# Patient Record
Sex: Female | Born: 1950
Health system: Southern US, Community
[De-identification: ages and names within clinical notes are randomized; demographics above are authoritative.]

## PROBLEM LIST (undated history)

## (undated) DIAGNOSIS — R002 Palpitations: Secondary | ICD-10-CM

## (undated) DIAGNOSIS — M1990 Unspecified osteoarthritis, unspecified site: Secondary | ICD-10-CM

## (undated) DIAGNOSIS — E785 Hyperlipidemia, unspecified: Secondary | ICD-10-CM

## (undated) DIAGNOSIS — Z923 Personal history of irradiation: Secondary | ICD-10-CM

## (undated) DIAGNOSIS — H9190 Unspecified hearing loss, unspecified ear: Secondary | ICD-10-CM

## (undated) DIAGNOSIS — K219 Gastro-esophageal reflux disease without esophagitis: Secondary | ICD-10-CM

## (undated) DIAGNOSIS — Z9119 Patient's noncompliance with other medical treatment and regimen: Secondary | ICD-10-CM

## (undated) DIAGNOSIS — C50412 Malignant neoplasm of upper-outer quadrant of left female breast: Secondary | ICD-10-CM

## (undated) DIAGNOSIS — K635 Polyp of colon: Secondary | ICD-10-CM

## (undated) DIAGNOSIS — E049 Nontoxic goiter, unspecified: Secondary | ICD-10-CM

## (undated) DIAGNOSIS — I2692 Saddle embolus of pulmonary artery without acute cor pulmonale: Secondary | ICD-10-CM

## (undated) DIAGNOSIS — G4733 Obstructive sleep apnea (adult) (pediatric): Secondary | ICD-10-CM

## (undated) DIAGNOSIS — I209 Angina pectoris, unspecified: Secondary | ICD-10-CM

## (undated) DIAGNOSIS — I10 Essential (primary) hypertension: Secondary | ICD-10-CM

## (undated) HISTORY — DX: Hyperlipidemia, unspecified: E78.5

## (undated) HISTORY — DX: Patient's noncompliance with other medical treatment and regimen: Z91.19

## (undated) HISTORY — DX: Morbid (severe) obesity due to excess calories: E66.01

## (undated) HISTORY — DX: Gastro-esophageal reflux disease without esophagitis: K21.9

## (undated) HISTORY — DX: Saddle embolus of pulmonary artery without acute cor pulmonale: I26.92

## (undated) HISTORY — DX: Palpitations: R00.2

## (undated) HISTORY — PX: TUBAL LIGATION: SHX77

## (undated) HISTORY — DX: Malignant neoplasm of upper-outer quadrant of left female breast: C50.412

## (undated) HISTORY — DX: Obstructive sleep apnea (adult) (pediatric): G47.33

## (undated) HISTORY — DX: Polyp of colon: K63.5

---

## 1998-09-13 ENCOUNTER — Ambulatory Visit (HOSPITAL_COMMUNITY): Admission: RE | Admit: 1998-09-13 | Discharge: 1998-09-13 | Payer: Self-pay | Admitting: Cardiology

## 1998-09-14 ENCOUNTER — Encounter: Payer: Self-pay | Admitting: Cardiology

## 1999-11-21 ENCOUNTER — Emergency Department (HOSPITAL_COMMUNITY): Admission: EM | Admit: 1999-11-21 | Discharge: 1999-11-22 | Payer: Self-pay | Admitting: Emergency Medicine

## 2001-07-03 ENCOUNTER — Ambulatory Visit (HOSPITAL_COMMUNITY): Admission: RE | Admit: 2001-07-03 | Discharge: 2001-07-03 | Payer: Self-pay | Admitting: Cardiology

## 2001-07-04 ENCOUNTER — Encounter: Payer: Self-pay | Admitting: Cardiology

## 2001-07-24 ENCOUNTER — Ambulatory Visit (HOSPITAL_COMMUNITY): Admission: RE | Admit: 2001-07-24 | Discharge: 2001-07-24 | Payer: Self-pay | Admitting: Cardiology

## 2001-11-16 ENCOUNTER — Emergency Department (HOSPITAL_COMMUNITY): Admission: EM | Admit: 2001-11-16 | Discharge: 2001-11-16 | Payer: Self-pay | Admitting: Emergency Medicine

## 2002-04-02 ENCOUNTER — Emergency Department (HOSPITAL_COMMUNITY): Admission: EM | Admit: 2002-04-02 | Discharge: 2002-04-02 | Payer: Self-pay | Admitting: Emergency Medicine

## 2002-04-02 ENCOUNTER — Encounter: Payer: Self-pay | Admitting: Emergency Medicine

## 2002-04-15 ENCOUNTER — Emergency Department (HOSPITAL_COMMUNITY): Admission: EM | Admit: 2002-04-15 | Discharge: 2002-04-15 | Payer: Self-pay | Admitting: *Deleted

## 2002-07-28 ENCOUNTER — Ambulatory Visit (HOSPITAL_COMMUNITY): Admission: RE | Admit: 2002-07-28 | Discharge: 2002-07-28 | Payer: Self-pay | Admitting: Family Medicine

## 2002-07-28 ENCOUNTER — Encounter: Payer: Self-pay | Admitting: Family Medicine

## 2003-05-25 ENCOUNTER — Encounter: Payer: Self-pay | Admitting: Emergency Medicine

## 2003-05-25 ENCOUNTER — Inpatient Hospital Stay (HOSPITAL_COMMUNITY): Admission: EM | Admit: 2003-05-25 | Discharge: 2003-05-27 | Payer: Self-pay | Admitting: Emergency Medicine

## 2003-05-27 ENCOUNTER — Encounter (INDEPENDENT_AMBULATORY_CARE_PROVIDER_SITE_OTHER): Payer: Self-pay | Admitting: Cardiology

## 2003-05-27 ENCOUNTER — Encounter: Payer: Self-pay | Admitting: Internal Medicine

## 2003-08-16 ENCOUNTER — Emergency Department (HOSPITAL_COMMUNITY): Admission: EM | Admit: 2003-08-16 | Discharge: 2003-08-16 | Payer: Self-pay | Admitting: Emergency Medicine

## 2003-12-02 IMAGING — CR DG CHEST 2V
2 series · 2 of 2 positions shown · non-contrast
Comparison: Two view chest x-ray [DATE].

CLINICAL DATA: Chest pain.  Shortness of breath. 
CHEST (TWO VIEWS) [DATE]

[view not recorded (1 of 2)]
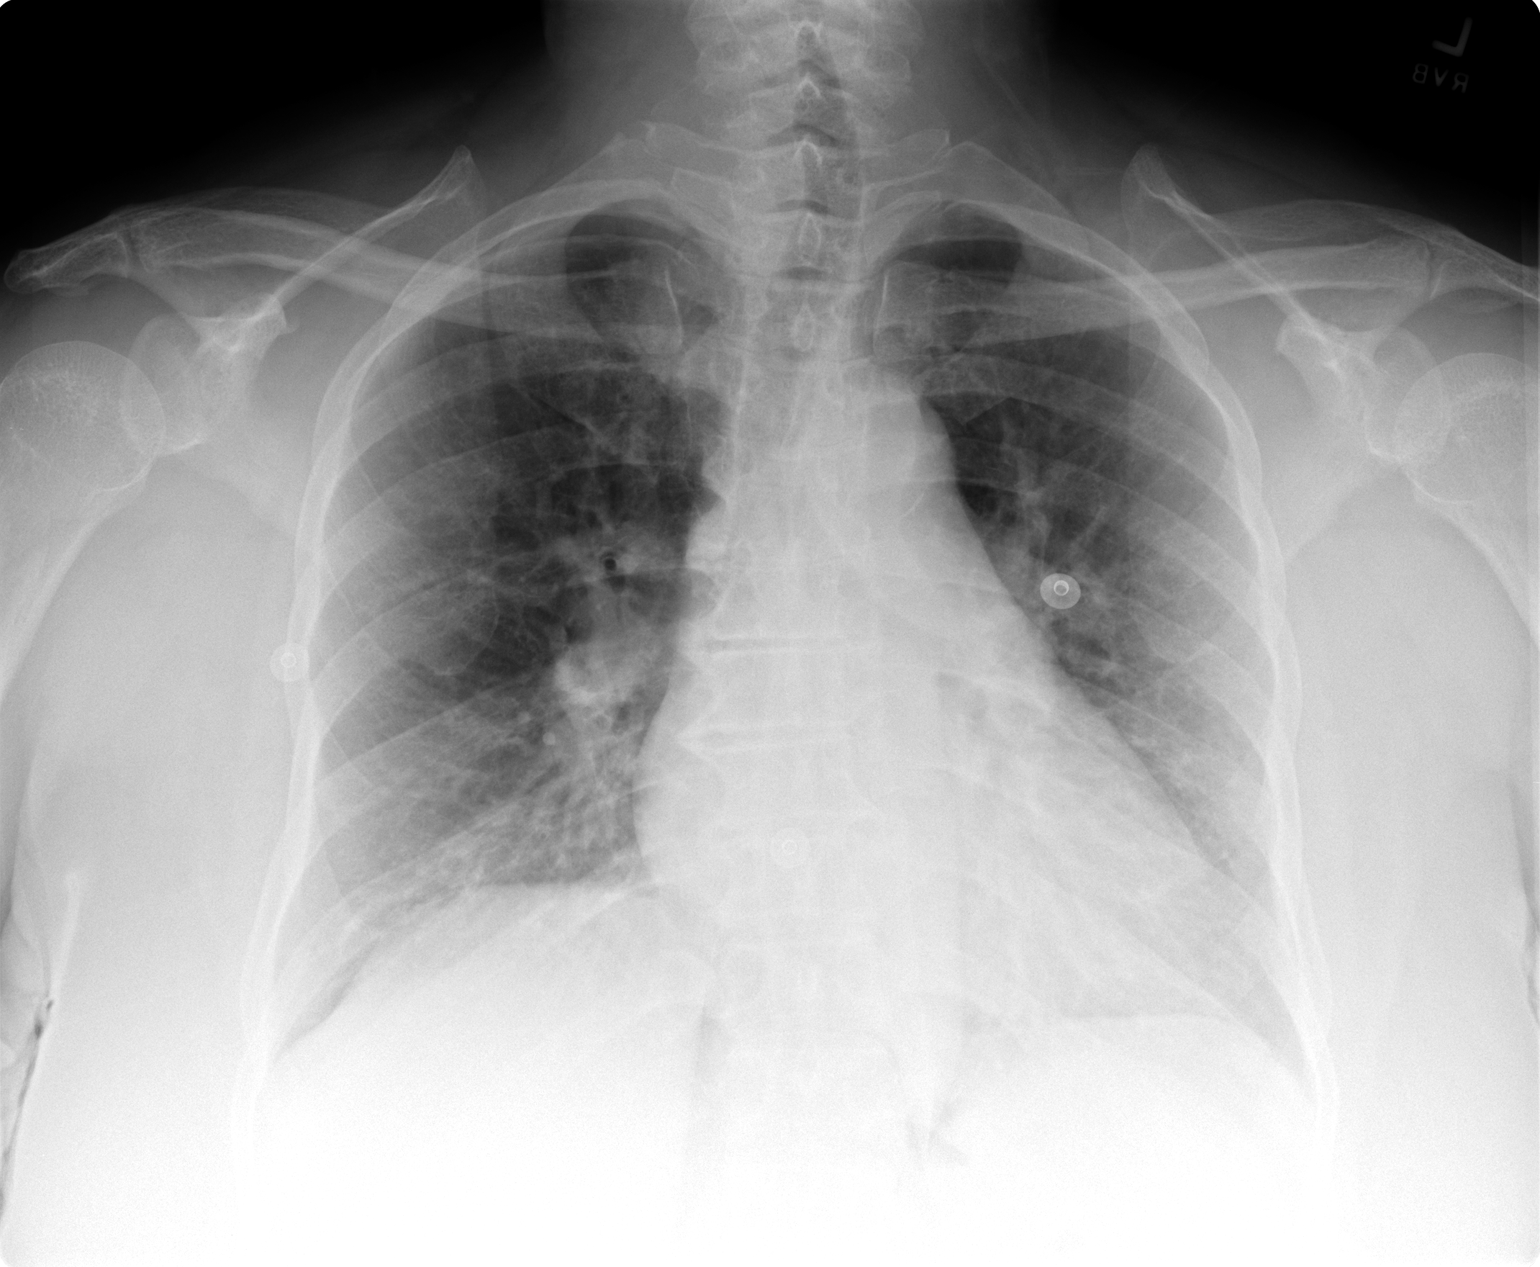

[view not recorded (2 of 2)]
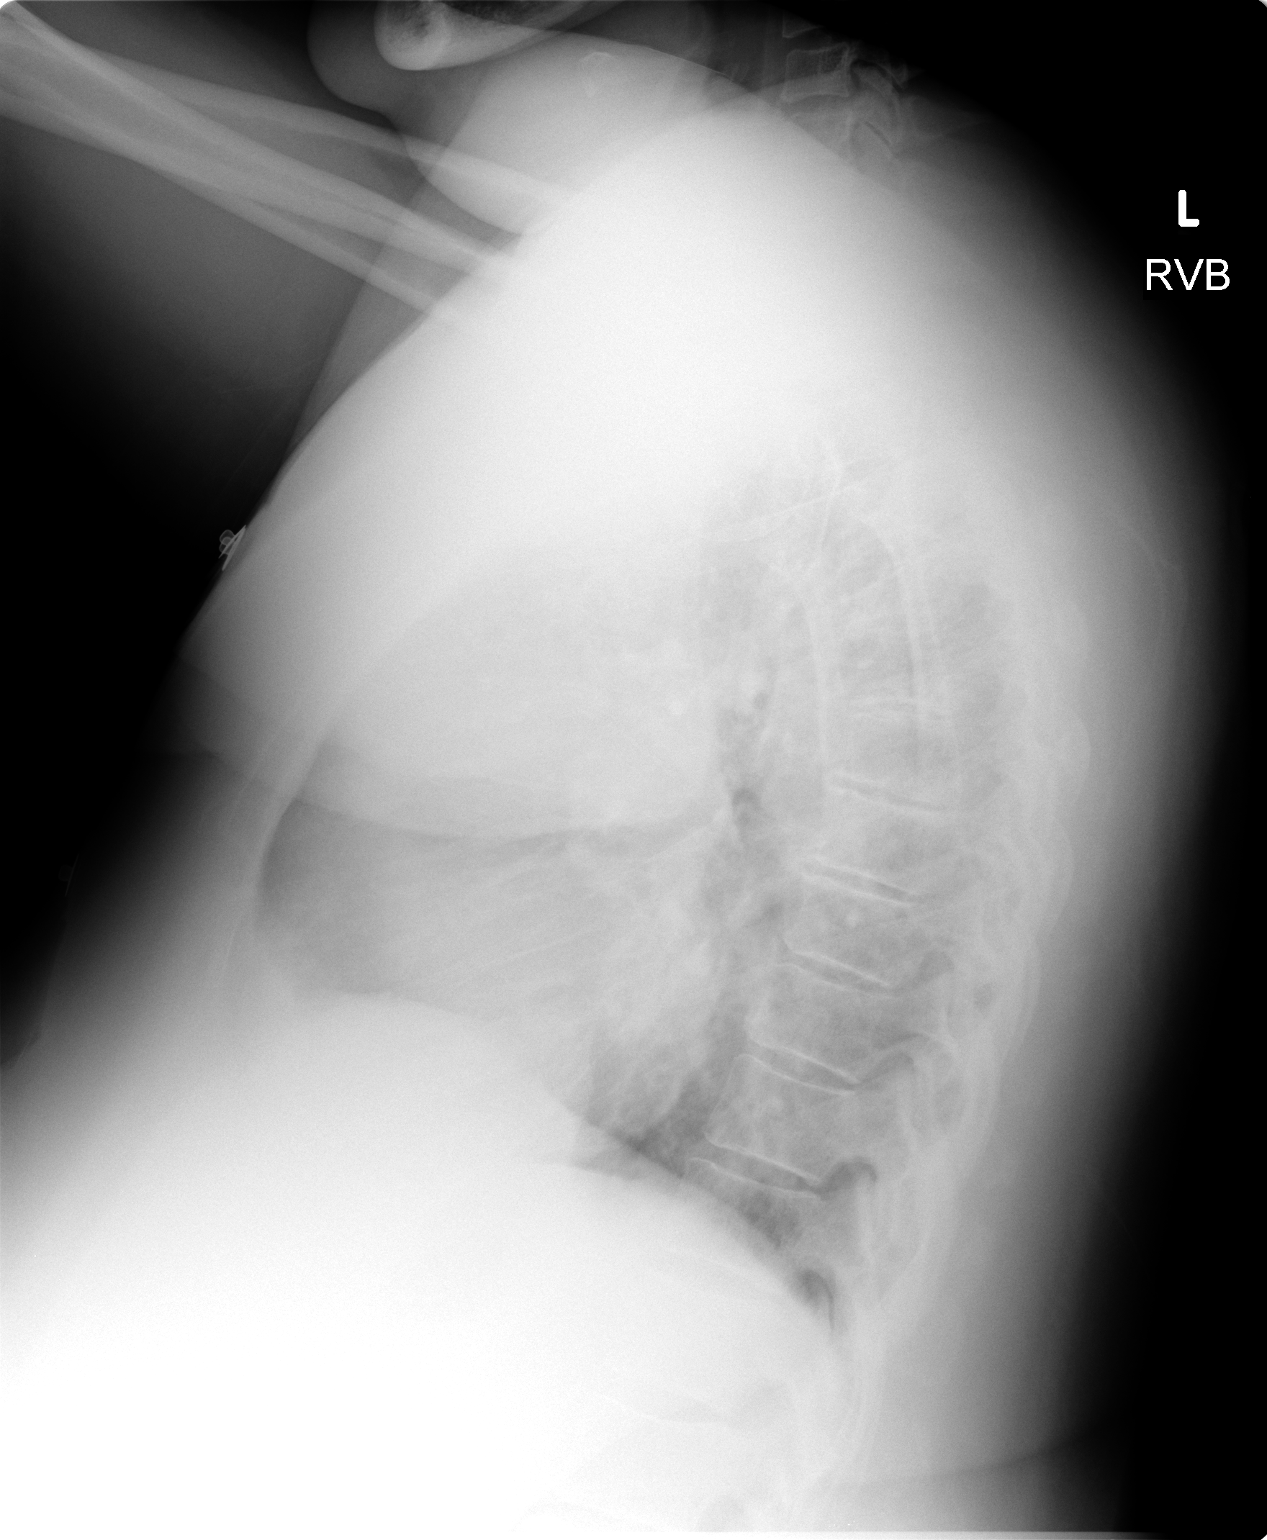

[2 of 2 positions shown; findings below may reference images not displayed]

FINDINGS: The heart is mildly enlarged.  Pulmonary venous hypertension is present without overt edema.  There are no confluent areas of consolidation.  There are no pleural effusions.  Degenerative changes are again noted in the thoracic spine. 
IMPRESSION
Mild cardiomegaly.  Pulmonary venous hypertension without overt edema.

## 2004-01-26 ENCOUNTER — Ambulatory Visit (HOSPITAL_COMMUNITY): Admission: RE | Admit: 2004-01-26 | Discharge: 2004-01-26 | Payer: Self-pay | Admitting: Family Medicine

## 2004-02-03 ENCOUNTER — Encounter: Admission: RE | Admit: 2004-02-03 | Discharge: 2004-02-03 | Payer: Self-pay | Admitting: Family Medicine

## 2004-02-20 ENCOUNTER — Emergency Department (HOSPITAL_COMMUNITY): Admission: EM | Admit: 2004-02-20 | Discharge: 2004-02-20 | Payer: Self-pay | Admitting: Emergency Medicine

## 2004-02-20 IMAGING — CR DG CHEST 2V
2 series · 2 of 2 positions shown · non-contrast
Comparison: [DATE].

CLINICAL DATA: Left chest pain for the past week. 
 PA AND LATERAL CHEST

[view not recorded (1 of 2)]
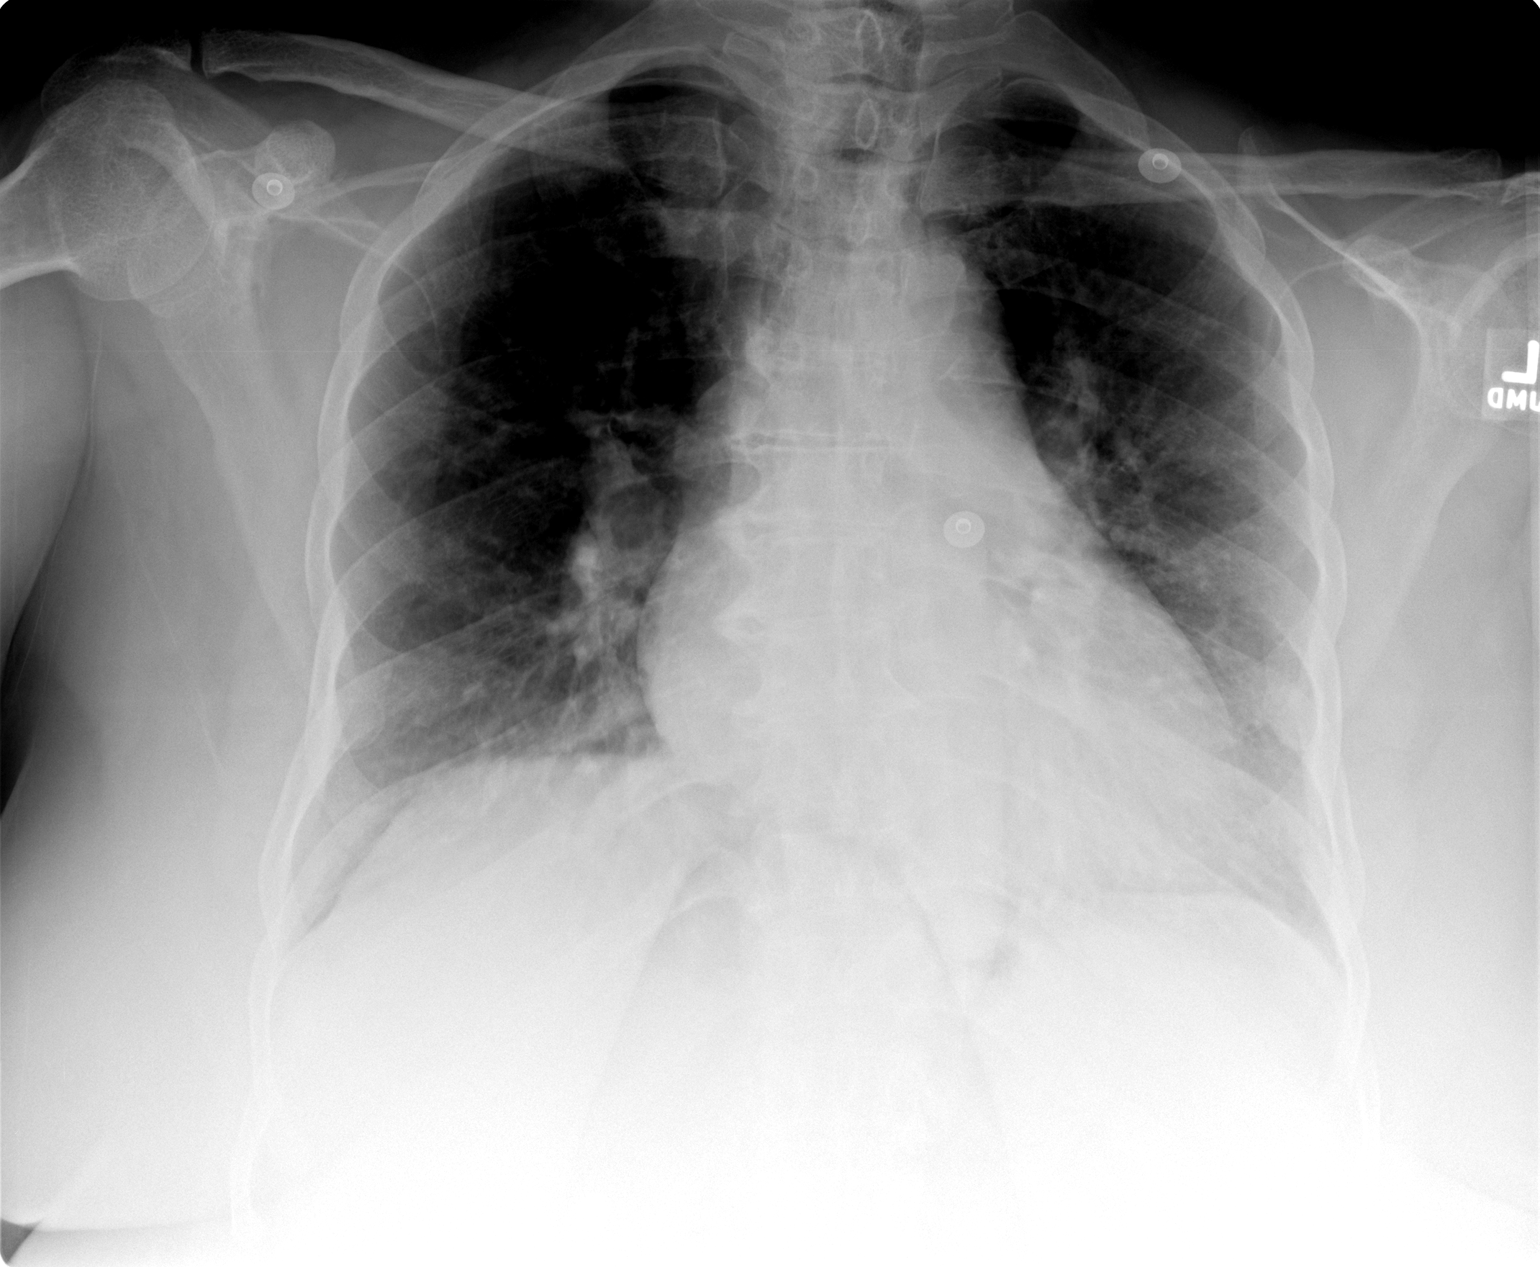

[view not recorded (2 of 2)]
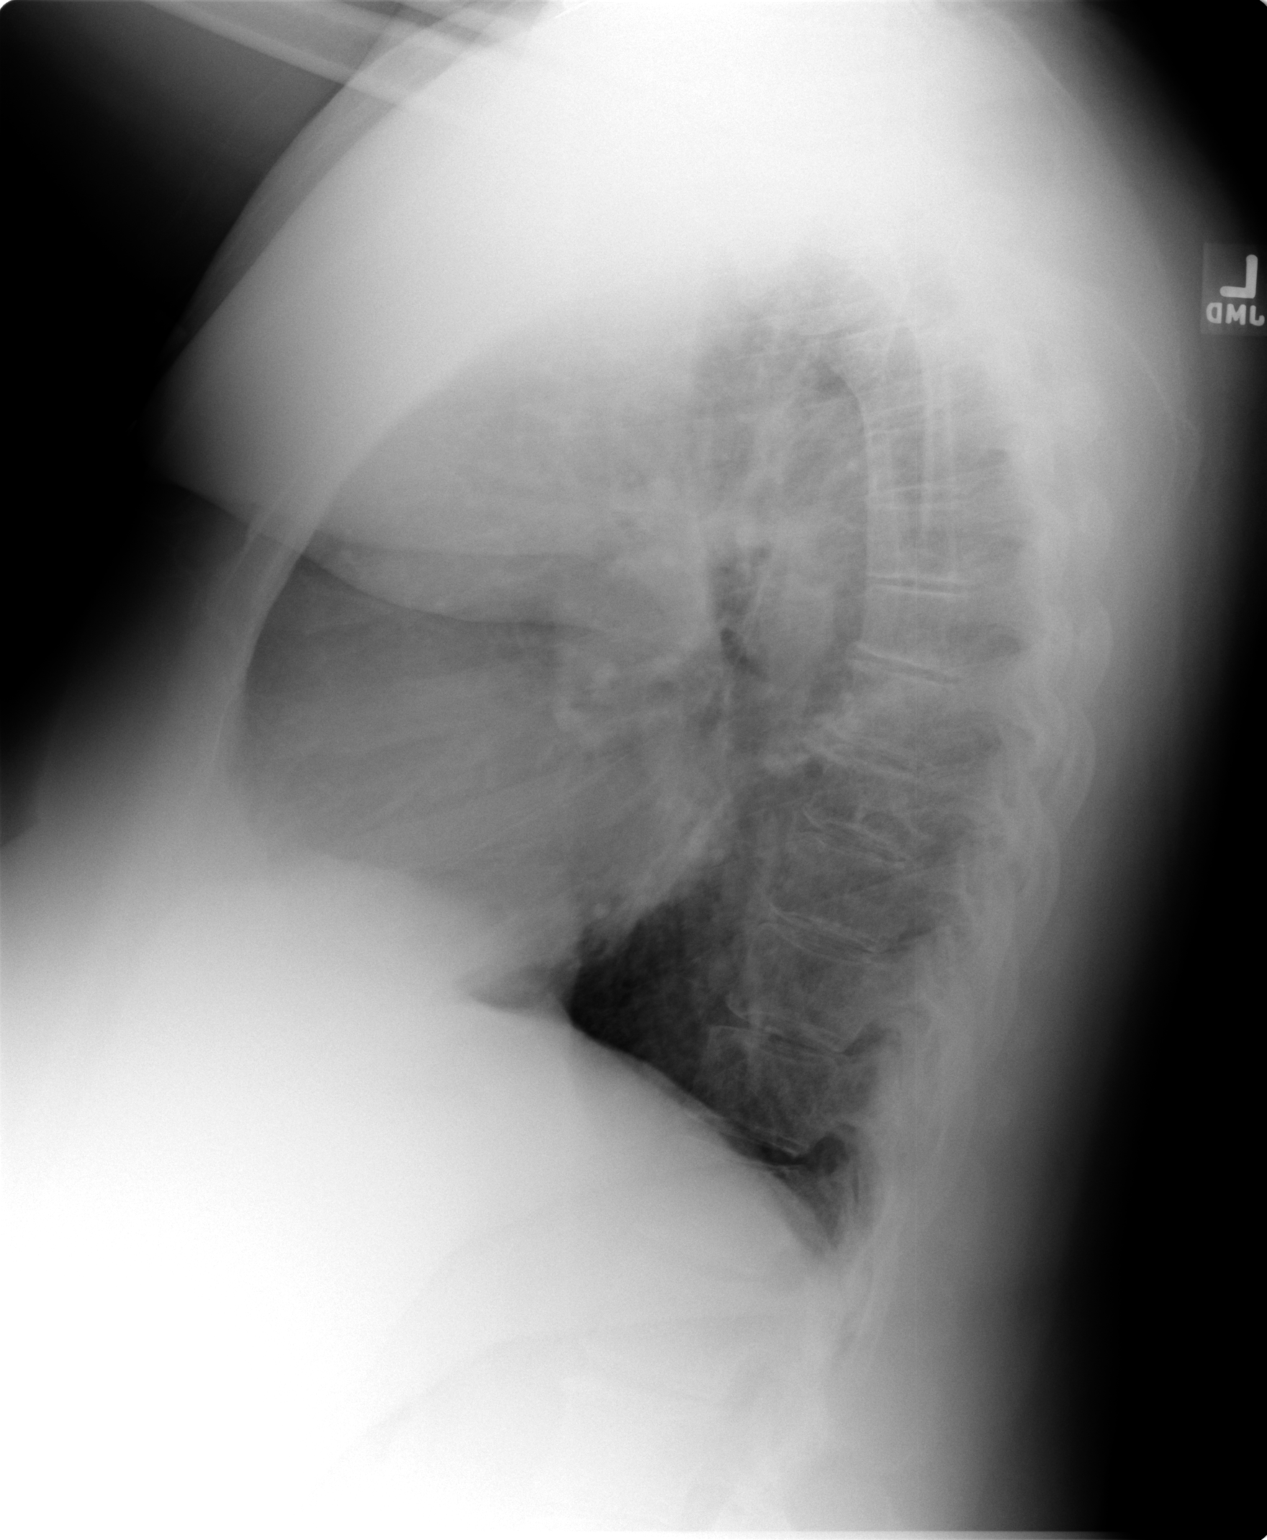

[2 of 2 positions shown; findings below may reference images not displayed]

FINDINGS: Cardiomegaly.  Central pulmonary vascular prominence.  No segmental infiltrate or congestive heart failure.  If thoracic aortic abnormality or pulmonary embolus were of high clinical concern, CT of the chest may be considered for further delineation.  This would also help to evaluate for the possibility of hilar adenopathy as the pulmonary vascular prominence limits plain film detection of such.  
 IMPRESSION
 Cardiomegaly and central pulmonary vascular prominence as discussed above.

## 2004-04-29 ENCOUNTER — Emergency Department (HOSPITAL_COMMUNITY): Admission: EM | Admit: 2004-04-29 | Discharge: 2004-04-29 | Payer: Self-pay | Admitting: Emergency Medicine

## 2004-05-10 ENCOUNTER — Emergency Department (HOSPITAL_COMMUNITY): Admission: EM | Admit: 2004-05-10 | Discharge: 2004-05-10 | Payer: Self-pay | Admitting: Emergency Medicine

## 2004-05-10 IMAGING — CR DG CHEST 1V PORT
1 series · 1 of 1 positions shown · non-contrast
Comparison: none

CLINICAL DATA: Chest pain. 
 PORTABLE CHEST, ONE VIEW 
 AP view of the chest at [MB] hours is compared to views of [DATE] reveals the heart size to be prominent.  Pulmonary vascularity remains accentuated without definite pulmonary edema.  
 IMPRESSION 
 Heart size is prominent.  
 No active disease.

[view not recorded]
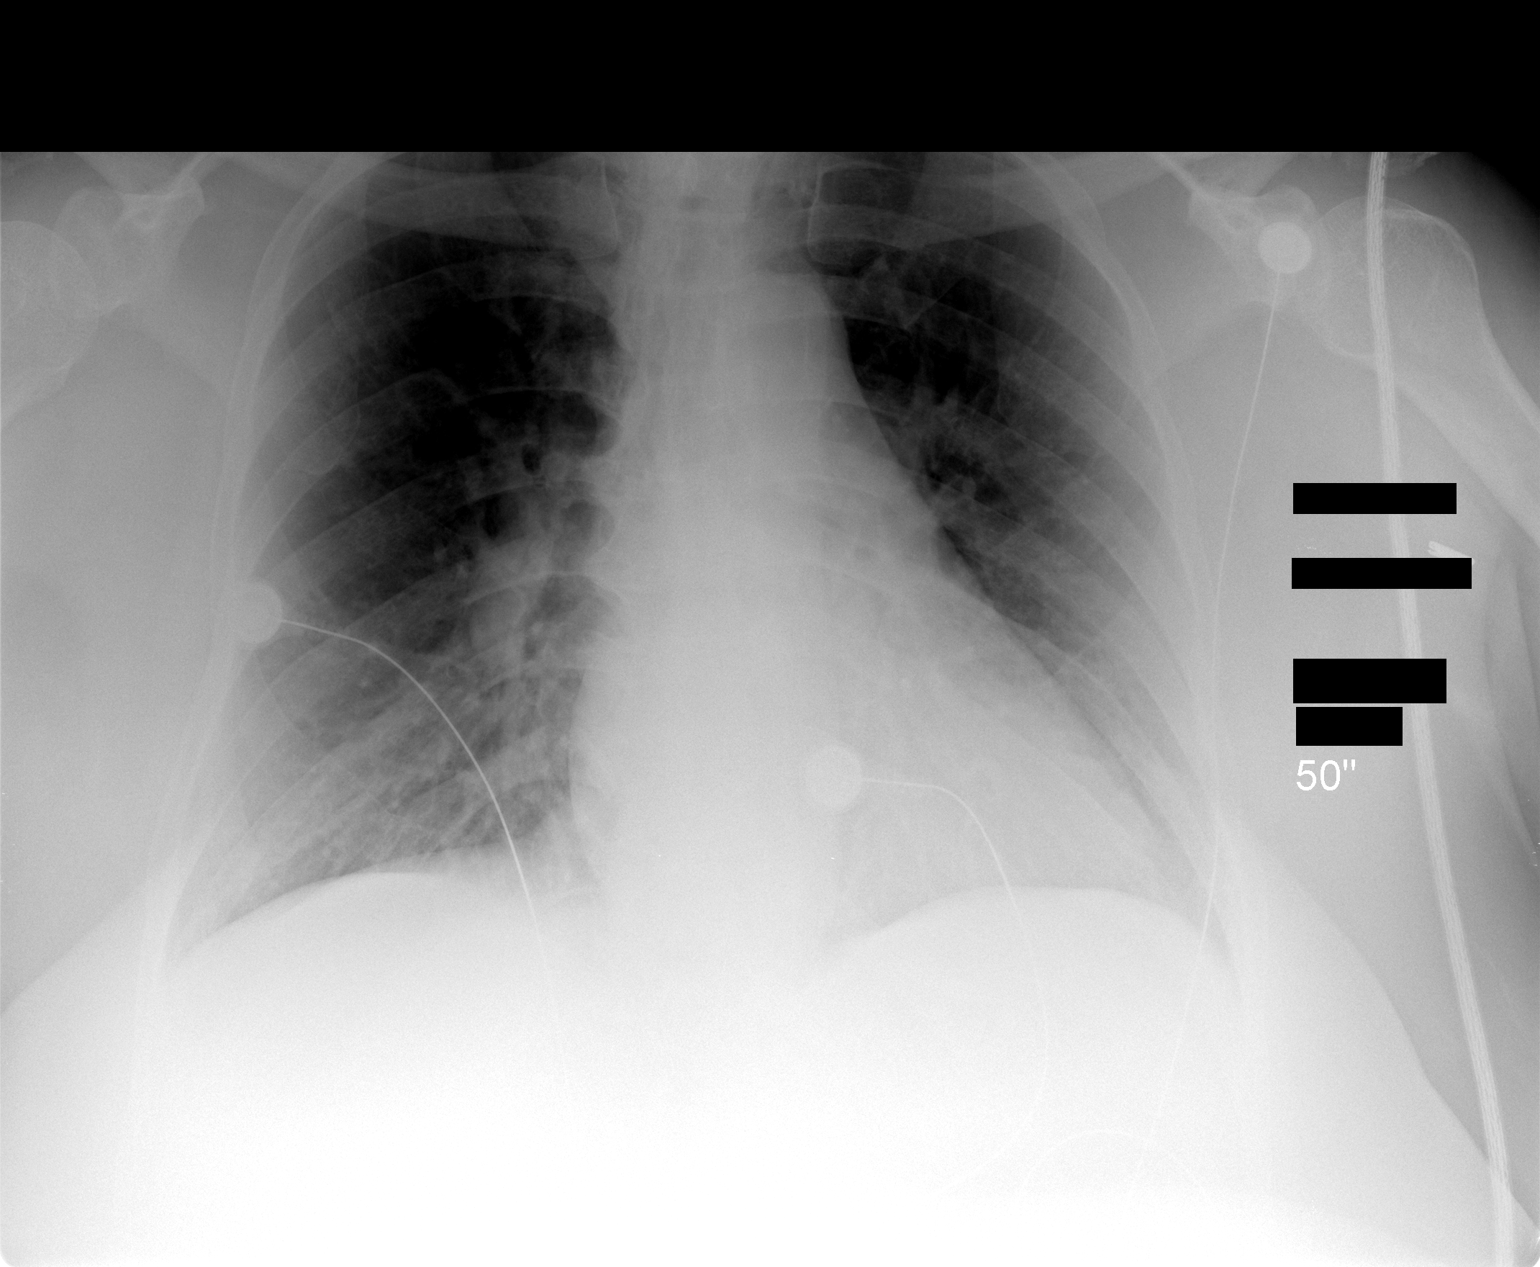

[1 of 1 positions shown; findings below may reference images not displayed]

## 2004-05-30 ENCOUNTER — Ambulatory Visit: Payer: Self-pay | Admitting: Family Medicine

## 2004-06-16 ENCOUNTER — Ambulatory Visit: Payer: Self-pay | Admitting: Family Medicine

## 2004-06-16 ENCOUNTER — Ambulatory Visit: Payer: Self-pay | Admitting: *Deleted

## 2004-06-26 ENCOUNTER — Ambulatory Visit: Payer: Self-pay | Admitting: Family Medicine

## 2004-07-24 ENCOUNTER — Encounter: Admission: RE | Admit: 2004-07-24 | Discharge: 2004-07-24 | Payer: Self-pay | Admitting: Family Medicine

## 2004-08-14 ENCOUNTER — Ambulatory Visit: Payer: Self-pay | Admitting: Family Medicine

## 2005-03-26 ENCOUNTER — Ambulatory Visit: Payer: Self-pay | Admitting: Family Medicine

## 2005-04-09 ENCOUNTER — Encounter: Admission: RE | Admit: 2005-04-09 | Discharge: 2005-04-09 | Payer: Self-pay | Admitting: Family Medicine

## 2005-04-23 ENCOUNTER — Ambulatory Visit: Payer: Self-pay | Admitting: Family Medicine

## 2005-04-25 ENCOUNTER — Ambulatory Visit: Payer: Self-pay | Admitting: Family Medicine

## 2005-10-05 ENCOUNTER — Ambulatory Visit: Payer: Self-pay | Admitting: Family Medicine

## 2006-04-12 ENCOUNTER — Encounter: Admission: RE | Admit: 2006-04-12 | Discharge: 2006-04-12 | Payer: Self-pay | Admitting: Family Medicine

## 2006-04-12 IMAGING — MG MM SCREEN MAMMOGRAM BILATERAL
8 of 12 series · 8 of 12 positions shown · non-contrast
Comparison: none

DG SCREEN MAMMOGRAM BILATERAL
Bilateral CC and MLO view(s) were taken.

SCREENING MAMMOGRAM:
There is a fibrofatty pattern.  No masses or malignant type calcifications are identified.  
Compared with prior studies.

[R CC (1 of 3)]
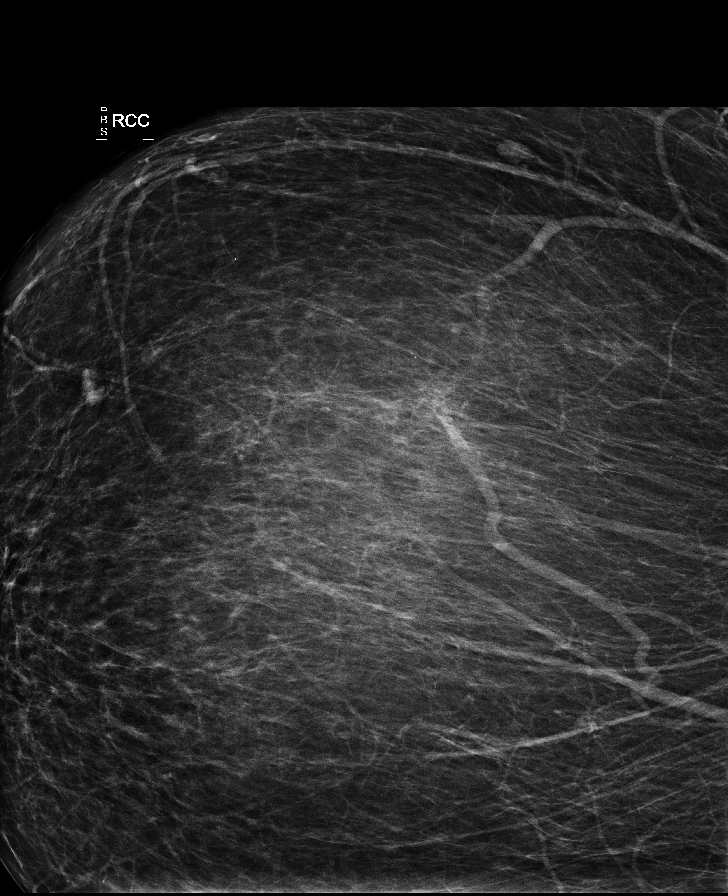

[L CC (1 of 3)]
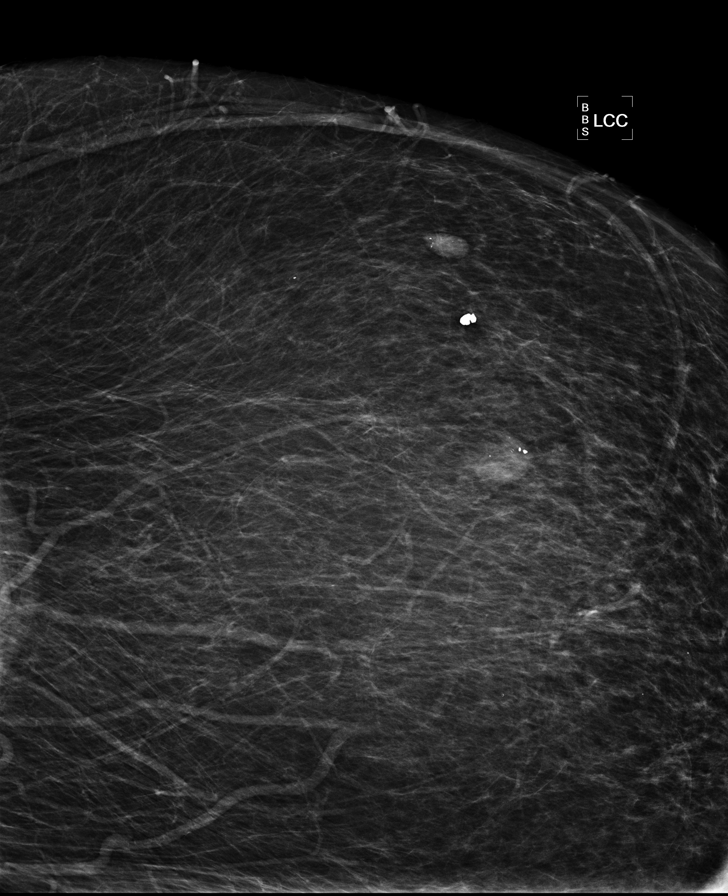

[L MLO]
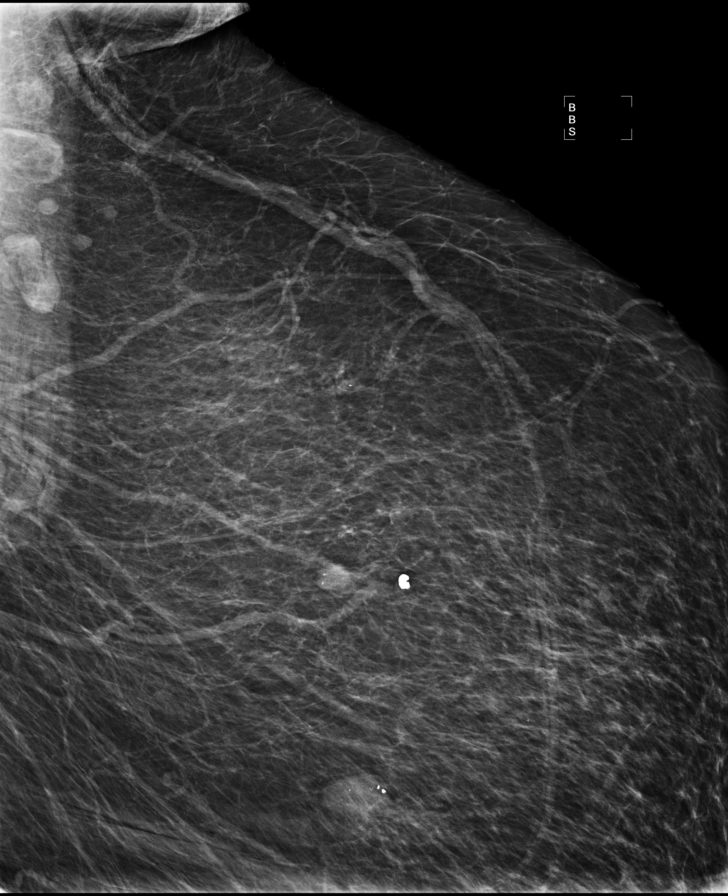

[R MLO]
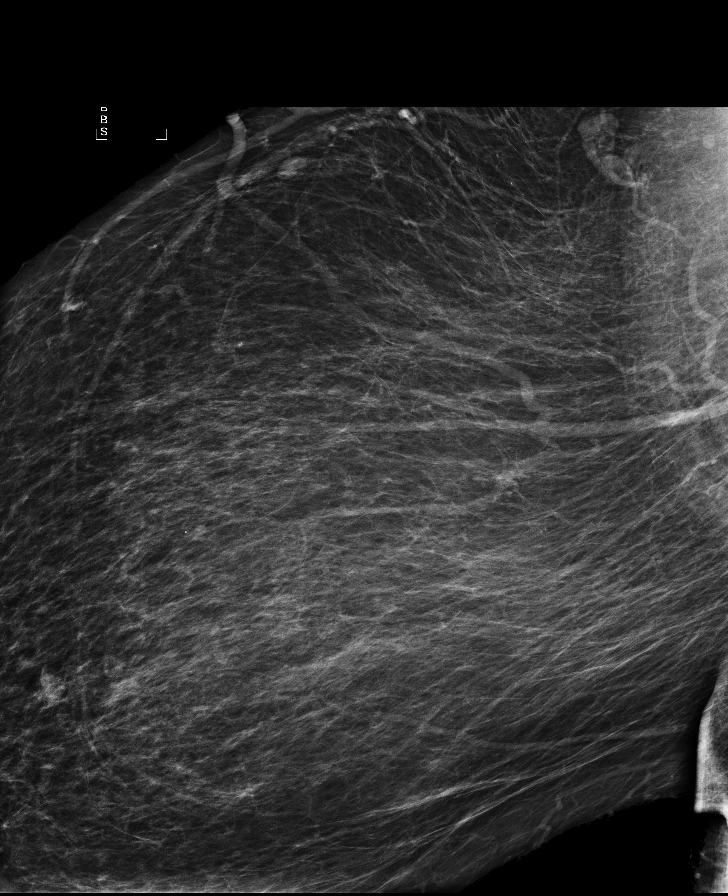

[L CC (2 of 3)]
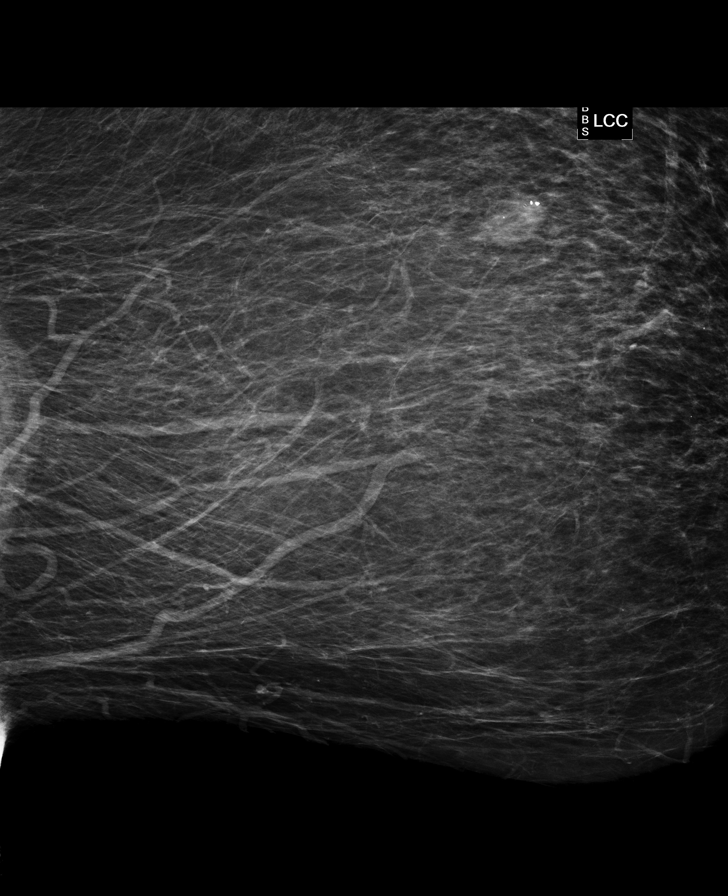

[L CC (3 of 3)]
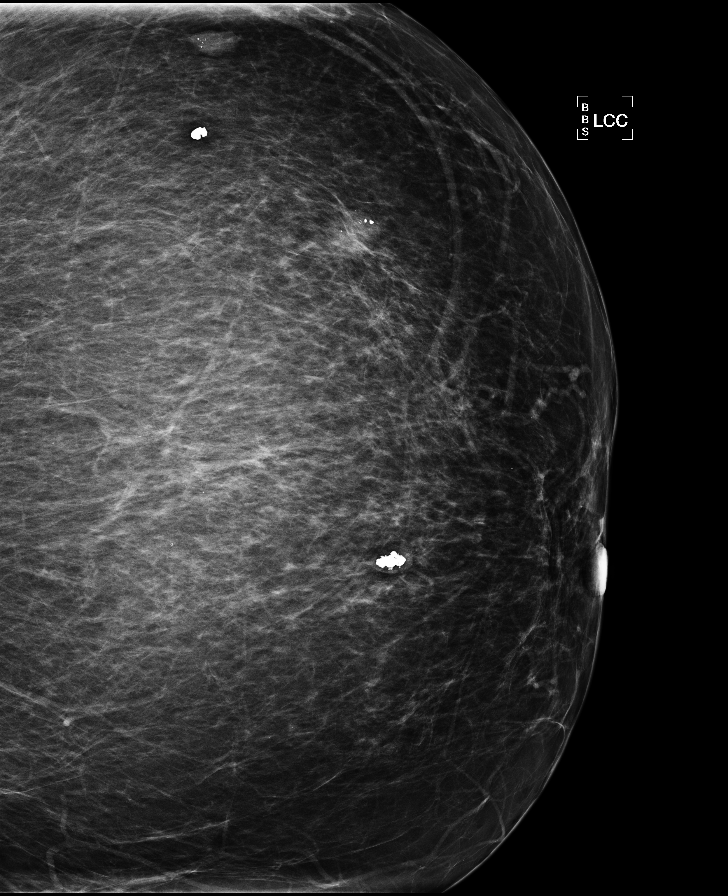

[R CC (2 of 3)]
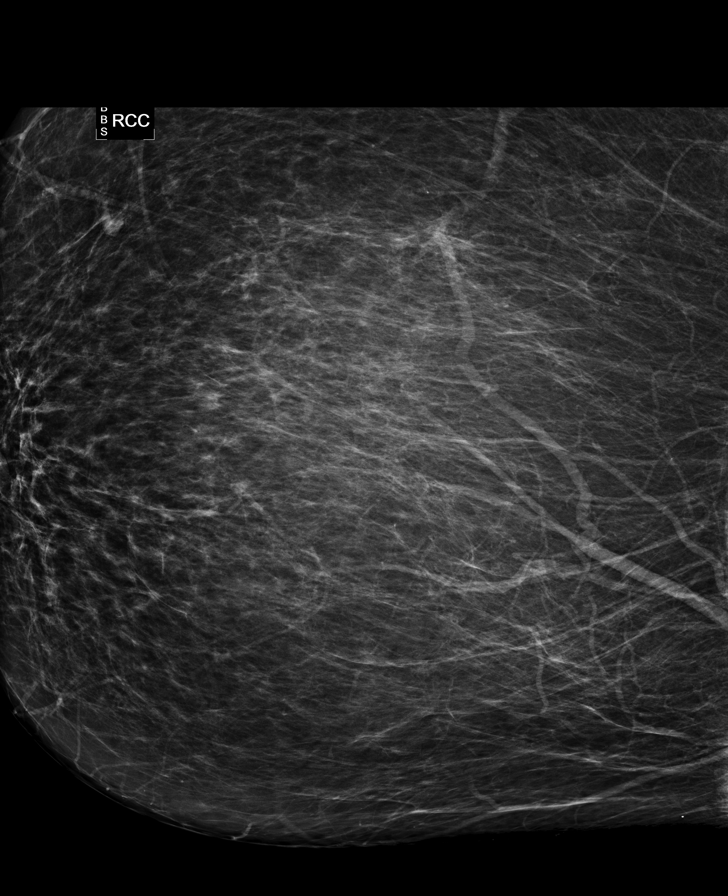

[R CC (3 of 3)]
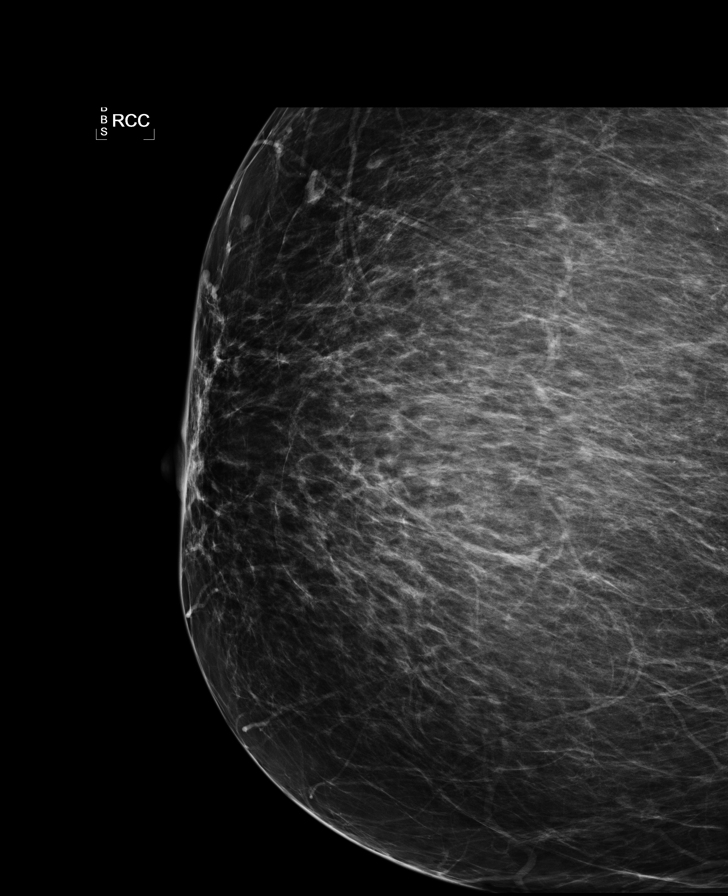

[8 of 12 positions shown; findings below may reference images not displayed]

IMPRESSION: No specific mammographic evidence of malignancy.  Next screening mammogram is recommended in one 
year.

ASSESSMENT: Negative - BI-RADS 1

Screening mammogram in 1 year.
ANALYZED BY COMPUTER AIDED DETECTION. , THIS PROCEDURE WAS A DIGITAL MAMMOGRAM.

## 2006-05-01 ENCOUNTER — Ambulatory Visit: Payer: Self-pay | Admitting: Family Medicine

## 2006-06-27 ENCOUNTER — Emergency Department (HOSPITAL_COMMUNITY): Admission: EM | Admit: 2006-06-27 | Discharge: 2006-06-27 | Payer: Self-pay | Admitting: Emergency Medicine

## 2006-06-27 IMAGING — CR DG LUMBAR SPINE COMPLETE 4+V
6 series · 6 of 6 positions shown · non-contrast
Comparison: none

CLINICAL DATA: Leg pain. Back pain.
LUMBAR SPINE ? 6 VIEW:

[t l-spine a.p. *]
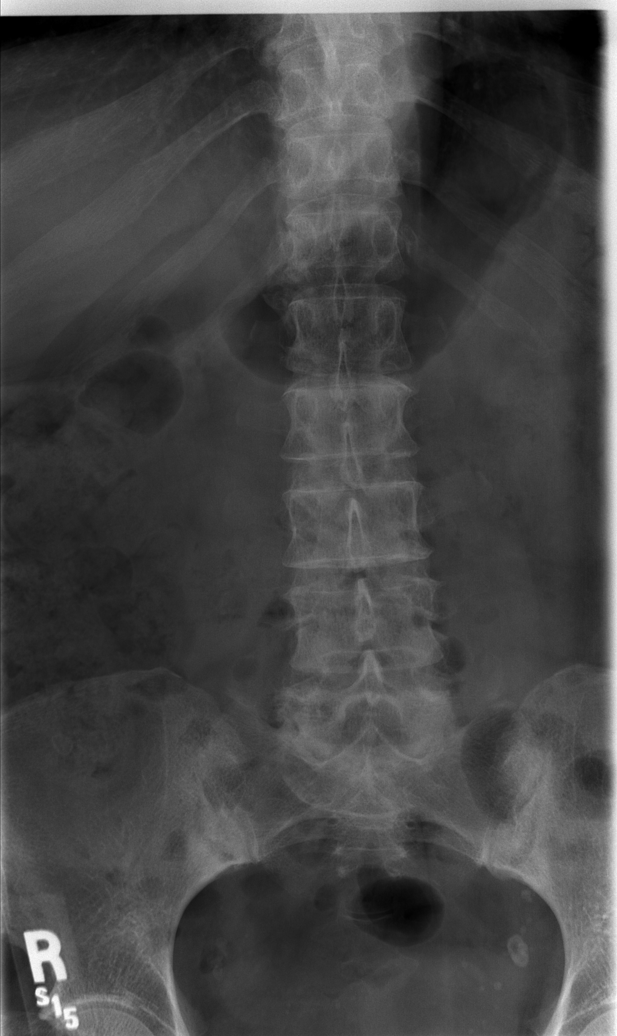

[t l-spine oblique exposure * (1 of 2)]
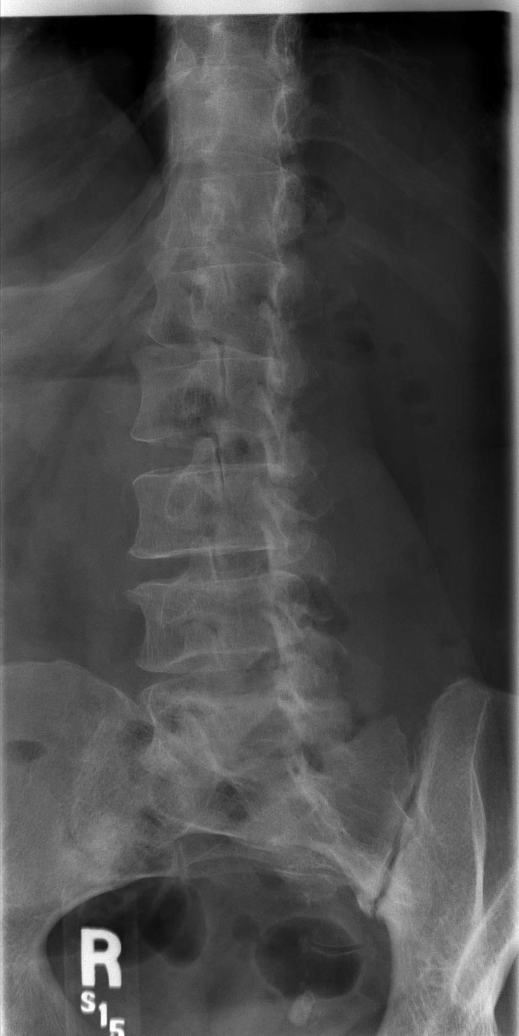

[t l-spine oblique exposure * (2 of 2)]
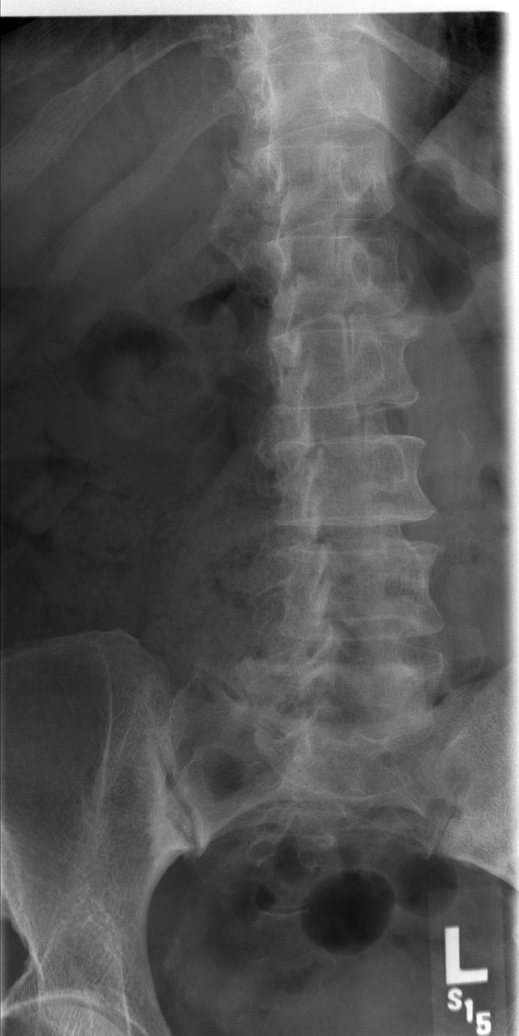

[t l-spine lat * (1 of 2)]
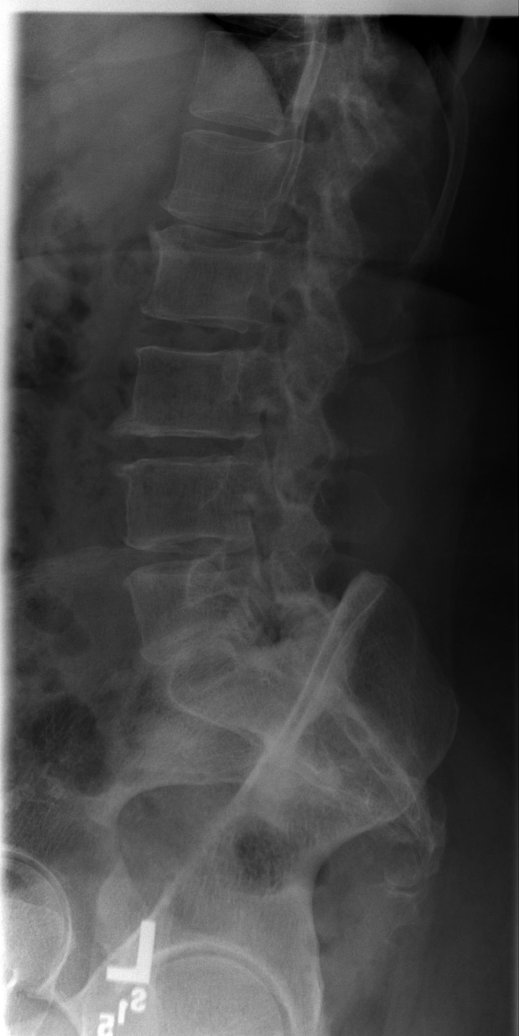

[t l-spine lat * (2 of 2)]
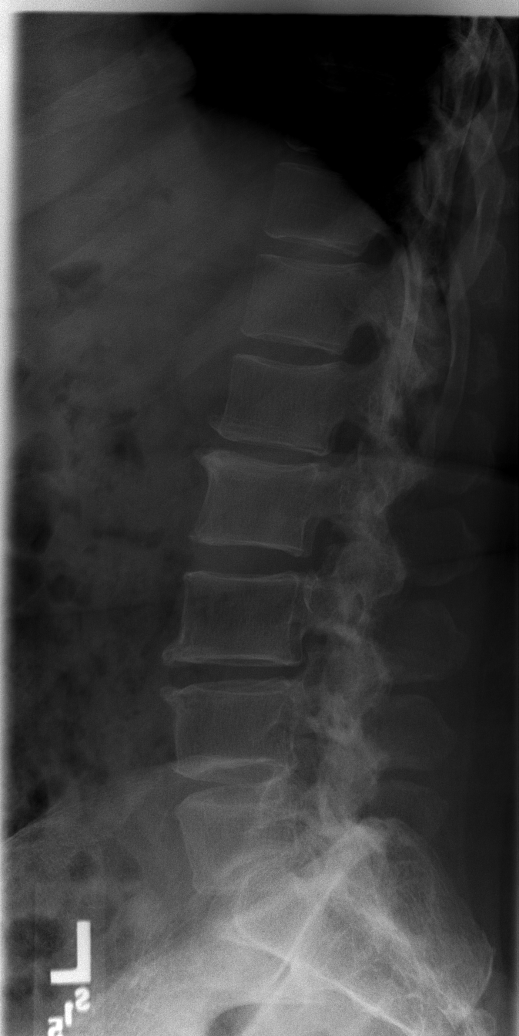

[t l-spine l5-s1 spot *]
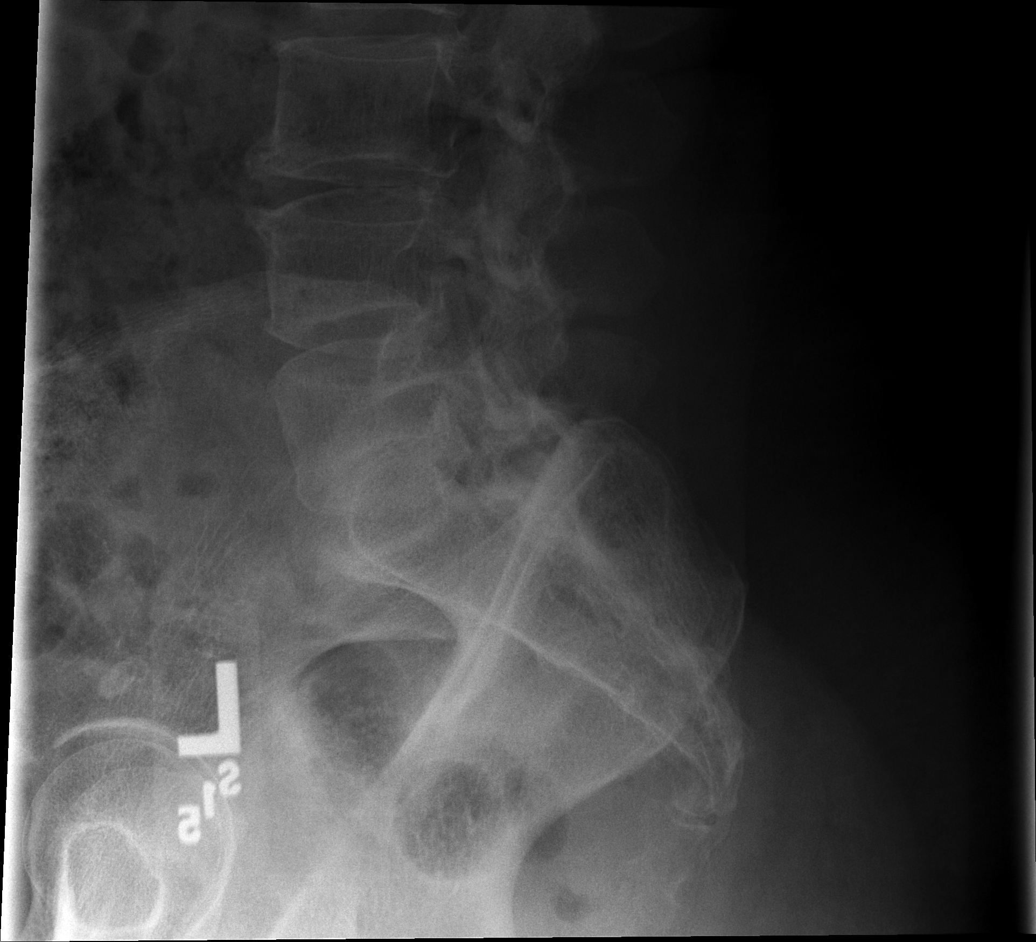

[6 of 6 positions shown; findings below may reference images not displayed]

FINDINGS: Degenerative changes SI joints. Disk space narrowing and vertebral body osteophytic formation L1-2 and L3-4. Negative for pars defects or spondylolisthesis. No compression fractures. Suggestion of a generous amount of stool in the colon.
IMPRESSION: Degenerative changes SI joints in lumbar spine. No acute L-spine abnormality.

## 2006-07-16 ENCOUNTER — Ambulatory Visit: Payer: Self-pay | Admitting: Family Medicine

## 2006-07-18 ENCOUNTER — Emergency Department (HOSPITAL_COMMUNITY): Admission: EM | Admit: 2006-07-18 | Discharge: 2006-07-18 | Payer: Self-pay | Admitting: Emergency Medicine

## 2006-07-18 ENCOUNTER — Encounter: Payer: Self-pay | Admitting: Vascular Surgery

## 2006-07-24 ENCOUNTER — Ambulatory Visit: Payer: Self-pay | Admitting: Internal Medicine

## 2006-12-02 ENCOUNTER — Ambulatory Visit: Payer: Self-pay | Admitting: Family Medicine

## 2007-05-28 ENCOUNTER — Encounter (INDEPENDENT_AMBULATORY_CARE_PROVIDER_SITE_OTHER): Payer: Self-pay | Admitting: *Deleted

## 2007-07-18 ENCOUNTER — Ambulatory Visit: Payer: Self-pay | Admitting: Family Medicine

## 2007-07-18 LAB — CONVERTED CEMR LAB
Calcium: 9.9 mg/dL (ref 8.4–10.5)
Creatinine, Ser: 0.76 mg/dL (ref 0.40–1.20)

## 2007-08-20 ENCOUNTER — Ambulatory Visit (HOSPITAL_COMMUNITY): Admission: RE | Admit: 2007-08-20 | Discharge: 2007-08-20 | Payer: Self-pay | Admitting: Family Medicine

## 2007-08-20 ENCOUNTER — Encounter: Admission: RE | Admit: 2007-08-20 | Discharge: 2007-08-20 | Payer: Self-pay | Admitting: Family Medicine

## 2007-08-20 IMAGING — MG MM SCREEN MAMMOGRAM BILATERAL
8 of 11 series · 8 of 11 positions shown · non-contrast
Comparison: none

DG SCREEN MAMMOGRAM BILATERAL
Bilateral CC and MLO view(s) were taken.

DIGITAL SCREENING MAMMOGRAM WITH CAD:
There are scattered fibroglandular densities.  No masses or malignant type calcifications are 
identified.  Compared with prior studies.

[R CC (1 of 2)]
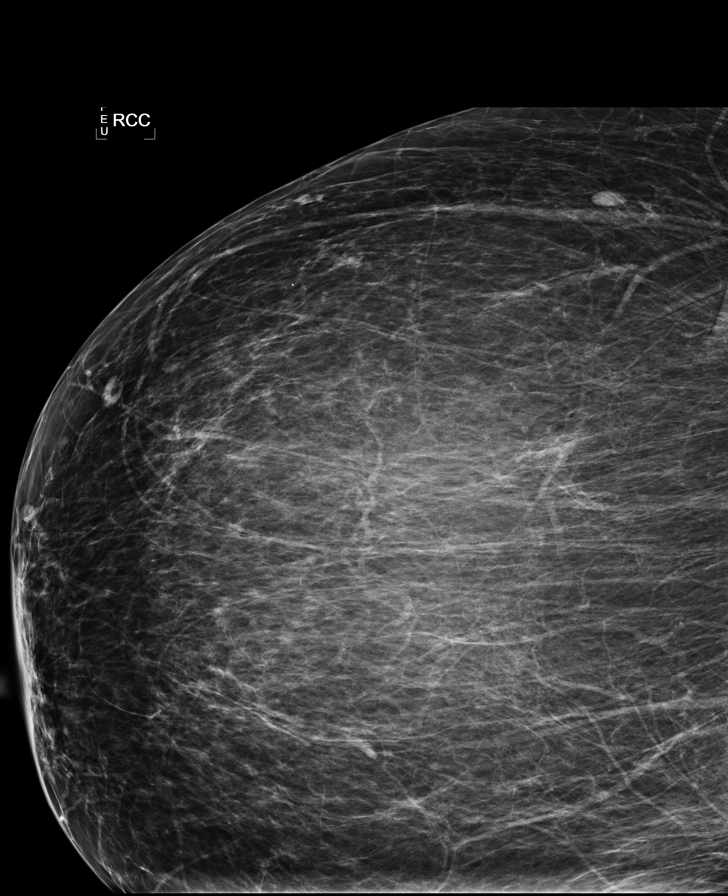

[L CC]
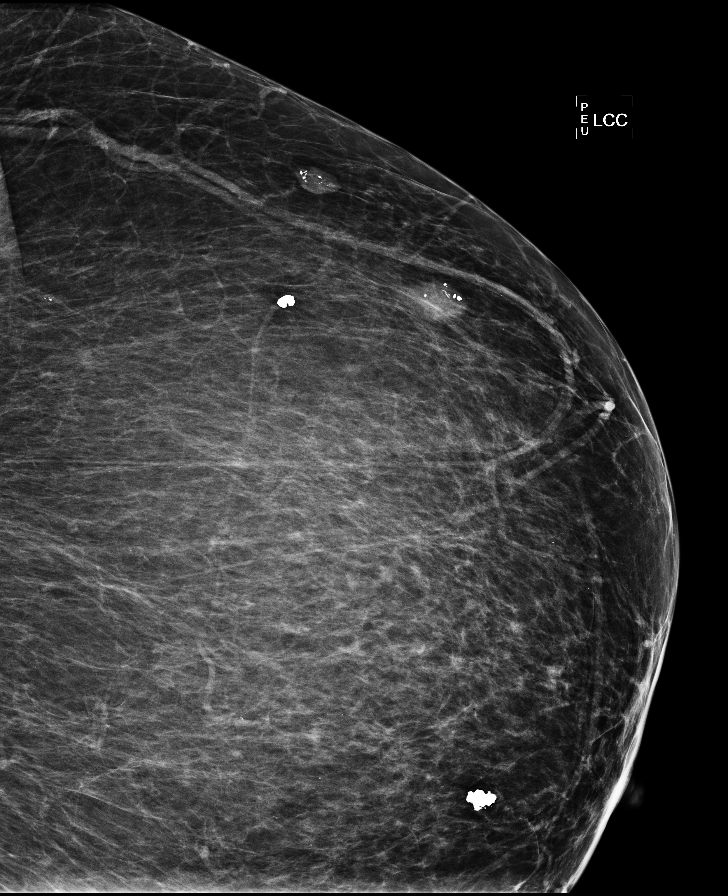

[L MLO (1 of 2)]
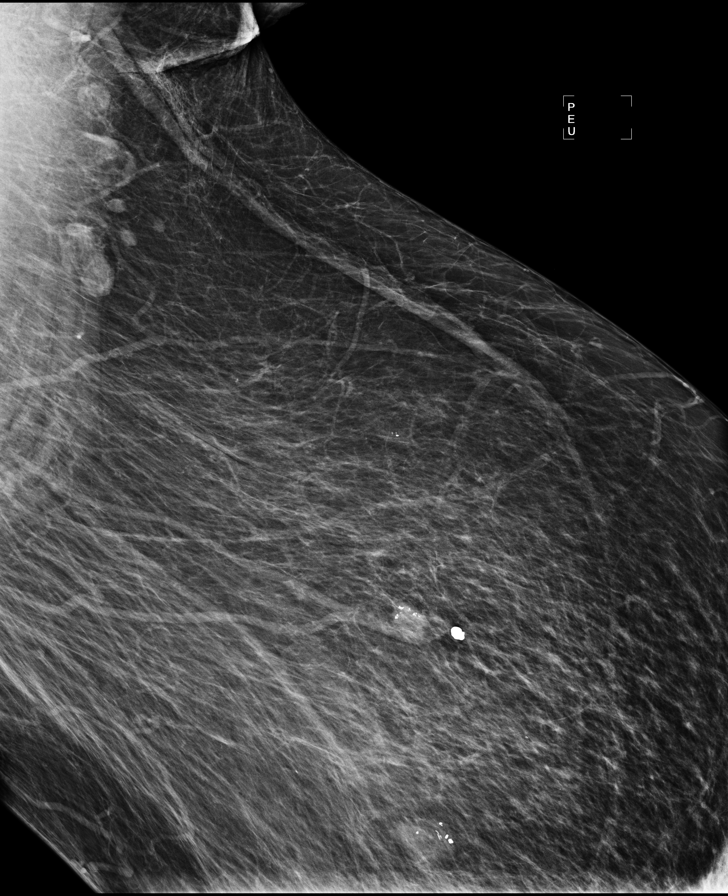

[R MLO (1 of 3)]
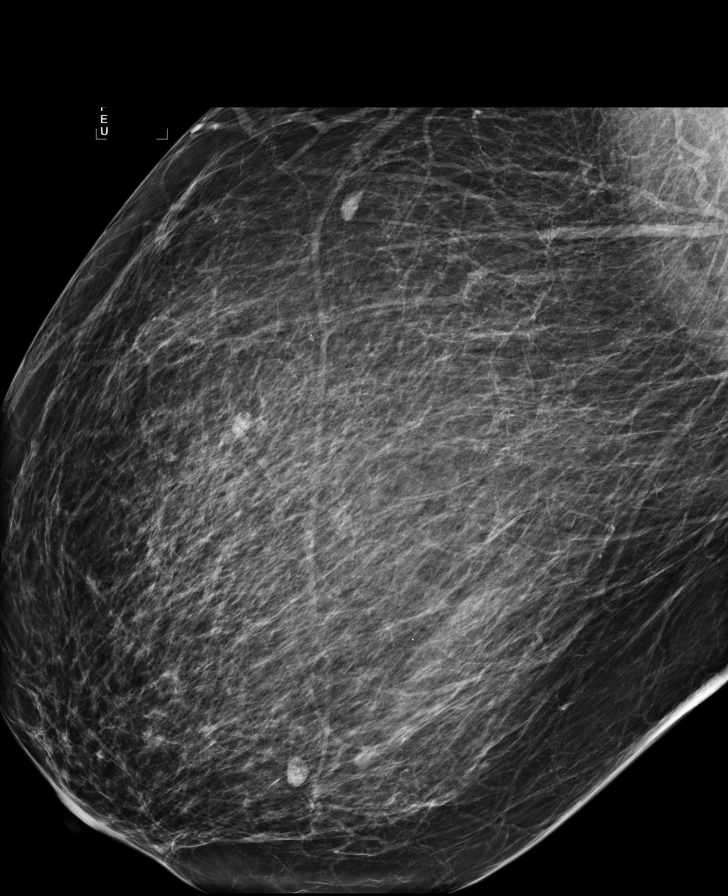

[R MLO (2 of 3)]
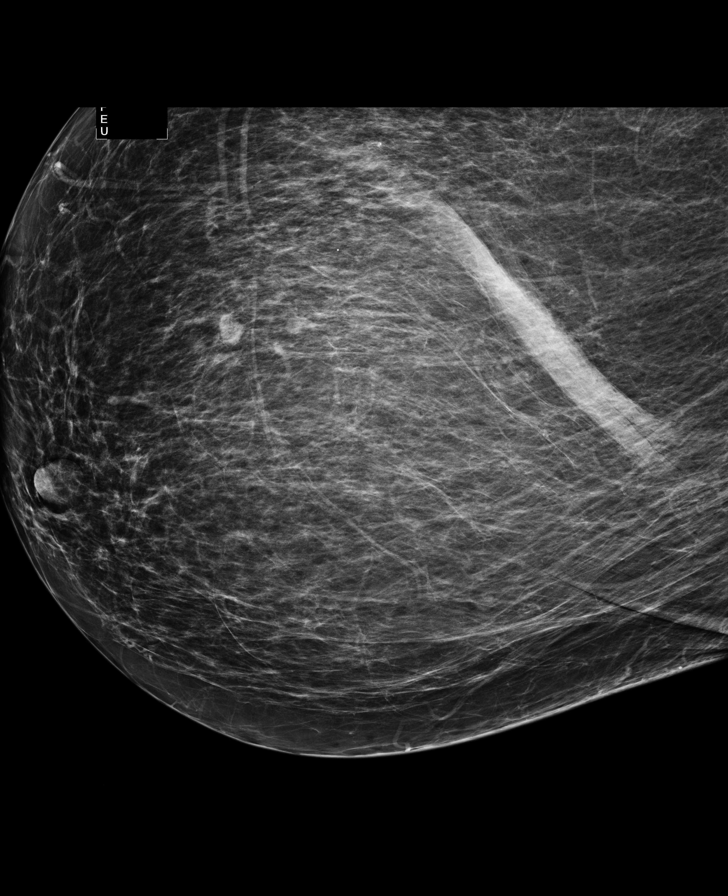

[R CC (2 of 2)]
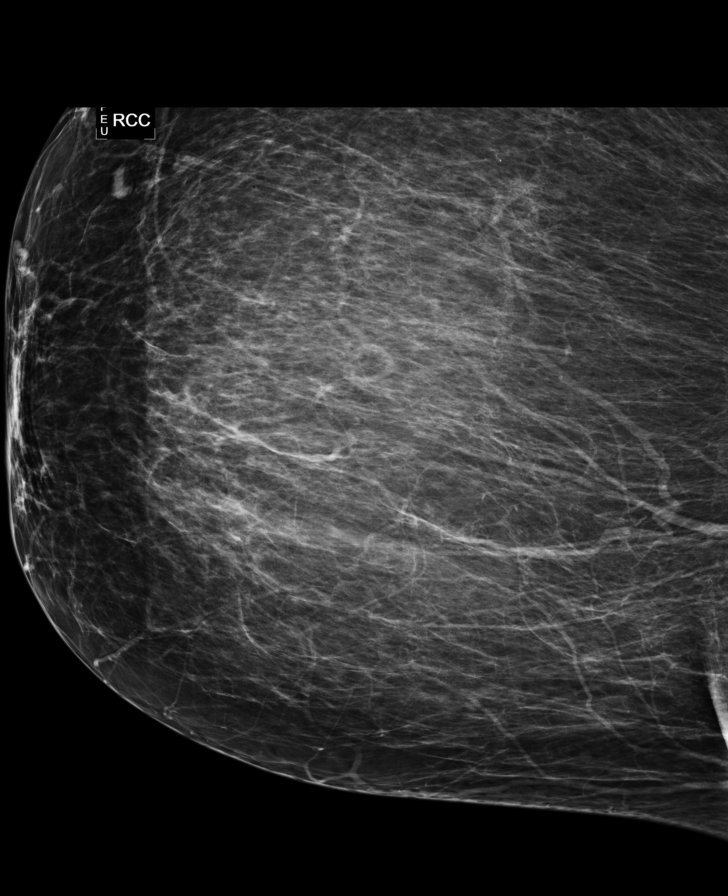

[R MLO (3 of 3)]
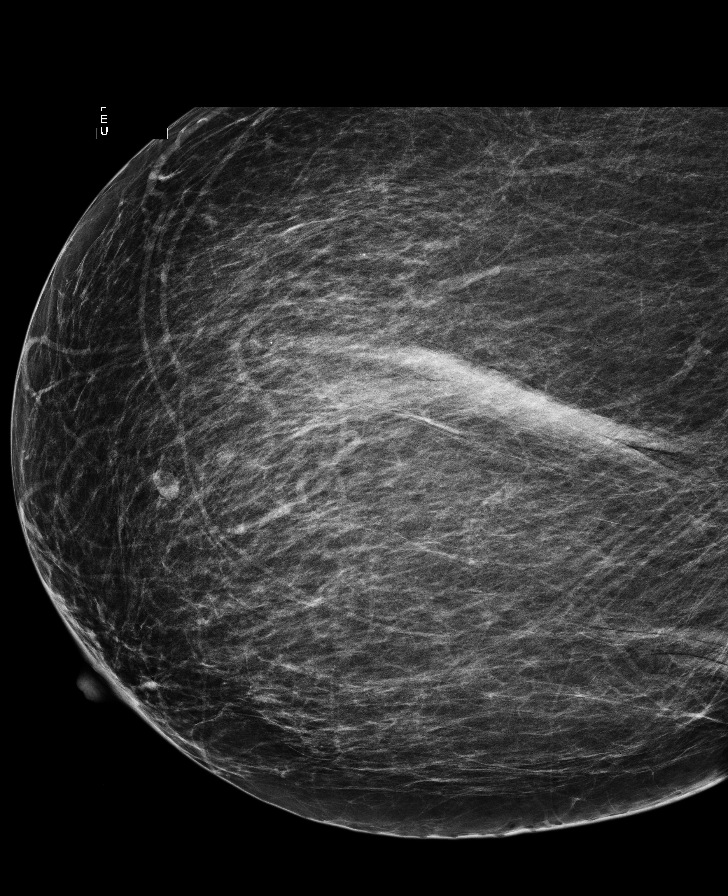

[L MLO (2 of 2)]
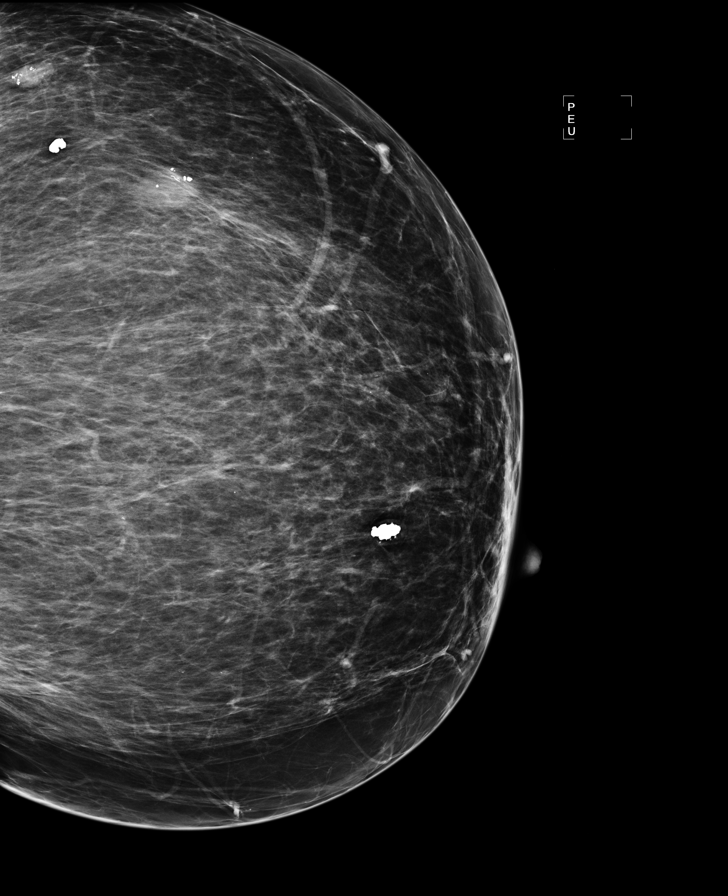

[8 of 11 positions shown; findings below may reference images not displayed]

IMPRESSION: No specific mammographic evidence of malignancy.  Next screening mammogram is recommended in one 
year.

ASSESSMENT: Negative - BI-RADS 1

Screening mammogram in 1 year.
ANALYZED BY COMPUTER AIDED DETECTION. , THIS PROCEDURE WAS A DIGITAL MAMMOGRAM.

## 2007-08-20 IMAGING — CR DG CERVICAL SPINE COMPLETE 4+V
5 series · 5 of 5 positions shown · non-contrast
Comparison: [DATE].

CLINICAL DATA: Chronic neck pain.   Low back pain radiating to right leg.  
 CERVICAL SPINE ? 5 VIEW:

[w c-spine lat]
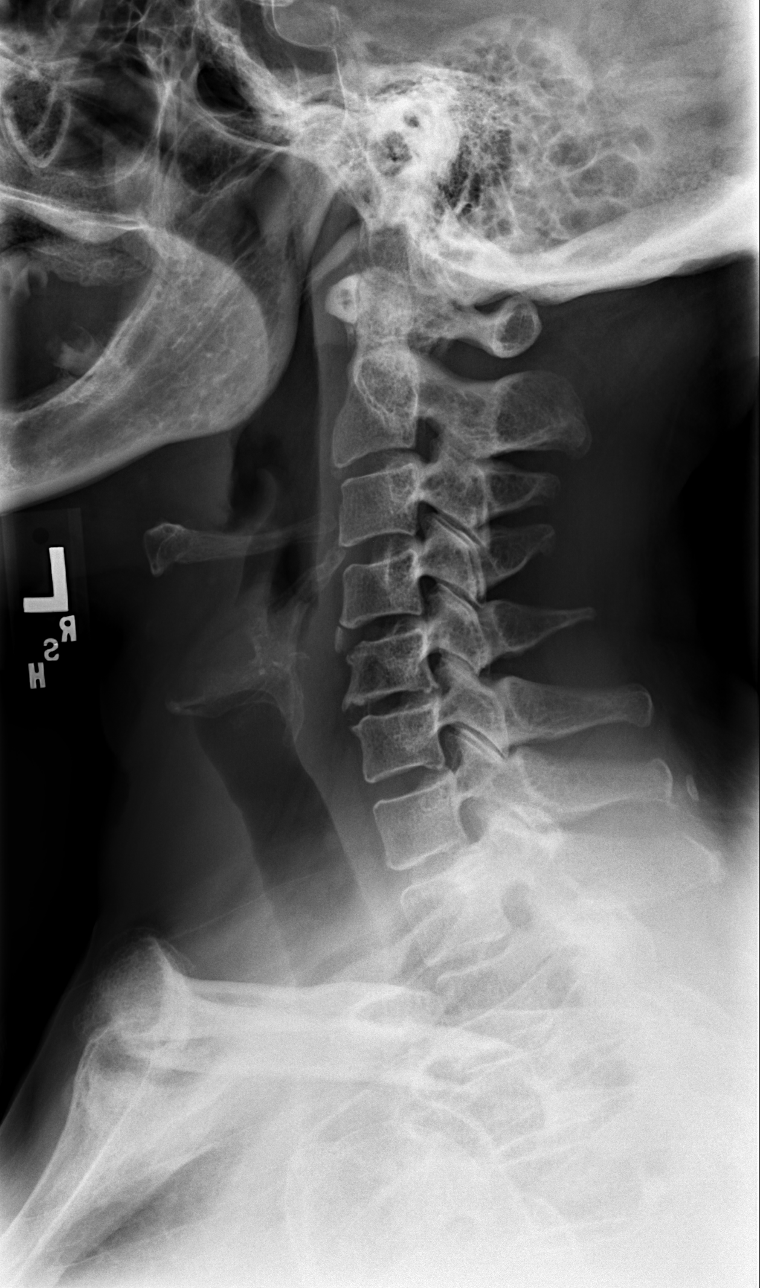

[w c-spine oblique (1 of 2)]
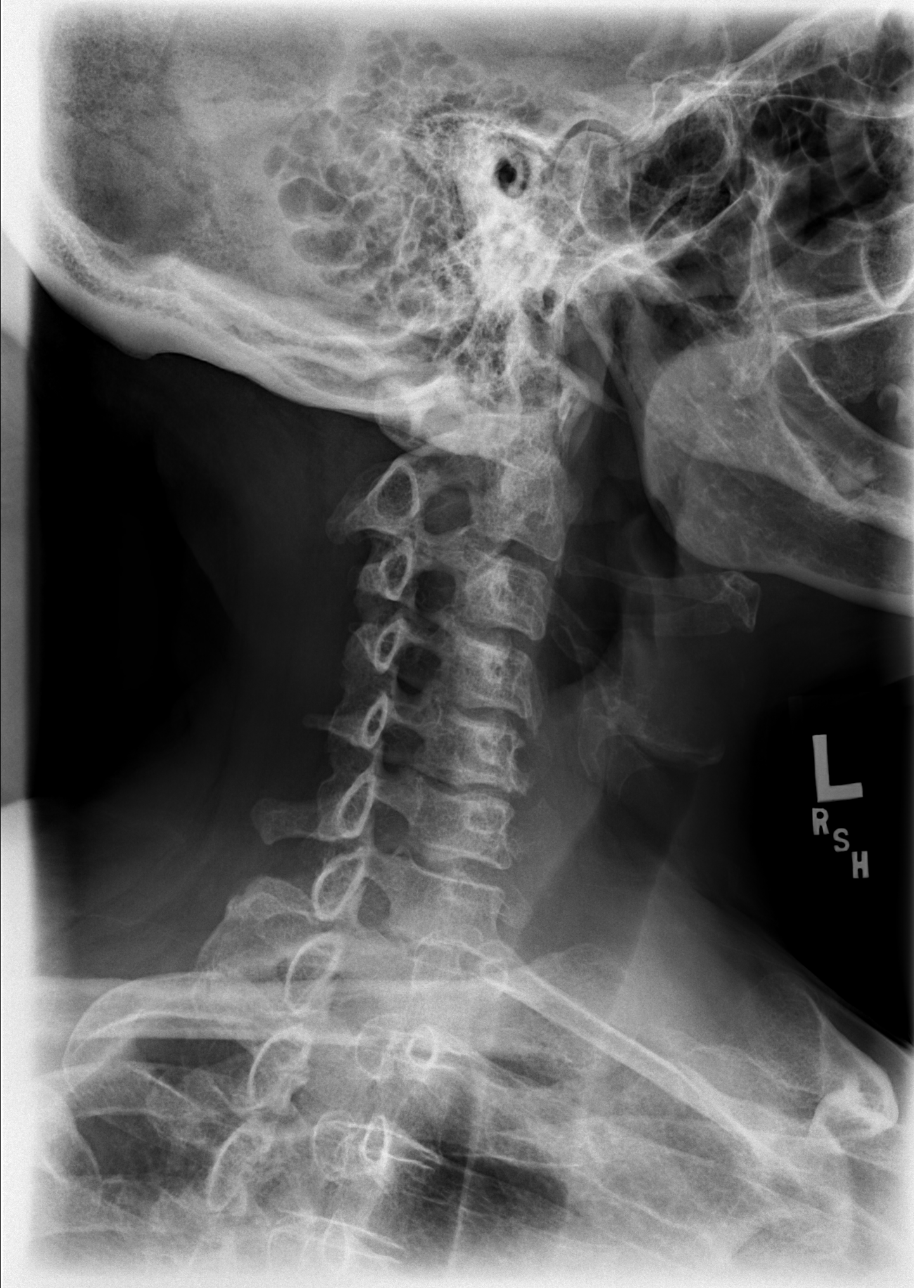

[w c-spine oblique (2 of 2)]
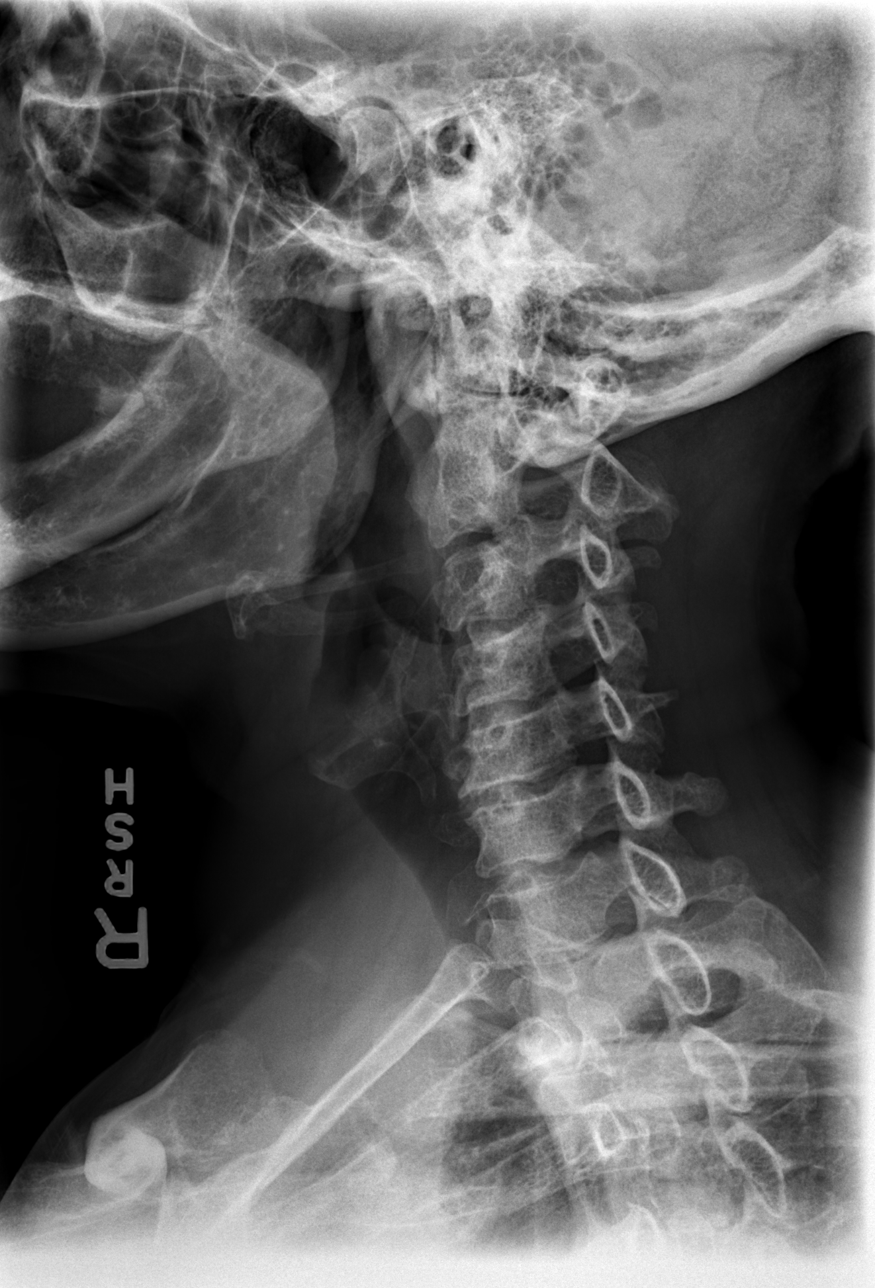

[w c-spine a.p.]
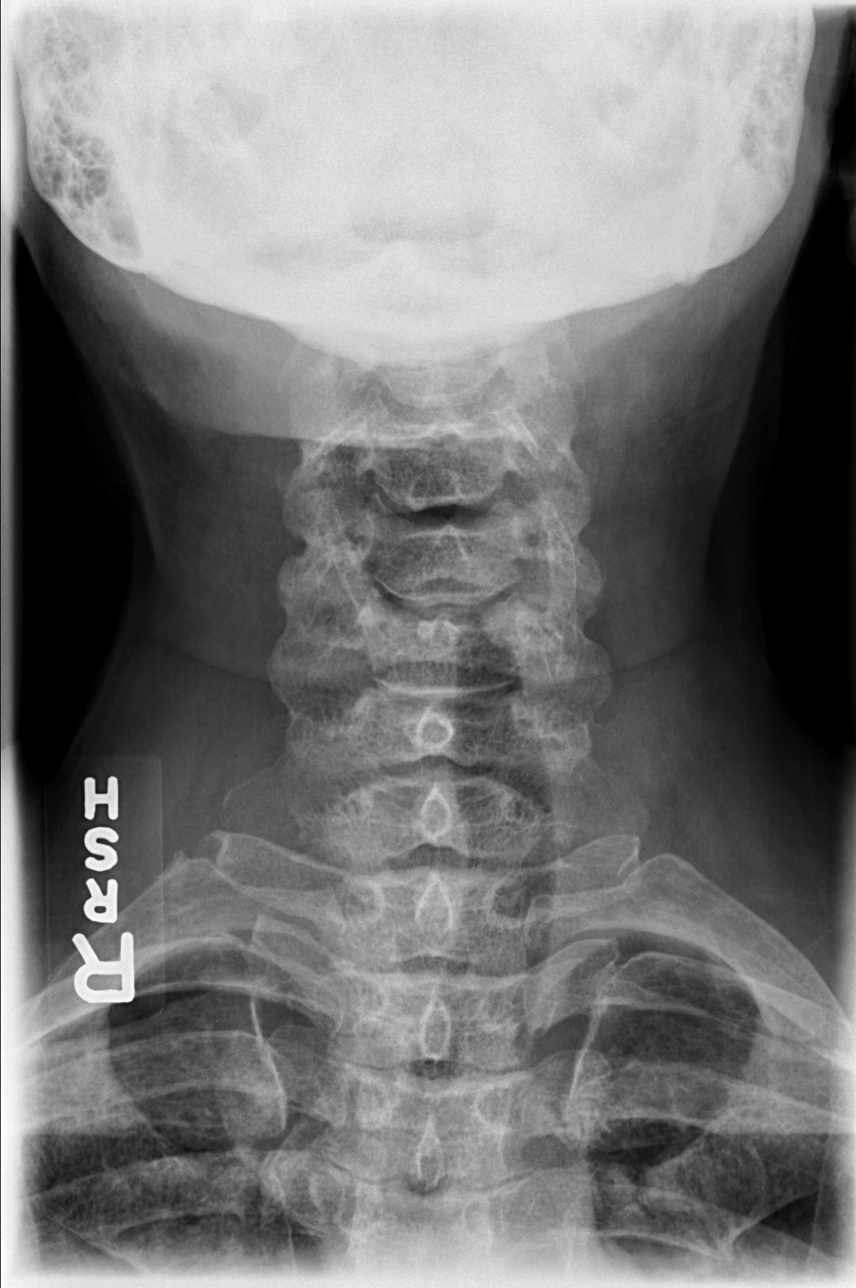

[w c-spine odontoid]
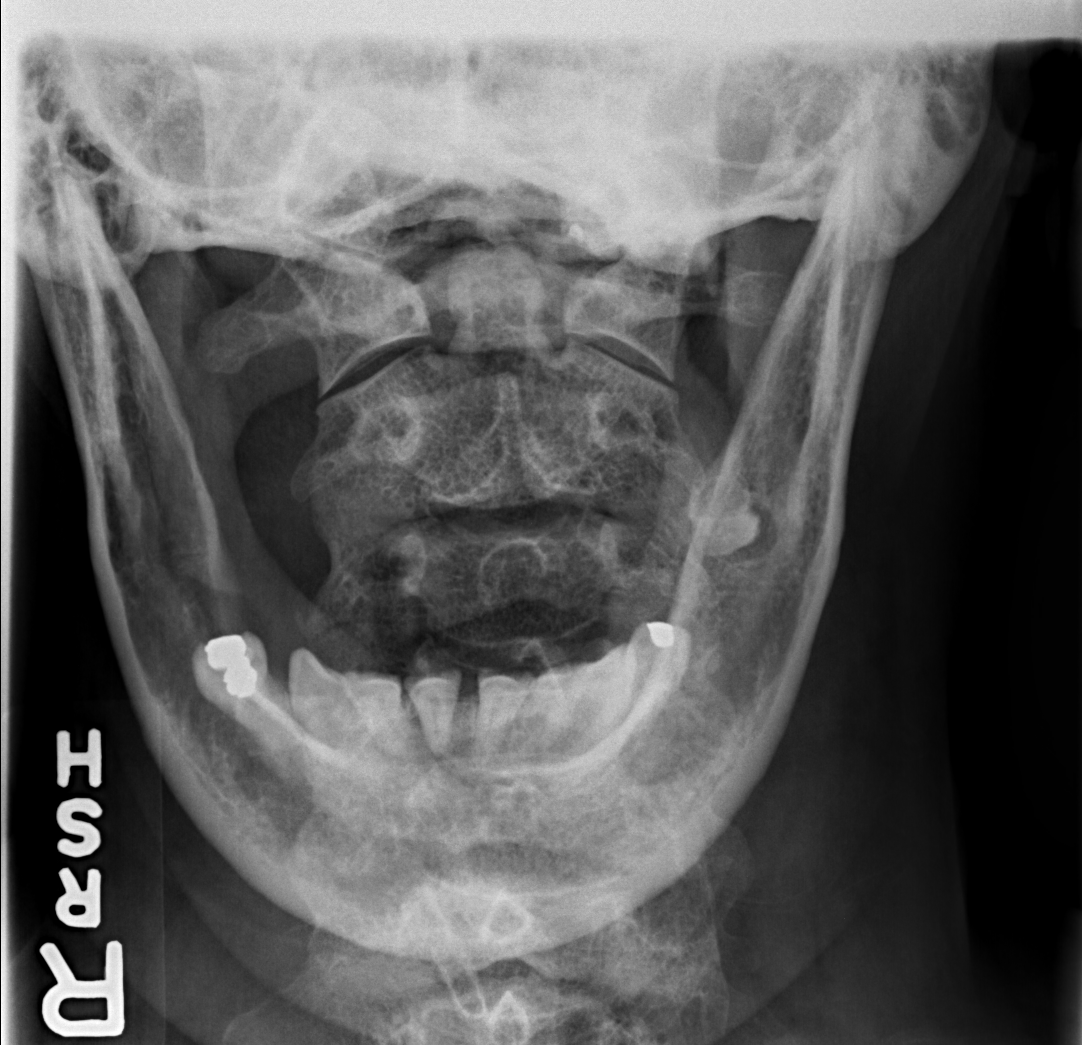

[5 of 5 positions shown; findings below may reference images not displayed]

FINDINGS: Degenerative disc space narrowing is noted at C4-5 and C5-6 levels.   There are no fractures, subluxations or destructive changes. The bony foramina are adequately maintained.  The craniovertebral junction has a normal appearance.
IMPRESSION: C4-5 and C5-6 degenerative disc space narrowing.  No acute findings. 
 LUMBAR SPINE ? 5 VIEW
FINDINGS: There are five lumbar type vertebrae present.  Mild interspace narrowing is noted at the L1-2, L3-4 and L4-5 levels.  This is most severe at the L4-5 level. There are osteophytic changes present.  There are no fractures, subluxations or destructive changes.
IMPRESSION: Degenerative disc disease noted at the L1-2, L3-4 and L4-5 levels.  No acute findings.

## 2007-08-20 IMAGING — CR DG LUMBAR SPINE COMPLETE 4+V
5 series · 5 of 5 positions shown · non-contrast
Comparison: [DATE].

CLINICAL DATA: Chronic neck pain.   Low back pain radiating to right leg.  
 CERVICAL SPINE ? 5 VIEW:

[t l-spine a.p.]
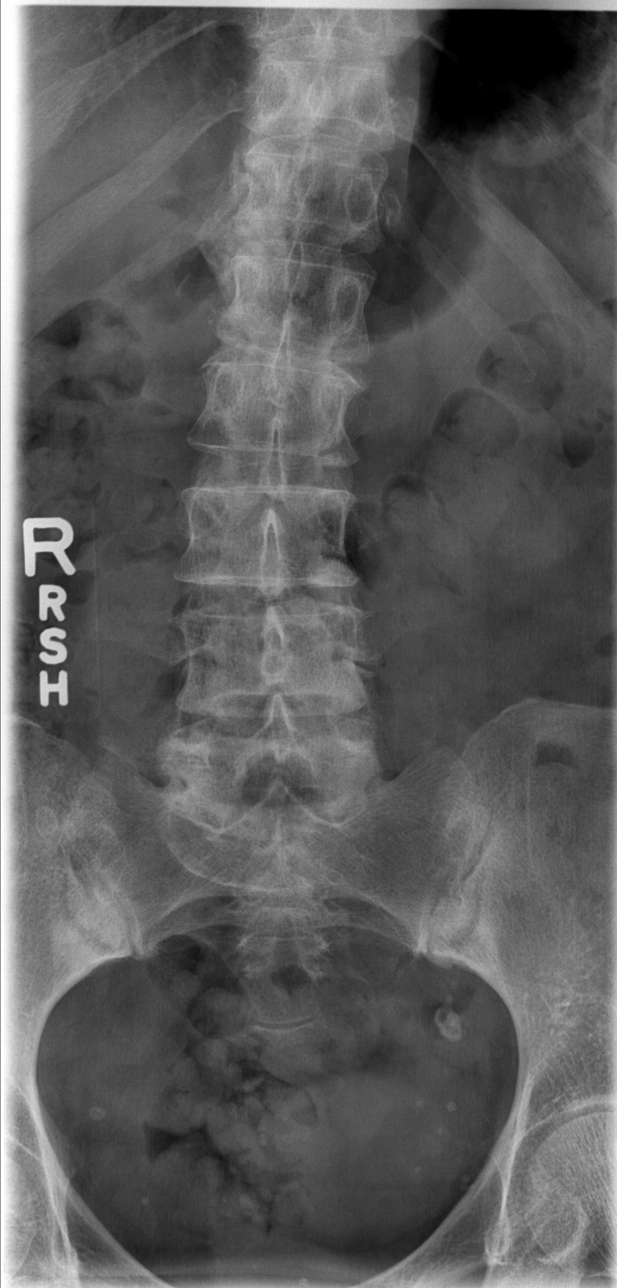

[t l-spine oblique exposure (1 of 2)]
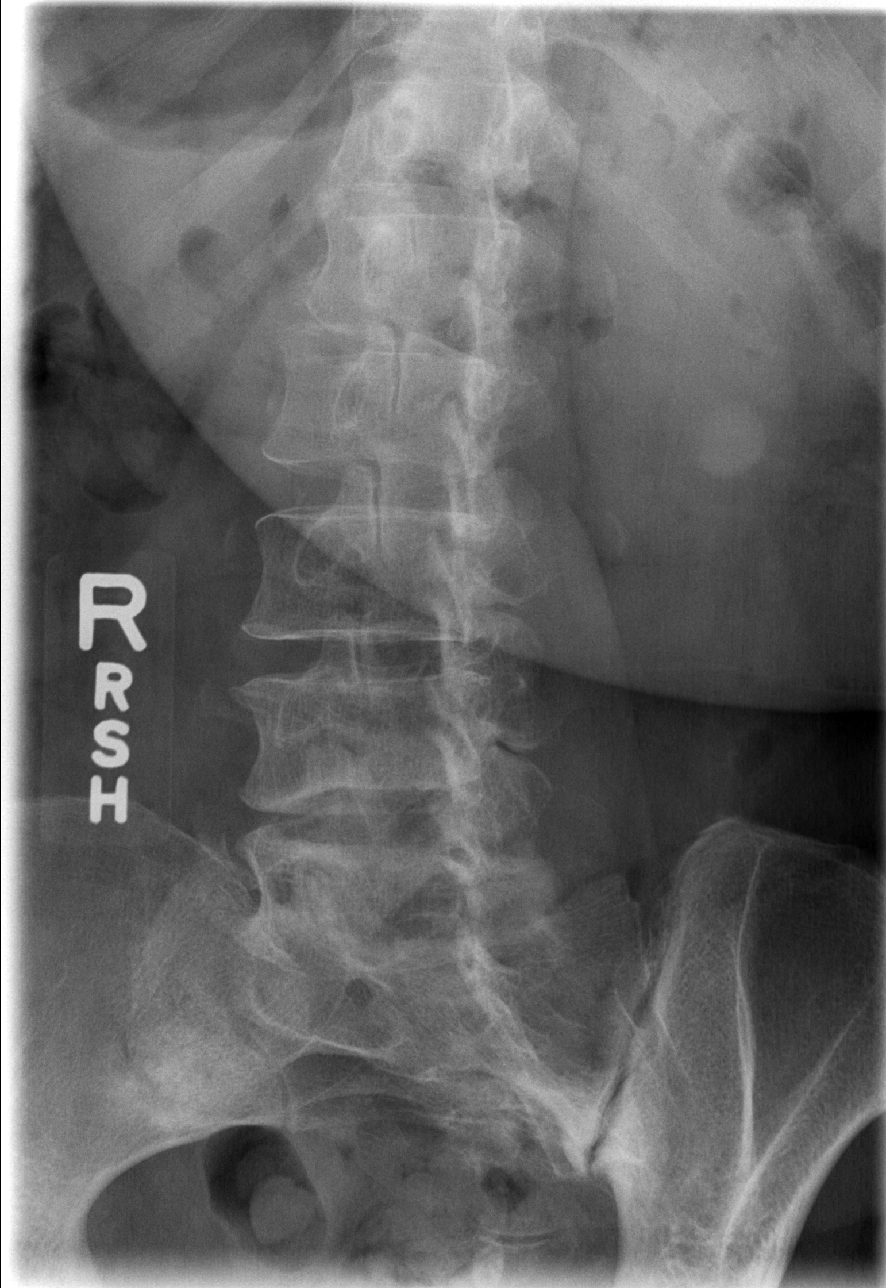

[t l-spine oblique exposure (2 of 2)]
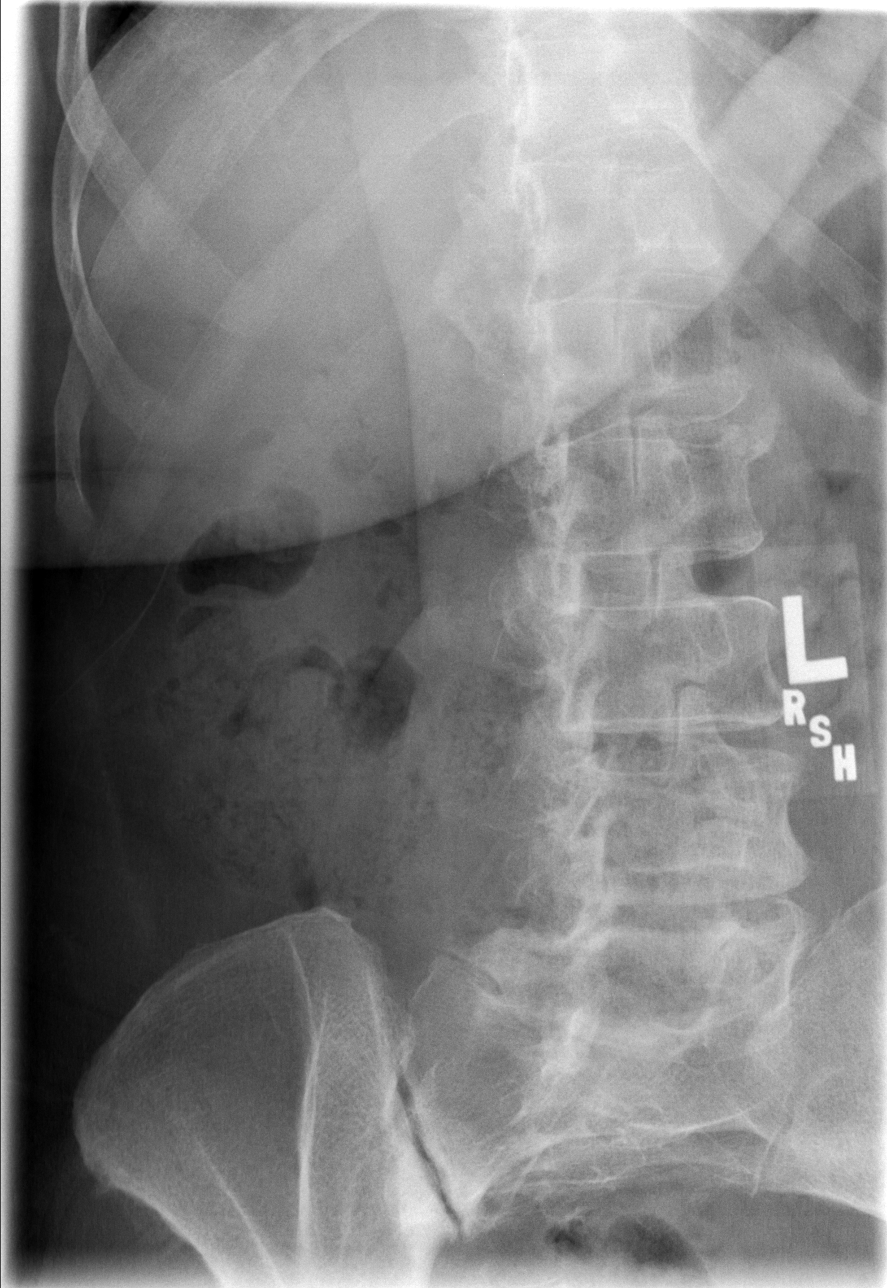

[t l-spine lat]
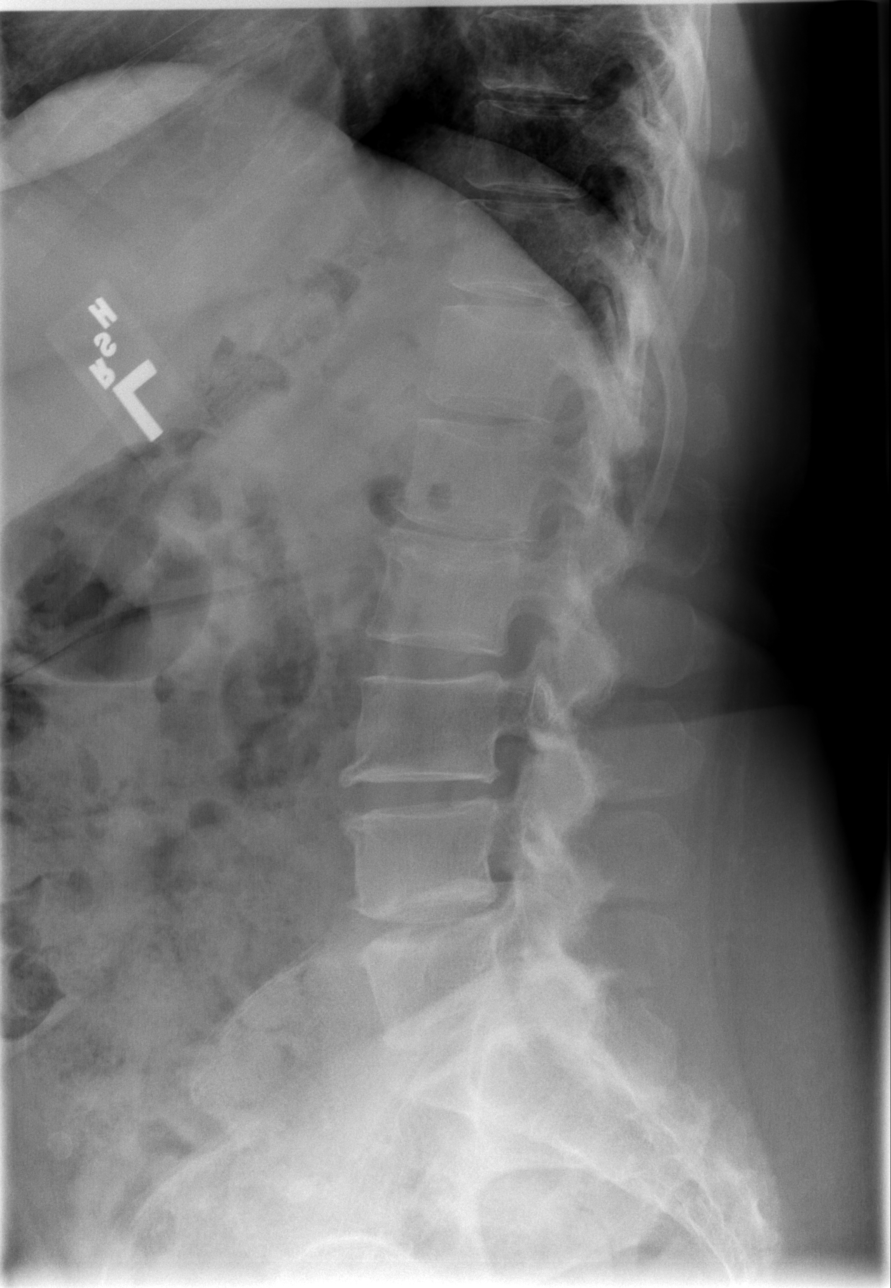

[t l-spine l5-s1 spot]
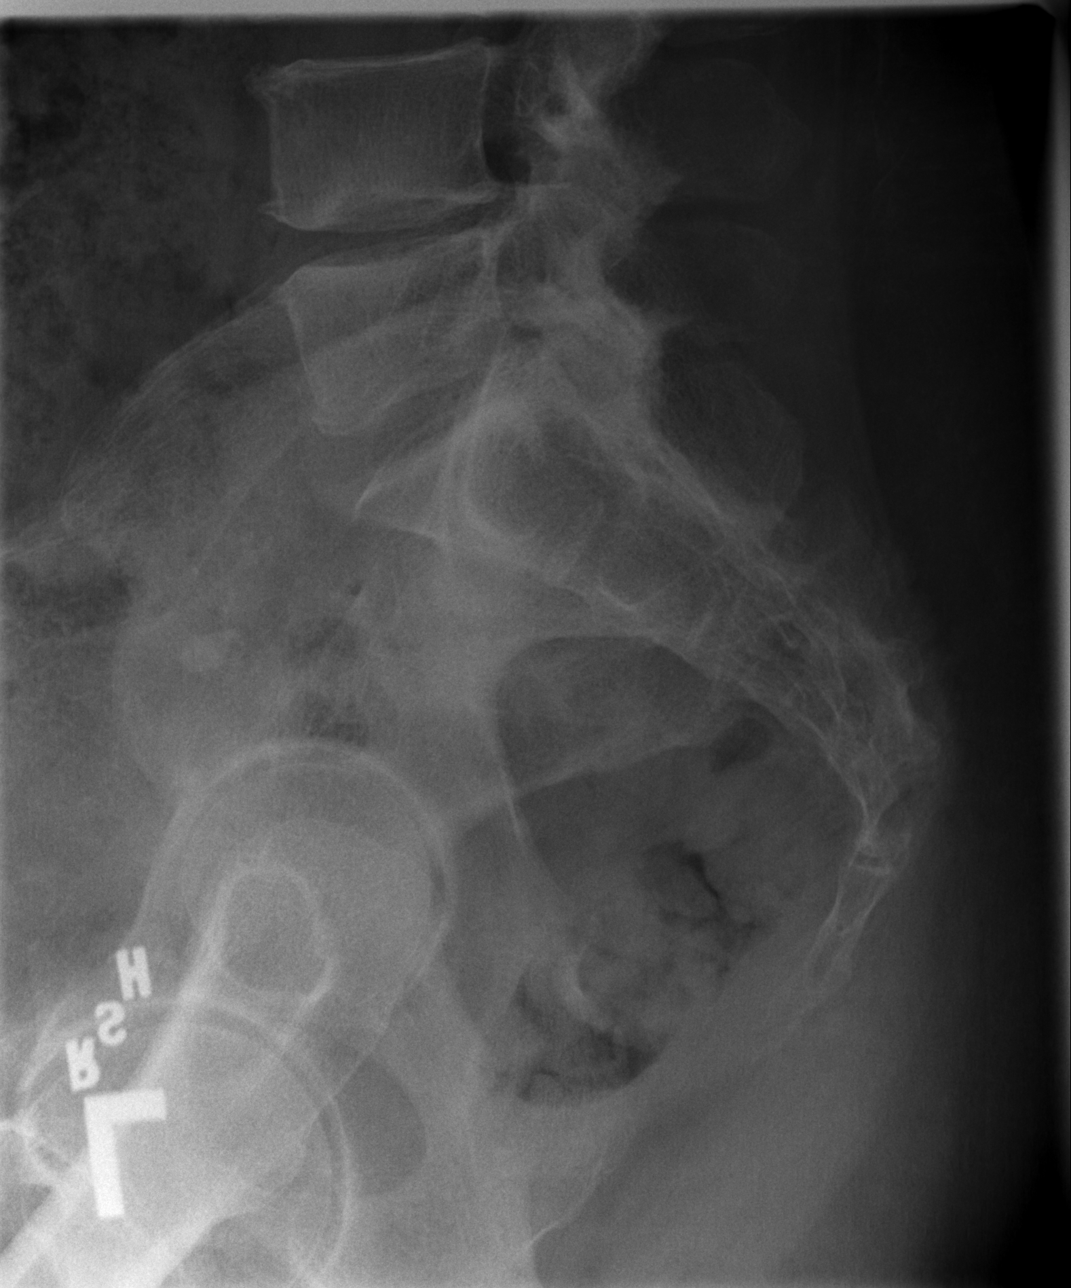

[5 of 5 positions shown; findings below may reference images not displayed]

FINDINGS: Degenerative disc space narrowing is noted at C4-5 and C5-6 levels.   There are no fractures, subluxations or destructive changes. The bony foramina are adequately maintained.  The craniovertebral junction has a normal appearance.
IMPRESSION: C4-5 and C5-6 degenerative disc space narrowing.  No acute findings. 
 LUMBAR SPINE ? 5 VIEW
FINDINGS: There are five lumbar type vertebrae present.  Mild interspace narrowing is noted at the L1-2, L3-4 and L4-5 levels.  This is most severe at the L4-5 level. There are osteophytic changes present.  There are no fractures, subluxations or destructive changes.
IMPRESSION: Degenerative disc disease noted at the L1-2, L3-4 and L4-5 levels.  No acute findings.

## 2007-08-22 ENCOUNTER — Emergency Department (HOSPITAL_COMMUNITY): Admission: EM | Admit: 2007-08-22 | Discharge: 2007-08-22 | Payer: Self-pay | Admitting: Emergency Medicine

## 2007-08-22 IMAGING — CR DG CHEST 2V
2 series · 2 of 2 positions shown · non-contrast
Comparison: [DATE]

CLINICAL DATA: Tingling fingers and toes.  Chest pain. 
 CHEST ? 2 VIEW:

[w chest pa]
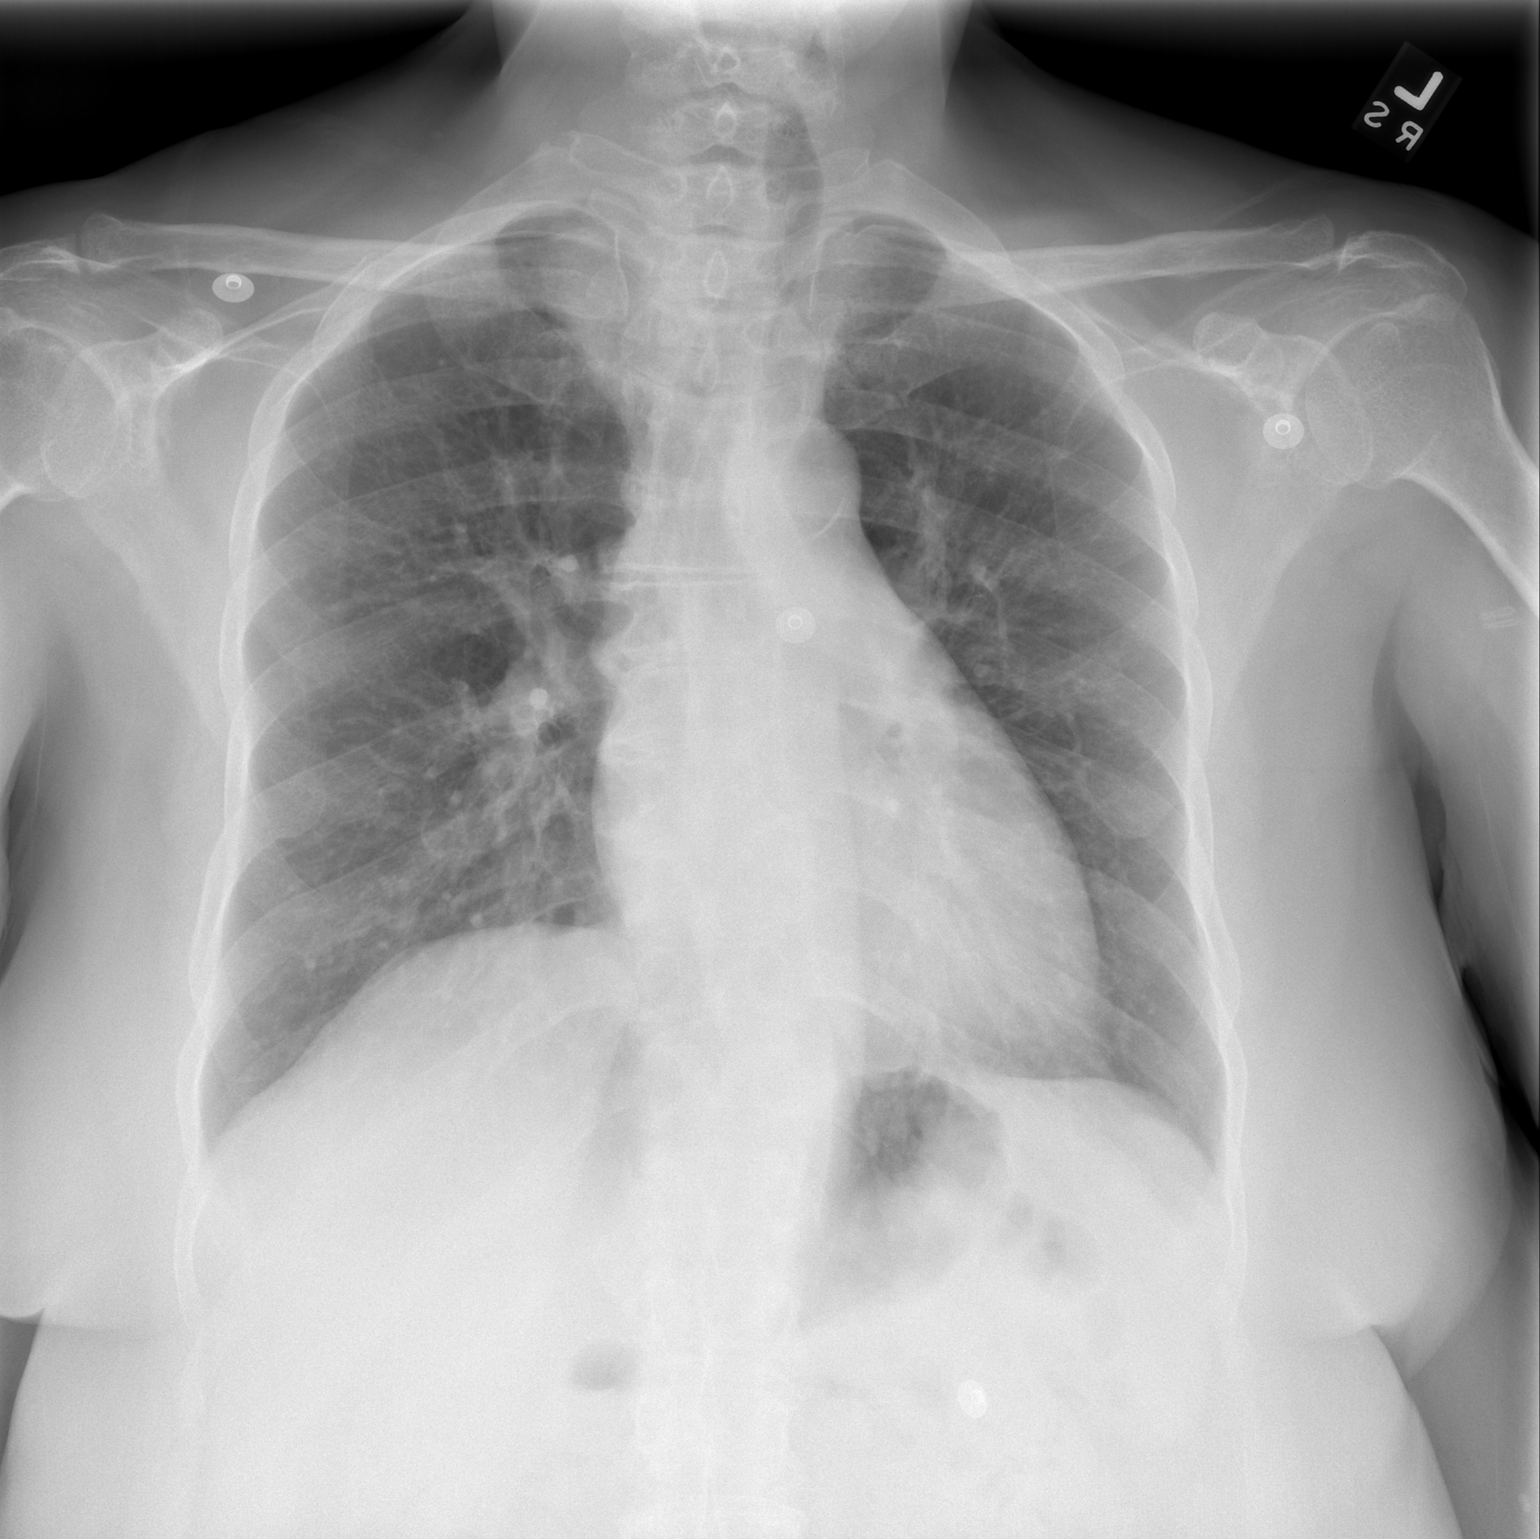

[w chest lat]
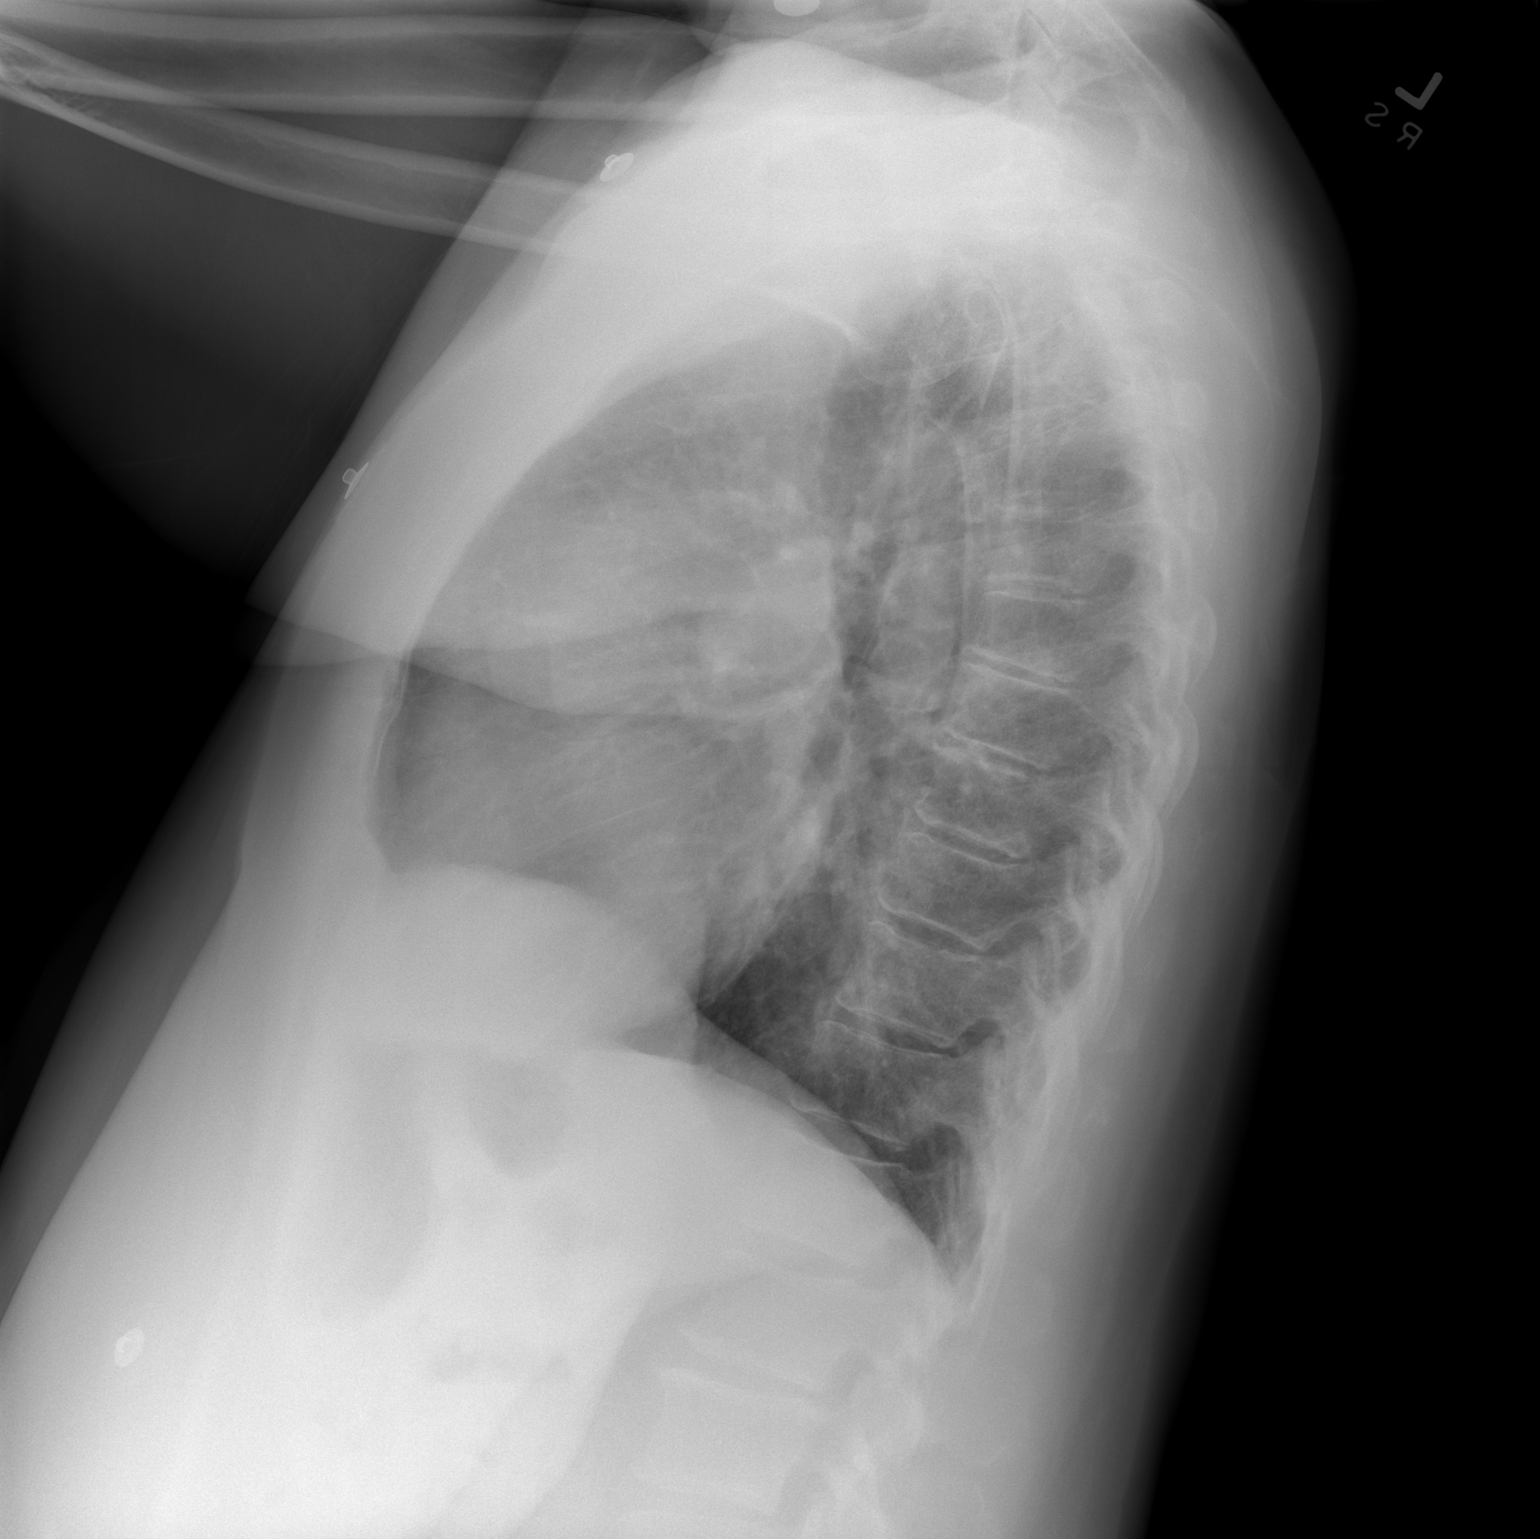

[2 of 2 positions shown; findings below may reference images not displayed]

FINDINGS: There is mild cardiac enlargement.  
 No pleural fluid or pulmonary edema.  
 No airspace opacities identified. 
 Interstitial change consistent with COPD.
IMPRESSION: Mild      cardiac enlargement without heart failure.
  COPD.

## 2007-10-10 ENCOUNTER — Ambulatory Visit: Payer: Self-pay | Admitting: Family Medicine

## 2007-10-10 LAB — CONVERTED CEMR LAB
ALT: 10 units/L (ref 0–35)
AST: 19 units/L (ref 0–37)
Albumin: 3.9 g/dL (ref 3.5–5.2)
Calcium: 9.8 mg/dL (ref 8.4–10.5)
Chloride: 102 meq/L (ref 96–112)
Potassium: 4 meq/L (ref 3.5–5.3)

## 2007-10-15 ENCOUNTER — Ambulatory Visit (HOSPITAL_COMMUNITY): Admission: RE | Admit: 2007-10-15 | Discharge: 2007-10-15 | Payer: Self-pay | Admitting: Family Medicine

## 2007-10-15 IMAGING — US US SOFT TISSUE HEAD/NECK
1 series · 14 of 25 positions shown · non-contrast
Comparison: none

CLINICAL DATA: Enlarged thyroid gland.
 THYROID ULTRASOUND ? [DATE]:
TECHNIQUE: Ultrasound examination of the thyroid gland and adjacent soft tissue structures was performed.

[Series 1: unknown · 0.13mm/px · 14 of 33 slices shown]
[im 1/33]
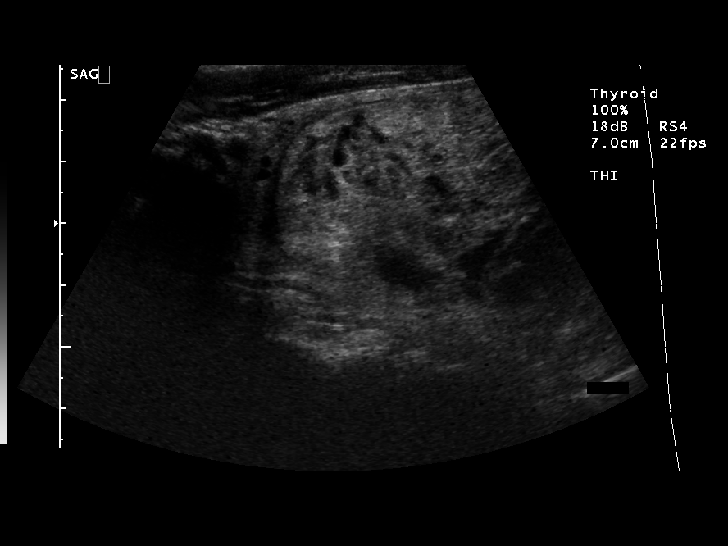
[im 3/33]
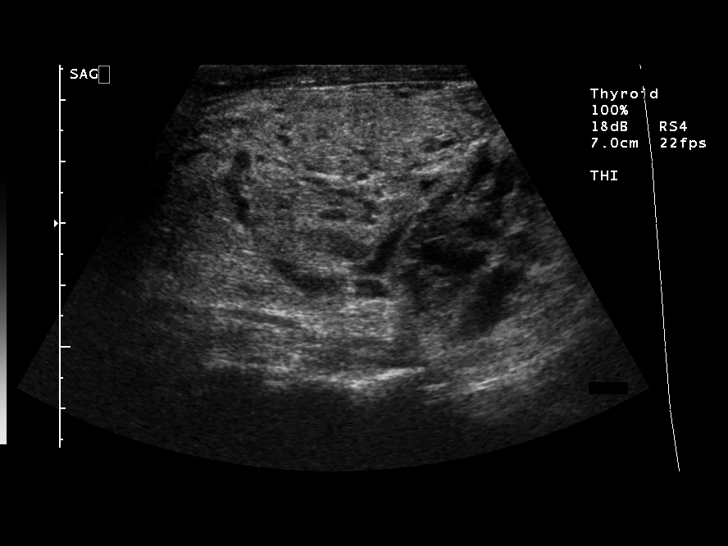
[im 6/33]
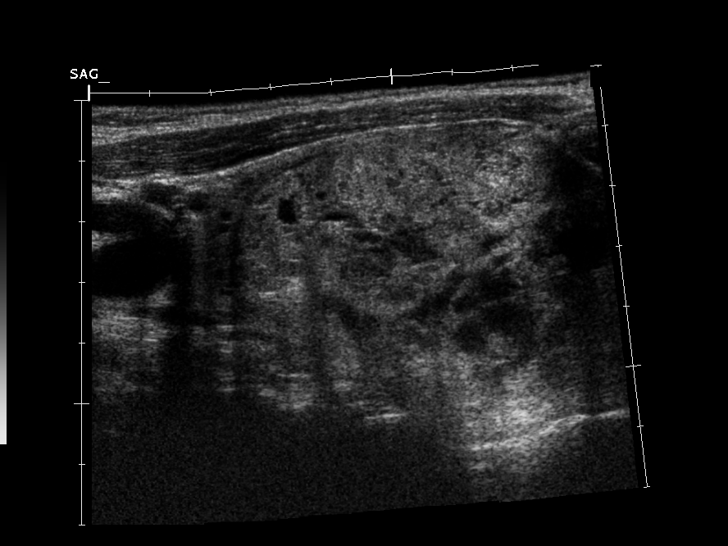
[im 9/33]
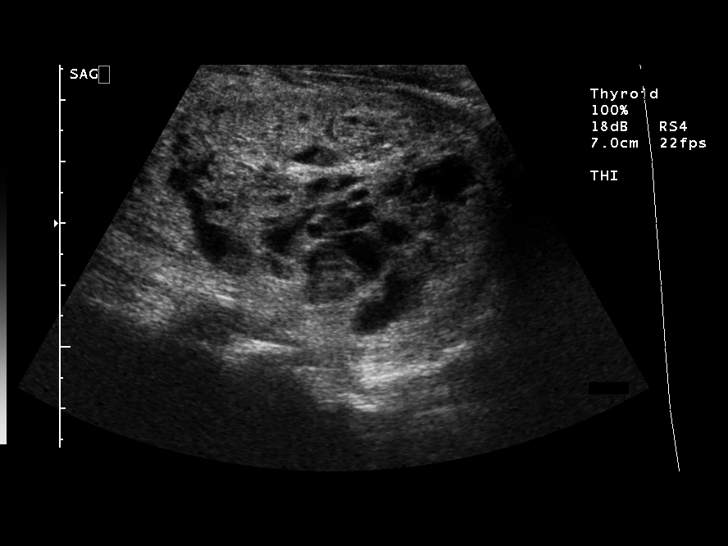
[im 11/33]
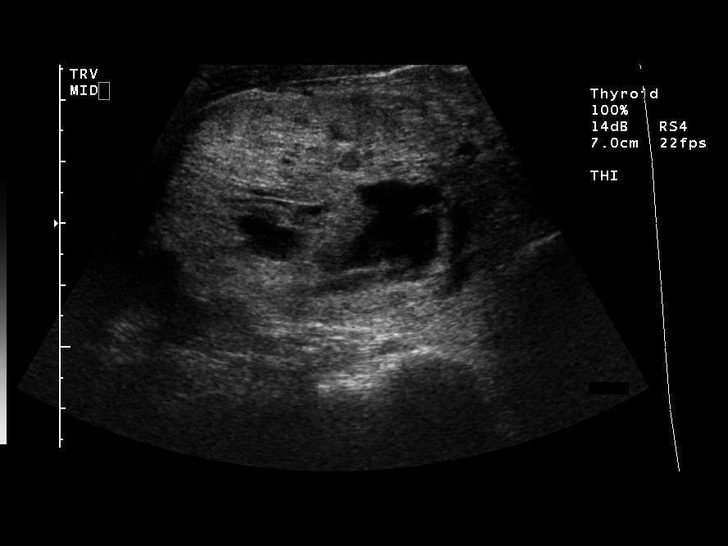
[im 13/33]
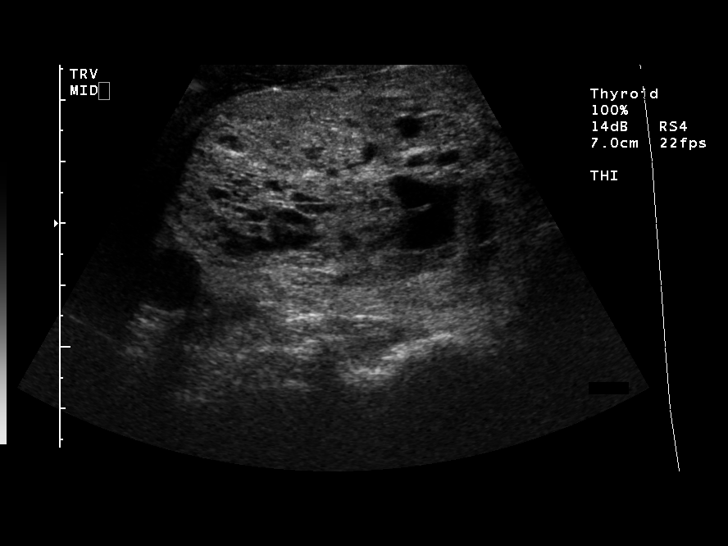
[im 15/33]
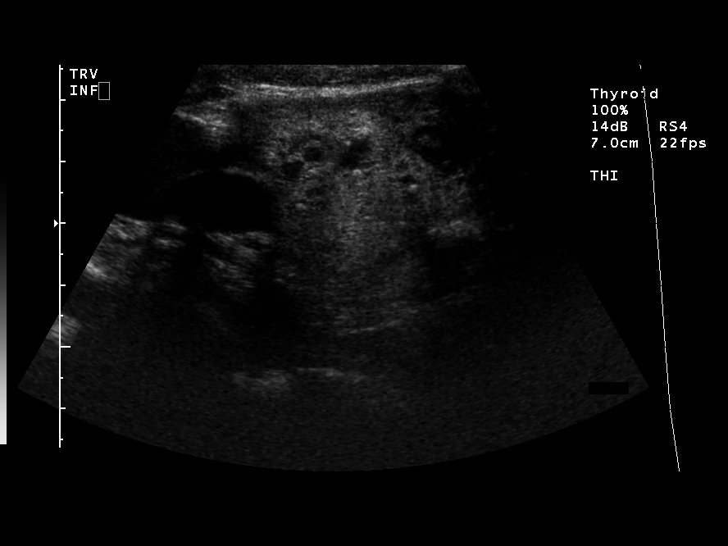
[im 18/33]
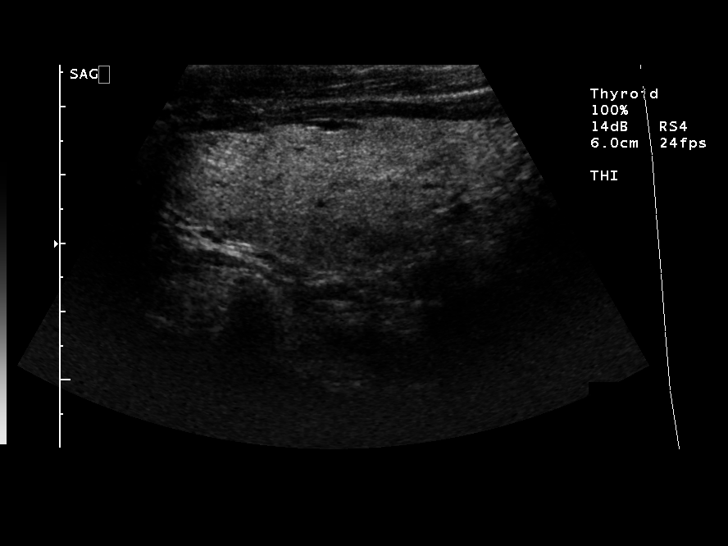
[im 21/33]
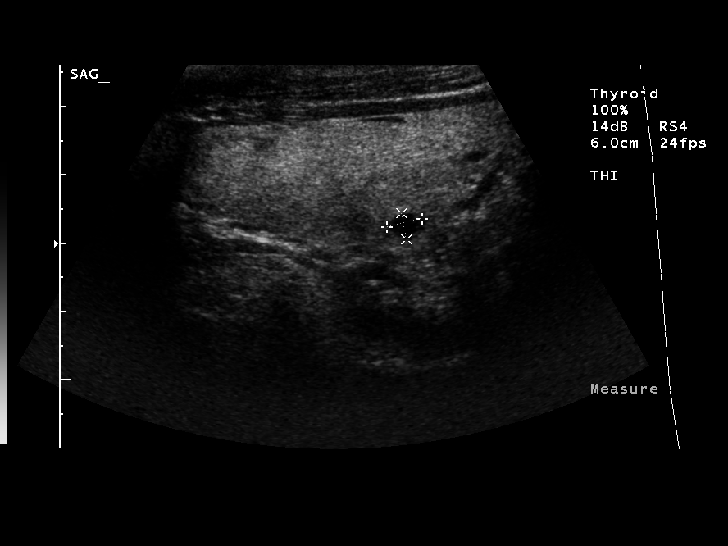
[im 22/33]
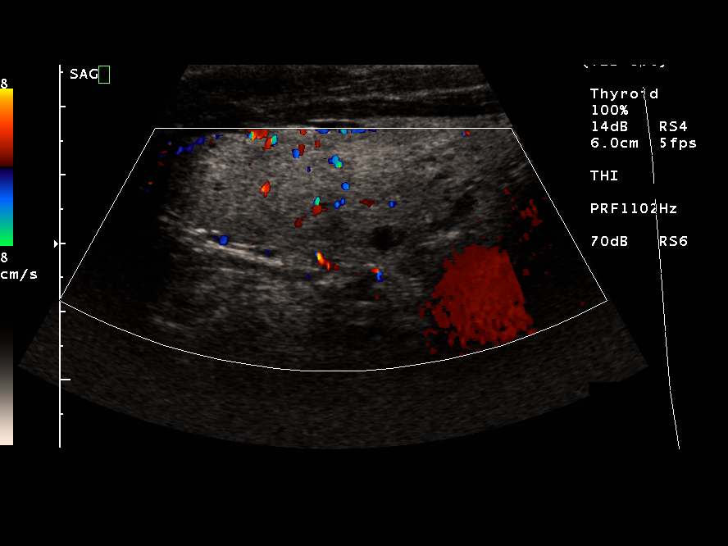
[im 25/33]
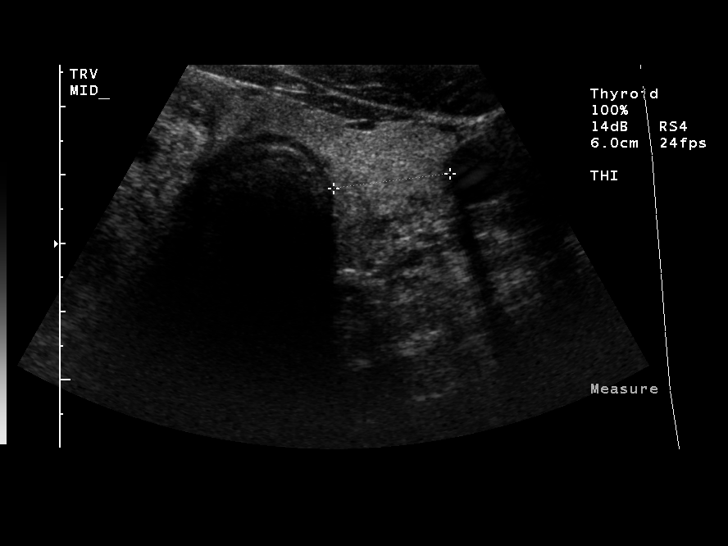
[im 27/33]
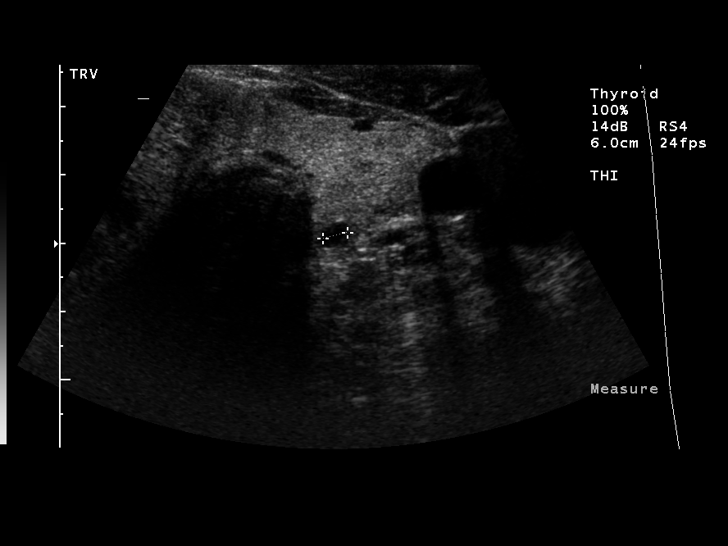
[im 30/33]
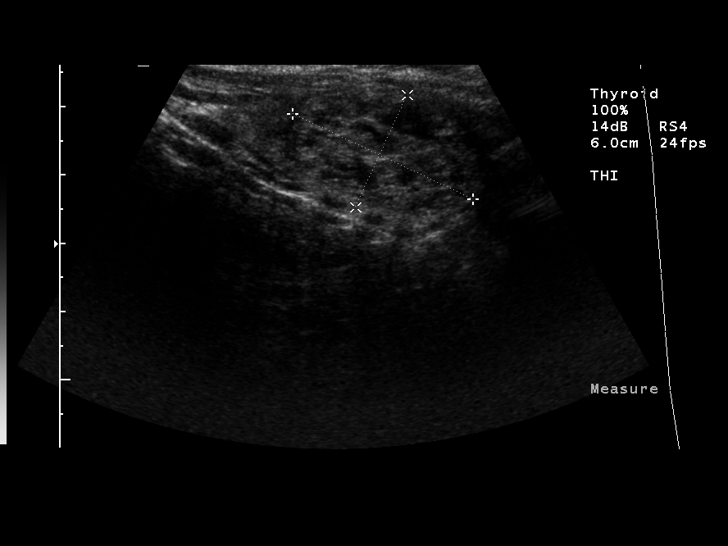
[im 33/33]
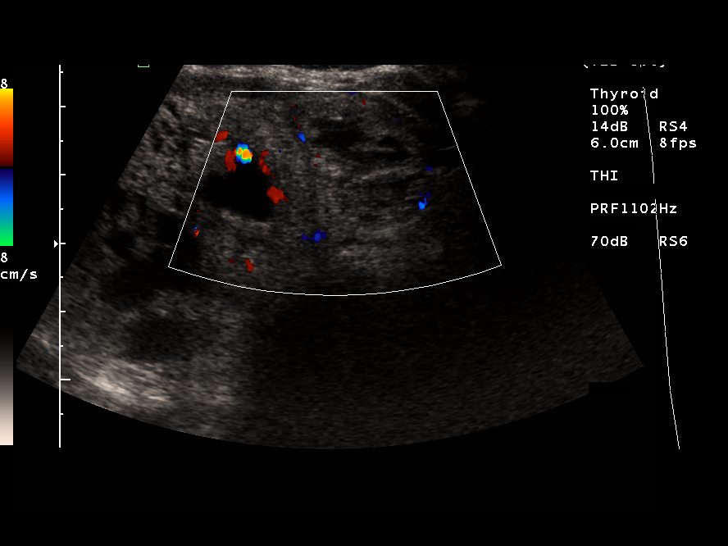

[14 of 25 positions shown; findings below may reference images not displayed]

FINDINGS: The scan demonstrates a large, inhomogeneous, primarily solid nodule in the right lobe of the thyroid gland measuring 7.4 x 5.2 x 5.9 cm.  Overall size of the right lobe is 8.0 x 5.3 x 6.2 cm.
 There is also a 2.9 x 1.8 x 1.6 cm inhomogeneous, primarily solid mass in the right side of the isthmus.  
 The left lobe measures 5.7 x 2.3 x 1.7 cm and has a 0.5 x 0.4 x 0.4 cm nodule in the lower pole.
IMPRESSION: Large, inhomogeneous, primarily solid mass occupying the vast majority of the right lobe of the thyroid gland with another prominent nodule in the isthmus.    I recommend consideration of percutaneous needle aspiration biopsy.

## 2007-11-06 ENCOUNTER — Ambulatory Visit: Payer: Self-pay | Admitting: Family Medicine

## 2007-12-18 ENCOUNTER — Ambulatory Visit (HOSPITAL_COMMUNITY): Admission: RE | Admit: 2007-12-18 | Discharge: 2007-12-18 | Payer: Self-pay | Admitting: Family Medicine

## 2007-12-18 ENCOUNTER — Encounter (INDEPENDENT_AMBULATORY_CARE_PROVIDER_SITE_OTHER): Payer: Self-pay | Admitting: Interventional Radiology

## 2007-12-18 IMAGING — US US BIOPSY
1 series · 10 of 10 positions shown · non-contrast
Comparison: none

CLINICAL DATA: Palpable thyroid nodule.  Comparison is made with
ultrasound performed [DATE] which revealed a dominant right
thyroid  mass.  Request has been made for fine needle aspirate of
this nodule.

ULTRASOUND-GUIDED NEEDLE ASPIRATE BIOPSY, RIGHT LOBE OF THYROID
The above procedure was discussed with the patient and written
informed consent was obtained.

[Series 1: unknown · 0.12mm/px · 10 of 10 slices shown]
[im 1/10]
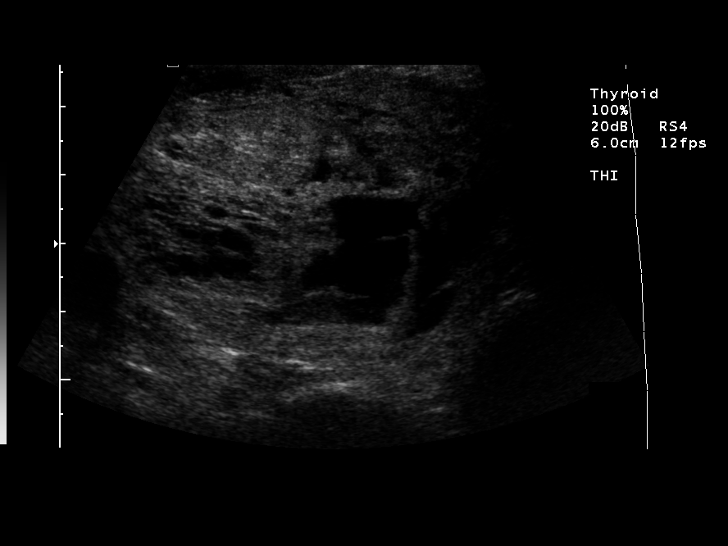
[im 2/10]
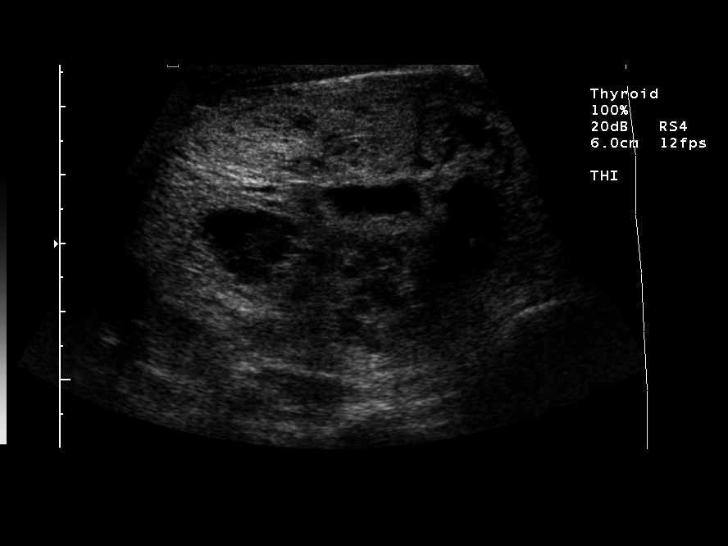
[im 3/10]
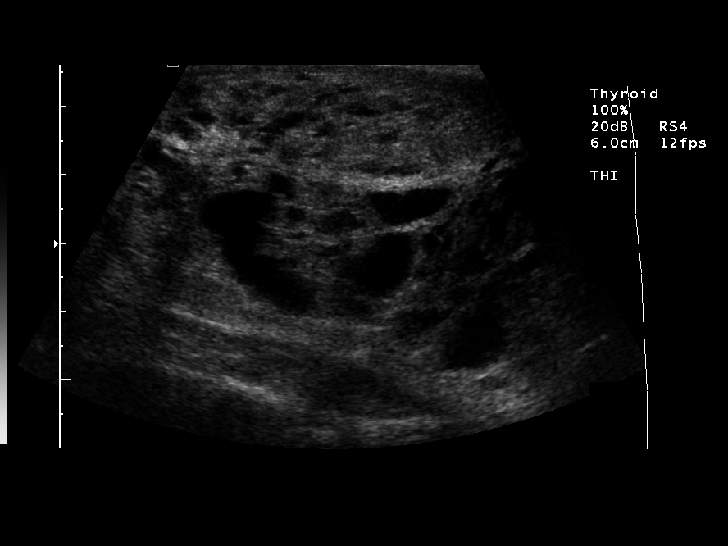
[im 4/10]
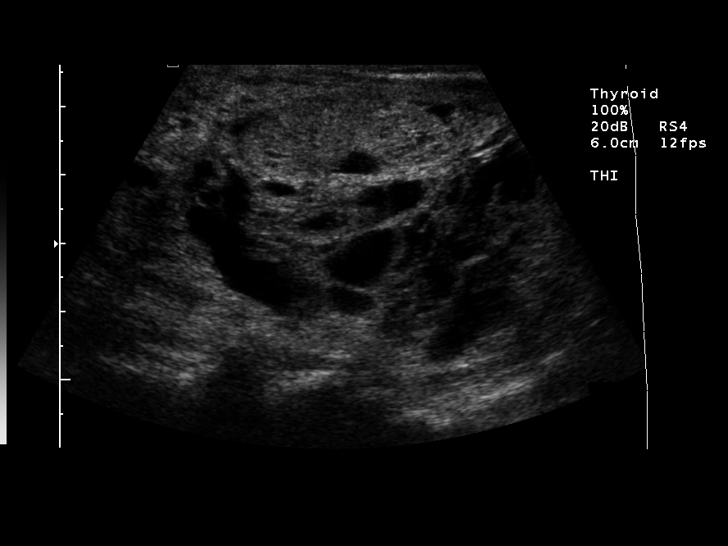
[im 5/10]
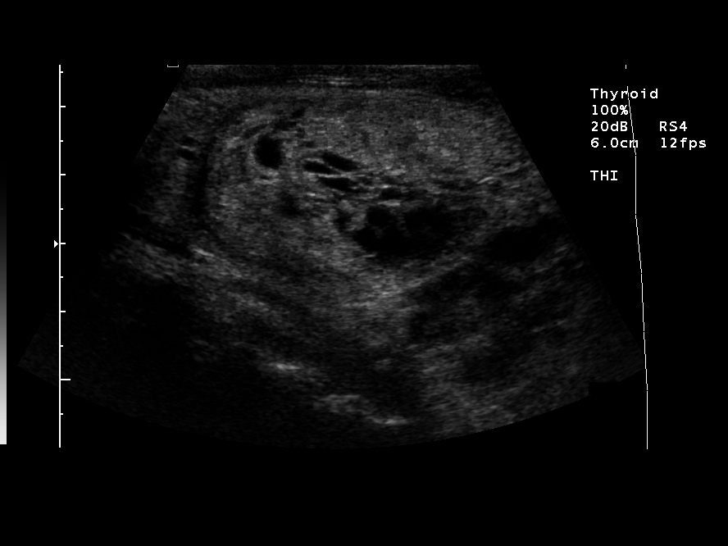
[im 6/10]
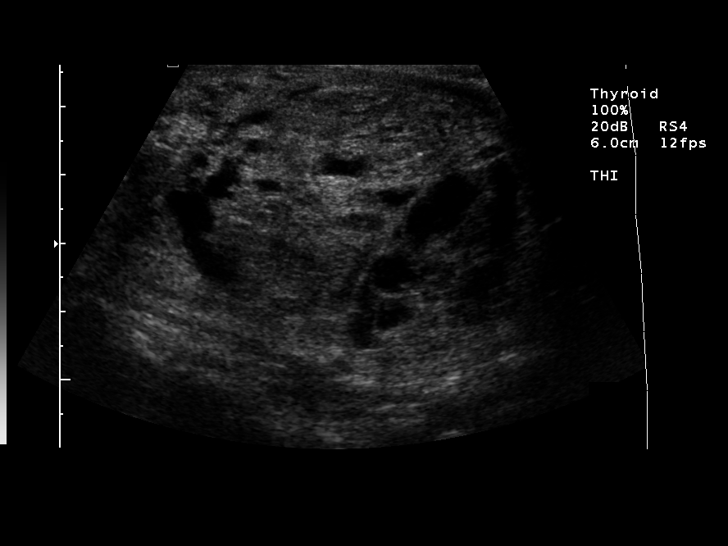
[im 7/10]
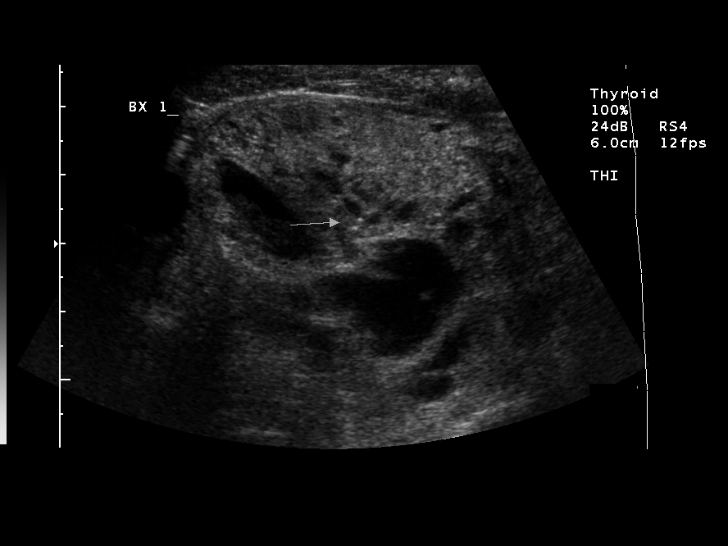
[im 8/10]
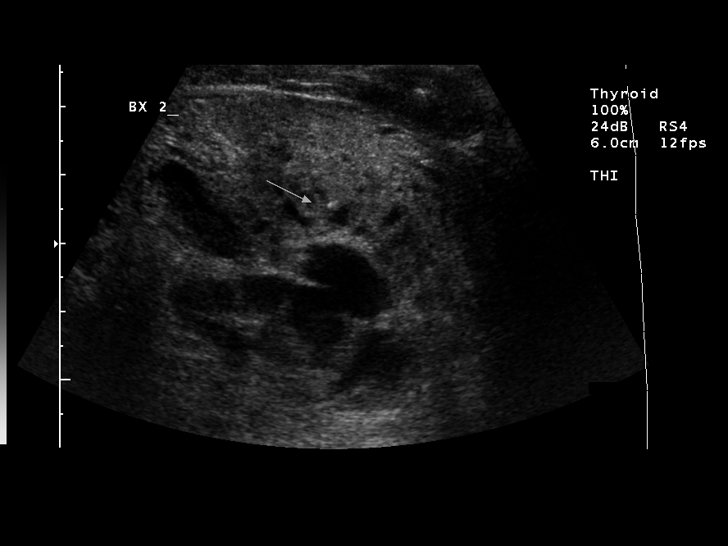
[im 9/10]
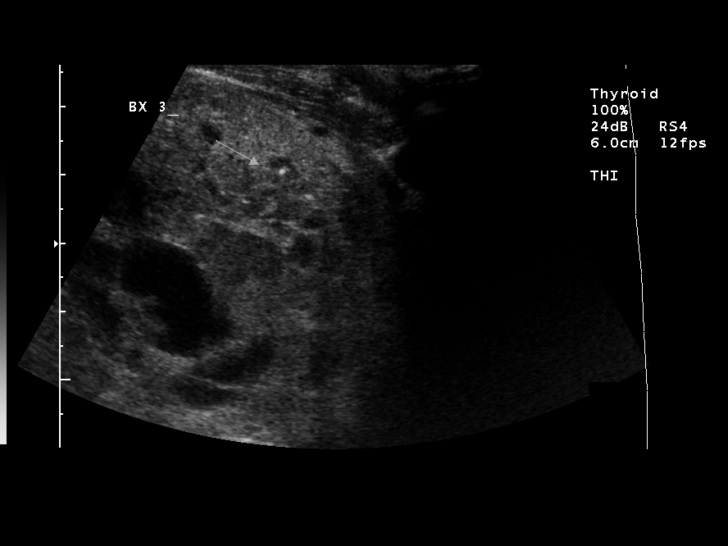
[im 10/10]
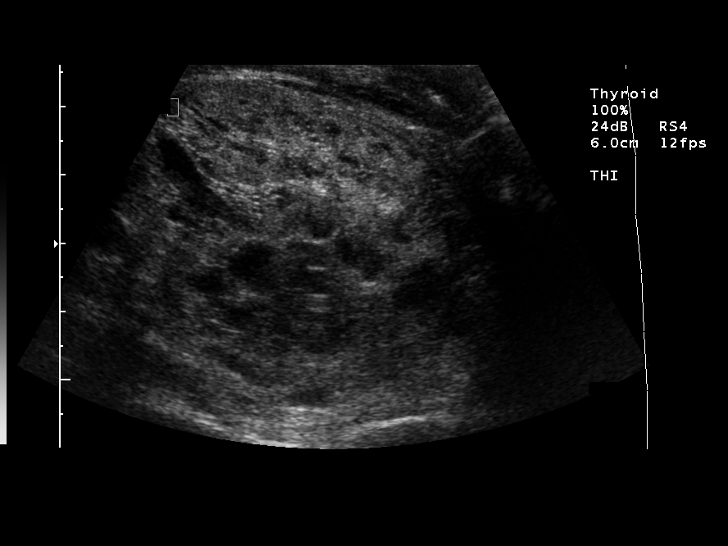

[10 of 10 positions shown; findings below may reference images not displayed]

FINDINGS: Ultrasound was performed to localize and mark an adequate
site for the biopsy.  The patient was then prepped and draped in a
normal sterile fashion.  Local anesthesia was provided with 1%
lidocaine.  Using direct ultrasound guidance, 4 passes were made
using 25 gauge needles into the nodule within the right lobe of the
thyroid.  Ultrasound was used to confirm needle placements on all
four occasions.  Specimens were sent to Pathology for analysis.
Post procedural imaging demonstrated no hematoma or immediate
complication.  The patient tolerated the procedure well.
IMPRESSION: Successful right thyroid nodule aspirate as described above.

Read by: CAPTAIN.-CAPTAIN

## 2008-02-16 ENCOUNTER — Ambulatory Visit: Payer: Self-pay | Admitting: Family Medicine

## 2008-02-16 LAB — CONVERTED CEMR LAB
BUN: 16 mg/dL (ref 6–23)
CO2: 27 meq/L (ref 19–32)
Chloride: 103 meq/L (ref 96–112)
Creatinine, Ser: 0.69 mg/dL (ref 0.40–1.20)
Free T4: 1.23 ng/dL (ref 0.89–1.80)
Vit D, 1,25-Dihydroxy: 10 — ABNORMAL LOW (ref 30–89)

## 2008-04-20 ENCOUNTER — Encounter (INDEPENDENT_AMBULATORY_CARE_PROVIDER_SITE_OTHER): Payer: Self-pay | Admitting: Family Medicine

## 2008-04-20 ENCOUNTER — Ambulatory Visit: Payer: Self-pay | Admitting: Internal Medicine

## 2008-09-21 ENCOUNTER — Ambulatory Visit: Payer: Self-pay | Admitting: Family Medicine

## 2008-09-21 LAB — CONVERTED CEMR LAB
BUN: 15 mg/dL (ref 6–23)
CO2: 27 meq/L (ref 19–32)
Chloride: 102 meq/L (ref 96–112)
Eosinophils Absolute: 0.1 10*3/uL (ref 0.0–0.7)
Glucose, Bld: 94 mg/dL (ref 70–99)
LDL Cholesterol: 104 mg/dL — ABNORMAL HIGH (ref 0–99)
Lymphocytes Relative: 45 % (ref 12–46)
Lymphs Abs: 1.8 10*3/uL (ref 0.7–4.0)
MCV: 95.4 fL (ref 78.0–100.0)
Monocytes Relative: 8 % (ref 3–12)
Neutrophils Relative %: 44 % (ref 43–77)
Potassium: 3.8 meq/L (ref 3.5–5.3)
RBC: 3.88 M/uL (ref 3.87–5.11)
VLDL: 13 mg/dL (ref 0–40)
WBC: 4.1 10*3/uL (ref 4.0–10.5)

## 2008-11-16 ENCOUNTER — Ambulatory Visit: Payer: Self-pay | Admitting: Family Medicine

## 2008-11-21 ENCOUNTER — Emergency Department (HOSPITAL_COMMUNITY): Admission: EM | Admit: 2008-11-21 | Discharge: 2008-11-21 | Payer: Self-pay | Admitting: Emergency Medicine

## 2009-02-01 ENCOUNTER — Ambulatory Visit: Payer: Self-pay | Admitting: Family Medicine

## 2009-03-01 ENCOUNTER — Ambulatory Visit: Payer: Self-pay | Admitting: Family Medicine

## 2009-03-15 ENCOUNTER — Ambulatory Visit: Payer: Self-pay | Admitting: Internal Medicine

## 2009-03-15 ENCOUNTER — Encounter (INDEPENDENT_AMBULATORY_CARE_PROVIDER_SITE_OTHER): Payer: Self-pay | Admitting: Family Medicine

## 2009-03-22 ENCOUNTER — Ambulatory Visit (HOSPITAL_COMMUNITY): Admission: RE | Admit: 2009-03-22 | Discharge: 2009-03-22 | Payer: Self-pay | Admitting: Family Medicine

## 2009-03-22 IMAGING — CR DG LUMBAR SPINE COMPLETE 4+V
9 series · 9 of 9 positions shown · non-contrast
Comparison: [DATE]

CLINICAL DATA: Low back pain and pelvic pain

LUMBAR SPINE - COMPLETE 4+ VIEW

[t l-spine a.p.]
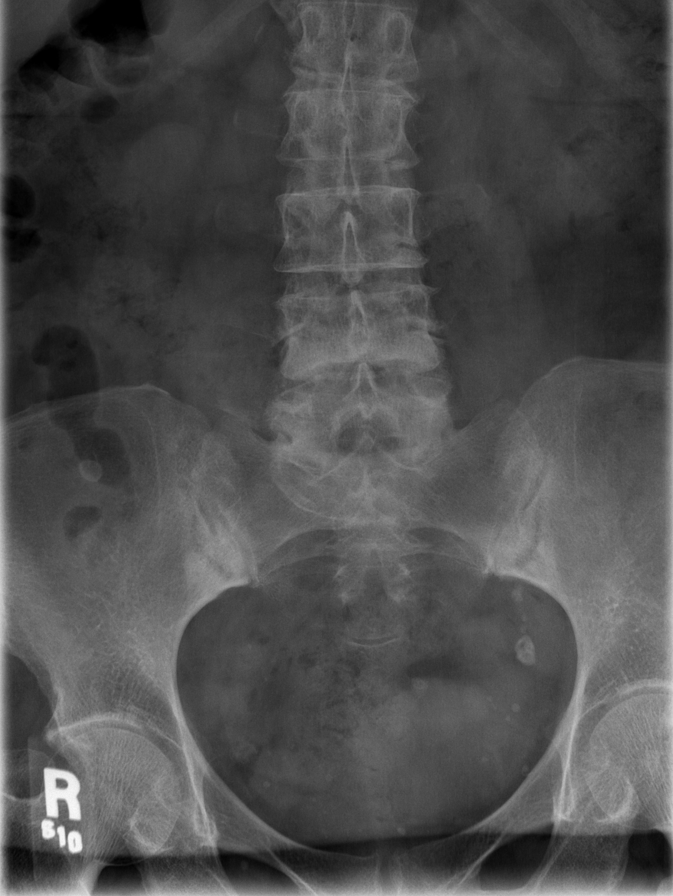

[t l-spine oblique exposure (1 of 5)]
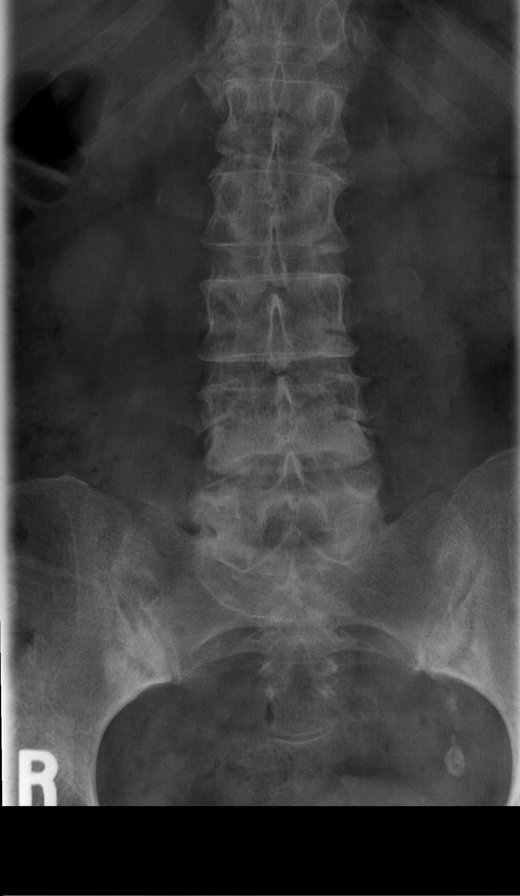

[t l-spine oblique exposure (2 of 5)]
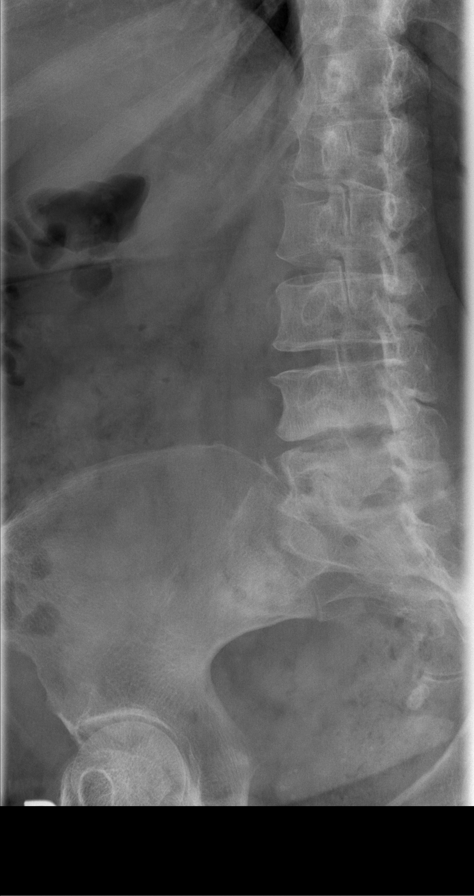

[t l-spine oblique exposure (3 of 5)]
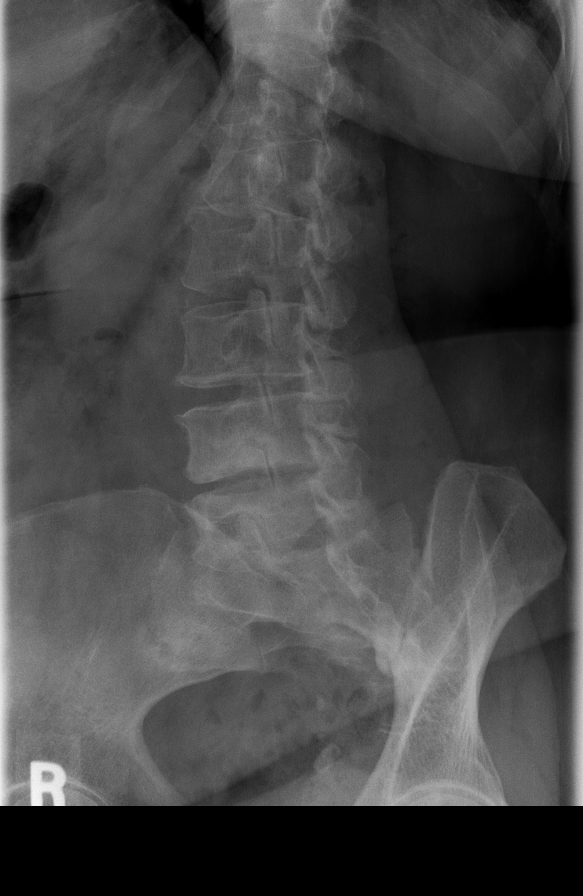

[t l-spine oblique exposure (4 of 5)]
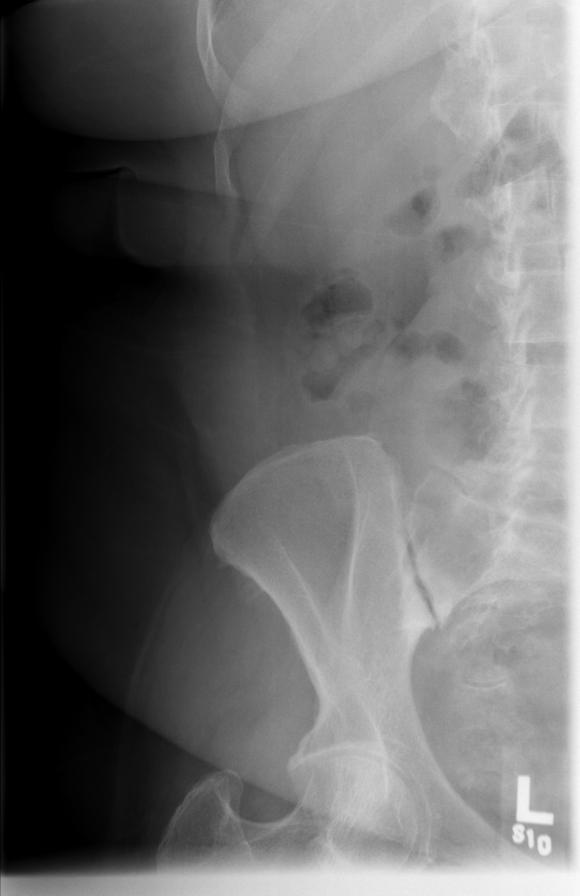

[t l-spine oblique exposure (5 of 5)]
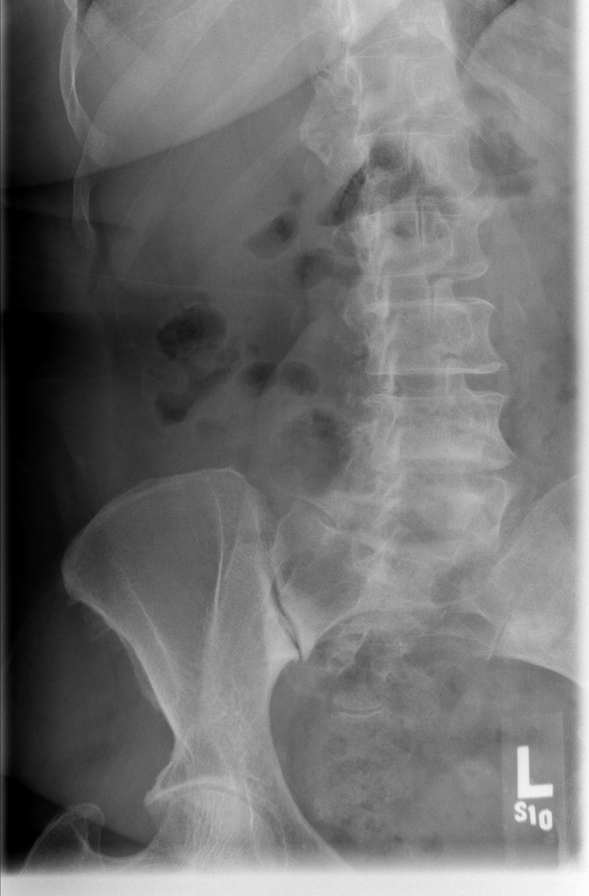

[t l-spine lat]
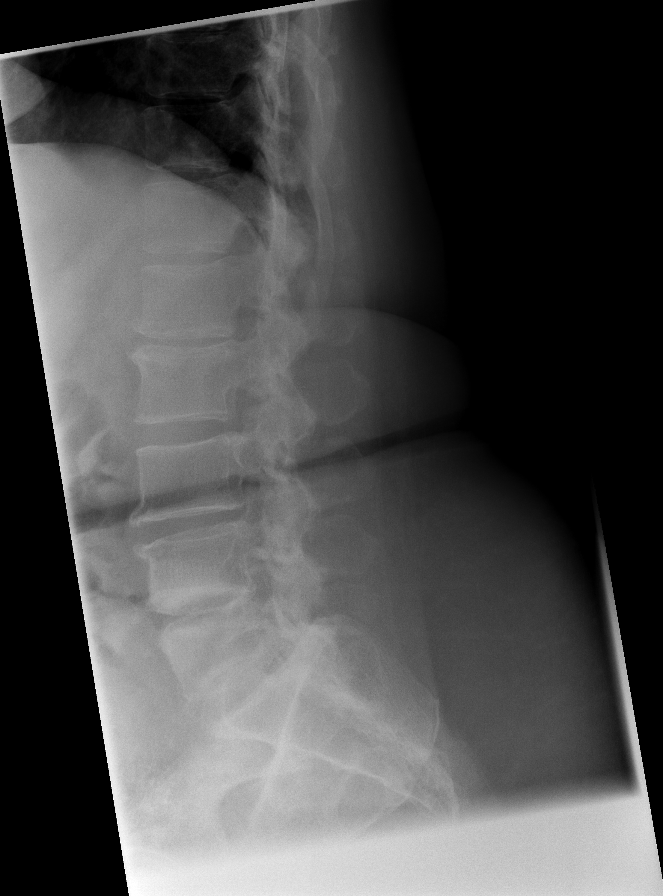

[t l-spine l5-s1 spot (1 of 2)]
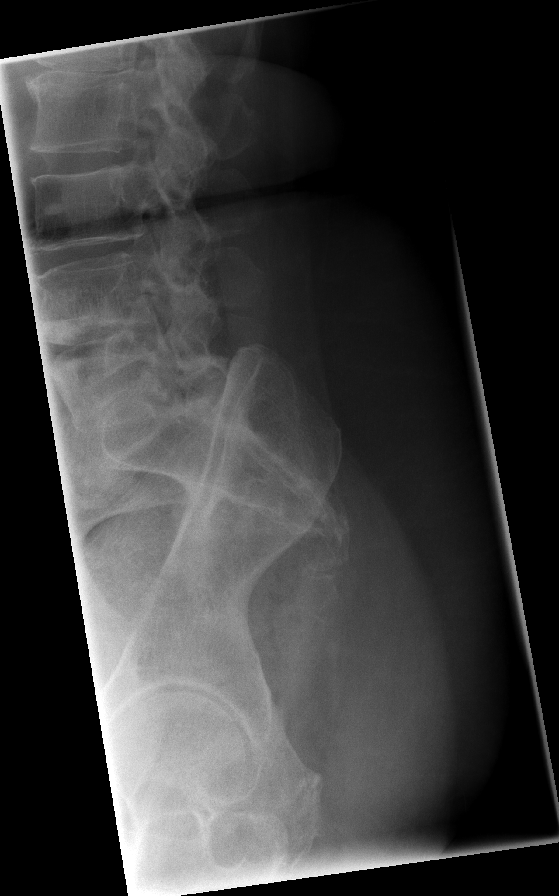

[t l-spine l5-s1 spot (2 of 2)]
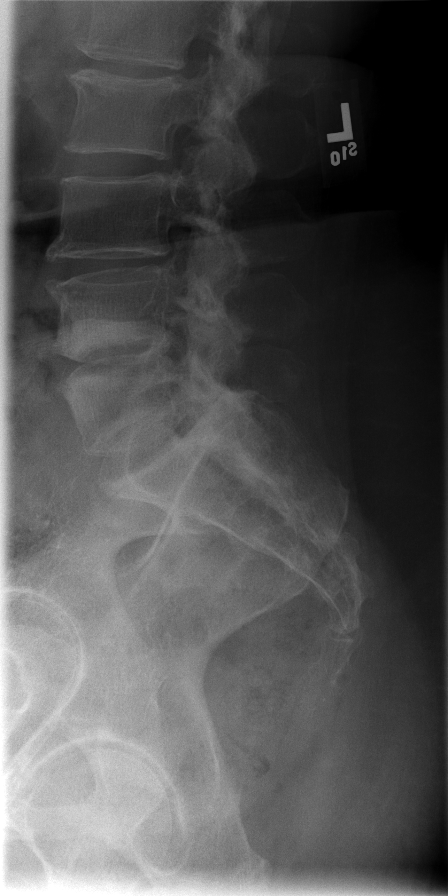

[9 of 9 positions shown; findings below may reference images not displayed]

FINDINGS: Normal lumbar alignment.  There is no fracture or mass
lesion.  There is discogenic sclerosis at L4-5.  There is mild disc
space narrowing at L3-4, L4-5.  There is sclerosis of the SI joints
compatible with osteoarthritis, similar to the prior study.  Mild
dextroscoliosis.  Negative for pars defect.
IMPRESSION: Lumbar disc degeneration at L4-5 and L5-S1, unchanged from the
prior study.  No acute bony abnormality.

## 2009-03-22 IMAGING — CR DG SI JOINTS 3+V
3 series · 3 of 3 positions shown · non-contrast
Comparison: [DATE]

CLINICAL DATA: Low back pain and pelvic pain

BILATERAL SACROILIAC JOINTS - 3+ VIEW

[t sacroiliac joints (1 of 3)]
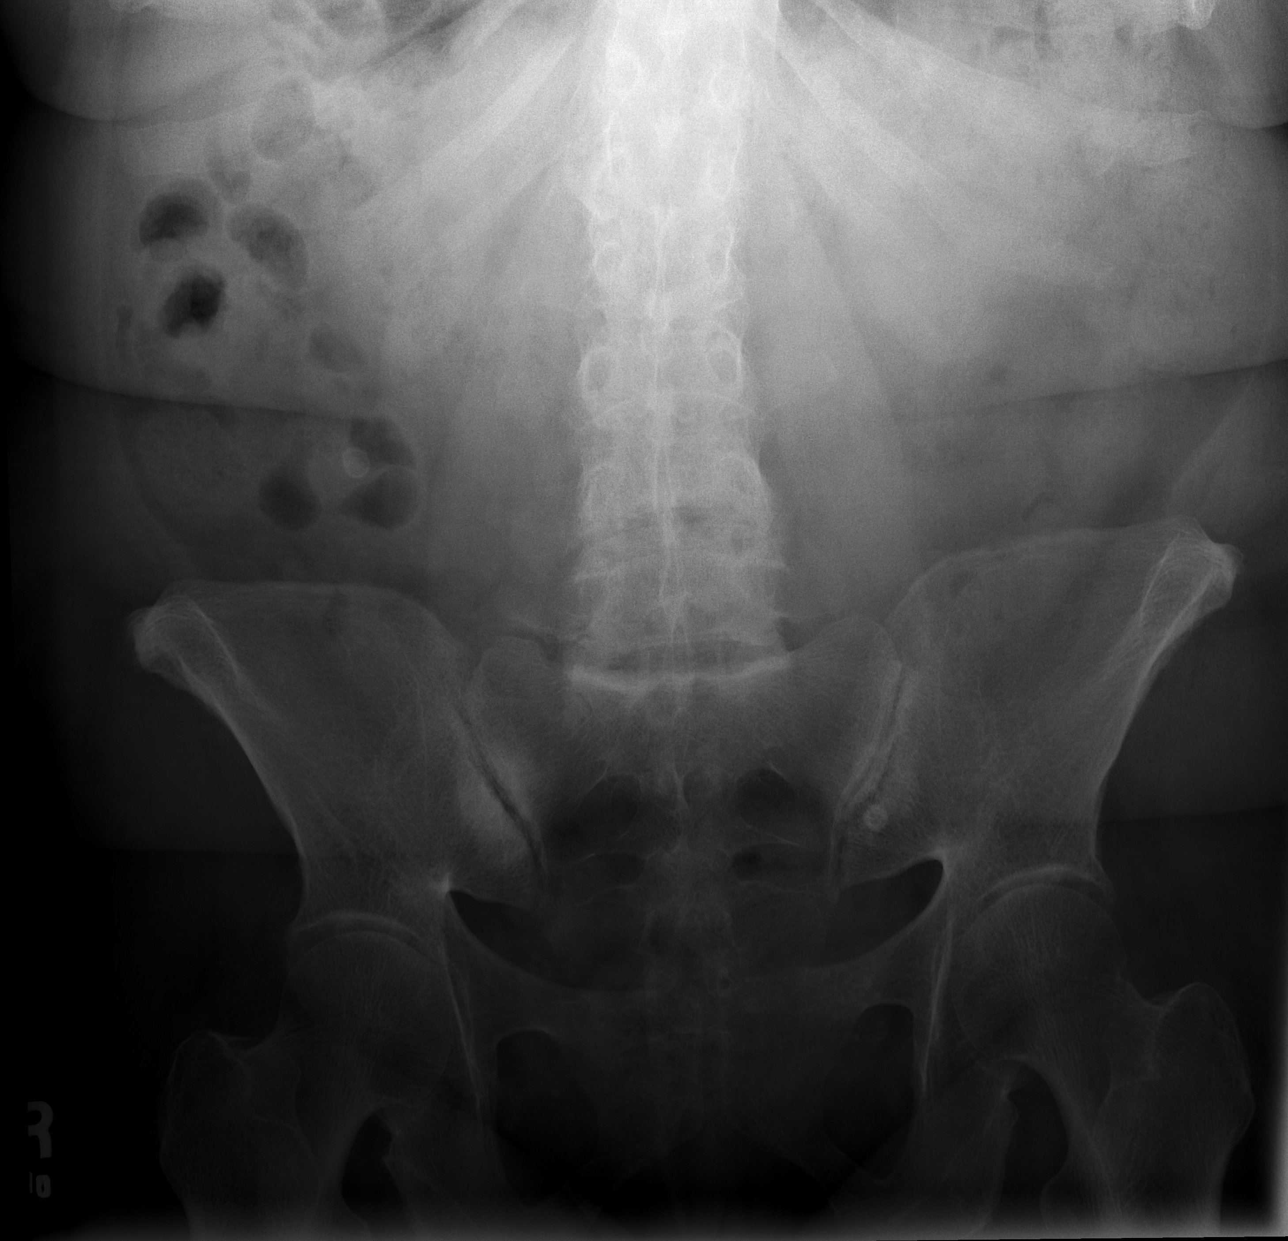

[t sacroiliac joints (2 of 3)]
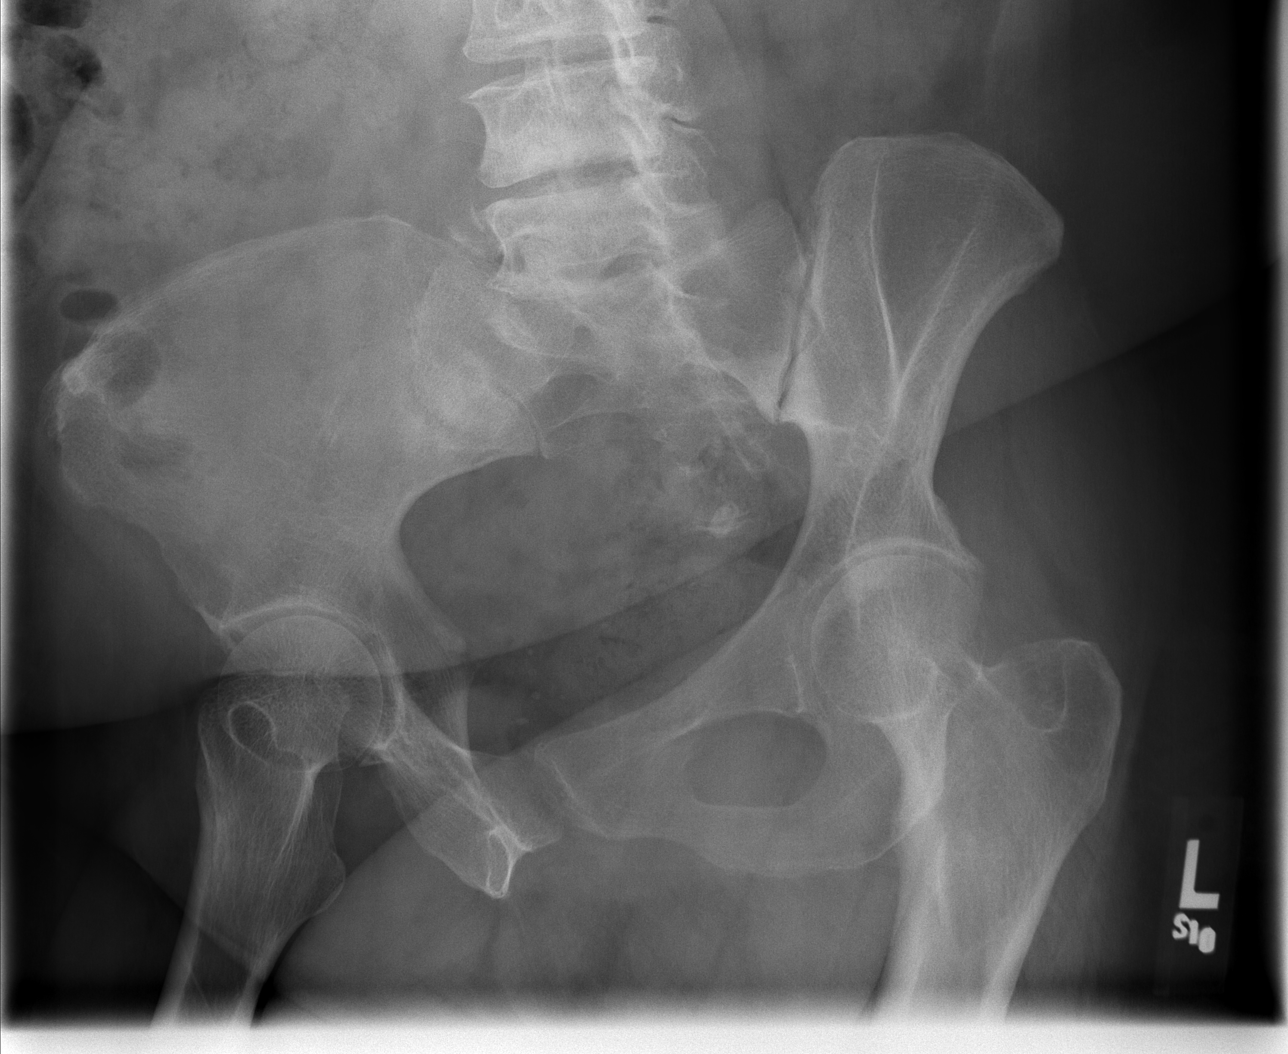

[t sacroiliac joints (3 of 3)]
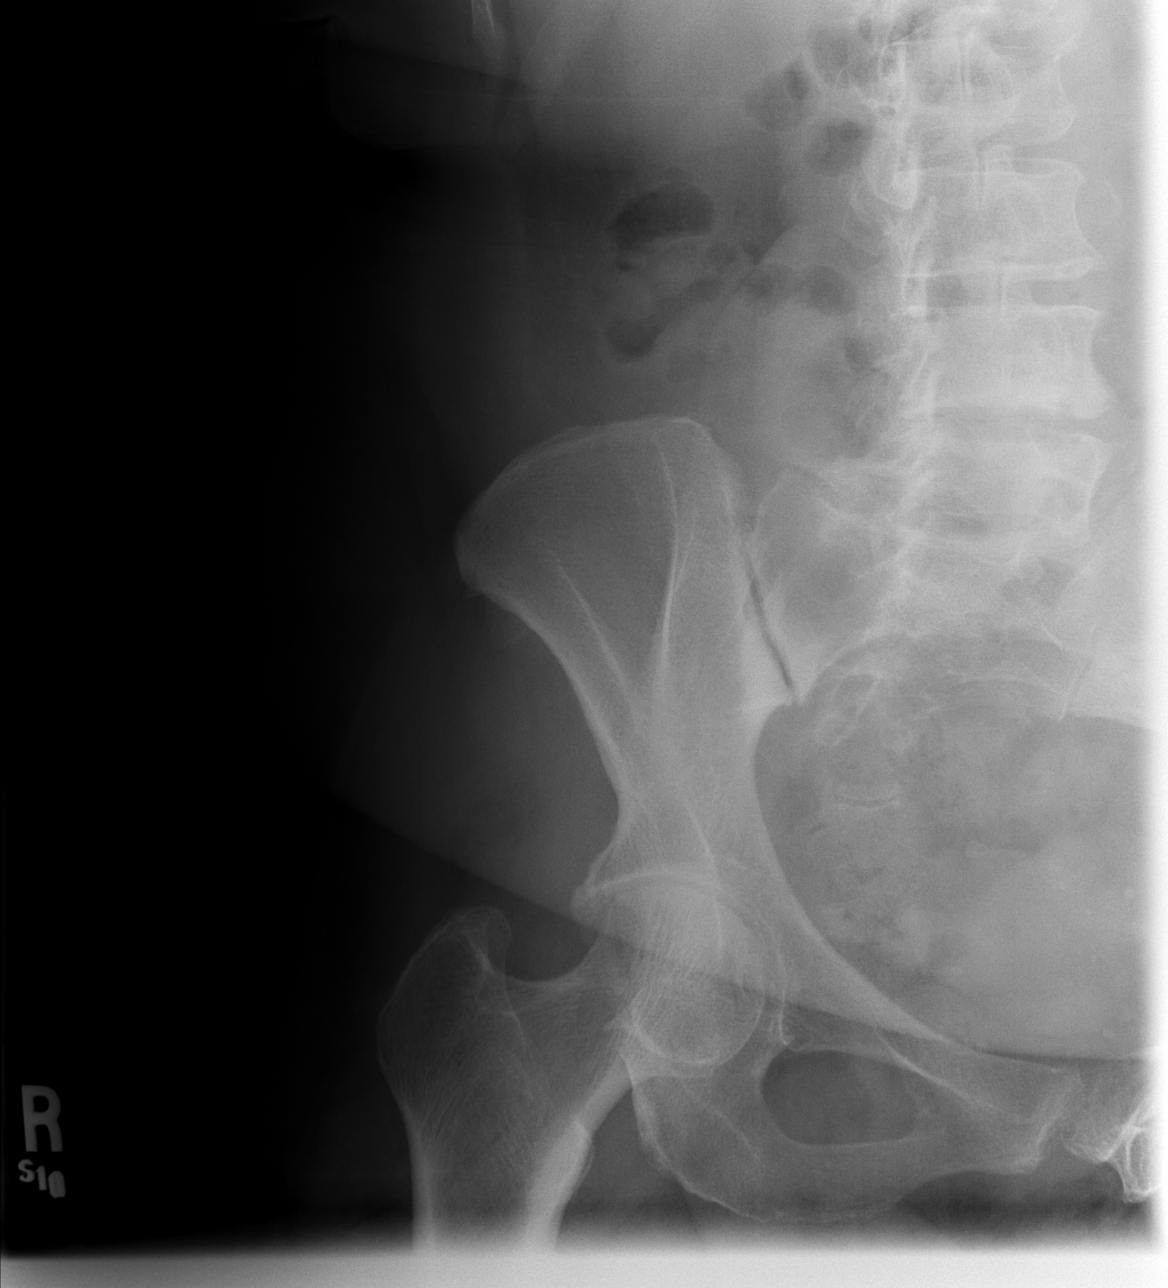

[3 of 3 positions shown; findings below may reference images not displayed]

FINDINGS: There is sclerosis around the SI joints, right greater
than left.  This is related to osteoarthritis and spurring.  This
is unchanged from the prior study.  There is no acute fracture or
mass lesion.  There is no ankylosis of the SI joints.
IMPRESSION: Moderate to advanced osteoarthritis of the SI joints, right greater
than left.

## 2009-03-22 IMAGING — MG MM DIGITAL SCREENING BILAT
6 series · 6 of 6 positions shown · non-contrast
Comparison: Prior studies.

DG SCREEN MAMMOGRAM BILATERAL
Bilateral CC and MLO view(s) were taken.
Technologist: LANN.(LANN)(M)

DIGITAL SCREENING MAMMOGRAM WITH CAD:

[R CC (1 of 2)]
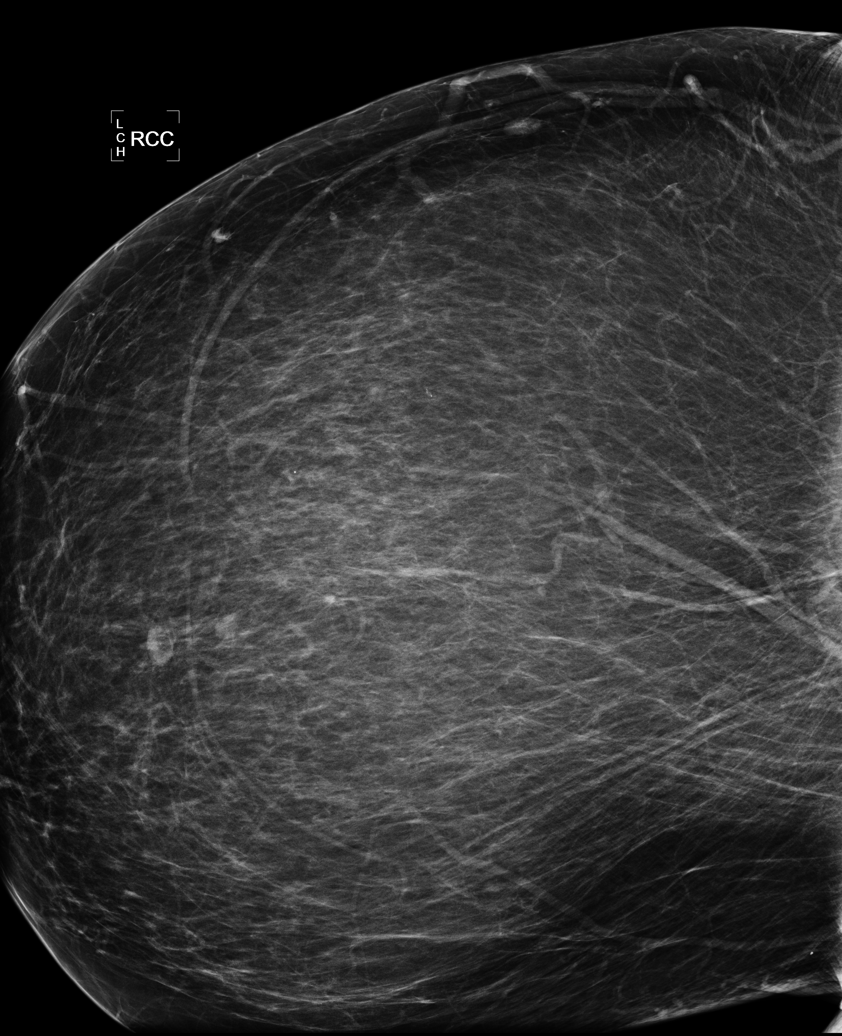

[L CC (1 of 3)]
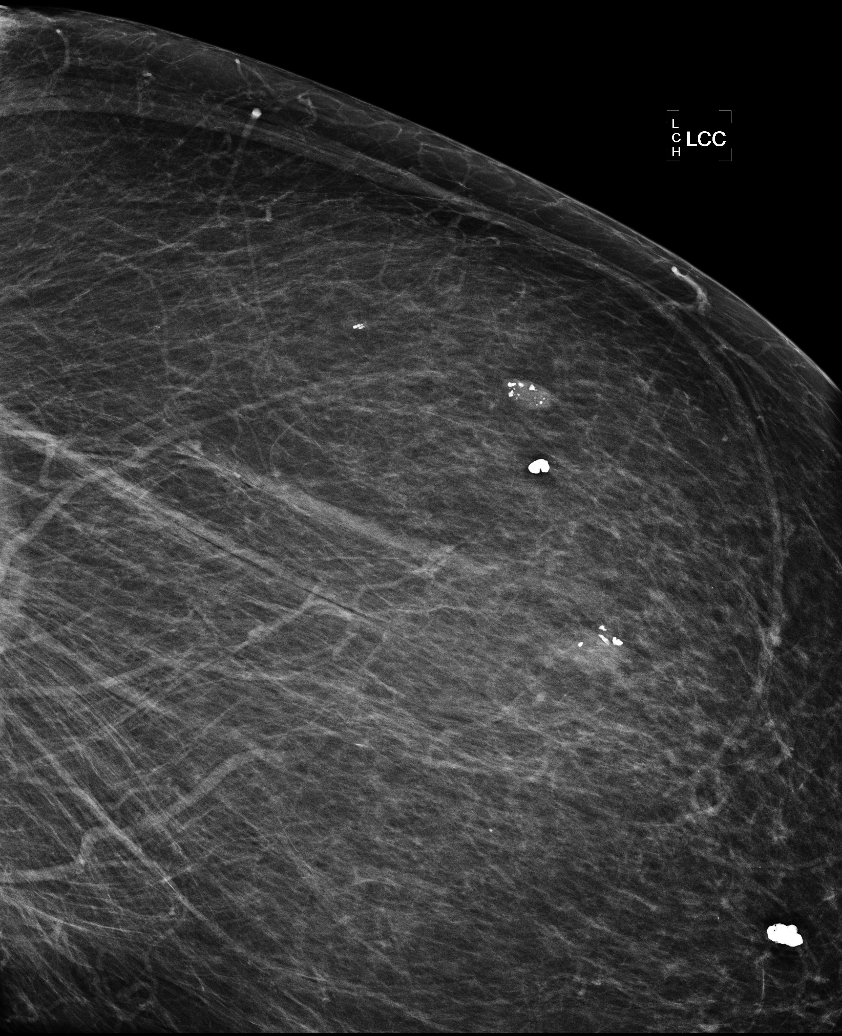

[L MLO]
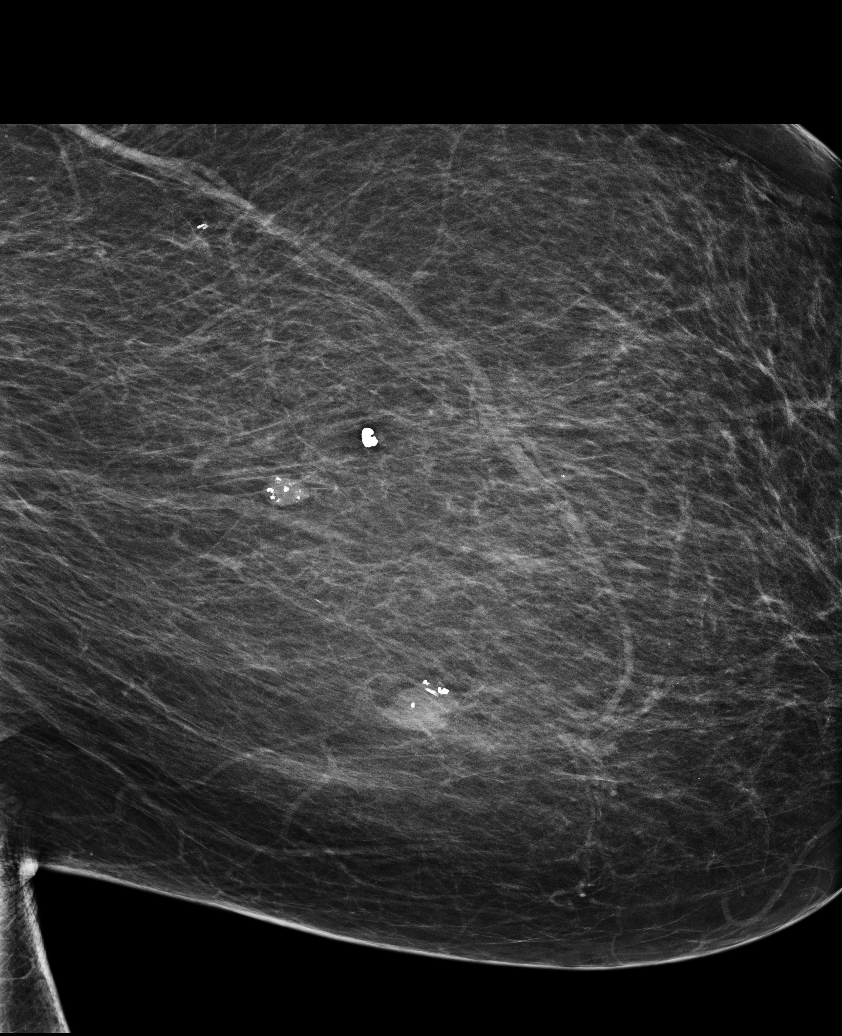

[R CC (2 of 2)]
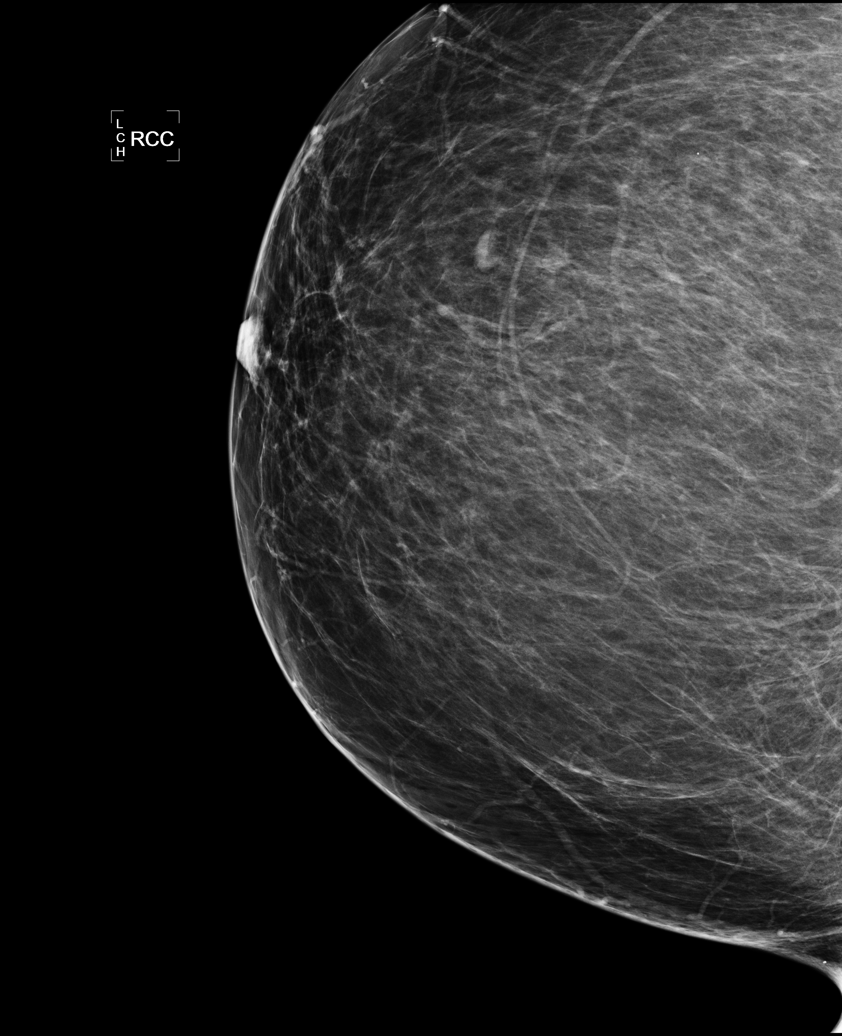

[L CC (2 of 3)]
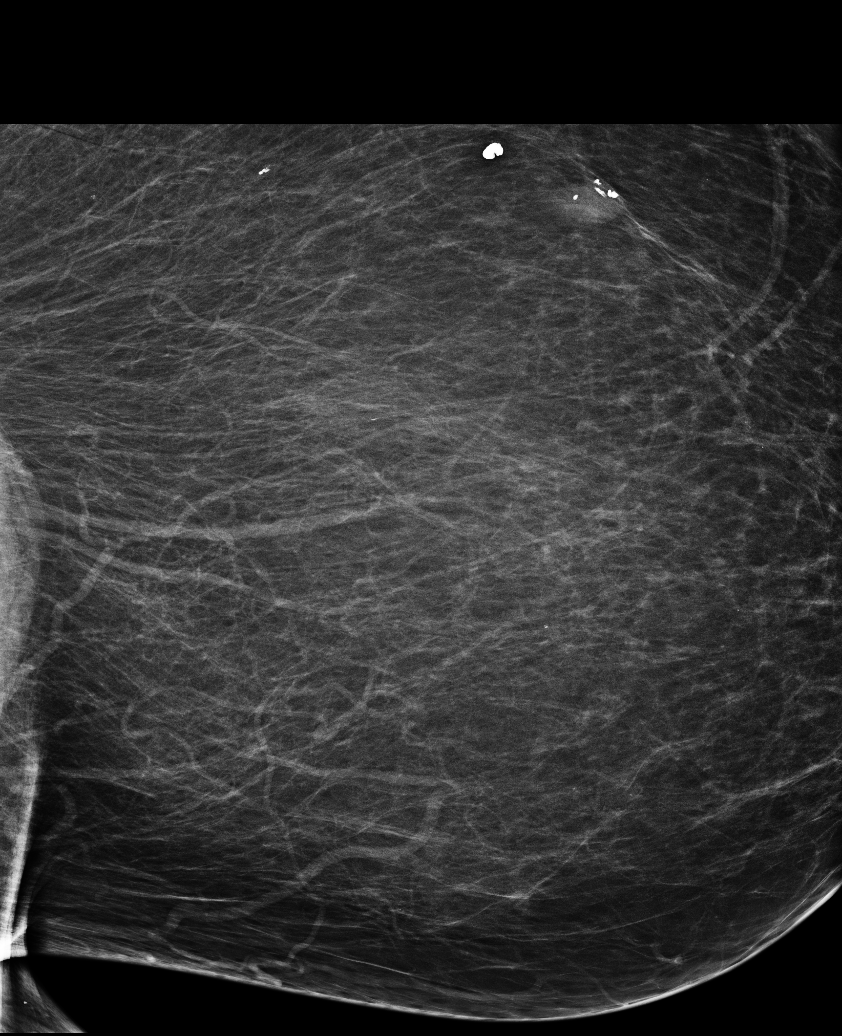

[L CC (3 of 3)]
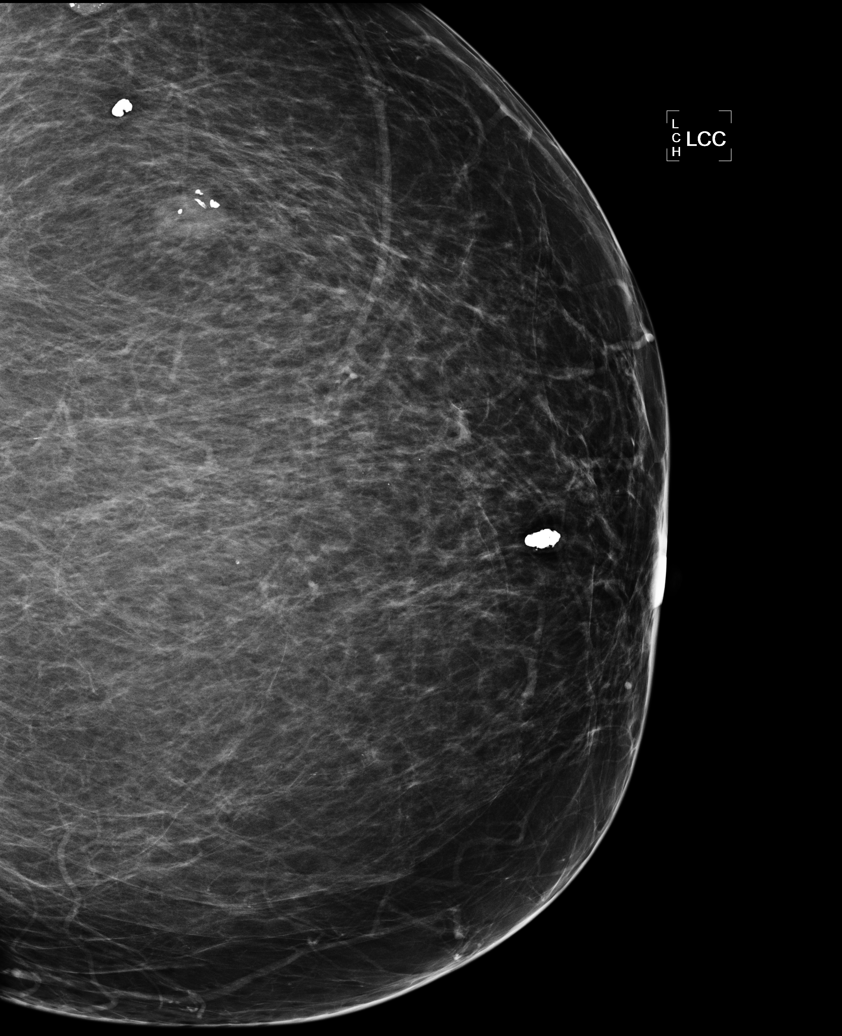

[6 of 6 positions shown; findings below may reference images not displayed]

There are scattered fibroglandular densities.  There is no dominant mass, architectural distortion 
or calcification to suggest malignancy.

Images were processed with CAD.
IMPRESSION: No mammographic evidence of malignancy.  Suggest yearly screening mammography.

A result letter of this screening mammogram will be mailed directly to the patient.

ASSESSMENT: Negative - BI-RADS 1

Screening mammogram in 1 year.
ANALYZED BY COMPUTER AIDED DETECTION. , THIS PROCEDURE WAS A DIGITAL MAMMOGRAM.

## 2009-03-23 ENCOUNTER — Ambulatory Visit: Payer: Self-pay | Admitting: Internal Medicine

## 2009-09-23 ENCOUNTER — Ambulatory Visit: Payer: Self-pay | Admitting: Family Medicine

## 2009-10-03 ENCOUNTER — Telehealth (INDEPENDENT_AMBULATORY_CARE_PROVIDER_SITE_OTHER): Payer: Self-pay | Admitting: *Deleted

## 2009-10-04 ENCOUNTER — Ambulatory Visit: Payer: Self-pay | Admitting: Family Medicine

## 2009-10-04 LAB — CONVERTED CEMR LAB
Calcium: 9.8 mg/dL (ref 8.4–10.5)
Hgb A1c MFr Bld: 5.8 % (ref 4.6–6.1)
Potassium: 4 meq/L (ref 3.5–5.3)
Sodium: 135 meq/L (ref 135–145)
Vit D, 25-Hydroxy: 19 ng/mL — ABNORMAL LOW (ref 30–89)

## 2010-02-17 ENCOUNTER — Ambulatory Visit: Payer: Self-pay | Admitting: Family Medicine

## 2010-02-17 ENCOUNTER — Ambulatory Visit (HOSPITAL_COMMUNITY): Admission: RE | Admit: 2010-02-17 | Discharge: 2010-02-17 | Payer: Self-pay | Admitting: Family Medicine

## 2010-02-17 IMAGING — CR DG CHEST 2V
2 series · 2 of 2 positions shown · non-contrast
Comparison: [DATE]

CLINICAL DATA: Chest pain

CHEST - 2 VIEW

[w chest pa]
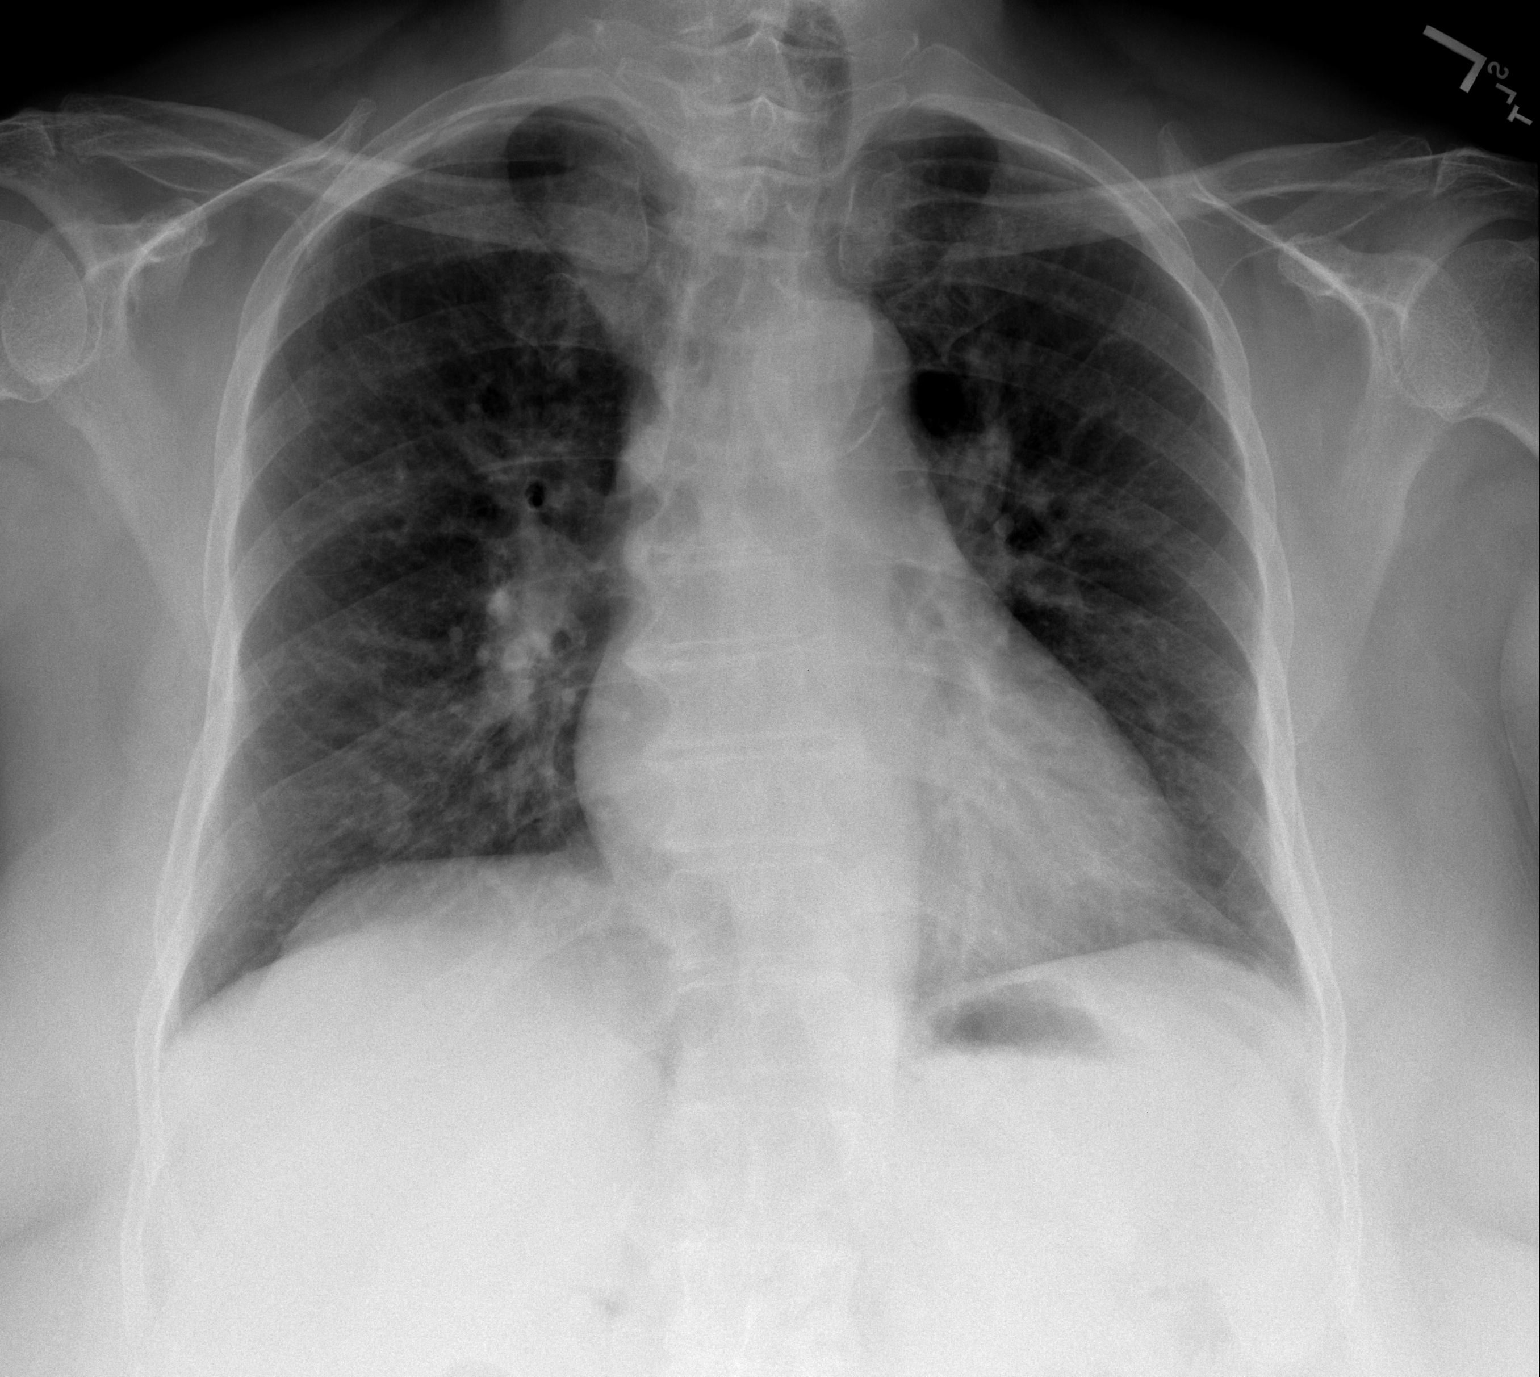

[w chest lat]
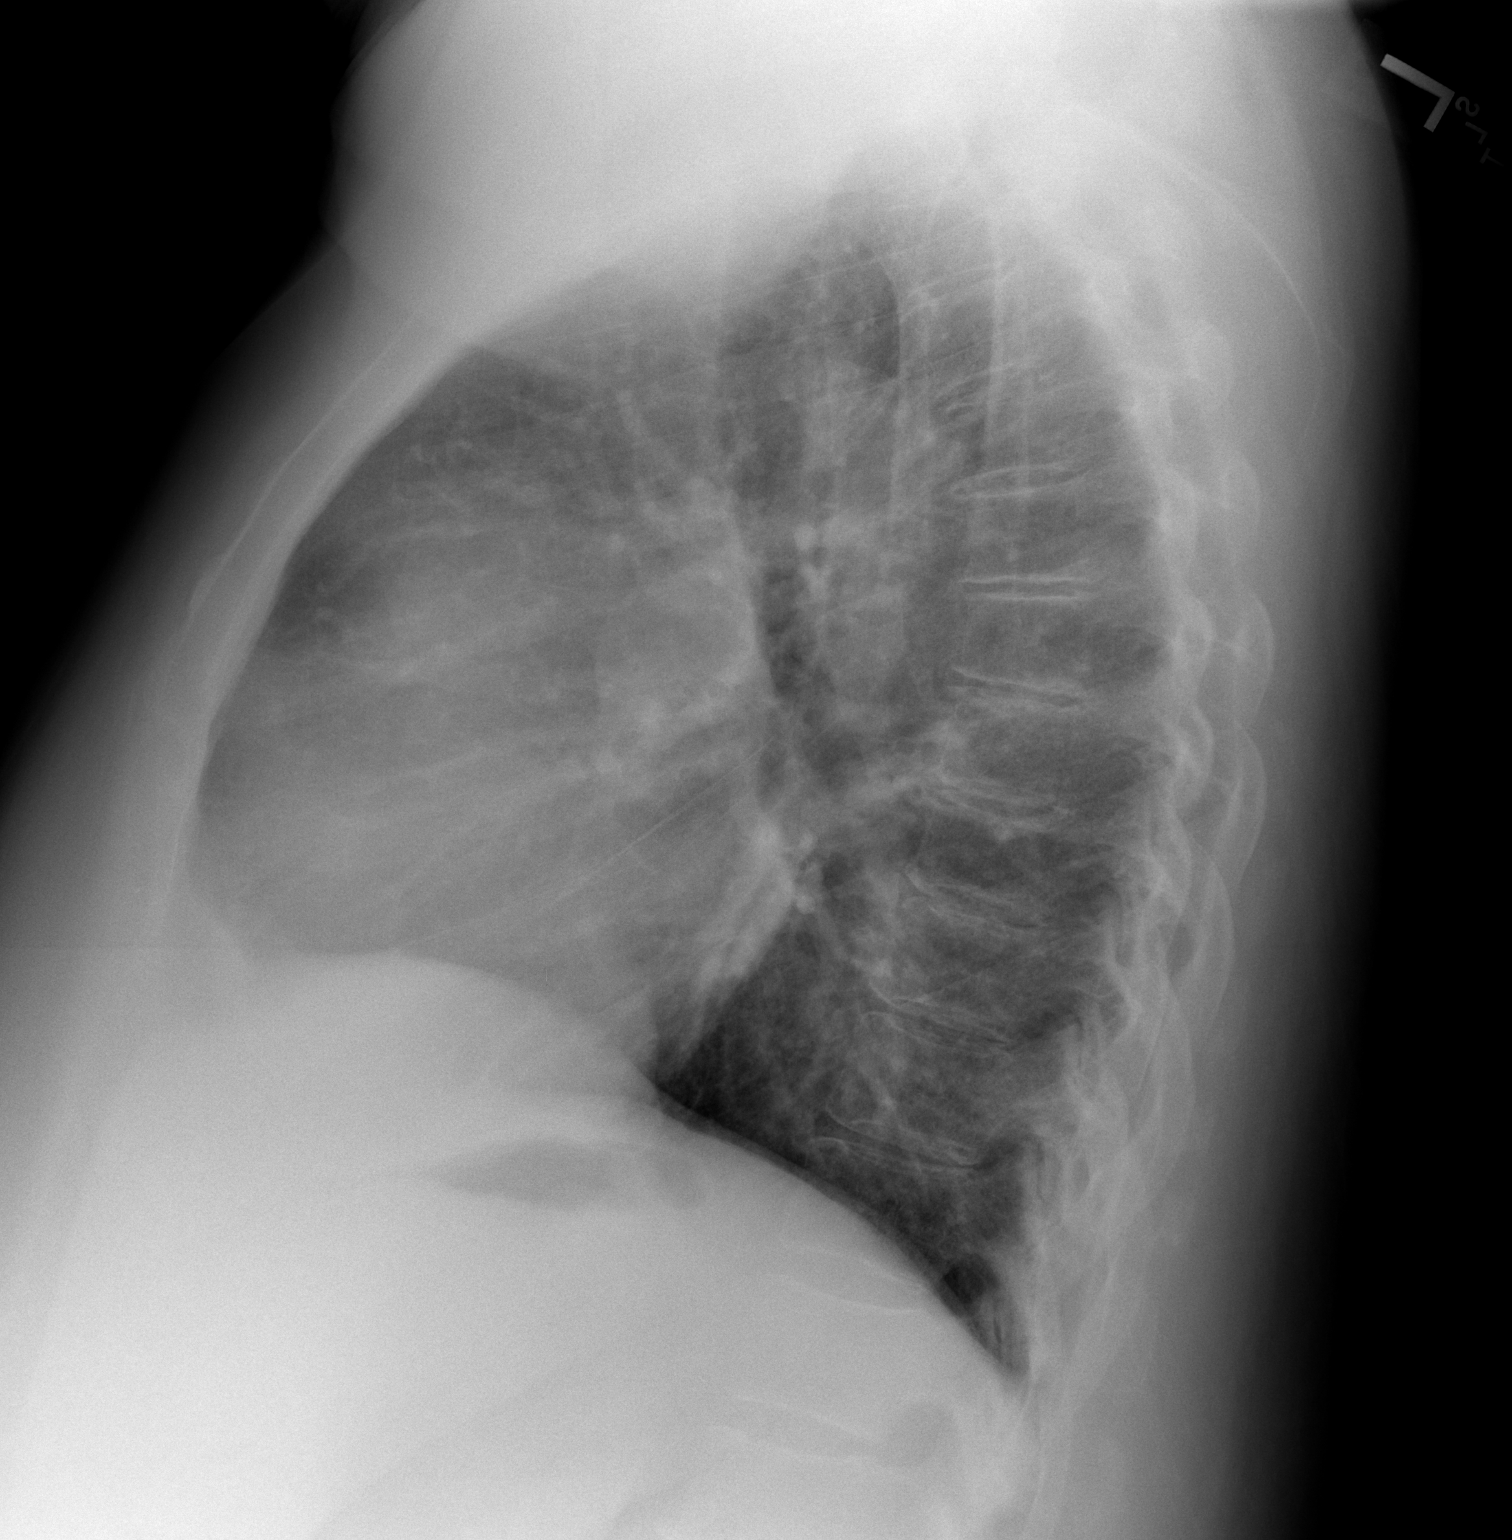

[2 of 2 positions shown; findings below may reference images not displayed]

FINDINGS: S-shaped scoliosis of the thoracic spine is noted.
Borderline enlargement of the cardiomediastinal silhouette noted
with central vascular congestion but no overt edema.  No pleural
effusion. Lung volumes are low with crowding of the bronchovascular
markings.
IMPRESSION: No focal acute finding.  Borderline cardiomegaly.

## 2010-02-17 IMAGING — CR DG SHOULDER 2+V*L*
4 series · 4 of 4 positions shown · non-contrast
Comparison: None.

CLINICAL DATA: Shoulder pain

LEFT SHOULDER - 2+ VIEW

[w shoulder ap external left]
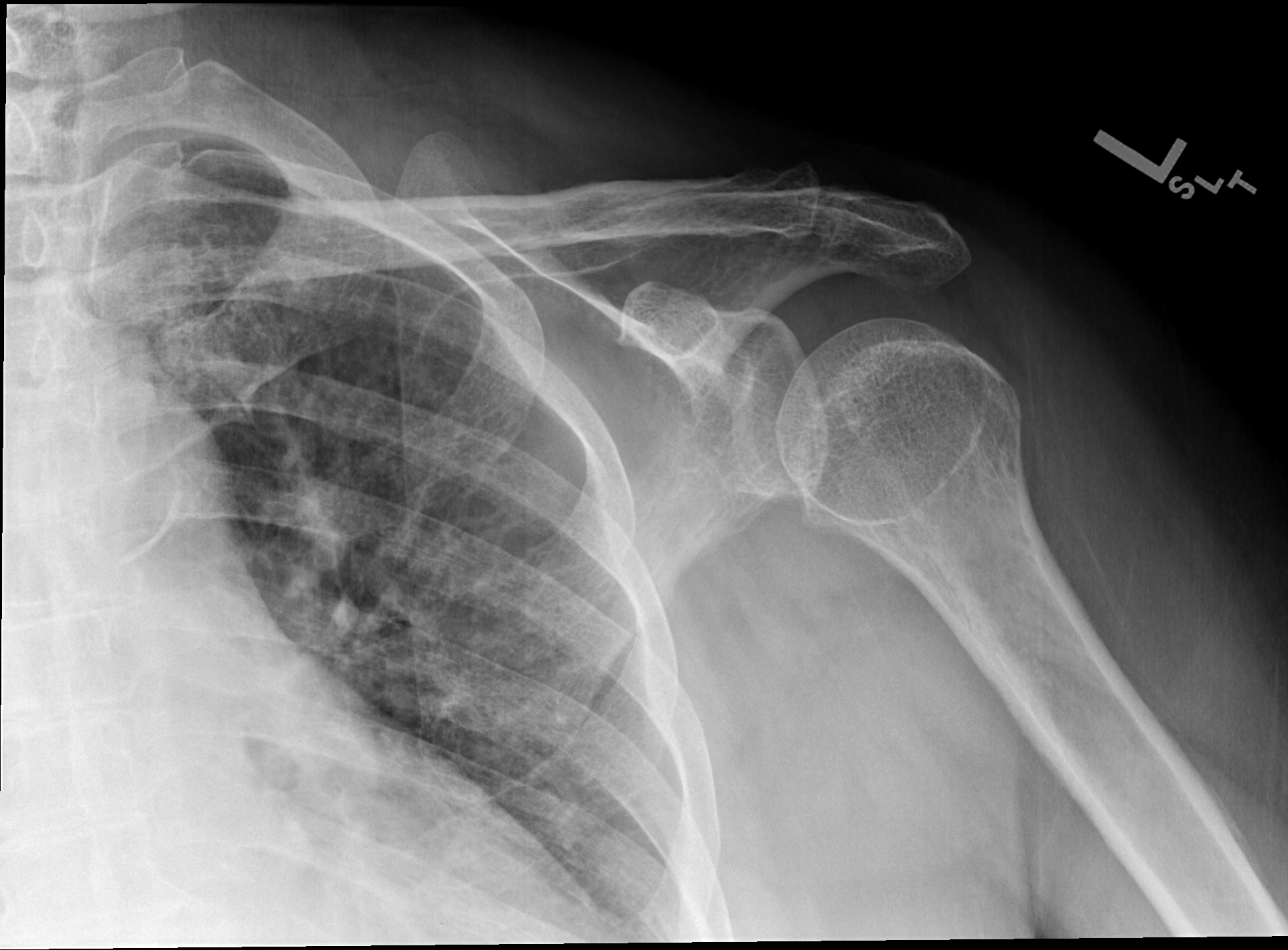

[w shoulder y view left * (1 of 2)]
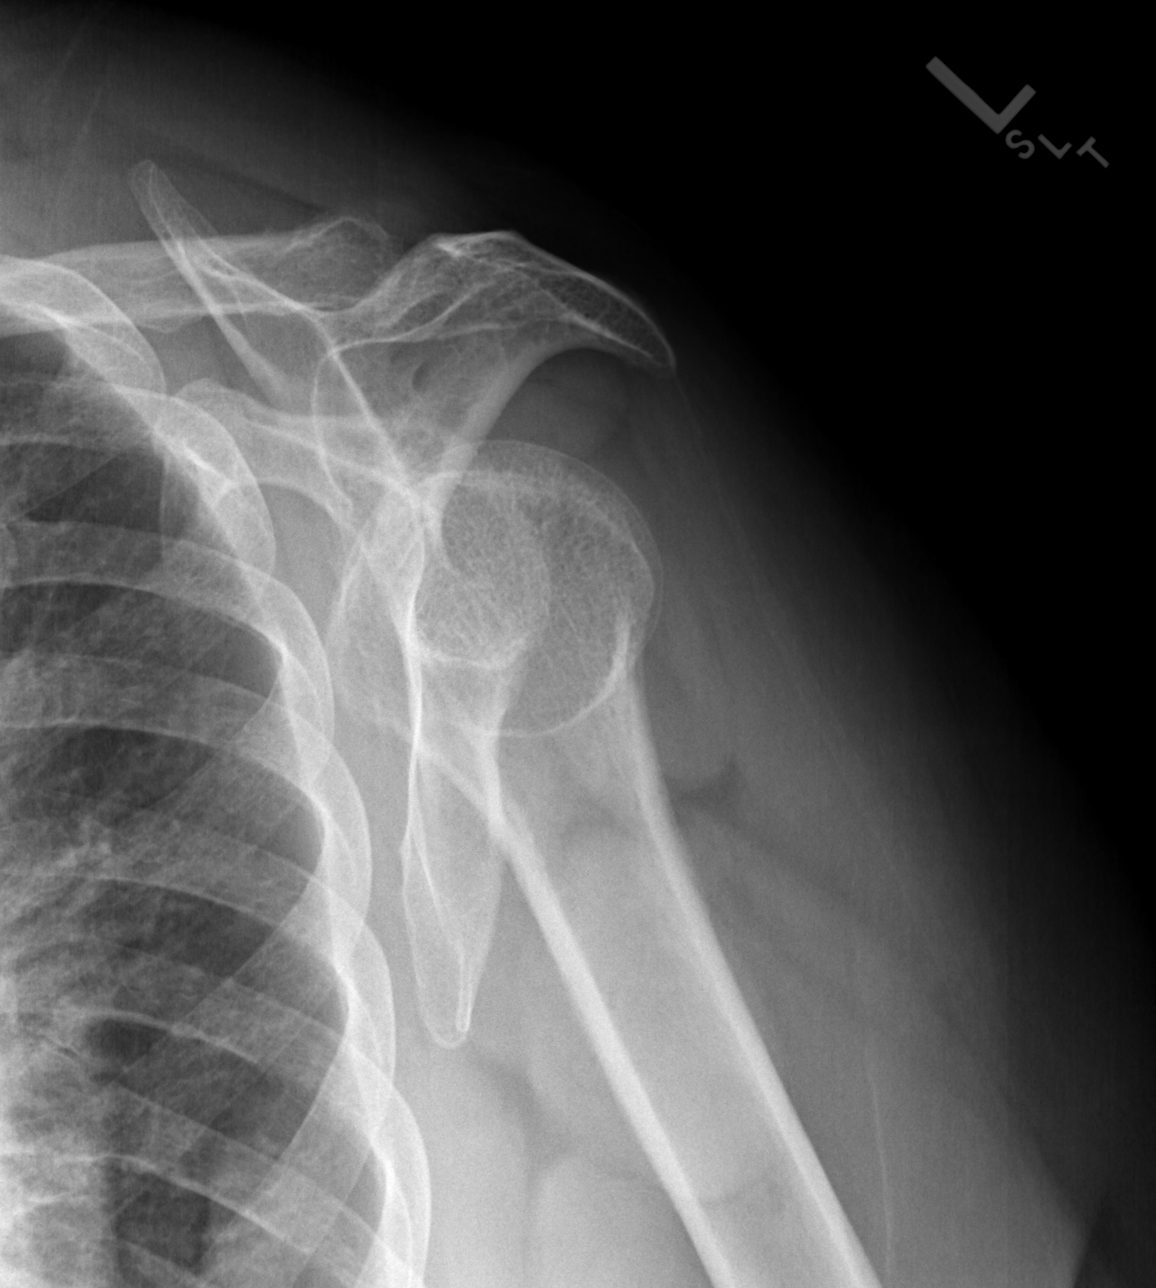

[w shoulder y view left * (2 of 2)]
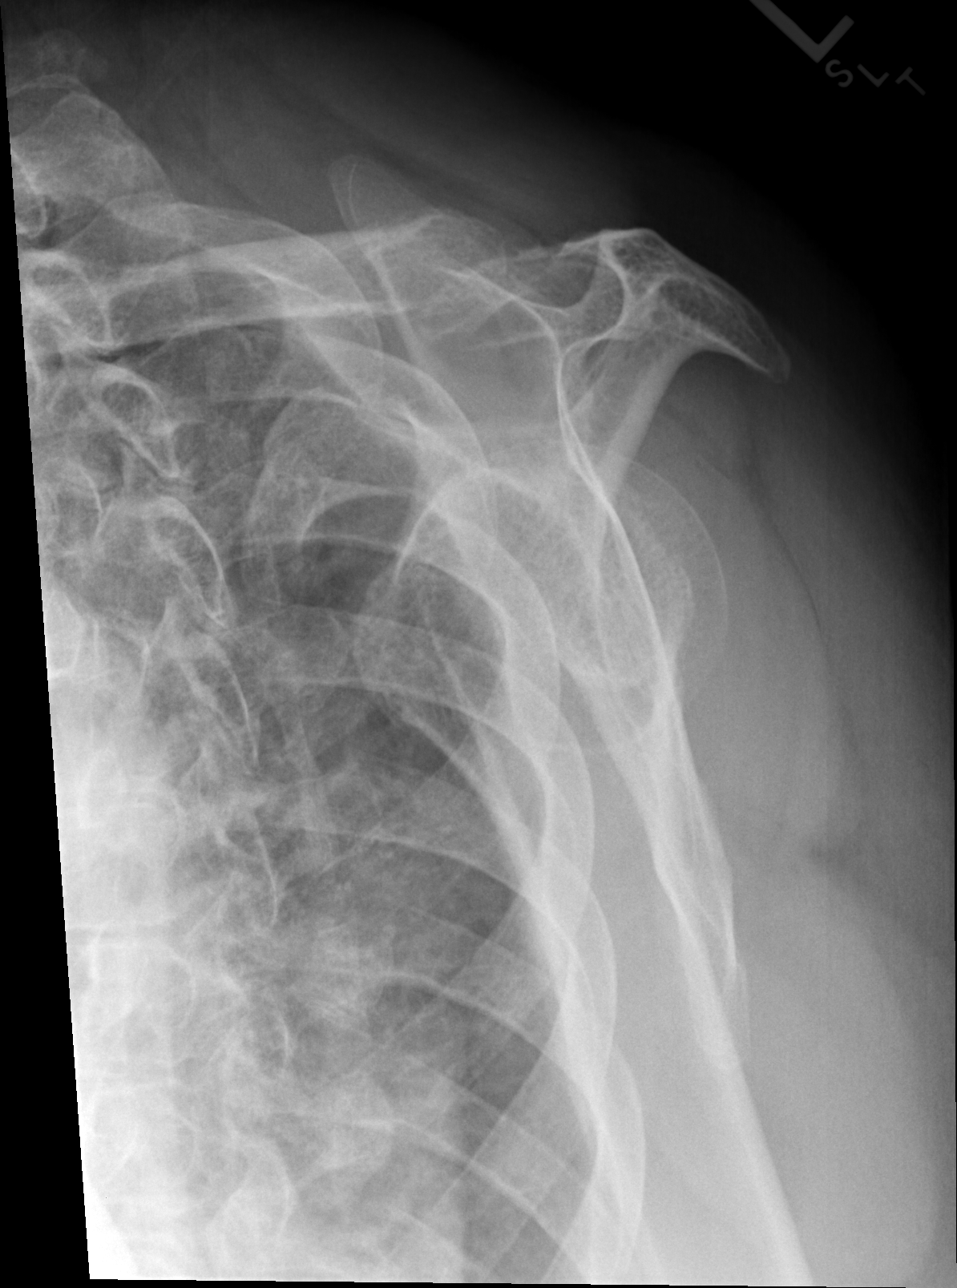

[w shoulder ap internal left]
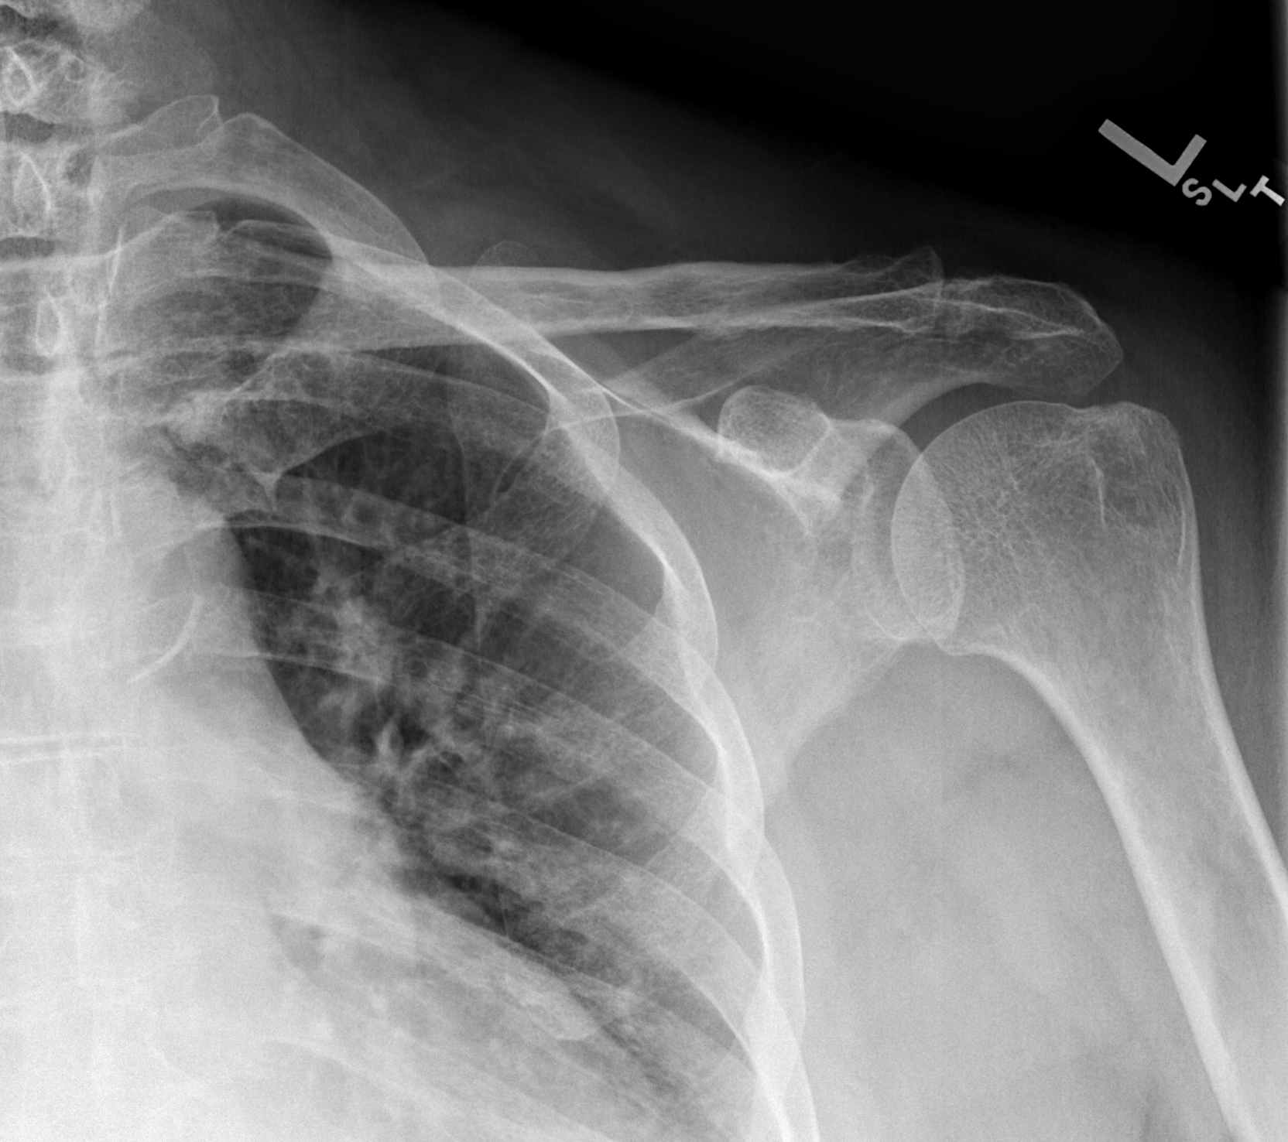

[4 of 4 positions shown; findings below may reference images not displayed]

FINDINGS: No fracture or dislocation.  No soft tissue abnormality.
No radiopaque foreign body. Left lung apex is grossly clear.
IMPRESSION: Normal exam.

## 2010-02-17 IMAGING — CR DG SHOULDER 2+V*R*
4 series · 4 of 4 positions shown · non-contrast
Comparison: None.

CLINICAL DATA: Shoulder pain

RIGHT SHOULDER - 2+ VIEW

[w shoulder ap internal righ]
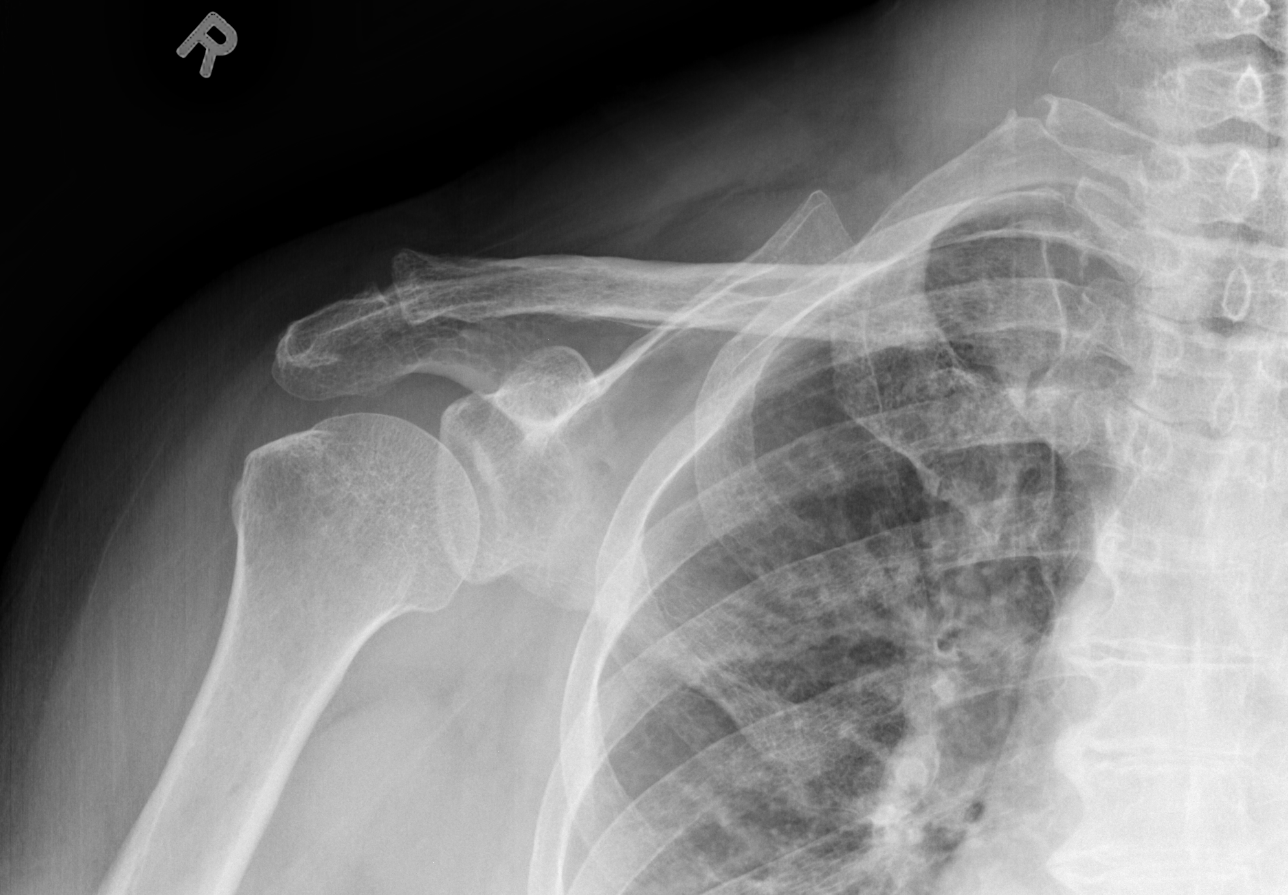

[w shoulder ap external righ]
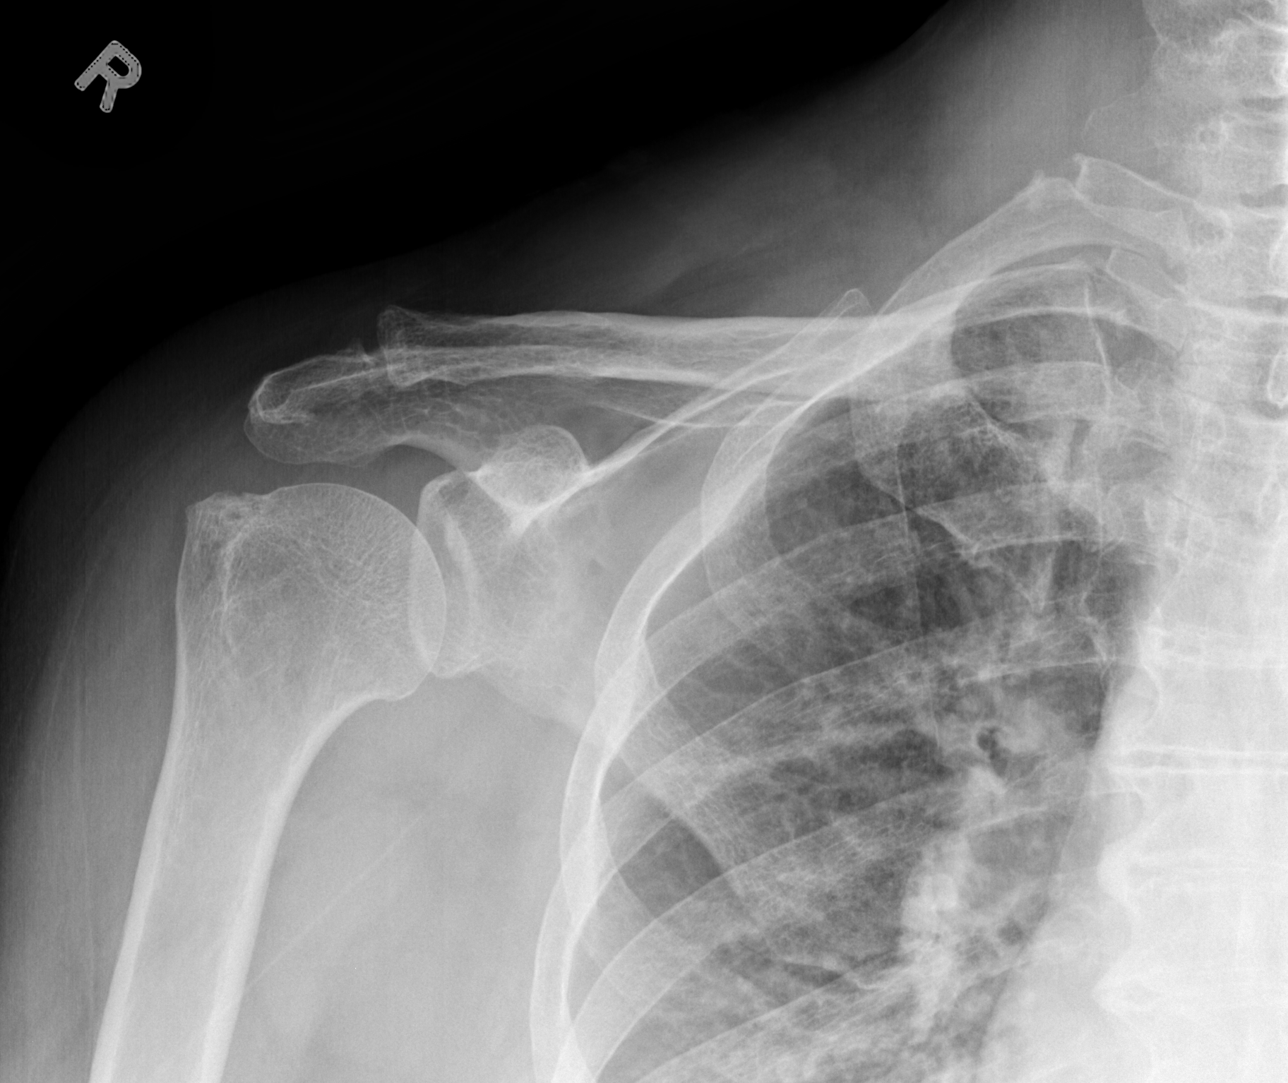

[w shoulder y view right *]
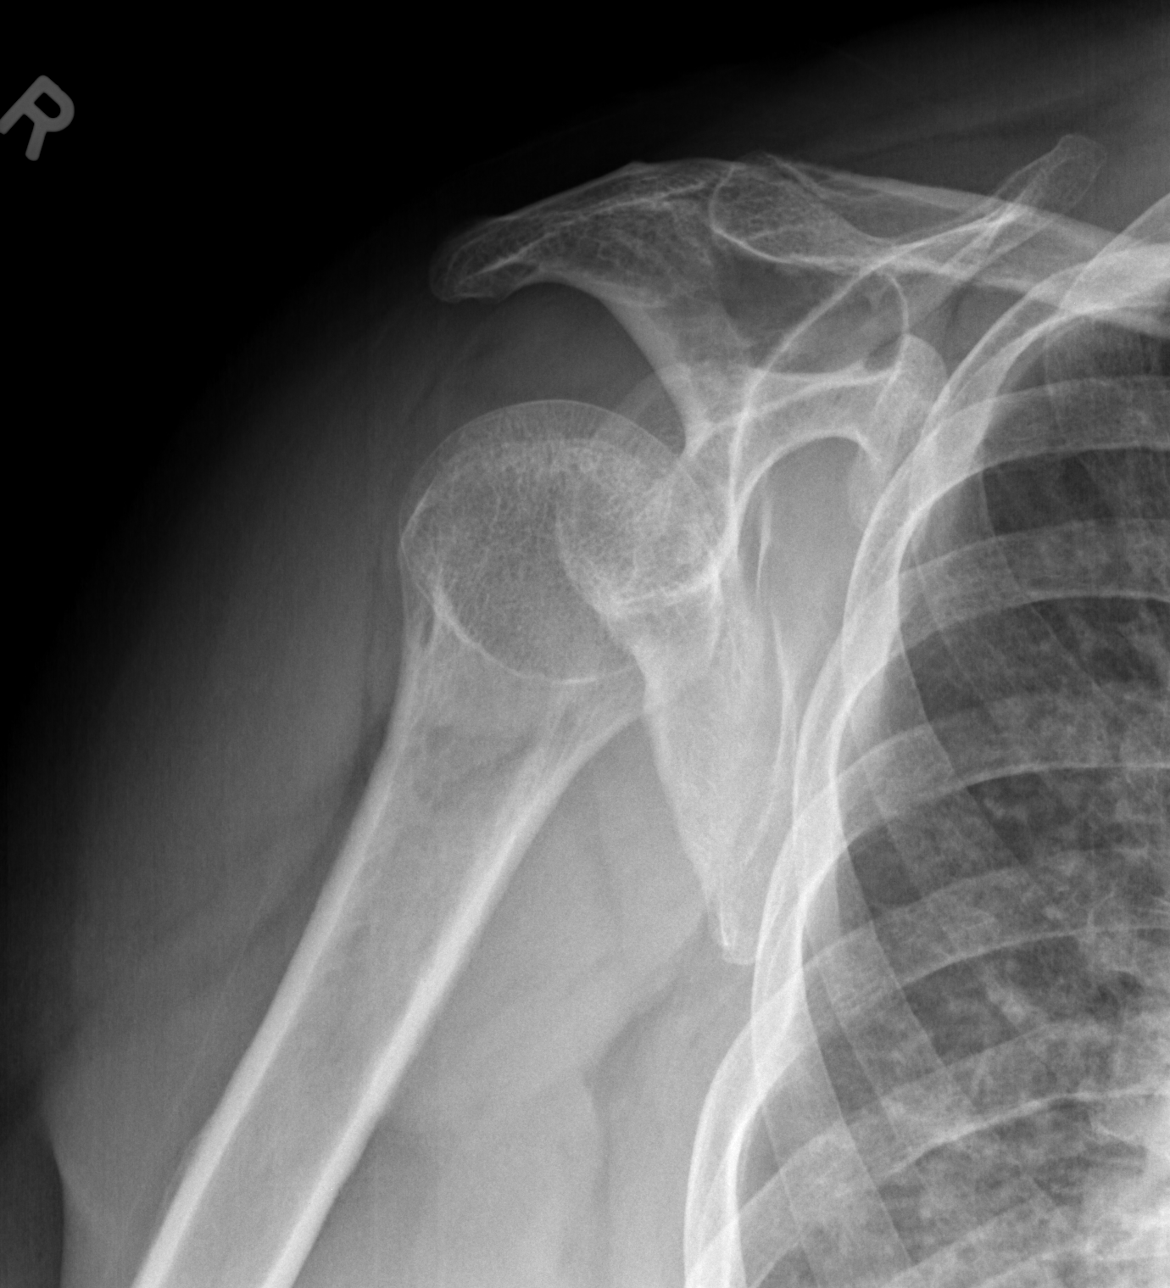

[w shoulder y view right]
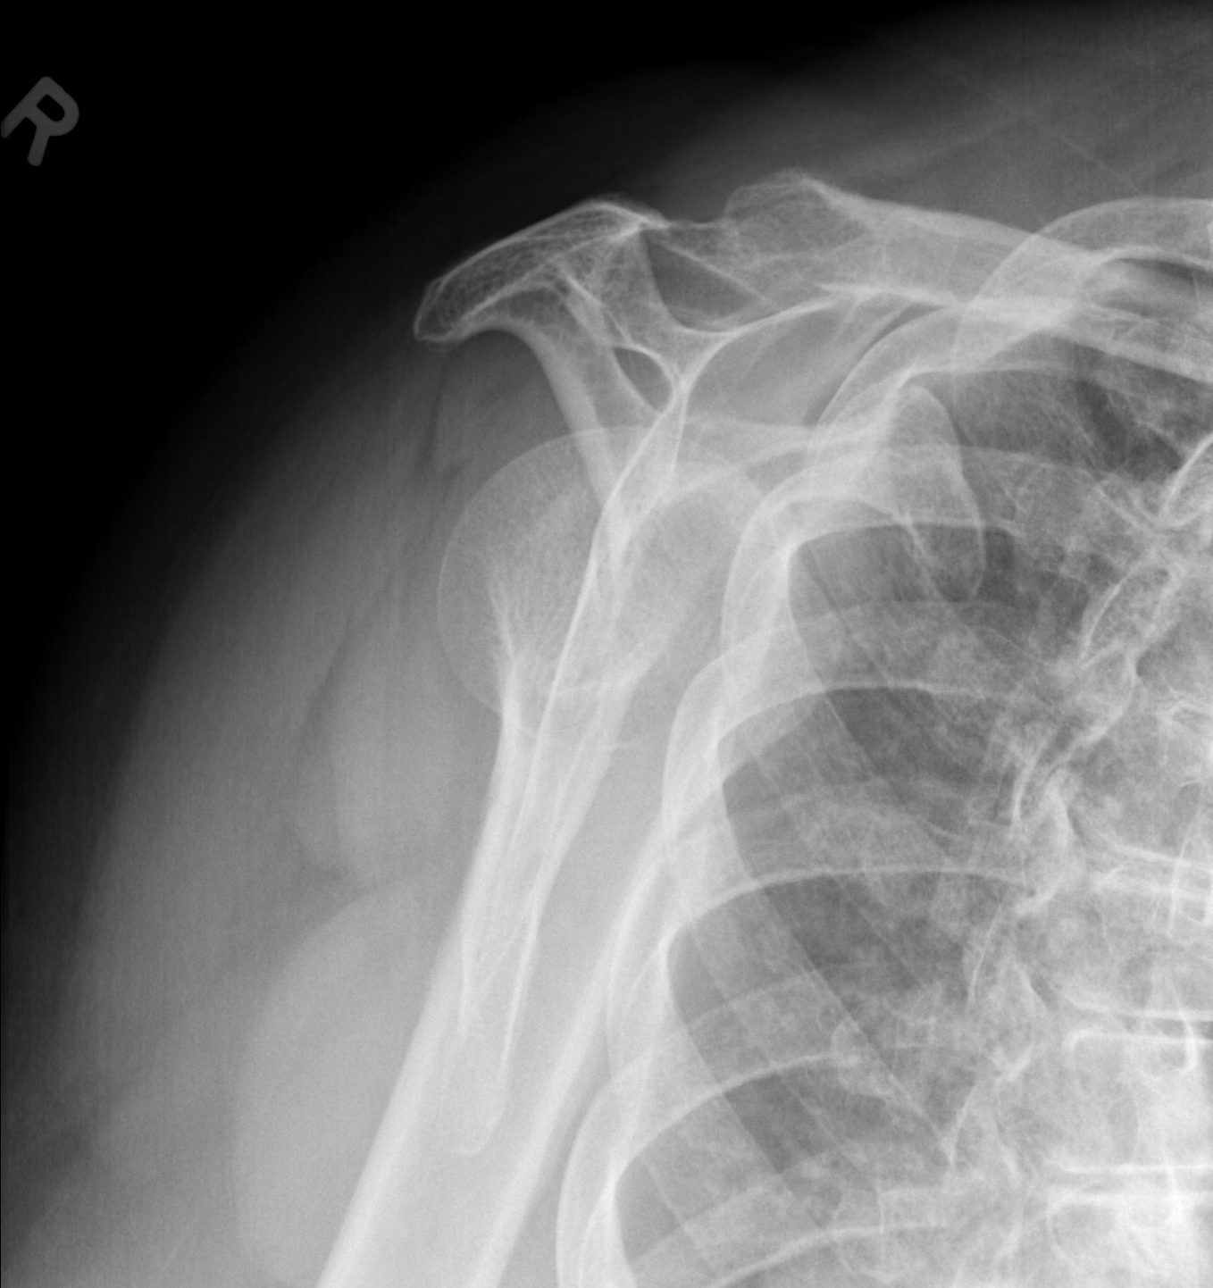

[4 of 4 positions shown; findings below may reference images not displayed]

FINDINGS: Mild downsloping acromion may predispose to rotator cuff
tendinopathy in the appropriate clinical context. No fracture or
dislocation.  No soft tissue abnormality.  No radiopaque foreign
body.
IMPRESSION: No acute finding.

## 2010-04-27 ENCOUNTER — Ambulatory Visit (HOSPITAL_COMMUNITY): Admission: RE | Admit: 2010-04-27 | Discharge: 2010-04-27 | Payer: Self-pay | Admitting: Family Medicine

## 2010-04-27 IMAGING — MG MM DIGITAL SCREENING
8 of 12 series · 8 of 12 positions shown · non-contrast
Comparison: none

DG SCREEN MAMMOGRAM BILATERAL
Bilateral CC and MLO view(s) were taken.
Technologist: OUDA.(OUDA)(M)

DIGITAL SCREENING MAMMOGRAM WITH CAD:
The breast tissue is almost entirely fatty.  No masses or malignant type calcifications are 
identified.  Compared with prior studies.
Images were processed with CAD.

[R CC (1 of 2)]
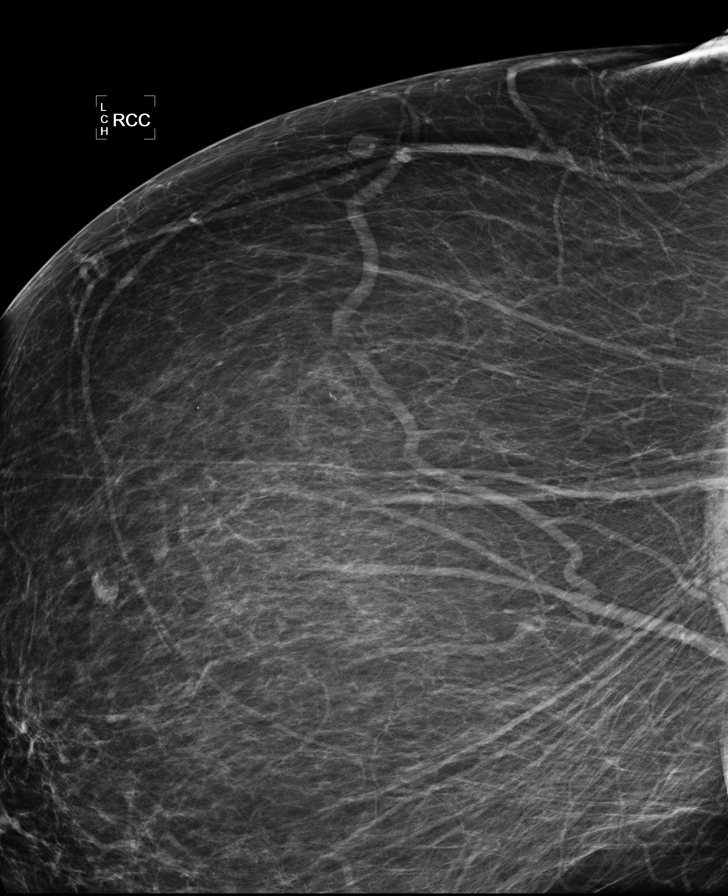

[R MLO (1 of 2)]
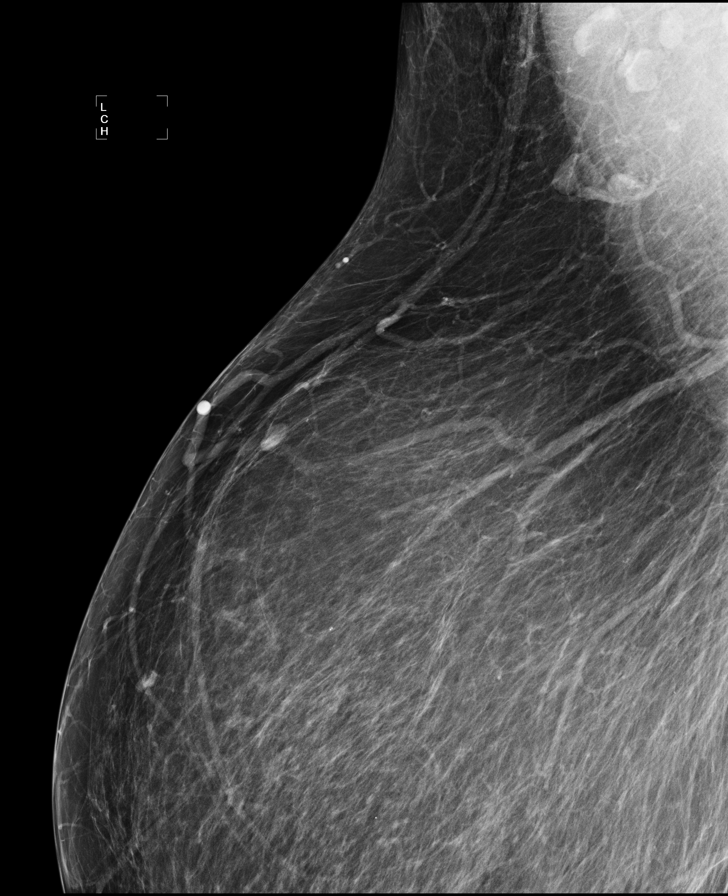

[L CC (1 of 2)]
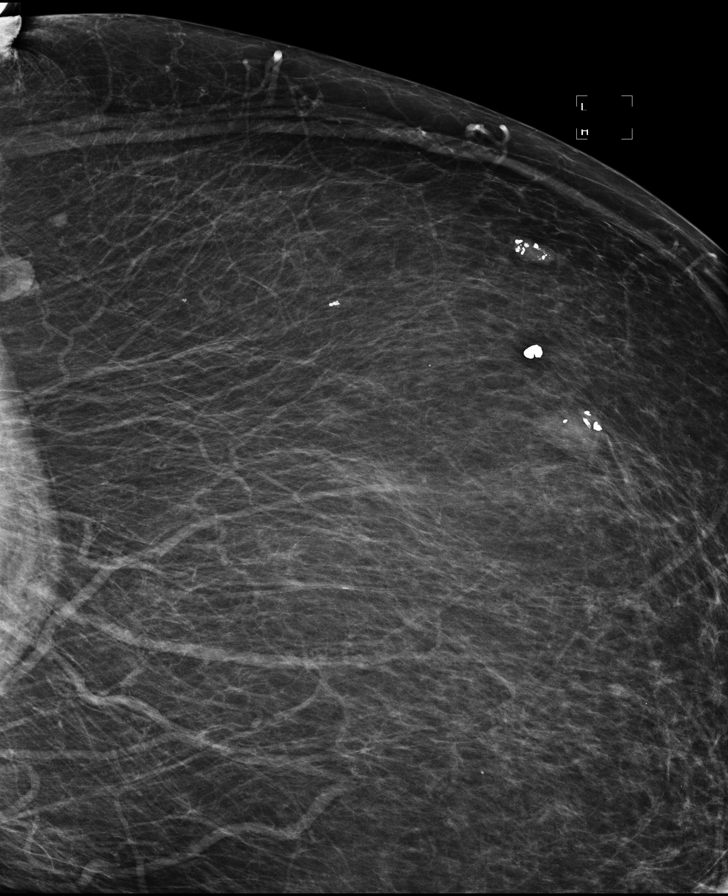

[L MLO (1 of 2)]
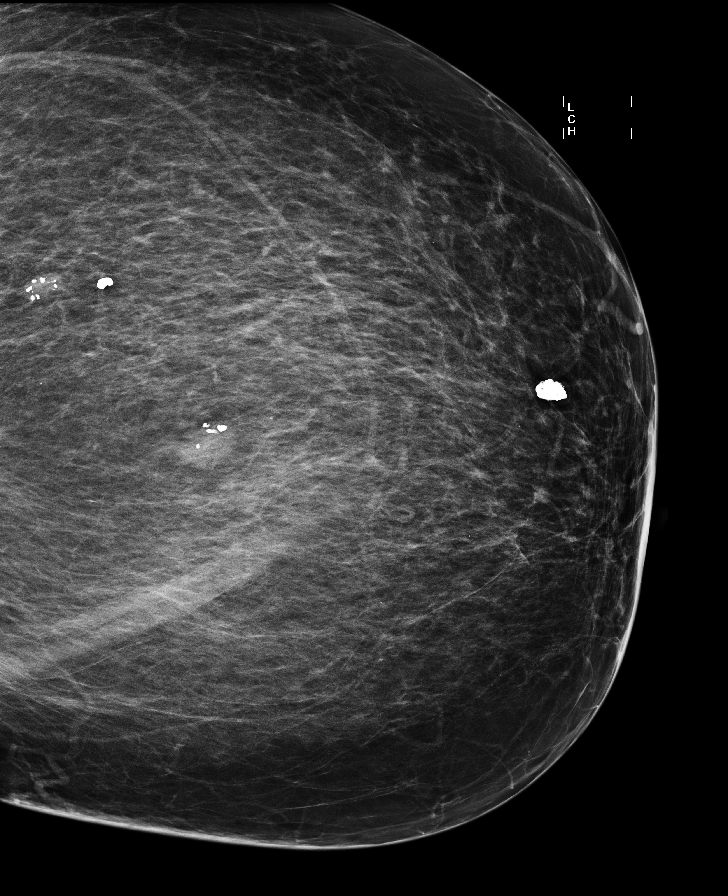

[L CC (2 of 2)]
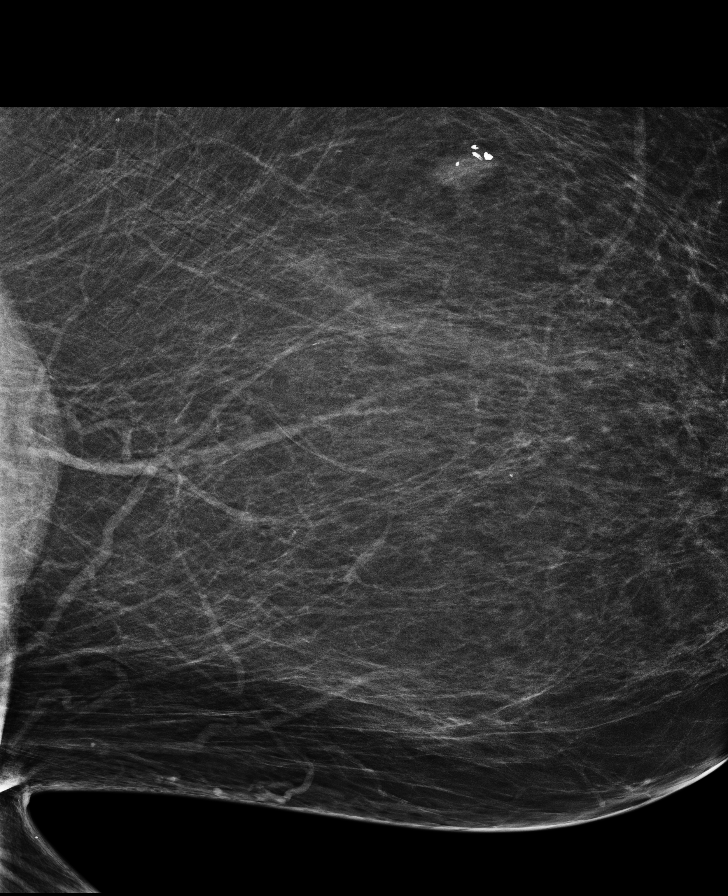

[R CC (2 of 2)]
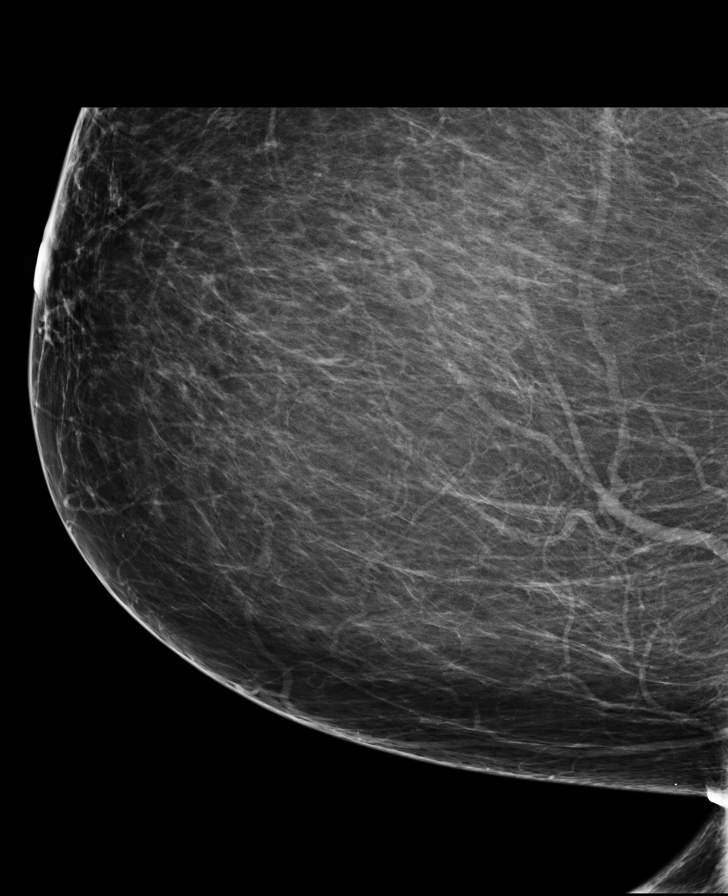

[L MLO (2 of 2)]
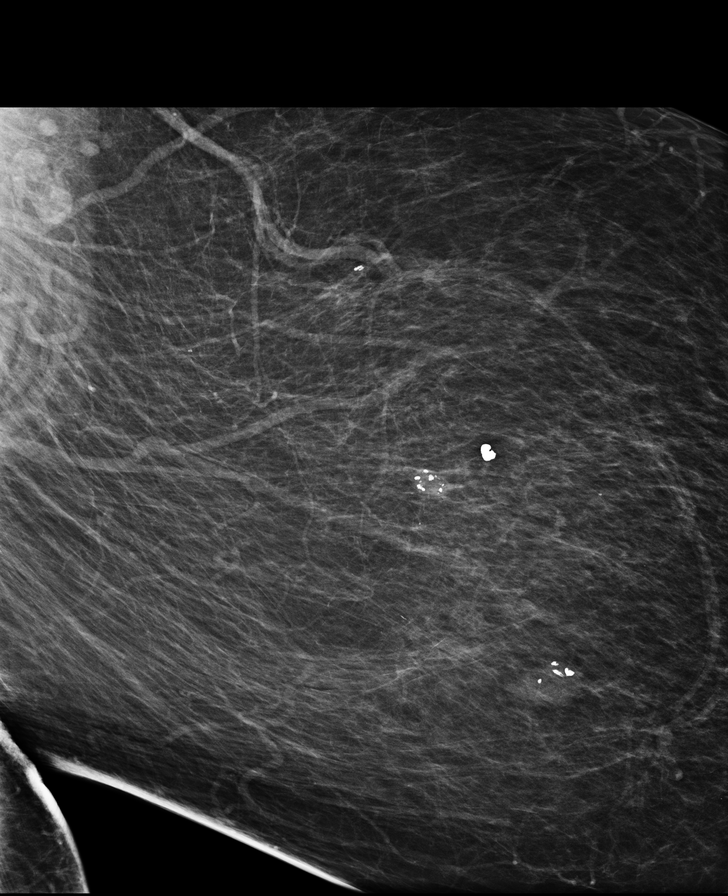

[R MLO (2 of 2)]
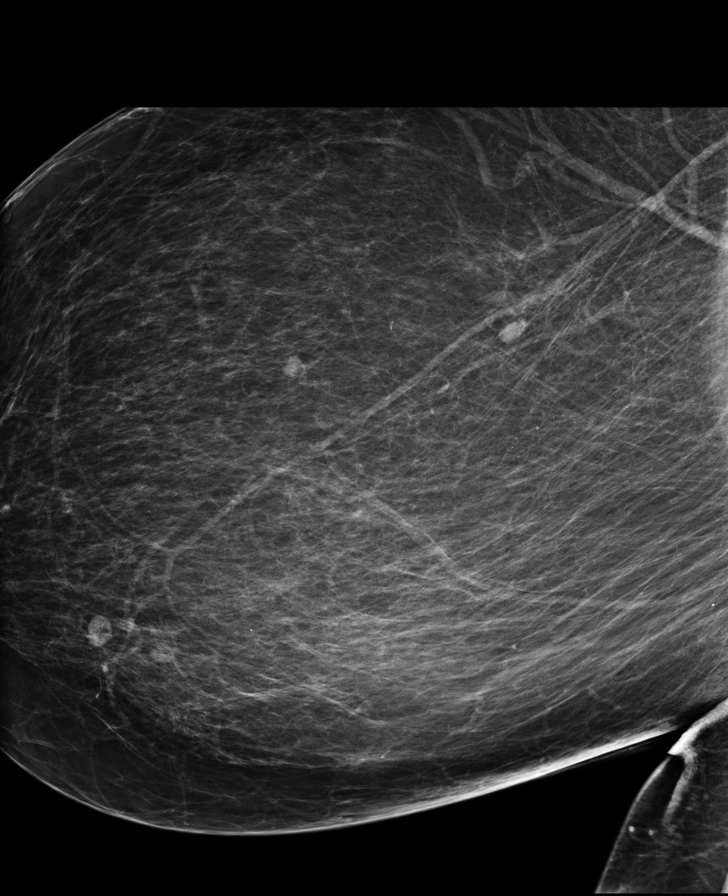

[8 of 12 positions shown; findings below may reference images not displayed]

IMPRESSION: No specific mammographic evidence of malignancy.  Next screening mammogram is recommended in one 
year.

A result letter of this screening mammogram will be mailed directly to the patient.

ASSESSMENT: Negative - BI-RADS 1

Screening mammogram in 1 year.
,

## 2010-10-01 ENCOUNTER — Encounter: Payer: Self-pay | Admitting: Family Medicine

## 2010-10-12 NOTE — Progress Notes (Signed)
Summary: triage/painful leg  Phone Note Call from Patient   Reason for Call: Talk to Nurse Summary of Call: Patient called stating he has been having problems with his feet and legs hurting for a long time...Marland Kitchenhowever his left leg has been hurting all weekend and the pain is different than what he had been experiencing in the past..Marland KitchenHe describes the pain as throbbing.Marland KitchenMarland KitchenHe denies back pain at this time...his chart shows a hx of lower back problems..He denies swelling or redness in the leg...he also denies any travel or immobilization for any lengthy periods.Marland KitchenMarland KitchenAppointment made in acutle slot. Advised patient should his leg become numb and is not relieved  before appointment in am he should go to Urgent Care.. Initial call taken by: Conchita Paris,  October 03, 2009 3:44 PM

## 2010-12-14 ENCOUNTER — Inpatient Hospital Stay (INDEPENDENT_AMBULATORY_CARE_PROVIDER_SITE_OTHER)
Admission: RE | Admit: 2010-12-14 | Discharge: 2010-12-14 | Disposition: A | Discharge status: Home / Self Care | Payer: Self-pay | Source: Ambulatory Visit | Attending: Family Medicine | Admitting: Family Medicine

## 2010-12-14 DIAGNOSIS — T7840XA Allergy, unspecified, initial encounter: Secondary | ICD-10-CM

## 2010-12-21 LAB — DIFFERENTIAL
Eosinophils Absolute: 0.2 10*3/uL (ref 0.0–0.7)
Eosinophils Relative: 4 % (ref 0–5)
Lymphocytes Relative: 44 % (ref 12–46)
Lymphs Abs: 2.4 10*3/uL (ref 0.7–4.0)
Monocytes Relative: 9 % (ref 3–12)
Neutrophils Relative %: 42 % — ABNORMAL LOW (ref 43–77)

## 2010-12-21 LAB — CBC
HCT: 34.6 % — ABNORMAL LOW (ref 36.0–46.0)
MCV: 96.2 fL (ref 78.0–100.0)
RBC: 3.6 MIL/uL — ABNORMAL LOW (ref 3.87–5.11)
WBC: 5.5 10*3/uL (ref 4.0–10.5)

## 2010-12-21 LAB — BASIC METABOLIC PANEL
Chloride: 100 mEq/L (ref 96–112)
GFR calc Af Amer: >60 mL/min (ref >60)
GFR calc non Af Amer: >60 mL/min (ref >60)
Potassium: 3.1 mEq/L — ABNORMAL LOW (ref 3.5–5.1)

## 2011-01-26 NOTE — Discharge Summary (Signed)
NAME:  Sheila Jacobs, Sheila Jacobs                       ACCOUNT NO.:  [id number]   MEDICAL RECORD NO.:  [id number]                   PATIENT TYPE:  INP   LOCATION:  0375                                 FACILITY:  [facility]   PHYSICIAN:  Deirdre Peer. Polite, M.D.              DATE OF BIRTH:  07-Sep-1951   DATE OF ADMISSION:  05/25/2003  DATE OF DISCHARGE:  05/27/2003                                 DISCHARGE SUMMARY   DISCHARGE DIAGNOSES:  1. Chest pain, atypical, no indication of cardiac ischemia.  2. Hypertension, controlled.  3. Congestive heart failure secondary to #2, resolved.   DISCHARGE MEDICATIONS:  1. Aspirin 81 mg a day.  2. Lasix 40 mg a day.  3. K-Dur 20 mEq daily.   The patient is instructed not to take any blood pressure medications until  she follows up with Dr. Foy Guadalajara.   ALLERGIES:  No known drug allergies.   PROCEDURES:  None.   HISTORY OF PRESENT ILLNESS:  This is a 60 year old black female with a  history of hypertension and morbid obesity who comes to the emergency room  complaining of intermittent chest pain x1 week which is substernal, 8/10,  lasting a few seconds with each episode.  This pain occurred at rest and she  has never complained of pain with exertion;  however, she does have dyspnea  on exertion, paroxysmal nocturnal dyspnea, and pedal edema.  She is a  smoker.  She has no history of coronary artery disease.  She is admitted to  rule out MI.   HOSPITAL COURSE:  The patient is admitted to rule out MI.  Three sets of  cardiac enzymes were performed and found to be negative.  D-dimer was mildly  positive at 0.56.  EKG showed sinus rhythm with first degree heart block, no  acute ST or T-wave changes, no significant ectopy.  The patient's telemetry  monitoring showed the same throughout her stay.  She did not have any  further complaints of chest pain after admission.  She was treated with IV  diuretics and kept on Lovenox for DVT prophylaxis.  Chest x-ray  revealed  mild pulmonary edema.  The patient's respiratory status was greatly improved  after gentle IV diuresis.   Dr. Mayford Knife of cardiology was consulted regarding the patient's stress test.  She found that the stress test to be negative for any type of ischemia.  The  patient's EF has been preserved at 55 to 65%.  There are no wall motion  abnormalities and no indication of ischemia.  The patient is therefore  cleared for discharge.   The patient was noted to be mildly hypotensive after her diuresis and she  was initially treated on ACE inhibitor as well during this admission, but  given the fact that she has a preserved EF we will not discharge her on  that.  Blood pressure at the time of discharge is 85/55, but the  patient is  completely asymptomatic.  She is instructed not to take any blood pressure  medications upon discharge, only a low dose diuretic and her potassium  replacement.  She is to follow up with her primary care M.D. some time in  the next seven days.  There is no need for followup with cardiology at this  time.  The patient is encouraged to lose weight and begin mild aerobic  activity.   LABORATORY DATA:  Discharge labs:  Sodium 129, potassium 3.4, glucose 106,  BUN 13, creatinine 1.  BNP less then 30.  PT 12.9, INR 1, PTT 30.  White  blood cell count 4.4, hemoglobin 12.4, hematocrit 36.7, platelet count  240,000.  Lipase is 21.   CONSULTATIONS:  Dr. Armanda Magic of [facility].   CONDITION ON DISCHARGE:  Good.   DISPOSITION:  Discharged to home.   FOLLOWUP:  The patient is instructed to follow up with Dr. Foy Guadalajara in the next  seven days.     Ellender Hose. Virl Son. Polite, M.D.    SMD/MEDQ  D:  05/27/2003  T:  05/27/2003  Job:  [id number]   cc:   Armanda Magic, M.D.  [address]  [place], [place] [address]  Fax: [phone]   Doris Cheadle. Foy Guadalajara, M.D.  [address] [place]  [place] [address]  Fax:  [phone]

## 2011-04-16 ENCOUNTER — Other Ambulatory Visit (HOSPITAL_COMMUNITY): Payer: Self-pay | Admitting: Family Medicine

## 2011-04-16 DIAGNOSIS — Z1231 Encounter for screening mammogram for malignant neoplasm of breast: Secondary | ICD-10-CM

## 2011-04-17 ENCOUNTER — Other Ambulatory Visit: Payer: Self-pay | Admitting: Family Medicine

## 2011-05-01 ENCOUNTER — Ambulatory Visit (HOSPITAL_COMMUNITY)
Admission: RE | Admit: 2011-05-01 | Discharge: 2011-05-01 | Disposition: A | Discharge status: Home / Self Care | Payer: Self-pay | Source: Ambulatory Visit | Attending: Family Medicine | Admitting: Family Medicine

## 2011-05-01 DIAGNOSIS — Z1231 Encounter for screening mammogram for malignant neoplasm of breast: Secondary | ICD-10-CM | POA: Insufficient documentation

## 2011-05-04 ENCOUNTER — Other Ambulatory Visit: Payer: Self-pay | Admitting: Family Medicine

## 2011-05-04 DIAGNOSIS — R928 Other abnormal and inconclusive findings on diagnostic imaging of breast: Secondary | ICD-10-CM

## 2011-06-18 LAB — DIFFERENTIAL
Basophils Absolute: 0
Basophils Relative: 1
Eosinophils Absolute: 0.1 — ABNORMAL LOW
Eosinophils Relative: 3
Monocytes Absolute: 0.4
Monocytes Relative: 9
Neutro Abs: 1.8

## 2011-06-18 LAB — BASIC METABOLIC PANEL
CO2: 28
Calcium: 9.6
Chloride: 98
GFR calc Af Amer: >60
Glucose, Bld: 101 — ABNORMAL HIGH
Sodium: 133 — ABNORMAL LOW

## 2011-06-18 LAB — POCT CARDIAC MARKERS
CKMB, poc: 1.4
CKMB, poc: 2.3
Myoglobin, poc: 126
Troponin i, poc: <0.05

## 2011-06-18 LAB — CBC
Hemoglobin: 12.3
MCHC: 34.7
MCV: 93.6
RBC: 3.79 — ABNORMAL LOW
RDW: 12

## 2011-08-07 ENCOUNTER — Ambulatory Visit
Admission: RE | Admit: 2011-08-07 | Discharge: 2011-08-07 | Disposition: A | Discharge status: Home / Self Care | Payer: No Typology Code available for payment source | Source: Ambulatory Visit | Attending: Family Medicine | Admitting: Family Medicine

## 2011-08-07 DIAGNOSIS — R928 Other abnormal and inconclusive findings on diagnostic imaging of breast: Secondary | ICD-10-CM

## 2011-11-05 ENCOUNTER — Encounter (HOSPITAL_COMMUNITY): Payer: Self-pay

## 2011-11-05 ENCOUNTER — Other Ambulatory Visit: Payer: Self-pay

## 2011-11-05 ENCOUNTER — Emergency Department (HOSPITAL_COMMUNITY)
Admission: EM | Admit: 2011-11-05 | Discharge: 2011-11-05 | Disposition: A | Discharge status: Home Health | Payer: Self-pay | Source: Home / Self Care | Attending: Family Medicine | Admitting: Family Medicine

## 2011-11-05 DIAGNOSIS — R0789 Other chest pain: Secondary | ICD-10-CM

## 2011-11-05 HISTORY — DX: Essential (primary) hypertension: I10

## 2011-11-05 MED ORDER — TRAMADOL HCL 50 MG PO TABS: 50.0000 mg | ORAL_TABLET | Freq: Four times a day (QID) | ORAL | Status: AC | PRN

## 2011-11-05 NOTE — ED Provider Notes (Signed)
History     CSN: [id number]  Arrival date & time 11/05/11  1557   First MD Initiated Contact with Patient 11/05/11 1608      Chief Complaint  Patient presents with  . Chest Pain    (Consider location/radiation/quality/duration/timing/severity/associated sxs/prior treatment) Patient is a 61 y.o. female presenting with chest pain. The history is provided by the patient.  Chest Pain The chest pain began 5 - 7 days ago (chronic recurrent soreness., smoker., 1 ppd.). Chest pain occurs intermittently. The chest pain is unchanged. The severity of the pain is mild. The quality of the pain is described as sharp and similar to previous episodes. The pain does not radiate. Pertinent negatives for primary symptoms include no shortness of breath, no cough and no palpitations.     Past Medical History  Diagnosis Date  . Hypertension     Past Surgical History  Procedure Date  . Tubal ligation     No family history on file.  History  Substance Use Topics  . Smoking status: Current Everyday Smoker -- 1.0 packs/day  . Smokeless tobacco: Not on file  . Alcohol Use: No    OB History    Grav Para Term Preterm Abortions TAB SAB Ect Mult Living                  Review of Systems  Constitutional: Negative.   HENT: Negative.   Respiratory: Negative for cough and shortness of breath.   Cardiovascular: Positive for chest pain. Negative for palpitations.  Gastrointestinal: Negative.     Allergies  Review of patient's allergies indicates no known allergies.  Home Medications   Current Outpatient Rx  Name Route Sig Dispense Refill  . ASPIRIN 81 MG PO TABS Oral Take 81 mg by mouth daily.    Marland Kitchen VITAMIN D 1000 UNITS PO TABS Oral Take 1,000 Units by mouth daily.    Marland Kitchen HYDROCHLOROTHIAZIDE 25 MG PO TABS Oral Take 25 mg by mouth daily.    . TRAMADOL HCL 50 MG PO TABS Oral Take 1 tablet (50 mg total) by mouth every 6 (six) hours as needed for pain. 20 tablet 0    BP 126/79  Pulse 68   Temp(Src) 97.7 F (36.5 C) (Oral)  Resp 20  SpO2 100%  Physical Exam  Nursing note and vitals reviewed. Constitutional: She appears well-developed and well-nourished.  Neck: Normal range of motion. Neck supple.  Cardiovascular: Normal rate, regular rhythm, normal heart sounds and intact distal pulses.   Pulmonary/Chest: Effort normal and breath sounds normal. She exhibits tenderness.    Lymphadenopathy:    She has no cervical adenopathy.    ED Course  Procedures (including critical care time)  Labs Reviewed - No data to display Dg Finger Thumb Right  11/08/2011  *RADIOLOGY REPORT*  Clinical Data: Right thumb in a car door, laceration  RIGHT THUMB 2+V  Comparison: None  Findings: Question mild osseous demineralization. Joint spaces preserved. Soft tissue swelling right thumb. No acute fracture, dislocation or bone destruction.  IMPRESSION: No acute bony abnormalities.  Original Report Authenticated By: Lollie Marrow, M.D.     1. Chest pain, musculoskeletal       MDM  ecg-- no acute change.        Barkley Bruns, MD 11/09/11 [phone]

## 2011-11-05 NOTE — ED Notes (Signed)
Pt c/o lt side chest "soreness" and "throbbing" intermittently for 1 week.  Denies SOB, diaphoresis, n/v or radiation of pain.  Reports she has these pains all the time and has been evaluated in the ED for same previously.  States they tell her to quit smoking.

## 2011-11-08 ENCOUNTER — Emergency Department (HOSPITAL_COMMUNITY): Payer: Self-pay

## 2011-11-08 ENCOUNTER — Emergency Department (HOSPITAL_COMMUNITY)
Admission: EM | Admit: 2011-11-08 | Discharge: 2011-11-08 | Disposition: A | Discharge status: Home Health | Payer: Self-pay | Attending: Emergency Medicine | Admitting: Emergency Medicine

## 2011-11-08 ENCOUNTER — Encounter (HOSPITAL_COMMUNITY): Payer: Self-pay | Admitting: *Deleted

## 2011-11-08 DIAGNOSIS — S61219A Laceration without foreign body of unspecified finger without damage to nail, initial encounter: Secondary | ICD-10-CM

## 2011-11-08 DIAGNOSIS — W230XXA Caught, crushed, jammed, or pinched between moving objects, initial encounter: Secondary | ICD-10-CM | POA: Insufficient documentation

## 2011-11-08 DIAGNOSIS — I10 Essential (primary) hypertension: Secondary | ICD-10-CM | POA: Insufficient documentation

## 2011-11-08 DIAGNOSIS — S61209A Unspecified open wound of unspecified finger without damage to nail, initial encounter: Secondary | ICD-10-CM | POA: Insufficient documentation

## 2011-11-08 IMAGING — CR DG FINGER THUMB 2+V*R*
3 series · 3 of 3 positions shown · non-contrast
Comparison: None

CLINICAL DATA: Right thumb in a car door, laceration

RIGHT THUMB 2+V

[x finger pa right]
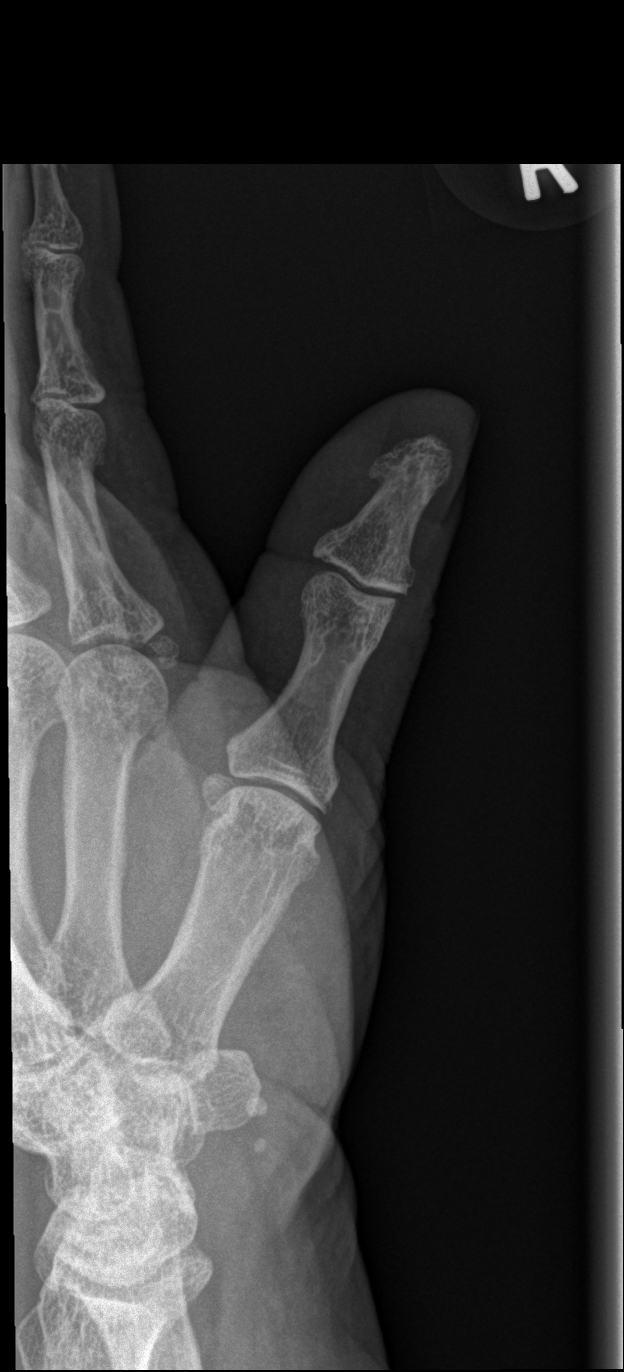

[x finger obl right]
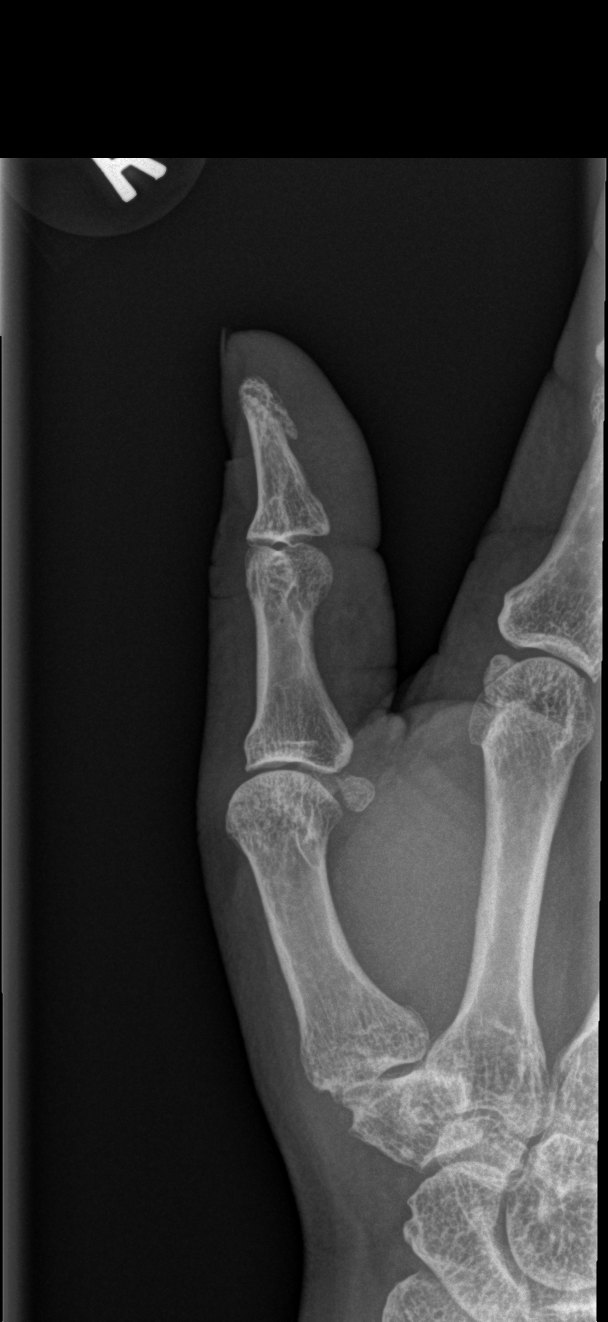

[x finger lat right]
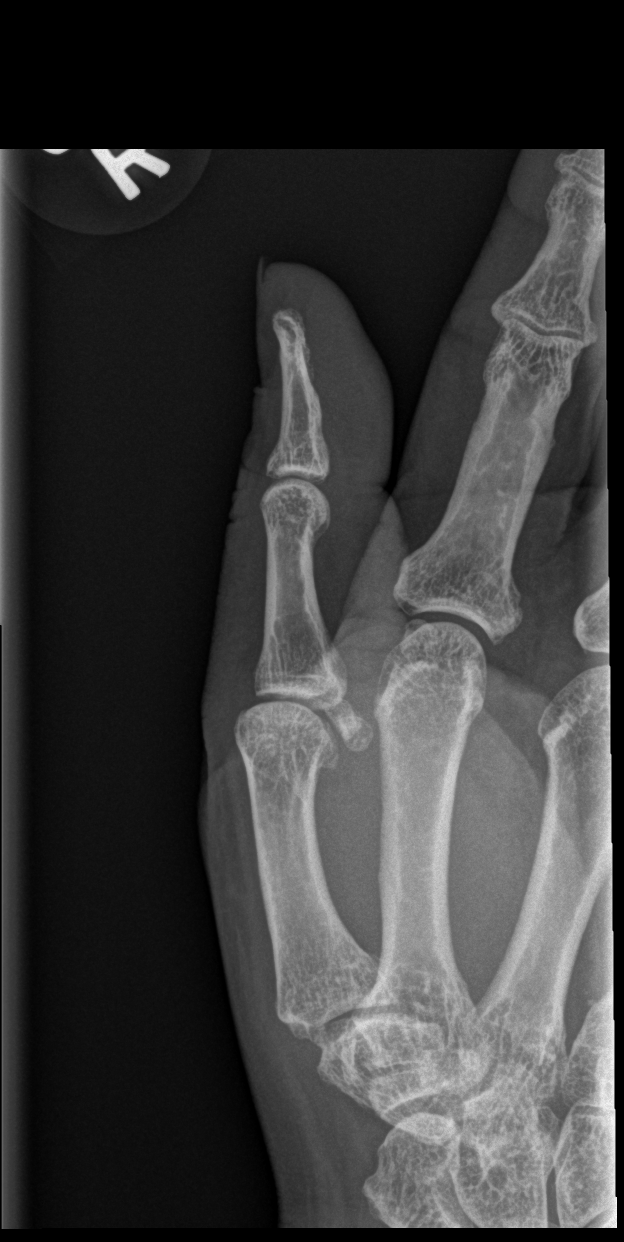

[3 of 3 positions shown; findings below may reference images not displayed]

FINDINGS: Question mild osseous demineralization.
Joint spaces preserved.
Soft tissue swelling right thumb.
No acute fracture, dislocation or bone destruction.
IMPRESSION: No acute bony abnormalities.

## 2011-11-08 MED ORDER — TETANUS-DIPHTH-ACELL PERTUSSIS 5-2.5-18.5 LF-MCG/0.5 IM SUSP
0.5000 mL | Freq: Once | INTRAMUSCULAR | Status: AC
  Administered 2011-11-08: 0.5 mL via INTRAMUSCULAR
  Filled 2011-11-08: qty 0.5

## 2011-11-08 MED ORDER — LIDOCAINE HCL 1 % IJ SOLN
INTRAMUSCULAR | Status: AC
  Administered 2011-11-08: 18:00:00
  Filled 2011-11-08: qty 20

## 2011-11-08 NOTE — Discharge Instructions (Signed)
Return in 7 days to have stitches removed.

## 2011-11-08 NOTE — ED Notes (Signed)
Pt states "I had my hand in the door on the van, my grandson didn't see it & shut the door on it"; pt presents with lac to right hand, 1st digit, bleeding controlled @ present.

## 2011-11-08 NOTE — ED Provider Notes (Signed)
History     CSN: [id number]  Arrival date & time 11/08/11  1500   First MD Initiated Contact with Patient 11/08/11 1615      Chief Complaint  Patient presents with  . Laceration    (Consider location/radiation/quality/duration/timing/severity/associated sxs/prior treatment) HPI Comments: Patient reports that her grandson unintentionally shut her finger in the door to her Zenaida Niece.  Patient is a 61 y.o. female presenting with skin laceration. The history is provided by the patient.  Laceration  The incident occurred less than 1 hour ago. Pain location: right first digit, palmar surface. Size: 1.5 cm. Injury mechanism: door. The pain is moderate. The pain has been constant since onset. She reports no foreign bodies present. Her tetanus status is out of date.    Past Medical History  Diagnosis Date  . Hypertension     Past Surgical History  Procedure Date  . Tubal ligation     No family history on file.  History  Substance Use Topics  . Smoking status: Current Everyday Smoker -- 1.0 packs/day  . Smokeless tobacco: Not on file  . Alcohol Use: No    OB History    Grav Para Term Preterm Abortions TAB SAB Ect Mult Living                  Review of Systems  Constitutional: Negative for fever and chills.  Gastrointestinal: Negative for nausea and vomiting.  Skin: Positive for wound.  Neurological: Negative for dizziness, weakness and numbness.    Allergies  Review of patient's allergies indicates no known allergies.  Home Medications   Current Outpatient Rx  Name Route Sig Dispense Refill  . ASPIRIN 81 MG PO TABS Oral Take 81 mg by mouth daily.    Marland Kitchen VITAMIN D 1000 UNITS PO TABS Oral Take 1,000 Units by mouth daily.    Marland Kitchen HYDROCHLOROTHIAZIDE 25 MG PO TABS Oral Take 25 mg by mouth daily.    . TRAMADOL HCL 50 MG PO TABS Oral Take 1 tablet (50 mg total) by mouth every 6 (six) hours as needed for pain. 20 tablet 0    BP 125/66  Pulse 75  Temp 97.9 F (36.6 C)  Resp  22  Wt 287 lb (130.182 kg)  SpO2 97%  Physical Exam  Nursing note and vitals reviewed. Constitutional: She is oriented to person, place, and time. She appears well-developed and well-nourished.  HENT:  Head: Normocephalic and atraumatic.  Neck: Normal range of motion. Neck supple.  Cardiovascular: Normal rate, regular rhythm and normal heart sounds.   Pulmonary/Chest: Effort normal and breath sounds normal.  Musculoskeletal:       Approximately 1.5 cm laceration on the palmar surface of the right first digit.  Patient able to move DIP and MCP without difficulty.  Does not appear to have any tendon involvement.  Distal sensation intact.  Radial pulse 2+.  Neurological: She is alert and oriented to person, place, and time.  Skin: Skin is warm and dry.  Psychiatric: She has a normal mood and affect.    ED Course  Procedures (including critical care time)  Labs Reviewed - No data to display No results found.   No diagnosis found.  LACERATION REPAIR Performed by: Anne Shutter, Demetrius Barrell Authorized by: Anne Shutter, Herbert Seta Consent: Verbal consent obtained. Risks and benefits: risks, benefits and alternatives were discussed Consent given by: patient Patient identity confirmed: provided demographic data Prepped and Draped in normal sterile fashion Wound explored  Laceration Location: palmar surface of right  first digit  Laceration Length: 1.5 cm  No Foreign Bodies seen or palpated  Anesthesia: local infiltration  Local anesthetic: lidocaine 1% without epinephrine  Anesthetic total: 3 ml  Irrigation method: syringe Amount of cleaning: standard  Skin closure: 3-0 prolene  Number of sutures: 2  Technique: simple interrupted.  Patient tolerance: Patient tolerated the procedure well with no immediate complications.  MDM  Patient with laceration closed in the ED.  Xrays negative.  Neurovascularly intact.  Patient instructed to follow up in 7 days for suture removal.   Patient given tetanus in ED.        Pascal Lux [place], PA-C 11/09/11 0226  Pascal Lux [place], PA-C 11/09/11 [phone]

## 2011-11-08 NOTE — ED Notes (Signed)
Heather, PA at bedside for sutures.

## 2011-11-10 NOTE — ED Provider Notes (Signed)
Medical screening examination/treatment/procedure(s) were performed by non-physician practitioner and as supervising physician I was immediately available for consultation/collaboration.  Correy Weidner, MD 11/10/11 0124 

## 2012-04-18 ENCOUNTER — Emergency Department (HOSPITAL_COMMUNITY)
Admission: EM | Admit: 2012-04-18 | Discharge: 2012-04-18 | Disposition: A | Discharge status: Home / Self Care | Payer: Self-pay | Attending: Emergency Medicine | Admitting: Emergency Medicine

## 2012-04-18 ENCOUNTER — Encounter (HOSPITAL_COMMUNITY): Payer: Self-pay | Admitting: Emergency Medicine

## 2012-04-18 DIAGNOSIS — Z79899 Other long term (current) drug therapy: Secondary | ICD-10-CM | POA: Insufficient documentation

## 2012-04-18 DIAGNOSIS — F17200 Nicotine dependence, unspecified, uncomplicated: Secondary | ICD-10-CM | POA: Insufficient documentation

## 2012-04-18 DIAGNOSIS — R202 Paresthesia of skin: Secondary | ICD-10-CM

## 2012-04-18 DIAGNOSIS — I10 Essential (primary) hypertension: Secondary | ICD-10-CM | POA: Insufficient documentation

## 2012-04-18 DIAGNOSIS — R209 Unspecified disturbances of skin sensation: Secondary | ICD-10-CM | POA: Insufficient documentation

## 2012-04-18 LAB — CBC WITH DIFFERENTIAL/PLATELET
Basophils Relative: 1 % (ref 0–1)
Eosinophils Absolute: 0.2 10*3/uL (ref 0.0–0.7)
Hemoglobin: 12.7 g/dL (ref 12.0–15.0)
Lymphs Abs: 1.8 10*3/uL (ref 0.7–4.0)
MCH: 32.6 pg (ref 26.0–34.0)
Monocytes Relative: 8 % (ref 3–12)
Neutro Abs: 2.5 10*3/uL (ref 1.7–7.7)
Neutrophils Relative %: 51 % (ref 43–77)
Platelets: 235 10*3/uL (ref 150–400)
RBC: 3.89 MIL/uL (ref 3.87–5.11)

## 2012-04-18 LAB — COMPREHENSIVE METABOLIC PANEL
ALT: 9 U/L (ref 0–35)
Albumin: 3.5 g/dL (ref 3.5–5.2)
Alkaline Phosphatase: 65 U/L (ref 39–117)
Chloride: 98 mEq/L (ref 96–112)
Glucose, Bld: 103 mg/dL — ABNORMAL HIGH (ref 70–99)
Potassium: 3.2 mEq/L — ABNORMAL LOW (ref 3.5–5.1)
Sodium: 134 mEq/L — ABNORMAL LOW (ref 135–145)
Total Bilirubin: 0.3 mg/dL (ref 0.3–1.2)
Total Protein: 7.5 g/dL (ref 6.0–8.3)

## 2012-04-18 MED ORDER — ACETAMINOPHEN 325 MG PO TABS
650.0000 mg | ORAL_TABLET | Freq: Once | ORAL | Status: AC
  Administered 2012-04-18: 650 mg via ORAL
  Filled 2012-04-18: qty 2

## 2012-04-18 NOTE — ED Notes (Signed)
Pt presenting to ed with c/o left arm numbness onset today pt states pain is intermittent. Pt states positive swelling to left arm also. Pt denies chest pain, nausea and vomiting. Pt with no neuro deficits noted at this time, no facial droop or slurred speech noted

## 2012-04-18 NOTE — ED Provider Notes (Addendum)
History     CSN: [id number]  Arrival date & time 04/18/12  1831   First MD Initiated Contact with Patient 04/18/12 2107      Chief Complaint  Patient presents with  . Extremity Weakness    (Consider location/radiation/quality/duration/timing/severity/associated sxs/prior treatment) Patient is a 61 y.o. female presenting with extremity weakness. The history is provided by the patient.  Extremity Weakness Pertinent negatives include no chest pain, no abdominal pain, no headaches and no shortness of breath.   she has a history of hypertension, and she smokes.  She complains of numbness in her left arm.  That was intermittent for a few hours earlier today.  She states that it resolved, and then, she leaned on her left elbow, and she had numbness in her palm and index finger.  Again.  Presently, she is asymptomatic.  She denied any weakness or pain at all.  She denied fluttering in her chest.  She did not have chest pain.  She denies shortness of breath.  She denies lightheadedness.  Past Medical History  Diagnosis Date  . Hypertension     Past Surgical History  Procedure Date  . Tubal ligation     No family history on file.  History  Substance Use Topics  . Smoking status: Current Everyday Smoker -- 1.0 packs/day    Types: Cigarettes  . Smokeless tobacco: Not on file  . Alcohol Use: No    OB History    Grav Para Term Preterm Abortions TAB SAB Ect Mult Living                  Review of Systems  Constitutional: Negative for chills.  HENT: Negative for neck pain.   Eyes: Negative for visual disturbance.  Respiratory: Negative for cough, chest tightness and shortness of breath.   Cardiovascular: Negative for chest pain, palpitations and leg swelling.  Gastrointestinal: Negative for nausea, vomiting and abdominal pain.  Genitourinary: Negative for dysuria.  Musculoskeletal: Positive for extremity weakness. Negative for back pain.  Skin: Negative for rash.  Neurological:  Positive for numbness. Negative for weakness and headaches.  Psychiatric/Behavioral: Negative for confusion.  All other systems reviewed and are negative.    Allergies  Review of patient's allergies indicates no known allergies.  Home Medications   Current Outpatient Rx  Name Route Sig Dispense Refill  . ASPIRIN 81 MG PO TABS Oral Take 81 mg by mouth daily.    Marland Kitchen CALCIUM CITRATE-VITAMIN D 315-200 MG-UNIT PO TABS Oral Take 1 tablet by mouth daily.    Marland Kitchen HYDROCHLOROTHIAZIDE 25 MG PO TABS Oral Take 25 mg by mouth daily.      BP 137/79  Pulse 63  Temp 98.2 F (36.8 C) (Oral)  Resp 18  SpO2 95%  Physical Exam  Nursing note and vitals reviewed. Constitutional: She is oriented to person, place, and time. No distress.       Morbidly obese  HENT:  Head: Normocephalic and atraumatic.  Eyes: Conjunctivae are normal.  Neck: Normal range of motion. Neck supple. No JVD present.       No bruits  Cardiovascular: Normal rate and intact distal pulses.   No murmur heard. Pulmonary/Chest: Effort normal and breath sounds normal.  Abdominal: Soft. Bowel sounds are normal.  Musculoskeletal: Normal range of motion. She exhibits no edema.  Neurological: She is alert and oriented to person, place, and time. She has normal strength. No cranial nerve deficit or sensory deficit. She exhibits normal muscle tone. GCS eye subscore is  4. GCS verbal subscore is 5. GCS motor subscore is 6.    ED Course  Procedures (including critical care time) transient paresthesia in the left arm.  No neurological deficits.  No other symptoms.  There is no indication that she has a stroke or ACS.  Labs Reviewed  COMPREHENSIVE METABOLIC PANEL - Abnormal; Notable for the following:    Sodium 134 (*)     Potassium 3.2 (*)     Glucose, Bld 103 (*)     All other components within normal limits  CBC WITH DIFFERENTIAL   No results found.   1. Paresthesias    ecg nsr at 70 bpm Left axis deviation. First degree AV  block   MDM  Paresthesias, resolved.  No evidence of neurological deficit or significant illness        Cheri Guppy, MD 04/18/12 4098  Cheri Guppy, MD 06/28/12 2207

## 2012-04-24 ENCOUNTER — Other Ambulatory Visit: Payer: Self-pay | Admitting: Family Medicine

## 2012-04-24 DIAGNOSIS — Z1231 Encounter for screening mammogram for malignant neoplasm of breast: Secondary | ICD-10-CM

## 2012-04-29 ENCOUNTER — Encounter (HOSPITAL_COMMUNITY): Payer: Self-pay | Admitting: *Deleted

## 2012-04-29 ENCOUNTER — Emergency Department (HOSPITAL_COMMUNITY)
Admission: EM | Admit: 2012-04-29 | Discharge: 2012-04-29 | Disposition: A | Discharge status: Home / Self Care | Payer: Self-pay | Source: Home / Self Care | Attending: Family Medicine | Admitting: Family Medicine

## 2012-04-29 DIAGNOSIS — I10 Essential (primary) hypertension: Secondary | ICD-10-CM

## 2012-04-29 MED ORDER — HYDROCHLOROTHIAZIDE 25 MG PO TABS: 25.0000 mg | ORAL_TABLET | Freq: Every day | ORAL | Status: DC

## 2012-04-29 NOTE — ED Provider Notes (Signed)
History     CSN: [id number]  Arrival date & time 04/29/12  1101   First MD Initiated Contact with Patient 04/29/12 1107      Chief Complaint  Patient presents with  . Hypertension    (Consider location/radiation/quality/duration/timing/severity/associated sxs/prior treatment) Patient is a 61 y.o. female presenting with hypertension. The history is provided by the patient.  Hypertension This is a chronic problem. The current episode started yesterday (here as [facility] pt requesting bp med refill. no sx, recent ER visit sx resolved, lab eval wnl.). The problem has not changed since onset.Pertinent negatives include no chest pain and no abdominal pain.    Past Medical History  Diagnosis Date  . Hypertension     Past Surgical History  Procedure Date  . Tubal ligation     Family History  Problem Relation Age of Onset  . Hypertension Mother     History  Substance Use Topics  . Smoking status: Current Everyday Smoker -- 1.0 packs/day    Types: Cigarettes  . Smokeless tobacco: Not on file  . Alcohol Use: No    OB History    Grav Para Term Preterm Abortions TAB SAB Ect Mult Living                  Review of Systems  Constitutional: Negative.   Respiratory: Negative.   Cardiovascular: Negative.  Negative for chest pain.  Gastrointestinal: Negative.  Negative for abdominal pain.    Allergies  Review of patient's allergies indicates no known allergies.  Home Medications   Current Outpatient Rx  Name Route Sig Dispense Refill  . ASPIRIN 81 MG PO TABS Oral Take 81 mg by mouth daily.    Marland Kitchen HYDROCHLOROTHIAZIDE 25 MG PO TABS Oral Take 25 mg by mouth daily.    Marland Kitchen CALCIUM CITRATE-VITAMIN D 315-200 MG-UNIT PO TABS Oral Take 1 tablet by mouth daily.    Marland Kitchen HYDROCHLOROTHIAZIDE 25 MG PO TABS Oral Take 1 tablet (25 mg total) by mouth daily. 30 tablet 1    BP 101/60  Pulse 72  Temp 98 F (36.7 C) (Oral)  Resp 20  SpO2 97%  Physical Exam  Nursing note and vitals  reviewed. Constitutional: She is oriented to person, place, and time. She appears well-developed and well-nourished.  HENT:  Head: Normocephalic.  Eyes: Pupils are equal, round, and reactive to light.  Neck: Normal range of motion. Neck supple.  Cardiovascular: Normal rate, regular rhythm, normal heart sounds and intact distal pulses.   Pulmonary/Chest: Effort normal and breath sounds normal.  Musculoskeletal: She exhibits edema.  Lymphadenopathy:    She has no cervical adenopathy.  Neurological: She is alert and oriented to person, place, and time.  Skin: Skin is warm and dry.    ED Course  Procedures (including critical care time)  Labs Reviewed - No data to display No results found.   1. Hypertension, benign       MDM         Linna Hoff, MD 04/29/12 [phone]

## 2012-04-29 NOTE — ED Notes (Signed)
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  She denies any Sxs

## 2012-09-08 ENCOUNTER — Other Ambulatory Visit (HOSPITAL_COMMUNITY): Payer: Self-pay | Admitting: Internal Medicine

## 2012-10-01 ENCOUNTER — Other Ambulatory Visit (HOSPITAL_COMMUNITY): Payer: Self-pay | Admitting: Internal Medicine

## 2012-10-01 DIAGNOSIS — Z1231 Encounter for screening mammogram for malignant neoplasm of breast: Secondary | ICD-10-CM

## 2012-10-15 ENCOUNTER — Ambulatory Visit (HOSPITAL_COMMUNITY): Payer: Self-pay

## 2012-10-23 ENCOUNTER — Ambulatory Visit (HOSPITAL_COMMUNITY): Payer: Self-pay

## 2012-11-03 ENCOUNTER — Encounter (HOSPITAL_COMMUNITY): Payer: Self-pay | Admitting: *Deleted

## 2012-11-03 ENCOUNTER — Ambulatory Visit (HOSPITAL_COMMUNITY)
Admission: RE | Admit: 2012-11-03 | Discharge: 2012-11-03 | Disposition: A | Discharge status: Home / Self Care | Payer: Self-pay | Source: Ambulatory Visit | Attending: Internal Medicine | Admitting: Internal Medicine

## 2012-11-03 ENCOUNTER — Emergency Department (HOSPITAL_COMMUNITY): Payer: Self-pay

## 2012-11-03 ENCOUNTER — Emergency Department (HOSPITAL_COMMUNITY)
Admission: EM | Admit: 2012-11-03 | Discharge: 2012-11-03 | Disposition: A | Discharge status: Home / Self Care | Payer: Self-pay | Attending: Emergency Medicine | Admitting: Emergency Medicine

## 2012-11-03 DIAGNOSIS — Z7982 Long term (current) use of aspirin: Secondary | ICD-10-CM | POA: Insufficient documentation

## 2012-11-03 DIAGNOSIS — Z79899 Other long term (current) drug therapy: Secondary | ICD-10-CM | POA: Insufficient documentation

## 2012-11-03 DIAGNOSIS — F17200 Nicotine dependence, unspecified, uncomplicated: Secondary | ICD-10-CM | POA: Insufficient documentation

## 2012-11-03 DIAGNOSIS — M1710 Unilateral primary osteoarthritis, unspecified knee: Secondary | ICD-10-CM | POA: Insufficient documentation

## 2012-11-03 DIAGNOSIS — I10 Essential (primary) hypertension: Secondary | ICD-10-CM | POA: Insufficient documentation

## 2012-11-03 DIAGNOSIS — M25569 Pain in unspecified knee: Secondary | ICD-10-CM | POA: Insufficient documentation

## 2012-11-03 DIAGNOSIS — I2510 Atherosclerotic heart disease of native coronary artery without angina pectoris: Secondary | ICD-10-CM | POA: Insufficient documentation

## 2012-11-03 DIAGNOSIS — G8929 Other chronic pain: Secondary | ICD-10-CM | POA: Insufficient documentation

## 2012-11-03 IMAGING — MG MM DIGITAL SCREENING BILAT
8 of 12 series · 8 of 12 positions shown · non-contrast
Comparison: [DATE]

CLINICAL DATA: Screening.

DIGITAL BILATERAL SCREENING MAMMOGRAM WITH CAD

[R CC (1 of 2)]
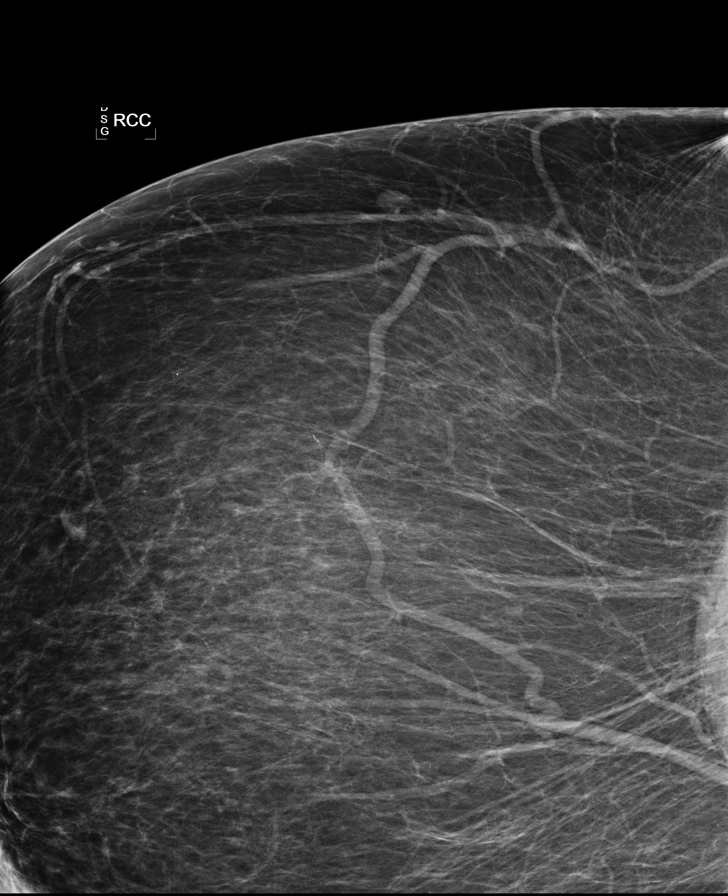

[R MLO (1 of 2)]
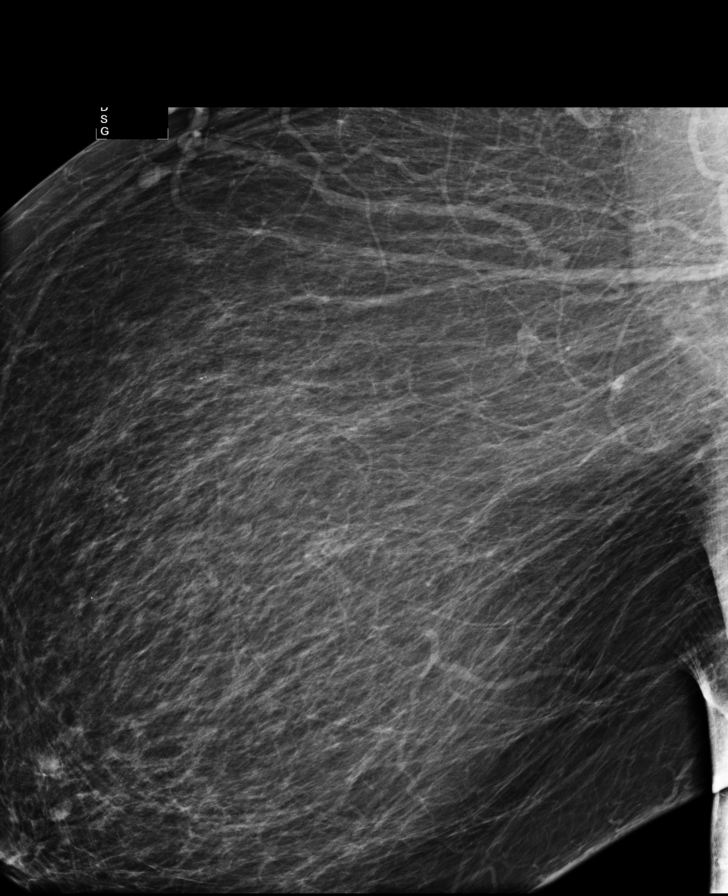

[L CC (1 of 2)]
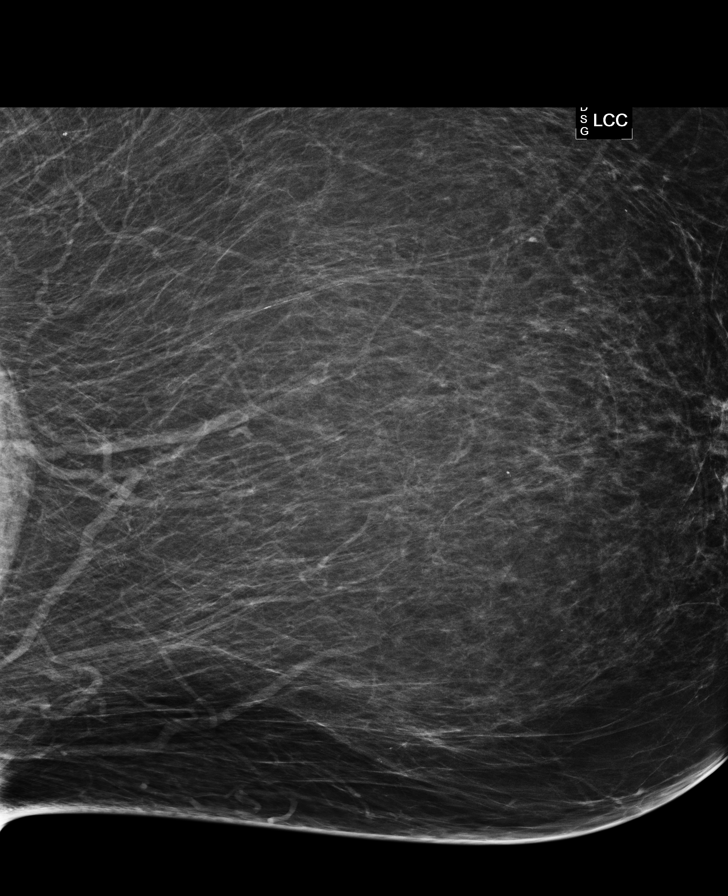

[L MLO (1 of 2)]
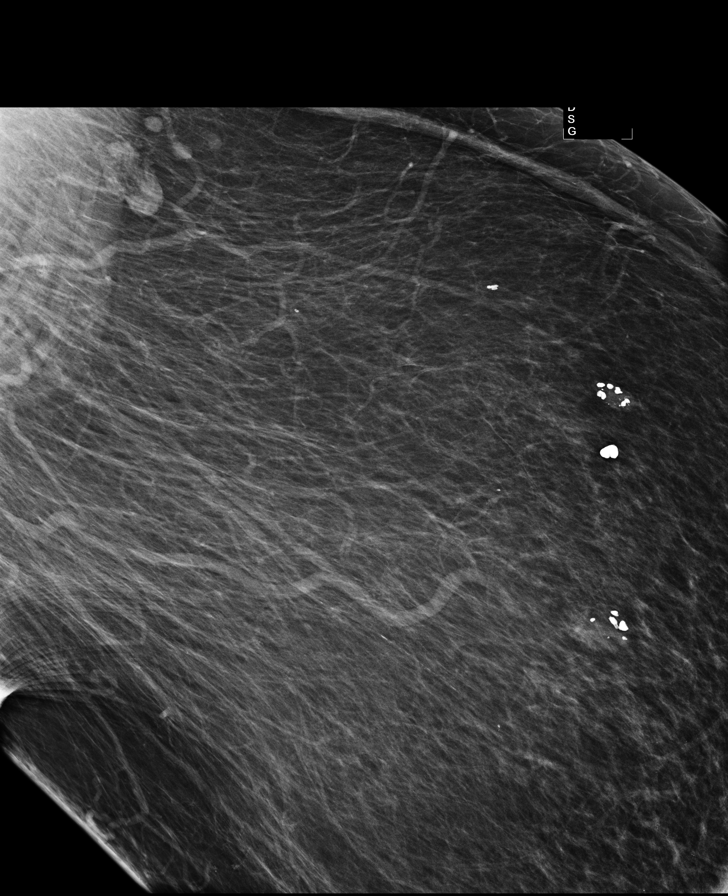

[L CC (2 of 2)]
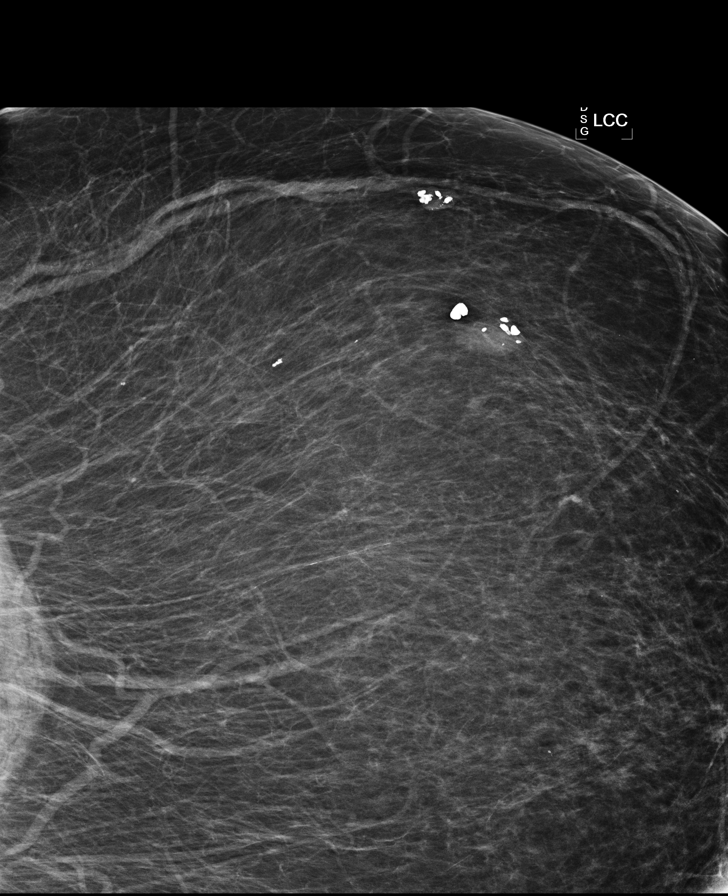

[R CC (2 of 2)]
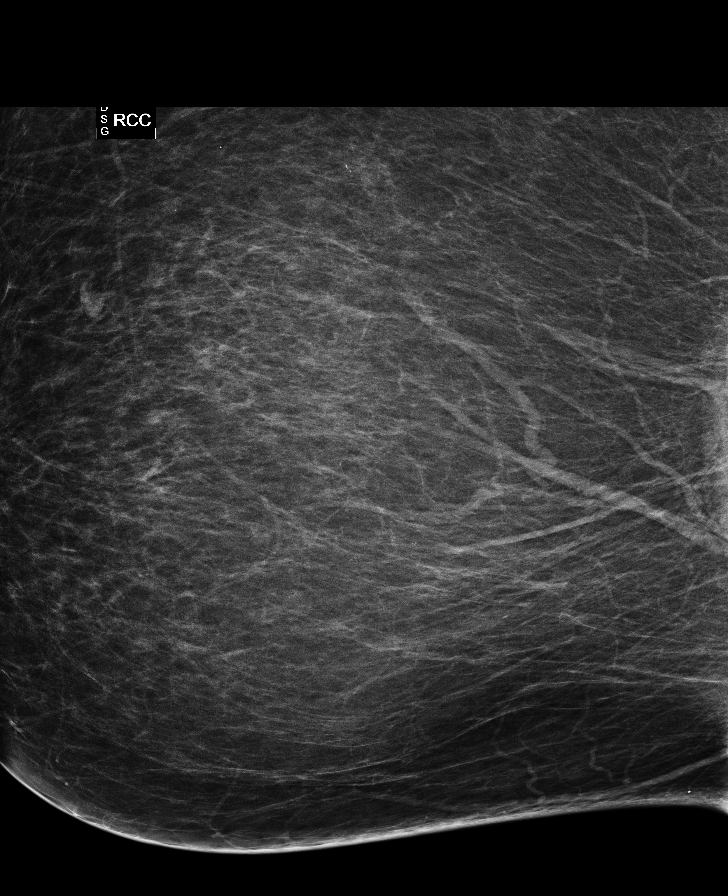

[L MLO (2 of 2)]
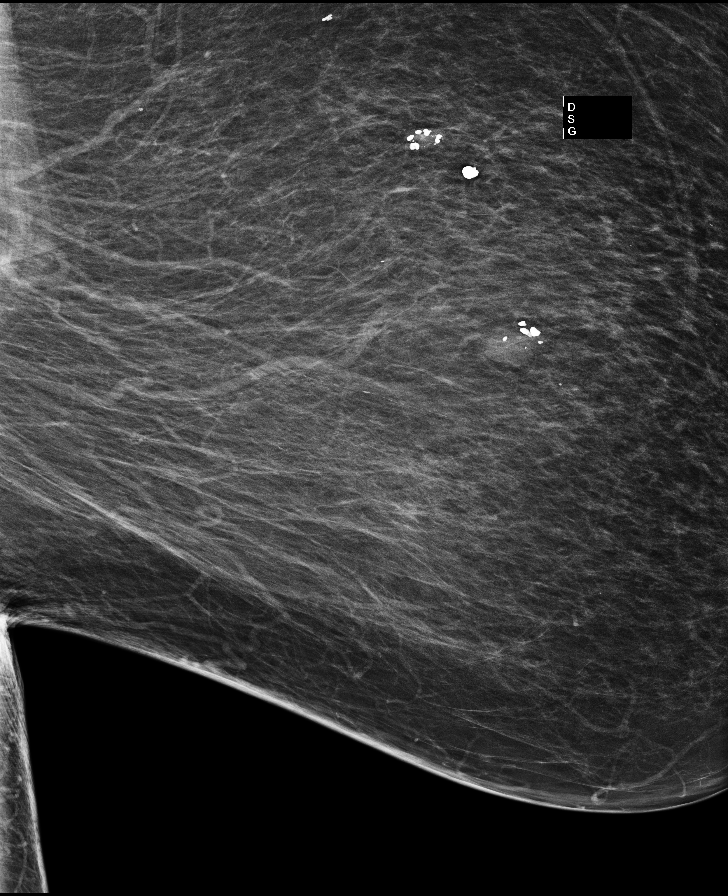

[R MLO (2 of 2)]
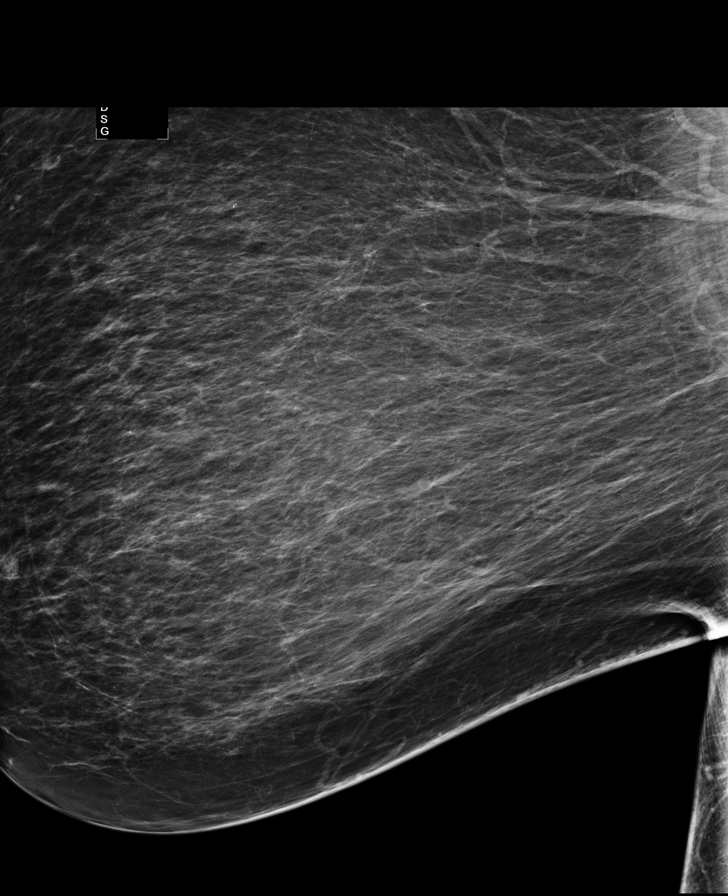

[8 of 12 positions shown; findings below may reference images not displayed]

FINDINGS: ACR Breast Density Category 1: The breast tissue is almost entirely
fatty.

There is no suspicious dominant mass, architectural distortion, or
calcification to suggest malignancy.

Images were processed with CAD.
IMPRESSION: No mammographic evidence of malignancy.

A result letter of this screening mammogram will be mailed directly
to the patient.

RECOMMENDATION:
Screening mammogram in one year. (Code:[5Y])

BI-RADS CATEGORY 1:  Negative.

## 2012-11-03 IMAGING — CR DG KNEE COMPLETE 4+V*R*
4 series · 4 of 4 positions shown · non-contrast
Comparison: None

CLINICAL DATA: Fall, twisting injury.

RIGHT KNEE - COMPLETE 4+ VIEW

[t knee ap right]
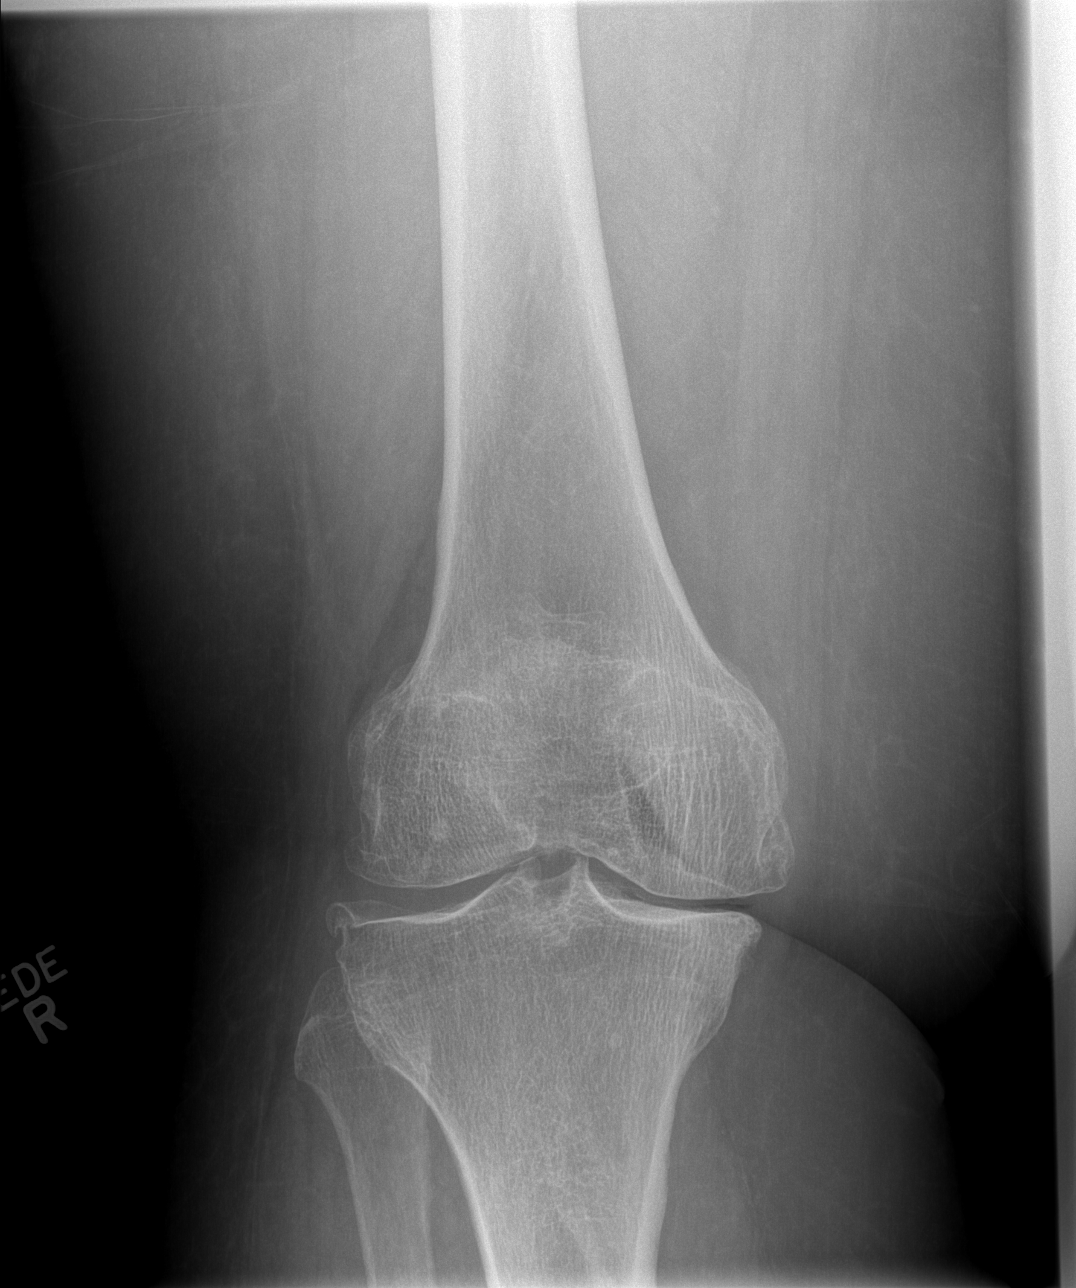

[t knee oblique right (1 of 2)]
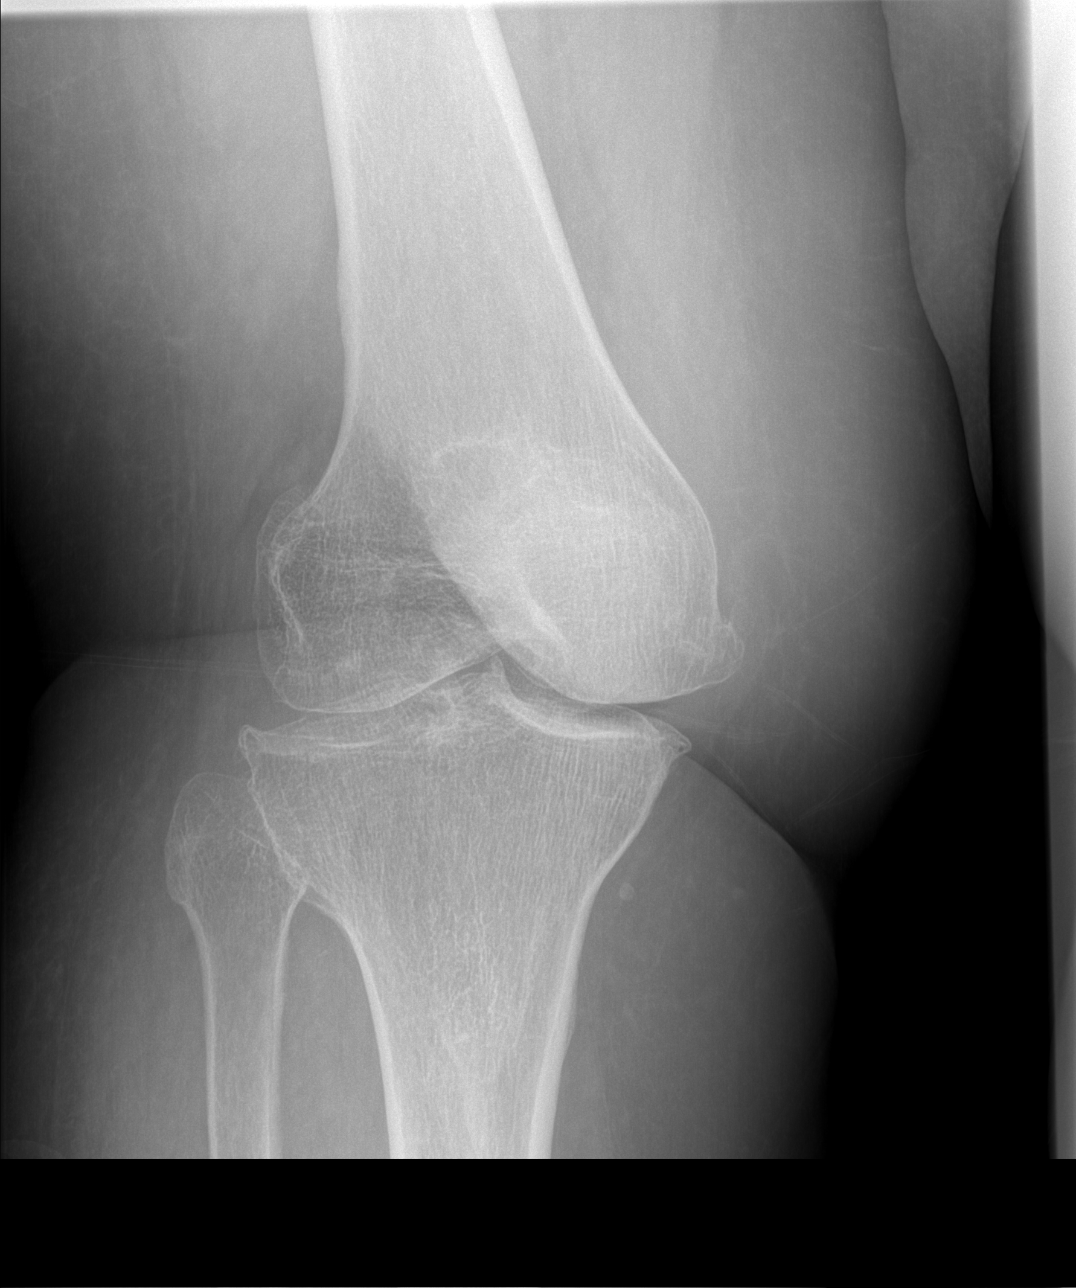

[t knee oblique right (2 of 2)]
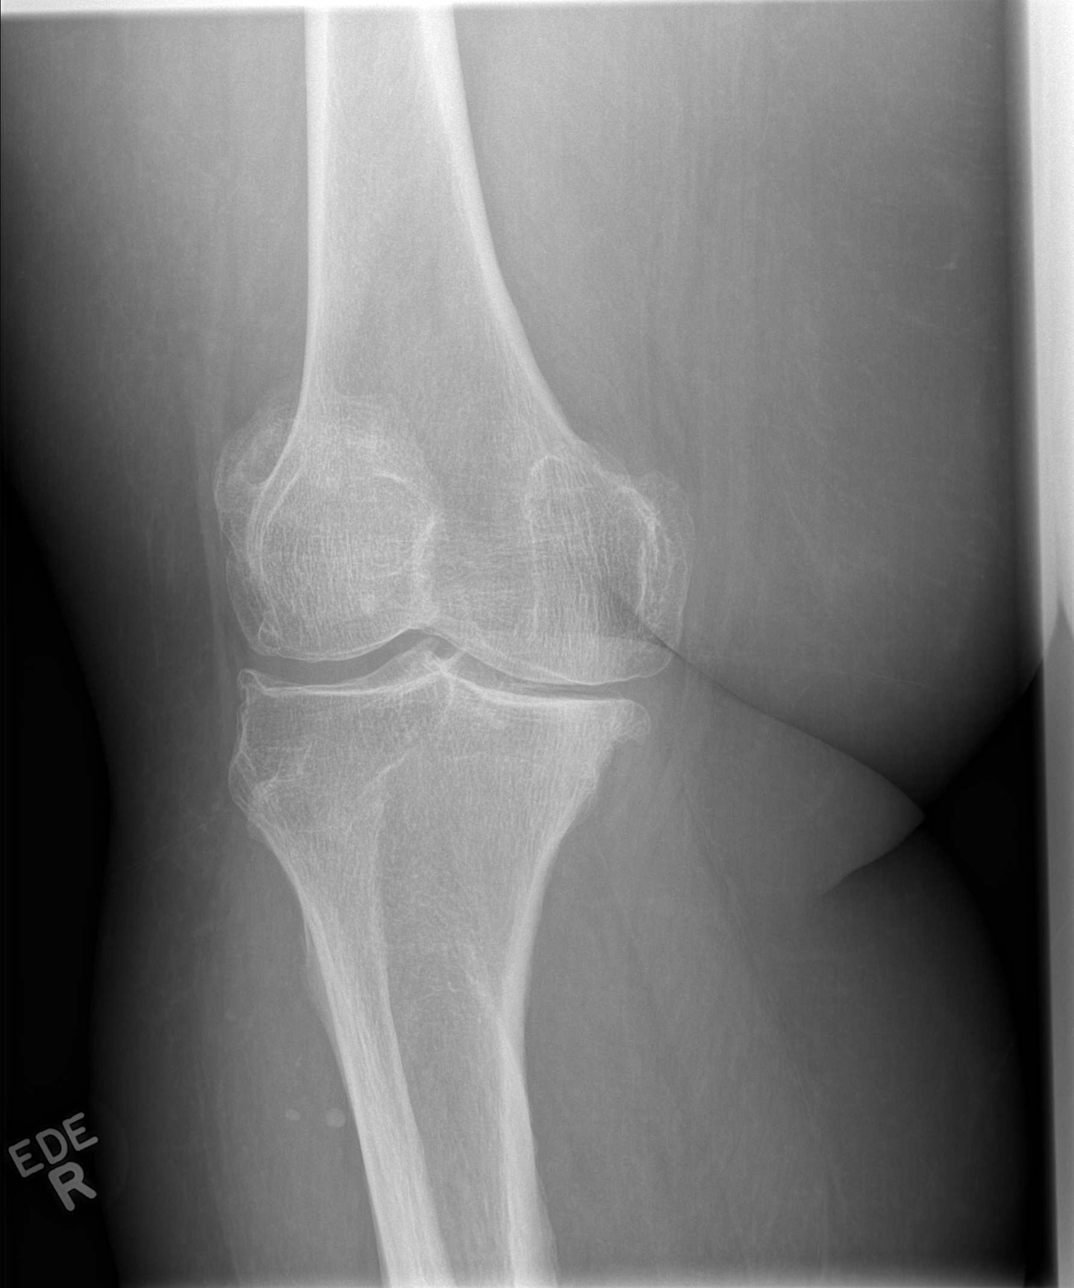

[t knee lat right]
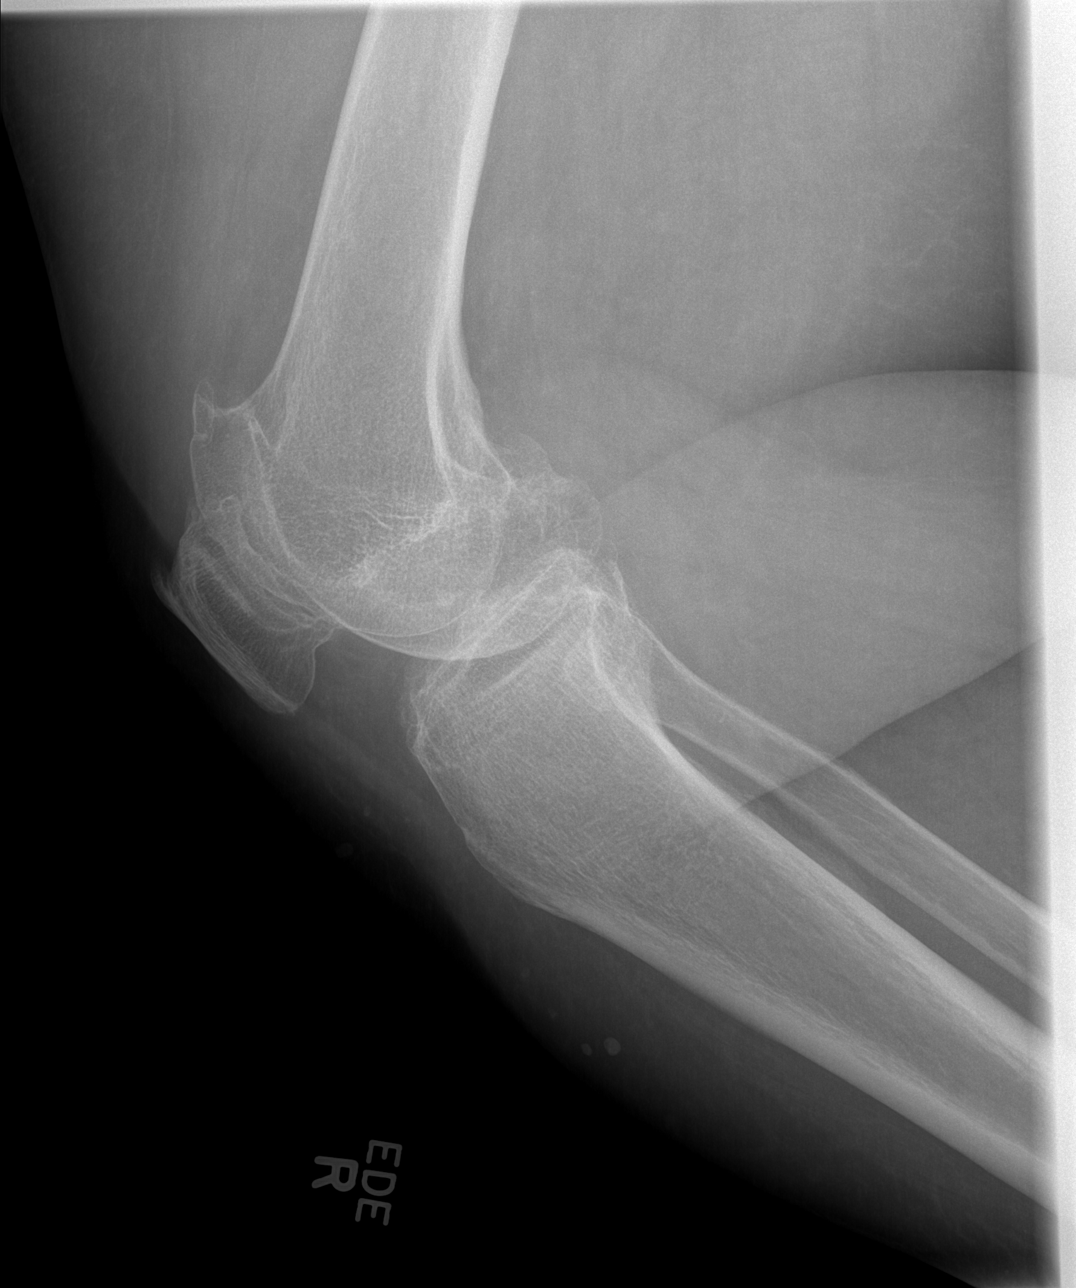

[4 of 4 positions shown; findings below may reference images not displayed]

FINDINGS: Advanced tricompartment degenerative changes, most
pronounced in the patellofemoral compartment. No acute bony
abnormality.  Specifically, no fracture, subluxation, or
dislocation.  Soft tissues are intact. No joint effusion.
IMPRESSION: No acute bony abnormality.

## 2012-11-03 MED ORDER — TRAMADOL HCL 50 MG PO TABS: 50.0000 mg | ORAL_TABLET | Freq: Four times a day (QID) | ORAL | Status: DC | PRN

## 2012-11-03 NOTE — ED Notes (Signed)
Pt with pain to both knees for quite some time.  However, last week she was going down steps and her R knee buckled under her.  Pt c/o pain to anterior knee.

## 2012-11-03 NOTE — ED Provider Notes (Signed)
History     CSN: [id number]  Arrival date & time 11/03/12  1051   First MD Initiated Contact with Patient 11/03/12 1134      No chief complaint on file.   (Consider location/radiation/quality/duration/timing/severity/associated sxs/prior treatment) HPI  Sheila Jacobs is a 62 y.o. female complaining of bilateral knee pain significantly worse on the right for many years. Patient feels that the knee is buckling and unstable now worsening over the course of the last 7 days. She denies prior trauma to the area, swelling, warmth, numbness or paresthesia. Preterm pain at 8/10 at worst, 5/10 right now, exacerbated by movement and walking.  Past Medical History  Diagnosis Date  . Hypertension   . Coronary artery disease     Past Surgical History  Procedure Laterality Date  . Tubal ligation      Family History  Problem Relation Age of Onset  . Hypertension Mother     History  Substance Use Topics  . Smoking status: Current Every Day Smoker -- 1.00 packs/day    Types: Cigarettes  . Smokeless tobacco: Not on file  . Alcohol Use: No    OB History   Grav Para Term Preterm Abortions TAB SAB Ect Mult Living                  Review of Systems  Constitutional: Negative for fever.  Respiratory: Negative for shortness of breath.   Cardiovascular: Negative for chest pain.  Gastrointestinal: Negative for nausea, vomiting, abdominal pain and diarrhea.  All other systems reviewed and are negative.    Allergies  Review of patient's allergies indicates no known allergies.  Home Medications   Current Outpatient Rx  Name  Route  Sig  Dispense  Refill  . aspirin 81 MG tablet   Oral   Take 81 mg by mouth daily.         . calcium citrate-vitamin D (CITRACAL+D) 315-200 MG-UNIT per tablet   Oral   Take 1 tablet by mouth daily.         . hydrochlorothiazide (HYDRODIURIL) 25 MG tablet   Oral   Take 1 tablet (25 mg total) by mouth daily.   30 tablet   1     There  were no vitals taken for this visit.  Physical Exam  Nursing note and vitals reviewed. Constitutional: She is oriented to person, place, and time. She appears well-developed and well-nourished. No distress.  obese  HENT:  Head: Normocephalic.  Mouth/Throat: Oropharynx is clear and moist.  Eyes: Conjunctivae and EOM are normal.  Cardiovascular: Normal rate.   Pulmonary/Chest: Effort normal. No stridor.  Musculoskeletal: Normal range of motion.  Bilateral knees show:  Positive crepitance.No deformity, erythema or abrasions. FROM. No effusion. Anterior and posterior drawer show no abnormal laxity. Stable to valgus and varus stress. Joint lines are non-tender. Neurovascularly intact. Pt ambulates with a slow and antalgic but not ataxic gait.    Neurological: She is alert and oriented to person, place, and time.  Psychiatric: She has a normal mood and affect.    ED Course  Procedures (including critical care time)  Labs Reviewed - No data to display Dg Knee Complete 4 Views Right  11/03/2012  *RADIOLOGY REPORT*  Clinical Data: Fall, twisting injury.  RIGHT KNEE - COMPLETE 4+ VIEW  Comparison: None  Findings: Advanced tricompartment degenerative changes, most pronounced in the patellofemoral compartment. No acute bony abnormality.  Specifically, no fracture, subluxation, or dislocation.  Soft tissues are intact. No joint effusion.  IMPRESSION: No acute bony abnormality.   Original Report Authenticated By: Charlett Nose, M.D.      1. Chronic knee pain, right   2. Arthritis       MDM  X-rays orders as patient has no insurance or outpatient care. Discussed results of the EKG with the patient and will give her a resource guide have advised weight reduction and NSAIDs for pain control. Encourage her to follow with a orthopedist when she becomes eligible for Medicare   Pt verbalized understanding and agrees with care plan. Outpatient follow-up and return precautions given.    Discharge  Medication List as of 11/03/2012  1:22 PM    START taking these medications   Details  traMADol (ULTRAM) 50 MG tablet Take 1 tablet (50 mg total) by mouth every 6 (six) hours as needed for pain., Starting 11/03/2012, Until Discontinued, [employer], PA-C 11/03/12 1735

## 2012-11-04 NOTE — ED Provider Notes (Signed)
Medical screening examination/treatment/procedure(s) were performed by non-physician practitioner and as supervising physician I was immediately available for consultation/collaboration. Devoria Albe, MD, Armando Gang   Ward Givens, MD 11/04/12 [phone]

## 2013-08-24 ENCOUNTER — Ambulatory Visit: Payer: Self-pay | Admitting: Cardiovascular Disease

## 2014-07-02 ENCOUNTER — Other Ambulatory Visit (HOSPITAL_COMMUNITY): Payer: Self-pay | Admitting: Primary Care

## 2014-07-02 DIAGNOSIS — Z1231 Encounter for screening mammogram for malignant neoplasm of breast: Secondary | ICD-10-CM

## 2014-07-16 ENCOUNTER — Ambulatory Visit (HOSPITAL_COMMUNITY)
Admission: RE | Admit: 2014-07-16 | Discharge: 2014-07-16 | Disposition: A | Discharge status: Home / Self Care | Payer: Medicare HMO | Source: Ambulatory Visit | Attending: Primary Care | Admitting: Primary Care

## 2014-07-16 DIAGNOSIS — Z1231 Encounter for screening mammogram for malignant neoplasm of breast: Secondary | ICD-10-CM | POA: Insufficient documentation

## 2014-07-16 LAB — HM MAMMOGRAPHY: HM Mammogram: NEGATIVE

## 2014-07-16 IMAGING — MG MM DIGITAL SCREENING BILAT
8 of 10 series · 8 of 10 positions shown · non-contrast
Comparison: Previous exam(s).

CLINICAL DATA: Screening.

EXAM:
DIGITAL SCREENING BILATERAL MAMMOGRAM WITH CAD

[R CC (1 of 2)]
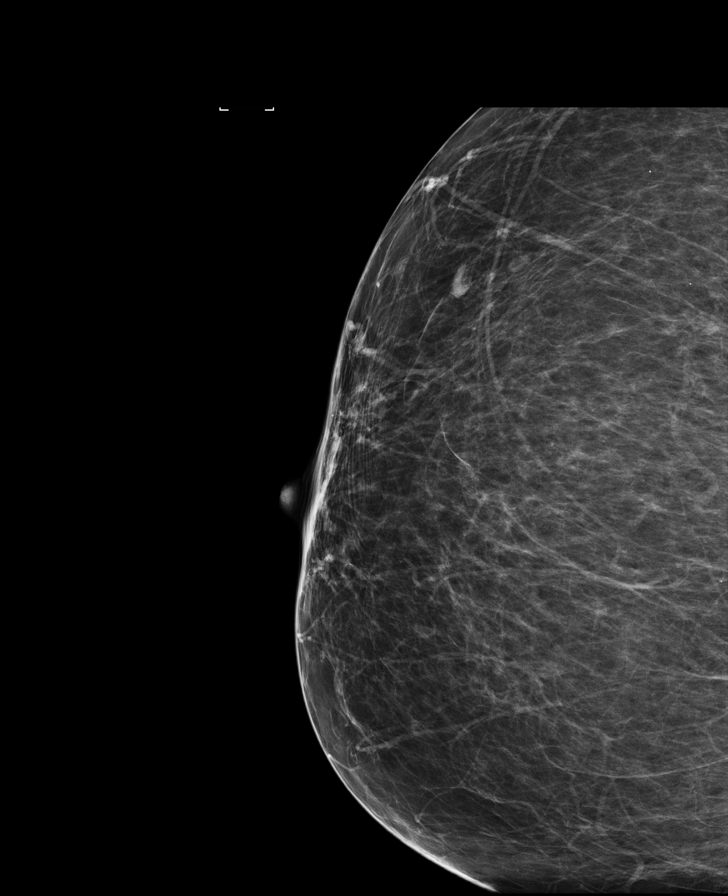

[L CC (1 of 2)]
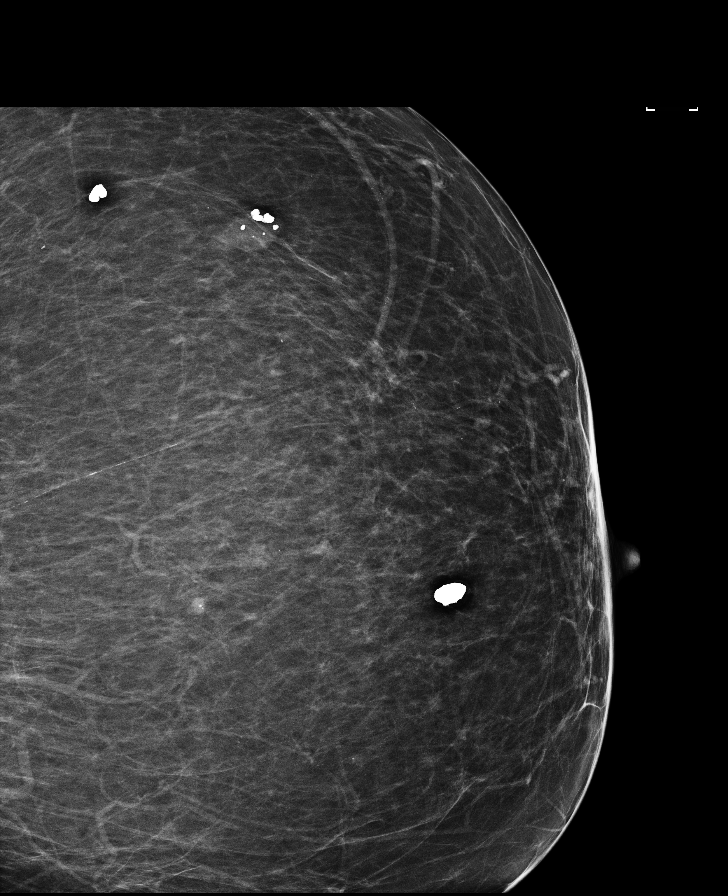

[L CC (2 of 2)]
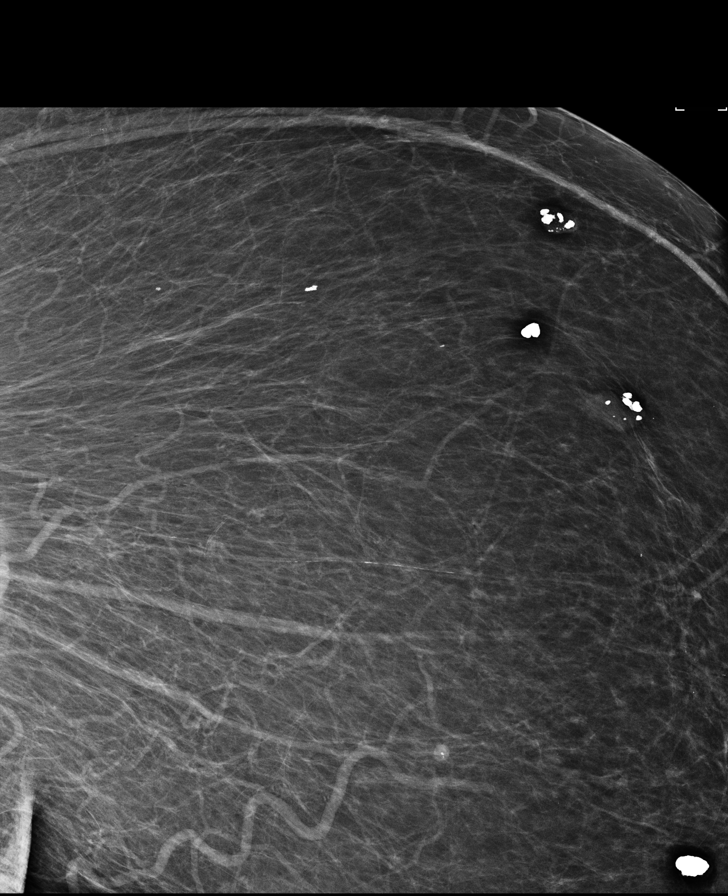

[L CV (1 of 2)]
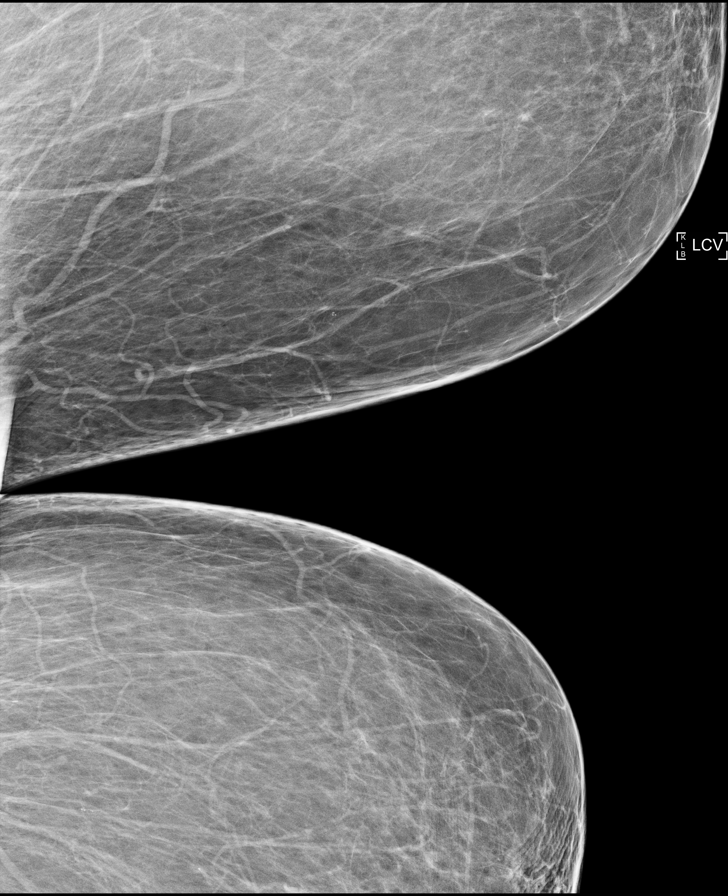

[R CC (2 of 2)]
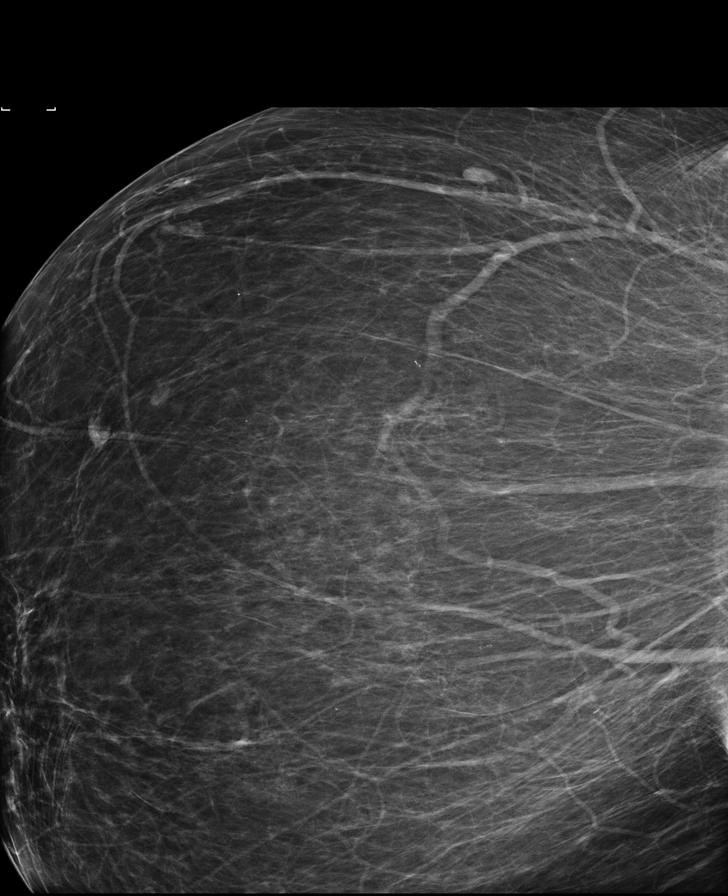

[R MLO]
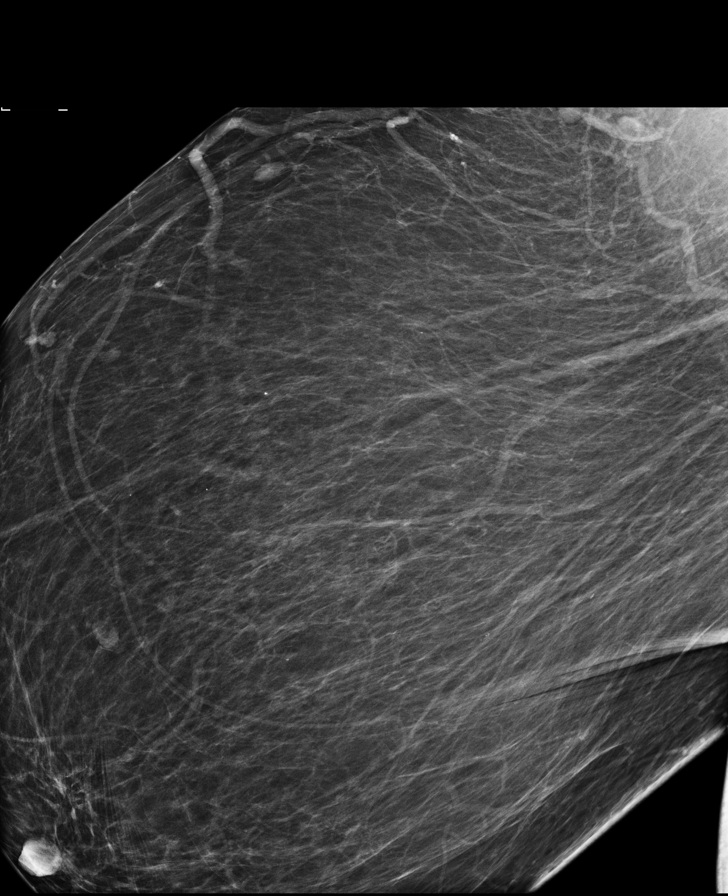

[L MLO]
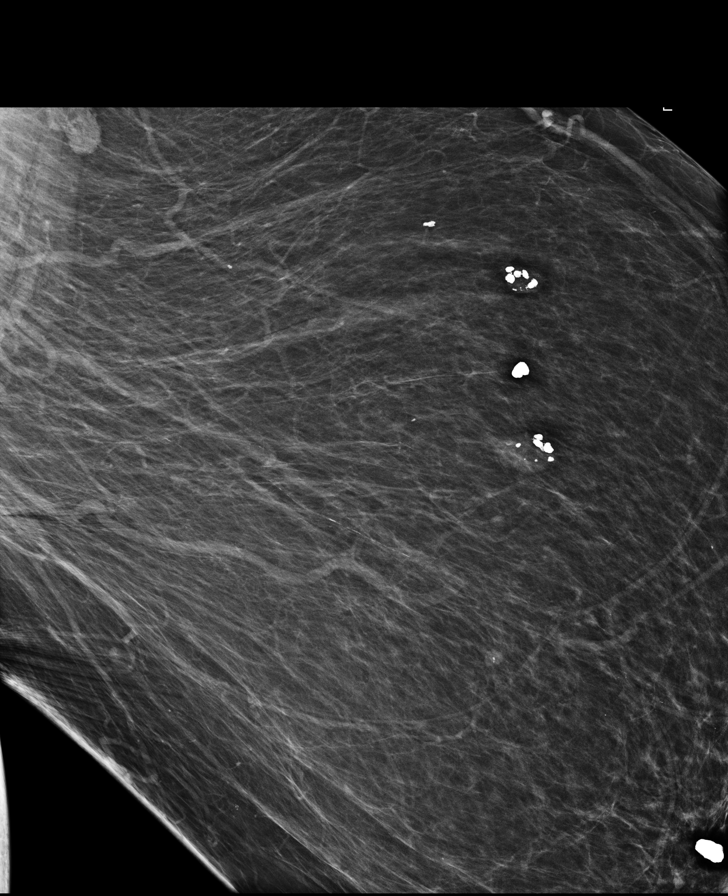

[L CV (2 of 2)]
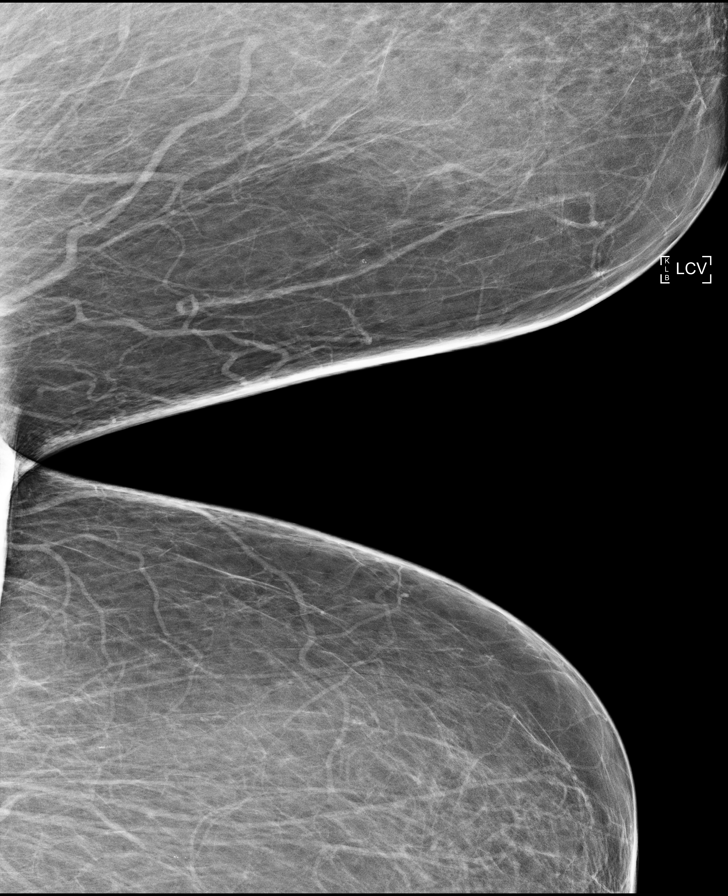

[8 of 10 positions shown; findings below may reference images not displayed]

ACR Breast Density Category b: There are scattered areas of
fibroglandular density.
FINDINGS: There are no findings suspicious for malignancy. Images were
processed with CAD.
IMPRESSION: No mammographic evidence of malignancy. A result letter of this
screening mammogram will be mailed directly to the patient.

RECOMMENDATION:
Screening mammogram in one year. (Code:[[place]])

BI-RADS CATEGORY  1: Negative.

## 2014-08-20 ENCOUNTER — Emergency Department (HOSPITAL_COMMUNITY)
Admission: EM | Admit: 2014-08-20 | Discharge: 2014-08-20 | Disposition: A | Discharge status: Home / Self Care | Payer: Medicare HMO | Attending: Emergency Medicine | Admitting: Emergency Medicine

## 2014-08-20 ENCOUNTER — Emergency Department (HOSPITAL_COMMUNITY): Payer: Medicare HMO

## 2014-08-20 ENCOUNTER — Encounter (HOSPITAL_COMMUNITY): Payer: Self-pay | Admitting: Emergency Medicine

## 2014-08-20 DIAGNOSIS — I10 Essential (primary) hypertension: Secondary | ICD-10-CM | POA: Insufficient documentation

## 2014-08-20 DIAGNOSIS — Z7982 Long term (current) use of aspirin: Secondary | ICD-10-CM | POA: Insufficient documentation

## 2014-08-20 DIAGNOSIS — R079 Chest pain, unspecified: Secondary | ICD-10-CM | POA: Diagnosis not present

## 2014-08-20 DIAGNOSIS — Z79899 Other long term (current) drug therapy: Secondary | ICD-10-CM | POA: Diagnosis not present

## 2014-08-20 DIAGNOSIS — Z720 Tobacco use: Secondary | ICD-10-CM | POA: Diagnosis not present

## 2014-08-20 DIAGNOSIS — I2510 Atherosclerotic heart disease of native coronary artery without angina pectoris: Secondary | ICD-10-CM | POA: Insufficient documentation

## 2014-08-20 LAB — BASIC METABOLIC PANEL
ANION GAP: 11 (ref 5–15)
BUN: 10 mg/dL (ref 6–23)
CHLORIDE: 101 meq/L (ref 96–112)
CO2: 27 mEq/L (ref 19–32)
Calcium: 9.9 mg/dL (ref 8.4–10.5)
Creatinine, Ser: 0.7 mg/dL (ref 0.50–1.10)
GFR calc non Af Amer: 89 mL/min — ABNORMAL LOW (ref >90)
Glucose, Bld: 93 mg/dL (ref 70–99)
POTASSIUM: 3.9 meq/L (ref 3.7–5.3)
SODIUM: 139 meq/L (ref 137–147)

## 2014-08-20 LAB — CBC WITH DIFFERENTIAL/PLATELET
BASOS ABS: 0 10*3/uL (ref 0.0–0.1)
BASOS PCT: 1 % (ref 0–1)
EOS ABS: 0.2 10*3/uL (ref 0.0–0.7)
Eosinophils Relative: 5 % (ref 0–5)
HCT: 35.9 % — ABNORMAL LOW (ref 36.0–46.0)
Hemoglobin: 11.7 g/dL — ABNORMAL LOW (ref 12.0–15.0)
Lymphocytes Relative: 37 % (ref 12–46)
Lymphs Abs: 1.4 10*3/uL (ref 0.7–4.0)
MCH: 30.6 pg (ref 26.0–34.0)
MCHC: 32.6 g/dL (ref 30.0–36.0)
MCV: 94 fL (ref 78.0–100.0)
Monocytes Absolute: 0.3 10*3/uL (ref 0.1–1.0)
Monocytes Relative: 8 % (ref 3–12)
NEUTROS ABS: 1.9 10*3/uL (ref 1.7–7.7)
NEUTROS PCT: 51 % (ref 43–77)
Platelets: 220 10*3/uL (ref 150–400)
RBC: 3.82 MIL/uL — ABNORMAL LOW (ref 3.87–5.11)
RDW: 12.6 % (ref 11.5–15.5)
WBC: 3.7 10*3/uL — ABNORMAL LOW (ref 4.0–10.5)

## 2014-08-20 LAB — PRO B NATRIURETIC PEPTIDE: PRO B NATRI PEPTIDE: 28.2 pg/mL (ref 0–125)

## 2014-08-20 LAB — I-STAT TROPONIN, ED: TROPONIN I, POC: 0 ng/mL (ref 0.00–0.08)

## 2014-08-20 IMAGING — DX DG CHEST 2V
2 series · 2 of 2 positions shown · non-contrast
Comparison: [DATE]

CLINICAL DATA: Shortness of breath and cough for 1 week.

EXAM:
CHEST  2 VIEW

[chest pa]
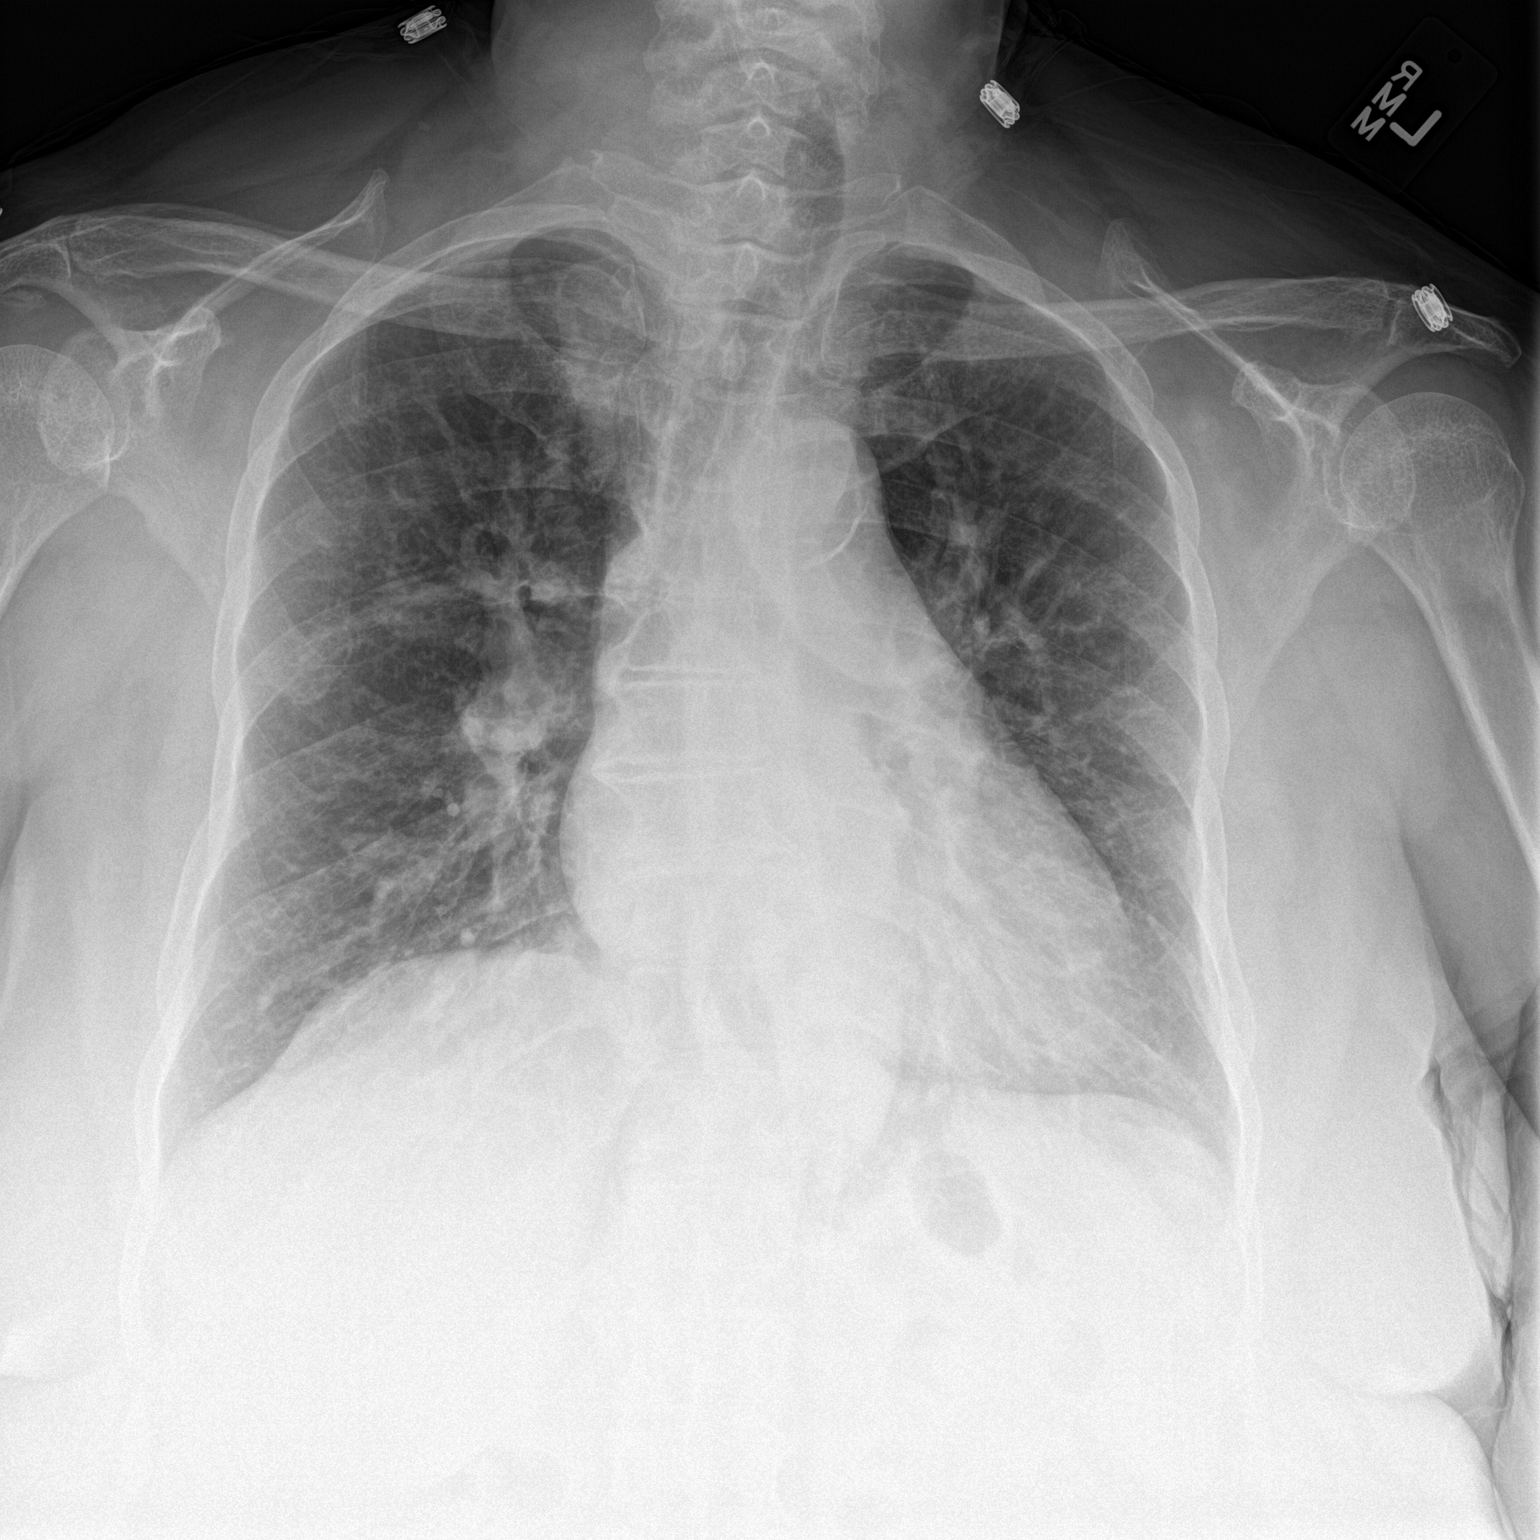

[chest lat]
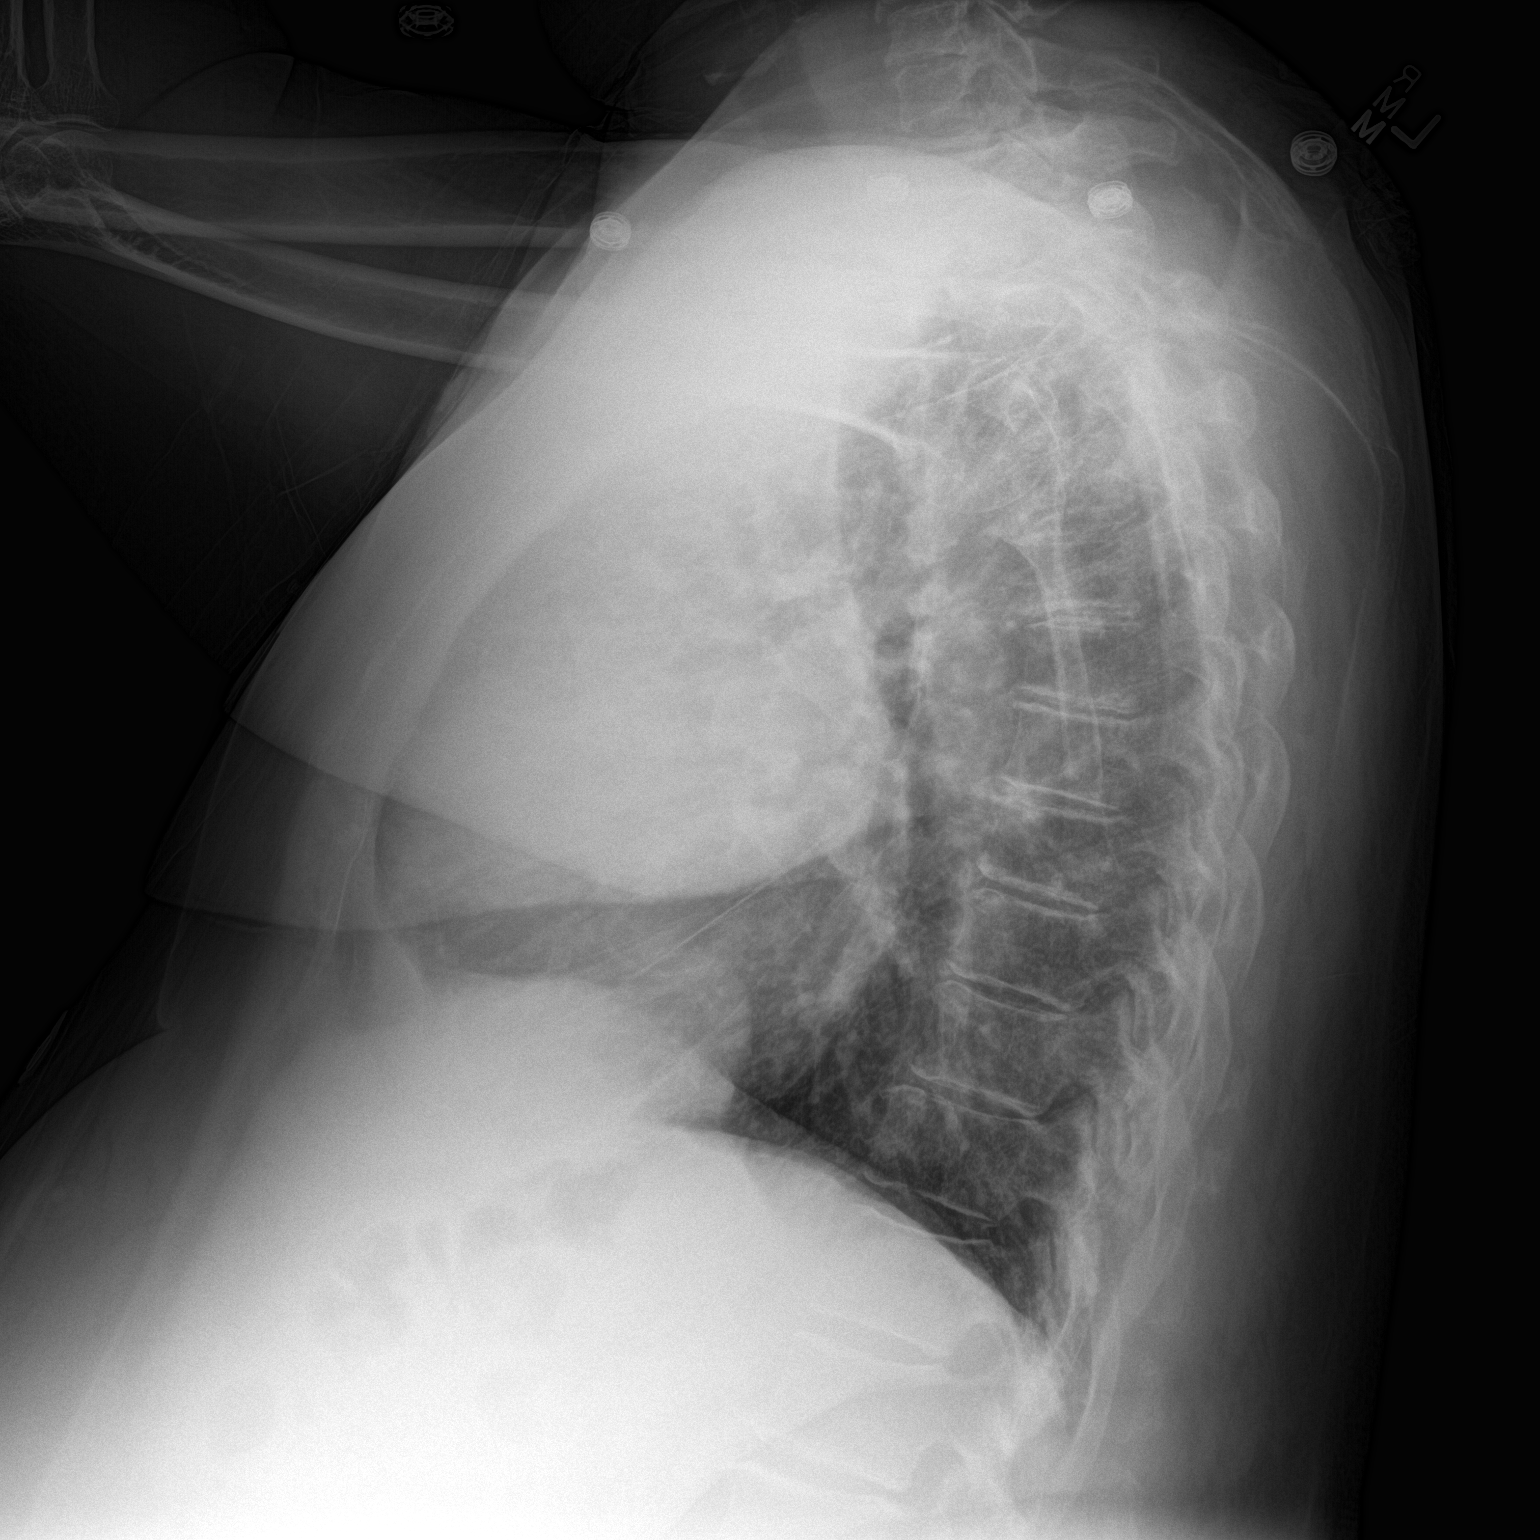

[2 of 2 positions shown; findings below may reference images not displayed]

FINDINGS: Heart size is normal. Main pulmonary arteries are slightly
prominent. There is chronic peribronchial thickening. No infiltrates
or effusions. No osseous abnormality.
IMPRESSION: Chronic peribronchial thickening.  No acute abnormalities.

## 2014-08-20 MED ORDER — FAMOTIDINE 20 MG PO TABS: 20.0000 mg | ORAL_TABLET | Freq: Two times a day (BID) | ORAL | Status: DC

## 2014-08-20 NOTE — Discharge Instructions (Signed)
Take an aspirin daily  Please call your doctor for a followup appointment within 24-48 hours. When you talk to your doctor please let them know that you were seen in the emergency department and have them acquire all of your records so that they can discuss the findings with you and formulate a treatment plan to fully care for your new and ongoing problems.   Chest Pain (Nonspecific) It is often hard to give a specific diagnosis for the cause of chest pain. There is always a chance that your pain could be related to something serious, such as a heart attack or a blood clot in the lungs. You need to follow up with your health care provider for further evaluation. CAUSES   Heartburn.  Pneumonia or bronchitis.  Anxiety or stress.  Inflammation around your heart (pericarditis) or lung (pleuritis or pleurisy).  A blood clot in the lung.  A collapsed lung (pneumothorax). It can develop suddenly on its own (spontaneous pneumothorax) or from trauma to the chest.  Shingles infection (herpes zoster virus). The chest wall is composed of bones, muscles, and cartilage. Any of these can be the source of the pain.  The bones can be bruised by injury.  The muscles or cartilage can be strained by coughing or overwork.  The cartilage can be affected by inflammation and become sore (costochondritis). DIAGNOSIS  Lab tests or other studies may be needed to find the cause of your pain. Your health care provider may have you take a test called an ambulatory electrocardiogram (ECG). An ECG records your heartbeat patterns over a 24-hour period. You may also have other tests, such as:  Transthoracic echocardiogram (TTE). During echocardiography, sound waves are used to evaluate how blood flows through your heart.  Transesophageal echocardiogram (TEE).  Cardiac monitoring. This allows your health care provider to monitor your heart rate and rhythm in real time.  Holter monitor. This is a portable device  that records your heartbeat and can help diagnose heart arrhythmias. It allows your health care provider to track your heart activity for several days, if needed.  Stress tests by exercise or by giving medicine that makes the heart beat faster. TREATMENT   Treatment depends on what may be causing your chest pain. Treatment may include:  Acid blockers for heartburn.  Anti-inflammatory medicine.  Pain medicine for inflammatory conditions.  Antibiotics if an infection is present.  You may be advised to change lifestyle habits. This includes stopping smoking and avoiding alcohol, caffeine, and chocolate.  You may be advised to keep your head raised (elevated) when sleeping. This reduces the chance of acid going backward from your stomach into your esophagus. Most of the time, nonspecific chest pain will improve within 2-3 days with rest and mild pain medicine.  HOME CARE INSTRUCTIONS   If antibiotics were prescribed, take them as directed. Finish them even if you start to feel better.  For the next few days, avoid physical activities that bring on chest pain. Continue physical activities as directed.  Do not use any tobacco products, including cigarettes, chewing tobacco, or electronic cigarettes.  Avoid drinking alcohol.  Only take medicine as directed by your health care provider.  Follow your health care provider's suggestions for further testing if your chest pain does not go away.  Keep any follow-up appointments you made. If you do not go to an appointment, you could develop lasting (chronic) problems with pain. If there is any problem keeping an appointment, call to reschedule. [place]  IF:   Your chest pain does not go away, even after treatment.  You have a rash with blisters on your chest.  You have a fever. SEEK IMMEDIATE MEDICAL CARE IF:   You have increased chest pain or pain that spreads to your arm, neck, jaw, back, or abdomen.  You have shortness of  breath.  You have an increasing cough, or you cough up blood.  You have severe back or abdominal pain.  You feel nauseous or vomit.  You have severe weakness.  You faint.  You have chills. This is an emergency. Do not wait to see if the pain will go away. Get medical help at once. Call your local emergency services (911 in [place] Do not drive yourself to the hospital. MAKE SURE YOU:   Understand these instructions.  Will watch your condition.  Will get help right away if you are not doing well or get worse. Document Released: 06/06/2005 Document Revised: 09/01/2013 Document Reviewed: 04/01/2008 [facility] Patient Information 2015 [place], [place]. This information is not intended to replace advice given to you by your health care provider. Make sure you discuss any questions you have with your health care provider.

## 2014-08-20 NOTE — ED Notes (Signed)
Pt states that she has had a productive cough more than her usually , pt did smoke for  over 30 years

## 2014-08-20 NOTE — ED Notes (Signed)
Attempted IV access x2 unsuccessful , 2RN to look

## 2014-08-20 NOTE — ED Notes (Signed)
Pt c/o mid sternal CP with cough x several days; pt sts some lip swelling that is now resolved; pt sts some URI sx recently as well

## 2014-08-20 NOTE — ED Notes (Signed)
Patient transported to X-ray 

## 2014-08-20 NOTE — ED Provider Notes (Signed)
CSN: [id number]     Arrival date & time 08/20/14  1033 History   First MD Initiated Contact with Patient 08/20/14 1117     Chief Complaint  Patient presents with  . Chest Pain     (Consider location/radiation/quality/duration/timing/severity/associated sxs/prior Treatment) HPI Comments: The patient is a 63 year old female who is morbidly obese and who has been treated for hypertension, coronary disease and is a 30 year smoker. She stopped smoking at Thanksgiving. She reports over the last week she has had intermittent cough which has resolved but has also had some left-sided chest pain. Currently this pain is located in the sternum and left side, it is 20 minutes in duration, comes and goes, not necessarily associated with exertion but she cannot say what it is associated with. She has minimal cough, no swelling, no fevers, no sore throat, chronic shortness of breath related to her smoking and denies nausea or diaphoresis. She had a stress test in 2004 which showed some reversible ischemia thought to be related to soft tissue attenuation versus ischemia, she denies any history of having a heart catheterization or stenting.  Patient is a 63 y.o. female presenting with chest pain. The history is provided by the patient.  Chest Pain   Past Medical History  Diagnosis Date  . Hypertension   . Coronary artery disease    Past Surgical History  Procedure Laterality Date  . Tubal ligation     Family History  Problem Relation Age of Onset  . Hypertension Mother    History  Substance Use Topics  . Smoking status: Current Every Day Smoker -- 1.00 packs/day    Types: Cigarettes  . Smokeless tobacco: Not on file  . Alcohol Use: No   OB History    No data available     Review of Systems  Cardiovascular: Positive for chest pain.  All other systems reviewed and are negative.     Allergies  Review of patient's allergies indicates no known allergies.  Home Medications   Prior to  Admission medications   Medication Sig Start Date End Date Taking? Authorizing Provider  aspirin 81 MG tablet Take 81 mg by mouth daily.   Yes Historical Provider, MD  calcium citrate-vitamin D (CITRACAL+D) 315-200 MG-UNIT per tablet Take 1 tablet by mouth daily.   Yes Historical Provider, MD  hydrochlorothiazide (HYDRODIURIL) 25 MG tablet Take 25 mg by mouth daily.   Yes Historical Provider, MD  ibuprofen (ADVIL,MOTRIN) 200 MG tablet Take 200 mg by mouth every 6 (six) hours as needed for moderate pain.   Yes Historical Provider, MD  famotidine (PEPCID) 20 MG tablet Take 1 tablet (20 mg total) by mouth 2 (two) times daily. 08/20/14   Johnna Acosta, MD  hydrochlorothiazide (HYDRODIURIL) 25 MG tablet Take 1 tablet (25 mg total) by mouth daily. 04/29/12 04/29/13  Billy Fischer, MD  traMADol (ULTRAM) 50 MG tablet Take 1 tablet (50 mg total) by mouth every 6 (six) hours as needed for pain. Patient not taking: Reported on 08/20/2014 11/03/12   Elmyra Ricks Pisciotta, PA-C   BP 138/73 mmHg  Pulse 69  Temp(Src) 97.8 F (36.6 C) (Oral)  Resp 18  SpO2 99% Physical Exam  Constitutional: She appears well-developed and well-nourished. No distress.  HENT:  Head: Normocephalic and atraumatic.  Mouth/Throat: Oropharynx is clear and moist. No oropharyngeal exudate.  Eyes: Conjunctivae and EOM are normal. Pupils are equal, round, and reactive to light. Right eye exhibits no discharge. Left eye exhibits no discharge. No scleral  icterus.  Neck: Normal range of motion. Neck supple. No JVD present. No thyromegaly present.  Cardiovascular: Normal rate, regular rhythm, normal heart sounds and intact distal pulses.  Exam reveals no gallop and no friction rub.   No murmur heard. Pulmonary/Chest: Effort normal and breath sounds normal. No respiratory distress. She has no wheezes. She has no rales.  Abdominal: Soft. Bowel sounds are normal. She exhibits no distension and no mass. There is no tenderness.  Musculoskeletal:  Normal range of motion. She exhibits no edema or tenderness.  Lymphadenopathy:    She has no cervical adenopathy.  Neurological: She is alert. Coordination normal.  Skin: Skin is warm and dry. No rash noted. No erythema.  Psychiatric: She has a normal mood and affect. Her behavior is normal.  Nursing note and vitals reviewed.   ED Course  Procedures (including critical care time) Labs Review Labs Reviewed  BASIC METABOLIC PANEL - Abnormal; Notable for the following:    GFR calc non Af Amer 89 (*)    All other components within normal limits  CBC WITH DIFFERENTIAL - Abnormal; Notable for the following:    WBC 3.7 (*)    RBC 3.82 (*)    Hemoglobin 11.7 (*)    HCT 35.9 (*)    All other components within normal limits  PRO B NATRIURETIC PEPTIDE  I-STAT TROPOININ, ED    Imaging Review Dg Chest 2 View  08/20/2014   CLINICAL DATA:  Shortness of breath and cough for 1 week.  EXAM: CHEST  2 VIEW  COMPARISON:  02/17/2010  FINDINGS: Heart size is normal. Main pulmonary arteries are slightly prominent. There is chronic peribronchial thickening. No infiltrates or effusions. No osseous abnormality.  IMPRESSION: Chronic peribronchial thickening.  No acute abnormalities.   Electronically Signed   By: Rozetta Nunnery M.D.   On: 08/20/2014 13:47     EKG Interpretation   Date/Time:  Friday August 20 2014 11:04:52 EST Ventricular Rate:  73 PR Interval:  196 QRS Duration: 92 QT Interval:  392 QTC Calculation: 431 R Axis:     Text Interpretation:  Normal sinus rhythm Normal ECG since last tracing no  significant change Confirmed by Alonzo Loving  MD, Esteen Delpriore ([id number]) on 08/20/2014  11:46:22 AM      MDM   Final diagnoses:  Chest pain, unspecified chest pain type    The patient has ongoing intermittent chest pain, I have no explanation for the patient's symptoms based on her clinical exam as she has no signs of pneumonia or pneumothorax. Chest x-ray, labs ordered, EKG nonischemic.   interpreted  EKG, interpreted chest x-ray, labwork unremarkable, offered and recommended patient to be admitted to the hospital overnight for cardiac rule out , patient refuses stating she has to go home and be with family to watch grandchildren, patient given option to return at any time should she change her mind, she agrees, at this time there is no objective evidence of ischemic injury. She will be given cardiology phone number and encouraged to follow up for a closely, she has expressed her understanding and agrees to the indications for return.  Meds given in ED:  Medications - No data to display  New Prescriptions   FAMOTIDINE (PEPCID) 20 MG TABLET    Take 1 tablet (20 mg total) by mouth 2 (two) times daily.      Johnna Acosta, MD 08/20/14 [phone]

## 2014-09-02 ENCOUNTER — Encounter: Payer: Self-pay | Admitting: Cardiology

## 2014-09-02 ENCOUNTER — Ambulatory Visit (INDEPENDENT_AMBULATORY_CARE_PROVIDER_SITE_OTHER): Payer: Medicare HMO | Admitting: Cardiology

## 2014-09-02 VITALS — BP 120/78 | HR 65 | Ht 64.0 in | Wt 281.7 lb

## 2014-09-02 DIAGNOSIS — R072 Precordial pain: Secondary | ICD-10-CM

## 2014-09-02 DIAGNOSIS — R0789 Other chest pain: Secondary | ICD-10-CM

## 2014-09-02 DIAGNOSIS — R0989 Other specified symptoms and signs involving the circulatory and respiratory systems: Secondary | ICD-10-CM

## 2014-09-02 NOTE — Patient Instructions (Signed)
Your physician recommends that you schedule a follow-up appointment in: As Needed  Your physician has requested that you have an exercise tolerance test. For further information please visit [email or web address]. Please also follow instruction sheet, as given.  Your physician has requested that you have a carotid duplex. This test is an ultrasound of the carotid arteries in your neck. It looks at blood flow through these arteries that supply the brain with blood. Allow one hour for this exam. There are no restrictions or special instructions.

## 2014-09-02 NOTE — Progress Notes (Signed)
HPI The patient presents as a referral emergency room. She has a vague past history of coronary disease though I can't find any actual records. It does look like she was years ago.  She was in the emergency room a few weeks ago with chest discomfort. I reviewed these records. She said this happened out of the blue. It felt like she had been punched. It was mid sternal. There was no radiation. She thought it was severe and lasted for about 20 minutes. However, there was no objective evidence of ischemia. She was not admitted. She said that she stopped smoking around this time and she has since not had any further pain. She's not particularly active although she walks to get to the bus. With this level of activity she denies any cardiovascular symptoms.  No Known Allergies  Current Outpatient Prescriptions  Medication Sig Dispense Refill  . aspirin 81 MG tablet Take 81 mg by mouth daily.    . calcium citrate-vitamin D (CITRACAL+D) 315-200 MG-UNIT per tablet Take 1 tablet by mouth daily.    . hydrochlorothiazide (HYDRODIURIL) 25 MG tablet Take 25 mg by mouth daily.    Marland Kitchen ibuprofen (ADVIL,MOTRIN) 200 MG tablet Take 200 mg by mouth every 6 (six) hours as needed for moderate pain.    . hydrochlorothiazide (HYDRODIURIL) 25 MG tablet Take 1 tablet (25 mg total) by mouth daily. 30 tablet 1   No current facility-administered medications for this visit.    Past Medical History  Diagnosis Date  . Hypertension   . CAD (coronary artery disease)   . Hyperlipidemia     Past Surgical History  Procedure Laterality Date  . Tubal ligation      Family History  Problem Relation Age of Onset  . Hypertension Mother   . Heart disease Mother     Pacemaker  . Kidney disease Mother     History   Social History  . Marital Status: Single    Spouse Name: N/A    Number of Children: 1  . Years of Education: N/A   Occupational History  . Not on file.   Social History Main Topics  . Smoking status:  Former Smoker -- 1.00 packs/day for 45 years    Types: Cigarettes    Quit date: 08/06/2014  . Smokeless tobacco: Not on file  . Alcohol Use: No  . Drug Use: No  . Sexual Activity: Not on file   Other Topics Concern  . Not on file   Social History Narrative   Lives with daughter.      ROS:  As stated in the HPI and negative for all other systems.  PHYSICAL EXAM Ht 5\' 4"  (1.626 m)  Wt 281 lb 11.2 oz (127.778 kg)  BMI 48.33 kg/m2  GENERAL:  Well appearing HEENT:  Pupils equal round and reactive, fundi not visualized, oral mucosa unremarkable NECK:  No jugular venous distention, waveform within normal limits, carotid upstroke brisk and symmetric, right bruit,  no thyromegaly LYMPHATICS:  No cervical, inguinal adenopathy LUNGS:  Clear to auscultation bilaterally 2 CHEST:  Unremarkable HEART:  PMI not displaced or sustained,S1 and S2 within normal limits, no S3, no S4, no clicks, no rubs, no murmurs ABD:  Flat, positive bowel sounds normal in frequency in pitch, no bruits, no rebound, no guarding, no midline pulsatile mass, no hepatomegaly, no splenomegaly EXT:  2 plus pulses throughout, no edema, no cyanosis no clubbing, lymphedema SKIN:  No rashes no nodules NEURO:  Cranial nerves II through  XII grossly intact, motor grossly intact throughout PSYCH:  Cognitively intact, oriented to person place and time   EKG:  Sinus rhythm, rate 65, axis within normal limits, intervals within normal limits, no acute ST-T wave changes.  09/02/2014   ASSESSMENT AND PLAN  CHEST PAIN:  She reports a history of coronary disease but I don't have any of these records and cannot find them. Her symptoms are atypical. I would like to see if she can do a POET (Plain Old Exercise Treadmill) to exclude obstructive coronary disease.  BRUIT:  She will have carotid Dopplers.

## 2014-09-06 ENCOUNTER — Telehealth (HOSPITAL_COMMUNITY): Payer: Self-pay | Admitting: *Deleted

## 2014-09-18 ENCOUNTER — Encounter (HOSPITAL_COMMUNITY): Payer: Self-pay | Admitting: Emergency Medicine

## 2014-09-18 ENCOUNTER — Observation Stay (HOSPITAL_COMMUNITY)
Admission: EM | Admit: 2014-09-18 | Discharge: 2014-09-20 | Disposition: A | Discharge status: Home / Self Care | Payer: Medicare HMO | Attending: Internal Medicine | Admitting: Internal Medicine

## 2014-09-18 ENCOUNTER — Emergency Department (HOSPITAL_COMMUNITY): Payer: Medicare HMO

## 2014-09-18 DIAGNOSIS — I2510 Atherosclerotic heart disease of native coronary artery without angina pectoris: Secondary | ICD-10-CM | POA: Diagnosis not present

## 2014-09-18 DIAGNOSIS — Z87891 Personal history of nicotine dependence: Secondary | ICD-10-CM | POA: Diagnosis not present

## 2014-09-18 DIAGNOSIS — Z6841 Body Mass Index (BMI) 40.0 and over, adult: Secondary | ICD-10-CM | POA: Diagnosis not present

## 2014-09-18 DIAGNOSIS — E669 Obesity, unspecified: Secondary | ICD-10-CM | POA: Diagnosis not present

## 2014-09-18 DIAGNOSIS — I25119 Atherosclerotic heart disease of native coronary artery with unspecified angina pectoris: Secondary | ICD-10-CM

## 2014-09-18 DIAGNOSIS — Z7982 Long term (current) use of aspirin: Secondary | ICD-10-CM | POA: Insufficient documentation

## 2014-09-18 DIAGNOSIS — I517 Cardiomegaly: Secondary | ICD-10-CM | POA: Insufficient documentation

## 2014-09-18 DIAGNOSIS — E785 Hyperlipidemia, unspecified: Secondary | ICD-10-CM | POA: Insufficient documentation

## 2014-09-18 DIAGNOSIS — E876 Hypokalemia: Secondary | ICD-10-CM | POA: Insufficient documentation

## 2014-09-18 DIAGNOSIS — R0789 Other chest pain: Secondary | ICD-10-CM | POA: Diagnosis not present

## 2014-09-18 DIAGNOSIS — Z9851 Tubal ligation status: Secondary | ICD-10-CM | POA: Diagnosis not present

## 2014-09-18 DIAGNOSIS — R072 Precordial pain: Secondary | ICD-10-CM | POA: Diagnosis present

## 2014-09-18 DIAGNOSIS — R079 Chest pain, unspecified: Secondary | ICD-10-CM

## 2014-09-18 DIAGNOSIS — I10 Essential (primary) hypertension: Secondary | ICD-10-CM | POA: Diagnosis not present

## 2014-09-18 DIAGNOSIS — I420 Dilated cardiomyopathy: Secondary | ICD-10-CM | POA: Insufficient documentation

## 2014-09-18 DIAGNOSIS — I059 Rheumatic mitral valve disease, unspecified: Secondary | ICD-10-CM

## 2014-09-18 LAB — BASIC METABOLIC PANEL
ANION GAP: 8 (ref 5–15)
BUN: 9 mg/dL (ref 6–23)
CHLORIDE: 99 meq/L (ref 96–112)
CO2: 27 mmol/L (ref 19–32)
Calcium: 9.7 mg/dL (ref 8.4–10.5)
Creatinine, Ser: 0.77 mg/dL (ref 0.50–1.10)
GFR calc Af Amer: >90 mL/min (ref >90)
GFR calc non Af Amer: 86 mL/min — ABNORMAL LOW (ref >90)
Glucose, Bld: 120 mg/dL — ABNORMAL HIGH (ref 70–99)
Potassium: 3.8 mmol/L (ref 3.5–5.1)
SODIUM: 134 mmol/L — AB (ref 135–145)

## 2014-09-18 LAB — CBC
HEMATOCRIT: 35 % — AB (ref 36.0–46.0)
HEMOGLOBIN: 11.5 g/dL — AB (ref 12.0–15.0)
MCH: 30.4 pg (ref 26.0–34.0)
MCHC: 32.9 g/dL (ref 30.0–36.0)
MCV: 92.6 fL (ref 78.0–100.0)
PLATELETS: 262 10*3/uL (ref 150–400)
RBC: 3.78 MIL/uL — ABNORMAL LOW (ref 3.87–5.11)
RDW: 12.5 % (ref 11.5–15.5)
WBC: 6.5 10*3/uL (ref 4.0–10.5)

## 2014-09-18 LAB — TROPONIN I
Troponin I: <0.03 ng/mL
Troponin I: <0.03 ng/mL

## 2014-09-18 LAB — I-STAT TROPONIN, ED: Troponin i, poc: 0 ng/mL (ref 0.00–0.08)

## 2014-09-18 LAB — BRAIN NATRIURETIC PEPTIDE: B NATRIURETIC PEPTIDE 5: 34.5 pg/mL (ref 0.0–100.0)

## 2014-09-18 LAB — TSH: TSH: 0.543 u[IU]/mL (ref 0.350–4.500)

## 2014-09-18 IMAGING — CR DG CHEST 2V
2 series · 2 of 2 positions shown · non-contrast
Comparison: Chest CT [DATE]

CLINICAL DATA: Chest pain, hypertension

EXAM:
CHEST  2 VIEW

[chest pa]
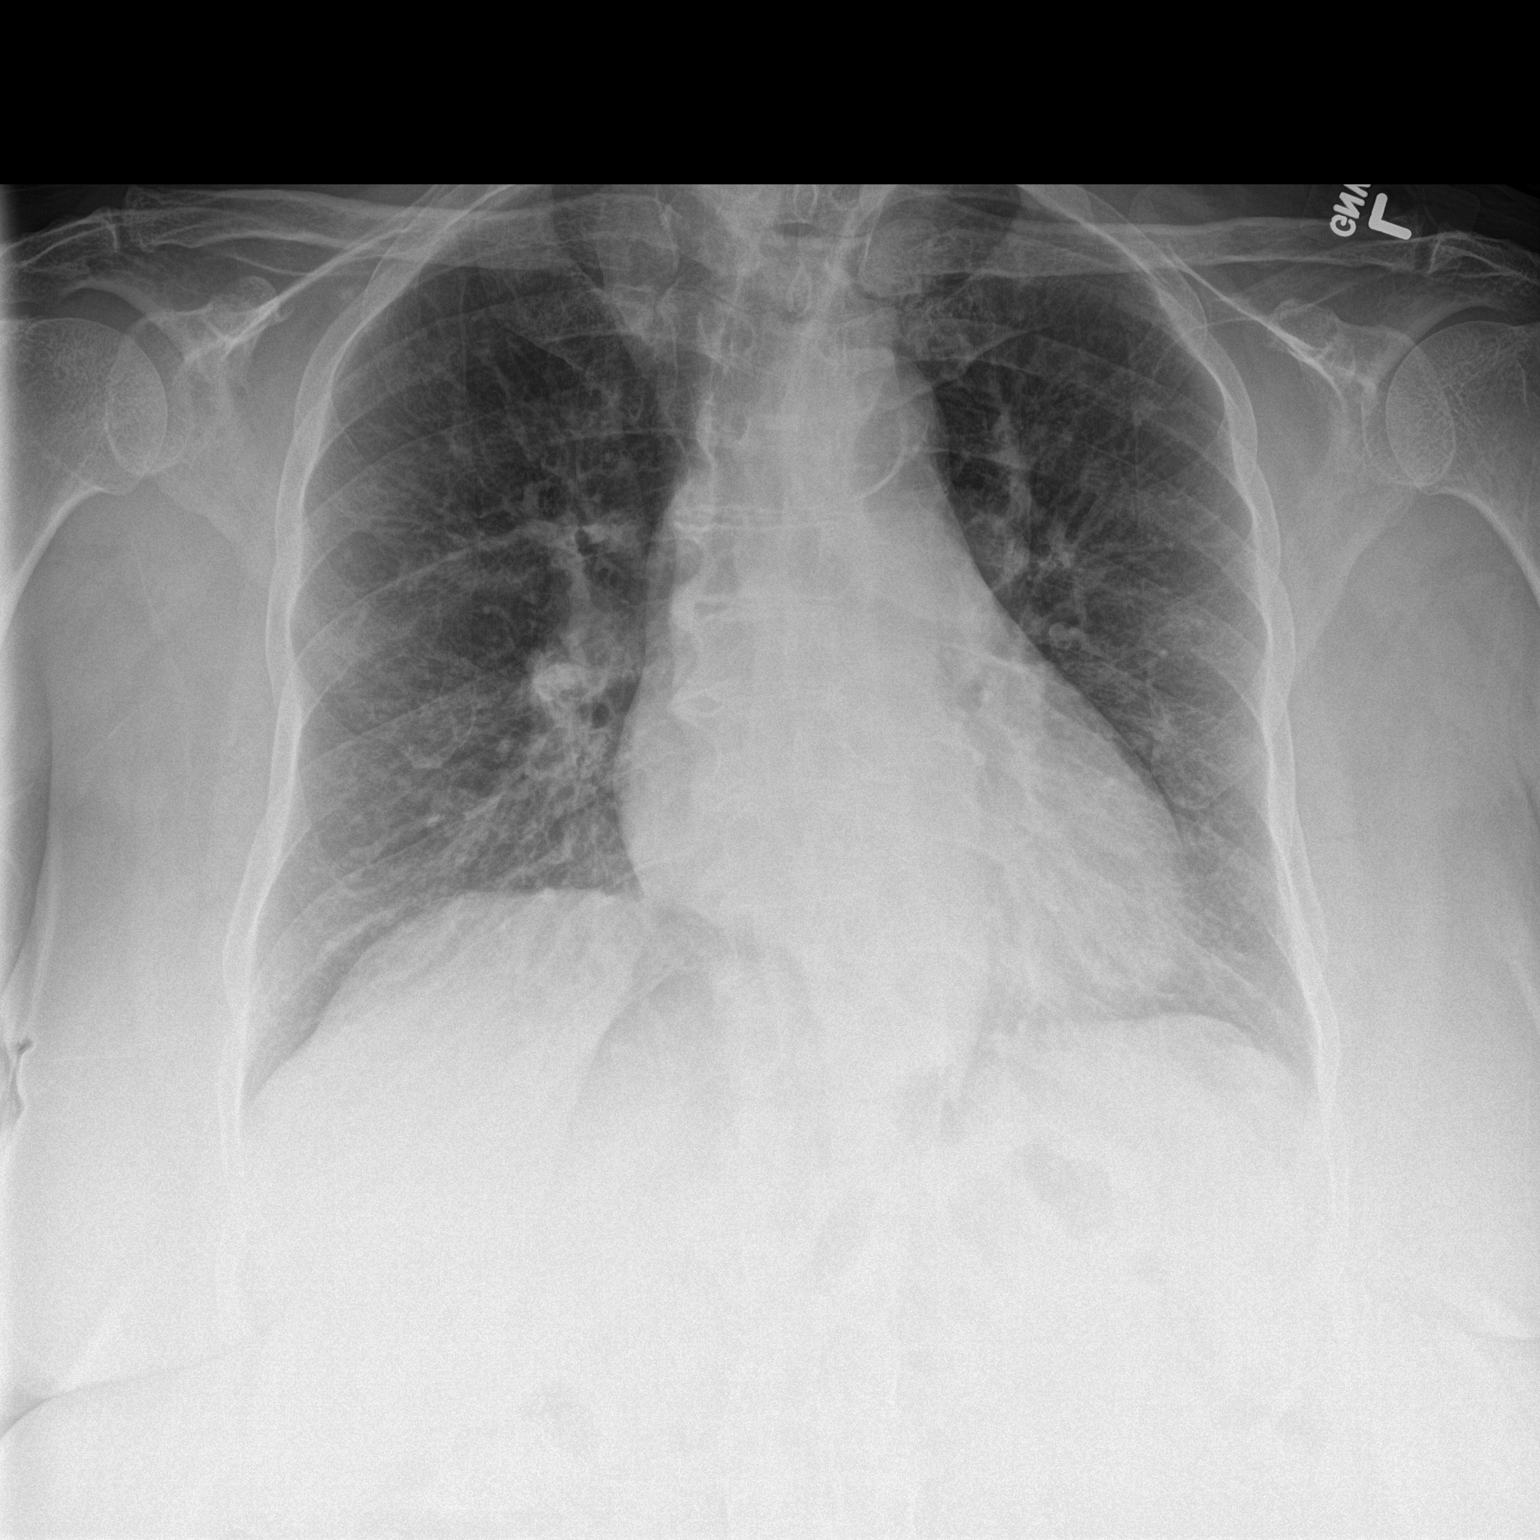

[chest lat]
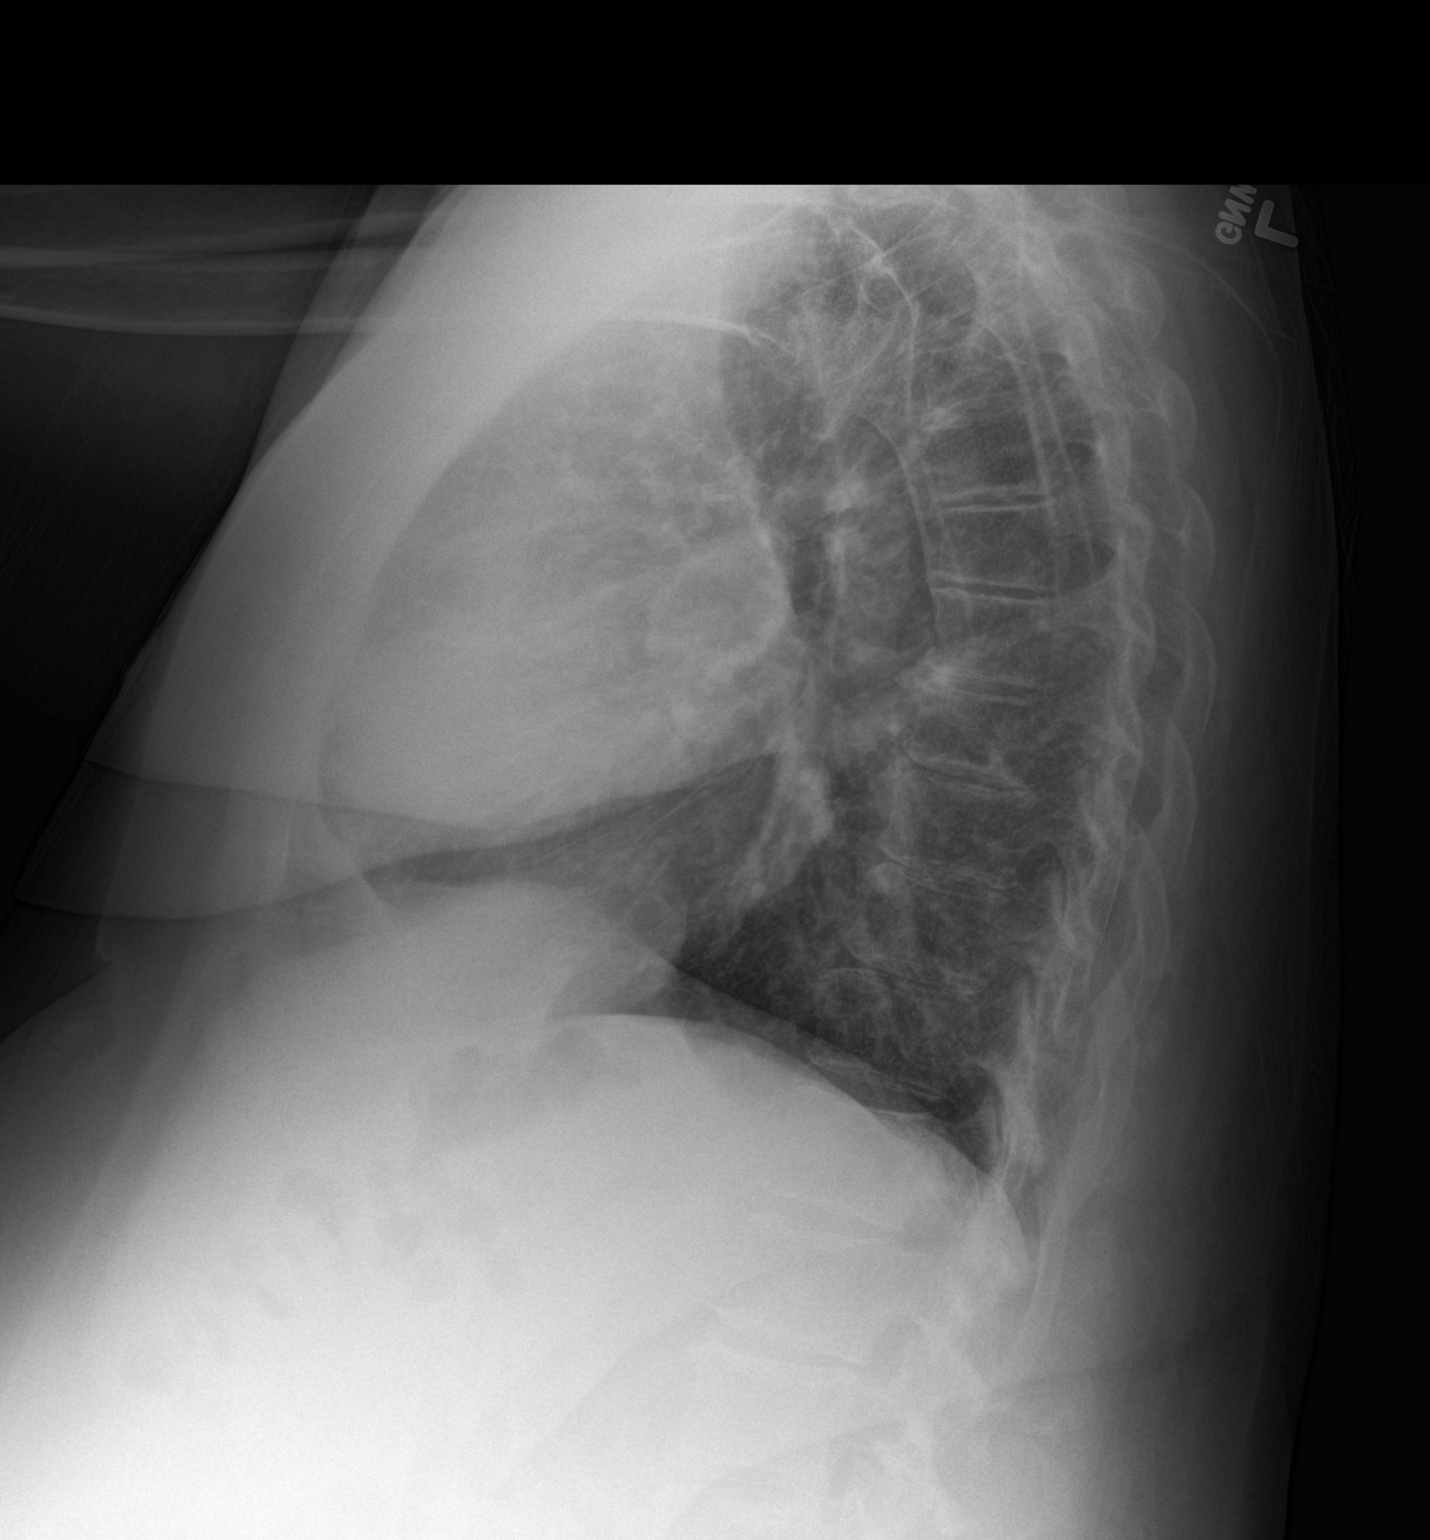

[2 of 2 positions shown; findings below may reference images not displayed]

FINDINGS: Normal cardiac silhouette. Chronic bronchitic markings. No effusion,
infiltrate, pneumothorax. Degenerative osteophytosis of the thoracic
spine.
IMPRESSION: Chronic bronchitic markings without acute findings.

## 2014-09-18 MED ORDER — ENOXAPARIN SODIUM 40 MG/0.4ML ~~LOC~~ SOLN
40.0000 mg | Freq: Every day | SUBCUTANEOUS | Status: DC
  Administered 2014-09-18 – 2014-09-20 (×3): 40 mg via SUBCUTANEOUS
  Filled 2014-09-18 (×3): qty 0.4

## 2014-09-18 MED ORDER — HYDROMORPHONE HCL 1 MG/ML IJ SOLN: 0.5000 mg | INTRAMUSCULAR | Status: DC | PRN

## 2014-09-18 MED ORDER — METOPROLOL TARTRATE 12.5 MG HALF TABLET
12.5000 mg | ORAL_TABLET | Freq: Two times a day (BID) | ORAL | Status: DC
  Administered 2014-09-18 – 2014-09-20 (×4): 12.5 mg via ORAL
  Filled 2014-09-18 (×5): qty 1

## 2014-09-18 MED ORDER — SODIUM CHLORIDE 0.9 % IV SOLN: 250.0000 mL | INTRAVENOUS | Status: DC | PRN

## 2014-09-18 MED ORDER — NITROGLYCERIN 0.4 MG SL SUBL: 0.4000 mg | SUBLINGUAL_TABLET | SUBLINGUAL | Status: DC | PRN

## 2014-09-18 MED ORDER — HYDROCHLOROTHIAZIDE 25 MG PO TABS
25.0000 mg | ORAL_TABLET | Freq: Every day | ORAL | Status: DC
Start: 2014-09-18 — End: 2014-09-18
  Administered 2014-09-18: 25 mg via ORAL
  Filled 2014-09-18: qty 1

## 2014-09-18 MED ORDER — SODIUM CHLORIDE 0.9 % IJ SOLN: 3.0000 mL | INTRAMUSCULAR | Status: DC | PRN

## 2014-09-18 MED ORDER — SODIUM CHLORIDE 0.9 % IJ SOLN
3.0000 mL | Freq: Two times a day (BID) | INTRAMUSCULAR | Status: DC
  Administered 2014-09-18: 3 mL via INTRAVENOUS

## 2014-09-18 MED ORDER — OXYCODONE HCL 5 MG PO TABS: 5.0000 mg | ORAL_TABLET | ORAL | Status: DC | PRN

## 2014-09-18 MED ORDER — CALCIUM CARBONATE-VITAMIN D 500-200 MG-UNIT PO TABS
1.0000 | ORAL_TABLET | Freq: Every day | ORAL | Status: DC
  Administered 2014-09-18 – 2014-09-20 (×3): 1 via ORAL
  Filled 2014-09-18 (×4): qty 1

## 2014-09-18 MED ORDER — PANTOPRAZOLE SODIUM 40 MG PO TBEC
40.0000 mg | DELAYED_RELEASE_TABLET | Freq: Every day | ORAL | Status: DC
  Administered 2014-09-19 – 2014-09-20 (×2): 40 mg via ORAL
  Filled 2014-09-18 (×3): qty 1

## 2014-09-18 MED ORDER — ACETAMINOPHEN 325 MG PO TABS
650.0000 mg | ORAL_TABLET | Freq: Four times a day (QID) | ORAL | Status: DC | PRN
  Administered 2014-09-18 – 2014-09-19 (×2): 650 mg via ORAL
  Filled 2014-09-18 (×2): qty 2

## 2014-09-18 MED ORDER — SODIUM CHLORIDE 0.9 % IJ SOLN
3.0000 mL | Freq: Two times a day (BID) | INTRAMUSCULAR | Status: DC
  Administered 2014-09-18 – 2014-09-19 (×3): 3 mL via INTRAVENOUS

## 2014-09-18 MED ORDER — NITROGLYCERIN 2 % TD OINT
0.5000 [in_us] | TOPICAL_OINTMENT | Freq: Four times a day (QID) | TRANSDERMAL | Status: DC
  Administered 2014-09-19 – 2014-09-20 (×6): 0.5 [in_us] via TOPICAL
  Filled 2014-09-18: qty 30

## 2014-09-18 MED ORDER — ASPIRIN 325 MG PO TABS
325.0000 mg | ORAL_TABLET | Freq: Every day | ORAL | Status: DC
  Administered 2014-09-18 – 2014-09-20 (×3): 325 mg via ORAL
  Filled 2014-09-18 (×3): qty 1

## 2014-09-18 MED ORDER — ASPIRIN 81 MG PO CHEW
324.0000 mg | CHEWABLE_TABLET | Freq: Once | ORAL | Status: AC
  Administered 2014-09-18: 243 mg via ORAL
  Filled 2014-09-18: qty 4

## 2014-09-18 MED ORDER — ONDANSETRON HCL 4 MG PO TABS: 4.0000 mg | ORAL_TABLET | Freq: Four times a day (QID) | ORAL | Status: DC | PRN

## 2014-09-18 MED ORDER — ONDANSETRON HCL 4 MG/2ML IJ SOLN: 4.0000 mg | Freq: Four times a day (QID) | INTRAMUSCULAR | Status: DC | PRN

## 2014-09-18 MED ORDER — ACETAMINOPHEN 650 MG RE SUPP: 650.0000 mg | Freq: Four times a day (QID) | RECTAL | Status: DC | PRN

## 2014-09-18 MED ORDER — ALUM & MAG HYDROXIDE-SIMETH 200-200-20 MG/5ML PO SUSP: 30.0000 mL | Freq: Four times a day (QID) | ORAL | Status: DC | PRN

## 2014-09-18 NOTE — ED Notes (Signed)
MD at bedside. 

## 2014-09-18 NOTE — ED Notes (Signed)
Transporting patient to new room assignment. 

## 2014-09-18 NOTE — Progress Notes (Signed)
  Echocardiogram 2D Echocardiogram has been performed.  Sheila Jacobs 09/18/2014, 12:05 PM

## 2014-09-18 NOTE — Progress Notes (Signed)
UR completed 

## 2014-09-18 NOTE — ED Provider Notes (Signed)
CSN: [id number]     Arrival date & time 09/18/14  0101 History  This chart was scribed for Mariea Clonts, MD by Jeanell Sparrow, ED Scribe. This patient was seen in room B16C/B16C and the patient's care was started at 1:28 AM.     Chief Complaint  Patient presents with  . Chest Pain   The history is provided by the patient. No language interpreter was used.   HPI Comments: Sheila Jacobs is a 64 y.o. female who presents to the Emergency Department complaining of intermittent moderate left sided chest pain that started tonight. She states that the pain started when she was at rest. She reports that she had multiple episodes lasting about 15 minutes. She states that the pain is a sharp sensation. She reports that she has seen here this month for the same pain. She states that she has some ankle swelling, abdominal pain, nausea, cough, and light headedness. She reports that she has a hx of HTN, and quit smoking 3 months ago.   Past Medical History  Diagnosis Date  . Hypertension   . CAD (coronary artery disease)   . Hyperlipidemia   . Obesity   . Heavy cigarette smoker    Past Surgical History  Procedure Laterality Date  . Tubal ligation     Family History  Problem Relation Age of Onset  . Hypertension Mother   . Heart disease Mother     Pacemaker  . Kidney disease Mother    History  Substance Use Topics  . Smoking status: Former Smoker -- 1.00 packs/day for 45 years    Types: Cigarettes    Quit date: 08/06/2014  . Smokeless tobacco: Not on file  . Alcohol Use: No   OB History    No data available     Review of Systems  Respiratory: Positive for cough and shortness of breath.   Cardiovascular: Positive for chest pain and leg swelling.  Gastrointestinal: Positive for abdominal pain.  Neurological: Positive for light-headedness.  All other systems reviewed and are negative.   Allergies  Review of patient's allergies indicates no known allergies.  Home Medications    Prior to Admission medications   Medication Sig Start Date End Date Taking? Authorizing Provider  aspirin 81 MG tablet Take 81 mg by mouth daily.   Yes Historical Provider, MD  calcium citrate-vitamin D (CITRACAL+D) 315-200 MG-UNIT per tablet Take 1 tablet by mouth daily.   Yes Historical Provider, MD  hydrochlorothiazide (HYDRODIURIL) 25 MG tablet Take 1 tablet (25 mg total) by mouth daily. 04/29/12 09/18/14 Yes Billy Fischer, MD  ibuprofen (ADVIL,MOTRIN) 200 MG tablet Take 400 mg by mouth every 6 (six) hours as needed for moderate pain.    Yes Historical Provider, MD  hydrochlorothiazide (HYDRODIURIL) 25 MG tablet Take 25 mg by mouth daily.    Historical Provider, MD   BP 144/62 mmHg  Pulse 75  Temp(Src) 98.1 F (36.7 C) (Oral)  Resp 20  SpO2 98% Physical Exam  Constitutional: She is oriented to person, place, and time. She appears well-developed and well-nourished. No distress.  HENT:  Head: Normocephalic and atraumatic.  Eyes: Pupils are equal, round, and reactive to light.  Sclera clear.   Neck: Neck supple. No tracheal deviation present.  Cardiovascular: Normal rate, regular rhythm and intact distal pulses.   Pulmonary/Chest: Effort normal and breath sounds normal. No respiratory distress. She has no wheezes. She has no rales.  Musculoskeletal: Normal range of motion.  Mild Swelling in BLE.  Neurological: She is alert and oriented to person, place, and time. No cranial nerve deficit.  Skin: Skin is warm and dry.  Psychiatric: She has a normal mood and affect. Her behavior is normal.  Nursing note and vitals reviewed.   ED Course  Procedures (including critical care time) DIAGNOSTIC STUDIES: Oxygen Saturation is 98% on RA, normal by my interpretation.    COORDINATION OF CARE: 1:32 AM- Pt advised of plan for treatment which includes radiology and labs and pt agrees.  Labs Review Labs Reviewed  CBC - Abnormal; Notable for the following:    RBC 3.78 (*)    Hemoglobin  11.5 (*)    HCT 35.0 (*)    All other components within normal limits  BASIC METABOLIC PANEL - Abnormal; Notable for the following:    Sodium 134 (*)    Glucose, Bld 120 (*)    GFR calc non Af Amer 86 (*)    All other components within normal limits  BRAIN NATRIURETIC PEPTIDE  TROPONIN I  TROPONIN I  TROPONIN I  I-STAT TROPOININ, ED    Imaging Review Dg Chest 2 View  09/18/2014   CLINICAL DATA:  Chest pain, hypertension  EXAM: CHEST  2 VIEW  COMPARISON:  Chest CT 08/20/2014  FINDINGS: Normal cardiac silhouette. Chronic bronchitic markings. No effusion, infiltrate, pneumothorax. Degenerative osteophytosis of the thoracic spine.  IMPRESSION: Chronic bronchitic markings without acute findings.   Electronically Signed   By: Suzy Bouchard M.D.   On: 09/18/2014 02:18     EKG Interpretation   Date/Time:  Saturday September 18 2014 01:06:21 EST Ventricular Rate:  83 PR Interval:  188 QRS Duration: 94 QT Interval:  386 QTC Calculation: 453 R Axis:   17 Text Interpretation:  Normal sinus rhythm Normal ECG Confirmed by Nadja Lina   MD, Evelynne Spiers ([id number]) on 09/18/2014 1:43:01 AM      MDM   Final diagnoses:  Chest pain   I personally performed the services described in this documentation, which was scribed in my presence. The recorded information has been reviewed and is accurate.  Patient with intermittent recurrent chest pain, multiple cardiac risk factors. Discussed with [facility] hospitalist problem duration and possible stress testing and serial troponins.  Patient has no classic blood clot risk factors.  The patients results and plan were reviewed and discussed.   Any x-rays performed were personally reviewed by myself.   Differential diagnosis were considered with the presenting HPI.  Medications  nitroGLYCERIN (NITROSTAT) SL tablet 0.4 mg (not administered)  hydrochlorothiazide (HYDRODIURIL) tablet 25 mg (not administered)  calcium-vitamin D (OSCAL WITH D) 500-200 MG-UNIT per  tablet 1 tablet (not administered)  acetaminophen (TYLENOL) tablet 650 mg (not administered)    Or  acetaminophen (TYLENOL) suppository 650 mg (not administered)  oxyCODONE (Oxy IR/ROXICODONE) immediate release tablet 5 mg (not administered)  HYDROmorphone (DILAUDID) injection 0.5-1 mg (not administered)  ondansetron (ZOFRAN) tablet 4 mg (not administered)    Or  ondansetron (ZOFRAN) injection 4 mg (not administered)  alum & mag hydroxide-simeth (MAALOX/MYLANTA) 200-200-20 MG/5ML suspension 30 mL (not administered)  enoxaparin (LOVENOX) injection 40 mg (not administered)  sodium chloride 0.9 % injection 3 mL (not administered)  sodium chloride 0.9 % injection 3 mL (not administered)  sodium chloride 0.9 % injection 3 mL (not administered)  0.9 %  sodium chloride infusion (not administered)  nitroGLYCERIN (NITROGLYN) 2 % ointment 0.5 inch (not administered)  aspirin tablet 325 mg (not administered)  pantoprazole (PROTONIX) EC tablet 40 mg (40 mg Oral Not  Given 09/18/14 0500)  aspirin chewable tablet 324 mg (243 mg Oral Given 09/18/14 0332)    Filed Vitals:   09/18/14 0257 09/18/14 0315 09/18/14 0345 09/18/14 0426  BP: 125/57 114/51 121/60 128/51  Pulse: 72 72 72   Temp:    97.8 F (36.6 C)  TempSrc:    Oral  Resp: 16 20 18 18   Height:    5\' 4"  (1.626 m)  Weight:    282 lb 6.6 oz (128.1 kg)  SpO2: 100% 95% 95% 100%    Final diagnoses:  Chest pain    Admission/ observation were discussed with the admitting physician, patient and/or family and they are comfortable with the plan.      Mariea Clonts, MD 09/18/14 [phone]

## 2014-09-18 NOTE — ED Notes (Signed)
Pt. reports central chest pain with mild SOB and occasional dry cough onset this evening , denies nausea or diaphoresis .

## 2014-09-18 NOTE — H&P (Signed)
[facility] Admission History and Physical       KENLEIGH TOBACK GNF:[id number] DOB: 1951/06/11 DOA: 09/18/2014  Referring physician:  PCP: Kerin Perna, NP  Specialists:   Chief Complaint: Chest Pain  HPI: Sheila Jacobs is a 64 y.o. female with a history of CAD, HTN, Hyperlipidemia who presents to the ED with complaints of intermittent Left Sided Chest Pain since 7:30 pm.  She describes the pain as sharp and throbbing and rates the pain at a 10-15 minutes.  She rated the pain at a 4-5/10.   She was referred for medical admission.      Review of Systems:  Constitutional: No Weight Loss, No Weight Gain, Night Sweats, Fevers, Chills, Dizziness, Fatigue, or Generalized Weakness HEENT: No Headaches, Difficulty Swallowing,Tooth/Dental Problems,Sore Throat,  No Sneezing, Rhinitis, Ear Ache, Nasal Congestion, or Post Nasal Drip,  Cardio-vascular:  +Chest pain, Orthopnea, PND, Edema in Lower Extremities, Anasarca, Dizziness, Palpitations  Resp: +Dyspnea, No DOE, No Productive Cough, No Non-Productive Cough, No Hemoptysis, No Wheezing.    GI: No Heartburn, Indigestion, Abdominal Pain, Nausea, Vomiting, Diarrhea, Hematemesis, Hematochezia, Melena, Change in Bowel Habits,  Loss of Appetite  GU: No Dysuria, Change in Color of Urine, No Urgency or Frequency, No Flank pain.  Musculoskeletal: No Joint Pain or Swelling, No Decreased Range of Motion, No Back Pain.  Neurologic: No Syncope, No Seizures, Muscle Weakness, Paresthesia, Vision Disturbance or Loss, No Diplopia, No Vertigo, No Difficulty Walking,  Skin: No Rash or Lesions. Psych: No Change in Mood or Affect, No Depression or Anxiety, No Memory loss, No Confusion, or Hallucinations   Past Medical History  Diagnosis Date  . Hypertension   . CAD (coronary artery disease)   . Hyperlipidemia   . Obesity   . Heavy cigarette smoker       Past Surgical History  Procedure Laterality Date  . Tubal ligation          Prior to Admission medications   Medication Sig Start Date End Date Taking? Authorizing Provider  aspirin 81 MG tablet Take 81 mg by mouth daily.   Yes Historical Provider, MD  calcium citrate-vitamin D (CITRACAL+D) 315-200 MG-UNIT per tablet Take 1 tablet by mouth daily.   Yes Historical Provider, MD  hydrochlorothiazide (HYDRODIURIL) 25 MG tablet Take 1 tablet (25 mg total) by mouth daily. 04/29/12 09/18/14 Yes Billy Fischer, MD  ibuprofen (ADVIL,MOTRIN) 200 MG tablet Take 400 mg by mouth every 6 (six) hours as needed for moderate pain.    Yes Historical Provider, MD  hydrochlorothiazide (HYDRODIURIL) 25 MG tablet Take 25 mg by mouth daily.    Historical Provider, MD      No Known Allergies   Social History:  reports that she quit smoking about 6 weeks ago. Her smoking use included Cigarettes. She has a 45 pack-year smoking history. She does not have any smokeless tobacco history on file. She reports that she does not drink alcohol or use illicit drugs.     Family History  Problem Relation Age of Onset  . Hypertension Mother   . Heart disease Mother     Pacemaker  . Kidney disease Mother        Physical Exam:  GEN:  Pleasant Obese 64 y.o. African American female examined and in no acute distress; cooperative with exam Filed Vitals:   09/18/14 0119 09/18/14 0130 09/18/14 0257 09/18/14 0315  BP: 145/62 138/68 125/57 114/51  Pulse: 76 81 72 72  Temp:      TempSrc:  Resp: 10 18 16 20   SpO2: 99% 100% 100% 95%   Blood pressure 114/51, pulse 72, temperature 98.1 F (36.7 C), temperature source Oral, resp. rate 20, SpO2 95 %. PSYCH: She is alert and oriented x4; does not appear anxious does not appear depressed; affect is normal HEENT: Normocephalic and Atraumatic, Mucous membranes pink; PERRLA; EOM intact; Fundi:  Benign;  No scleral icterus, Nares: Patent, Oropharynx: Clear, Sparse Dentition on Upper Palate,      Neck:  FROM, No Cervical Lymphadenopathy nor Thyromegaly  or Carotid Bruit; No JVD; Breasts:: Not examined CHEST WALL: No tenderness CHEST: Normal respiration, clear to auscultation bilaterally HEART: Regular rate and rhythm; no murmurs rubs or gallops BACK: No kyphosis or scoliosis; No CVA tenderness ABDOMEN: Positive Bowel Sounds, Obese, Soft Non-Tender; No Masses, No Organomegaly, No Pannus; No Intertriginous candida. Rectal Exam: Not done EXTREMITIES: No Cyanosis, Clubbing, or Edema; No Ulcerations. Genitalia: not examined PULSES: 2+ and symmetric SKIN: Normal hydration no rash or ulceration CNS:  Alert and Oriented x 4, No Focal deficits except Hearing Loss Left Ear Vascular: pulses palpable throughout    Labs on Admission:  Basic Metabolic Panel:  Recent Labs Lab 09/18/14 0113  NA 134*  K 3.8  CL 99  CO2 27  GLUCOSE 120*  BUN 9  CREATININE 0.77  CALCIUM 9.7   Liver Function Tests: No results for input(s): AST, ALT, ALKPHOS, BILITOT, PROT, ALBUMIN in the last 168 hours. No results for input(s): LIPASE, AMYLASE in the last 168 hours. No results for input(s): AMMONIA in the last 168 hours. CBC:  Recent Labs Lab 09/18/14 0113  WBC 6.5  HGB 11.5*  HCT 35.0*  MCV 92.6  PLT 262   Cardiac Enzymes: No results for input(s): CKTOTAL, CKMB, CKMBINDEX, TROPONINI in the last 168 hours.  BNP (last 3 results)  Recent Labs  08/20/14 1112  PROBNP 28.2   CBG: No results for input(s): GLUCAP in the last 168 hours.  Radiological Exams on Admission: Dg Chest 2 View  09/18/2014   CLINICAL DATA:  Chest pain, hypertension  EXAM: CHEST  2 VIEW  COMPARISON:  Chest CT 08/20/2014  FINDINGS: Normal cardiac silhouette. Chronic bronchitic markings. No effusion, infiltrate, pneumothorax. Degenerative osteophytosis of the thoracic spine.  IMPRESSION: Chronic bronchitic markings without acute findings.   Electronically Signed   By: Suzy Bouchard M.D.   On: 09/18/2014 02:18     EKG: Independently reviewed. Normal Sinus Rhythm rate 83 No  Acute S-T changes   Assessment/Plan:   64 y.o. female with  Principal Problem:   1.   Atypical chest pain   Telemetry Monitoring   Cycle Troponins   Nitropaste, O2, ASA      Active Problems:   2.   CAD (coronary artery disease)    See #1     3.   Hypertension   Continue HCTZ.   Monitor BPs     4.   Hyperlipidemia   Check Fasting Lipids     5.   DVT Prophylaxis   Lovenox      Code Status:  FULL CODE    Family Communication:  No Family Present    Disposition Plan:       Observation Telemetry Unit  Time spent:  [place] [facility] Pager [phone]   If [place] Please Contact the Day Rounding Team MD for [facility]  If 7PM-7AM, Please Contact Night-Floor Coverage  [email or web address] Password [email or web address] 09/18/2014, 3:32 AM

## 2014-09-18 NOTE — Progress Notes (Signed)
[facility] PROGRESS NOTE  Sheila Jacobs GTX:[id number] DOB: 05-22-1951 DOA: 09/18/2014 PCP: Kerin Perna, NP  Assessment/Plan:  Atypical chest pain -Troponin 1 negative -Echocardiogram; cardiomyopathy   Cardiomyopathy -See results of echocardiogram below -Metoprolol low-dose 12.5 mg BID  CAD (coronary artery disease) -See cardiomyopathy  Hypertension -DC hydrochlorothiazide, not indicated in cardiomyopathy -See cardiomyopathy  Hyperlipidemia -Obtain lipid panel    Code Status: Full Family Communication: None Disposition Plan: DC in a.m. if stable   Consultants: None   Procedures: 1/9 echocardiogram;- LVEF= 60%- 65%. Mild focal basal septal hypertrophy. - Left atrium: severely dilated. - Right atrium:  mildly dilated. - Tricuspid valve:  mild regurgitation.   Cultures NA   Antibiotics: NA   HPI/Subjective: Sheila Jacobs is a 64 y.o. BF PMHx CAD HTN, Hyperlipidemia, tobacco abuse who presents to the ED with complaints of intermittent Left Sided Chest Pain since 7:30 pm. She describes the pain as sharp and throbbing and rates the pain at a 10-15 minutes. She rated the pain at a 4-5/10. She was referred for medical admission.  1/9 currently asymptomatic, rated the CP prior to admission as heavy, positive radiation to left neck, negative N/V, negative diaphoresis, positive dizziness (described as room spinning), lasting approximately 10-15 minutes and resolving spontaneously   Objective: Filed Vitals:   09/18/14 0257 09/18/14 0315 09/18/14 0345 09/18/14 0426  BP: 125/57 114/51 121/60 128/51  Pulse: 72 72 72   Temp:    97.8 F (36.6 C)  TempSrc:    Oral  Resp: 16 20 18 18   Height:    5\' 4"  (1.626 m)  Weight:    128.1 kg (282 lb 6.6 oz)  SpO2: 100% 95% 95% 100%   No intake or output data in the 24 hours ending 09/18/14 0904 Filed Weights   09/18/14 0426  Weight: 128.1 kg (282 lb 6.6 oz)    Exam:   General:  A/O 4,  NAD  Cardiovascular: Regular rhythm and rate, negative murmurs rubs or gallops, normal S1/S2  Respiratory: Clear to auscultation bilateral  Abdomen: Morbidly obese, soft, nontender, plus bowel sound  Musculoskeletal: Negative pedal edema   Data Reviewed: Basic Metabolic Panel:  Recent Labs Lab 09/18/14 0113  NA 134*  K 3.8  CL 99  CO2 27  GLUCOSE 120*  BUN 9  CREATININE 0.77  CALCIUM 9.7   Liver Function Tests: No results for input(s): AST, ALT, ALKPHOS, BILITOT, PROT, ALBUMIN in the last 168 hours. No results for input(s): LIPASE, AMYLASE in the last 168 hours. No results for input(s): AMMONIA in the last 168 hours. CBC:  Recent Labs Lab 09/18/14 0113  WBC 6.5  HGB 11.5*  HCT 35.0*  MCV 92.6  PLT 262   Cardiac Enzymes:  Recent Labs Lab 09/18/14 0333  TROPONINI <0.03   BNP (last 3 results)  Recent Labs  08/20/14 1112  PROBNP 28.2   CBG: No results for input(s): GLUCAP in the last 168 hours.  No results found for this or any previous visit (from the past 240 hour(s)).   Studies: Dg Chest 2 View  09/18/2014   CLINICAL DATA:  Chest pain, hypertension  EXAM: CHEST  2 VIEW  COMPARISON:  Chest CT 08/20/2014  FINDINGS: Normal cardiac silhouette. Chronic bronchitic markings. No effusion, infiltrate, pneumothorax. Degenerative osteophytosis of the thoracic spine.  IMPRESSION: Chronic bronchitic markings without acute findings.   Electronically Signed   By: Suzy Bouchard M.D.   On: 09/18/2014 02:18    Scheduled Meds: . aspirin  325 mg Oral Daily  . calcium-vitamin D  1 tablet Oral Q breakfast  . enoxaparin (LOVENOX) injection  40 mg Subcutaneous Daily  . hydrochlorothiazide  25 mg Oral Daily  . nitroGLYCERIN  0.5 inch Topical 4 times per day  . pantoprazole  40 mg Oral Daily  . sodium chloride  3 mL Intravenous Q12H  . sodium chloride  3 mL Intravenous Q12H   Continuous Infusions:   Principal Problem:   Atypical chest pain Active Problems:    Acute chest pain   CAD (coronary artery disease)   Hypertension   Hyperlipidemia    Time spent: 45 minutes  Sheila Jacobs, J  [facility] Pager [phone]. If 7PM-7AM, please contact night-coverage at [email or web address], password [facility] 09/18/2014, 9:04 AM  LOS: 0 days

## 2014-09-19 DIAGNOSIS — R0789 Other chest pain: Secondary | ICD-10-CM | POA: Diagnosis not present

## 2014-09-19 LAB — CBC
HEMATOCRIT: 33.9 % — AB (ref 36.0–46.0)
Hemoglobin: 11 g/dL — ABNORMAL LOW (ref 12.0–15.0)
MCH: 30.4 pg (ref 26.0–34.0)
MCHC: 32.4 g/dL (ref 30.0–36.0)
MCV: 93.6 fL (ref 78.0–100.0)
Platelets: 239 10*3/uL (ref 150–400)
RBC: 3.62 MIL/uL — ABNORMAL LOW (ref 3.87–5.11)
RDW: 12.7 % (ref 11.5–15.5)
WBC: 4.4 10*3/uL (ref 4.0–10.5)

## 2014-09-19 LAB — COMPREHENSIVE METABOLIC PANEL
ALK PHOS: 65 U/L (ref 39–117)
ALT: 13 U/L (ref 0–35)
AST: 19 U/L (ref 0–37)
Albumin: 2.9 g/dL — ABNORMAL LOW (ref 3.5–5.2)
Anion gap: 6 (ref 5–15)
BUN: 7 mg/dL (ref 6–23)
CHLORIDE: 104 meq/L (ref 96–112)
CO2: 30 mmol/L (ref 19–32)
CREATININE: 0.74 mg/dL (ref 0.50–1.10)
Calcium: 9.4 mg/dL (ref 8.4–10.5)
GFR calc Af Amer: >90 mL/min (ref >90)
GFR calc non Af Amer: 87 mL/min — ABNORMAL LOW (ref >90)
Glucose, Bld: 111 mg/dL — ABNORMAL HIGH (ref 70–99)
POTASSIUM: 3.4 mmol/L — AB (ref 3.5–5.1)
SODIUM: 140 mmol/L (ref 135–145)
TOTAL PROTEIN: 6.4 g/dL (ref 6.0–8.3)
Total Bilirubin: 0.6 mg/dL (ref 0.3–1.2)

## 2014-09-19 LAB — LIPID PANEL
Cholesterol: 151 mg/dL (ref 0–200)
HDL: 49 mg/dL (ref >39)
LDL Cholesterol: 92 mg/dL (ref 0–99)
Total CHOL/HDL Ratio: 3.1 RATIO
Triglycerides: 52 mg/dL (ref <150)
VLDL: 10 mg/dL (ref 0–40)

## 2014-09-19 LAB — MAGNESIUM
MAGNESIUM: 1.9 mg/dL (ref 1.5–2.5)
MAGNESIUM: 2.2 mg/dL (ref 1.5–2.5)

## 2014-09-19 LAB — POTASSIUM: POTASSIUM: 3.9 mmol/L (ref 3.5–5.1)

## 2014-09-19 MED ORDER — POTASSIUM CHLORIDE CRYS ER 20 MEQ PO TBCR
30.0000 meq | EXTENDED_RELEASE_TABLET | Freq: Once | ORAL | Status: AC
  Administered 2014-09-19: 30 meq via ORAL
  Filled 2014-09-19: qty 1

## 2014-09-19 MED ORDER — MAGNESIUM SULFATE 2 GM/50ML IV SOLN
2.0000 g | Freq: Once | INTRAVENOUS | Status: AC
  Administered 2014-09-19: 2 g via INTRAVENOUS
  Filled 2014-09-19: qty 50

## 2014-09-19 MED ORDER — ATORVASTATIN CALCIUM 20 MG PO TABS
20.0000 mg | ORAL_TABLET | Freq: Every day | ORAL | Status: DC
  Administered 2014-09-19: 20 mg via ORAL
  Filled 2014-09-19 (×2): qty 1

## 2014-09-19 NOTE — Progress Notes (Signed)
[facility] PROGRESS NOTE  Sheila Jacobs ZES:[id number] DOB: 05/26/1951 DOA: 09/18/2014 PCP: Kerin Perna, NP  Assessment/Plan:  Atypical chest pain -Troponin 3 negative -Echocardiogram; cardiomyopathy   Congestive dilated Cardiomyopathy -See results of echocardiogram below -Continue Metoprolol low-dose 12.5 mg BID  CAD (coronary artery disease) -See cardiomyopathy  Hypertension -DC hydrochlorothiazide, not indicated in cardiomyopathy -See cardiomyopathy  Hyperlipidemia -lipid panel; LDL mildly elevated -Start Lipitor 20 mg daily  Hypokalemia -K-Dur 27meq x 1 -K goal > 4 -Repeat K and Mg level  Hypomagnesmia -Mg IV 2 gm -Mg goal >2    Code Status: Full Family Communication: None Disposition Plan: DC in a.m. if stable   Consultants: None   Procedures: 1/9 echocardiogram;- LVEF= 60%- 65%. Mild focal basal septal hypertrophy. - Left atrium: severely dilated. - Right atrium:  mildly dilated. - Tricuspid valve:  mild regurgitation.   Cultures NA   Antibiotics: NA   HPI/Subjective: Sheila Jacobs is a 64 y.o. BF PMHx CAD HTN, Hyperlipidemia, tobacco abuse who presents to the ED with complaints of intermittent Left Sided Chest Pain since 7:30 pm. She describes the pain as sharp and throbbing and rates the pain at a 10-15 minutes. She rated the pain at a 4-5/10. She was referred for medical admission.  1/9 currently asymptomatic, rated the CP prior to admission as heavy, positive radiation to left neck, negative N/V, negative diaphoresis, positive dizziness (described as room spinning), lasting approximately 10-15 minutes and resolving spontaneously 1/10 A/O 4, negative CP, negative SOB, negative DOE. Complains of waxing and waning abdominal pain    Objective: Filed Vitals:   09/18/14 2043 09/19/14 0451 09/19/14 0912 09/19/14 1420  BP: 128/49 121/60 119/58 122/59  Pulse: 68 57 76 65  Temp: 98.4 F (36.9 C) 98.5 F (36.9 C)   98.4 F (36.9 C)  TempSrc: Oral Oral  Oral  Resp: 18 22  18   Height:      Weight:      SpO2: 100% 98% 98% 100%    Intake/Output Summary (Last 24 hours) at 09/19/14 1817 Last data filed at 09/18/14 2200  Gross per 24 hour  Intake    363 ml  Output      0 ml  Net    363 ml   Filed Weights   09/18/14 0426  Weight: 128.1 kg (282 lb 6.6 oz)    Exam:   General:  A/O 4, NAD  Cardiovascular: Regular rhythm and rate, negative murmurs rubs or gallops, normal S1/S2  Respiratory: Clear to auscultation bilateral  Abdomen: Morbidly obese, soft, nontender, plus bowel sound. Abdominal pain not reproducible with palpation  Musculoskeletal: Negative pedal edema      Data Reviewed: Basic Metabolic Panel:  Recent Labs Lab 09/18/14 0113 09/19/14 0331  NA 134* 140  K 3.8 3.4*  CL 99 104  CO2 27 30  GLUCOSE 120* 111*  BUN 9 7  CREATININE 0.77 0.74  CALCIUM 9.7 9.4  MG  --  1.9   Liver Function Tests:  Recent Labs Lab 09/19/14 0331  AST 19  ALT 13  ALKPHOS 65  BILITOT 0.6  PROT 6.4  ALBUMIN 2.9*   No results for input(s): LIPASE, AMYLASE in the last 168 hours. No results for input(s): AMMONIA in the last 168 hours. CBC:  Recent Labs Lab 09/18/14 0113 09/19/14 0331  WBC 6.5 4.4  HGB 11.5* 11.0*  HCT 35.0* 33.9*  MCV 92.6 93.6  PLT 262 239   Cardiac Enzymes:  Recent Labs Lab 09/18/14 0333  09/18/14 0924 09/18/14 1535  TROPONINI <0.03 <0.03 <0.03   BNP (last 3 results)  Recent Labs  08/20/14 1112  PROBNP 28.2   CBG: No results for input(s): GLUCAP in the last 168 hours.  No results found for this or any previous visit (from the past 240 hour(s)).   Studies: Dg Chest 2 View  09/18/2014   CLINICAL DATA:  Chest pain, hypertension  EXAM: CHEST  2 VIEW  COMPARISON:  Chest CT 08/20/2014  FINDINGS: Normal cardiac silhouette. Chronic bronchitic markings. No effusion, infiltrate, pneumothorax. Degenerative osteophytosis of the thoracic spine.   IMPRESSION: Chronic bronchitic markings without acute findings.   Electronically Signed   By: Suzy Bouchard M.D.   On: 09/18/2014 02:18    Scheduled Meds: . aspirin  325 mg Oral Daily  . calcium-vitamin D  1 tablet Oral Q breakfast  . enoxaparin (LOVENOX) injection  40 mg Subcutaneous Daily  . metoprolol tartrate  12.5 mg Oral BID  . nitroGLYCERIN  0.5 inch Topical 4 times per day  . pantoprazole  40 mg Oral Daily  . sodium chloride  3 mL Intravenous Q12H  . sodium chloride  3 mL Intravenous Q12H   Continuous Infusions:   Principal Problem:   Atypical chest pain Active Problems:   Acute chest pain   CAD (coronary artery disease)   Hypertension   Hyperlipidemia   Congestive dilated cardiomyopathy    Time spent: 45 minutes  WOODS, CURTIS, J  [facility] Pager [phone]. If 7PM-7AM, please contact night-coverage at [email or web address], password [facility] 09/19/2014, 6:17 PM  LOS: 1 day

## 2014-09-19 NOTE — Progress Notes (Signed)
Utilization Review Completed.   Umaima Scholten, RN, BSN Nurse Case Manager  

## 2014-09-20 ENCOUNTER — Telehealth: Payer: Self-pay | Admitting: Cardiology

## 2014-09-20 DIAGNOSIS — E876 Hypokalemia: Secondary | ICD-10-CM

## 2014-09-20 DIAGNOSIS — R0789 Other chest pain: Secondary | ICD-10-CM | POA: Diagnosis not present

## 2014-09-20 DIAGNOSIS — I2510 Atherosclerotic heart disease of native coronary artery without angina pectoris: Secondary | ICD-10-CM

## 2014-09-20 LAB — BASIC METABOLIC PANEL
Anion gap: 3 — ABNORMAL LOW (ref 5–15)
BUN: 8 mg/dL (ref 6–23)
CO2: 29 mmol/L (ref 19–32)
Calcium: 8.8 mg/dL (ref 8.4–10.5)
Chloride: 107 mEq/L (ref 96–112)
Creatinine, Ser: 0.76 mg/dL (ref 0.50–1.10)
GFR calc non Af Amer: 87 mL/min — ABNORMAL LOW (ref >90)
Glucose, Bld: 88 mg/dL (ref 70–99)
Potassium: 3.7 mmol/L (ref 3.5–5.1)
Sodium: 139 mmol/L (ref 135–145)

## 2014-09-20 LAB — MAGNESIUM: Magnesium: 2.3 mg/dL (ref 1.5–2.5)

## 2014-09-20 MED ORDER — ATORVASTATIN CALCIUM 20 MG PO TABS: 20.0000 mg | ORAL_TABLET | Freq: Every day | ORAL | Status: DC

## 2014-09-20 MED ORDER — METOPROLOL TARTRATE 25 MG PO TABS: 12.5000 mg | ORAL_TABLET | Freq: Two times a day (BID) | ORAL | Status: DC

## 2014-09-20 MED ORDER — OMEPRAZOLE 20 MG PO CPDR: 20.0000 mg | DELAYED_RELEASE_CAPSULE | Freq: Every day | ORAL | Status: DC

## 2014-09-20 NOTE — Progress Notes (Signed)
Nursing note Patient given discharge instructions, AVS, and medication list and follow up appointments. Reviewed with patient information and education done on when to take next dose of medication that was due. All questions were answered. Will discharge home as ordered. Sheniece Ruggles, Bettina Gavia RN

## 2014-09-20 NOTE — Discharge Summary (Signed)
Discharge Summary  Sheila Jacobs MR#: [id number]  DOB:1951/06/26  Date of Admission: 09/18/2014 Date of Discharge: 09/20/2014  Patient's PCP: Kerin Perna, NP  Attending Physician:Zanyah Lentsch A  Consults: None  Discharge Diagnoses: Principal Problem:   Atypical chest pain Active Problems:   Acute chest pain   CAD (coronary artery disease)   Hypertension   Hyperlipidemia   Congestive dilated cardiomyopathy   Hypokalemia   Hypomagnesemia  Brief Admitting History and Physical On admission: "Sheila Jacobs is a 65 y.o. female with a history of CAD, HTN, Hyperlipidemia who presents to the ED with complaints of intermittent Left Sided Chest Pain since 7:30 pm. She describes the pain as sharp and throbbing and rates the pain at a 10-15 minutes. She rated the pain at a 4-5/10. She was referred for medical admission."  Discharge Medications   Medication List    TAKE these medications        aspirin 81 MG tablet  Take 81 mg by mouth daily.     atorvastatin 20 MG tablet  Commonly known as:  LIPITOR  Take 1 tablet (20 mg total) by mouth daily at 6 PM.     calcium citrate-vitamin D 315-200 MG-UNIT per tablet  Commonly known as:  CITRACAL+D  Take 1 tablet by mouth daily.     hydrochlorothiazide 25 MG tablet  Commonly known as:  HYDRODIURIL  Take 1 tablet (25 mg total) by mouth daily.     hydrochlorothiazide 25 MG tablet  Commonly known as:  HYDRODIURIL  Take 25 mg by mouth daily.     ibuprofen 200 MG tablet  Commonly known as:  ADVIL,MOTRIN  Take 400 mg by mouth every 6 (six) hours as needed for moderate pain.     metoprolol tartrate 25 MG tablet  Commonly known as:  LOPRESSOR  Take 0.5 tablets (12.5 mg total) by mouth 2 (two) times daily.     omeprazole 20 MG capsule  Commonly known as:  PRILOSEC  Take 1 capsule (20 mg total) by mouth daily.        Hospital Course: Atypical chest pain Present on Admission:  . Acute chest pain . Atypical chest  pain . CAD (coronary artery disease) . Hypertension . Hyperlipidemia   Atypical chest pain -Troponin 3 negative, patient ruled out for acute coronary syndrome. -Continued on aspirin. -Given atypical chest pain, patient follow-up with cardiology as outpatient for consideration of stress test. -If any further episodes of chest pain, patient has been instructed to seek urgent medical care. -Echocardiogram; cardiomyopathy   Congestive dilated Cardiomyopathy -See results of echocardiogram below -Continue Metoprolol low-dose 12.5 mg BID  CAD (coronary artery disease) -Stable.  Continue aspirin.  Started on metoprolol.  Hypertension -DC hydrochlorothiazide, not indicated in cardiomyopathy -See cardiomyopathy  Hyperlipidemia -lipid panel; LDL mildly elevated -Start Lipitor 20 mg daily  Hypokalemia -Replace as needed.  Hypomagnesmia -Replace as needed.  Physical Exam: General: Awake, Oriented, No acute distress. HEENT: EOMI. Neck: Supple CV: S1 and S2 Lungs: Clear to ascultation bilaterally Abdomen: Soft, Nontender, Nondistended, +bowel sounds. Ext: Good pulses. Trace edema.  Day of Discharge BP 112/57 mmHg  Pulse 55  Temp(Src) 98.2 F (36.8 C) (Oral)  Resp 20  Ht $R'5\' 4"'Ff$  (1.626 m)  Wt 128.1 kg (282 lb 6.6 oz)  BMI 48.45 kg/m2  SpO2 98%  Results for orders placed or performed during the hospital encounter of 09/18/14 (from the past 48 hour(s))  Troponin I (q 6hr x 3)     Status: None  Collection Time: 09/18/14  9:24 AM  Result Value Ref Range   Troponin I <0.03 <0.031 ng/mL    Comment:        NO INDICATION OF MYOCARDIAL INJURY. Please note change in reference range.   TSH     Status: None   Collection Time: 09/18/14 12:50 PM  Result Value Ref Range   TSH 0.543 0.350 - 4.500 uIU/mL  Troponin I (q 6hr x 3)     Status: None   Collection Time: 09/18/14  3:35 PM  Result Value Ref Range   Troponin I <0.03 <0.031 ng/mL    Comment:        NO INDICATION  OF MYOCARDIAL INJURY. Please note change in reference range.   CBC     Status: Abnormal   Collection Time: 09/19/14  3:31 AM  Result Value Ref Range   WBC 4.4 4.0 - 10.5 K/uL   RBC 3.62 (L) 3.87 - 5.11 MIL/uL   Hemoglobin 11.0 (L) 12.0 - 15.0 g/dL   HCT 53.7 (L) 37.3 - 54.6 %   MCV 93.6 78.0 - 100.0 fL   MCH 30.4 26.0 - 34.0 pg   MCHC 32.4 30.0 - 36.0 g/dL   RDW 14.9 93.1 - 93.6 %   Platelets 239 150 - 400 K/uL  Comprehensive metabolic panel     Status: Abnormal   Collection Time: 09/19/14  3:31 AM  Result Value Ref Range   Sodium 140 135 - 145 mmol/L    Comment: Please note change in reference range.   Potassium 3.4 (L) 3.5 - 5.1 mmol/L    Comment: Please note change in reference range.   Chloride 104 96 - 112 mEq/L   CO2 30 19 - 32 mmol/L   Glucose, Bld 111 (H) 70 - 99 mg/dL   BUN 7 6 - 23 mg/dL   Creatinine, Ser 5.83 0.50 - 1.10 mg/dL   Calcium 9.4 8.4 - 64.9 mg/dL   Total Protein 6.4 6.0 - 8.3 g/dL   Albumin 2.9 (L) 3.5 - 5.2 g/dL   AST 19 0 - 37 U/L   ALT 13 0 - 35 U/L   Alkaline Phosphatase 65 39 - 117 U/L   Total Bilirubin 0.6 0.3 - 1.2 mg/dL   GFR calc non Af Amer 87 (L) >90 mL/min   GFR calc Af Amer >90 >90 mL/min    Comment: (NOTE) The eGFR has been calculated using the CKD EPI equation. This calculation has not been validated in all clinical situations. eGFR's persistently <90 mL/min signify possible Chronic Kidney Disease.    Anion gap 6 5 - 15  Magnesium     Status: None   Collection Time: 09/19/14  3:31 AM  Result Value Ref Range   Magnesium 1.9 1.5 - 2.5 mg/dL  Lipid panel     Status: None   Collection Time: 09/19/14  3:31 AM  Result Value Ref Range   Cholesterol 151 0 - 200 mg/dL   Triglycerides 52 <402 mg/dL   HDL 49 >70 mg/dL   Total CHOL/HDL Ratio 3.1 RATIO   VLDL 10 0 - 40 mg/dL   LDL Cholesterol 92 0 - 99 mg/dL    Comment:        Total Cholesterol/HDL:CHD Risk Coronary Heart Disease Risk Table                     Men   Women  1/2  Average Risk   3.4   3.3  Average Risk  5.0   4.4  2 X Average Risk   9.6   7.1  3 X Average Risk  23.4   11.0        Use the calculated Patient Ratio above and the CHD Risk Table to determine the patient's CHD Risk.        ATP III CLASSIFICATION (LDL):  <100     mg/dL   Optimal  100-129  mg/dL   Near or Above                    Optimal  130-159  mg/dL   Borderline  160-189  mg/dL   High  >190     mg/dL   Very High   Potassium     Status: None   Collection Time: 09/19/14  7:12 PM  Result Value Ref Range   Potassium 3.9 3.5 - 5.1 mmol/L    Comment: Please note change in reference range.  Magnesium     Status: None   Collection Time: 09/19/14  7:12 PM  Result Value Ref Range   Magnesium 2.2 1.5 - 2.5 mg/dL  Basic metabolic panel     Status: Abnormal   Collection Time: 09/20/14  2:51 AM  Result Value Ref Range   Sodium 139 135 - 145 mmol/L    Comment: Please note change in reference range.   Potassium 3.7 3.5 - 5.1 mmol/L    Comment: Please note change in reference range.   Chloride 107 96 - 112 mEq/L   CO2 29 19 - 32 mmol/L   Glucose, Bld 88 70 - 99 mg/dL   BUN 8 6 - 23 mg/dL   Creatinine, Ser 0.76 0.50 - 1.10 mg/dL   Calcium 8.8 8.4 - 10.5 mg/dL   GFR calc non Af Amer 87 (L) >90 mL/min   GFR calc Af Amer >90 >90 mL/min    Comment: (NOTE) The eGFR has been calculated using the CKD EPI equation. This calculation has not been validated in all clinical situations. eGFR's persistently <90 mL/min signify possible Chronic Kidney Disease.    Anion gap 3 (L) 5 - 15  Magnesium     Status: None   Collection Time: 09/20/14  2:51 AM  Result Value Ref Range   Magnesium 2.3 1.5 - 2.5 mg/dL    Dg Chest 2 View  09/18/2014   CLINICAL DATA:  Chest pain, hypertension  EXAM: CHEST  2 VIEW  COMPARISON:  Chest CT 08/20/2014  FINDINGS: Normal cardiac silhouette. Chronic bronchitic markings. No effusion, infiltrate, pneumothorax. Degenerative osteophytosis of the thoracic spine.   IMPRESSION: Chronic bronchitic markings without acute findings.   Electronically Signed   By: Suzy Bouchard M.D.   On: 09/18/2014 02:18   2-D echocardiogram on 09/18/2014 Study Conclusions  - Left ventricle: The cavity size was normal. Systolic function was normal. The estimated ejection fraction was in the range of 60% to 65%. Wall motion was normal; there were no regional wall motion abnormalities. Indeterminate diastolic function. Normal filling pressures. Mild focal basal septal hypertrophy. - Aortic valve: Trileaflet; mildly thickened leaflets. - Mitral valve: There was mild regurgitation. - Left atrium: The atrium was severely dilated. Volume/bsa, S: 42.3 ml/m^2. - Right atrium: The atrium was mildly dilated. - Tricuspid valve: There was mild regurgitation. - Inferior vena cava: The vessel was small, appearing collapsed, consistent with low central venous pressure.  Disposition: Home  Diet: Heart healthy diet  Activity: Resume as tolerated.   Follow-up Appts:  Discharge Instructions    Diet - low sodium heart healthy    Complete by:  As directed      Discharge instructions    Complete by:  As directed   -Please follow-up with your primary care physician in one week. -Please follow-up with your cardiologist in 1-2 weeks after discharge, if cardiology's office does not contact you into business days, please contact their office to arrange for a posthospitalization appointment. -If you have any further episodes of chest pain or changes in your health, please seek urgent medical care.     Increase activity slowly    Complete by:  As directed            TESTS THAT NEED FOLLOW-UP None  Time spent on discharge, talking to the patient, and coordinating care: 25 mins.   Signed: Bynum Bellows, MD 09/20/2014, 9:08 AM

## 2014-09-20 NOTE — Telephone Encounter (Signed)
Closed encounter °

## 2014-09-21 ENCOUNTER — Telehealth (HOSPITAL_COMMUNITY): Payer: Self-pay | Admitting: *Deleted

## 2014-10-06 ENCOUNTER — Encounter: Payer: Self-pay | Admitting: Physician Assistant

## 2014-10-06 ENCOUNTER — Ambulatory Visit (INDEPENDENT_AMBULATORY_CARE_PROVIDER_SITE_OTHER): Payer: Medicare HMO | Admitting: Physician Assistant

## 2014-10-06 VITALS — BP 122/74 | HR 70 | Ht 64.0 in | Wt 277.6 lb

## 2014-10-06 DIAGNOSIS — R0989 Other specified symptoms and signs involving the circulatory and respiratory systems: Secondary | ICD-10-CM

## 2014-10-06 DIAGNOSIS — R072 Precordial pain: Secondary | ICD-10-CM

## 2014-10-06 DIAGNOSIS — R079 Chest pain, unspecified: Secondary | ICD-10-CM

## 2014-10-06 NOTE — Progress Notes (Signed)
Cardiology Office Note   Date:  10/06/2014   ID:  Sheila Jacobs, DOB [birth year], MRN [id number]  PCP:  Kerin Perna, NP  Cardiologist:  Dr Mardene Celeste, PA-C   Chief Complaint - Precordial Chest Pain    History of Present Illness: Sheila Jacobs is a 64 y.o. female who presents for evaluation of chest pain.   She has a history of chest pain, seen by [facility] 08/2014 and POET recommended but this was never performed.   She was hospitalized 01/09-01/11 for chest pain that started after eating chicken wings. An echo was largely normal (results below) and enzymes were negative. Outpatient evaluation recommended.  She quit taking the metoprolol because of side effects.  Sheila Jacobs has continued to have intermittent episodes of chest pain since then. They are brief, no radiation, no associated symptoms. No association with exertion, possible association with spicy or fatty foods.  She also has consistent DOE but states she can walk well on flat ground (she got SOB getting up onto the exam table). She has chronic LE edema, that is worse when her legs hang down (frequently). She denies PND or orthopnea. Her grandchildren say she snores.   A major concern for her is sequelae of a medication or procedure (she says steroid injection), that includes skin eruption on both hands and hair problems (thinning and won't grow over an inch).  Says she has gained weight and feels that is a problem, but her weight is below previous visits.  She says she has blockages and points to her legs but cannot remember any evaluation.  Past Medical History  Diagnosis Date  . Hypertension   . Enlarged LA (left atrium)   . Hyperlipidemia   . Obesity   . Heavy cigarette smoker     Quit 2015  . Morbid obesity     Past Surgical History  Procedure Laterality Date  . Tubal ligation     ECHO 09/28/2014 - Left ventricle: The cavity size was normal. Systolic function was normal. The  estimated ejection fraction was in the range of 60% to 65%. Wall motion was normal; there were no regioNal wall motion abnormalities. Indeterminate diastolic function. Normal filling pressures. Mild focal basal septal hypertrophy. - Aortic valve: Trileaflet; mildly thickened leaflets. - Mitral valve: There was mild regurgitation. - Left atrium: The atrium was severely dilated. Volume/bsa, S: 42.71ml/m^2. - Right atrium: The atrium was mildly dilated. - Tricuspid valve: There was mild regurgitation. - Inferior vena cava: The vessel was small, appearing collapsed,consistent with low central venous pressure.  05/27/2003 Adenosine Stress Test IMPRESSION 1. FIXED DEFECTS IN THE INFEROLATERAL WALL PROBABLY RELATED TO DIAPHRAGMATIC ATTENUATION ALTHOUGH SCAR CANNOT BE COMPLETELY EXCLUDED. 2. THERE IS A REVERSIBLE DEFECT SEEN IN THE LATERAL WALL EXTENDING FROM THE BASE TO THE MID SEGMENT. THE PATIENT HAS LARGE BREAST SIZE AND THERE IS EVIDENCE OF ATTENUATION ARTIFACT IN THIS REGION AND WHILE THIS MAY BE RELATED TO DIFFERENTIAL BREAST POSITIONING BETWEEN THE TWO SETS OF IMAGES, ISCHEMIA AT THIS LOCATION CANNOT BE COMPLETELY EXCLUDED. 3. LEFT VENTRICULAR EJECTION FRACTION IS 59%.   Current Outpatient Prescriptions  Medication Sig Dispense Refill  . aspirin 81 MG tablet Take 81 mg by mouth daily.    Marland Kitchen atorvastatin (LIPITOR) 20 MG tablet Take 1 tablet (20 mg total) by mouth daily at 6 PM. 30 tablet 0  . calcium citrate-vitamin D (CITRACAL+D) 315-200 MG-UNIT per tablet Take 1 tablet by mouth daily.    . hydrochlorothiazide (HYDRODIURIL)  25 MG tablet Take 25 mg by mouth daily.    Marland Kitchen ibuprofen (ADVIL,MOTRIN) 200 MG tablet Take 400 mg by mouth every 6 (six) hours as needed for moderate pain.     Marland Kitchen omeprazole (PRILOSEC) 20 MG capsule Take 1 capsule (20 mg total) by mouth daily. 30 capsule 0   No current facility-administered medications for this visit.    Allergies:   Cortisone   Social History:   The patient  reports that she quit smoking about 2 months ago. Her smoking use included Cigarettes. She has a 45 pack-year smoking history. She does not have any smokeless tobacco history on file. She reports that she does not drink alcohol or use illicit drugs.   Family History:  The patient's family history includes Heart disease in her mother; Hypertension in her mother; Kidney disease in her mother.    ROS:  Please see the history of present illness.   Otherwise, review of systems are positive for none.   All other systems are reviewed and negative.    PHYSICAL EXAM: VS:  BP 122/74 mmHg  Pulse 70  Ht 5\' 4"  (1.626 m)  Wt 277 lb 9.6 oz (125.919 kg)  BMI 47.63 kg/m2 , BMI Body mass index is 47.63 kg/(m^2). GEN: Well nourished, obese female, in no acute distress HEENT: normal Neck: no JVD, right carotid bruit noted, enlargement to right neck, not tender or fluctuant Cardiac: RRR; no murmurs, rubs, or gallops, 1+ LE edema, distal pulses 2+ 4/4 extremities Respiratory:  clear to auscultation bilaterally, normal work of breathing GI: soft, nontender, nondistended, + BS, no HSM Sheila: no deformity or atrophy Skin: warm and dry, no rash Neuro:  Strength and sensation are intact Psych: euthymic mood, full affect  EKG:  EKG is ordered today. The ekg ordered today demonstrates SR, with borderline 1st degree AV block. No Q waves or ischemic changes  Recent Labs: 08/20/2014: Pro B Natriuretic peptide (BNP) 28.2 09/18/2014: B Natriuretic Peptide 34.5; TSH 0.543 09/19/2014: ALT 13; Hemoglobin 11.0*; Platelets 239 09/20/2014: BUN 8; Creatinine 0.76; Magnesium 2.3; Potassium 3.7; Sodium 139    Lipid Panel    Component Value Date/Time   CHOL 151 09/19/2014 0331   TRIG 52 09/19/2014 0331   HDL 49 09/19/2014 0331   CHOLHDL 3.1 09/19/2014 0331   VLDL 10 09/19/2014 0331   LDLCALC 92 09/19/2014 0331     Wt Readings from Last 3 Encounters:  10/06/14 277 lb 9.6 oz (125.919 kg)  09/18/14 282 lb 6.6  oz (128.1 kg)  09/02/14 281 lb 11.2 oz (127.778 kg)    Dg Chest 2 View  09/18/2014   CLINICAL DATA:  Chest pain, hypertension  EXAM: CHEST  2 VIEW  COMPARISON:  Chest CT 08/20/2014  FINDINGS: Normal cardiac silhouette. Chronic bronchitic markings. No effusion, infiltrate, pneumothorax. Degenerative osteophytosis of the thoracic spine.  IMPRESSION: Chronic bronchitic markings without acute findings.   Electronically Signed   By: Suzy Bouchard M.D.   On: 09/18/2014 02:18    Other studies Reviewed: Additional studies/ records that were reviewed today include: Previous nuclear stress test 2004. Review of the above records demonstrates: No clear ischemia  ASSESSMENT AND PLAN: Patient Problem List   Diagnosis Date Noted  . Right carotid bruit - Carotid dopplers ordered, f/u on results 10/06/2014  . Precordial chest pain - Lexiscan MV, 2 day study. Discussed need for stress test with patient, she agrees 09/18/2014  . Hypertension - currently well-controlled, continue current meds 09/18/2014  . Hyperlipidemia - Continue  statin, recent lipid/liver were good 09/18/2014  . Enlarged LA (left atrium) - unclear cause, no hx arrhythmia and mild MR on echo, follow     Current medicines are reviewed at length with the patient today.  The patient does not have concerns regarding medicines.  The following changes have been made:  no change  Labs/ tests ordered today include: Adenosine MV, Carotid Dopplers  Orders Placed This Encounter  Procedures  . Myocardial Perfusion Imaging  . EKG 12-Lead  . Doppler carotid     Disposition:   FU with Dr. [name] in 3 months   Signed, Rosaria Ferries, PA-C  10/06/2014 10:57 AM    [address], [place], [place]  [address] Phone: [phone]; Fax: [phone]

## 2014-10-06 NOTE — Patient Instructions (Signed)
Tarri Fuller, PA-C, has ordered the following test(s) to be done: 1. 2-Day [place]- For further information please visit [email or web address]. Please follow instruction sheet, as given. 2. Carotid doppler - Your physician has requested that you have a carotid duplex. This test is an ultrasound of the carotid arteries in your neck. It looks at blood flow through these arteries that supply the brain with blood. Allow one hour for this exam. There are no restrictions or special instructions.  Tarri Fuller, PA-C, wants you to follow-up in 3 months with Dr [name]. You will receive a reminder letter in the mail one months in advance. If you don't receive a letter, please call our office to schedule the follow-up appointment.

## 2014-10-08 ENCOUNTER — Telehealth (HOSPITAL_COMMUNITY): Payer: Self-pay

## 2014-10-08 ENCOUNTER — Ambulatory Visit (HOSPITAL_COMMUNITY)
Admission: RE | Admit: 2014-10-08 | Discharge: 2014-10-08 | Disposition: A | Discharge status: Home / Self Care | Payer: Medicare HMO | Source: Ambulatory Visit | Attending: Physician Assistant | Admitting: Physician Assistant

## 2014-10-08 DIAGNOSIS — R0989 Other specified symptoms and signs involving the circulatory and respiratory systems: Secondary | ICD-10-CM

## 2014-10-08 DIAGNOSIS — I6523 Occlusion and stenosis of bilateral carotid arteries: Secondary | ICD-10-CM | POA: Diagnosis not present

## 2014-10-08 DIAGNOSIS — R079 Chest pain, unspecified: Secondary | ICD-10-CM | POA: Insufficient documentation

## 2014-10-08 NOTE — Progress Notes (Signed)
Carotid Duplex Completed. There is no evidence of stenosis noted in bilateral ICAs. Oda Cogan, BS, RDMS, RVT

## 2014-10-08 NOTE — Telephone Encounter (Signed)
Encounter complete. 

## 2014-10-12 ENCOUNTER — Telehealth (HOSPITAL_COMMUNITY): Payer: Self-pay

## 2014-10-12 NOTE — Telephone Encounter (Signed)
Encounter complete. 

## 2014-10-13 ENCOUNTER — Telehealth: Payer: Self-pay | Admitting: Physician Assistant

## 2014-10-13 DIAGNOSIS — R072 Precordial pain: Secondary | ICD-10-CM

## 2014-10-13 DIAGNOSIS — I10 Essential (primary) hypertension: Secondary | ICD-10-CM | POA: Diagnosis not present

## 2014-10-13 DIAGNOSIS — E785 Hyperlipidemia, unspecified: Secondary | ICD-10-CM | POA: Diagnosis not present

## 2014-10-13 DIAGNOSIS — Z8249 Family history of ischemic heart disease and other diseases of the circulatory system: Secondary | ICD-10-CM | POA: Diagnosis not present

## 2014-10-13 DIAGNOSIS — R42 Dizziness and giddiness: Secondary | ICD-10-CM | POA: Diagnosis not present

## 2014-10-13 DIAGNOSIS — R55 Syncope and collapse: Secondary | ICD-10-CM | POA: Diagnosis not present

## 2014-10-13 DIAGNOSIS — R079 Chest pain, unspecified: Secondary | ICD-10-CM | POA: Diagnosis present

## 2014-10-13 DIAGNOSIS — E669 Obesity, unspecified: Secondary | ICD-10-CM | POA: Diagnosis not present

## 2014-10-13 DIAGNOSIS — Z87891 Personal history of nicotine dependence: Secondary | ICD-10-CM | POA: Diagnosis not present

## 2014-10-13 DIAGNOSIS — R002 Palpitations: Secondary | ICD-10-CM | POA: Diagnosis not present

## 2014-10-13 NOTE — Procedures (Addendum)
[facility] [address] [place] [address] [phone]  Cardiology Nuclear Med Study  Sheila Jacobs is a 64 y.o. female     MRN : [id number]     DOB: Aug 27, 1951  Procedure Date: 10/13/2014  Nuclear Med Background Indication for Stress Test:  Evaluation for Ischemia and Post Hospital History:  Right Carotid [place];Enlarged Left Atrium;Last NUC MPI on 05/26/2003-scar;EF=59% Cardiac Risk Factors: Carotid Disease, Family History - CAD, History of Smoking, Hypertension, Lipids and Obesity  Symptoms:  Chest Pain, Dizziness, DOE, Light-Headedness, Near Syncope and Palpitations   Nuclear Pre-Procedure Caffeine/Decaff Intake:  12:00am NPO After: 10am   IV Site: R Forearm  IV 0.9% NS with Angio Cath:  22g  Chest Size (in):  n/a IV Started by: Rolene Course, RN  Height: 5\' 4"  (1.626 m)  Cup Size: DD  BMI:  Body mass index is 47.52 kg/(m^2). Weight:  277 lb (125.646 kg)   Tech Comments:  n/a    Nuclear Med Study 1 or 2 day study: 2 day  Stress Test Type:  [place] Provider:  Minus Breeding, MD   Resting Radionuclide: Technetium 60m Sestamibi  Resting Radionuclide Dose: 31.7 mCi   Stress Radionuclide:  Technetium 84m Sestamibi  Stress Radionuclide Dose: 32.4 mCi           Stress Protocol Rest HR: 65 Stress HR: 96  Rest BP: 117/62 Stress BP: 148/65  Exercise Time (min): n/a METS: n/a          Dose of Adenosine (mg):  n/a Dose of Lexiscan: 0.4 mg  Dose of Atropine (mg): n/a Dose of Dobutamine: n/a mcg/kg/min (at max HR)  Stress Test Technologist: Mellody Memos, CCT Nuclear Technologist: Imagene Riches, CNMT   Rest Procedure:  Myocardial perfusion imaging was performed at rest 45 minutes following the intravenous administration of Technetium 64m Sestamibi. Stress Procedure:  The patient received IV Lexiscan 0.4 mg over 15-seconds.  Technetium 69m Sestamibi injected IV at 30-seconds.  Patient experienced  shortness of breath, stomach pains, head pains and was administered 125 mg of Aminophylline IV at 5 minutes. There were no significant changes with Lexiscan.  Quantitative spect images were obtained after a 45 minute delay.  Transient Ischemic Dilatation (Normal <1.22):  1.15  QGS EDV:  115 ml QGS ESV:  40 ml LV Ejection Fraction: 66%    Rest ECG: NSR - Normal EKG  Stress ECG: No significant ST segment change suggestive of ischemia.  QPS Raw Data Images:  Acquisition technically good; normal left ventricular size. Stress Images:  There is decreased uptake in the inferior wall. Rest Images:  Normal homogeneous uptake in all areas of the myocardium. Subtraction (SDS):  These findings are consistent with ischemia.  Impression Exercise Capacity:  Lexiscan with no exercise. BP Response:  Normal blood pressure response. Clinical Symptoms:  There is dyspnea. ECG Impression:  No significant ST segment change suggestive of ischemia. Comparison with Prior Nuclear Study: No images to compare  Overall Impression:  High risk stress nuclear study with a large, severe, reversible inferior defect consistent with severe inferior ischemia.  LV Wall Motion:  NL LV Function; NL Wall Motion   [name], MD  10/14/2014 4:36 PM

## 2014-10-13 NOTE — Telephone Encounter (Signed)
I left a message with carotid doppler results on the patient's voicemail.  Tarri Fuller PAC

## 2014-10-14 ENCOUNTER — Ambulatory Visit (HOSPITAL_COMMUNITY)
Admission: RE | Admit: 2014-10-14 | Discharge: 2014-10-14 | Disposition: A | Discharge status: Home / Self Care | Payer: Medicare HMO | Source: Ambulatory Visit | Attending: Cardiovascular Disease | Admitting: Cardiovascular Disease

## 2014-10-14 ENCOUNTER — Ambulatory Visit (HOSPITAL_BASED_OUTPATIENT_CLINIC_OR_DEPARTMENT_OTHER)
Admission: RE | Admit: 2014-10-14 | Discharge: 2014-10-14 | Disposition: A | Discharge status: Home / Self Care | Payer: Medicare HMO | Source: Ambulatory Visit | Attending: Internal Medicine | Admitting: Internal Medicine

## 2014-10-14 DIAGNOSIS — R002 Palpitations: Secondary | ICD-10-CM | POA: Insufficient documentation

## 2014-10-14 DIAGNOSIS — R42 Dizziness and giddiness: Secondary | ICD-10-CM | POA: Insufficient documentation

## 2014-10-14 DIAGNOSIS — R079 Chest pain, unspecified: Secondary | ICD-10-CM | POA: Insufficient documentation

## 2014-10-14 DIAGNOSIS — Z87891 Personal history of nicotine dependence: Secondary | ICD-10-CM | POA: Insufficient documentation

## 2014-10-14 DIAGNOSIS — Z8249 Family history of ischemic heart disease and other diseases of the circulatory system: Secondary | ICD-10-CM | POA: Insufficient documentation

## 2014-10-14 DIAGNOSIS — E669 Obesity, unspecified: Secondary | ICD-10-CM | POA: Insufficient documentation

## 2014-10-14 DIAGNOSIS — I10 Essential (primary) hypertension: Secondary | ICD-10-CM | POA: Insufficient documentation

## 2014-10-14 DIAGNOSIS — E785 Hyperlipidemia, unspecified: Secondary | ICD-10-CM | POA: Insufficient documentation

## 2014-10-14 DIAGNOSIS — R55 Syncope and collapse: Secondary | ICD-10-CM | POA: Insufficient documentation

## 2014-10-14 DIAGNOSIS — R072 Precordial pain: Secondary | ICD-10-CM

## 2014-10-14 MED ORDER — TECHNETIUM TC 99M SESTAMIBI GENERIC - CARDIOLITE
31.7000 | Freq: Once | INTRAVENOUS | Status: AC | PRN
  Administered 2014-10-14: 31.7 via INTRAVENOUS

## 2014-10-14 MED ORDER — TECHNETIUM TC 99M SESTAMIBI GENERIC - CARDIOLITE: 30.0000 | Freq: Once | INTRAVENOUS | Status: AC | PRN

## 2014-10-14 MED ORDER — TECHNETIUM TC 99M SESTAMIBI GENERIC - CARDIOLITE: 32.4000 | Freq: Once | INTRAVENOUS | Status: AC | PRN

## 2014-10-14 MED ORDER — AMINOPHYLLINE 25 MG/ML IV SOLN
125.0000 mg | Freq: Once | INTRAVENOUS | Status: AC
  Administered 2014-10-13: 125 mg via INTRAVENOUS

## 2014-10-14 MED ORDER — REGADENOSON 0.4 MG/5ML IV SOLN
0.4000 mg | Freq: Once | INTRAVENOUS | Status: AC
  Administered 2014-10-13: 0.4 mg via INTRAVENOUS

## 2014-10-15 ENCOUNTER — Encounter: Payer: Self-pay | Admitting: Cardiology

## 2014-10-15 ENCOUNTER — Other Ambulatory Visit: Payer: Self-pay | Admitting: *Deleted

## 2014-10-15 DIAGNOSIS — Z01812 Encounter for preprocedural laboratory examination: Secondary | ICD-10-CM

## 2014-10-15 DIAGNOSIS — D689 Coagulation defect, unspecified: Secondary | ICD-10-CM

## 2014-10-15 DIAGNOSIS — R9439 Abnormal result of other cardiovascular function study: Secondary | ICD-10-CM

## 2014-10-18 ENCOUNTER — Telehealth: Payer: Self-pay | Admitting: Cardiology

## 2014-10-18 NOTE — Telephone Encounter (Signed)
Called patient twice to let her know of her upcoming cath on 10-21-14 at 12 noon.  She needs to have her blood work and x-ray completed.  If she calls back, forward her to my phone [phone] ext.[phone].

## 2014-10-18 NOTE — Telephone Encounter (Signed)
Returning somebody call,did not know who it was.

## 2014-10-18 NOTE — Telephone Encounter (Signed)
Pt had the wrong person,it was concerning her Cath,

## 2014-10-19 LAB — CBC
HCT: 38.3 % (ref 36.0–46.0)
Hemoglobin: 12.7 g/dL (ref 12.0–15.0)
MCH: 31.4 pg (ref 26.0–34.0)
MCHC: 33.2 g/dL (ref 30.0–36.0)
MCV: 94.6 fL (ref 78.0–100.0)
MPV: 9.8 fL (ref 8.6–12.4)
Platelets: 241 10*3/uL (ref 150–400)
RBC: 4.05 MIL/uL (ref 3.87–5.11)
RDW: 13.6 % (ref 11.5–15.5)
WBC: 4.9 10*3/uL (ref 4.0–10.5)

## 2014-10-20 LAB — COMPREHENSIVE METABOLIC PANEL
ALK PHOS: 79 U/L (ref 39–117)
ALT: 15 U/L (ref 0–35)
AST: 18 U/L (ref 0–37)
Albumin: 4 g/dL (ref 3.5–5.2)
BUN: 10 mg/dL (ref 6–23)
CO2: 30 mEq/L (ref 19–32)
CREATININE: 0.77 mg/dL (ref 0.50–1.10)
Calcium: 9.6 mg/dL (ref 8.4–10.5)
Chloride: 103 mEq/L (ref 96–112)
Glucose, Bld: 111 mg/dL — ABNORMAL HIGH (ref 70–99)
Potassium: 3.7 mEq/L (ref 3.5–5.3)
Sodium: 140 mEq/L (ref 135–145)
Total Bilirubin: 0.5 mg/dL (ref 0.2–1.2)
Total Protein: 7.7 g/dL (ref 6.0–8.3)

## 2014-10-20 LAB — APTT: aPTT: 29 seconds (ref 24–37)

## 2014-10-20 LAB — TSH: TSH: 0.398 u[IU]/mL (ref 0.350–4.500)

## 2014-10-20 LAB — PROTIME-INR
INR: 0.95 (ref <1.50)
PROTHROMBIN TIME: 12.7 s (ref 11.6–15.2)

## 2014-10-21 ENCOUNTER — Encounter (HOSPITAL_COMMUNITY)
Admission: RE | Disposition: A | Discharge status: Home / Self Care | Payer: Self-pay | Source: Ambulatory Visit | Attending: Cardiovascular Disease

## 2014-10-21 ENCOUNTER — Encounter (HOSPITAL_COMMUNITY): Payer: Self-pay | Admitting: Cardiovascular Disease

## 2014-10-21 ENCOUNTER — Ambulatory Visit (HOSPITAL_COMMUNITY)
Admission: RE | Admit: 2014-10-21 | Discharge: 2014-10-21 | Disposition: A | Discharge status: Home / Self Care | Payer: Medicare HMO | Source: Ambulatory Visit | Attending: Cardiovascular Disease | Admitting: Cardiovascular Disease

## 2014-10-21 DIAGNOSIS — Z87891 Personal history of nicotine dependence: Secondary | ICD-10-CM | POA: Diagnosis not present

## 2014-10-21 DIAGNOSIS — Z8249 Family history of ischemic heart disease and other diseases of the circulatory system: Secondary | ICD-10-CM | POA: Diagnosis not present

## 2014-10-21 DIAGNOSIS — E785 Hyperlipidemia, unspecified: Secondary | ICD-10-CM | POA: Diagnosis not present

## 2014-10-21 DIAGNOSIS — Z7982 Long term (current) use of aspirin: Secondary | ICD-10-CM | POA: Diagnosis not present

## 2014-10-21 DIAGNOSIS — I10 Essential (primary) hypertension: Secondary | ICD-10-CM | POA: Diagnosis not present

## 2014-10-21 DIAGNOSIS — R0789 Other chest pain: Secondary | ICD-10-CM | POA: Diagnosis present

## 2014-10-21 DIAGNOSIS — Z6841 Body Mass Index (BMI) 40.0 and over, adult: Secondary | ICD-10-CM | POA: Insufficient documentation

## 2014-10-21 DIAGNOSIS — R079 Chest pain, unspecified: Secondary | ICD-10-CM

## 2014-10-21 HISTORY — PX: LEFT HEART CATHETERIZATION WITH CORONARY ANGIOGRAM: SHX5451

## 2014-10-21 SURGERY — LEFT HEART CATHETERIZATION WITH CORONARY ANGIOGRAM
Anesthesia: LOCAL

## 2014-10-21 MED ORDER — SODIUM CHLORIDE 0.9 % IV SOLN
INTRAVENOUS | Status: DC
  Administered 2014-10-21: 13:00:00 via INTRAVENOUS

## 2014-10-21 MED ORDER — FENTANYL CITRATE 0.05 MG/ML IJ SOLN
INTRAMUSCULAR | Status: AC
  Filled 2014-10-21: qty 2

## 2014-10-21 MED ORDER — VERAPAMIL HCL 2.5 MG/ML IV SOLN
INTRAVENOUS | Status: AC
  Filled 2014-10-21: qty 2

## 2014-10-21 MED ORDER — SODIUM CHLORIDE 0.9 % IJ SOLN: 3.0000 mL | INTRAMUSCULAR | Status: DC | PRN

## 2014-10-21 MED ORDER — ASPIRIN 81 MG PO CHEW
CHEWABLE_TABLET | ORAL | Status: AC
  Administered 2014-10-21: 81 mg
  Filled 2014-10-21: qty 1

## 2014-10-21 MED ORDER — HEPARIN (PORCINE) IN NACL 2-0.9 UNIT/ML-% IJ SOLN
INTRAMUSCULAR | Status: AC
  Filled 2014-10-21: qty 1000

## 2014-10-21 MED ORDER — NITROGLYCERIN 1 MG/10 ML FOR IR/CATH LAB
INTRA_ARTERIAL | Status: AC
  Filled 2014-10-21: qty 10

## 2014-10-21 MED ORDER — SODIUM CHLORIDE 0.9 % IJ SOLN: 3.0000 mL | Freq: Two times a day (BID) | INTRAMUSCULAR | Status: DC

## 2014-10-21 MED ORDER — LIDOCAINE HCL (PF) 1 % IJ SOLN
INTRAMUSCULAR | Status: AC
  Filled 2014-10-21: qty 30

## 2014-10-21 MED ORDER — ASPIRIN 81 MG PO CHEW: 81.0000 mg | CHEWABLE_TABLET | ORAL | Status: DC

## 2014-10-21 MED ORDER — HEPARIN SODIUM (PORCINE) 1000 UNIT/ML IJ SOLN
INTRAMUSCULAR | Status: AC
  Filled 2014-10-21: qty 1

## 2014-10-21 MED ORDER — MIDAZOLAM HCL 2 MG/2ML IJ SOLN
INTRAMUSCULAR | Status: AC
  Filled 2014-10-21: qty 2

## 2014-10-21 MED ORDER — SODIUM CHLORIDE 0.9 % IV SOLN: 250.0000 mL | INTRAVENOUS | Status: DC | PRN

## 2014-10-21 NOTE — Discharge Instructions (Signed)
Radial Site Care °Refer to this sheet in the next few weeks. These instructions provide you with information on caring for yourself after your procedure. Your caregiver may also give you more specific instructions. Your treatment has been planned according to current medical practices, but problems sometimes occur. Call your caregiver if you have any problems or questions after your procedure. °HOME CARE INSTRUCTIONS °· You may shower the day after the procedure. Remove the bandage (dressing) and gently wash the site with plain soap and water. Gently pat the site dry. °· Do not apply powder or lotion to the site. °· Do not submerge the affected site in water for 3 to 5 days. °· Inspect the site at least twice daily. °· Do not flex or bend the affected arm for 24 hours. °· No lifting over 5 pounds (2.3 kg) for 5 days after your procedure. °· Do not drive home if you are discharged the same day of the procedure. Have someone else drive you. °· You may drive 24 hours after the procedure unless otherwise instructed by your caregiver. °· Do not operate machinery or power tools for 24 hours. °· A responsible adult should be with you for the first 24 hours after you arrive home. °What to expect: °· Any bruising will usually fade within 1 to 2 weeks. °· Blood that collects in the tissue (hematoma) may be painful to the touch. It should usually decrease in size and tenderness within 1 to 2 weeks. °SEEK IMMEDIATE MEDICAL CARE IF: °· You have unusual pain at the radial site. °· You have redness, warmth, swelling, or pain at the radial site. °· You have drainage (other than a small amount of blood on the dressing). °· You have chills. °· You have a fever or persistent symptoms for more than 72 hours. °· You have a fever and your symptoms suddenly get worse. °· Your arm becomes pale, cool, tingly, or numb. °· You have heavy bleeding from the site. Hold pressure on the site. °Document Released: 09/29/2010 Document Revised:  11/19/2011 Document Reviewed: 09/29/2010 °[employer]® Patient Information ©2015 [employer]. This information is not intended to replace advice given to you by your health care provider. Make sure you discuss any questions you have with your health care provider. ° °

## 2014-10-21 NOTE — CV Procedure (Signed)
      Cardiac Catheterization Operative Report  Sheila Jacobs [id number] 2/11/20162:49 PM EDWARDS, Milford Cage, NP  Procedure Performed:  1. Left Heart Catheterization 2. Selective Coronary Angiography 3. Left ventricular angiogram  Operator: Lauree Chandler, MD  Arterial access site:  Right radial artery.   Indication: 64 yo female with history of former tobacco abuse, HTN, HLD, morbid obesity with recent chest pain. Lasts for several seconds and has no exertional component, radiation of associated SOB. Stress myoview with possible inferior wall ischemia.                                      Procedure Details: The risks, benefits, complications, treatment options, and expected outcomes were discussed with the patient. The patient and/or family concurred with the proposed plan, giving informed consent. The patient was brought to the cath lab after IV hydration was begun and oral premedication was given. The patient was further sedated with Versed and Fentanyl. The right wrist was assessed with a modified Allens test which was positive. The right wrist was prepped and draped in a sterile fashion. 1% lidocaine was used for local anesthesia. Using the modified Seldinger access technique, a 5 French sheath was placed in the right radial artery. 3 mg Verapamil was given through the sheath. 5500 units IV heparin was given. Standard diagnostic catheters were used to perform selective coronary angiography. A pigtail catheter was used to perform a left ventricular angiogram. The sheath was removed from the right radial artery and a Terumo hemostasis band was applied at the arteriotomy site on the right wrist.    There were no immediate complications. The patient was taken to the recovery area in stable condition.   Hemodynamic Findings: Central aortic pressure: 112/53 Left ventricular pressure: 124/0/17  Angiographic Findings:  Left main: This vessel has an unusual proximal segment but  appears to be widely patent. There is no dampening of the catheter on engagement. The cranial shots demonstrate good reflux around the catheter with streaming of contrast due to the shape of the vessel. The ostium of this vessel does not appear to have atherosclerotic plaque.   Left Anterior Descending Artery: Large caliber vessel that courses to the apex. There is a small caliber diagonal branch with no obstructive disease.   Circumflex Artery: Large caliber vessel with two moderate caliber obtuse marginal branches. No obstructive disease.   Right Coronary Artery: Small non-dominant vessel with no obstructive disease.   Left Ventricular Angiogram: LVEF=65%.   Impression: 1. No angiographic evidence of CAD 2. Normal LV systolic function 3. Non-cardiac chest pain  Recommendations: No further ischemic workup.        Complications:  None. The patient tolerated the procedure well.

## 2014-10-21 NOTE — Interval H&P Note (Signed)
History and Physical Interval Note:  10/21/2014 2:06 PM  Sheila Jacobs  has presented today for cardiac cath with the diagnosis of unstable angina, abnormal stress test  The various methods of treatment have been discussed with the patient and family. After consideration of risks, benefits and other options for treatment, the patient has consented to  Procedure(s): LEFT HEART CATHETERIZATION WITH CORONARY ANGIOGRAM (N/A) as a surgical intervention .  The patient's history has been reviewed, patient examined, no change in status, stable for surgery.  I have reviewed the patient's chart and labs.  Questions were answered to the patient's satisfaction.    Cath Lab Visit (complete for each Cath Lab visit)  Clinical Evaluation Leading to the Procedure:   ACS: No.  Non-ACS:    Anginal Classification: CCS III  Anti-ischemic medical therapy: No Therapy  Non-Invasive Test Results: High-risk stress test findings: cardiac mortality >3%/year  Prior CABG: No previous CABG        MCALHANY,CHRISTOPHER

## 2014-10-21 NOTE — H&P (View-Only) (Signed)
Cardiology Office Note   Date:  10/06/2014   ID:  Sheila Jacobs, DOB 1950-12-27, MRN [id number]  PCP:  Kerin Perna, NP  Cardiologist:  Dr Mardene Celeste, PA-C   Chief Complaint - Precordial Chest Pain    History of Present Illness: Sheila Jacobs is a 64 y.o. female who presents for evaluation of chest pain.   She has a history of chest pain, seen by [facility] 08/2014 and POET recommended but this was never performed.   She was hospitalized 01/09-01/11 for chest pain that started after eating chicken wings. An echo was largely normal (results below) and enzymes were negative. Outpatient evaluation recommended.  She quit taking the metoprolol because of side effects.  Sheila Jacobs has continued to have intermittent episodes of chest pain since then. They are brief, no radiation, no associated symptoms. No association with exertion, possible association with spicy or fatty foods.  She also has consistent DOE but states she can walk well on flat ground (she got SOB getting up onto the exam table). She has chronic LE edema, that is worse when her legs hang down (frequently). She denies PND or orthopnea. Her grandchildren say she snores.   A major concern for her is sequelae of a medication or procedure (she says steroid injection), that includes skin eruption on both hands and hair problems (thinning and won't grow over an inch).  Says she has gained weight and feels that is a problem, but her weight is below previous visits.  She says she has blockages and points to her legs but cannot remember any evaluation.  Past Medical History  Diagnosis Date  . Hypertension   . Enlarged LA (left atrium)   . Hyperlipidemia   . Obesity   . Heavy cigarette smoker     Quit 2015  . Morbid obesity     Past Surgical History  Procedure Laterality Date  . Tubal ligation     ECHO 09/28/2014 - Left ventricle: The cavity size was normal. Systolic function was normal. The  estimated ejection fraction was in the range of 60% to 65%. Wall motion was normal; there were no regioNal wall motion abnormalities. Indeterminate diastolic function. Normal filling pressures. Mild focal basal septal hypertrophy. - Aortic valve: Trileaflet; mildly thickened leaflets. - Mitral valve: There was mild regurgitation. - Left atrium: The atrium was severely dilated. Volume/bsa, S: 42.8ml/m^2. - Right atrium: The atrium was mildly dilated. - Tricuspid valve: There was mild regurgitation. - Inferior vena cava: The vessel was small, appearing collapsed,consistent with low central venous pressure.  05/27/2003 Adenosine Stress Test IMPRESSION 1. FIXED DEFECTS IN THE INFEROLATERAL WALL PROBABLY RELATED TO DIAPHRAGMATIC ATTENUATION ALTHOUGH SCAR CANNOT BE COMPLETELY EXCLUDED. 2. THERE IS A REVERSIBLE DEFECT SEEN IN THE LATERAL WALL EXTENDING FROM THE BASE TO THE MID SEGMENT. THE PATIENT HAS LARGE BREAST SIZE AND THERE IS EVIDENCE OF ATTENUATION ARTIFACT IN THIS REGION AND WHILE THIS MAY BE RELATED TO DIFFERENTIAL BREAST POSITIONING BETWEEN THE TWO SETS OF IMAGES, ISCHEMIA AT THIS LOCATION CANNOT BE COMPLETELY EXCLUDED. 3. LEFT VENTRICULAR EJECTION FRACTION IS 59%.   Current Outpatient Prescriptions  Medication Sig Dispense Refill  . aspirin 81 MG tablet Take 81 mg by mouth daily.    Marland Kitchen atorvastatin (LIPITOR) 20 MG tablet Take 1 tablet (20 mg total) by mouth daily at 6 PM. 30 tablet 0  . calcium citrate-vitamin D (CITRACAL+D) 315-200 MG-UNIT per tablet Take 1 tablet by mouth daily.    . hydrochlorothiazide (HYDRODIURIL)  25 MG tablet Take 25 mg by mouth daily.    Marland Kitchen ibuprofen (ADVIL,MOTRIN) 200 MG tablet Take 400 mg by mouth every 6 (six) hours as needed for moderate pain.     Marland Kitchen omeprazole (PRILOSEC) 20 MG capsule Take 1 capsule (20 mg total) by mouth daily. 30 capsule 0   No current facility-administered medications for this visit.    Allergies:   Cortisone   Social History:   The patient  reports that she quit smoking about 2 months ago. Her smoking use included Cigarettes. She has a 45 pack-year smoking history. She does not have any smokeless tobacco history on file. She reports that she does not drink alcohol or use illicit drugs.   Family History:  The patient's family history includes Heart disease in her mother; Hypertension in her mother; Kidney disease in her mother.    ROS:  Please see the history of present illness.   Otherwise, review of systems are positive for none.   All other systems are reviewed and negative.    PHYSICAL EXAM: VS:  BP 122/74 mmHg  Pulse 70  Ht 5\' 4"  (1.626 m)  Wt 277 lb 9.6 oz (125.919 kg)  BMI 47.63 kg/m2 , BMI Body mass index is 47.63 kg/(m^2). GEN: Well nourished, obese female, in no acute distress HEENT: normal Neck: no JVD, right carotid bruit noted, enlargement to right neck, not tender or fluctuant Cardiac: RRR; no murmurs, rubs, or gallops, 1+ LE edema, distal pulses 2+ 4/4 extremities Respiratory:  clear to auscultation bilaterally, normal work of breathing GI: soft, nontender, nondistended, + BS, no HSM Sheila: no deformity or atrophy Skin: warm and dry, no rash Neuro:  Strength and sensation are intact Psych: euthymic mood, full affect  EKG:  EKG is ordered today. The ekg ordered today demonstrates SR, with borderline 1st degree AV block. No Q waves or ischemic changes  Recent Labs: 08/20/2014: Pro B Natriuretic peptide (BNP) 28.2 09/18/2014: B Natriuretic Peptide 34.5; TSH 0.543 09/19/2014: ALT 13; Hemoglobin 11.0*; Platelets 239 09/20/2014: BUN 8; Creatinine 0.76; Magnesium 2.3; Potassium 3.7; Sodium 139    Lipid Panel    Component Value Date/Time   CHOL 151 09/19/2014 0331   TRIG 52 09/19/2014 0331   HDL 49 09/19/2014 0331   CHOLHDL 3.1 09/19/2014 0331   VLDL 10 09/19/2014 0331   LDLCALC 92 09/19/2014 0331     Wt Readings from Last 3 Encounters:  10/06/14 277 lb 9.6 oz (125.919 kg)  09/18/14 282 lb 6.6  oz (128.1 kg)  09/02/14 281 lb 11.2 oz (127.778 kg)    Dg Chest 2 View  09/18/2014   CLINICAL DATA:  Chest pain, hypertension  EXAM: CHEST  2 VIEW  COMPARISON:  Chest CT 08/20/2014  FINDINGS: Normal cardiac silhouette. Chronic bronchitic markings. No effusion, infiltrate, pneumothorax. Degenerative osteophytosis of the thoracic spine.  IMPRESSION: Chronic bronchitic markings without acute findings.   Electronically Signed   By: Suzy Bouchard M.D.   On: 09/18/2014 02:18    Other studies Reviewed: Additional studies/ records that were reviewed today include: Previous nuclear stress test 2004. Review of the above records demonstrates: No clear ischemia  ASSESSMENT AND PLAN: Patient Problem List   Diagnosis Date Noted  . Right carotid bruit - Carotid dopplers ordered, f/u on results 10/06/2014  . Precordial chest pain - Lexiscan MV, 2 day study. Discussed need for stress test with patient, she agrees 09/18/2014  . Hypertension - currently well-controlled, continue current meds 09/18/2014  . Hyperlipidemia - Continue  statin, recent lipid/liver were good 09/18/2014  . Enlarged LA (left atrium) - unclear cause, no hx arrhythmia and mild MR on echo, follow     Current medicines are reviewed at length with the patient today.  The patient does not have concerns regarding medicines.  The following changes have been made:  no change  Labs/ tests ordered today include: Adenosine MV, Carotid Dopplers  Orders Placed This Encounter  Procedures  . Myocardial Perfusion Imaging  . EKG 12-Lead  . Doppler carotid     Disposition:   FU with Dr. [name] in 3 months   Signed, Rosaria Ferries, PA-C  10/06/2014 10:57 AM    [address], [place], [place]  [address] Phone: [phone]; Fax: [phone]

## 2014-11-09 ENCOUNTER — Encounter: Payer: Self-pay | Admitting: Cardiology

## 2014-11-09 ENCOUNTER — Ambulatory Visit (INDEPENDENT_AMBULATORY_CARE_PROVIDER_SITE_OTHER): Payer: Medicare HMO | Admitting: Cardiology

## 2014-11-09 VITALS — BP 150/80 | HR 85 | Ht 64.0 in | Wt 276.0 lb

## 2014-11-09 DIAGNOSIS — R0789 Other chest pain: Secondary | ICD-10-CM | POA: Diagnosis not present

## 2014-11-09 NOTE — Progress Notes (Signed)
   HPI The patient presents for follow-up of chest pain. She was referred from the emergency room. She had a stress perfusion study consistent with severe inferior ischemia.  However, follow-up cardiac catheterization demonstrated normal coronaries.   Since then she continues to get some fleeting chest discomfort. This is as described in previous notes. However, it's probably not been as severe as her previous episode. She did have headaches which she blamed on metoprolol and she stopped this. She had her hydrochlorothiazide increased.  She denies any new cardiovascular symptoms.  Allergies  Allergen Reactions  . Cortisone Rash    Current Outpatient Prescriptions  Medication Sig Dispense Refill  . aspirin 81 MG tablet Take 81 mg by mouth daily.    Marland Kitchen atorvastatin (LIPITOR) 20 MG tablet Take 1 tablet (20 mg total) by mouth daily at 6 PM. 30 tablet 0  . calcium citrate-vitamin D (CITRACAL+D) 315-200 MG-UNIT per tablet Take 1 tablet by mouth daily.    . hydrochlorothiazide (HYDRODIURIL) 25 MG tablet Take 25 mg by mouth daily.    Marland Kitchen ibuprofen (ADVIL,MOTRIN) 200 MG tablet Take 400 mg by mouth every 6 (six) hours as needed for moderate pain.     Marland Kitchen omeprazole (PRILOSEC) 20 MG capsule Take 1 capsule (20 mg total) by mouth daily. 30 capsule 0   No current facility-administered medications for this visit.    Past Medical History  Diagnosis Date  . Hypertension   . Enlarged LA (left atrium)   . Hyperlipidemia   . Obesity   . Heavy cigarette smoker     Quit 2015  . Morbid obesity     Past Surgical History  Procedure Laterality Date  . Tubal ligation    . Left heart catheterization with coronary angiogram N/A 10/21/2014    Procedure: LEFT HEART CATHETERIZATION WITH CORONARY ANGIOGRAM;  Surgeon: Burnell Blanks, MD;  Location: [facility] CATH LAB;  Service: Cardiovascular;  Laterality: N/A;    ROS:  As stated in the HPI and negative for all other systems.  PHYSICAL EXAM BP 150/80 mmHg  Pulse  85  Ht 5\' 4"  (1.626 m)  Wt 276 lb (125.193 kg)  BMI 47.35 kg/m2  GENERAL:  Well appearing NECK:  No jugular venous distention, waveform within normal limits, carotid upstroke brisk and symmetric, right bruit,  no thyromegaly LUNGS:  Clear to auscultation bilaterally CHEST:  Unremarkable HEART:  PMI not displaced or sustained,S1 and S2 within normal limits, no S3, no S4, no clicks, no rubs, no murmurs ABD:  Flat, positive bowel sounds normal in frequency in pitch, no bruits, no rebound, no guarding, no midline pulsatile mass, no hepatomegaly, no splenomegaly EXT:  2 plus pulses throughout, no edema, no cyanosis no clubbing, lymphedema, right wrist catheterization site unremarkable   EKG:  Sinus rhythm, rate 85, leftward axis, early transition in lead V2, moderate voltage criteria for LVH, no acute ST-T wave changes.  11/09/2014  ASSESSMENT AND PLAN  CHEST PAIN:  This is nonanginal. No further cardiovascular testing is indicated.  BRUIT:  She had only mild carotid plaque and no follow-up is necessary at this point. She needs primary risk reduction.  RISK REDUCTION:  I have suggested that she could stop the Lipitor as there is no indication for primary prevention of her situation.  HTN:  Her blood pressure slightly elevated but her meds were just adjusted by her primary provider.  I will defer to EDWARDS, MICHELLE P, NP

## 2014-11-09 NOTE — Patient Instructions (Signed)
Your physician recommends that you schedule a follow-up appointment in: as needed with Dr. [name]  Stop taking your lipitor and your metoprolol

## 2014-12-13 ENCOUNTER — Ambulatory Visit: Payer: Medicare Other | Admitting: Cardiology

## 2014-12-21 ENCOUNTER — Telehealth: Payer: Self-pay | Admitting: Cardiology

## 2014-12-21 NOTE — Telephone Encounter (Signed)
Received records from [facility] for Dr [name] on 01/31/15.  Records given to [facility] (medical records) for Dr Hochrein's schedule on 01/31/15. lp

## 2015-01-01 ENCOUNTER — Encounter (HOSPITAL_COMMUNITY): Payer: Self-pay | Admitting: Emergency Medicine

## 2015-01-01 ENCOUNTER — Emergency Department (HOSPITAL_COMMUNITY): Payer: Medicare HMO

## 2015-01-01 ENCOUNTER — Emergency Department (HOSPITAL_COMMUNITY)
Admission: EM | Admit: 2015-01-01 | Discharge: 2015-01-01 | Disposition: A | Discharge status: Home / Self Care | Payer: Medicare HMO | Attending: Emergency Medicine | Admitting: Emergency Medicine

## 2015-01-01 DIAGNOSIS — Z7982 Long term (current) use of aspirin: Secondary | ICD-10-CM | POA: Insufficient documentation

## 2015-01-01 DIAGNOSIS — R079 Chest pain, unspecified: Secondary | ICD-10-CM | POA: Diagnosis present

## 2015-01-01 DIAGNOSIS — R42 Dizziness and giddiness: Secondary | ICD-10-CM | POA: Insufficient documentation

## 2015-01-01 DIAGNOSIS — R002 Palpitations: Secondary | ICD-10-CM | POA: Diagnosis not present

## 2015-01-01 DIAGNOSIS — R51 Headache: Secondary | ICD-10-CM | POA: Insufficient documentation

## 2015-01-01 DIAGNOSIS — Z79899 Other long term (current) drug therapy: Secondary | ICD-10-CM | POA: Insufficient documentation

## 2015-01-01 DIAGNOSIS — R0789 Other chest pain: Secondary | ICD-10-CM | POA: Insufficient documentation

## 2015-01-01 DIAGNOSIS — I10 Essential (primary) hypertension: Secondary | ICD-10-CM | POA: Diagnosis not present

## 2015-01-01 DIAGNOSIS — E785 Hyperlipidemia, unspecified: Secondary | ICD-10-CM | POA: Insufficient documentation

## 2015-01-01 DIAGNOSIS — Z87891 Personal history of nicotine dependence: Secondary | ICD-10-CM | POA: Diagnosis not present

## 2015-01-01 DIAGNOSIS — R1030 Lower abdominal pain, unspecified: Secondary | ICD-10-CM | POA: Insufficient documentation

## 2015-01-01 LAB — CBC
HCT: 35 % — ABNORMAL LOW (ref 36.0–46.0)
Hemoglobin: 11.7 g/dL — ABNORMAL LOW (ref 12.0–15.0)
MCH: 31.6 pg (ref 26.0–34.0)
MCHC: 33.4 g/dL (ref 30.0–36.0)
MCV: 94.6 fL (ref 78.0–100.0)
Platelets: 229 10*3/uL (ref 150–400)
RBC: 3.7 MIL/uL — AB (ref 3.87–5.11)
RDW: 12.4 % (ref 11.5–15.5)
WBC: 5.9 10*3/uL (ref 4.0–10.5)

## 2015-01-01 LAB — BASIC METABOLIC PANEL
Anion gap: 9 (ref 5–15)
BUN: 8 mg/dL (ref 6–23)
CALCIUM: 9.8 mg/dL (ref 8.4–10.5)
CHLORIDE: 100 mmol/L (ref 96–112)
CO2: 28 mmol/L (ref 19–32)
CREATININE: 0.77 mg/dL (ref 0.50–1.10)
GFR calc Af Amer: >90 mL/min (ref >90)
GFR calc non Af Amer: 86 mL/min — ABNORMAL LOW (ref >90)
GLUCOSE: 120 mg/dL — AB (ref 70–99)
Potassium: 3.3 mmol/L — ABNORMAL LOW (ref 3.5–5.1)
Sodium: 137 mmol/L (ref 135–145)

## 2015-01-01 LAB — I-STAT TROPONIN, ED: TROPONIN I, POC: 0 ng/mL (ref 0.00–0.08)

## 2015-01-01 LAB — TROPONIN I

## 2015-01-01 IMAGING — CR DG CHEST 2V
2 series · 2 of 2 positions shown · non-contrast
Comparison: [DATE] and [DATE]

CLINICAL DATA: Chest pain and palpitations today.

EXAM:
CHEST  2 VIEW

[chest pa]
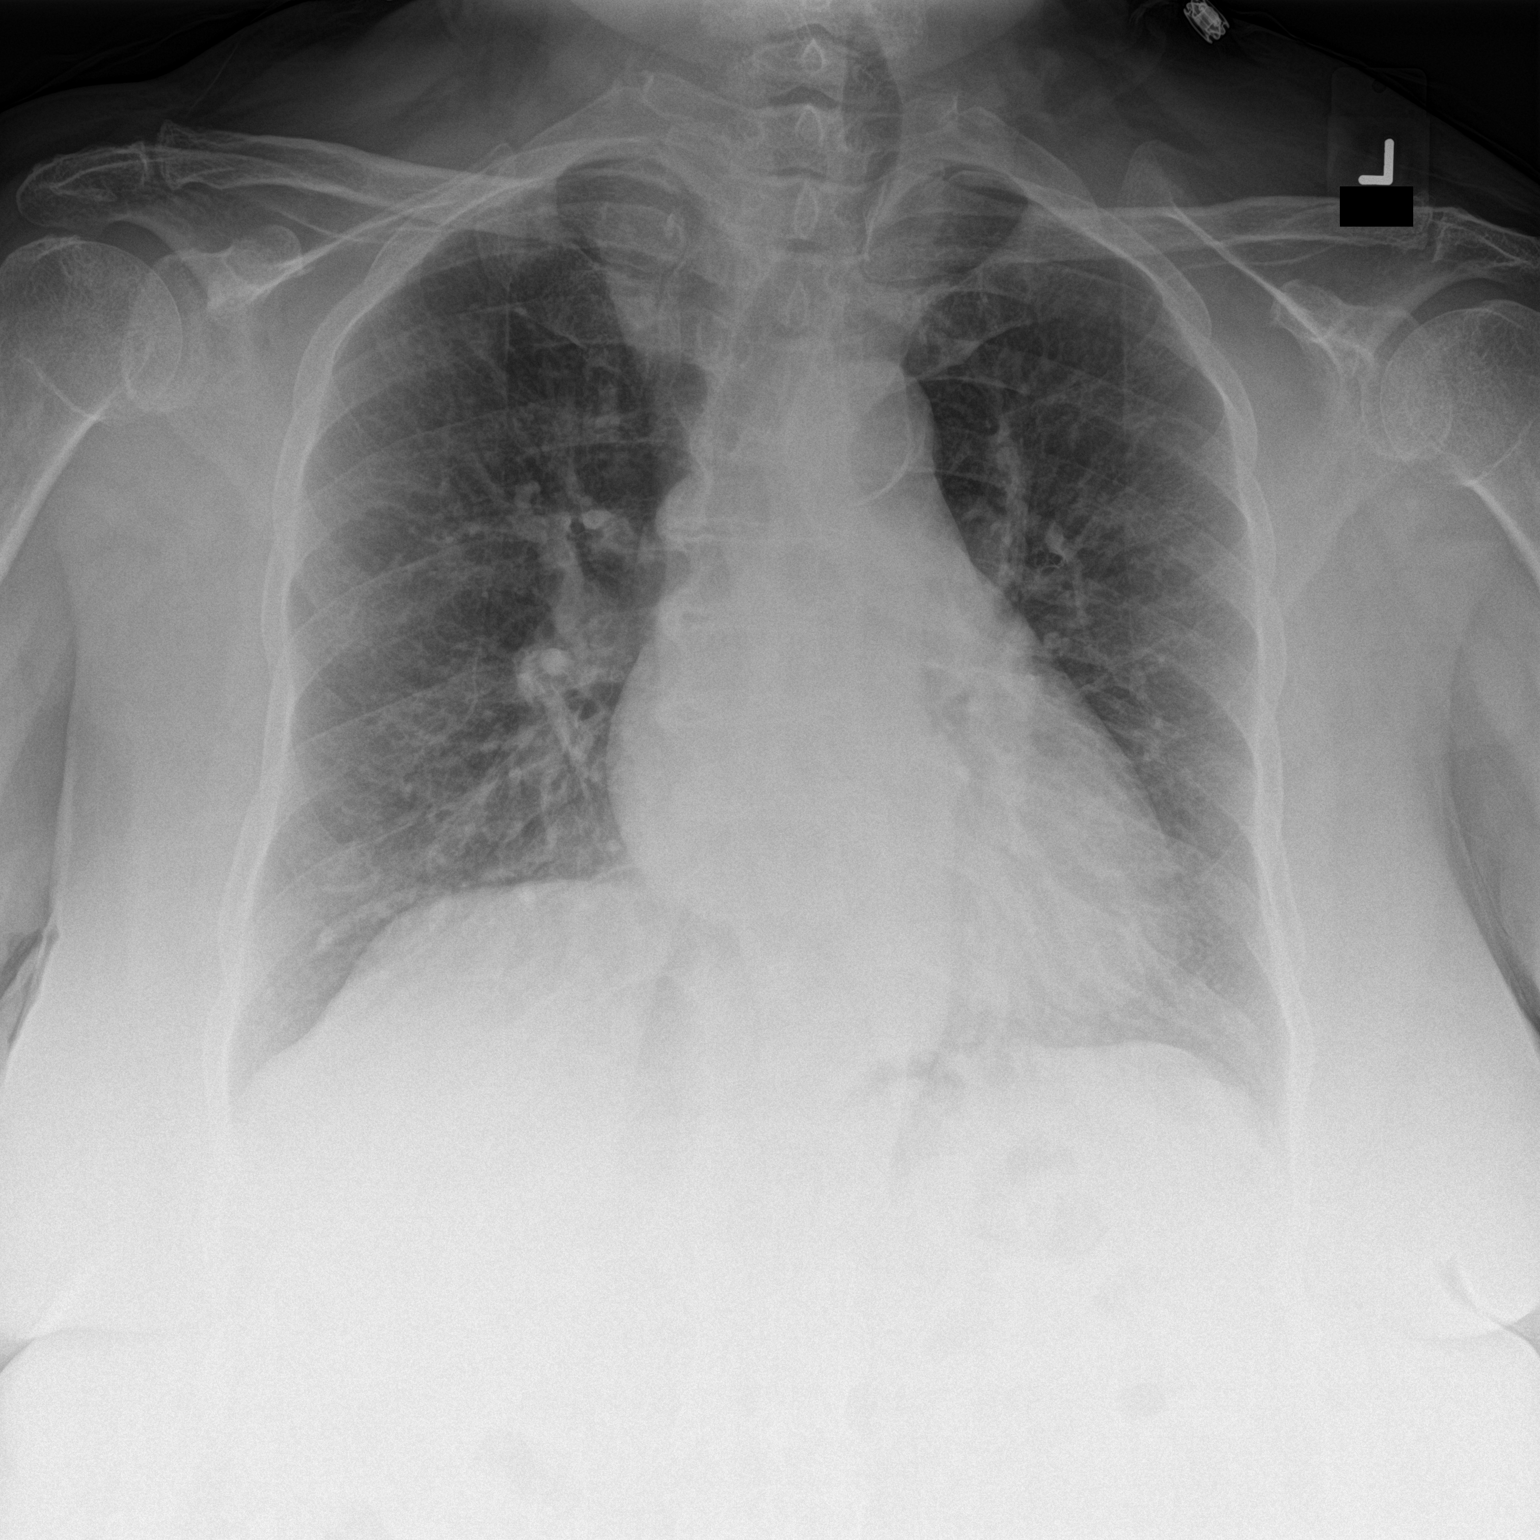

[chest lat]
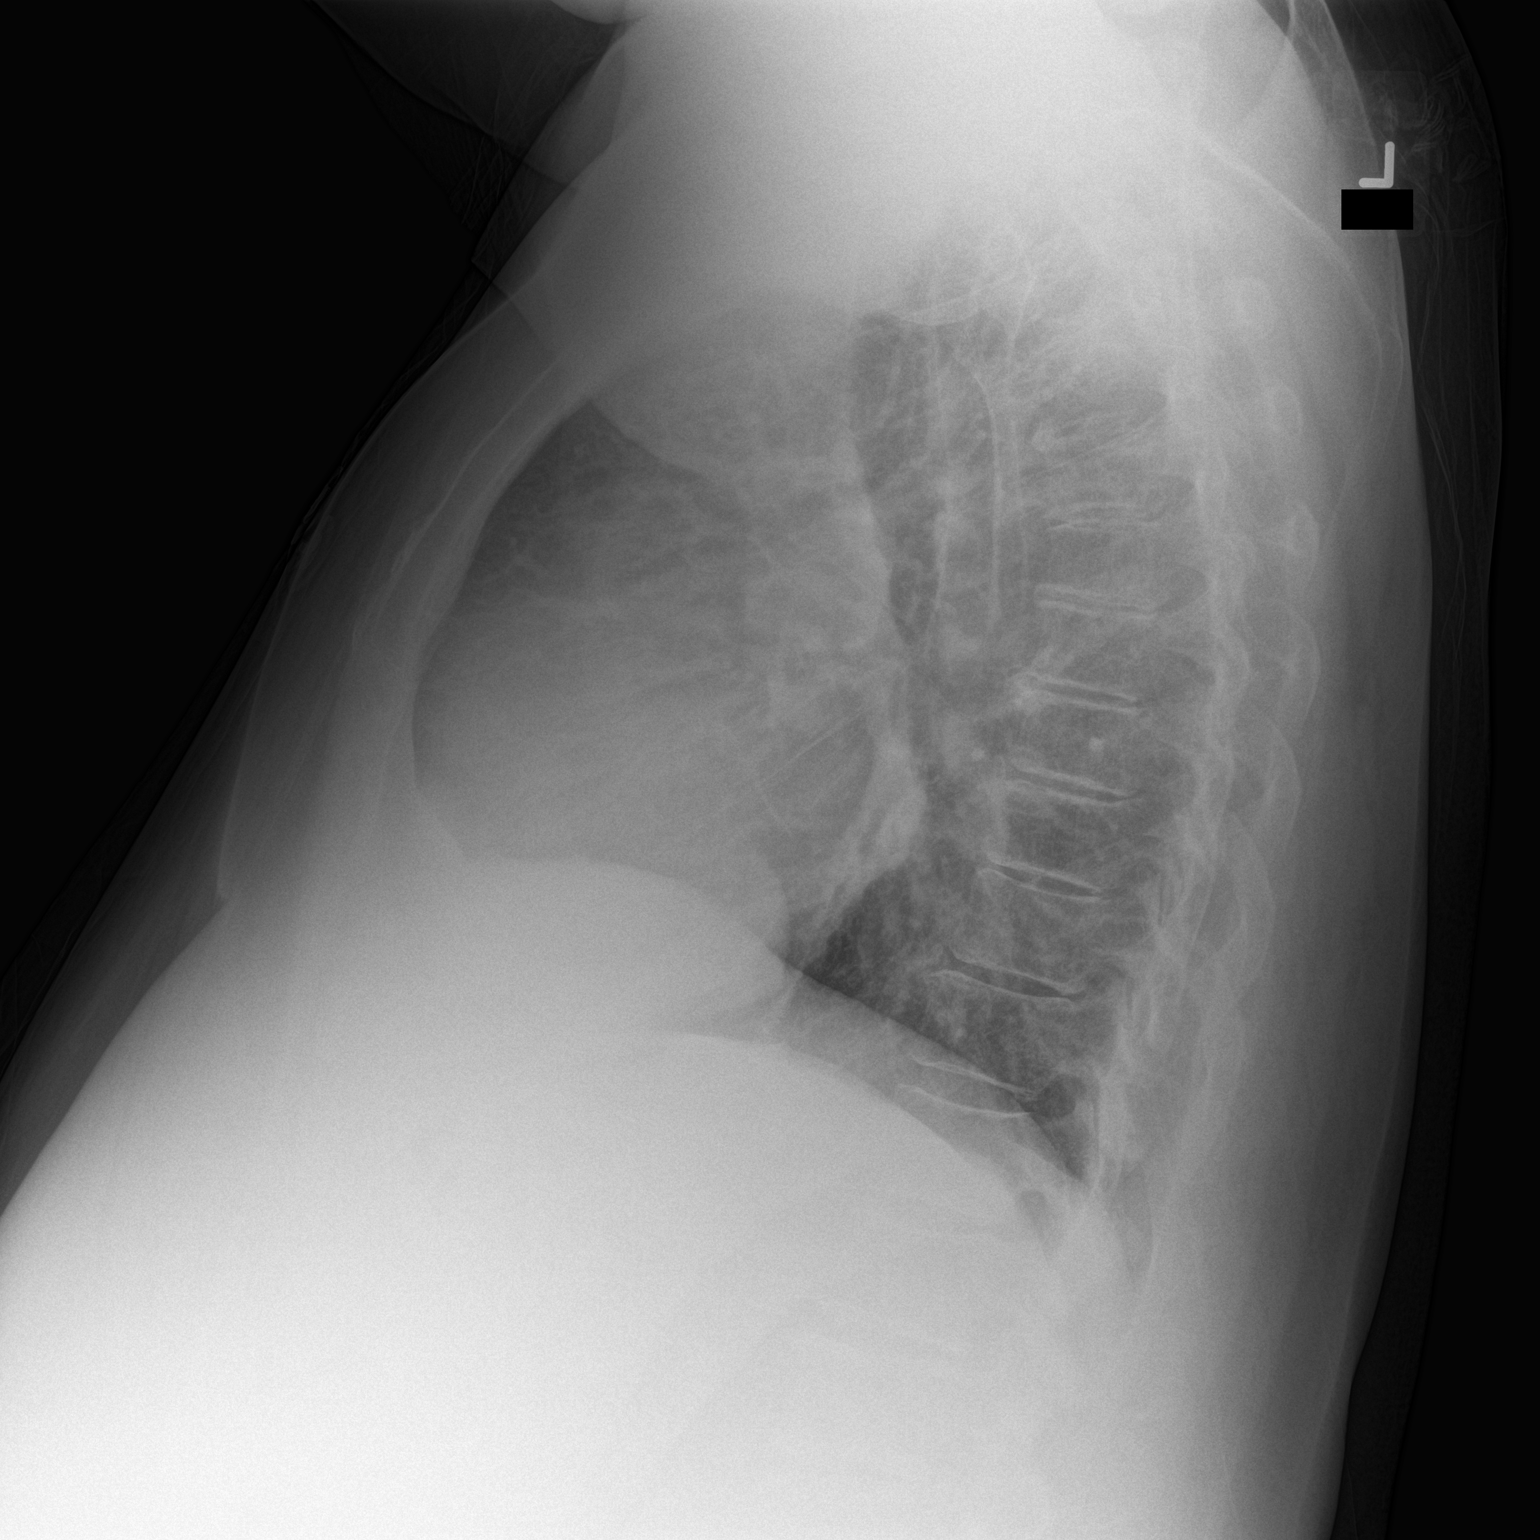

[2 of 2 positions shown; findings below may reference images not displayed]

FINDINGS: There is cardiomegaly with tortuosity and calcification of the
thoracic aorta. Pulmonary vascularity is normal. Lungs are clear. No
significant osseous abnormality. Chronic deviation of the trachea to
the left above the thoracic inlet, unchanged since [4Z]. This is
probably due to a goiter.
IMPRESSION: No acute abnormalities.

## 2015-01-01 NOTE — Discharge Instructions (Signed)
Your tests tonight are normal. You did not have any irregular or fast heart beating on your monitor while you were in the ED. Have Dr Hochrein's office check you this coming week.  Recheck if you get constant chest pain that lasts more than 20-30 minutes constantly.

## 2015-01-01 NOTE — ED Notes (Signed)
PT getting dressed

## 2015-01-01 NOTE — ED Notes (Signed)
Pt. reports central chest pain with palpitations onset this evening , denies SOB , no  nausea or diaphoresis .

## 2015-01-01 NOTE — ED Notes (Signed)
Pt difficult to arouse 

## 2015-01-01 NOTE — ED Notes (Signed)
Pt placed in a gown and hooked up to the 5 lead, BP cuff and pulse ox

## 2015-01-01 NOTE — ED Notes (Signed)
Pt hooked back up to the monitor with the 5 lead, BP cuff and pulse ox 

## 2015-01-01 NOTE — ED Provider Notes (Signed)
CSN: [id number]     Arrival date & time 01/01/15  0012 History  This chart was scribed for Rolland Porter, MD by Delphia Grates, ED Scribe. This patient was seen in room A08C/A08C and the patient's care was started at 1:09 AM.    Chief Complaint  Patient presents with  . Chest Pain    The history is provided by the patient. No language interpreter was used.     HPI Comments: Sheila Jacobs is a 64 y.o. female who presents to the Emergency Department complaining of intermittent chest pain with associated palpitations that she states feels like her heart is racing with onset 12 hours ago. Patient describes the episodes as sharp and brief lasting approximately 1 minute. The pains are in her lower central chest and radiate to her lower abdomen bilaterally. She also mentions associated dizziness and "light" headaches. Patient reports eating normally with symptoms. She reports history of prior similar episodes 2 months ago and was admitted "over the weekend". She was informed she had high cholesterol and was subsequently started on cholesterol medication and reports her HTN medication was increased. She denies SOB, diaphoresis, nausea, vomiting, diarrhea, or any new swelling to the BLE which are chronically swollen. Patient is a former smoker for the past year. Patient resides with her daughter.  PCP: Kerin Perna, NP [facility] Cardiologist: Dr [name]  Past Medical History  Diagnosis Date  . Hypertension   . Enlarged LA (left atrium)   . Hyperlipidemia   . Obesity   . Heavy cigarette smoker     Quit 2015  . Morbid obesity    Past Surgical History  Procedure Laterality Date  . Tubal ligation    . Left heart catheterization with coronary angiogram N/A 10/21/2014    Procedure: LEFT HEART CATHETERIZATION WITH CORONARY ANGIOGRAM;  Surgeon: Burnell Blanks, MD;  Location: [facility] CATH LAB;  Service: Cardiovascular;  Laterality: N/A;   Family History  Problem Relation Age of  Onset  . Hypertension Mother   . Heart disease Mother     Pacemaker  . Kidney disease Mother    History  Substance Use Topics  . Smoking status: Former Smoker -- 1.00 packs/day for 45 years    Types: Cigarettes    Quit date: 08/06/2014  . Smokeless tobacco: Not on file  . Alcohol Use: No   Lives at home Lives with daughter  OB History    No data available     Review of Systems  Constitutional: Negative for diaphoresis.  Respiratory: Negative for shortness of breath.   Cardiovascular: Positive for chest pain and palpitations. Negative for leg swelling.  Gastrointestinal: Positive for abdominal pain. Negative for nausea, vomiting and diarrhea.  Neurological: Positive for dizziness and headaches.      Allergies  Cortisone  Home Medications   Prior to Admission medications   Medication Sig Start Date End Date Taking? Authorizing Provider  aspirin 81 MG tablet Take 81 mg by mouth daily.   Yes Historical Provider, MD  calcium citrate-vitamin D (CITRACAL+D) 315-200 MG-UNIT per tablet Take 1 tablet by mouth daily.   Yes Historical Provider, MD  hydrochlorothiazide (HYDRODIURIL) 25 MG tablet Take 25 mg by mouth daily.   Yes Historical Provider, MD  omeprazole (PRILOSEC) 20 MG capsule Take 1 capsule (20 mg total) by mouth daily. Patient not taking: Reported on 01/01/2015 09/20/14   Bynum Bellows, MD   Triage Vitals: BP 130/75 mmHg  Pulse 83  Temp(Src) 97.7 F (36.5 C) (Oral)  Resp 14  Ht 5\' 4"  (1.626 m)  Wt 277 lb (125.646 kg)  BMI 47.52 kg/m2  SpO2 98%  Vital signs normal    Physical Exam  Constitutional: She is oriented to person, place, and time. She appears well-developed and well-nourished.  Non-toxic appearance. She does not appear ill. No distress.  HENT:  Head: Normocephalic and atraumatic.  Right Ear: External ear normal.  Left Ear: External ear normal.  Nose: Nose normal. No mucosal edema or rhinorrhea.  Mouth/Throat: Oropharynx is clear and moist and  mucous membranes are normal. No dental abscesses or uvula swelling.  Eyes: Conjunctivae and EOM are normal. Pupils are equal, round, and reactive to light.  Neck: Normal range of motion and full passive range of motion without pain. Neck supple.  Cardiovascular: Normal rate, regular rhythm and normal heart sounds.  Exam reveals no gallop and no friction rub.   No murmur heard. Pulmonary/Chest: Effort normal and breath sounds normal. No respiratory distress. She has no wheezes. She has no rhonchi. She has no rales. She exhibits no tenderness and no crepitus.  Abdominal: Soft. Normal appearance and bowel sounds are normal. She exhibits no distension. There is no tenderness. There is no rebound and no guarding.  Musculoskeletal: Normal range of motion. She exhibits no edema or tenderness.  Moves all extremities well.  No pitting edema  Neurological: She is alert and oriented to person, place, and time. She has normal strength. No cranial nerve deficit.  Skin: Skin is warm, dry and intact. No rash noted. No erythema. No pallor.  Psychiatric: She has a normal mood and affect. Her speech is normal and behavior is normal. Her mood appears not anxious.  Nursing note and vitals reviewed.   ED Course  Procedures (including critical care time)   Medications - No data to display  DIAGNOSTIC STUDIES: Oxygen Saturation is 98% on room air, normal by my interpretation.    COORDINATION OF CARE: At 0118 Discussed treatment plan with patient which includes repeat troponin to r/o possible MI. Patient agrees.    Patient has been sleeping during most of her ED visit. She was given the result of her delta troponins. Patient's monitor showed no ectopy or rapid heart beating while she was on the monitor during her ED visit. At this point it was felt she could be discharged home and follow-up of her cardiologist.   Labs Review Results for orders placed or performed during the hospital encounter of 01/01/15   CBC  Result Value Ref Range   WBC 5.9 4.0 - 10.5 K/uL   RBC 3.70 (L) 3.87 - 5.11 MIL/uL   Hemoglobin 11.7 (L) 12.0 - 15.0 g/dL   HCT 35.0 (L) 36.0 - 46.0 %   MCV 94.6 78.0 - 100.0 fL   MCH 31.6 26.0 - 34.0 pg   MCHC 33.4 30.0 - 36.0 g/dL   RDW 12.4 11.5 - 15.5 %   Platelets 229 150 - 400 K/uL  Basic metabolic panel  Result Value Ref Range   Sodium 137 135 - 145 mmol/L   Potassium 3.3 (L) 3.5 - 5.1 mmol/L   Chloride 100 96 - 112 mmol/L   CO2 28 19 - 32 mmol/L   Glucose, Bld 120 (H) 70 - 99 mg/dL   BUN 8 6 - 23 mg/dL   Creatinine, Ser 0.77 0.50 - 1.10 mg/dL   Calcium 9.8 8.4 - 10.5 mg/dL   GFR calc non Af Amer 86 (L) >90 mL/min   GFR calc Af Amer >  90 >90 mL/min   Anion gap 9 5 - 15  Troponin I  Result Value Ref Range   Troponin I <0.03 <0.031 ng/mL  I-stat troponin, ED (not at [facility])  Result Value Ref Range   Troponin i, poc 0.00 0.00 - 0.08 ng/mL   Comment 3           Laboratory interpretation all normal except mild anemia     Imaging Review Dg Chest 2 View  01/01/2015   CLINICAL DATA:  Chest pain and palpitations today.  EXAM: CHEST  2 VIEW  COMPARISON:  09/18/2014 and 08/22/2007  FINDINGS: There is cardiomegaly with tortuosity and calcification of the thoracic aorta. Pulmonary vascularity is normal. Lungs are clear. No significant osseous abnormality. Chronic deviation of the trachea to the left above the thoracic inlet, unchanged since 2008. This is probably due to a goiter.  IMPRESSION: No acute abnormalities.   Electronically Signed   By: Lorriane Shire M.D.   On: 01/01/2015 01:58     EKG Interpretation   Date/Time:  Saturday January 01 2015 00:22:08 EDT Ventricular Rate:  81 PR Interval:  196 QRS Duration: 96 QT Interval:  382 QTC Calculation: 443 R Axis:   -33 Text Interpretation:  Normal sinus rhythm Left axis deviation RSR' or QR  pattern in V1 suggests right ventricular conduction delay No significant  change since last tracing 18 Sep 2014 Confirmed by  [facility]  MD-I, Lamoine Fredricksen  ([id number]) on 01/01/2015 12:47:09 AM      MDM   Final diagnoses:  Atypical chest pain  Palpitations    Plan discharge  Rolland Porter, MD, FACEP   I personally performed the services described in this documentation, which was scribed in my presence. The recorded information has been reviewed and considered.  Rolland Porter, MD, Barbette Or, MD 01/01/15 [phone]

## 2015-01-10 ENCOUNTER — Telehealth: Payer: Self-pay | Admitting: Cardiology

## 2015-01-11 NOTE — Telephone Encounter (Signed)
Close encounter 

## 2015-01-31 ENCOUNTER — Encounter: Payer: Self-pay | Admitting: Cardiology

## 2015-01-31 ENCOUNTER — Ambulatory Visit (INDEPENDENT_AMBULATORY_CARE_PROVIDER_SITE_OTHER): Payer: Medicare HMO | Admitting: Cardiology

## 2015-01-31 VITALS — BP 136/70 | HR 80 | Ht 64.0 in | Wt 271.5 lb

## 2015-01-31 DIAGNOSIS — R079 Chest pain, unspecified: Secondary | ICD-10-CM | POA: Diagnosis not present

## 2015-01-31 DIAGNOSIS — I10 Essential (primary) hypertension: Secondary | ICD-10-CM | POA: Diagnosis not present

## 2015-01-31 DIAGNOSIS — G441 Vascular headache, not elsewhere classified: Secondary | ICD-10-CM

## 2015-01-31 NOTE — Progress Notes (Signed)
   HPI The patient presents for follow-up of chest pain. She was referred from the emergency room. She had a stress perfusion study consistent with severe inferior ischemia.  However, follow-up cardiac catheterization demonstrated normal coronaries.   Since then she continues to get some fleeting chest discomfort. This is as described in previous notes. She did have headaches.  She continues to complain of these. However, she doesn't report any presyncope or syncope. She does report snoring but no take daytime somnolence. She says she feels rested when she wakes up. Her biggest complaint is a rushing sound in her head. Here her heart beating. This and the headaches are most disturbing.   Allergies  Allergen Reactions  . Cortisone Rash    Current Outpatient Prescriptions  Medication Sig Dispense Refill  . aspirin 81 MG tablet Take 81 mg by mouth daily.    . calcium citrate-vitamin D (CITRACAL+D) 315-200 MG-UNIT per tablet Take 1 tablet by mouth daily.    . hydrochlorothiazide (HYDRODIURIL) 25 MG tablet Take 25 mg by mouth daily.    Marland Kitchen omeprazole (PRILOSEC) 20 MG capsule Take 1 capsule (20 mg total) by mouth daily. 30 capsule 0   No current facility-administered medications for this visit.    Past Medical History  Diagnosis Date  . Hypertension   . Enlarged LA (left atrium)   . Hyperlipidemia   . Obesity   . Heavy cigarette smoker     Quit 2015  . Morbid obesity     Past Surgical History  Procedure Laterality Date  . Tubal ligation    . Left heart catheterization with coronary angiogram N/A 10/21/2014    Procedure: LEFT HEART CATHETERIZATION WITH CORONARY ANGIOGRAM;  Surgeon: Burnell Blanks, MD;  Location: [facility] CATH LAB;  Service: Cardiovascular;  Laterality: N/A;    ROS:  As stated in the HPI and negative for all other systems.  PHYSICAL EXAM BP 136/70 mmHg  Pulse 80  Ht 5\' 4"  (1.626 m)  Wt 271 lb 8 oz (123.152 kg)  BMI 46.58 kg/m2  GENERAL:  Well appearing NECK:  No  jugular venous distention, waveform within normal limits, carotid upstroke brisk and symmetric, right bruit,  no thyromegaly LUNGS:  Clear to auscultation bilaterally CHEST:  Unremarkable HEART:  PMI not displaced or sustained,S1 and S2 within normal limits, no S3, no S4, no clicks, no rubs, no murmurs ABD:  Flat, positive bowel sounds normal in frequency in pitch, no bruits, no rebound, no guarding, no midline pulsatile mass, no hepatomegaly, no splenomegaly EXT:  2 plus pulses throughout, no edema, no cyanosis no clubbing, lymphedema, right wrist catheterization site unremarkable   ASSESSMENT AND PLAN  CHEST PAIN:  This is nonanginal. No further cardiovascular testing is indicated.  We reviewed this again I reassured her that her catheterization was normal.  BRUIT:  She had only mild carotid plaque and no follow-up is necessary at this point. She needs primary risk reduction.  "Rains":  Because of this and the pain in her head I will order a head CT.  If this doesn't show anything then she can follow with her PCP.    HTN:  Her blood pressure slightly elevated but her meds were just adjusted by her primary provider.  I will defer to EDWARDS, MICHELLE P, NP

## 2015-01-31 NOTE — Patient Instructions (Signed)
Your physician recommends that you schedule a follow-up appointment in: as needed with Dr. [name]  We have ordered a ct of your head for you to get done at [facility]

## 2015-02-08 ENCOUNTER — Other Ambulatory Visit (HOSPITAL_COMMUNITY): Payer: Medicare HMO

## 2015-02-08 ENCOUNTER — Ambulatory Visit (HOSPITAL_COMMUNITY)
Admission: RE | Admit: 2015-02-08 | Discharge: 2015-02-08 | Disposition: A | Discharge status: Home / Self Care | Payer: Medicare HMO | Source: Ambulatory Visit | Attending: Cardiology | Admitting: Cardiology

## 2015-02-08 DIAGNOSIS — G441 Vascular headache, not elsewhere classified: Secondary | ICD-10-CM

## 2015-02-08 DIAGNOSIS — R269 Unspecified abnormalities of gait and mobility: Secondary | ICD-10-CM | POA: Insufficient documentation

## 2015-02-08 DIAGNOSIS — Z8673 Personal history of transient ischemic attack (TIA), and cerebral infarction without residual deficits: Secondary | ICD-10-CM | POA: Insufficient documentation

## 2015-02-08 DIAGNOSIS — R42 Dizziness and giddiness: Secondary | ICD-10-CM | POA: Insufficient documentation

## 2015-02-08 IMAGING — CT CT HEAD W/O CM
1 series · 16 of 30 positions shown, 20 images · non-contrast
Comparison: None.

CLINICAL DATA: Dizziness.  Unsteady gait.

EXAM:
CT HEAD WITHOUT CONTRAST
TECHNIQUE: Contiguous axial images were obtained from the base of the skull
through the vertex without intravenous contrast.

[Series 2: head 5.0 h30s · axial · 0.53mm/px · z∈[+1176,+1331]mm · 16 of 35 slices shown, 20 images]
[im 2/35  brain]
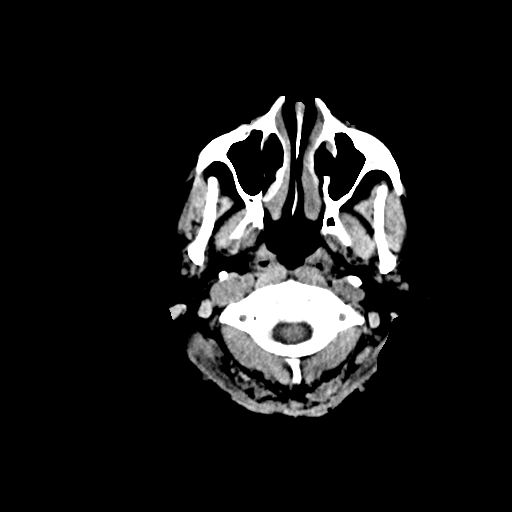
[im 2/35  bone]
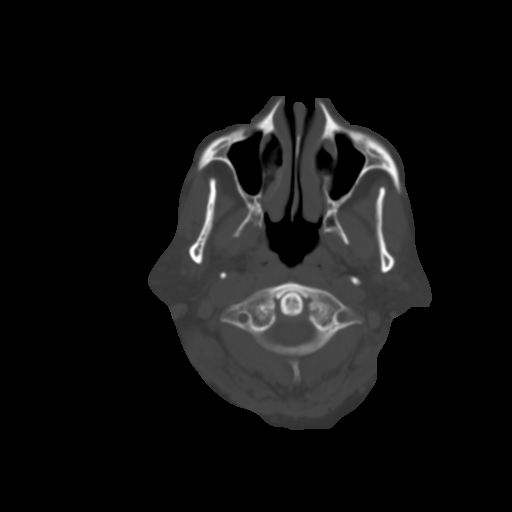
[im 4/35  brain]
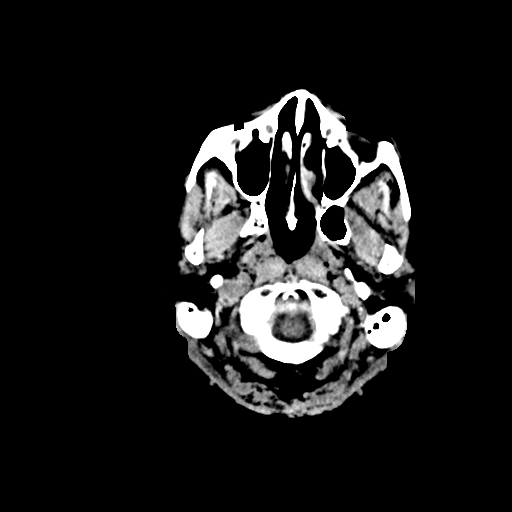
[im 6/35  brain]
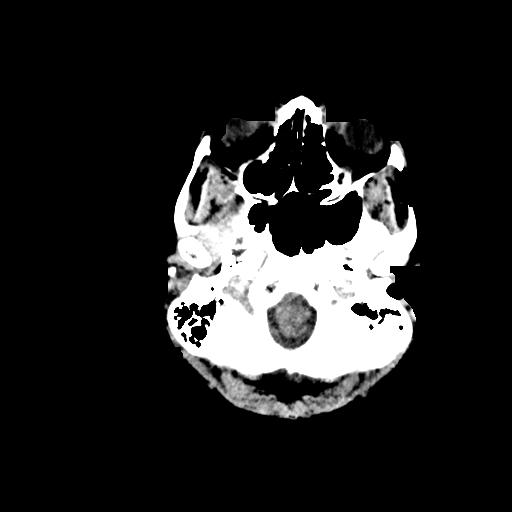
[im 9/35  brain]
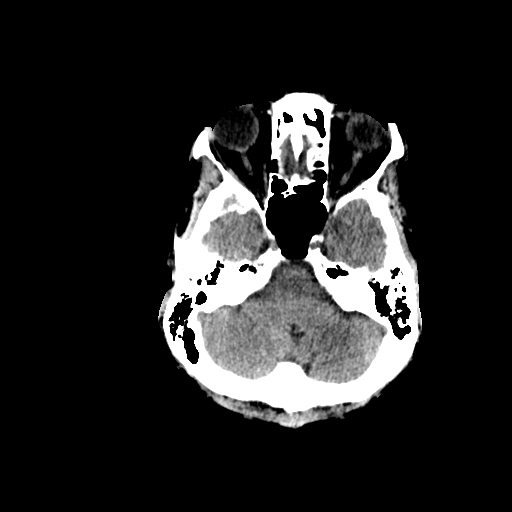
[im 10/35  brain]
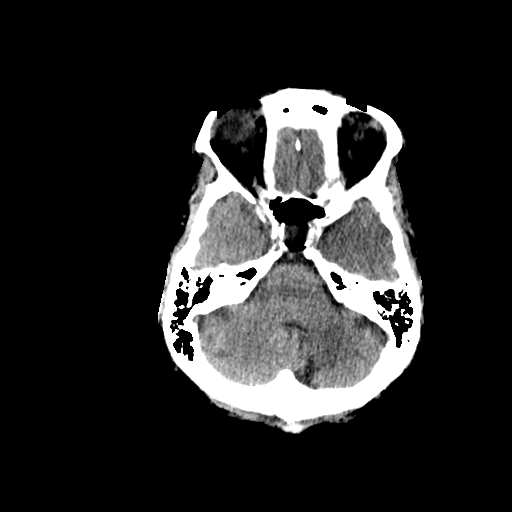
[im 10/35  bone]
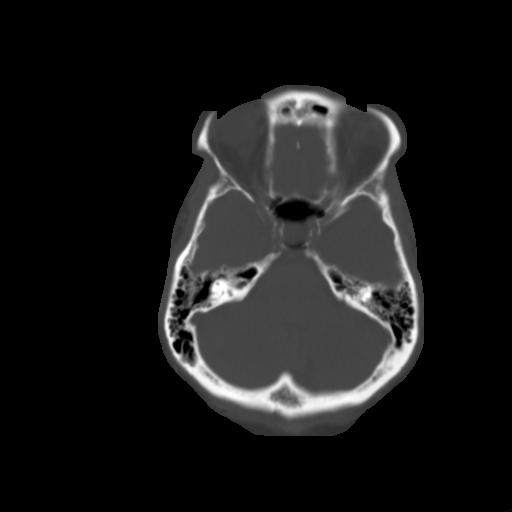
[im 12/35  brain]
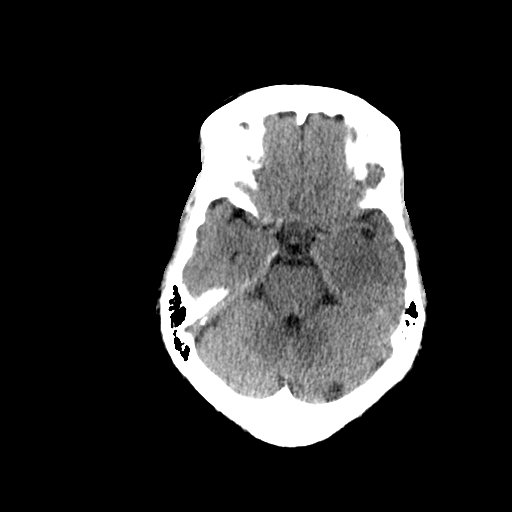
[im 15/35  brain]
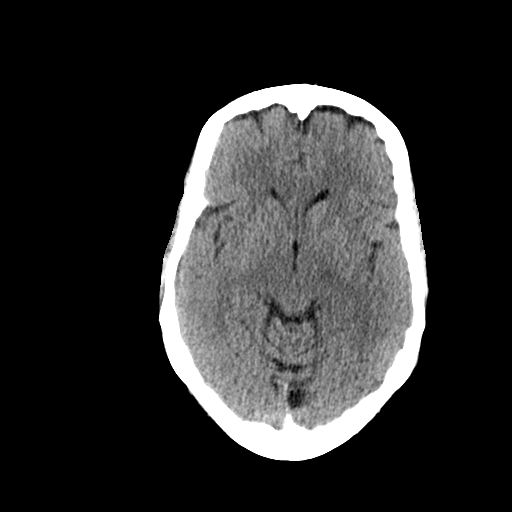
[im 17/35  brain]
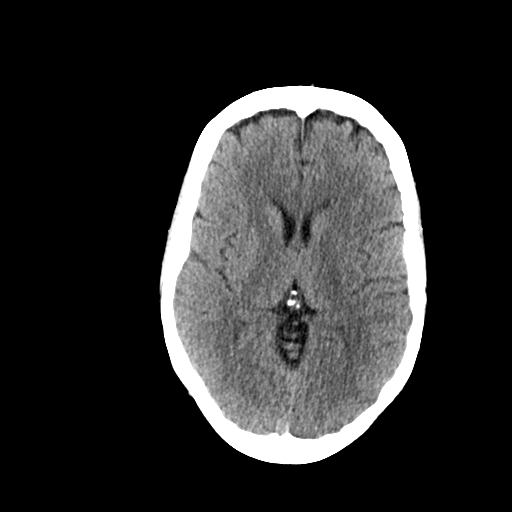
[im 18/35  brain]
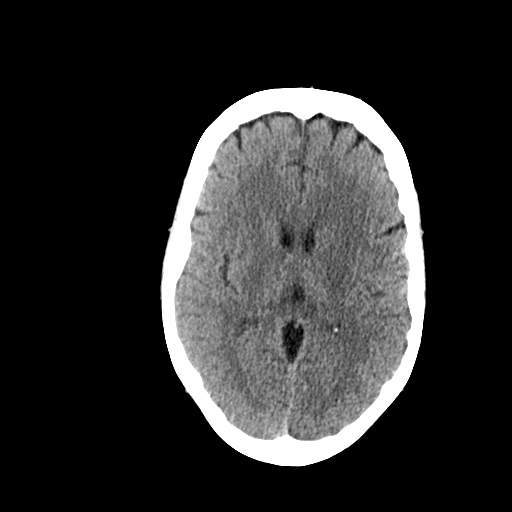
[im 18/35  bone]
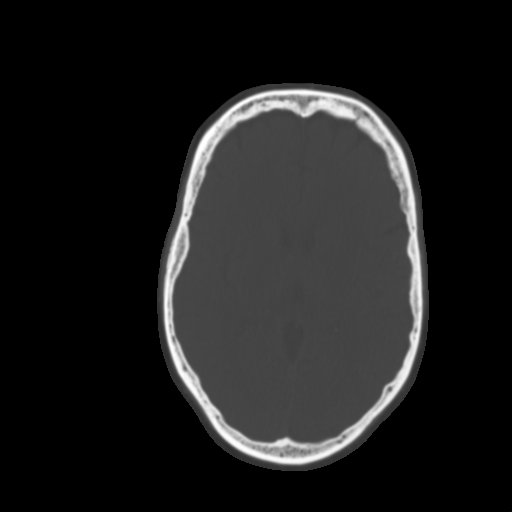
[im 20/35  brain]
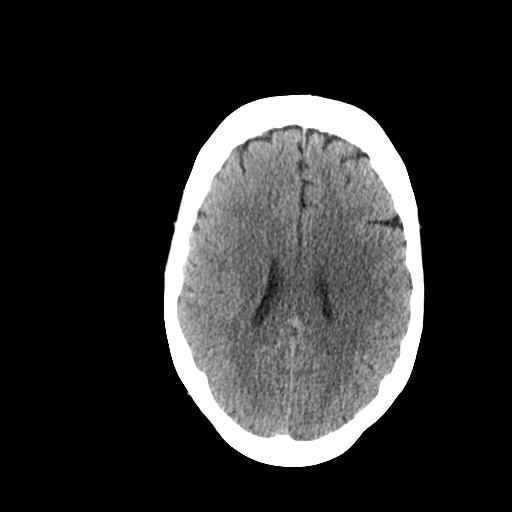
[im 23/35  brain]
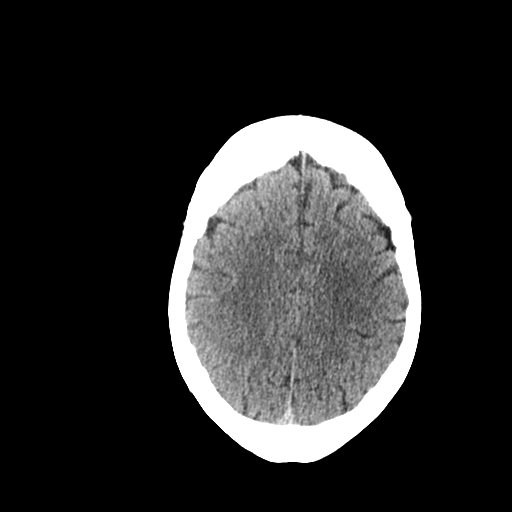
[im 25/35  brain]
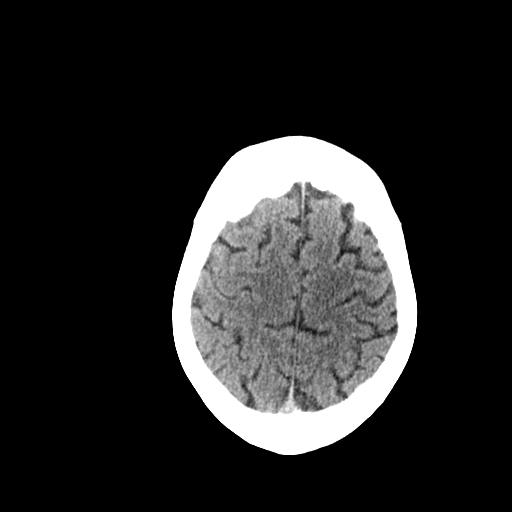
[im 26/35  brain]
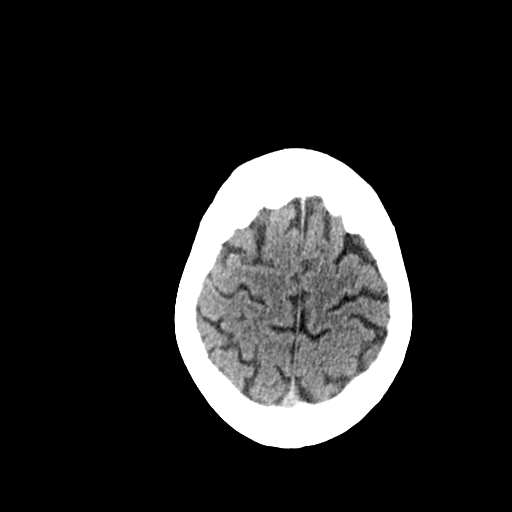
[im 26/35  bone]
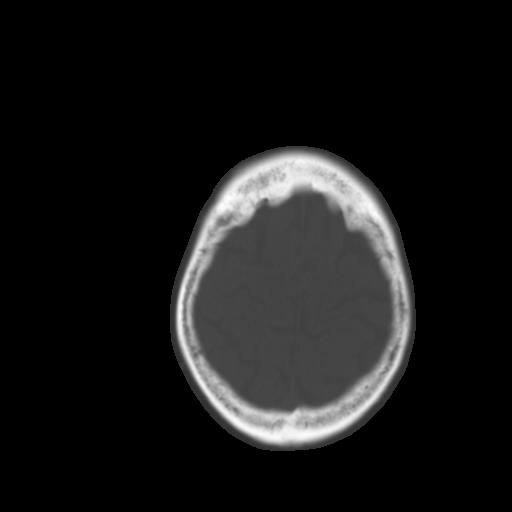
[im 29/35  brain]
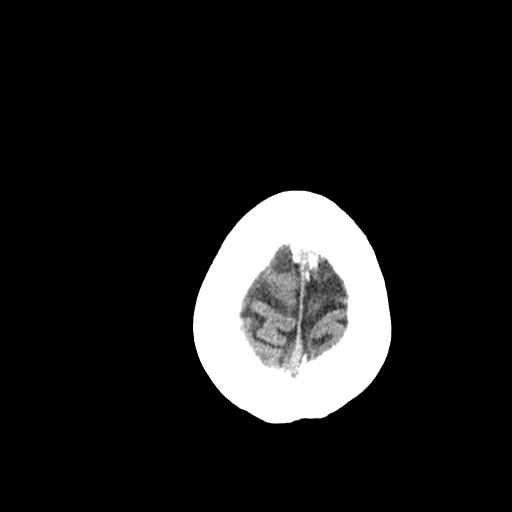
[im 31/35  brain]
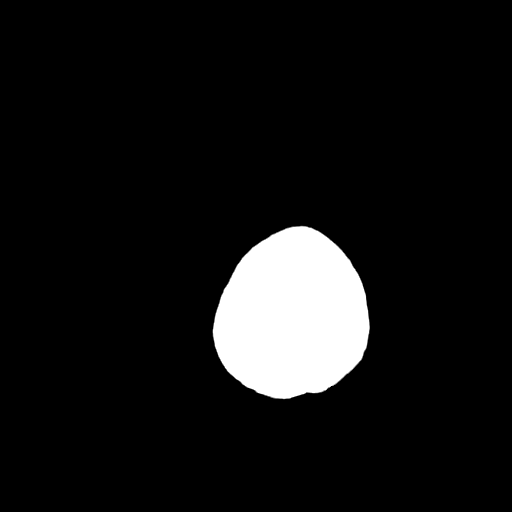
[im 33/35  brain]
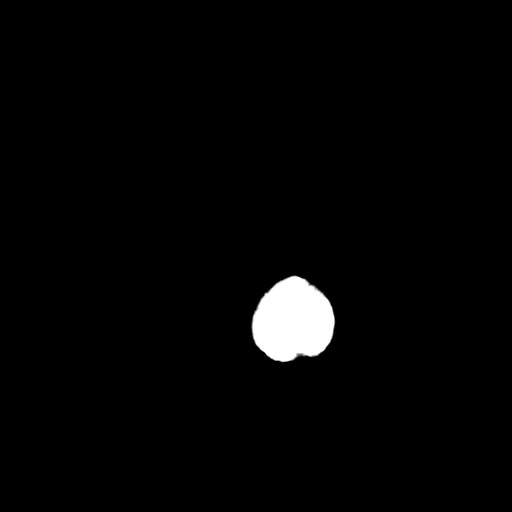

[16 of 30 positions shown; findings below may reference images not displayed]

FINDINGS: Scattered periventricular hypodensities compatible microvascular
ischemic disease. Small remote infarct within the left cerebellar
hemisphere (images 11 and 12, series 2). Given background
parenchymal abnormalities, there is no CT evidence of superimposed
acute large territory infarct. No intraparenchymal or extra-axial
mass or hemorrhage. Normal size and configuration of the ventricles
and basilar cisterns. No midline shift. Intracranial
atherosclerosis. Limited visualization the paranasal sinuses and
mastoid air cells is normal. No air-fluid levels. Regional soft
tissues appear normal. No displaced calvarial fracture.
IMPRESSION: Small remote left cerebellar infarct and microvascular ischemic
disease without acute intracranial process.

## 2015-04-06 ENCOUNTER — Emergency Department (HOSPITAL_COMMUNITY)
Admission: EM | Admit: 2015-04-06 | Discharge: 2015-04-06 | Disposition: A | Discharge status: Home / Self Care | Payer: Medicare HMO | Attending: Emergency Medicine | Admitting: Emergency Medicine

## 2015-04-06 ENCOUNTER — Emergency Department (HOSPITAL_BASED_OUTPATIENT_CLINIC_OR_DEPARTMENT_OTHER): Admit: 2015-04-06 | Discharge: 2015-04-06 | Disposition: A | Discharge status: Home / Self Care | Payer: Medicare HMO

## 2015-04-06 ENCOUNTER — Encounter (HOSPITAL_COMMUNITY): Payer: Self-pay | Admitting: *Deleted

## 2015-04-06 DIAGNOSIS — R252 Cramp and spasm: Secondary | ICD-10-CM | POA: Diagnosis not present

## 2015-04-06 DIAGNOSIS — M25472 Effusion, left ankle: Secondary | ICD-10-CM | POA: Diagnosis not present

## 2015-04-06 DIAGNOSIS — Z79899 Other long term (current) drug therapy: Secondary | ICD-10-CM | POA: Insufficient documentation

## 2015-04-06 DIAGNOSIS — Z87891 Personal history of nicotine dependence: Secondary | ICD-10-CM | POA: Insufficient documentation

## 2015-04-06 DIAGNOSIS — E876 Hypokalemia: Secondary | ICD-10-CM | POA: Diagnosis not present

## 2015-04-06 DIAGNOSIS — I10 Essential (primary) hypertension: Secondary | ICD-10-CM | POA: Insufficient documentation

## 2015-04-06 DIAGNOSIS — M79605 Pain in left leg: Secondary | ICD-10-CM | POA: Insufficient documentation

## 2015-04-06 DIAGNOSIS — M25471 Effusion, right ankle: Secondary | ICD-10-CM | POA: Insufficient documentation

## 2015-04-06 DIAGNOSIS — Z7982 Long term (current) use of aspirin: Secondary | ICD-10-CM | POA: Diagnosis not present

## 2015-04-06 DIAGNOSIS — R609 Edema, unspecified: Secondary | ICD-10-CM

## 2015-04-06 DIAGNOSIS — Z9889 Other specified postprocedural states: Secondary | ICD-10-CM | POA: Insufficient documentation

## 2015-04-06 DIAGNOSIS — Z86718 Personal history of other venous thrombosis and embolism: Secondary | ICD-10-CM | POA: Insufficient documentation

## 2015-04-06 DIAGNOSIS — T502X5A Adverse effect of carbonic-anhydrase inhibitors, benzothiadiazides and other diuretics, initial encounter: Secondary | ICD-10-CM

## 2015-04-06 LAB — CBC
HEMATOCRIT: 34.3 % — AB (ref 36.0–46.0)
Hemoglobin: 11.7 g/dL — ABNORMAL LOW (ref 12.0–15.0)
MCH: 32.4 pg (ref 26.0–34.0)
MCHC: 34.1 g/dL (ref 30.0–36.0)
MCV: 95 fL (ref 78.0–100.0)
PLATELETS: 192 10*3/uL (ref 150–400)
RBC: 3.61 MIL/uL — ABNORMAL LOW (ref 3.87–5.11)
RDW: 12.4 % (ref 11.5–15.5)
WBC: 4.8 10*3/uL (ref 4.0–10.5)

## 2015-04-06 LAB — BASIC METABOLIC PANEL
ANION GAP: 8 (ref 5–15)
BUN: 9 mg/dL (ref 6–20)
CO2: 26 mmol/L (ref 22–32)
Calcium: 9.3 mg/dL (ref 8.9–10.3)
Chloride: 104 mmol/L (ref 101–111)
Creatinine, Ser: 0.75 mg/dL (ref 0.44–1.00)
GFR calc Af Amer: >60 mL/min (ref >60)
Glucose, Bld: 121 mg/dL — ABNORMAL HIGH (ref 65–99)
Potassium: 3.4 mmol/L — ABNORMAL LOW (ref 3.5–5.1)
SODIUM: 138 mmol/L (ref 135–145)

## 2015-04-06 MED ORDER — POTASSIUM CHLORIDE CRYS ER 20 MEQ PO TBCR
40.0000 meq | EXTENDED_RELEASE_TABLET | Freq: Once | ORAL | Status: AC
  Administered 2015-04-06: 40 meq via ORAL
  Filled 2015-04-06: qty 2

## 2015-04-06 NOTE — ED Notes (Signed)
Pt reports pain to left lower leg for extended amount of time, feels like a cramp. Pt has swelling to both legs, states that is normal for her. Denies any increase in swelling or redness to her leg. Ambulatory at triage.

## 2015-04-06 NOTE — Discharge Instructions (Signed)
1. Medications: usual home medications 2. Treatment: rest, drink plenty of fluids, use Tylenol for pain relief. 3. Follow Up: Please followup with your primary doctor in 3 days for discussion of your diagnoses and further evaluation after today's visit; Please return to the ER for fever, chills, worsening pain, inability to walk, other concerns   Arthralgia Your caregiver has diagnosed you as suffering from an arthralgia. Arthralgia means there is pain in a joint. This can come from many reasons including:  Bruising the joint which causes soreness (inflammation) in the joint.  Wear and tear on the joints which occur as we grow older (osteoarthritis).  Overusing the joint.  Various forms of arthritis.  Infections of the joint. Regardless of the cause of pain in your joint, most of these different pains respond to anti-inflammatory drugs and rest. The exception to this is when a joint is infected, and these cases are treated with antibiotics, if it is a bacterial infection. HOME CARE INSTRUCTIONS   Rest the injured area for as long as directed by your caregiver. Then slowly start using the joint as directed by your caregiver and as the pain allows. Crutches as directed may be useful if the ankles, knees or hips are involved. If the knee was splinted or casted, continue use and care as directed. If an stretchy or elastic wrapping bandage has been applied today, it should be removed and re-applied every 3 to 4 hours. It should not be applied tightly, but firmly enough to keep swelling down. Watch toes and feet for swelling, bluish discoloration, coldness, numbness or excessive pain. If any of these problems (symptoms) occur, remove the ace bandage and re-apply more loosely. If these symptoms persist, contact your caregiver or return to this location.  For the first 24 hours, keep the injured extremity elevated on pillows while lying down.  Apply ice for 15-20 minutes to the sore joint every  couple hours while awake for the first half day. Then 03-04 times per day for the first 48 hours. Put the ice in a plastic bag and place a towel between the bag of ice and your skin.  Wear any splinting, casting, elastic bandage applications, or slings as instructed.  Only take over-the-counter or prescription medicines for pain, discomfort, or fever as directed by your caregiver. Do not use aspirin immediately after the injury unless instructed by your physician. Aspirin can cause increased bleeding and bruising of the tissues.  If you were given crutches, continue to use them as instructed and do not resume weight bearing on the sore joint until instructed. Persistent pain and inability to use the sore joint as directed for more than 2 to 3 days are warning signs indicating that you should see a caregiver for a follow-up visit as soon as possible. Initially, a hairline fracture (break in bone) may not be evident on X-rays. Persistent pain and swelling indicate that further evaluation, non-weight bearing or use of the joint (use of crutches or slings as instructed), or further X-rays are indicated. X-rays may sometimes not show a small fracture until a week or 10 days later. Make a follow-up appointment with your own caregiver or one to whom we have referred you. A radiologist (specialist in reading X-rays) may read your X-rays. Make sure you know how you are to obtain your X-ray results. Do not assume everything is normal if you do not hear from [place]. SEEK MEDICAL CARE IF: Bruising, swelling, or pain increases. SEEK IMMEDIATE MEDICAL CARE IF:  Your fingers or toes are numb or blue.  The pain is not responding to medications and continues to stay the same or get worse.  The pain in your joint becomes severe.  You develop a fever over 102 F (38.9 C).  It becomes impossible to move or use the joint. MAKE SURE YOU:   Understand these instructions.  Will watch your condition.  Will get help  right away if you are not doing well or get worse. Document Released: 08/27/2005 Document Revised: 11/19/2011 Document Reviewed: 04/14/2008 [facility] Patient Information 2015 [place], [place]. This information is not intended to replace advice given to you by your health care provider. Make sure you discuss any questions you have with your health care provider.

## 2015-04-06 NOTE — Progress Notes (Signed)
*  PRELIMINARY RESULTS* Vascular Ultrasound Lower extremity venous duplex has been completed.  Preliminary findings: no evidence of DVT in visualized veins.   Landry Mellow, RDMS, RVT  04/06/2015, 5:27 PM

## 2015-04-06 NOTE — ED Provider Notes (Signed)
CSN: [id number]     Arrival date & time 04/06/15  1431 History   First MD Initiated Contact with Patient 04/06/15 1630     Chief Complaint  Patient presents with  . Leg Pain     (Consider location/radiation/quality/duration/timing/severity/associated sxs/prior Treatment) The history is provided by the patient and medical records. No language interpreter was used.     Sheila Jacobs is a 64 y.o. female  with a hx of HTN (on HCTZ), hyperlipidemia, obesity presents to the Emergency Department complaining of gradual, persistent, progressively worsening left lower leg throbbing pain onset worst in the posterior calf and lateral lower leg.  She reports more swelling in the left leg than in the right, but there is baseline LE edema.  Pt endorses a very sedentary lifestyle without long travel, hx of DVT, exogenous estrogen, recent surgery or broken bones. Associated symptoms include edema to the BLE.  Pt reports taking ibuprofen with moderate relief for the duration of the medication.  Nothing else makes it better and nothing makes the symptoms worse.  Pt denies fever, chills, headache, neck pain, chest pain, SOB, abd pain, N/V/D, weakness, dizziness, syncope, dysuria.  Pt specifically denies any back pain, posterior thigh pain, falls, IVDU, anticoagulants or hx of cancer.     Past Medical History  Diagnosis Date  . Hypertension   . Enlarged LA (left atrium)   . Hyperlipidemia   . Obesity   . Heavy cigarette smoker     Quit 2015  . Morbid obesity    Past Surgical History  Procedure Laterality Date  . Tubal ligation    . Left heart catheterization with coronary angiogram N/A 10/21/2014    Procedure: LEFT HEART CATHETERIZATION WITH CORONARY ANGIOGRAM;  Surgeon: Burnell Blanks, MD;  Location: [facility] CATH LAB;  Service: Cardiovascular;  Laterality: N/A;   Family History  Problem Relation Age of Onset  . Hypertension Mother   . Heart disease Mother     Pacemaker  . Kidney disease Mother     History  Substance Use Topics  . Smoking status: Former Smoker -- 1.00 packs/day for 45 years    Types: Cigarettes    Quit date: 08/06/2014  . Smokeless tobacco: Not on file  . Alcohol Use: No   OB History    No data available     Review of Systems  Constitutional: Negative for fever, diaphoresis, appetite change, fatigue and unexpected weight change.  HENT: Negative for mouth sores.   Eyes: Negative for visual disturbance.  Respiratory: Negative for cough, chest tightness, shortness of breath and wheezing.   Cardiovascular: Positive for leg swelling. Negative for chest pain.  Gastrointestinal: Negative for nausea, vomiting, abdominal pain, diarrhea and constipation.  Endocrine: Negative for polydipsia, polyphagia and polyuria.  Genitourinary: Negative for dysuria, urgency, frequency and hematuria.  Musculoskeletal: Positive for arthralgias (left leg). Negative for back pain and neck stiffness.  Skin: Negative for rash.  Allergic/Immunologic: Negative for immunocompromised state.  Neurological: Negative for syncope, light-headedness and headaches.  Hematological: Does not bruise/bleed easily.  Psychiatric/Behavioral: Negative for sleep disturbance. The patient is not nervous/anxious.       Allergies  Cortisone  Home Medications   Prior to Admission medications   Medication Sig Start Date End Date Taking? Authorizing Provider  aspirin 81 MG tablet Take 81 mg by mouth daily.   Yes Historical Provider, MD  calcium citrate-vitamin D (CITRACAL+D) 315-200 MG-UNIT per tablet Take 1 tablet by mouth daily.   Yes Historical Provider, MD  hydrochlorothiazide (  HYDRODIURIL) 25 MG tablet Take 25 mg by mouth daily.   Yes Historical Provider, MD  omeprazole (PRILOSEC) 20 MG capsule Take 1 capsule (20 mg total) by mouth daily. Patient not taking: Reported on 04/06/2015 09/20/14   Bynum Bellows, MD   BP 131/81 mmHg  Pulse 58  Temp(Src) 98 F (36.7 C) (Oral)  Resp 13  Ht 5\' 4"   (1.626 m)  Wt 277 lb 11.2 oz (125.964 kg)  BMI 47.64 kg/m2  SpO2 100% Physical Exam  Constitutional: She appears well-developed and well-nourished. No distress.  Awake, alert, nontoxic appearance Obese  HENT:  Head: Normocephalic and atraumatic.  Mouth/Throat: Oropharynx is clear and moist. No oropharyngeal exudate.  Eyes: Conjunctivae are normal. No scleral icterus.  Neck: Normal range of motion. Neck supple.  Cardiovascular: Normal rate, regular rhythm, normal heart sounds and intact distal pulses.   Pulmonary/Chest: Effort normal and breath sounds normal. No respiratory distress. She has no wheezes.  Equal chest expansion  Abdominal: Soft. Bowel sounds are normal. She exhibits no mass. There is no tenderness. There is no rebound and no guarding.  Musculoskeletal: Normal range of motion. She exhibits edema.  2+ pitting edema of the ankles with 1+ pitting edema of the bilateral calves TTP of the left lateral and posterior calf TTP without swelling, ecchymosis or deformity of the posterior knee and popliteal space; no palpable bakers cyst FROM of the left knee without complaints of only minimal discomfort Patient ambulates with steady gait  Neurological: She is alert.  Speech is clear and goal oriented Moves extremities without ataxia 5/5 strength in the BLE including hips, knees and ankles  Skin: Skin is warm and dry. She is not diaphoretic.  Psychiatric: She has a normal mood and affect.  Nursing note and vitals reviewed.   ED Course  Procedures (including critical care time) Labs Review Labs Reviewed  CBC - Abnormal; Notable for the following:    RBC 3.61 (*)    Hemoglobin 11.7 (*)    HCT 34.3 (*)    All other components within normal limits  BASIC METABOLIC PANEL - Abnormal; Notable for the following:    Potassium 3.4 (*)    Glucose, Bld 121 (*)    All other components within normal limits    Imaging Review No results found.   EKG Interpretation None       MDM   Final diagnoses:  Diuretic-induced hypokalemia  Muscle cramps  Left leg pain   Sheila Jacobs presents with c/o BLE cramping and left leg pain x 1 month.  Pt with sedentary lifestyle. Concern for DVT.  Patient with tenderness to palpation of her knee as well. Patient potentially with arthritis secondary to her weight. Will give Tylenol and reassess. Venous duplex pending. Patient is taking HCTZ. Will check electrolytes to ensure no hypokalemia as the source of her muscle cramps.  No falls or known injury, highly doubt fracture. No indication for x-ray at this time.  6:43 PM Venous duplex without evidence of DVT bilaterally. Patient with mild hypokalemia at 3.4. Repleted here in the emergency department. Patient is able to bear weight on her knee and has no weakness in her bilateral lower extremities.  Doubt origin of pain from her back.  Patient is to follow-up with her primary care physician for further evaluation of her pain. Recommend that she use Tylenol at home as this gave her better pain relief urine the emergency department at her home ibuprofen has. Discussed toxic dose of Tylenol. Patient  is well-appearing and in no acute distress at this time. She'll be discharged home with PCP follow-up.  BP 131/81 mmHg  Pulse 58  Temp(Src) 98 F (36.7 C) (Oral)  Resp 13  Ht 5\' 4"  (1.626 m)  Wt 277 lb 11.2 oz (125.964 kg)  BMI 47.64 kg/m2  SpO2 100%  The patient was discussed with and seen by Dr. Venora Maples who agrees with the treatment plan.   Jarrett Soho Cadence Minton, PA-C 04/06/15 1859  Jola Schmidt, MD 04/06/15 1900

## 2015-04-11 ENCOUNTER — Telehealth: Payer: Self-pay | Admitting: Cardiology

## 2015-04-11 NOTE — Telephone Encounter (Signed)
Spoke with patient. She states she went to ED last week and was told her potassium was low. No med changes were made at ED visit. She would [facility] Rx for potassium. She states Dr. [name] call her 15-20 minutes ago and told her he would call in her Rx for her. Asked her what dose and she stated what pharmacy this Rx needs to go to.   Informed her that I cannot prescribe medication and that an MD has to review her information and make that call. She voice understanding.    Routed to Dr. [name] to review/advise

## 2015-04-11 NOTE — Telephone Encounter (Signed)
Pt went to the ER on Wednesday of last week at [facility]. They told her that her potassium was low. She wants some potassium medicine called in.Her medical doctor is out of town until 04-28-15,she needs it before that time . Please call this to [facility]-[phone](she is not sure of this phone number)Please call this in asap,she does not have any.

## 2015-04-12 NOTE — Telephone Encounter (Signed)
Pt said she gave the wrong phone number for [facility]. The correct number is [phone].

## 2015-04-13 DIAGNOSIS — H903 Sensorineural hearing loss, bilateral: Secondary | ICD-10-CM | POA: Diagnosis not present

## 2015-04-17 NOTE — Telephone Encounter (Signed)
This patient needs to have potassium prescribed by her primary provider.  She has had a complete cardiac work up and does not have significant vascular disease.  She does not need a return appt in this clinic and meds should be prescribed by the PCP.

## 2015-04-18 NOTE — Telephone Encounter (Signed)
Leave message informing pt she need to get her potassium from her PCP as per Dr [name]

## 2015-04-19 ENCOUNTER — Ambulatory Visit (INDEPENDENT_AMBULATORY_CARE_PROVIDER_SITE_OTHER): Payer: Medicare Other | Admitting: Family Medicine

## 2015-04-19 ENCOUNTER — Encounter: Payer: Self-pay | Admitting: Family Medicine

## 2015-04-19 VITALS — BP 128/82 | HR 64 | Ht 63.0 in | Wt 276.0 lb

## 2015-04-19 DIAGNOSIS — E876 Hypokalemia: Secondary | ICD-10-CM | POA: Diagnosis not present

## 2015-04-19 DIAGNOSIS — I10 Essential (primary) hypertension: Secondary | ICD-10-CM | POA: Diagnosis not present

## 2015-04-19 NOTE — Progress Notes (Signed)
   Subjective:    Patient ID: Sheila Jacobs, female    DOB: July 25, 1951, 64 y.o.   MRN: [id number]  HPI Pt states she is here because she was told by the ED that her potassium level was low. She has been taking an otc low dose potassium supplement. She also states she would like to get some pills for her "nerves" and to help her sleep. She reports that her blood pressure medicine is making her urinate a lot but that she is ok with that since the last time her provider tried to reduce the dose she had more problems with her legs swelling. She denies fever, chills, shortness of breath, n/v/d, chest pain or palpitations. She states her cardiologist recently told her she did not have to come back that her heart was "ok".  She lives with her daughter and states she is not currently very active.   Review of Systems Review of Systems Constitutional: -fever, -chills, -sweats, -unexpected weight change,-fatigue Cardiology:  -chest pain, -palpitations, +bilateral LE edema Respiratory: -cough, -shortness of breath, -wheezings Musculoskeletal: -arthralgias, +myalgias, -joint swelling, -back pain Neurology: -headache, -weakness, -tingling, -numbness       Objective:   Physical Exam  Alert and in no distress.  Cardiac exam shows a regular sinus rhythm without murmurs or gallops. Lungs are clear to auscultation. Bilateral lower extremities with 1+ edema.        Assessment & Plan:  Hypokalemia  Essential hypertension  Discussed that her potassium level was marginally low at 3.4 and that she should continue taking the otc potassium supplement. I recommended getting a serum potassium level today however due to transportation issues and time constraint, the patient refused. She agreed to schedule an appointment in 1-2 weeks in order for this to be obtained. Also recommended a reduction in her HCTZ from 25mg  to 12.5 due to low potassium level, however she did not want to do this today. She will also sign  a release today so that we may get her records from her previous provider.

## 2015-04-19 NOTE — Patient Instructions (Signed)
Continue taking low dose potassium and return for recheck and labs in 2 weeks. Sign release so that we may get your records from your previous provider.

## 2015-04-25 ENCOUNTER — Telehealth: Payer: Self-pay | Admitting: Family Medicine

## 2015-04-25 NOTE — Telephone Encounter (Signed)
Records received from previous provider. I am sending back for review. Please note what needs to be scanned or abstracted.

## 2015-04-29 ENCOUNTER — Encounter: Payer: Self-pay | Admitting: Family Medicine

## 2015-05-02 ENCOUNTER — Encounter: Payer: Self-pay | Admitting: Family Medicine

## 2015-05-03 ENCOUNTER — Encounter: Payer: Self-pay | Admitting: Family Medicine

## 2015-05-03 ENCOUNTER — Other Ambulatory Visit: Payer: Self-pay | Admitting: Family Medicine

## 2015-05-03 ENCOUNTER — Ambulatory Visit (INDEPENDENT_AMBULATORY_CARE_PROVIDER_SITE_OTHER): Payer: Medicare Other | Admitting: Family Medicine

## 2015-05-03 VITALS — BP 124/70 | HR 72 | Wt 272.8 lb

## 2015-05-03 DIAGNOSIS — Z1159 Encounter for screening for other viral diseases: Secondary | ICD-10-CM

## 2015-05-03 DIAGNOSIS — I10 Essential (primary) hypertension: Secondary | ICD-10-CM | POA: Diagnosis not present

## 2015-05-03 DIAGNOSIS — Z8639 Personal history of other endocrine, nutritional and metabolic disease: Secondary | ICD-10-CM

## 2015-05-03 DIAGNOSIS — Z1211 Encounter for screening for malignant neoplasm of colon: Secondary | ICD-10-CM

## 2015-05-03 DIAGNOSIS — E876 Hypokalemia: Secondary | ICD-10-CM | POA: Diagnosis not present

## 2015-05-03 DIAGNOSIS — R609 Edema, unspecified: Secondary | ICD-10-CM | POA: Insufficient documentation

## 2015-05-03 DIAGNOSIS — K219 Gastro-esophageal reflux disease without esophagitis: Secondary | ICD-10-CM | POA: Insufficient documentation

## 2015-05-03 DIAGNOSIS — Z87898 Personal history of other specified conditions: Secondary | ICD-10-CM

## 2015-05-03 LAB — LIPID PANEL
CHOLESTEROL: 174 mg/dL (ref 125–200)
HDL: 61 mg/dL (ref >=46)
LDL CALC: 100 mg/dL (ref <130)
Total CHOL/HDL Ratio: 2.9 Ratio (ref <=5.0)
Triglycerides: 65 mg/dL (ref <150)
VLDL: 13 mg/dL (ref <30)

## 2015-05-03 LAB — COMPREHENSIVE METABOLIC PANEL
ALBUMIN: 3.7 g/dL (ref 3.6–5.1)
ALT: 14 U/L (ref 6–29)
AST: 19 U/L (ref 10–35)
Alkaline Phosphatase: 69 U/L (ref 33–130)
BILIRUBIN TOTAL: 0.5 mg/dL (ref 0.2–1.2)
BUN: 10 mg/dL (ref 7–25)
CO2: 29 mmol/L (ref 20–31)
CREATININE: 0.65 mg/dL (ref 0.50–0.99)
Calcium: 9.8 mg/dL (ref 8.6–10.4)
Chloride: 101 mmol/L (ref 98–110)
Glucose, Bld: 98 mg/dL (ref 65–99)
Potassium: 3.1 mmol/L — ABNORMAL LOW (ref 3.5–5.3)
SODIUM: 138 mmol/L (ref 135–146)
TOTAL PROTEIN: 7.4 g/dL (ref 6.1–8.1)

## 2015-05-03 LAB — POCT GLYCOSYLATED HEMOGLOBIN (HGB A1C): Hemoglobin A1C: 4.9

## 2015-05-03 MED ORDER — OMEPRAZOLE 20 MG PO CPDR: 20.0000 mg | DELAYED_RELEASE_CAPSULE | Freq: Every day | ORAL | Status: DC

## 2015-05-03 NOTE — Progress Notes (Signed)
Subjective:    Patient ID: Sheila Jacobs, female    DOB: 1951/01/24, 64 y.o.   MRN: [id number]  HPI She is here for a follow up on hypokalemia and has other complaints as well. She has been taking her over-the-counter potassium supplement daily. She states she has been feeling pretty well and has been trying to eat a low-salt diet. She states this is difficult since most of her meals or with her daughter cooking and her daughter cooks with quite a bit of salt. She complains of brief intermittent sharp pains in her upper chest after eating. She states these pains last about a minute and then they go away. She does not notice pain in her chest any other time of day or night. She denies palpitations, heart racing, shortness of breath, dyspnea on exertion, dizziness, cough. She also denies fever, chills, nausea, vomiting, diarrhea, urinary issues, unusual joint or muscle aches. She states she had an issue with reflux in the past and was taking medication for this. However, she has not taken any medication for reflux in several months and she thinks this is flaring up again.   She also is complaining about left lateral lower leg discomfort that she states is related to a cortisone injection she received in 2002. She mentioned numerous times that her leg pain has been present since the cortisone injection. She states she takes Tylenol 1000 mg as needed for aches and pains.  She stays with her daughter often but also states she has a place of her own. She does not drive and utilizes public transportation.  States her last mammogram was last year and normal. States her last Pap smear was normal and that she has never had an abnormal Pap smear. Reports having a colonoscopy at least 10 years ago or longer. She saw her eye doctor last year. She has not seen a dentist in quite some time. She states she will get her flu shot and a couple of months, does not want this today. She does not recall ever having  pneumonia vaccine or shingles vaccine. She has a little understanding of what these are for.    Review of Systems    pertinent positives and negatives are in the history of present illness. Objective:   Physical Exam  Constitutional: She is oriented to person, place, and time. Vital signs are normal. She appears well-developed and well-nourished. No distress.  Cardiovascular: Normal rate, regular rhythm and normal heart sounds.   Pulmonary/Chest: Effort normal and breath sounds normal. She has no wheezes. She has no rhonchi.  Neurological: She is alert and oriented to person, place, and time.  Psychiatric: She has a normal mood and affect. Her speech is normal and behavior is normal. Judgment and thought content normal. Cognition and memory are normal.    BP 124/70 mmHg  Pulse 72  Wt 272 lb 12.8 oz (123.741 kg)     Hgb A1C 4.9     Assessment & Plan:  Hypokalemia - Plan: Comprehensive metabolic panel  Essential hypertension  Morbid obesity - Plan: Lipid panel, POCT glycosylated hemoglobin (Hb A1C)  Gastroesophageal reflux disease, esophagitis presence not specified - Plan: omeprazole (PRILOSEC) 20 MG capsule  Need for hepatitis C screening test - Plan: Hepatitis C antibody  History of impaired glucose tolerance - Plan: POCT glycosylated hemoglobin (Hb A1C)  Special screening for malignant neoplasms, colon - Plan: Ambulatory referral to Gastroenterology  History of hyperlipidemia - Plan: Lipid panel  She has lost 3  pounds since her last visit. Her blood pressure today is normal, she will continue on current blood pressure medication.. I congratulated her on attempting to eat a low-salt diet and losing weight. Encouraged her to continue eating healthy and walking as often as possible She states she had a colonoscopy about 10 years ago with [facility]. However, after Sabrina,CMA called [facility] they have no documentation of her ever having had a colonoscopy. We will refer her for  a colonoscopy. Explained to her that she would need to have someone take her for her procedure. She states she will try to schedule it when her daughter can take her.  Discussed that once her labs are resulted, if her potassium is still low that I will prescribe a potassium supplement for her. Also discussed starting her on reflux medication. Educated her on not eating 3 hours before bedtime, eating small frequent meals as opposed to large meals, and recognizing foods that give her difficulty with reflux.   Screened her for diabetes today due to the fact that she had more than 1 abnormal blood glucose result in the past year. Her hemoglobin A1c indicates that she does not have diabetes or prediabetes. I also discussed this with her.  Will screen her for hepatitis C per guidelines. Will follow-up pending lab results.

## 2015-05-03 NOTE — Patient Instructions (Addendum)
Keep eating a low salt diet and eating grilled and broiled foods. Since she would like to come back in a couple of months for the flu shot I would like you to consider getting the pneumonia vaccine and shingles vaccine.   We will recheck your potassium level today. If it is low I will call in a potassium supplement and we will let you know as well.  Continue taking your blood pressure medication your blood pressure today is normal. Also you can continue using Tylenol for aches and pains.  To control your reflux symptoms do not eat at least 3 hours before going to bed. Try eating small frequent meals as opposed to large heavy meals. Also try to recognize your triggers as far as what types of foods make you have reflux. I am prescribing omeprazole today, take it once daily in the morning at least 30 minutes before breakfast.

## 2015-05-04 ENCOUNTER — Other Ambulatory Visit: Payer: Self-pay | Admitting: Family Medicine

## 2015-05-04 DIAGNOSIS — E876 Hypokalemia: Secondary | ICD-10-CM

## 2015-05-04 LAB — MAGNESIUM: MAGNESIUM: 2 mg/dL (ref 1.5–2.5)

## 2015-05-04 LAB — HEPATITIS C ANTIBODY: HCV Ab: NEGATIVE

## 2015-05-04 MED ORDER — POTASSIUM CHLORIDE CRYS ER 20 MEQ PO TBCR: 20.0000 meq | EXTENDED_RELEASE_TABLET | Freq: Every day | ORAL | Status: DC

## 2015-05-09 ENCOUNTER — Telehealth: Payer: Self-pay | Admitting: Family Medicine

## 2015-05-09 NOTE — Telephone Encounter (Signed)
Please call her and ask her if she has a way to check her blood pressure. See if she has eaten today too. Is her chest pain different than in the past? Please ask her more questions about her chest pain...when does it hurt and how long does it last?

## 2015-05-09 NOTE — Telephone Encounter (Signed)
Please let her know that I reviewed the note from the Cardiologist and they basically ruled out the pain in her chest from being related to something dangerous. As far as the dose of potassium is concerned, she is not on a large dose at all and in fact may still be low. See if she can come in tomorrow so that we can further examine her and check her potassium level if needed.

## 2015-05-09 NOTE — Telephone Encounter (Signed)
Pt was notified to take 1/2 potassium and drink water and then see if the chest pain ease up, if not she needs to come in for an appt to get futher evaluated. Pt states she took pill on Saturday and had chest pain and light headedness and then didn't take it Sunday and was fine and then took it today and was having pain.

## 2015-05-09 NOTE — Telephone Encounter (Signed)
Pt left a message with the answering service and I called her back. Pt states that she is having a reaction to new medication. She states she is feeling light headed and having chest pains. She thinks they are too strong. Please call pt and advise. Pt can be reached at [phone] and uses [facility] at [address]

## 2015-05-09 NOTE — Telephone Encounter (Signed)
Please put more info in

## 2015-05-23 ENCOUNTER — Telehealth: Payer: Self-pay | Admitting: Family Medicine

## 2015-05-23 DIAGNOSIS — R252 Cramp and spasm: Secondary | ICD-10-CM

## 2015-05-23 NOTE — Telephone Encounter (Signed)
Please call her and see if she is having a different problem with her foot or what exactly her complaint is. I know she has had an issue for a while and I would like to know if she is having a different issue. Basically, what does she think she needs to see a foot doctor about?

## 2015-05-23 NOTE — Telephone Encounter (Signed)
Ok. Please make referral. She has been persistent about wanting to see them. Thanks

## 2015-05-23 NOTE — Telephone Encounter (Signed)
Please call , she left voice mail message that she needs referral to foot doctor

## 2015-05-23 NOTE — Telephone Encounter (Signed)
Pt states that she is having foot cramping and feels like it may be arthritis. Pt had cortisone shots back in 2000 in her feet but due to her insurance medicaid she has to have a referral. Pt wants to be referred to [facility]

## 2015-05-23 NOTE — Telephone Encounter (Signed)
I have put referral into [facility] and they should pick it up

## 2015-05-27 ENCOUNTER — Ambulatory Visit (INDEPENDENT_AMBULATORY_CARE_PROVIDER_SITE_OTHER): Payer: Medicare Other | Admitting: Family Medicine

## 2015-05-27 ENCOUNTER — Encounter: Payer: Self-pay | Admitting: Family Medicine

## 2015-05-27 VITALS — BP 124/76 | HR 64 | Temp 98.0°F | Wt 272.8 lb

## 2015-05-27 DIAGNOSIS — E876 Hypokalemia: Secondary | ICD-10-CM

## 2015-05-27 DIAGNOSIS — R609 Edema, unspecified: Secondary | ICD-10-CM

## 2015-05-27 DIAGNOSIS — K219 Gastro-esophageal reflux disease without esophagitis: Secondary | ICD-10-CM

## 2015-05-27 LAB — BASIC METABOLIC PANEL
BUN: 11 mg/dL (ref 7–25)
CO2: 27 mmol/L (ref 20–31)
CREATININE: 0.72 mg/dL (ref 0.50–0.99)
Calcium: 9.3 mg/dL (ref 8.6–10.4)
Chloride: 102 mmol/L (ref 98–110)
Glucose, Bld: 125 mg/dL — ABNORMAL HIGH (ref 65–99)
Potassium: 3.3 mmol/L — ABNORMAL LOW (ref 3.5–5.3)
Sodium: 139 mmol/L (ref 135–146)

## 2015-05-27 NOTE — Patient Instructions (Signed)
[facility] [address] can fit you for compression stockings, let me know if you want me to write a prescription for these after you go to the foot doctor. Continue elevating your legs and as the swelling keeps going down overnight this is a good thing.  Pickup your reflux medication and start taking this. I think your irritated throat and even the cough could be a result of the stomach acid.    Gastroesophageal Reflux Disease, Adult Gastroesophageal reflux disease (GERD) happens when acid from your stomach flows up into the esophagus. When acid comes in contact with the esophagus, the acid causes soreness (inflammation) in the esophagus. Over time, GERD may create small holes (ulcers) in the lining of the esophagus. CAUSES   Increased body weight. This puts pressure on the stomach, making acid rise from the stomach into the esophagus.  Smoking. This increases acid production in the stomach.  Drinking alcohol. This causes decreased pressure in the lower esophageal sphincter (valve or ring of muscle between the esophagus and stomach), allowing acid from the stomach into the esophagus.  Late evening meals and a full stomach. This increases pressure and acid production in the stomach.  A malformed lower esophageal sphincter. Sometimes, no cause is found. SYMPTOMS   Burning pain in the lower part of the mid-chest behind the breastbone and in the mid-stomach area. This may occur twice a week or more often.  Trouble swallowing.  Sore throat.  Dry cough.  Asthma-like symptoms including chest tightness, shortness of breath, or wheezing. DIAGNOSIS  Your caregiver may be able to diagnose GERD based on your symptoms. In some cases, X-rays and other tests may be done to check for complications or to check the condition of your stomach and esophagus. TREATMENT  Your caregiver may recommend over-the-counter or prescription medicines to help decrease acid production. Ask your  caregiver before starting or adding any new medicines.  HOME CARE INSTRUCTIONS   Change the factors that you can control. Ask your caregiver for guidance concerning weight loss, quitting smoking, and alcohol consumption.  Avoid foods and drinks that make your symptoms worse, such as:  Caffeine or alcoholic drinks.  Chocolate.  Peppermint or mint flavorings.  Garlic and onions.  Spicy foods.  Citrus fruits, such as oranges, lemons, or limes.  Tomato-based foods such as sauce, chili, salsa, and pizza.  Fried and fatty foods.  Avoid lying down for the 3 hours prior to your bedtime or prior to taking a nap.  Eat small, frequent meals instead of large meals.  Wear loose-fitting clothing. Do not wear anything tight around your waist that causes pressure on your stomach.  Raise the head of your bed 6 to 8 inches with wood blocks to help you sleep. Extra pillows will not help.  Only take over-the-counter or prescription medicines for pain, discomfort, or fever as directed by your caregiver.  Do not take aspirin, ibuprofen, or other nonsteroidal anti-inflammatory drugs (NSAIDs). SEEK IMMEDIATE MEDICAL CARE IF:   You have pain in your arms, neck, jaw, teeth, or back.  Your pain increases or changes in intensity or duration.  You develop nausea, vomiting, or sweating (diaphoresis).  You develop shortness of breath, or you faint.  Your vomit is green, yellow, black, or looks like coffee grounds or blood.  Your stool is red, bloody, or black. These symptoms could be signs of other problems, such as heart disease, gastric bleeding, or esophageal bleeding. MAKE SURE YOU:   Understand these instructions.  Will  watch your condition.  Will get help right away if you are not doing well or get worse. Document Released: 06/06/2005 Document Revised: 11/19/2011 Document Reviewed: 03/16/2011 [facility] Patient Information 2015 [place], [place]. This information is not intended to replace  advice given to you by your health care provider. Make sure you discuss any questions you have with your health care provider.

## 2015-05-27 NOTE — Progress Notes (Signed)
Subjective:    Patient ID: Sheila Jacobs, female    DOB: 01/20/1951, 64 y.o.   MRN: [id number]  HPI She is here for left lower lateral ankle and foot pain and states the pain is ongoing for "years". She states she thinks this is due to cortisone injection she had many years ago. States the pain is worse when she is walking on her foot and states it woke her up last night throbbing. She has requested to see a foot doctor on multiple occasions and states she has an appointment later this month.  She also continues to complain of reflux symptoms and states her throat feels irritated and she has an occasional dry cough. She states she has not yet picked up her reflux medication that I prescribed. She states she will go pick this up today.  Denies fever, chills, nausea, vomiting, chest pain, palpitations. She states she does not walk around very much and that mostly she sits in the chair watching television. Reports both of her legs and feet have been swollen for a long time and that after she elevates them swelling improves. She denies any pain in her right leg or foot.  She is scheduled for a colonoscopy in November.   She states she is taking her potassium medication however she has to break them in half.    Review of Systems Pertinent positives and negatives in the history of present illness.    Objective:   Physical Exam  Constitutional: She is oriented to person, place, and time. Vital signs are normal. She appears well-developed and well-nourished. No distress.  Cardiovascular: Normal rate, regular rhythm, intact distal pulses and normal pulses.   Pulses:      Dorsalis pedis pulses are 2+ on the right side, and 2+ on the left side.  Pulmonary/Chest: Effort normal and breath sounds normal. She has no wheezes. She has no rhonchi.  Musculoskeletal:       Feet:  Neurological: She is alert and oriented to person, place, and time. She has normal strength. No sensory deficit.  Skin: Skin  is warm and dry. No bruising and no rash noted. No cyanosis. Nails show no clubbing.  Psychiatric: She has a normal mood and affect. Her speech is normal and behavior is normal. Judgment and thought content normal. Cognition and memory are normal.          Assessment & Plan:  Dependent edema  Gastroesophageal reflux disease, esophagitis presence not specified  Hypokalemia - Plan: Basic metabolic panel  Discussed that she really needs to start taking the omeprazole I prescribed and see if this helps with her reflux. Suspect that her irritated throat and possibly her intermittent cough is also result of her reflux. Provided her with written instructions about GERD and discussed in depth food triggers and that she should not eat and lie down within 2-3 hours. She does not appear to have an underlying infectious etiology. Her symptoms do not appear to be cardiac related. She did have a complete cardiac workup in recent months. She is aware that her cardiologist cleared her. Discussed that she has dependent edema, she should continue to elevate her legs and we also discussed the possibility of compression stockings. She would like to see the podiatrist prior to trying anything for her edema or left foot pain. Encouraged her to be as active as possible and to stay hydrated. Will check her potassium level today and discussed that I would let her know next week if  we needed to change the dose. I would like to see her in November for a diabetes check.

## 2015-05-31 ENCOUNTER — Encounter: Payer: Self-pay | Admitting: Family Medicine

## 2015-06-02 NOTE — Addendum Note (Signed)
Addended by: Minette Headland A on: 06/02/2015 01:30 PM   Modules accepted: Orders

## 2015-06-06 ENCOUNTER — Ambulatory Visit (INDEPENDENT_AMBULATORY_CARE_PROVIDER_SITE_OTHER): Payer: Medicare Other

## 2015-06-06 ENCOUNTER — Ambulatory Visit (INDEPENDENT_AMBULATORY_CARE_PROVIDER_SITE_OTHER): Payer: Medicare Other | Admitting: Podiatry

## 2015-06-06 VITALS — BP 157/78 | HR 73 | Resp 16 | Ht 63.0 in | Wt 272.0 lb

## 2015-06-06 DIAGNOSIS — M79673 Pain in unspecified foot: Secondary | ICD-10-CM

## 2015-06-06 DIAGNOSIS — B353 Tinea pedis: Secondary | ICD-10-CM | POA: Diagnosis not present

## 2015-06-06 DIAGNOSIS — M722 Plantar fascial fibromatosis: Secondary | ICD-10-CM | POA: Diagnosis not present

## 2015-06-06 DIAGNOSIS — R252 Cramp and spasm: Secondary | ICD-10-CM | POA: Diagnosis not present

## 2015-06-06 MED ORDER — KETOCONAZOLE 2 % EX CREA
1.0000 | TOPICAL_CREAM | Freq: Every day | CUTANEOUS | Status: DC
Start: 2015-06-06 — End: 2015-07-28

## 2015-06-06 NOTE — Patient Instructions (Signed)

## 2015-06-06 NOTE — Progress Notes (Signed)
Subjective:     Patient ID: Sheila Jacobs, female   DOB: December 21, 1950, 64 y.o.   MRN: [id number]  HPI patient presents stating I have some problems with my heels and then I gets some cramping between my toes of both feet that's been going on for a few years. I've tried soaking in Epsom salts without relief and I do have low potassium which I take medicine for   Review of Systems  All other systems reviewed and are negative.      Objective:   Physical Exam  Constitutional: She is oriented to person, place, and time.  Cardiovascular: Intact distal pulses.   Musculoskeletal: Normal range of motion.  Neurological: She is oriented to person, place, and time.  Skin: Skin is warm.  Nursing note and vitals reviewed.  neurovascular status intact muscle strength adequate with range of motion within normal limits. Patient's found to have good digital perfusion is well oriented 3 and is noted to have moderate discomfort in the plantar heel when palpated deeply. Patient has moderate depression of the arch with a history of orthotics and does have some cramping which appears to be non-nondescript in its nature with no indications of systemic disease     Assessment:     Low-level cramping which may be due to obesity and moderate flatfoot deformity along with mild inflammatory plantar fasciitis    Plan:     H&P and x-rays reviewed with patient. I've recommended physical therapy and we discussed long-term orthotics and considerations for tonic water blood work to rule out low potassium. Patient denies wanting orthotics denies wanting other treatments currently and we'll just try to wear supportive shoe gear and see whether or not this helps to alleviate symptoms

## 2015-06-06 NOTE — Progress Notes (Signed)
   Subjective:    Patient ID: Sheila Jacobs, female    DOB: 08/07/1951, 64 y.o.   MRN: [id number]  HPI Patient presents with bilateral foot pain; top of  foot and heel, cramping. This has been going on for the past 2-3 years. Tried soaking in epsom salt with no relief.   Review of Systems  Respiratory: Positive for cough.   Cardiovascular: Positive for chest pain and palpitations.  Musculoskeletal: Positive for arthralgias.  All other systems reviewed and are negative.      Objective:   Physical Exam        Assessment & Plan:

## 2015-06-07 ENCOUNTER — Encounter: Payer: Self-pay | Admitting: Family Medicine

## 2015-06-13 ENCOUNTER — Emergency Department (HOSPITAL_COMMUNITY)
Admission: EM | Admit: 2015-06-13 | Discharge: 2015-06-13 | Disposition: A | Discharge status: Home / Self Care | Payer: Medicare Other | Attending: Emergency Medicine | Admitting: Emergency Medicine

## 2015-06-13 ENCOUNTER — Other Ambulatory Visit: Payer: Self-pay

## 2015-06-13 ENCOUNTER — Encounter (HOSPITAL_COMMUNITY): Payer: Self-pay | Admitting: *Deleted

## 2015-06-13 ENCOUNTER — Emergency Department (HOSPITAL_COMMUNITY): Payer: Medicare Other

## 2015-06-13 DIAGNOSIS — I10 Essential (primary) hypertension: Secondary | ICD-10-CM | POA: Diagnosis not present

## 2015-06-13 DIAGNOSIS — R079 Chest pain, unspecified: Secondary | ICD-10-CM | POA: Diagnosis not present

## 2015-06-13 DIAGNOSIS — Z87891 Personal history of nicotine dependence: Secondary | ICD-10-CM | POA: Diagnosis not present

## 2015-06-13 DIAGNOSIS — R072 Precordial pain: Secondary | ICD-10-CM | POA: Diagnosis not present

## 2015-06-13 DIAGNOSIS — Z79899 Other long term (current) drug therapy: Secondary | ICD-10-CM | POA: Insufficient documentation

## 2015-06-13 DIAGNOSIS — Z7982 Long term (current) use of aspirin: Secondary | ICD-10-CM | POA: Diagnosis not present

## 2015-06-13 DIAGNOSIS — Z1231 Encounter for screening mammogram for malignant neoplasm of breast: Secondary | ICD-10-CM

## 2015-06-13 LAB — BASIC METABOLIC PANEL
ANION GAP: 5 (ref 5–15)
BUN: 11 mg/dL (ref 6–20)
CHLORIDE: 106 mmol/L (ref 101–111)
CO2: 26 mmol/L (ref 22–32)
Calcium: 9.5 mg/dL (ref 8.9–10.3)
Creatinine, Ser: 0.75 mg/dL (ref 0.44–1.00)
GFR calc non Af Amer: >60 mL/min (ref >60)
Glucose, Bld: 114 mg/dL — ABNORMAL HIGH (ref 65–99)
POTASSIUM: 3.6 mmol/L (ref 3.5–5.1)
Sodium: 137 mmol/L (ref 135–145)

## 2015-06-13 LAB — I-STAT TROPONIN, ED
TROPONIN I, POC: 0 ng/mL (ref 0.00–0.08)
Troponin i, poc: 0 ng/mL (ref 0.00–0.08)

## 2015-06-13 LAB — CBC
HCT: 34.6 % — ABNORMAL LOW (ref 36.0–46.0)
HEMOGLOBIN: 11.7 g/dL — AB (ref 12.0–15.0)
MCH: 32.1 pg (ref 26.0–34.0)
MCHC: 33.8 g/dL (ref 30.0–36.0)
MCV: 94.8 fL (ref 78.0–100.0)
Platelets: 204 10*3/uL (ref 150–400)
RBC: 3.65 MIL/uL — AB (ref 3.87–5.11)
RDW: 12.4 % (ref 11.5–15.5)
WBC: 3.7 10*3/uL — ABNORMAL LOW (ref 4.0–10.5)

## 2015-06-13 IMAGING — DX DG CHEST 2V
2 series · 2 of 2 positions shown · non-contrast
Comparison: [DATE].

CLINICAL DATA: Chest pain radiating to the neck and jaw for the
past 3 days.

EXAM:
CHEST  2 VIEW

[w chest pa]
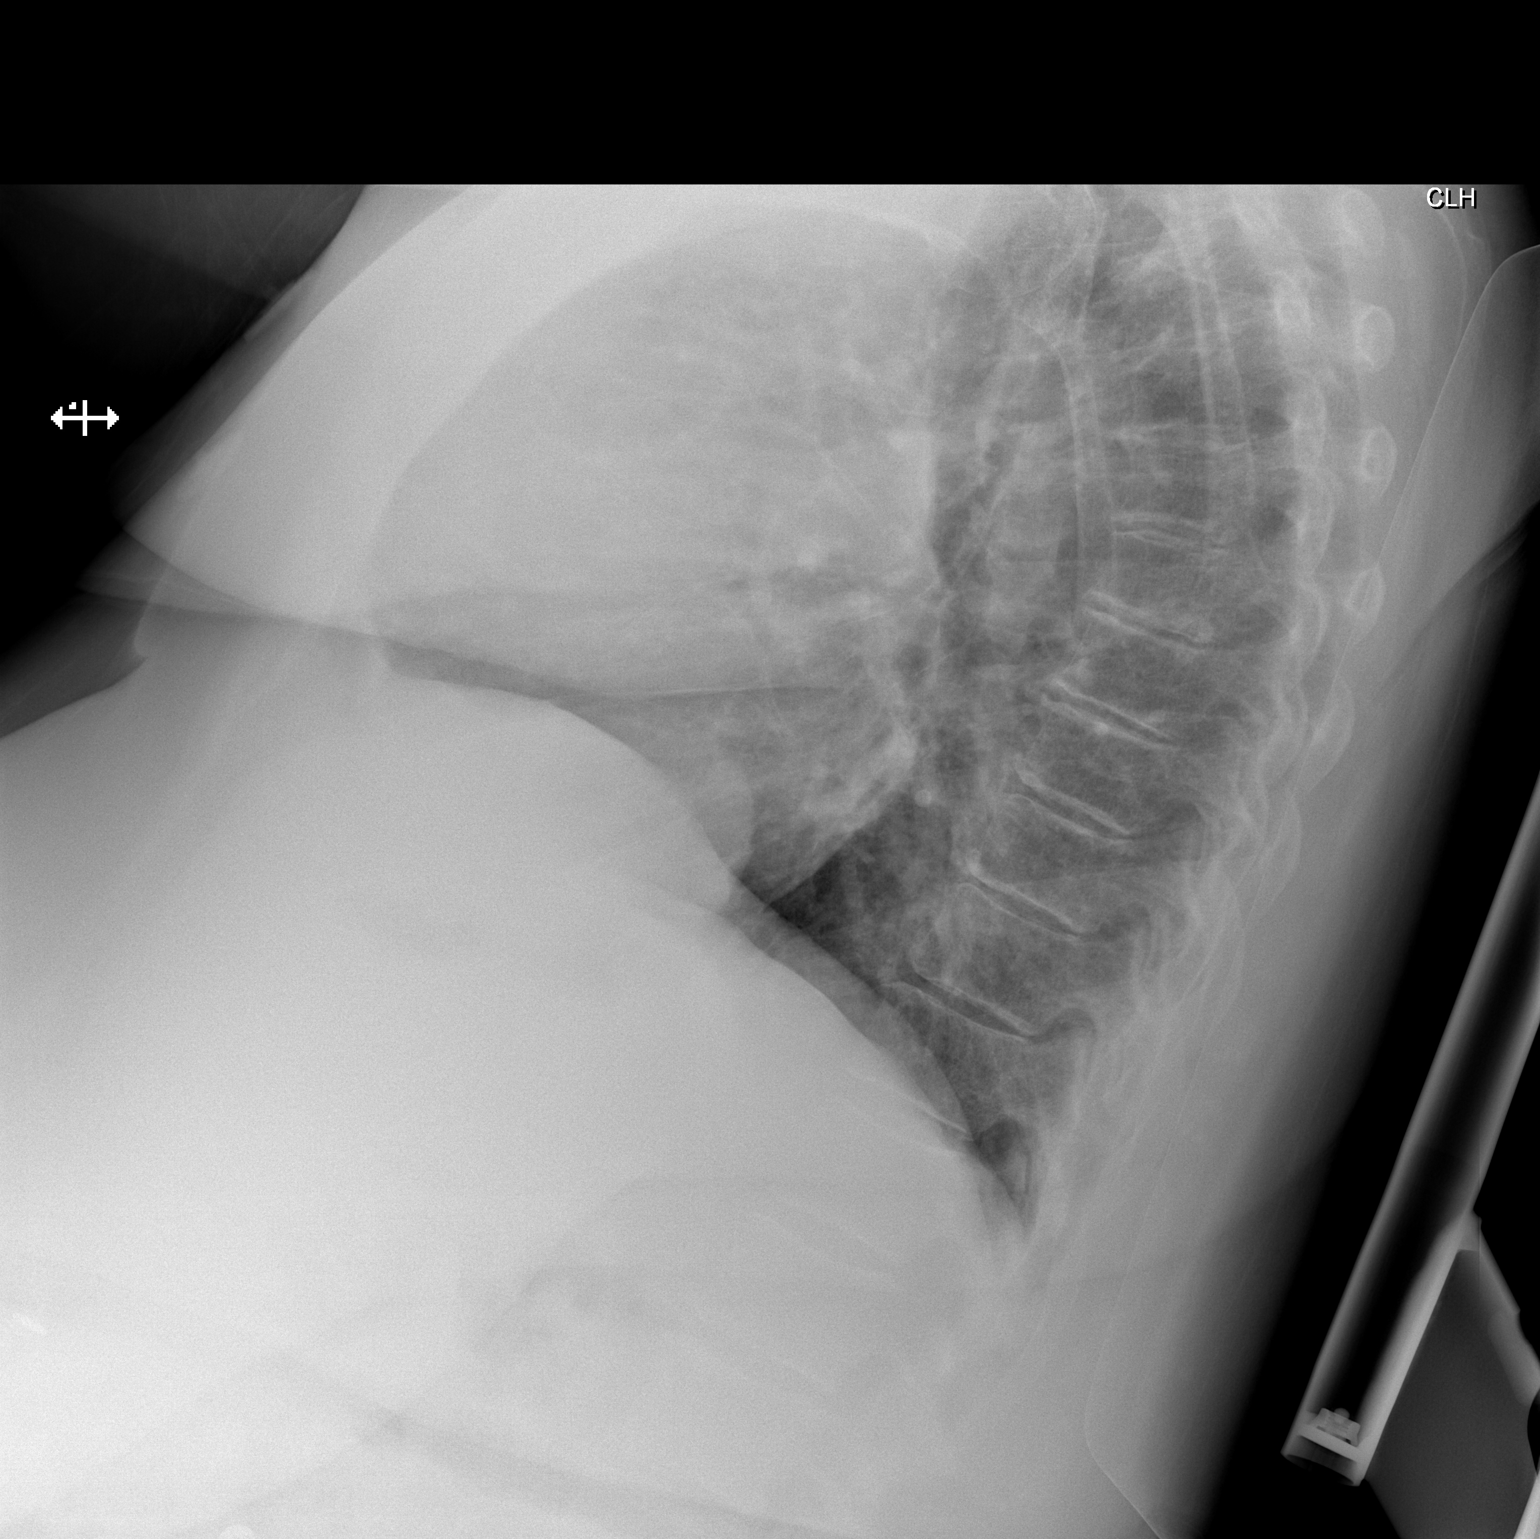

[x chest ap]
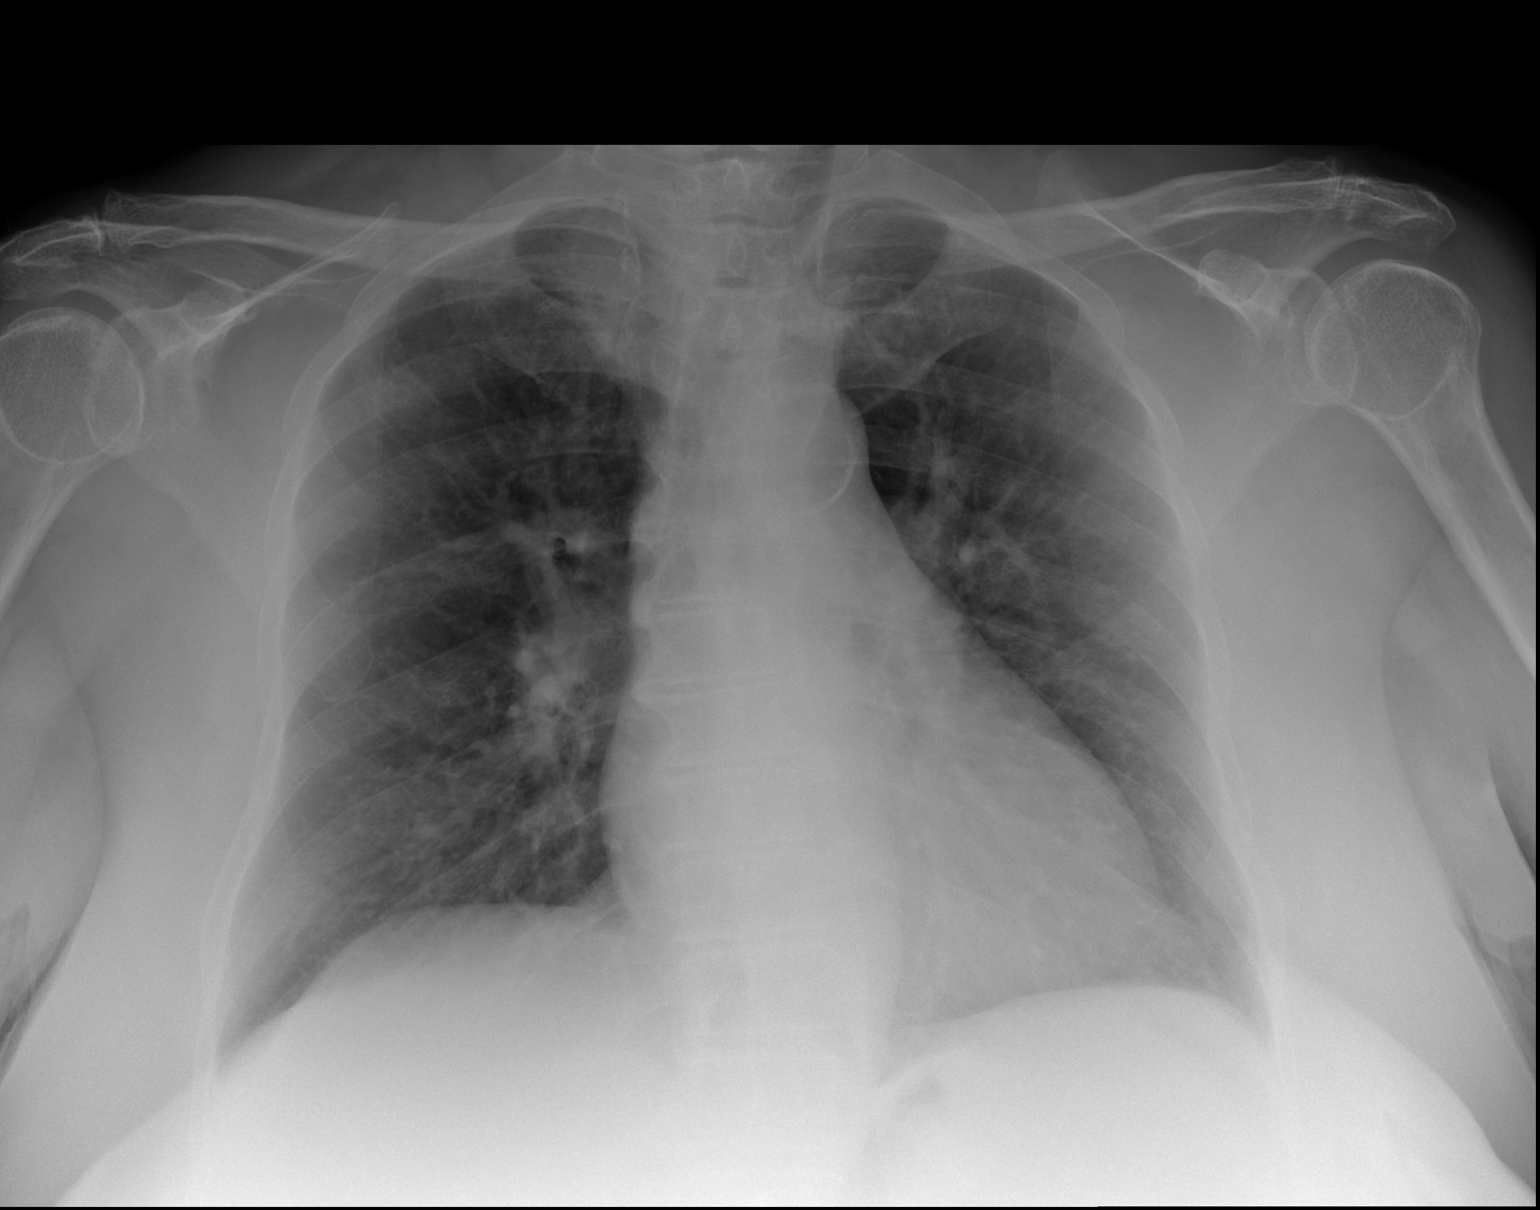

[2 of 2 positions shown; findings below may reference images not displayed]

FINDINGS: Stable mildly enlarged cardiac silhouette. Mildly prominent
pulmonary vasculature and interstitial markings without significant
change. Stable tracheal deviation to the left Thoracic spine
degenerative changes.
IMPRESSION: 1. No acute abnormality.
2. Stable mild cardiomegaly, mild pulmonary vascular congestion and
mild chronic interstitial lung disease.
3. Stable tracheal deviation to the left, most likely due to a
goiter.

## 2015-06-13 NOTE — Discharge Instructions (Signed)

## 2015-06-13 NOTE — ED Notes (Signed)
Pt is continuing to have chest pains that radiate to neck and jaw.

## 2015-06-13 NOTE — ED Provider Notes (Signed)
CSN: [id number]     Arrival date & time 06/13/15  1442 History   First MD Initiated Contact with Patient 06/13/15 1647     Chief Complaint  Patient presents with  . Chest Pain     (Consider location/radiation/quality/duration/timing/severity/associated sxs/prior Treatment) HPI Comments: Patient here with chest pain. History of chest pain intermittently for the past 8 months. She had a stress test as high risk showing some wall motion amounts but subsequent cath was normal and had no evidence of coronary artery disease. She has been seen by cardiology and follow up many times after that for the chest pain and it is felt to be noncardiac related. She's here similar episodes of the chest pain that are sporadic, coming 3-4 times day lasting about a minute. There are no instigating factors. There are no alleviating or exacerbating factors. She also says there is some mild cough. No fever, shortness of breath, nausea, vomiting.  Patient is a 64 y.o. female presenting with chest pain. The history is provided by the patient.  Chest Pain Pain location:  Substernal area Pain quality: sharp   Pain radiates to:  L jaw Pain radiates to the back: no   Pain severity:  Moderate Onset quality:  Gradual Duration:  1 minute Timing:  Sporadic Chronicity:  Recurrent Context: at rest   Relieved by:  Nothing Worsened by:  Nothing tried Associated symptoms: cough   Associated symptoms: no abdominal pain, no fever, no shortness of breath and not vomiting     Past Medical History  Diagnosis Date  . Hypertension   . Enlarged LA (left atrium)   . Hyperlipidemia   . Obesity   . Heavy cigarette smoker     Quit 2015  . Morbid obesity [facility])    Past Surgical History  Procedure Laterality Date  . Tubal ligation    . Left heart catheterization with coronary angiogram N/A 10/21/2014    Procedure: LEFT HEART CATHETERIZATION WITH CORONARY ANGIOGRAM;  Surgeon: Burnell Blanks, MD;  Location: [facility] CATH LAB;   Service: Cardiovascular;  Laterality: N/A;   Family History  Problem Relation Age of Onset  . Hypertension Mother   . Heart disease Mother     Pacemaker  . Kidney disease Mother   . Cataracts Mother   . Hearing loss Mother   . Diabetes Mother   . Diabetes Sister   . Cataracts Brother   . Hearing loss Brother    Social History  Substance Use Topics  . Smoking status: Former Smoker -- 1.00 packs/day for 45 years    Types: Cigarettes    Quit date: 08/06/2014  . Smokeless tobacco: None  . Alcohol Use: No   OB History    No data available     Review of Systems  Constitutional: Negative for fever.  Respiratory: Positive for cough. Negative for shortness of breath.   Cardiovascular: Positive for chest pain.  Gastrointestinal: Negative for vomiting and abdominal pain.  All other systems reviewed and are negative.     Allergies  Cortisone  Home Medications   Prior to Admission medications   Medication Sig Start Date End Date Taking? Authorizing Provider  aspirin 81 MG tablet Take 81 mg by mouth daily.    Historical Provider, MD  calcium citrate-vitamin D (CITRACAL+D) 315-200 MG-UNIT per tablet Take 1 tablet by mouth daily.    Historical Provider, MD  hydrochlorothiazide (HYDRODIURIL) 25 MG tablet Take 25 mg by mouth daily.    Historical Provider, MD  ketoconazole (NIZORAL)  2 % cream Apply 1 application topically daily. 06/06/15   Wallene Huh, DPM  omeprazole (PRILOSEC) 20 MG capsule Take 1 capsule (20 mg total) by mouth daily. 05/03/15   Girtha Rm, NP  Potassium 99 MG TABS Take 1 tablet by mouth daily.    Historical Provider, MD  potassium chloride SA (K-DUR,KLOR-CON) 20 MEQ tablet Take 1 tablet (20 mEq total) by mouth daily. 05/04/15   Vickie L Henson, NP   BP 136/58 mmHg  Pulse 75  Temp(Src) 99.3 F (37.4 C)  Resp 17  Ht 5\' 3"  (1.6 m)  Wt 272 lb (123.378 kg)  BMI 48.19 kg/m2  SpO2 98% Physical Exam  Constitutional: She is oriented to person, place, and  time. She appears well-developed and well-nourished. No distress.  HENT:  Head: Normocephalic and atraumatic.  Mouth/Throat: Oropharynx is clear and moist.  Eyes: EOM are normal. Pupils are equal, round, and reactive to light.  Neck: Normal range of motion. Neck supple.  Cardiovascular: Normal rate and regular rhythm.  Exam reveals no friction rub.   No murmur heard. Pulmonary/Chest: Effort normal and breath sounds normal. No respiratory distress. She has no wheezes. She has no rales.  Abdominal: Soft. She exhibits no distension. There is no tenderness. There is no rebound.  Musculoskeletal: Normal range of motion. She exhibits no edema.  Neurological: She is alert and oriented to person, place, and time.  Skin: No rash noted. She is not diaphoretic.  Nursing note and vitals reviewed.   ED Course  Procedures (including critical care time) Labs Review Labs Reviewed  BASIC METABOLIC PANEL - Abnormal; Notable for the following:    Glucose, Bld 114 (*)    All other components within normal limits  CBC - Abnormal; Notable for the following:    WBC 3.7 (*)    RBC 3.65 (*)    Hemoglobin 11.7 (*)    HCT 34.6 (*)    All other components within normal limits  I-STAT TROPOININ, ED    Imaging Review Dg Chest 2 View  06/13/2015   CLINICAL DATA:  Chest pain radiating to the neck and jaw for the past 3 days.  EXAM: CHEST  2 VIEW  COMPARISON:  01/01/2015.  FINDINGS: Stable mildly enlarged cardiac silhouette. Mildly prominent pulmonary vasculature and interstitial markings without significant change. Stable tracheal deviation to the left Thoracic spine degenerative changes.  IMPRESSION: 1. No acute abnormality. 2. Stable mild cardiomegaly, mild pulmonary vascular congestion and mild chronic interstitial lung disease. 3. Stable tracheal deviation to the left, most likely due to a goiter.   Electronically Signed   By: Claudie Revering M.D.   On: 06/13/2015 15:38   I have personally reviewed and  evaluated these images and lab results as part of my medical decision-making.   EKG Interpretation   Date/Time:  Monday June 13 2015 14:50:11 EDT Ventricular Rate:  74 PR Interval:  194 QRS Duration: 88 QT Interval:  390 QTC Calculation: 432 R Axis:   -32 Text Interpretation:  Normal sinus rhythm Left axis deviation Abnormal ECG  No significant change since last tracing Confirmed by Mingo Amber  MD, Garza  ([id number]) on 06/13/2015 5:42:00 PM      MDM   Final diagnoses:  Chest pain, unspecified chest pain type    64 year old female here with chest pain. She's had an extensive workup with negative cardiac cath within the past year. She states this feels similar to her chest pain that cardiology feels is non-anginal. Here EKG is  similar to prior. Troponins are normal. Chest x-ray is normal. Will have her follow-up with her primary doctor and cardiology. Chest pain is atypical here with multiple intermittent episodes that are sharp. I do not feel she needs admission for an ACS r/o. Stable for discharge.   Evelina Bucy, MD 06/14/15 [phone]

## 2015-06-13 NOTE — ED Notes (Signed)
Pt reports chest pain off and on for a year, states this episode of chest pain began Friday. Pt reports mid sternal chest tightness that radiates to her right neck. Pt alert, oriented, nad.

## 2015-06-13 NOTE — ED Notes (Signed)
MD at bedside. 

## 2015-06-21 ENCOUNTER — Telehealth: Payer: Self-pay | Admitting: Internal Medicine

## 2015-06-21 NOTE — Telephone Encounter (Signed)
Pt.notified

## 2015-06-21 NOTE — Telephone Encounter (Signed)
Please call her and let her know that her potassium on October 3rd when she was in the emergency department was 3.6 and this is normal.  Normal range is 3.5-5 and she is actually low normal. She should continue taking her potassium

## 2015-06-21 NOTE — Telephone Encounter (Signed)
Pt states that she went to ER last week and her potassium is up and she has been eating bananas and wants to know if she needs to stop taking her med or cutting it in half since its up.

## 2015-06-29 ENCOUNTER — Other Ambulatory Visit: Payer: Medicare Other

## 2015-06-29 ENCOUNTER — Telehealth: Payer: Self-pay | Admitting: Family Medicine

## 2015-06-29 DIAGNOSIS — E876 Hypokalemia: Secondary | ICD-10-CM | POA: Diagnosis not present

## 2015-06-29 LAB — BASIC METABOLIC PANEL
BUN: 10 mg/dL (ref 7–25)
CALCIUM: 9.3 mg/dL (ref 8.6–10.4)
CO2: 30 mmol/L (ref 20–31)
Chloride: 104 mmol/L (ref 98–110)
Creat: 0.74 mg/dL (ref 0.50–0.99)
Glucose, Bld: 102 mg/dL — ABNORMAL HIGH (ref 65–99)
POTASSIUM: 3.8 mmol/L (ref 3.5–5.3)
SODIUM: 138 mmol/L (ref 135–146)

## 2015-06-29 NOTE — Telephone Encounter (Signed)
Please call her and see what her symptoms are and if she would like to be seen for her symptoms. I cannot diagnose her over the telephone. Thanks.

## 2015-06-29 NOTE — Telephone Encounter (Signed)
Pt come in for a lab visit and stopped by on the way out and said she wanted let you know that she was still having a bad cough and that she woke up sat morning with a bad sore throat and that her throat hurst on and off, but the cough is nagging, said if you could give her a call later, said please advise her on what to do for it, pt can be reached at [phone] (M)

## 2015-06-29 NOTE — Telephone Encounter (Signed)
Pt woke up with her sore throat the other day and has a nagging cough. Pt was advised that she would need to be seen. Pt is scheduled for Monday at 10

## 2015-06-29 NOTE — Telephone Encounter (Signed)
Called pt but unable to leave a message as patient's vm is full.  1- if new symptoms she will need to be seen 2- same symptoms as previous appt and sore throat is just in the morning, she can try a humidifier and see if that helps but we are not sure if her symptoms have changed that she will probably need to be seen for additional eval

## 2015-07-01 ENCOUNTER — Ambulatory Visit (AMBULATORY_SURGERY_CENTER): Payer: Self-pay

## 2015-07-01 VITALS — Ht 63.0 in | Wt 284.0 lb

## 2015-07-01 DIAGNOSIS — Z1211 Encounter for screening for malignant neoplasm of colon: Secondary | ICD-10-CM

## 2015-07-01 MED ORDER — NA SULFATE-K SULFATE-MG SULF 17.5-3.13-1.6 GM/177ML PO SOLN: 1.0000 | Freq: Once | ORAL | Status: DC

## 2015-07-01 NOTE — Progress Notes (Signed)
Pt will need colonoscopy at [facility] due to BMI 50.31.Staff message sent to [facility] Mate for scheduling. No egg or soy allergies Not on home 02 No previous anesthesia complications No diet or weight loss meds Previous colon 11 yrs ago Ladell Pier, unknown MD.Pt stated exam was normal.

## 2015-07-04 ENCOUNTER — Ambulatory Visit (INDEPENDENT_AMBULATORY_CARE_PROVIDER_SITE_OTHER): Payer: Medicare Other | Admitting: Family Medicine

## 2015-07-04 ENCOUNTER — Encounter: Payer: Self-pay | Admitting: Family Medicine

## 2015-07-04 VITALS — BP 132/82 | HR 68 | Temp 97.1°F | Wt 282.4 lb

## 2015-07-04 DIAGNOSIS — K219 Gastro-esophageal reflux disease without esophagitis: Secondary | ICD-10-CM | POA: Diagnosis not present

## 2015-07-04 DIAGNOSIS — E876 Hypokalemia: Secondary | ICD-10-CM | POA: Diagnosis not present

## 2015-07-04 DIAGNOSIS — I10 Essential (primary) hypertension: Secondary | ICD-10-CM | POA: Diagnosis not present

## 2015-07-04 NOTE — Patient Instructions (Signed)
Try taking the Dexilant daily for the next 5 days and see if you can tell a difference with this medication. It is important that you watch her calories and try to lose a small amount of weight. I will refer you to a nutritionist if you are willing to go.    Food Choices for Gastroesophageal Reflux Disease, Adult When you have gastroesophageal reflux disease (GERD), the foods you eat and your eating habits are very important. Choosing the right foods can help ease the discomfort of GERD. WHAT GENERAL GUIDELINES DO I NEED TO FOLLOW?  Choose fruits, vegetables, whole grains, low-fat dairy products, and low-fat meat, fish, and poultry.  Limit fats such as oils, salad dressings, butter, nuts, and avocado.  Keep a food diary to identify foods that cause symptoms.  Avoid foods that cause reflux. These may be different for different people.  Eat frequent small meals instead of three large meals each day.  Eat your meals slowly, in a relaxed setting.  Limit fried foods.  Cook foods using methods other than frying.  Avoid drinking alcohol.  Avoid drinking large amounts of liquids with your meals.  Avoid bending over or lying down until 2-3 hours after eating. WHAT FOODS ARE NOT RECOMMENDED? The following are some foods and drinks that may worsen your symptoms: Vegetables Tomatoes. Tomato juice. Tomato and spaghetti sauce. Chili peppers. Onion and garlic. Horseradish. Fruits Oranges, grapefruit, and lemon (fruit and juice). Meats High-fat meats, fish, and poultry. This includes hot dogs, ribs, ham, sausage, salami, and bacon. Dairy Whole milk and chocolate milk. Sour cream. Cream. Butter. Ice cream. Cream cheese.  Beverages Coffee and tea, with or without caffeine. Carbonated beverages or energy drinks. Condiments Hot sauce. Barbecue sauce.  Sweets/Desserts Chocolate and cocoa. Donuts. Peppermint and spearmint. Fats and Oils High-fat foods, including [place] fries and potato  chips. Other Vinegar. Strong spices, such as black pepper, white pepper, red pepper, cayenne, curry powder, cloves, ginger, and chili powder. The items listed above may not be a complete list of foods and beverages to avoid. Contact your dietitian for more information.   This information is not intended to replace advice given to you by your health care provider. Make sure you discuss any questions you have with your health care provider.   Document Released: 08/27/2005 Document Revised: 09/17/2014 Document Reviewed: 07/01/2013 Elsevier Interactive Patient Education [employer].

## 2015-07-04 NOTE — Progress Notes (Signed)
Subjective:    Patient ID: Sheila Jacobs, female    DOB: 1950-11-14, 64 y.o.   MRN: [id number]  HPI She is here with multiple complaints. She states she has an intermittent sore throat and thinks this is related to her taking her pills. She also complains of non radiating chest pain to center of her chest that comes on 5-10 minutes after she eats and goes away within 10-15 minutes without intervention. She denies chest pain at present. States she feels fine. Denies chest pain with exertion. Denies DOE and orthopnea. This has been going on for several months and patient states she thinks this is related to her heart. States the chest pain is the same as when she was at the emergency department for chest pain on 06/13/2015 and the same type of pain that she was worked up for by cardiology, no changes per patient.  Denies Fever, chills, nausea, vomiting.  She also complains of feeling like her "nerves are bad". States she lost her mother last year and still thinks about her daily. Does not want to go to counseling. States she has her daughter to talk to and is managing ok with this loss.  Patient has gained 10 lbs in past month and states she has been going out to eat more and states she is eating unhealthy foods that her daughter cooks. Reports diet high in fried foods.    Reviewed allergies, medications, past medical and social history.    Review of Systems Pertinent positives and negatives in the history of present illness.    Objective:   Physical Exam  Constitutional: She appears well-developed and well-nourished. No distress.  HENT:  Mouth/Throat: Oropharynx is clear and moist. No oropharyngeal exudate.  Neck: Normal range of motion. Neck supple.  Cardiovascular: Normal rate, regular rhythm and normal heart sounds.  Exam reveals no gallop and no friction rub.   No murmur heard. Difficult to assess pedal pulses due to severe obesity  Pulmonary/Chest: Effort normal and breath sounds  normal. She has no wheezes. She has no rales. She exhibits no tenderness.  Lymphadenopathy:    She has no cervical adenopathy.        Assessment & Plan:  Gastroesophageal reflux disease, esophagitis presence not specified  Essential hypertension  Hypokalemia  Morbid obesity, unspecified obesity type ([place])  Discussed that her chest pain appears to be related to her GERD and not her heart. Cardiac etiology ruled out on 06/13/15 for same pain she is having intermittently. Dicussed patient with Dr. Redmond School. She will try taking Dexilant for next 5 days and see if this helps with her symptoms. She will let me know if she notices a difference with this medication. She has an appointment with Dr. Ardis Hughs, GI, for screening colonoscopy. Will have [place], [place] contact them for possible future workup of GERD. Education given at last appointment and again today for ways to prevent or minimize reflux symptoms.   Her blood pressure is controlled on HCTZ 12.5mg . No changes today to her BP medication. She will continue to check BP at least weekly and will let me know if elevated beyond goal.  Potassium within normal range and she will continue on prescribed potassium supplement.   Discussed that she has gained 10 pounds in the past month and recommend that she see a nutritionist. She refuses to do this and states she would like to try and lose weight on her own.  Discussed making better food choices when she goes out to  eat such as baked or broiled or grilled. Also recommend that she try being more active by walking 5-10 minutes at a time throughout the day.

## 2015-07-05 ENCOUNTER — Telehealth: Payer: Self-pay

## 2015-07-05 NOTE — Telephone Encounter (Signed)
-----   Message from Kasandra Knudsen, RN sent at 07/01/2015 11:17 AM EDT ----- Regarding: Scheduling Colonoscopy at [facility]  This patient needs to be scheduled at [facility] due to a BMI greater than 50. Do you put the orders in and schedule the procedure? I'm kind of new to [facility] and wanted to make sure. Let me know if I need to do anything else.   Thanks!   Abigail Butts

## 2015-07-05 NOTE — Telephone Encounter (Signed)
i reviewed her recent PCP note.  She has issues with GERD and PCP was hoping for our input.  Would cancel plans for screening colonoscopy and instead recommend she come in for ngi appt (next available, do not double book) for GERD, colon cancer screening.  ANy procedures would have to be done at hospital given morbid obesity (BMI 51).

## 2015-07-05 NOTE — Telephone Encounter (Signed)
Dr Ardis Hughs please review for BMI >51

## 2015-07-06 NOTE — Telephone Encounter (Signed)
Left message for patient to call back  

## 2015-07-08 NOTE — Telephone Encounter (Signed)
Called patient to notify her of appointment details but no answer and I was not able to leave a msg. Pt has a scheduled appoint with Dr. Ardis Hughs on 09-02-15 at 11:15 as a new patient appointment

## 2015-07-11 ENCOUNTER — Telehealth: Payer: Self-pay | Admitting: Family Medicine

## 2015-07-11 NOTE — Telephone Encounter (Signed)
Pt called stating that she needs to go see a neurologist said she has had a bum bing noise in her ear all weekend, said she dosen't know what else do about it, pt can be reached at [phone]

## 2015-07-11 NOTE — Telephone Encounter (Signed)
Pt is coming in Wednesday to be seen.

## 2015-07-13 ENCOUNTER — Encounter: Payer: Self-pay | Admitting: Family Medicine

## 2015-07-13 ENCOUNTER — Ambulatory Visit (INDEPENDENT_AMBULATORY_CARE_PROVIDER_SITE_OTHER): Payer: Medicare Other | Admitting: Family Medicine

## 2015-07-13 VITALS — BP 124/72 | HR 68 | Wt 284.2 lb

## 2015-07-13 DIAGNOSIS — F411 Generalized anxiety disorder: Secondary | ICD-10-CM

## 2015-07-13 DIAGNOSIS — H9193 Unspecified hearing loss, bilateral: Secondary | ICD-10-CM | POA: Diagnosis not present

## 2015-07-13 DIAGNOSIS — K219 Gastro-esophageal reflux disease without esophagitis: Secondary | ICD-10-CM | POA: Diagnosis not present

## 2015-07-13 LAB — TSH: TSH: 0.526 u[IU]/mL (ref 0.350–4.500)

## 2015-07-13 NOTE — Progress Notes (Signed)
Subjective:    Patient ID: Sheila Jacobs, female    DOB: [birth year], 64 y.o.   MRN: [id number]  HPI Chief Complaint  Patient presents with  . thumping in ear    thumping in right  ear. constantly. thinks its her nerves. would like a refill on her cream for her toe. she has gained 2 pounds since her last visit. says she needs to cut back on junk food   She is here for problems with her "nerves" and states she needs some medicine for this. She states she used to take anti-anxiety medication in the past but stopped taking this and wanted to try and work on controlling it on her own. States she lives alone and has so much time on her hands that she has been eating a lot, board and watching a lot of television. She states she sits around and thinks of things to worry about. States she has to sleep with the lights on in her house at night and that she does not like to stay by herself. States she often spends the night at her daughter's house where she feels safer. She is aware that she has gained about 10 pounds in the past month. She states she likes to eat  Also reports hearing a "bump in her right ear like someone is slamming a door" for 6 months or so and occurs when she is walking. States this is not as bad as it was and she thinks it is related to her "nerves". States it does not happen during the day when she is with other people or just sitting and watching TV but this happens when she is up and moving.  She states she is not experiencing this sensation right now. States she went to the hearing clinic earlier this year and had the same thumping in her ear but did not report this to the provider.   States she is taking her reflux medication and this seems to be improving. She denies chest pain or palpitations today. Denies headache, dizziness, visual changes shortness of breath.  Reviewed allergies, medications, past medical, and social history.   Review of Systems Pertinent positives and  negatives in the history of present illness  Objective:   Physical Exam  Constitutional: She is oriented to person, place, and time. She appears well-developed and well-nourished. No distress.  HENT:  Right Ear: Tympanic membrane, external ear and ear canal normal. Decreased hearing is noted.  Left Ear: Decreased hearing is noted.  Nose: Nose normal.  Mouth/Throat: Uvula is midline, oropharynx is clear and moist and mucous membranes are normal. No oropharyngeal exudate or posterior oropharyngeal edema.  Hearing aid in left ear.  Whisper test positive for significant hearing loss at 6 ft, 65ft, and 1 foot.  Eyes: Conjunctivae, EOM and lids are normal. Pupils are equal, round, and reactive to light.  Neck: Normal range of motion and full passive range of motion without pain. Neck supple. Normal carotid pulses present. No muscular tenderness present. Carotid bruit is not present.  Cardiovascular: Normal rate, regular rhythm and normal heart sounds.   Pulmonary/Chest: Effort normal and breath sounds normal. She has no wheezes. She has no rhonchi.  Lymphadenopathy:    She has no cervical adenopathy.       Right: No supraclavicular adenopathy present.       Left: No supraclavicular adenopathy present.  Neurological: She is alert and oriented to person, place, and time. She has normal strength. No cranial nerve  deficit or sensory deficit. She displays a negative Romberg sign. Coordination and gait normal.  Negative pronator drift, equal grip and leg strength  Psychiatric: She has a normal mood and affect. Her speech is normal and behavior is normal. Judgment and thought content normal.   BP 124/72 mmHg  Pulse 68  Wt 284 lb 3.2 oz (128.912 kg)   Hearing test performed: appears to have total hearing loss in right ear, hearing aid to left hear    Assessment & Plan:  Generalized anxiety disorder - Plan: TSH, Ambulatory referral to Psychology  Gastroesophageal reflux disease, esophagitis  presence not specified  Morbid obesity, unspecified obesity type ([place]) - Plan: Amb ref to Medical Nutrition Therapy-MNT  Hearing loss, bilateral  Recommend that she see a counselor for anxiety and worrying and she agreed to do this. Discussed that anti-anxiety medication has side effects and that I will work with her with the counselor's recommendations. No medication prescribed today. Discussed that she would benefit from getting back in the "[employer] program" that she used to work in. She states she did this for about a year but that they did not pay well. Discussed that she has too much idle time now to worry and that this program would give her a purpose and also make her feel good by helping other people. She agreed and will check into this.  Patient has total hearing loss in right ear, will need input from hearing clinic where she was evaluated earlier this year. She states she cannot afford a second hearing aid. Reports "thumping sensation" was present when she was last at the hearing clinic but she did not report this to them. Reviewed with patient that her carotid doppler study from earlier this year showed mild plaque and no reason to follow up on this.  GERD- recommend she continue taking her medication daily and try to avoid offending foods and eating before she goes to bed. Obesity- recommend she see a nutritionist to help her with her overeating and food choices. Spent a minimum of 30 minutes face-to-face with patient, 50% of that was in counseling and coordination of care.

## 2015-07-13 NOTE — Patient Instructions (Signed)
The nutritionist will call you to schedule an appointment. You need to call the counselor and schedule an appointment.   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    Generalized Anxiety Disorder Generalized anxiety disorder (GAD) is a mental disorder. It interferes with life functions, including relationships, work, and school. GAD is different from normal anxiety, which everyone experiences at some point in their lives in response to specific life events and activities. Normal anxiety actually helps [place] prepare for and get through these life events and activities. Normal anxiety goes away after the event or activity is over.  GAD causes anxiety that is not necessarily related to specific events or activities. It also causes excess anxiety in proportion to specific events or activities. The anxiety associated with GAD is also difficult to control. GAD can vary from mild to severe. People with severe GAD can have intense waves of anxiety with physical symptoms (panic attacks).  SYMPTOMS The anxiety and worry associated with GAD are difficult to control. This anxiety and worry are related to many life events and activities and also occur more days than not for 6 months or longer. People with GAD also have three or more of the following symptoms (one or more in children):  Restlessness.   Fatigue.  Difficulty concentrating.   Irritability.  Muscle tension.  Difficulty sleeping or unsatisfying sleep. DIAGNOSIS GAD is diagnosed through an assessment by your health care provider. Your health care provider will ask you questions aboutyour mood,physical symptoms, and events in your life. Your health care provider may ask you about your medical history and use of alcohol or drugs, including prescription medicines. Your health care provider may also do a physical exam and  blood tests. Certain medical conditions and the use of certain substances can cause symptoms similar to those associated with GAD. Your health care provider may refer you to a mental health specialist for further evaluation. TREATMENT The following therapies are usually used to treat GAD:   Medication. Antidepressant medication usually is prescribed for long-term daily control. Antianxiety medicines may be added in severe cases, especially when panic attacks occur.   Talk therapy (psychotherapy). Certain types of talk therapy can be helpful in treating GAD by providing support, education, and guidance. A form of talk therapy called cognitive behavioral therapy can teach you healthy ways to think about and react to daily life events and activities.  Stress managementtechniques. These include yoga, meditation, and exercise and can be very helpful when they are practiced regularly. A mental health specialist can help determine which treatment is best for you. Some people see improvement with one therapy. However, other people require a combination of therapies.   This information is not intended to replace advice given to you by your health care provider. Make sure you discuss any questions you have with your health care provider.   Document Released: 12/22/2012 Document Revised: 09/17/2014 Document Reviewed: 12/22/2012 Elsevier Interactive Patient Education [employer].

## 2015-07-15 ENCOUNTER — Encounter: Payer: Medicare HMO | Admitting: Gastroenterology

## 2015-07-15 ENCOUNTER — Telehealth: Payer: Self-pay | Admitting: Gastroenterology

## 2015-07-15 NOTE — Telephone Encounter (Signed)
Spoke to patient she thought that she had appointment for a colonoscopy today. Patient was made aware that she has a new patient appointment scheduled for 09-02-15 at 11:15am. Patient did have a scheduled colonoscopy for today at [facility] but was cancelled due to patient BMI on 07-01-15. We tried to call patient multiply time and left msg to contact our office. Patient verbalized understanding of office appointment.

## 2015-07-19 ENCOUNTER — Other Ambulatory Visit: Payer: Self-pay

## 2015-07-19 ENCOUNTER — Ambulatory Visit
Admission: RE | Admit: 2015-07-19 | Discharge: 2015-07-19 | Disposition: A | Discharge status: Home / Self Care | Payer: Medicare Other | Source: Ambulatory Visit

## 2015-07-19 DIAGNOSIS — Z1231 Encounter for screening mammogram for malignant neoplasm of breast: Secondary | ICD-10-CM | POA: Diagnosis not present

## 2015-07-21 ENCOUNTER — Other Ambulatory Visit: Payer: Self-pay | Admitting: Family Medicine

## 2015-07-21 DIAGNOSIS — R928 Other abnormal and inconclusive findings on diagnostic imaging of breast: Secondary | ICD-10-CM

## 2015-07-26 ENCOUNTER — Ambulatory Visit
Admission: RE | Admit: 2015-07-26 | Discharge: 2015-07-26 | Disposition: A | Discharge status: Home / Self Care | Payer: Medicare Other | Source: Ambulatory Visit | Attending: Family Medicine | Admitting: Family Medicine

## 2015-07-26 DIAGNOSIS — R928 Other abnormal and inconclusive findings on diagnostic imaging of breast: Secondary | ICD-10-CM

## 2015-07-26 DIAGNOSIS — N6489 Other specified disorders of breast: Secondary | ICD-10-CM | POA: Diagnosis not present

## 2015-07-28 ENCOUNTER — Other Ambulatory Visit: Payer: Self-pay | Admitting: Podiatry

## 2015-08-01 ENCOUNTER — Ambulatory Visit (INDEPENDENT_AMBULATORY_CARE_PROVIDER_SITE_OTHER): Payer: 59 | Admitting: Licensed Clinical Social Worker

## 2015-08-01 DIAGNOSIS — F4322 Adjustment disorder with anxiety: Secondary | ICD-10-CM | POA: Diagnosis not present

## 2015-08-10 ENCOUNTER — Ambulatory Visit (INDEPENDENT_AMBULATORY_CARE_PROVIDER_SITE_OTHER): Payer: Medicare Other | Admitting: Family Medicine

## 2015-08-10 ENCOUNTER — Encounter: Payer: Self-pay | Admitting: Family Medicine

## 2015-08-10 VITALS — BP 138/78 | HR 68 | Wt 284.4 lb

## 2015-08-10 DIAGNOSIS — I10 Essential (primary) hypertension: Secondary | ICD-10-CM

## 2015-08-10 DIAGNOSIS — K219 Gastro-esophageal reflux disease without esophagitis: Secondary | ICD-10-CM

## 2015-08-10 DIAGNOSIS — F411 Generalized anxiety disorder: Secondary | ICD-10-CM | POA: Diagnosis not present

## 2015-08-10 DIAGNOSIS — Z23 Encounter for immunization: Secondary | ICD-10-CM | POA: Diagnosis not present

## 2015-08-10 DIAGNOSIS — Z79899 Other long term (current) drug therapy: Secondary | ICD-10-CM

## 2015-08-10 MED ORDER — ALPRAZOLAM 0.25 MG PO TABS: 0.2500 mg | ORAL_TABLET | Freq: Two times a day (BID) | ORAL | Status: DC | PRN

## 2015-08-10 NOTE — Patient Instructions (Signed)
Call your insurance company and find out when you had your last physical exam and call us and let us know so that we can get you scheduled for fasting blood work and a physical.   You had the Prevnar 13 (pneumonia) shot today and the flu shot. No changes to your medications today.  Start going to the [facility] or somewhere with an indoor track so that you can start walking for exercise. This is important for your overall health.  DASH Eating Plan DASH stands for "Dietary Approaches to Stop Hypertension." The DASH eating plan is a healthy eating plan that has been shown to reduce high blood pressure (hypertension). Additional health benefits may include reducing the risk of type 2 diabetes mellitus, heart disease, and stroke. The DASH eating plan may also help with weight loss. WHAT DO I NEED TO KNOW ABOUT THE DASH EATING PLAN? For the DASH eating plan, you will follow these general guidelines:  Choose foods with a percent daily value for sodium of less than 5% (as listed on the food label  Use salt-free seasonings or herbs instead of table salt or sea salt.  Check with your health care provider or pharmacist before using salt substitutes.  Eat lower-sodium products, often labeled as "lower sodium" or "no salt added."  Eat fresh foods.  Eat more vegetables, fruits, and low-fat dairy products.  Choose whole grains. Look for the word "whole" as the first word in the ingredient list.  Choose fish and skinless chicken or [place] more often than red meat. Limit fish, poultry, and meat to 6 oz (170 g) each day.  Limit sweets, desserts, sugars, and sugary drinks.  Choose heart-healthy fats.  Limit cheese to 1 oz (28 g) per day.  Eat more home-cooked food and less restaurant, buffet, and fast food.  Limit fried foods.  Cook foods using methods other than frying.  Limit canned vegetables. If you do use them, rinse them well to decrease the sodium.  When eating at a restaurant, ask that your  food be prepared with less salt, or no salt if possible. WHAT FOODS CAN I EAT? Seek help from a dietitian for individual calorie needs. Grains Whole grain or whole wheat bread. Brown rice. Whole grain or whole wheat pasta. Quinoa, bulgur, and whole grain cereals. Low-sodium cereals. Corn or whole wheat flour tortillas. Whole grain cornbread. Whole grain crackers. Low-sodium crackers. Vegetables Fresh or frozen vegetables (raw, steamed, roasted, or grilled). Low-sodium or reduced-sodium tomato and vegetable juices. Low-sodium or reduced-sodium tomato sauce and paste. Low-sodium or reduced-sodium canned vegetables.  Fruits All fresh, canned (in natural juice), or frozen fruits. Meat and Other Protein Products Ground beef (85% or leaner), grass-fed beef, or beef trimmed of fat. Skinless chicken or [place]. Ground chicken or [place]. Pork trimmed of fat. All fish and seafood. Eggs. Dried beans, peas, or lentils. Unsalted nuts and seeds. Unsalted canned beans. Dairy Low-fat dairy products, such as skim or 1% milk, 2% or reduced-fat cheeses, low-fat ricotta or cottage cheese, or plain low-fat yogurt. Low-sodium or reduced-sodium cheeses. Fats and Oils Tub margarines without trans fats. Light or reduced-fat mayonnaise and salad dressings (reduced sodium). Avocado. Safflower, olive, or canola oils. Natural peanut or almond butter. Other Unsalted popcorn and pretzels. The items listed above may not be a complete list of recommended foods or beverages. Contact your dietitian for more options. WHAT FOODS ARE NOT RECOMMENDED? Grains White bread. White pasta. White rice. Refined cornbread. Bagels and croissants. Crackers that contain trans fat. Vegetables  Creamed or fried vegetables. Vegetables in a cheese sauce. Regular canned vegetables. Regular canned tomato sauce and paste. Regular tomato and vegetable juices. Fruits Dried fruits. Canned fruit in light or heavy syrup. Fruit juice. Meat and Other  Protein Products Fatty cuts of meat. Ribs, chicken wings, bacon, sausage, bologna, salami, chitterlings, fatback, hot dogs, bratwurst, and packaged luncheon meats. Salted nuts and seeds. Canned beans with salt. Dairy Whole or 2% milk, cream, half-and-half, and cream cheese. Whole-fat or sweetened yogurt. Full-fat cheeses or blue cheese. Nondairy creamers and whipped toppings. Processed cheese, cheese spreads, or cheese curds. Condiments Onion and garlic salt, seasoned salt, table salt, and sea salt. Canned and packaged gravies. Worcestershire sauce. Tartar sauce. Barbecue sauce. Teriyaki sauce. Soy sauce, including reduced sodium. Steak sauce. Fish sauce. Oyster sauce. Cocktail sauce. Horseradish. Ketchup and mustard. Meat flavorings and tenderizers. Bouillon cubes. Hot sauce. Tabasco sauce. Marinades. Taco seasonings. Relishes. Fats and Oils Butter, stick margarine, lard, shortening, ghee, and bacon fat. Coconut, palm kernel, or palm oils. Regular salad dressings. Other Pickles and olives. Salted popcorn and pretzels. The items listed above may not be a complete list of foods and beverages to avoid. Contact your dietitian for more information. WHERE CAN I FIND MORE INFORMATION? National Heart, Lung, and Blood Institute: travelstabloid.com   This information is not intended to replace advice given to you by your health care provider. Make sure you discuss any questions you have with your health care provider.   Document Released: 08/16/2011 Document Revised: 09/17/2014 Document Reviewed: 07/01/2013 Elsevier Interactive Patient Education [employer].

## 2015-08-10 NOTE — Progress Notes (Signed)
Subjective:    Patient ID: Sheila Jacobs, female    DOB: June 09, 1951, 64 y.o.   MRN: [id number]  HPI She is here for a follow up medication check. She reports good medication compliance and no problems with taking her daily medications.  She reports podiatrist prescribed nizoral for great toe.  Reports reflux seems to be some improved with medication. She is breaking her K-Dur pills in half and doing ok with this.  Has appointment with GI for colonoscopy in December. Appointment with Nutritionist also in December. She admits that she continues to eat whatever she wants, states she had a sausage biscuit for breakfast this morning. She has not started exercising and has not lost any weight since our last visit. She states she does not feel safe walking outside in her neighborhood.  States she continues to have a lot of problems with her "nerves" and is "real anxious" and worries about what might happen. She did go for a visit with counselor and states she does not want to continue going and did not feel like she benefited from it. She states she is not open to trying alternative stress reduction methods for her anxiety. She continues to request "nerve pills". States she has taken medication for her anxiety in the distant past and did fine then.    Current Outpatient Prescriptions on File Prior to Visit  Medication Sig Dispense Refill  . aspirin 81 MG tablet Take 81 mg by mouth daily.    . calcium citrate-vitamin D (CITRACAL+D) 315-200 MG-UNIT per tablet Take 1 tablet by mouth daily.    . hydrochlorothiazide (HYDRODIURIL) 25 MG tablet Take 12.5 mg by mouth daily.     Marland Kitchen ketoconazole (NIZORAL) 2 % cream APPLY ONE APPLICATION TOPICALLY DAILY 15 g 11  . omeprazole (PRILOSEC) 20 MG capsule Take 1 capsule (20 mg total) by mouth daily. 30 capsule 3  . potassium chloride SA (K-DUR,KLOR-CON) 20 MEQ tablet Take 1 tablet (20 mEq total) by mouth daily. 30 tablet 3   No current facility-administered  medications on file prior to visit.    Flu shot - will get today prevnar shot- has not had- agreed to get this today Shingles vaccine- has not had.   Review of Systems Pertinent positives and negatives in the history of present illness.    Objective:   Physical Exam  Alert, oriented and in no acute distress. Not otherwise examined     Assessment & Plan:  Essential hypertension  Gastroesophageal reflux disease, esophagitis presence not specified  Need for Streptococcus pneumoniae vaccination - Plan: Pneumococcal conjugate vaccine 13-valent IM  Needs flu shot - Plan: Flu vaccine HIGH DOSE PF  Morbid obesity, unspecified obesity type ([place])  Medication management  Generalized anxiety disorder  Discussed patient with Dr. Redmond School.  Anxiety- Counselor sent a note via patient regarding her visit with patient and her recommendations. The note states that she feels as though the patient would benefit from a short trial of a low-dose anti-anxiety medication and that the patient was pretty adamant at their visit that "pills" are the only thing that would help her. Xanax prescribed. Discussed in depth the risks and side effects of benzodiazepines in her age group and she is aware that she should use caution when taking these. Discussed that just having the medication on hand may ease her anxiety and that she may not actually need to take the medication very often. Discussed that I would not keep her on this medication long term and  that she would need to continue working on alternative stress reduction techniques such as talking with friends, taking a walk, finding a hobby that would keep her mind occupied as opposed to relying on medication to numb or fix her thoughts and feelings. Patient agreed to this plan of care.  Hypertension- no changes to medication, appears controlled GERD- doing well on current medication Obesity-encouraged her to go to her appointment with the nutritionist.  Recommend that she contact the [facility] to find out if she can afford to go there are somewhere else where she could walk on an indoor track. Discussed that losing just a few pounds would benefit her overall mental and physical health.  She will contact her insurance company and find out when her last physical exam was and let us know so that we may get her back for a medcheck plus appointment with fasting labs.  Zostavax prescription given to patient to take to her pharmacy.  Flu shot given Prevnar 13 given Spent at least 30 minutes face to face with patient and 50% of that in counseling and coordination of care.

## 2015-08-18 ENCOUNTER — Telehealth: Payer: Self-pay

## 2015-08-18 NOTE — Telephone Encounter (Signed)
Sheila Jacobs,  If patient had cpe back in June then she is not eligible for another one until next year that time. Also since her insurance will only cover 25% she will have to pay $273.75 out of pocket for the shingles shot. i will have to call her to cancel cpe for next week but did you need to see the patient for something else other than a cpe as she was here for a med check couple weeks ago.

## 2015-08-18 NOTE — Telephone Encounter (Signed)
Pt called you back saying her last physical was March 03, 2015 and her insurance will cover 25% of shingles shot.

## 2015-08-19 NOTE — Telephone Encounter (Signed)
I did prescribe a new medication at our last appointment and would like to see her to talk if it is helping or not. Have her back in about 2-3 weeks to follow up on new medication please. If she cannot afford the shingles shot then just document it. Thanks.

## 2015-08-19 NOTE — Telephone Encounter (Signed)
i have cancelled pt appt for next week and asked that she come back in a few weeks so she will call after christmas and schedule an appt for a follow-up on med

## 2015-08-22 ENCOUNTER — Encounter: Payer: Medicare Other | Attending: Family Medicine | Admitting: Dietician

## 2015-08-22 ENCOUNTER — Encounter: Payer: Self-pay | Admitting: Dietician

## 2015-08-22 DIAGNOSIS — Z713 Dietary counseling and surveillance: Secondary | ICD-10-CM | POA: Insufficient documentation

## 2015-08-22 NOTE — Progress Notes (Signed)
  Medical Nutrition Therapy:  Appt start time: 1030 end time:  1115.   Assessment:  Primary concerns today: Lylith states that she is here to "learn how to eat." She reports that she tries to stay away from salty foods. She eats out frequently if she has the money. She lives alone but her daughter is "in and out" and sometimes she stays at her daughter's house to stay with her grandchildren. She reports headaches if she eats a lot of salty foods.  Preferred Learning Style:   No preference indicated   Learning Readiness:   Ready   MEDICATIONS: see list   DIETARY INTAKE:  24-hr recall:  B ( AM): egg and toast with butter, sometimes grits or plantains  Snk ( AM):   L ( PM): noodles and tomato sauce Snk ( PM): potato chips and candy D ( PM): soup  Snk ( PM):   Beverages: soda, tea, coffee, water, ginger ale  Usual physical activity: none  Estimated energy needs: 1400-1600 calories  Progress Towards Goal(s):  In progress.   Nutritional Diagnosis:  [place]-2.2 Altered nutrition-related laboratory As related to obesity and excess sodium intake.  As evidenced by high blood pressure.    Intervention:  Nutrition counseling provided. -Start walking around your apartment complex 3 or 4 times a week (as tolerated) -Increase dietary potassium and lower sodium -Continue to avoid adding salt to foods -Avoid breakfast meats like bacon and sausage -Fill up on non starchy vegetables and season them with salt-free seasoning (Mrs. Deliah Boston)  -Choose fresh or frozen vegetables -Replace potato chips and candy with healthier snacks  -Fruit cups in water, cottage cheese, boiled eggs, yogurt, unsalted nuts  -Boil several eggs at one time to you'll have them  -Continue to choose chips with reduced salt -Avoid canned foods like soup (made enough homemade soup for leftovers) -Keep frozen chicken breast and salmon and tuna cans on hand  -Keep working on cooking at home more than going out -Work on  drinking more water and less soda  Teaching Method Utilized:  Regulatory affairs officer given during visit include:  MyPlate  Potassium-rich foods  Barriers to learning/adherence to lifestyle change: uses public transportation, hard of hearing  Demonstrated degree of understanding via:  Teach Back   Monitoring/Evaluation:  Dietary intake, exercise, and body weight prn.

## 2015-08-22 NOTE — Patient Instructions (Addendum)
-  Start walking around your apartment complex 3 or 4 times a week (as tolerated) -Increase potassium and lower sodium -Continue to avoid adding salt to foods -Avoid breakfast meats like bacon and sausage -Fill up on non starchy vegetables and season them with salt-free seasoning (Mrs. Deliah Boston)  -Choose fresh or frozen vegetables -Replace potato chips and candy with healthier snacks  -Fruit cups in water, cottage cheese, boiled eggs, yogurt, unsalted nuts  -Boil several eggs at one time to you'll have them  -Continue to choose chips with reduced salt -Avoid canned foods like soup (made enough homemade soup for leftovers) -Keep frozen chicken breast and salmon and tuna cans on hand  -Keep working on cooking at home more than going out -Work on drinking more water and less soda   POTASSIUM-RICH foods:  Fruit: ?Avocado, 1/2 avocado has 487 mg ?Cantaloupe, 1 cup has 473 mg ?Honeydew, 1 cup has 404 mg ?Cherries, 1 cup has 306 mg  Vegetables    ?Zucchini,  cup cooked has 280 mg potassium ?Tomato, 1 medium has 292 mg ?Potato, 1/2 cup has 316 mg ?Sweet potato, 1/2 cup has 224 mg  Legumes    ?Soybeans,  cup cooked has 440 mg potassium ?Lentils,  cup cooked has 370 mg potassium ?Kidney beans,  cup cooked has 360 mg potassium ?White beans, canned, drained, half cup: 595 mg  Protein   ?Yogurt, fat-free, 1 cup: 579 mg ?Halibut, 3 ounces, cooked: 490 mg ?Pork tenderloin, 3 ounces, cooked: 382 mg ?Milk, skim or soy, 8 ounces: 340-410 mg ?Salmon, [place], 3 ounces, cooked: 326 mg ?Chicken breast, 3 ounces, cooked: 218 mg ?Tuna, light, canned, drained, 3 ounces: 201 mg

## 2015-08-24 ENCOUNTER — Encounter: Payer: Medicare Other | Admitting: Family Medicine

## 2015-08-30 ENCOUNTER — Telehealth: Payer: Self-pay | Admitting: Family Medicine

## 2015-08-30 MED ORDER — HYDROCHLOROTHIAZIDE 25 MG PO TABS: 12.5000 mg | ORAL_TABLET | Freq: Every day | ORAL | Status: DC

## 2015-08-30 NOTE — Telephone Encounter (Signed)
done

## 2015-08-30 NOTE — Telephone Encounter (Signed)
Will you please refill her HCTz. THANKS

## 2015-08-30 NOTE — Telephone Encounter (Signed)
Pt called for refills of hctz to be sent to [facility] on [address].

## 2015-09-01 ENCOUNTER — Encounter: Payer: Self-pay | Admitting: Physician Assistant

## 2015-09-02 ENCOUNTER — Ambulatory Visit: Payer: Medicare Other | Admitting: Gastroenterology

## 2015-09-06 ENCOUNTER — Encounter (HOSPITAL_COMMUNITY): Payer: Self-pay | Admitting: Vascular Surgery

## 2015-09-06 ENCOUNTER — Emergency Department (HOSPITAL_COMMUNITY)
Admission: EM | Admit: 2015-09-06 | Discharge: 2015-09-06 | Discharge status: Against Medical Advice | Payer: Medicare Other | Attending: Emergency Medicine | Admitting: Emergency Medicine

## 2015-09-06 DIAGNOSIS — Z87891 Personal history of nicotine dependence: Secondary | ICD-10-CM | POA: Insufficient documentation

## 2015-09-06 DIAGNOSIS — M79605 Pain in left leg: Secondary | ICD-10-CM | POA: Diagnosis not present

## 2015-09-06 DIAGNOSIS — I10 Essential (primary) hypertension: Secondary | ICD-10-CM | POA: Insufficient documentation

## 2015-09-06 NOTE — ED Notes (Signed)
Pt reports to the ED for eval of left leg pain that began last week. She denies any injury to the leg. Pt reports her whole leg hurts. She reports she told her PCP about it but did not have any tests done. Pt also reports an intermittent sharp pain in her left calf. Pt reports swelling when she sits for a while. Walking and weight bearing do not affect the pain. Pt requesting X-ray. Ambulatory without difficulty. Denies any long distance travel or HRT. Pt A&Ox4, resp e/u, and skin warm and dry.

## 2015-09-11 HISTORY — PX: BREAST LUMPECTOMY: SHX2

## 2015-09-23 ENCOUNTER — Ambulatory Visit (INDEPENDENT_AMBULATORY_CARE_PROVIDER_SITE_OTHER): Payer: Medicare Other | Admitting: Physician Assistant

## 2015-09-23 ENCOUNTER — Emergency Department (HOSPITAL_COMMUNITY)
Admission: EM | Admit: 2015-09-23 | Discharge: 2015-09-23 | Disposition: A | Discharge status: Home / Self Care | Payer: Medicare Other | Attending: Emergency Medicine | Admitting: Emergency Medicine

## 2015-09-23 ENCOUNTER — Encounter (HOSPITAL_COMMUNITY): Payer: Self-pay | Admitting: Emergency Medicine

## 2015-09-23 ENCOUNTER — Encounter: Payer: Self-pay | Admitting: Physician Assistant

## 2015-09-23 ENCOUNTER — Emergency Department (HOSPITAL_COMMUNITY): Payer: Medicare Other

## 2015-09-23 VITALS — BP 128/62 | HR 62 | Ht 63.0 in | Wt 281.6 lb

## 2015-09-23 DIAGNOSIS — Z79899 Other long term (current) drug therapy: Secondary | ICD-10-CM | POA: Insufficient documentation

## 2015-09-23 DIAGNOSIS — G8929 Other chronic pain: Secondary | ICD-10-CM | POA: Insufficient documentation

## 2015-09-23 DIAGNOSIS — K219 Gastro-esophageal reflux disease without esophagitis: Secondary | ICD-10-CM

## 2015-09-23 DIAGNOSIS — R0789 Other chest pain: Secondary | ICD-10-CM

## 2015-09-23 DIAGNOSIS — R2243 Localized swelling, mass and lump, lower limb, bilateral: Secondary | ICD-10-CM | POA: Diagnosis not present

## 2015-09-23 DIAGNOSIS — I10 Essential (primary) hypertension: Secondary | ICD-10-CM | POA: Insufficient documentation

## 2015-09-23 DIAGNOSIS — M79662 Pain in left lower leg: Secondary | ICD-10-CM | POA: Diagnosis not present

## 2015-09-23 DIAGNOSIS — Z9889 Other specified postprocedural states: Secondary | ICD-10-CM | POA: Diagnosis not present

## 2015-09-23 DIAGNOSIS — Z87891 Personal history of nicotine dependence: Secondary | ICD-10-CM | POA: Insufficient documentation

## 2015-09-23 DIAGNOSIS — M25571 Pain in right ankle and joints of right foot: Secondary | ICD-10-CM | POA: Diagnosis not present

## 2015-09-23 DIAGNOSIS — Z7982 Long term (current) use of aspirin: Secondary | ICD-10-CM | POA: Diagnosis not present

## 2015-09-23 DIAGNOSIS — M25572 Pain in left ankle and joints of left foot: Secondary | ICD-10-CM | POA: Insufficient documentation

## 2015-09-23 DIAGNOSIS — Z1211 Encounter for screening for malignant neoplasm of colon: Secondary | ICD-10-CM | POA: Diagnosis not present

## 2015-09-23 IMAGING — CR DG TIBIA/FIBULA 2V*L*
4 series · 4 of 4 positions shown · non-contrast
Comparison: None.

CLINICAL DATA: Chronic Left tib ifb pain; pt states that her leg
always hurts her, she decided to come to ER today because she was
across the street; no hx left tib fib injury; most pain in anterior
mid tib fib

EXAM:
LEFT TIBIA AND FIBULA - 2 VIEW

[t tib-fib ap left (1 of 2)]
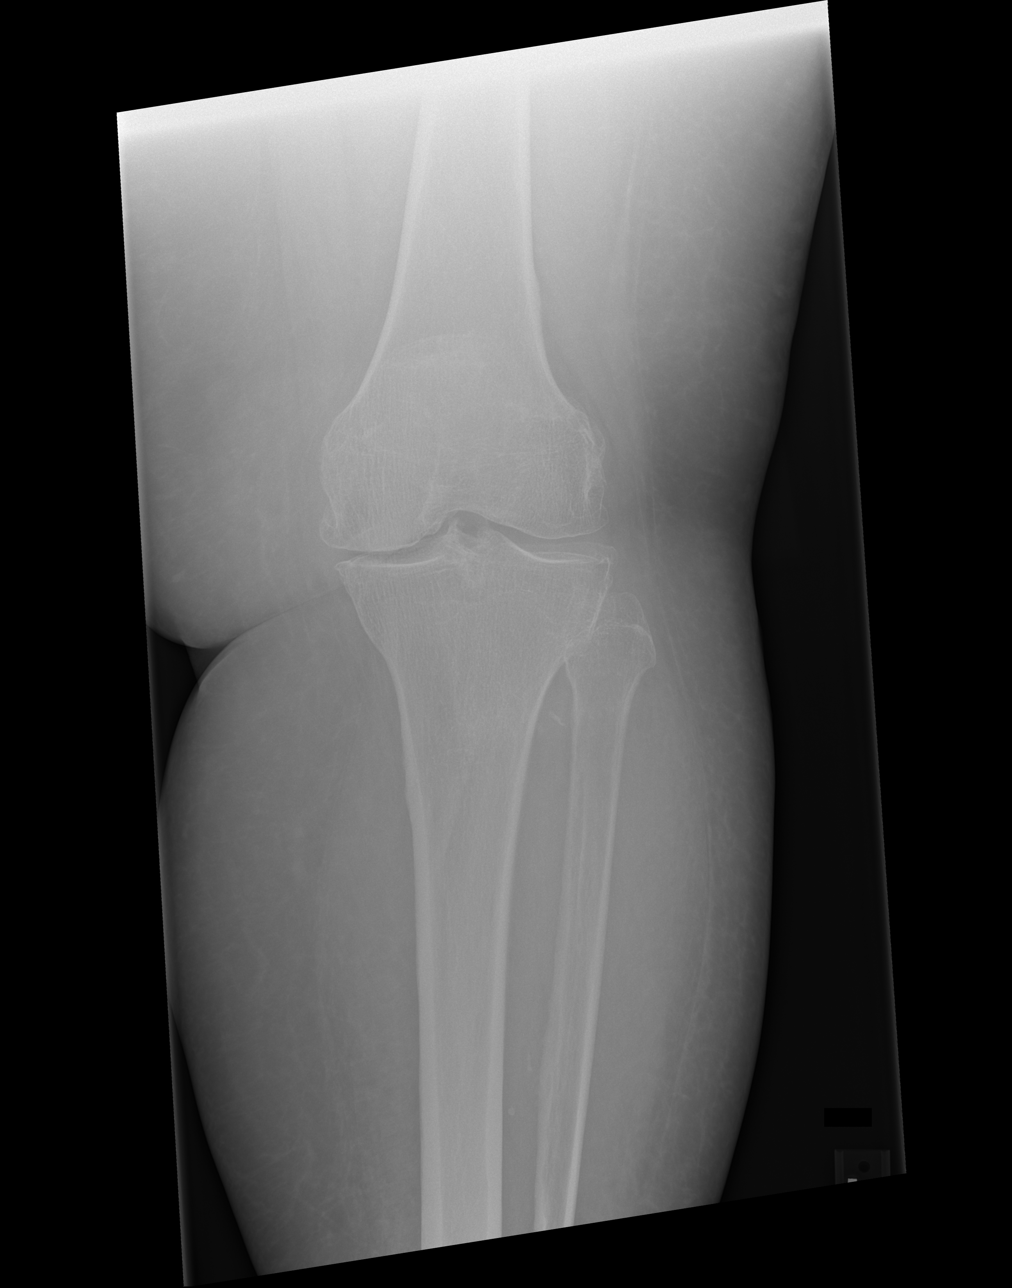

[t tib-fib ap left (2 of 2)]
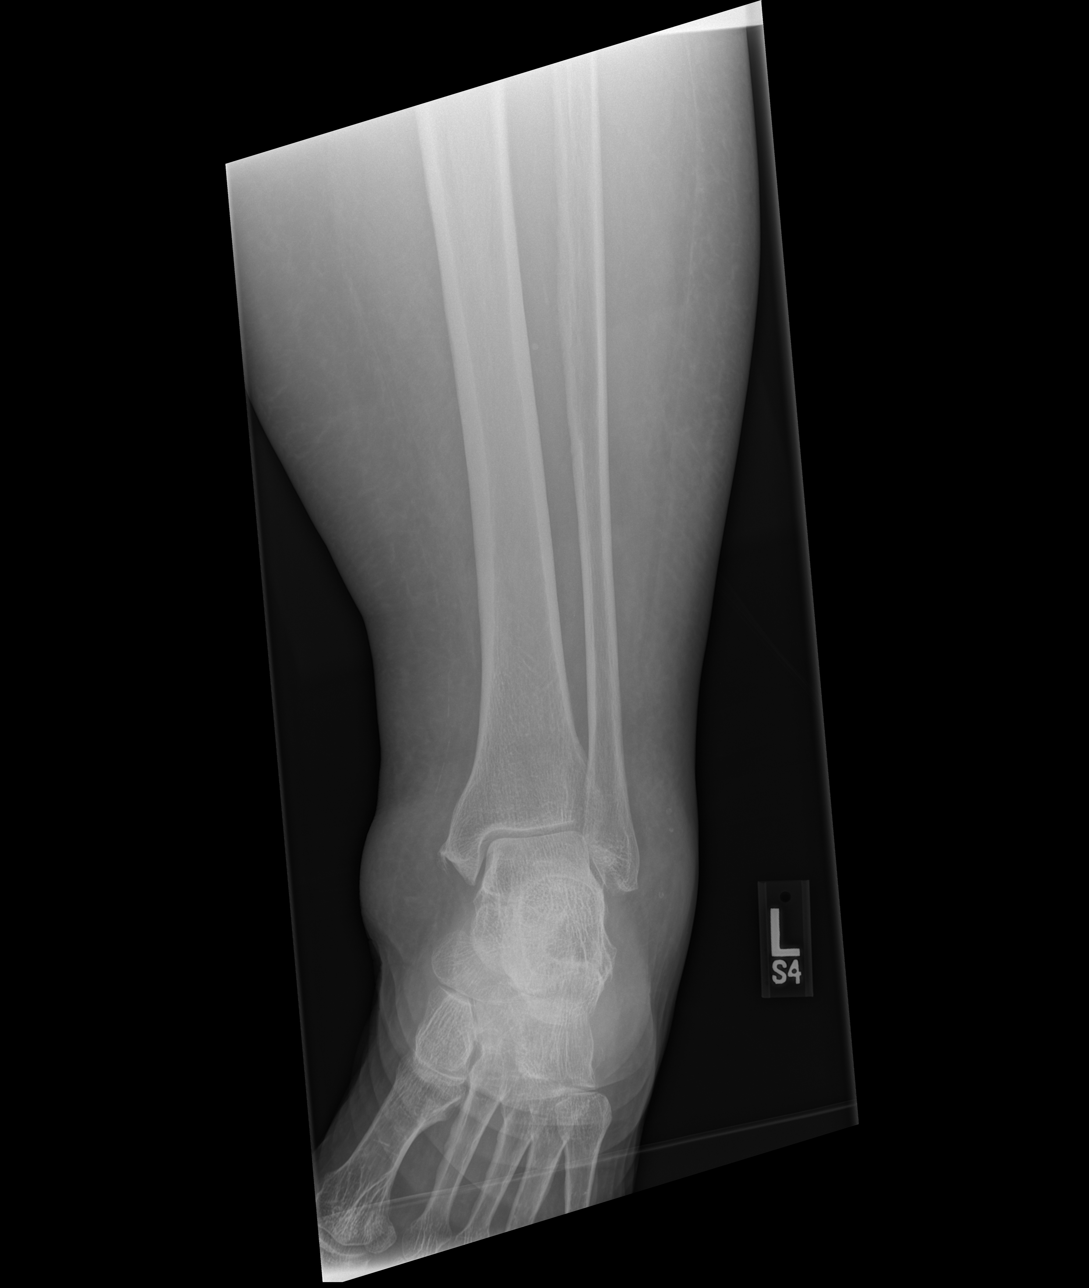

[t tib-fib lat left (1 of 2)]
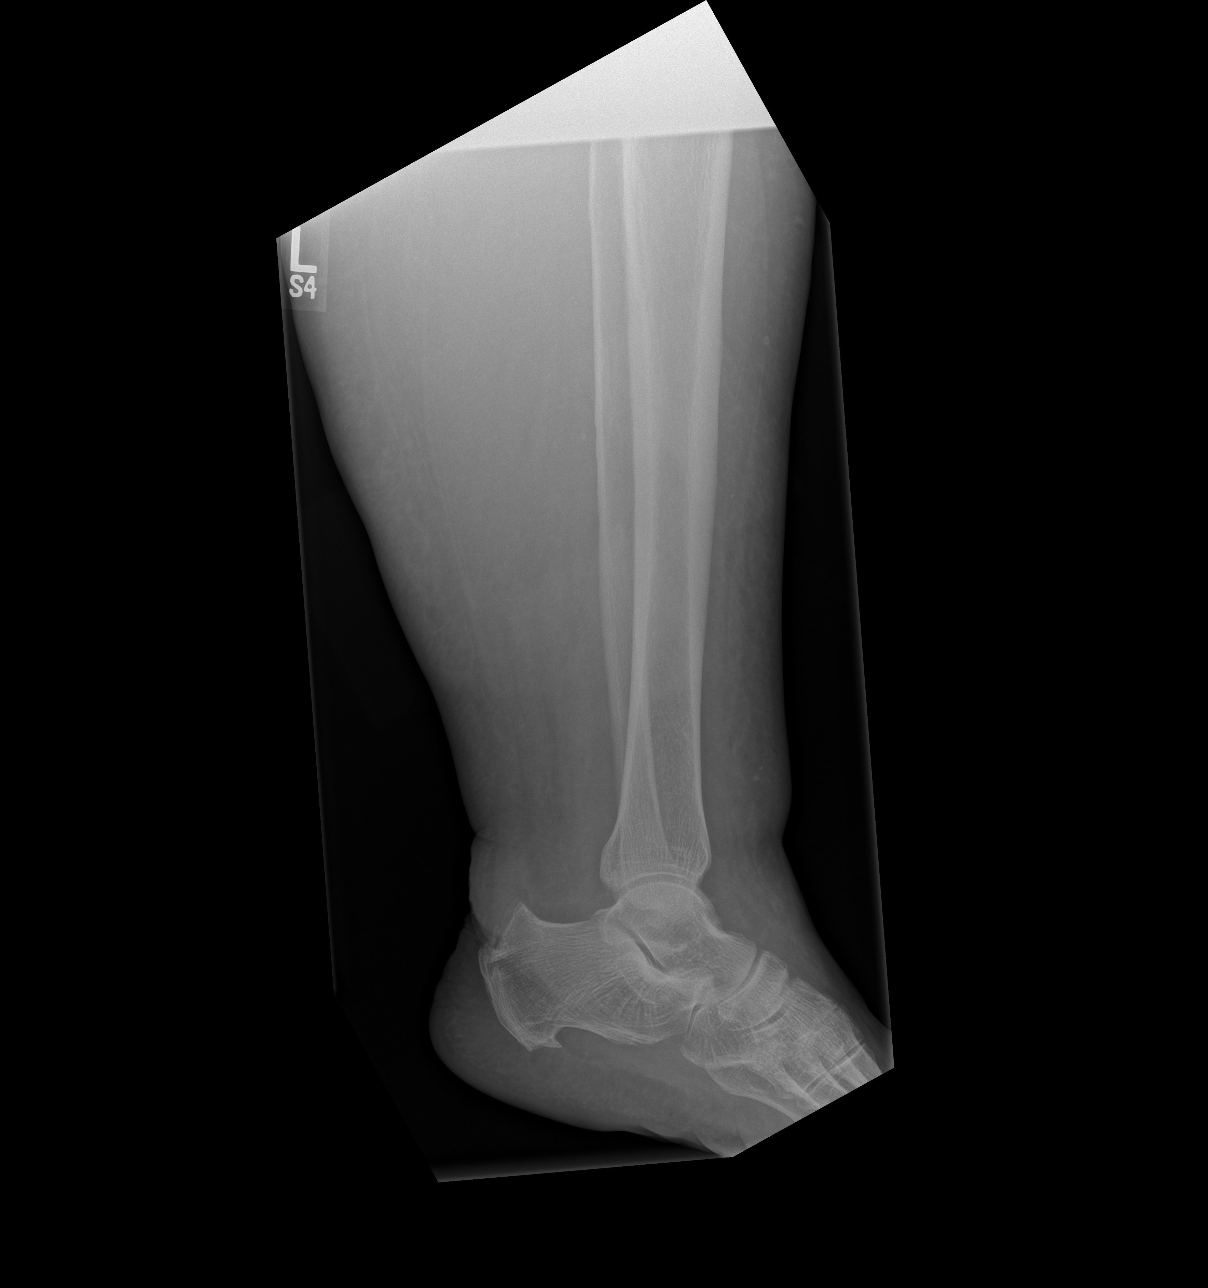

[t tib-fib lat left (2 of 2)]
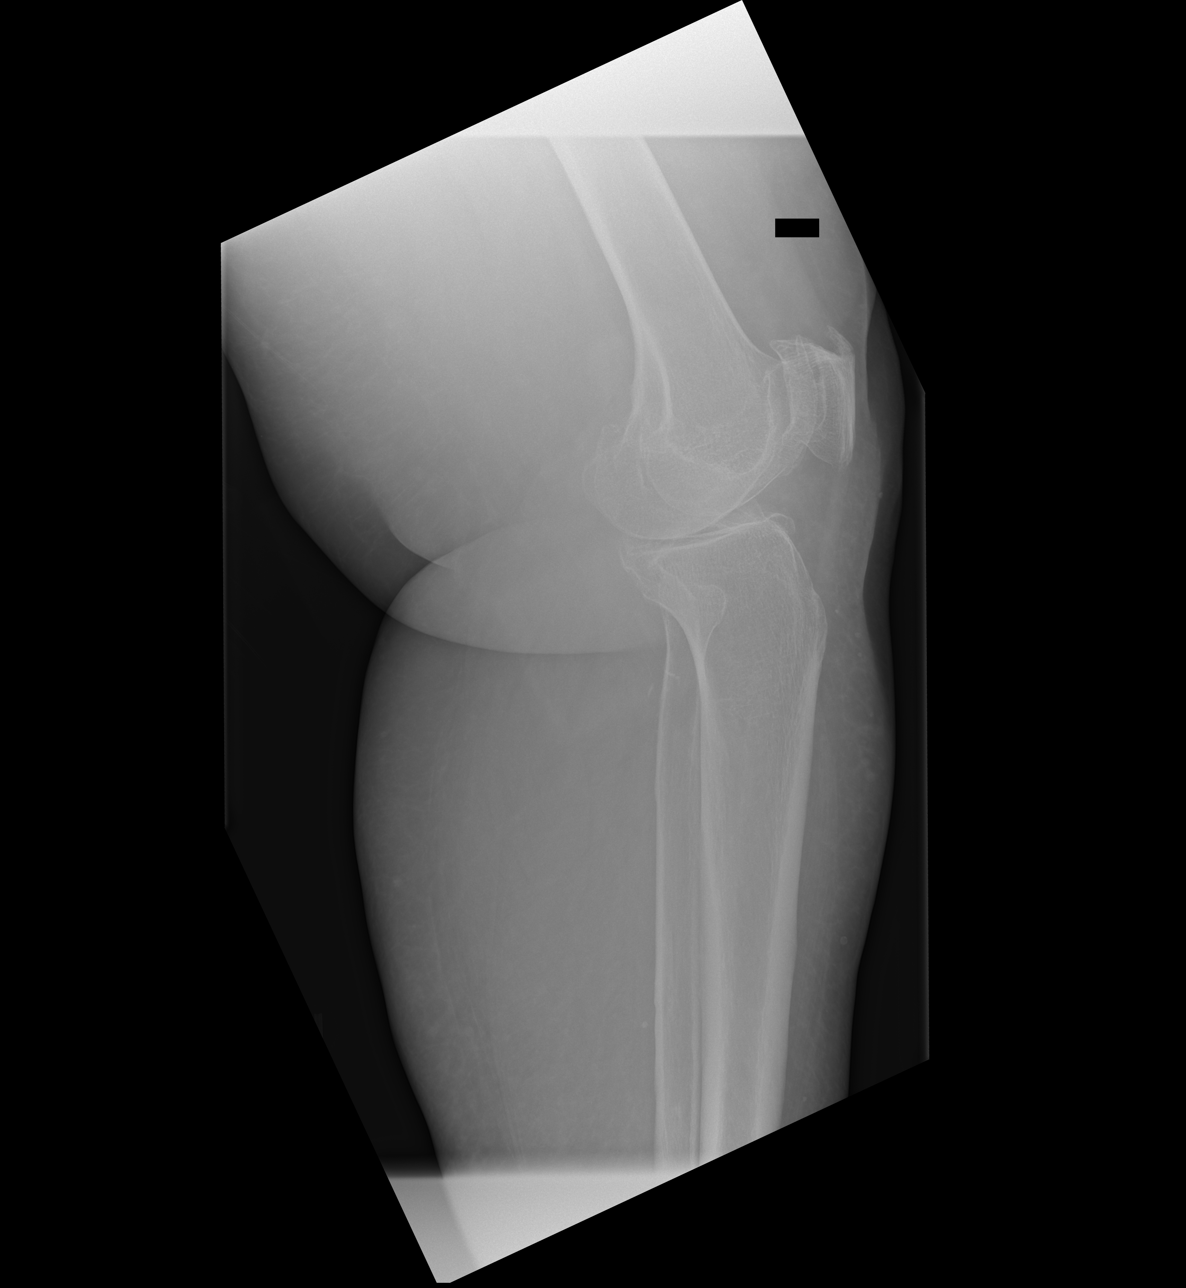

[4 of 4 positions shown; findings below may reference images not displayed]

FINDINGS: No fracture.  No bone lesion.

There knee joint degenerative changes with medial compartment
narrowing and marginal osteophytes from all 3 compartments. Ankle
mortise is normally spaced and aligned.

Soft tissues are unremarkable.
IMPRESSION: 1. No fracture or acute finding.
2. Osteoarthritis of the left knee.

## 2015-09-23 MED ORDER — NA SULFATE-K SULFATE-MG SULF 17.5-3.13-1.6 GM/177ML PO SOLN: 1.0000 | ORAL | Status: AC

## 2015-09-23 NOTE — ED Notes (Signed)
Per pt, states left lower leg pain for years-was seen at [facility] for same symptoms-wants to be worked up for PAD

## 2015-09-23 NOTE — Patient Instructions (Signed)
We have given you a handout on an antireflux regimen.   You have been scheduled for an endoscopy and colonoscopy. Please follow the written instructions given to you at your visit today. Please pick up your prep supplies at the pharmacy within the next 1-3 days. If you use inhalers (even only as needed), please bring them with you on the day of your procedure. Your physician has requested that you go to www.startemmi.com and enter the access code given to you at your visit today. This web site gives a general overview about your procedure. However, you should still follow specific instructions given to you by our office regarding your preparation for the procedure.  Continue taking your Omeprazole as directed.

## 2015-09-23 NOTE — ED Provider Notes (Signed)
CSN: [id number]     Arrival date & time 09/23/15  1146 History  By signing my name below, I, [facility], attest that this documentation has been prepared under the direction and in the presence of [place], PA-C. Electronically Signed: Virgel Bouquet, ED Scribe. 09/23/2015. 1:07 PM.   Chief Complaint  Patient presents with  . Leg Pain   The history is provided by the patient. No language interpreter was used.   HPI Comments: Sheila Jacobs is a 65 y.o. female with an hx of HTN, HLN, obesity, and chronic lower left leg pain who presents to the Emergency Department complaining of intermittent, mild, sharp lower left calf/shin pain that radiates up the left hip, ongoing for several years. Patient endorses bilateral foot pain, intermittent cramping pain in bilateral calves, and edema in the bilateral ankles. Pain is unchanged by ambulation or bearing weight. She has taken aspirin and Tylenol in the past with slight relief but does not recall taking these medications today. Per patient, she notes that her PCP at [place] who has seen her for this problem previously and "mentioned" xrays but pt never had them done.  Pt presents to the ED today requesting these x-rays. She denies recent surgeries. Patient denies color change, back pain, fever, chills, chest pain, abdominal pain, and appetite change.  Denies new injury or change in her chronic pain.  Denies pain currently.   Past Medical History  Diagnosis Date  . Hypertension   . Enlarged LA (left atrium)   . Hyperlipidemia   . Obesity   . Heavy cigarette smoker     Quit 2015  . Morbid obesity ([place])   . GERD (gastroesophageal reflux disease)    Past Surgical History  Procedure Laterality Date  . Tubal ligation    . Left heart catheterization with coronary angiogram N/A 10/21/2014    Procedure: LEFT HEART CATHETERIZATION WITH CORONARY ANGIOGRAM;  Surgeon: Burnell Blanks, MD;  Location: [facility] CATH LAB;   Service: Cardiovascular;  Laterality: N/A;   Family History  Problem Relation Age of Onset  . Hypertension Mother   . Heart disease Mother     Pacemaker  . Kidney disease Mother   . Cataracts Mother   . Hearing loss Mother   . Diabetes Mother   . Diabetes Sister   . Cataracts Brother   . Hearing loss Brother   . Colon cancer Neg Hx    Social History  Substance Use Topics  . Smoking status: Former Smoker -- 1.00 packs/day for 45 years    Types: Cigarettes    Quit date: 08/06/2014  . Smokeless tobacco: Former Systems developer  . Alcohol Use: No   OB History    No data available     Review of Systems  Constitutional: Negative for fever, chills and appetite change.  Respiratory: Negative for shortness of breath.   Cardiovascular: Positive for leg swelling (BLE). Negative for chest pain.  Gastrointestinal: Negative for abdominal pain.  Musculoskeletal: Positive for myalgias (Lower left leg). Negative for back pain.  Skin: Negative for color change, pallor, rash and wound.  Allergic/Immunologic: Negative for immunocompromised state.  Hematological: Does not bruise/bleed easily.  Psychiatric/Behavioral: Negative for self-injury.      Allergies  Cortisone  Home Medications   Prior to Admission medications   Medication Sig Start Date End Date Taking? Authorizing Provider  ALPRAZolam (XANAX) 0.25 MG tablet Take 1 tablet (0.25 mg total) by mouth 2 (two) times daily as needed for anxiety. 08/10/15  Girtha Rm, NP  aspirin 81 MG tablet Take 81 mg by mouth daily.    Historical Provider, MD  calcium citrate-vitamin D (CITRACAL+D) 315-200 MG-UNIT per tablet Take 1 tablet by mouth daily.    Historical Provider, MD  hydrochlorothiazide (HYDRODIURIL) 25 MG tablet Take 0.5 tablets (12.5 mg total) by mouth daily. 08/30/15   Denita Lung, MD  ketoconazole (NIZORAL) 2 % cream APPLY ONE APPLICATION TOPICALLY DAILY 07/28/15   Tamala Fothergill Regal, DPM  Na Sulfate-K Sulfate-Mg Sulf SOLN Take 1 kit  by mouth as directed. 09/23/15 10/23/15  Lori P Hvozdovic, PA-C  omeprazole (PRILOSEC) 20 MG capsule Take 1 capsule (20 mg total) by mouth daily. 05/03/15   Girtha Rm, NP  potassium chloride SA (K-DUR,KLOR-CON) 20 MEQ tablet Take 1 tablet (20 mEq total) by mouth daily. 05/04/15   Vickie L Henson, NP   BP 147/74 mmHg  Pulse 75  Temp(Src) 98 F (36.7 C) (Oral)  Resp 16  SpO2 97% Physical Exam  Constitutional: She appears well-developed and well-nourished. No distress.  HENT:  Head: Normocephalic and atraumatic.  Neck: Neck supple.  Pulmonary/Chest: Effort normal.  Musculoskeletal: She exhibits no tenderness.       Legs: Bilateral edema of ankles. No TTP. FROM of BLE. Spine nontender, no crepitus, or stepoffs. Lower extremities:  Strength 5/5, sensation intact, distal pulses intact.    NO focal tenderness of left lower leg.  No calf tenderness.  No edema.  Bilateral legs are large but visually appear equal in size.  Distal pulses intact.  Pt does have varicose veins in left lower leg.   Gait is normal.    Neurological: She is alert.  Negative straight leg raise. 5/5 strength.  Skin: She is not diaphoretic.  Nursing note and vitals reviewed.   ED Course  Procedures   DIAGNOSTIC STUDIES: Oxygen Saturation is 97% on RA, normal by my interpretation.    COORDINATION OF CARE: 12:18 PM Will order lower left leg x-rays. Discussed treatment plan with pt at bedside and pt agreed to plan.  Labs Review Labs Reviewed - No data to display  Imaging Review No results found. I have personally reviewed and evaluated these images and lab results as part of my medical decision-making.   EKG Interpretation None      MDM   Final diagnoses:  Pain of left lower leg    Afebrile, nontoxic patient with chronic pain in left lower leg x years.  Pt came to ED for xray that she had planned with PCP but had not done.  No clinical e/o DVT.  Given patient's description and exam, doubt  significant PAD of PVD.  Doubt significant radiculopathy or spinal cord/nerve root compression.   Xray negative, does show arthritis of knee.   D/C home with PCP follow up.  Discussed result, findings, treatment, and follow up  with patient.  Pt given return precautions.  Pt verbalizes understanding and agrees with plan.       I personally performed the services described in this documentation, which was scribed in my presence. The recorded information has been reviewed and is accurate.   Clayton Bibles, PA-C 09/23/15 [place], MD 09/24/15 [phone]

## 2015-09-23 NOTE — Progress Notes (Signed)
Reviewed and agree with documentation and assessment and plan. K. Veena Kynadi Dragos , MD   

## 2015-09-23 NOTE — Discharge Instructions (Signed)
Read the information below.  You may return to the Emergency Department at any time for worsening condition or any new symptoms that concern you.   If you develop uncontrolled pain, weakness or numbness of the extremity, severe discoloration of the skin, or you are unable to walk, return to the ER for a recheck.    °

## 2015-09-23 NOTE — H&P (Signed)
Patient ID: Sheila Jacobs, female   DOB: 05-Mar-1951, 65 y.o.   MRN: [id number]    HPI:  Sheila Jacobs is a 65 y.o.   female  referred by Girtha Rm, NP  GERD and epigastric pain, as well as colorectal cancer screening. Sheila Jacobs has a past medical history of cardiomyopathy, hypertension, hyperlipidemia, morbid obesity, and GERD. She was last seen by cardiology in May as a follow-up for chest pain. She had a stress perfusion study consistent with severe inferior ischemia. However, follow-up cardiac catheterization demonstrated normal coronaries. It was felt by cardiology that her chest pain was non-anginal and no further cardiovascular testing was indicated. She does have a long-standing history of GERD and states she was on a medication years ago but felt better and discontinued it. Several months ago her heartburn became worse and she started getting heartburn every day. She would occasionally feel as if she would get a pain in her chest. She was started on omeprazole and feels that this frequency of this pain has diminished significantly. She is frequently nauseous. Her nausea is worse after meals but is not associated with vomiting. She occasionally has a burning epigastric pain with meals denies right upper quadrant pain. She denies early satiety. She denies dysphagia or odynophagia. Her bowel movements have been normal. She reports that she had a colonoscopy 11 years ago at [facility] gastroenterology and was told it was normal. She denies a family history of colon cancer, colon polyps, or inflammatory bowel disease. She does report that she occasionally gets crampy lower abdominal pain prior to defecation, relieved with defecation.   Past Medical History  Diagnosis Date  . Hypertension   . Enlarged LA (left atrium)   . Hyperlipidemia   . Obesity   . Heavy cigarette smoker     Quit 2015  . Morbid obesity ([place])   . GERD (gastroesophageal reflux disease)     Past Surgical History    Procedure Laterality Date  . Tubal ligation    . Left heart catheterization with coronary angiogram N/A 10/21/2014    Procedure: LEFT HEART CATHETERIZATION WITH CORONARY ANGIOGRAM;  Surgeon: Burnell Blanks, MD;  Location: [facility] CATH LAB;  Service: Cardiovascular;  Laterality: N/A;   Family History  Problem Relation Age of Onset  . Hypertension Mother   . Heart disease Mother     Pacemaker  . Kidney disease Mother   . Cataracts Mother   . Hearing loss Mother   . Diabetes Mother   . Diabetes Sister   . Cataracts Brother   . Hearing loss Brother   . Colon cancer Neg Hx    Social History  Substance Use Topics  . Smoking status: Former Smoker -- 1.00 packs/day for 45 years    Types: Cigarettes    Quit date: 08/06/2014  . Smokeless tobacco: Former Systems developer  . Alcohol Use: No   Current Outpatient Prescriptions  Medication Sig Dispense Refill  . ALPRAZolam (XANAX) 0.25 MG tablet Take 1 tablet (0.25 mg total) by mouth 2 (two) times daily as needed for anxiety. 60 tablet 0  . aspirin 81 MG tablet Take 81 mg by mouth daily.    . calcium citrate-vitamin D (CITRACAL+D) 315-200 MG-UNIT per tablet Take 1 tablet by mouth daily.    . hydrochlorothiazide (HYDRODIURIL) 25 MG tablet Take 0.5 tablets (12.5 mg total) by mouth daily. 30 tablet 2  . ketoconazole (NIZORAL) 2 % cream APPLY ONE APPLICATION TOPICALLY DAILY 15 g 11  . Na Sulfate-K  Sulfate-Mg Sulf SOLN Take 1 kit by mouth as directed. 354 mL 0  . omeprazole (PRILOSEC) 20 MG capsule Take 1 capsule (20 mg total) by mouth daily. 30 capsule 3  . potassium chloride SA (K-DUR,KLOR-CON) 20 MEQ tablet Take 1 tablet (20 mEq total) by mouth daily. 30 tablet 3   No current facility-administered medications for this visit.   Allergies  Allergen Reactions  . Cortisone Rash     Review of Systems: Gen: Denies any fever, chills, sweats, anorexia, fatigue, weakness, malaise, weight loss, and sleep disorder CV: Occasional chest pain Resp: Denies  dyspnea at rest, dyspnea with exercise, cough, sputum, wheezing, coughing up blood, and pleurisy. GI: Denies vomiting blood, jaundice, and fecal incontinence.   Denies dysphagia or odynophagia. GU : Denies urinary burning, blood in urine, urinary frequency, urinary hesitancy, nocturnal urination, and urinary incontinence. MS: Denies joint pain, limitation of movement, and swelling, stiffness, low back pain, extremity pain. Denies muscle weakness, cramps, atrophy.  Derm: Denies rash, itching, dry skin, hives, moles, warts, or unhealing ulcers.  Psych: Denies depression, anxiety, memory loss, suicidal ideation, hallucinations, paranoia, and confusion. Heme: Denies bruising, bleeding, and enlarged lymph nodes. Neuro:  Denies any headaches, dizziness, paresthesias. Endo:  Denies any problems with DM, thyroid, adrenal function  Studies: Echocardiogram done 09/18/2014 showed LVEF 60-65% Study Conclusions  - Left ventricle: The cavity size was normal. Systolic function was normal. The estimated ejection fraction was in the range of 60% to 65%. Wall motion was normal; there were no regional wall motion abnormalities. Indeterminate diastolic function. Normal filling pressures. Mild focal basal septal hypertrophy. - Aortic valve: Trileaflet; mildly thickened leaflets. - Mitral valve: There was mild regurgitation. - Left atrium: The atrium was severely dilated. Volume/bsa, S: 42.3 ml/m^2. - Right atrium: The atrium was mildly dilated. - Tricuspid valve: There was mild regurgitation. - Inferior vena cava: The vessel was small, appearing collapsed, consistent with low central venous pressure. LAB RESULTS: Comprehensive metabolic panel 42/39/5320 showed T bili 0.5, alkaline phosphatase 69, AST 19, ALT 14. CBC 06/13/2015 showed WBC 3.7, hemoglobin 11.7, hematocrit 34.6, MCV 94.8, platelets 204,000.     Physical Exam: BP 128/62 mmHg  Pulse 62  Ht _0  (1.6 m)  Wt 281 lb 9.6 oz  (127.733 kg)  BMI 49.90 kg/m2 Constitutional: Pleasant, obese, African-American female in no acute distress. HEENT: Normocephalic and atraumatic. Conjunctivae are normal. No scleral icterus. Neck supple. No JVD Cardiovascular: Normal rate, regular rhythm.  Pulmonary/chest: Effort normal and breath sounds normal. No wheezing, rales or rhonchi. Abdominal: Soft, nondistended, mild epigastric tenderness to palpation with no rebound or guarding. Bowel sounds active throughout. There are no masses palpable. No hepatomegaly. Extremities: no edema Lymphadenopathy: No cervical adenopathy noted. Neurological: Alert and oriented to person place and time. Skin: Skin is warm and dry. No rashes noted. Psychiatric: Normal mood and affect. Behavior is normal.  ASSESSMENT AND PLAN: #1. Colorectal cancer screening. Patient reports that she last had a colonoscopy 11 years ago at [facility] gastroenterology which was normal. Patient has signed a medical release to obtain a copy of her colonoscopy. Patient had been scheduled for a colonoscopy as a direct access colonoscopy in late 2016, however due to her BMI it was canceled and she was told to follow up in the office to be scheduled at the hospital. She will be scheduled for a colonoscopy to evaluate for polyps or neoplasia.The risks, benefits, and alternatives to colonoscopy with possible biopsy and possible polypectomy were discussed with the patient and they  consent to proceed.   #2 GERD. Patient has a long-standing history of GERD and had discontinued her medications. An antireflux regimen has been reviewed. She's been instructed to use her omeprazole 20 mg by mouth every morning 30 minutes prior to breakfast. She does continue to have some nausea despite being on omeprazole for several months. She will be scheduled for an EGD to assess for gastritis, ulcer, etc.The risks, benefits, and alternatives to endoscopy with possible biopsy and possible dilation were discussed  with the patient and they consent to proceed.  The procedures will be scheduled with Dr. Silverio Decamp.   Further recommendations will be made pending the findings of the above.    Montrey Buist, Vita Barley PA-C 09/23/2015, 11:20 AM  CC: Girtha Rm, NP

## 2015-09-27 ENCOUNTER — Other Ambulatory Visit: Payer: Self-pay | Admitting: Emergency Medicine

## 2015-10-04 ENCOUNTER — Ambulatory Visit: Payer: Medicare Other | Admitting: Family Medicine

## 2015-10-07 ENCOUNTER — Encounter: Payer: Self-pay | Admitting: Family Medicine

## 2015-10-07 ENCOUNTER — Ambulatory Visit (INDEPENDENT_AMBULATORY_CARE_PROVIDER_SITE_OTHER): Payer: Medicare Other | Admitting: Family Medicine

## 2015-10-07 VITALS — BP 132/80 | HR 64 | Wt 286.2 lb

## 2015-10-07 DIAGNOSIS — E876 Hypokalemia: Secondary | ICD-10-CM | POA: Diagnosis not present

## 2015-10-07 DIAGNOSIS — K219 Gastro-esophageal reflux disease without esophagitis: Secondary | ICD-10-CM | POA: Diagnosis not present

## 2015-10-07 DIAGNOSIS — H9311 Tinnitus, right ear: Secondary | ICD-10-CM

## 2015-10-07 DIAGNOSIS — I10 Essential (primary) hypertension: Secondary | ICD-10-CM | POA: Diagnosis not present

## 2015-10-07 DIAGNOSIS — H9193 Unspecified hearing loss, bilateral: Secondary | ICD-10-CM

## 2015-10-07 LAB — COMPREHENSIVE METABOLIC PANEL
ALBUMIN: 3.7 g/dL (ref 3.6–5.1)
ALT: 9 U/L (ref 6–29)
AST: 15 U/L (ref 10–35)
Alkaline Phosphatase: 67 U/L (ref 33–130)
BILIRUBIN TOTAL: 0.5 mg/dL (ref 0.2–1.2)
BUN: 11 mg/dL (ref 7–25)
CHLORIDE: 104 mmol/L (ref 98–110)
CO2: 28 mmol/L (ref 20–31)
CREATININE: 0.74 mg/dL (ref 0.50–0.99)
Calcium: 9.3 mg/dL (ref 8.6–10.4)
GLUCOSE: 90 mg/dL (ref 65–99)
Potassium: 4.1 mmol/L (ref 3.5–5.3)
SODIUM: 139 mmol/L (ref 135–146)
Total Protein: 6.9 g/dL (ref 6.1–8.1)

## 2015-10-07 LAB — CBC WITH DIFFERENTIAL/PLATELET
BASOS ABS: 0 10*3/uL (ref 0.0–0.1)
Basophils Relative: 0 % (ref 0–1)
EOS ABS: 0.1 10*3/uL (ref 0.0–0.7)
EOS PCT: 4 % (ref 0–5)
HEMATOCRIT: 35.2 % — AB (ref 36.0–46.0)
Hemoglobin: 12.1 g/dL (ref 12.0–15.0)
LYMPHS ABS: 1.7 10*3/uL (ref 0.7–4.0)
LYMPHS PCT: 47 % — AB (ref 12–46)
MCH: 32.9 pg (ref 26.0–34.0)
MCHC: 34.4 g/dL (ref 30.0–36.0)
MCV: 95.7 fL (ref 78.0–100.0)
MONO ABS: 0.3 10*3/uL (ref 0.1–1.0)
MONOS PCT: 9 % (ref 3–12)
MPV: 10.1 fL (ref 8.6–12.4)
Neutro Abs: 1.5 10*3/uL — ABNORMAL LOW (ref 1.7–7.7)
Neutrophils Relative %: 40 % — ABNORMAL LOW (ref 43–77)
PLATELETS: 216 10*3/uL (ref 150–400)
RBC: 3.68 MIL/uL — ABNORMAL LOW (ref 3.87–5.11)
RDW: 13.2 % (ref 11.5–15.5)
WBC: 3.7 10*3/uL — ABNORMAL LOW (ref 4.0–10.5)

## 2015-10-07 NOTE — Progress Notes (Signed)
Subjective:    Patient ID: Sheila Jacobs, female    DOB: 11/02/1950, 65 y.o.   MRN: [id number]  HPI Chief Complaint  Patient presents with  . follow-up    potassium meds are making her have chest pain but when she doesn't take pill its not there. takes med every other day though but can't take it anymore   She is here for medication check. She states she is taking potassium medications every other day because they give her chest pain and when when she doesn't take them she does not have chest pain. Her weight is up she states she watches too much tv and snacks when she watches tv. She states she is looking for a job and plans to cut back on her snacks and start walking more. She reports hearing loss and has some thumping in her right ear. This has been present for a long time per patient.  She called Dr. Posey Pronto about hearing loss and discomfort to right ear. She states she was told that they only test for hearing and that she would need to see an ENT. She has a hearing aid to her left ear and states she cannot afford one for her right ear.  States she is taking Xanax only 1-2 pills per week and this is helping her knee pain. She is aware they are not pain pills but states they do help her pain.  Denies fever, chills, headache, dizziness, chest pain, palpitations, DOE, GI or GU issus.   Reports taking blood pressure medication without difficulty. Daily aspirin 81 mg  Taking omeprazole once daily for GERD, thinks this is helping.  Has appointment on 02/09 for EGD and colonoscopy.   Outpatient Encounter Prescriptions as of 10/07/2015  Medication Sig  . ALPRAZolam (XANAX) 0.25 MG tablet Take 1 tablet (0.25 mg total) by mouth 2 (two) times daily as needed for anxiety.  Marland Kitchen aspirin 81 MG tablet Take 81 mg by mouth daily.  . calcium citrate-vitamin D (CITRACAL+D) 315-200 MG-UNIT per tablet Take 1 tablet by mouth daily.  . hydrochlorothiazide (HYDRODIURIL) 25 MG tablet Take 0.5 tablets (12.5 mg  total) by mouth daily.  Marland Kitchen ketoconazole (NIZORAL) 2 % cream APPLY ONE APPLICATION TOPICALLY DAILY  . Na Sulfate-K Sulfate-Mg Sulf SOLN Take 1 kit by mouth as directed.  Marland Kitchen omeprazole (PRILOSEC) 20 MG capsule Take 1 capsule (20 mg total) by mouth daily.  . potassium chloride SA (K-DUR,KLOR-CON) 20 MEQ tablet Take 1 tablet (20 mEq total) by mouth daily. (Patient not taking: Reported on 10/07/2015)   No facility-administered encounter medications on file as of 10/07/2015.    Review of Systems Pertinent positives and negatives in the history of present illness.     Objective:   Physical Exam BP 132/80 mmHg  Pulse 64  Wt 286 lb 3.2 oz (129.819 kg)  Alert and in no distress. Right tympanic membranes and canal is normal, Left ear with hearing aid. Finger rub test and whisper test for gross hearing reveals patient unable to hear finger rub or 2 syllable word on either side including with left hearing aid in place. Pharyngeal area is normal. Neck is supple without adenopathy or thyromegaly. Cardiac exam shows a regular sinus rhythm without murmurs or gallops. Lungs are clear to auscultation.      Assessment & Plan:  Hypokalemia - Plan: Comprehensive metabolic panel, CBC with Differential/Platelet  Gastroesophageal reflux disease, esophagitis presence not specified - Plan: Comprehensive metabolic panel, CBC with Differential/Platelet  Essential hypertension -  Plan: Comprehensive metabolic panel, CBC with Differential/Platelet  Morbid obesity, unspecified obesity type ([place]) - Plan: Comprehensive metabolic panel, CBC with Differential/Platelet  Hearing loss, bilateral - Plan: Ambulatory referral to ENT  Tinnitus, right - Plan: Ambulatory referral to ENT  Recommend she continue taking potassium supplements as tolerated and will recheck her potassium today. Discussed continued weight gain and the need to watch calories and increase physical activity.  Will refer to ENT in order to request input  for tinnitus and significant hearing loss.  Blood pressure within goal no changes made.  GERD seems to be improved with daily omeprazole.  Follow up in next month for physical and fasting labs.

## 2015-10-07 NOTE — Patient Instructions (Addendum)
Follow up with ENT on Monday at 10:00 am for right ear discomfort and hearing loss to both ears. You need to schedule a physical exam and fasting blood work with me in the next month. As we discussed, I prefer that you not use the emergency department for non-emergencies and call us instead.  Keep your appointment for colonoscopy and upper endoscopy on February 9. Make sure you have a driver.

## 2015-10-10 ENCOUNTER — Telehealth: Payer: Self-pay | Admitting: Internal Medicine

## 2015-10-10 DIAGNOSIS — H833X3 Noise effects on inner ear, bilateral: Secondary | ICD-10-CM | POA: Diagnosis not present

## 2015-10-10 DIAGNOSIS — H903 Sensorineural hearing loss, bilateral: Secondary | ICD-10-CM | POA: Diagnosis not present

## 2015-10-10 DIAGNOSIS — R42 Dizziness and giddiness: Secondary | ICD-10-CM | POA: Diagnosis not present

## 2015-10-10 NOTE — Telephone Encounter (Signed)
ERROR

## 2015-10-11 ENCOUNTER — Encounter (HOSPITAL_COMMUNITY): Payer: Self-pay | Admitting: *Deleted

## 2015-10-20 ENCOUNTER — Encounter (HOSPITAL_COMMUNITY): Payer: Self-pay

## 2015-10-20 ENCOUNTER — Ambulatory Visit (HOSPITAL_COMMUNITY)
Admission: RE | Admit: 2015-10-20 | Discharge: 2015-10-20 | Disposition: A | Discharge status: Home / Self Care | Payer: Medicare Other | Source: Ambulatory Visit | Attending: Gastroenterology | Admitting: Gastroenterology

## 2015-10-20 ENCOUNTER — Encounter (HOSPITAL_COMMUNITY)
Admission: RE | Disposition: A | Discharge status: Home / Self Care | Payer: Self-pay | Source: Ambulatory Visit | Attending: Gastroenterology

## 2015-10-20 ENCOUNTER — Ambulatory Visit (HOSPITAL_COMMUNITY): Payer: Medicare Other | Admitting: Certified Registered"

## 2015-10-20 ENCOUNTER — Other Ambulatory Visit: Payer: Self-pay

## 2015-10-20 DIAGNOSIS — K648 Other hemorrhoids: Secondary | ICD-10-CM | POA: Diagnosis not present

## 2015-10-20 DIAGNOSIS — Z79899 Other long term (current) drug therapy: Secondary | ICD-10-CM | POA: Diagnosis not present

## 2015-10-20 DIAGNOSIS — Z800 Family history of malignant neoplasm of digestive organs: Secondary | ICD-10-CM | POA: Insufficient documentation

## 2015-10-20 DIAGNOSIS — K222 Esophageal obstruction: Secondary | ICD-10-CM | POA: Insufficient documentation

## 2015-10-20 DIAGNOSIS — K319 Disease of stomach and duodenum, unspecified: Secondary | ICD-10-CM | POA: Diagnosis not present

## 2015-10-20 DIAGNOSIS — E785 Hyperlipidemia, unspecified: Secondary | ICD-10-CM | POA: Diagnosis not present

## 2015-10-20 DIAGNOSIS — Z87891 Personal history of nicotine dependence: Secondary | ICD-10-CM | POA: Diagnosis not present

## 2015-10-20 DIAGNOSIS — R1013 Epigastric pain: Secondary | ICD-10-CM | POA: Insufficient documentation

## 2015-10-20 DIAGNOSIS — Z1211 Encounter for screening for malignant neoplasm of colon: Secondary | ICD-10-CM | POA: Diagnosis not present

## 2015-10-20 DIAGNOSIS — D120 Benign neoplasm of cecum: Secondary | ICD-10-CM

## 2015-10-20 DIAGNOSIS — D124 Benign neoplasm of descending colon: Secondary | ICD-10-CM | POA: Diagnosis not present

## 2015-10-20 DIAGNOSIS — K219 Gastro-esophageal reflux disease without esophagitis: Secondary | ICD-10-CM | POA: Diagnosis not present

## 2015-10-20 DIAGNOSIS — Z6841 Body Mass Index (BMI) 40.0 and over, adult: Secondary | ICD-10-CM | POA: Insufficient documentation

## 2015-10-20 DIAGNOSIS — Z7982 Long term (current) use of aspirin: Secondary | ICD-10-CM | POA: Diagnosis not present

## 2015-10-20 DIAGNOSIS — K2950 Unspecified chronic gastritis without bleeding: Secondary | ICD-10-CM | POA: Diagnosis not present

## 2015-10-20 DIAGNOSIS — R0789 Other chest pain: Secondary | ICD-10-CM

## 2015-10-20 DIAGNOSIS — I429 Cardiomyopathy, unspecified: Secondary | ICD-10-CM | POA: Diagnosis not present

## 2015-10-20 DIAGNOSIS — K635 Polyp of colon: Secondary | ICD-10-CM | POA: Insufficient documentation

## 2015-10-20 DIAGNOSIS — K259 Gastric ulcer, unspecified as acute or chronic, without hemorrhage or perforation: Secondary | ICD-10-CM | POA: Insufficient documentation

## 2015-10-20 DIAGNOSIS — I10 Essential (primary) hypertension: Secondary | ICD-10-CM | POA: Diagnosis not present

## 2015-10-20 DIAGNOSIS — K5730 Diverticulosis of large intestine without perforation or abscess without bleeding: Secondary | ICD-10-CM | POA: Diagnosis not present

## 2015-10-20 DIAGNOSIS — K449 Diaphragmatic hernia without obstruction or gangrene: Secondary | ICD-10-CM | POA: Insufficient documentation

## 2015-10-20 HISTORY — PX: ESOPHAGOGASTRODUODENOSCOPY (EGD) WITH PROPOFOL: SHX5813

## 2015-10-20 HISTORY — PX: COLONOSCOPY WITH PROPOFOL: SHX5780

## 2015-10-20 LAB — HM COLONOSCOPY

## 2015-10-20 SURGERY — COLONOSCOPY WITH PROPOFOL
Anesthesia: Monitor Anesthesia Care

## 2015-10-20 MED ORDER — ONDANSETRON HCL 4 MG/2ML IJ SOLN
INTRAMUSCULAR | Status: DC | PRN
  Administered 2015-10-20 (×2): 2 mg via INTRAVENOUS

## 2015-10-20 MED ORDER — MIDAZOLAM HCL 2 MG/2ML IJ SOLN
INTRAMUSCULAR | Status: AC
  Filled 2015-10-20: qty 2

## 2015-10-20 MED ORDER — LIDOCAINE HCL (CARDIAC) 20 MG/ML IV SOLN
INTRAVENOUS | Status: AC
Start: 2015-10-20 — End: 2015-10-20
  Filled 2015-10-20: qty 5

## 2015-10-20 MED ORDER — LIDOCAINE HCL (CARDIAC) 20 MG/ML IV SOLN
INTRAVENOUS | Status: DC | PRN
  Administered 2015-10-20: 60 mg via INTRAVENOUS

## 2015-10-20 MED ORDER — PROPOFOL 500 MG/50ML IV EMUL
INTRAVENOUS | Status: DC | PRN
  Administered 2015-10-20: 150 ug/kg/min via INTRAVENOUS

## 2015-10-20 MED ORDER — BUTAMBEN-TETRACAINE-BENZOCAINE 2-2-14 % EX AERO
INHALATION_SPRAY | CUTANEOUS | Status: DC | PRN
  Administered 2015-10-20: 2 via TOPICAL

## 2015-10-20 MED ORDER — PROPOFOL 10 MG/ML IV BOLUS
INTRAVENOUS | Status: DC | PRN
  Administered 2015-10-20: 40 mg via INTRAVENOUS

## 2015-10-20 MED ORDER — SODIUM CHLORIDE 0.9 % IV SOLN: INTRAVENOUS | Status: DC

## 2015-10-20 MED ORDER — KETAMINE HCL 10 MG/ML IJ SOLN
INTRAMUSCULAR | Status: DC | PRN
  Administered 2015-10-20: 20 mg via INTRAVENOUS

## 2015-10-20 MED ORDER — PHENYLEPHRINE HCL 10 MG/ML IJ SOLN
INTRAMUSCULAR | Status: DC | PRN
  Administered 2015-10-20: 40 ug via INTRAVENOUS

## 2015-10-20 MED ORDER — PROPOFOL 10 MG/ML IV BOLUS
INTRAVENOUS | Status: AC
  Filled 2015-10-20: qty 20

## 2015-10-20 MED ORDER — KETAMINE HCL 10 MG/ML IJ SOLN
INTRAMUSCULAR | Status: AC
  Filled 2015-10-20: qty 1

## 2015-10-20 MED ORDER — PROPOFOL 10 MG/ML IV BOLUS
INTRAVENOUS | Status: AC
Start: 2015-10-20 — End: 2015-10-20
  Filled 2015-10-20: qty 20

## 2015-10-20 MED ORDER — GLYCOPYRROLATE 0.2 MG/ML IJ SOLN
INTRAMUSCULAR | Status: DC | PRN
  Administered 2015-10-20: 0.2 mg via INTRAVENOUS

## 2015-10-20 MED ORDER — LACTATED RINGERS IV SOLN
INTRAVENOUS | Status: DC
  Administered 2015-10-20: 1000 mL via INTRAVENOUS

## 2015-10-20 MED ORDER — FENTANYL CITRATE (PF) 100 MCG/2ML IJ SOLN: 25.0000 ug | INTRAMUSCULAR | Status: DC | PRN

## 2015-10-20 MED ORDER — GLYCOPYRROLATE 0.2 MG/ML IJ SOLN
INTRAMUSCULAR | Status: AC
  Filled 2015-10-20: qty 1

## 2015-10-20 MED ORDER — MIDAZOLAM HCL 5 MG/5ML IJ SOLN
INTRAMUSCULAR | Status: DC | PRN
  Administered 2015-10-20: 2 mg via INTRAVENOUS

## 2015-10-20 MED ORDER — PHENYLEPHRINE 40 MCG/ML (10ML) SYRINGE FOR IV PUSH (FOR BLOOD PRESSURE SUPPORT)
PREFILLED_SYRINGE | INTRAVENOUS | Status: AC
  Filled 2015-10-20: qty 10

## 2015-10-20 SURGICAL SUPPLY — 25 items

## 2015-10-20 NOTE — Op Note (Signed)
[facility] [place], [address]   COLONOSCOPY PROCEDURE REPORT  PATIENT: Sheila Jacobs, Sheila Jacobs  MR#: [id number] BIRTHDATE: 12-11-1950 , 53  yrs. old GENDER: female ENDOSCOPIST: Harl Bowie, MD REFERRED [id number] Raenette Rover, NP PROCEDURE DATE:  10/20/2015 PROCEDURE:   Colonoscopy, screening, Colonoscopy with cold biopsy polypectomy, and Colonoscopy with snare polypectomy First Screening Colonoscopy - Avg.  risk and is 50 yrs.  old or older - No.  Prior Negative Screening - Now for repeat screening. N/A  History of Adenoma - Now for follow-up colonoscopy & has been > or = to 3 yrs.  N/A  Polyps removed today? Yes ASA CLASS:   Class IV INDICATIONS:Screening for colonic neoplasia and Colorectal Neoplasm Risk Assessment for this procedure is average risk. MEDICATIONS: Monitored anesthesia care  DESCRIPTION OF PROCEDURE:   After the risks benefits and alternatives of the procedure were thoroughly explained, informed consent was obtained.  The digital rectal exam revealed decreased sphincter tone and revealed a normal rectal and perianal exam. The Pentax Slim Colonicsope [id number])  endoscope was introduced through the anus and advanced to the cecum, which was identified by both the appendix and ileocecal valve. No adverse events experienced.   The quality of the prep was good.  The instrument was then slowly withdrawn as the colon was fully examined. Estimated blood loss is zero unless otherwise noted in this procedure report.   COLON FINDINGS: A sessile polyp ranging between 3-22mm in size was found at the cecum.  A polypectomy was performed with cold forceps. The resection was complete, the polyp tissue was completely retrieved and sent to histology.   A sessile polyp ranging between 3-61mm in size was found in the descending colon.  A polypectomy was performed with a cold snare.  The resection was complete, the polyp tissue was completely retrieved and  sent to histology.   There was mild diverticulosis noted in the sigmoid colon.  Retroflexed views revealed internal hemorrhoids. The time to cecum = 0.6 Withdrawal time = 12.4   The scope was withdrawn and the procedure completed. COMPLICATIONS: There were no immediate complications.  ENDOSCOPIC IMPRESSION: 1.   Sessile polyp ranging between 3-2mm in size was found at the cecum; polypectomy was performed with cold forceps 2.   Sessile polyp ranging between 3-30mm in size was found in the descending colon; polypectomy was performed with a cold snare  RECOMMENDATIONS: 1.  If the polyp(s) removed today are proven to be adenomatous (pre-cancerous) polyps, you will need a repeat colonoscopy in 5 years.  Otherwise you should continue to follow colorectal cancer screening guidelines for "routine risk" patients with colonoscopy in 10 years.  You will receive a letter within 1-2 weeks with the results of your biopsy as well as final recommendations.  Please call my office if you have not received a letter after 3 weeks. 2.  Await pathology results  eSigned:  Harl Bowie, MD 10/20/2015 10:30 AM      PATIENT NAME:  Sheila Jacobs, Sheila Jacobs MR#: [id number]

## 2015-10-20 NOTE — Anesthesia Postprocedure Evaluation (Signed)
Anesthesia Post Note  Patient: Sheila Jacobs  Procedure(s) Performed: Procedure(s) (LRB): COLONOSCOPY WITH PROPOFOL (N/A) ESOPHAGOGASTRODUODENOSCOPY (EGD) WITH PROPOFOL (N/A)  Patient location during evaluation: PACU Anesthesia Type: MAC Level of consciousness: awake and alert Pain management: pain level controlled Vital Signs Assessment: post-procedure vital signs reviewed and stable Respiratory status: spontaneous breathing, nonlabored ventilation, respiratory function stable and patient connected to nasal cannula oxygen Cardiovascular status: blood pressure returned to baseline and stable Postop Assessment: no signs of nausea or vomiting Anesthetic complications: no    Last Vitals:  Filed Vitals:   10/20/15 1020 10/20/15 1030  BP: 132/76 144/61  Pulse: 67 66  Temp:    Resp: 13 15    Last Pain: There were no vitals filed for this visit.               Alyssamae Klinck L

## 2015-10-20 NOTE — Interval H&P Note (Signed)
History and Physical Interval Note:  10/20/2015 9:31 AM  Sheila Jacobs  has presented today for surgery, with the diagnosis of screening colon/ GERD/non cardiac chest pain/BMI over 50  The various methods of treatment have been discussed with the patient and family. After consideration of risks, benefits and other options for treatment, the patient has consented to  Procedure(s): COLONOSCOPY WITH PROPOFOL (N/A) ESOPHAGOGASTRODUODENOSCOPY (EGD) WITH PROPOFOL (N/A) as a surgical intervention .  The patient's history has been reviewed, patient examined, no change in status, stable for surgery.  I have reviewed the patient's chart and labs.  Questions were answered to the patient's satisfaction.     Adream Parzych

## 2015-10-20 NOTE — H&P (View-Only) (Signed)
Reviewed and agree with documentation and assessment and plan. K. Veena Nandigam , MD   

## 2015-10-20 NOTE — Discharge Instructions (Signed)
Esophagogastroduodenoscopy, Care After Refer to this sheet in the next few weeks. These instructions provide you with information about caring for yourself after your procedure. Your health care provider may also give you more specific instructions. Your treatment has been planned according to current medical practices, but problems sometimes occur. Call your health care provider if you have any problems or questions after your procedure. WHAT TO EXPECT AFTER THE PROCEDURE After your procedure, it is typical to feel:  Soreness in your throat.  Pain with swallowing.  Sick to your stomach (nauseous).  Bloated.  Dizzy.  Fatigued. HOME CARE INSTRUCTIONS  Do not eat or drink anything until the numbing medicine (local anesthetic) has worn off and your gag reflex has returned. You will know that the local anesthetic has worn off when you can swallow comfortably.  Do not drive or operate machinery until directed by your health care provider.  Take medicines only as directed by your health care provider. SEEK MEDICAL CARE IF:   You cannot stop coughing.  You are not urinating at all or less than usual. SEEK IMMEDIATE MEDICAL CARE IF:  You have difficulty swallowing.  You cannot eat or drink.  You have worsening throat or chest pain.  You have dizziness or lightheadedness or you faint.  You have nausea or vomiting.  You have chills.  You have a fever.  You have severe abdominal pain.  You have black, tarry, or bloody stools.   This information is not intended to replace advice given to you by your health care provider. Make sure you discuss any questions you have with your health care provider.   Document Released: 08/13/2012 Document Revised: 09/17/2014 Document Reviewed: 08/13/2012 Elsevier Interactive Patient Education 2016 [employer]. Colonoscopy, Care After These instructions give you information on caring for yourself after your procedure. Your doctor may also give  you more specific instructions. Call your doctor if you have any problems or questions after your procedure. HOME CARE  Do not drive for 24 hours.  Do not sign important papers or use machinery for 24 hours.  You may shower.  You may go back to your usual activities, but go slower for the first 24 hours.  Take rest breaks often during the first 24 hours.  Walk around or use warm packs on your belly (abdomen) if you have belly cramping or gas.  Drink enough fluids to keep your pee (urine) clear or pale yellow.  Resume your normal diet. Avoid heavy or fried foods.  Avoid drinking alcohol for 24 hours or as told by your doctor.  Only take medicines as told by your doctor. If a tissue sample (biopsy) was taken during the procedure:   Do not take aspirin or blood thinners for 7 days, or as told by your doctor.  Do not drink alcohol for 7 days, or as told by your doctor.  Eat soft foods for the first 24 hours. GET HELP IF: You still have a small amount of blood in your poop (stool) 2-3 days after the procedure. GET HELP RIGHT AWAY IF:  You have more than a small amount of blood in your poop.  You see clumps of tissue (blood clots) in your poop.  Your belly is puffy (swollen).  You feel sick to your stomach (nauseous) or throw up (vomit).  You have a fever.  You have belly pain that gets worse and medicine does not help. MAKE SURE YOU:  Understand these instructions.  Will watch your condition.  Will  get help right away if you are not doing well or get worse.   This information is not intended to replace advice given to you by your health care provider. Make sure you discuss any questions you have with your health care provider.   Document Released: 09/29/2010 Document Revised: 09/01/2013 Document Reviewed: 05/04/2013 Elsevier Interactive Patient Education [employer].

## 2015-10-20 NOTE — Op Note (Signed)
[place], [address]   ENDOSCOPY PROCEDURE REPORT  PATIENT: Sheila Jacobs, Sheila Jacobs  MR#: [id number] BIRTHDATE: 1950/09/29 , 30  yrs. old GENDER: female ENDOSCOPIST: Harl Bowie, MD REFERRED BY:  Harland Dingwall, NP PROCEDURE DATE:  10/20/2015 PROCEDURE:  EGD w/ biopsy for H.pylori ASA CLASS:     Class IV INDICATIONS:  heartburn and dyspepsia. MEDICATIONS: Monitored anesthesia care TOPICAL ANESTHETIC: Cetacaine Spray  DESCRIPTION OF PROCEDURE: After the risks benefits and alternatives of the procedure were thoroughly explained, informed consent was obtained.  The Pentax Gastroscope [id number] endoscope was introduced through the mouth and advanced to the second portion of the duodenum , Without limitations.  The instrument was slowly withdrawn as the mucosa was fully examined.   Esophageal mucosa appeared normal.  Non obstructive schatzki's ring at 35cm.  Small hiatal hernia , regular Z-line at 38cm.  Patchy antral gastropathy with superficial erosions.  Multiple random biopsies obtained to r/o H.pylori.  Duodenum appeared normal. Retroflexed views revealed as previously described.     The scope was then withdrawn from the patient and the procedure completed.  COMPLICATIONS: There were no immediate complications.  ENDOSCOPIC IMPRESSION: Esophageal mucosa appeared normal.  Non obstructive schatzki's ring at 35cm.  Small hiatal hernia , regular Z-line at 38cm.  Patchy antral gastropathy with superficial erosions.  Multiple random biopsies obtained to r/o H.pylori.  Duodenum appeared normal  RECOMMENDATIONS: 1.  Await biopsy results 2.  Avoid NSAIDS for two weeks 3.  Anti-reflux regimen to be follow 4.  Continue PPI and follow up in office visit in 2 months 5.  Proceed with a Colonoscopy.    eSigned:  Harl Bowie, MD 10/20/2015 10:26 AM

## 2015-10-20 NOTE — Transfer of Care (Addendum)
Immediate Anesthesia Transfer of Care Note  Patient: Sheila Jacobs  Procedure(s) Performed: Procedure(s): COLONOSCOPY WITH PROPOFOL (N/A) ESOPHAGOGASTRODUODENOSCOPY (EGD) WITH PROPOFOL (N/A)  Patient Location: PACU  Anesthesia Type:MAC  Level of Consciousness:  sedated, patient cooperative and responds to stimulation  Airway & Oxygen Therapy:Patient Spontanous Breathing and Patient connected to nasal oxgen  Post-op Assessment:  Report given to PACU RN and Post -op Vital signs reviewed and stable  Post vital signs:  Reviewed and stable  Last Vitals:  Filed Vitals:   10/20/15 0850  BP: 127/60  Pulse: 72  Temp: 36.7 C  Resp: 18    Complications: No apparent anesthesia complications

## 2015-10-20 NOTE — Anesthesia Preprocedure Evaluation (Addendum)
Anesthesia Evaluation  Patient identified by MRN, date of birth, ID band Patient awake    Reviewed: Allergy & Precautions, H&P , NPO status , Patient's Chart, lab work & pertinent test results  Airway Mallampati: II  TM Distance: >3 FB Neck ROM: full    Dental  (+) Dental Advisory Given, Partial Upper, Partial Lower   Pulmonary neg pulmonary ROS, former smoker,    Pulmonary exam normal breath sounds clear to auscultation       Cardiovascular Exercise Tolerance: Good hypertension, Pt. on medications Normal cardiovascular exam Rhythm:regular Rate:Normal  Atypical CP   Neuro/Psych vertigo negative neurological ROS  negative psych ROS   GI/Hepatic negative GI ROS, Neg liver ROS, GERD  Medicated and Controlled,  Endo/Other  Morbid obesity  Renal/GU negative Renal ROS  negative genitourinary   Musculoskeletal   Abdominal (+) + obese,   Peds  Hematology negative hematology ROS (+)   Anesthesia Other Findings   Reproductive/Obstetrics negative OB ROS                            Anesthesia Physical Anesthesia Plan  ASA: IV  Anesthesia Plan: MAC   Post-op Pain Management:    Induction:   Airway Management Planned:   Additional Equipment:   Intra-op Plan:   Post-operative Plan:   Informed Consent: I have reviewed the patients History and Physical, chart, labs and discussed the procedure including the risks, benefits and alternatives for the proposed anesthesia with the patient or authorized representative who has indicated his/her understanding and acceptance.   Dental Advisory Given  Plan Discussed with: CRNA  Anesthesia Plan Comments:         Anesthesia Quick Evaluation

## 2015-10-21 ENCOUNTER — Encounter (HOSPITAL_COMMUNITY): Payer: Self-pay | Admitting: Gastroenterology

## 2015-10-23 ENCOUNTER — Encounter: Payer: Self-pay | Admitting: Gastroenterology

## 2015-11-08 ENCOUNTER — Ambulatory Visit (INDEPENDENT_AMBULATORY_CARE_PROVIDER_SITE_OTHER): Payer: Medicare Other | Admitting: Family Medicine

## 2015-11-08 ENCOUNTER — Encounter: Payer: Self-pay | Admitting: Family Medicine

## 2015-11-08 VITALS — BP 132/70 | HR 64 | Ht 64.0 in | Wt 288.2 lb

## 2015-11-08 DIAGNOSIS — Z13820 Encounter for screening for osteoporosis: Secondary | ICD-10-CM

## 2015-11-08 DIAGNOSIS — I10 Essential (primary) hypertension: Secondary | ICD-10-CM | POA: Diagnosis not present

## 2015-11-08 DIAGNOSIS — E2839 Other primary ovarian failure: Secondary | ICD-10-CM

## 2015-11-08 DIAGNOSIS — E049 Nontoxic goiter, unspecified: Secondary | ICD-10-CM | POA: Diagnosis not present

## 2015-11-08 DIAGNOSIS — K219 Gastro-esophageal reflux disease without esophagitis: Secondary | ICD-10-CM

## 2015-11-08 DIAGNOSIS — Z0000 Encounter for general adult medical examination without abnormal findings: Secondary | ICD-10-CM | POA: Diagnosis not present

## 2015-11-08 DIAGNOSIS — R609 Edema, unspecified: Secondary | ICD-10-CM | POA: Diagnosis not present

## 2015-11-08 DIAGNOSIS — Z87891 Personal history of nicotine dependence: Secondary | ICD-10-CM

## 2015-11-08 NOTE — Patient Instructions (Signed)

## 2015-11-08 NOTE — Progress Notes (Signed)
Sheila Jacobs is a 65 y.o. female who presents for annual wellness visit and follow-up on chronic medical conditions.  She has the following concerns: Complains of ongoing "thumping noise" to right ear. She was evaluated by ENT for this and they told her she needs a hearing aid for that ear due to hearing loss. She currently has a hearing aid to right ear but states she cannot afford to get a second one right now.  She has history of reflux and is taking 1 omeprazole pill daily and this seems to be helping. Has an appointment with GI in April for GERD.  Reports good compliance with blood pressure medication. Is taking potassium supplement every other day due to having difficulty swallowing large pills.  She is aware that her BMI places her in severe obese category. She has seen nutritionist but states she is not following orders for eating plan due to expense of foods and lack of motivation. She has not lost any significant weight since our last visit but is holding steady.  History of Goiter and states she had this checked several years ago. She is not sure if the goiter appears larger now but states it is not causing her any problems.  Dependent edema, ongoing and unchanged. She reports sitting a lot and does not elevate her legs typically. Has not tried wearing compression stockings.  She states she does has not needed anxiety medication. Her mood is overall good.  Current immunization status reviewed and she is up-to-date except for Zostavax. Patient was given in the past couple of months but patient states her insurance only covers a small part of it and she cannot afford to have it.  She is not aware of what a health care power attorney is or living will.  She is close to her daughter, her only child. Has never been married.  States she wishes she had not taken an early retirement because now she is on a fixed income and does not like this.     Immunization History  Administered Date(s)  Administered  . Influenza, High Dose Seasonal PF 08/10/2015  . Pneumococcal Conjugate-13 08/10/2015  . Tdap 11/08/2011   Last Pap smear: 2012- denies having abnormal one in past.  Last mammogram: November 2016 Last colonoscopy: 10/20/15  Last DEXA: never Dentist: has not been in years. Needs to fine one. Missing all of upper teeth and has partial to upper.  Ophtho: Dr. Ronnald Ramp- needs reading glasses. 2016.  Exercise: very little physical activity- when she goes places or exercises when she can exercise with the tv.   Other doctors caring for patient include: Dr. [name] cardiologist- has been released, Dr. Silverio Decamp at [place] -recent colonoscopy. Dr Paulla Dolly, Podiatrist   Depression screen:  See questionnaire below.  Depression screen [facility] 2/9 11/08/2015 08/22/2015 07/13/2015 04/19/2015  Decreased Interest 0 0 0 0  Down, Depressed, Hopeless 0 0 0 0  PHQ - 2 Score 0 0 0 0    Fall Risk Screen: see questionnaire below. Fall Risk  11/08/2015 08/22/2015 07/13/2015 04/19/2015  Falls in the past year? No No No No    ADL screen:  See questionnaire below Functional Status Survey: Is the patient deaf or have difficulty hearing?: Yes (needs hearing aid) Does the patient have difficulty seeing, even when wearing glasses/contacts?: No Does the patient have difficulty concentrating, remembering, or making decisions?: No Does the patient have difficulty walking or climbing stairs?: No Does the patient have difficulty dressing or bathing?: No Does  the patient have difficulty doing errands alone such as visiting a doctor's office or shopping?: No   End of Life Discussion:  Patient does not have a living will and medical power of attorney. Forms given to patient.   Review of Systems Constitutional: -fever, -chills, -sweats, -unexpected weight change, -anorexia, -fatigue Allergy: -sneezing, -itching, -congestion Dermatology: denies changing moles, rash, lumps, new worrisome lesions ENT: -runny nose,  -ear pain, -sore throat, +tinnitus, +hearing loss Cardiology:  -chest pain, -palpitations, +edema, -orthopnea Respiratory: -cough, -shortness of breath, -dyspnea on exertion, -wheezing Gastroenterology: -abdominal pain, -nausea, -vomiting, -diarrhea, -constipation, -blood in stool, -changes in bowel movement, -dysphagia Hematology: -bleeding or bruising problems Musculoskeletal: +arthralgias, -myalgias, -joint swelling, -back pain, -neck pain, -cramping Ophthalmology: -vision changes Urology: -dysuria, -difficulty urinating,  -urinary frequency, -urgency, incontinence Neurology: -headache, -weakness, -tingling, -numbness, -speech abnormality, -memory loss, -falls, -dizziness Psychology:  -depressed mood, -agitation, -sleep problems    PHYSICAL EXAM:  BP 132/70 mmHg  Pulse 64  Ht 5\' 4"  (1.626 m)  Wt 288 lb 3.2 oz (130.727 kg)  BMI 49.45 kg/m2  General Appearance: Alert, cooperative, no distress, appears stated age Head: Normocephalic, without obvious abnormality, atraumatic Eyes: PERRL, conjunctiva/corneas clear, EOM's intact, fundi benign Ears: Normal TM's and external ear canals, hearing aid to left ear Nose: Nares normal, mucosa normal, no drainage or sinus  tenderness Throat: Lips, mucosa, and tongue normal; no upper teeth, partial to lower, gums normal Neck: Supple, no lymphadenopathy; thyroid: no enlargement/tenderness/nodules; no carotid bruit or JVD Back: Spine nontender, no curvature, ROM normal, no CVA tenderness Lungs: Clear to auscultation bilaterally without wheezes, rales or ronchi; respirations unlabored Chest Wall: No tenderness or deformity Heart: Regular rate and rhythm, S1 and S2 normal, no murmur, rub or gallop Breast Exam: not performed, mammogram recent. No axillary lymphadenopathy Abdomen: Soft, non-tender, nondistended, severely obese, normoactive bowel sounds, no masses, no hepatosplenomegaly Genitalia: Normal external genitalia without lesions.  BUS and  vagina normal; unable to visualize cervix due to body habitus. No abnormal vaginal discharge.  Uterus and adnexa not enlarged, nontender, no masses.  Pap was not - unable to visualize cervix with largest speculum due to body habitus.  Rectal: Recent colonoscopy normal. Exam not performed Extremities: No clubbing, cyanosis. LE edema, nonpitting Pulses: 2+ and symmetric all extremities Skin: Skin color, texture, turgor normal, no rashes or lesions Lymph nodes: Cervical, supraclavicular, and axillary nodes normal Neurologic: CNII-XII intact, normal strength, sensation and gait; reflexes 2+ and symmetric throughout Psych: Normal mood, affect, hygiene and grooming.  ASSESSMENT/PLAN: Essential hypertension  Gastroesophageal reflux disease, esophagitis presence not specified  Morbid obesity, unspecified obesity type ([place])  Routine general medical examination at a health care facility  Encounter for Medicare annual wellness exam  Former heavy cigarette smoker (20-39 per day)  Goiter - Plan: Ambulatory referral to Endocrinology  Screening for osteoporosis - Plan: DG Bone Density  Dependent edema   HTN- continue on current medication regimen.  Edema- this does not appear to be any worse than last visits, reinforced being more active and elevating legs in evening when resting. Also recommend trying compression stockings. She has been reluctant in past to try this.  Goiter- I have not examined this in past so I cannot discern if this is larger or not. It has been several years since this was evaluated. Discussed with Dr. Redmond School and agree that this sure be further evaluated by Endocrinologist since recent XR commented on trachea deviation most likely resulting from goiter.  Order placed for bone density screening. She  has not had this in past and has been postmenopausal for at least 12 years. Denies any recent falls or bone fracture. Obesity- she has seen nutritionist in the past however, has  not made necessary lifestyle modifications. She reports a desire to lose weight however, she blames financial restraints for not eating healthier. Discussed that even a small amount of weight loss would benefit her greatly. GERD- recommend that she continue watching her food triggers and continue on omeprazole. She will follow up as scheduled with [facility].   Discussed monthly self breast exams and yearly mammograms; at least 30 minutes of aerobic activity at least 5 days/week and weight-bearing exercise 2x/week; proper sunscreen use reviewed; healthy diet, including goals of calcium and vitamin D intake and alcohol recommendations (less than or equal to 1 drink/day) reviewed; regular seatbelt use; changing batteries in smoke detectors.  Immunization recommendations discussed, she is up to date at present except for Zostavax. She reports unable to afford the vaccine.  Colonoscopy recent and due in 10 years.    Medicare Attestation I have personally reviewed: The patient's medical and social history Their use of alcohol, tobacco or illicit drugs Their current medications and supplements The patient's functional ability including ADLs,fall risks, home safety risks, cognitive, and hearing and visual impairment Diet and physical activities Evidence for depression or mood disorders  The patient's weight, height, and BMI have been recorded in the chart.  I have made referrals, counseling, and provided education to the patient based on review of the above and I have provided the patient with a written personalized care plan for preventive services.     Harland Dingwall, NP   11/08/2015

## 2015-11-25 ENCOUNTER — Telehealth: Payer: Self-pay | Admitting: Internal Medicine

## 2015-11-25 NOTE — Telephone Encounter (Signed)
Amber from [place] called and states that they can not schedule pt's bone density until the dx code is changed. Please put in another dx code, you can go back to the visit 2/28 and changed code

## 2015-11-25 NOTE — Telephone Encounter (Signed)
Changed dx code from visit to E28.39 estrogen defiency

## 2015-11-25 NOTE — Telephone Encounter (Signed)
I attempted to go back to the visit on 02/28 and change this but could not. Please take care of this. Diagnosis code can be changed to estrogen deficiency.

## 2015-11-28 ENCOUNTER — Ambulatory Visit (INDEPENDENT_AMBULATORY_CARE_PROVIDER_SITE_OTHER): Payer: Medicare Other | Admitting: Endocrinology

## 2015-11-28 ENCOUNTER — Encounter: Payer: Self-pay | Admitting: Endocrinology

## 2015-11-28 VITALS — BP 116/68 | HR 84 | Temp 98.0°F | Resp 16 | Ht 63.0 in | Wt 290.2 lb

## 2015-11-28 DIAGNOSIS — E012 Iodine-deficiency related (endemic) goiter, unspecified: Secondary | ICD-10-CM

## 2015-11-28 DIAGNOSIS — E041 Nontoxic single thyroid nodule: Secondary | ICD-10-CM

## 2015-11-28 DIAGNOSIS — E040 Nontoxic diffuse goiter: Secondary | ICD-10-CM | POA: Diagnosis not present

## 2015-11-28 NOTE — Progress Notes (Signed)
Patient ID: Sheila Jacobs, female   DOB: [birth year], 65 y.o.   MRN: [id number]           Reason for Appointment: Goiter, new consultation    History of Present Illness:   The patient's thyroid enlargement was first discovered in 2009 probably on her routine physical exam  [facility]   She however does not know when her goiter first started    On her initial evaluation she did have needle biopsy of her large right-sided nodule which was benign   She has never been on thyroid supplements   More recently because of her goiter seen on a routine exam she is being sent here for further management  She has had No difficulty with swallowing   Does not feel like she has any choking sensation in her neck or pressure in any position or when lying down.  She does not think that her thyroid swelling has increased in size and she does not feel any local discomfort    She has had normal thyroid function tests consistently  Lab Results  Component Value Date   FREET4 1.23 02/16/2008   FREET4 1.14 10/10/2007   TSH 0.526 07/13/2015   TSH 0.398 10/19/2014   TSH 0.543 09/18/2014    She has had an ultrasound exam in  2009  Thyroid biopsy in  2009  Showed nonneoplastic goiter      Medication List       This list is accurate as of: 11/28/15 11:59 PM.  Always use your most recent med list.               acetaminophen 500 MG tablet  Commonly known as:  TYLENOL  Take 500 mg by mouth every 6 (six) hours as needed (Pain).     ALPRAZolam 0.25 MG tablet  Commonly known as:  XANAX  Take 1 tablet (0.25 mg total) by mouth 2 (two) times daily as needed for anxiety.     aspirin 81 MG tablet  Take 81 mg by mouth daily.     calcium citrate-vitamin D 315-200 MG-UNIT tablet  Commonly known as:  CITRACAL+D  Take 1 tablet by mouth daily.     hydrochlorothiazide 25 MG tablet  Commonly known as:  HYDRODIURIL  Take 0.5 tablets (12.5 mg total) by mouth  daily.     ketoconazole 2 % cream  Commonly known as:  NIZORAL  APPLY ONE APPLICATION TOPICALLY DAILY     omeprazole 20 MG capsule  Commonly known as:  PRILOSEC  Take 1 capsule (20 mg total) by mouth daily.     potassium chloride SA 20 MEQ tablet  Commonly known as:  K-DUR,KLOR-CON  Take 1 tablet (20 mEq total) by mouth daily.        Allergies:  Allergies  Allergen Reactions  . Cortisone Rash    Past Medical History  Diagnosis Date  . Hypertension   . Enlarged LA (left atrium)   . Hyperlipidemia   . Obesity   . Heavy cigarette smoker     Quit 2015  . Morbid obesity ([place])   . GERD (gastroesophageal reflux disease)   . Vertigo     on occasion    Past Surgical History  Procedure Laterality Date  . Tubal ligation    . Left heart catheterization with coronary angiogram N/A 10/21/2014    Procedure: LEFT HEART CATHETERIZATION WITH CORONARY ANGIOGRAM;  Surgeon: Burnell Blanks, MD;  Location:  [facility];  Service: Cardiovascular;  Laterality: N/A;  . Colonoscopy with propofol N/A 10/20/2015    Procedure: COLONOSCOPY WITH PROPOFOL;  Surgeon: Mauri Pole, MD;  Location: [facility];  Service: Endoscopy;  Laterality: N/A;  . Esophagogastroduodenoscopy (egd) with propofol N/A 10/20/2015    Procedure: ESOPHAGOGASTRODUODENOSCOPY (EGD) WITH PROPOFOL;  Surgeon: Mauri Pole, MD;  Location: [facility];  Service: Endoscopy;  Laterality: N/A;    Family History  Problem Relation Age of Onset  . Hypertension Mother   . Heart disease Mother     Pacemaker  . Kidney disease Mother   . Cataracts Mother   . Hearing loss Mother   . Diabetes Mother   . Diabetes Sister   . Cataracts Brother   . Hearing loss Brother   . Colon cancer Neg Hx   . Thyroid disease Neg Hx     Social History:  reports that she quit smoking about 16 months ago. Her smoking use included Cigarettes. She has a 45 pack-year smoking history. She has quit using smokeless tobacco. She reports  that she does not drink alcohol or use illicit drugs.    Review of Systems  Constitutional: Negative for weight loss.  HENT: Negative for trouble swallowing.   Respiratory: Negative for shortness of breath.   Cardiovascular: Negative for leg swelling.  Endocrine: Negative for fatigue.  Musculoskeletal: Positive for joint pain.  Psychiatric/Behavioral: Positive for nervousness.      Examination:   BP 116/68 mmHg  Pulse 84  Temp(Src) 98 F (36.7 C)  Resp 16  Ht 5\' 3"  (1.6 m)  Wt 290 lb 3.2 oz (131.634 kg)  BMI 51.42 kg/m2  SpO2 95%   General Appearance: pleasant,          Eyes: No abnormal prominence or eyelid swelling.          Neck: The thyroid is enlarged mostly on the right side, about 4-5 times normal, smooth, relatively soft. Left-sided not clearly palpable Neck circumference is 42 cm over the thyroid There is no stridor. Pemberton sign is negative There is no lymphadenopathy.     Cardiovascular: Normal  heart sounds, no murmur Respiratory:  Lungs clear Neurological: REFLEXES: at biceps are normal.  Skin: no rash        Assessment/Plan:   Long-standing goiter with mostly right sided enlargement   Clinically she appears to  have a colloid nodule which was benign on biopsy in 2009  She is asymptomatic , currently not having any pressure symptoms of choking or swallowing difficulty despite the large size and stable deviation of trachea seen on the x-ray    Her thyroid levels have been consistently normal , TSH usually below 1.0 which would preclude any  role for thyroid suppression or supplementation   Since she has had a biopsy confirmed benign nodule on the right side and this is asymptomatic as well as not showing any objective evidence of tracheal compression she does not need any further evaluation or intervention  She can come back annually for follow-up   [facility] 11/29/2015

## 2015-12-30 ENCOUNTER — Ambulatory Visit (INDEPENDENT_AMBULATORY_CARE_PROVIDER_SITE_OTHER): Payer: Medicare Other | Admitting: Gastroenterology

## 2015-12-30 ENCOUNTER — Encounter: Payer: Self-pay | Admitting: Gastroenterology

## 2015-12-30 VITALS — BP 124/68 | HR 72 | Ht 62.75 in | Wt 295.2 lb

## 2015-12-30 DIAGNOSIS — K219 Gastro-esophageal reflux disease without esophagitis: Secondary | ICD-10-CM | POA: Diagnosis not present

## 2015-12-30 DIAGNOSIS — R0789 Other chest pain: Secondary | ICD-10-CM | POA: Diagnosis not present

## 2015-12-30 NOTE — Progress Notes (Signed)
Sheila Jacobs    [id number]    July 16, 1951  Primary Care Physician:Vickie Raenette Rover, NP  Referring Physician: Girtha Rm, NP [address] [address], [place] [id number]  Chief complaint:  GERD, chest pain HPI: 4 yr F with h/o cardiomyopathy, hypertension, hyperlipidemia, morbid obesity, and GERD with c/o intermittent chest pain. She had a stress perfusion study consistent with severe inferior ischemia. However, follow-up cardiac catheterization demonstrated normal coronaries. It was felt by cardiology that her chest pain was non-anginal and no further cardiovascular testing was indicated. She had EGD and colonoscopy in Feb 2017, showed chronic inactive gastritis, H.pylori negative. Sessile polyps removed from colon were hyperplastic/lymphoid aggregates.  She reports taking PPI only as needed and is not taking on consistent basis, she is also gaining weight. Denies any nausea, vomiting, abdominal pain, melena or bright red blood per rectum. No dysphagia or odynophagia.  Outpatient Encounter Prescriptions as of 12/30/2015  Medication Sig  . acetaminophen (TYLENOL) 500 MG tablet Take 500 mg by mouth every 6 (six) hours as needed (Pain).  Marland Kitchen aspirin 81 MG tablet Take 81 mg by mouth daily.  . calcium citrate-vitamin D (CITRACAL+D) 315-200 MG-UNIT per tablet Take 1 tablet by mouth daily.  . hydrochlorothiazide (HYDRODIURIL) 25 MG tablet Take 0.5 tablets (12.5 mg total) by mouth daily.  Marland Kitchen ketoconazole (NIZORAL) 2 % cream APPLY ONE APPLICATION TOPICALLY DAILY (Patient taking differently: APPLY ONE APPLICATION TOPICALLY DAILY AS NEEDED FOR TOE IRRITATION)  . omeprazole (PRILOSEC) 20 MG capsule Take 1 capsule (20 mg total) by mouth daily.  . potassium chloride SA (K-DUR,KLOR-CON) 20 MEQ tablet Take 1 tablet (20 mEq total) by mouth daily. (Patient taking differently: Take 20 mEq by mouth every other day. )  . [DISCONTINUED] ALPRAZolam (XANAX) 0.25 MG tablet Take 1 tablet (0.25 mg total)  by mouth 2 (two) times daily as needed for anxiety.   No facility-administered encounter medications on file as of 12/30/2015.    Allergies as of 12/30/2015 - Review Complete 12/30/2015  Allergen Reaction Noted  . Cortisone Rash 10/06/2014    Past Medical History  Diagnosis Date  . Hypertension   . Enlarged LA (left atrium)   . Hyperlipidemia   . Obesity   . Heavy cigarette smoker     Quit 2015  . Morbid obesity ([place])   . GERD (gastroesophageal reflux disease)   . Vertigo     on occasion  . Colon polyps     Past Surgical History  Procedure Laterality Date  . Tubal ligation    . Left heart catheterization with coronary angiogram N/A 10/21/2014    Procedure: LEFT HEART CATHETERIZATION WITH CORONARY ANGIOGRAM;  Surgeon: Burnell Blanks, MD;  Location: [facility] CATH LAB;  Service: Cardiovascular;  Laterality: N/A;  . Colonoscopy with propofol N/A 10/20/2015    Procedure: COLONOSCOPY WITH PROPOFOL;  Surgeon: Mauri Pole, MD;  Location: [facility];  Service: Endoscopy;  Laterality: N/A;  . Esophagogastroduodenoscopy (egd) with propofol N/A 10/20/2015    Procedure: ESOPHAGOGASTRODUODENOSCOPY (EGD) WITH PROPOFOL;  Surgeon: Mauri Pole, MD;  Location: [facility];  Service: Endoscopy;  Laterality: N/A;    Family History  Problem Relation Age of Onset  . Hypertension Mother   . Heart disease Mother     Pacemaker  . Kidney disease Mother   . Cataracts Mother   . Hearing loss Mother   . Diabetes Mother   . Diabetes Sister   . Cataracts Brother   .  Hearing loss Brother   . Colon cancer Neg Hx   . Thyroid disease Neg Hx     Social History   Social History  . Marital Status: Single    Spouse Name: N/A  . Number of Children: 1  . Years of Education: N/A   Occupational History  . Not on file.   Social History Main Topics  . Smoking status: Former Smoker -- 1.00 packs/day for 45 years    Types: Cigarettes    Quit date: 07/11/2014  . Smokeless tobacco:  Former Systems developer  . Alcohol Use: No  . Drug Use: No  . Sexual Activity: Not on file   Other Topics Concern  . Not on file   Social History Narrative   Lives with daughter.        Review of systems: Review of Systems  Constitutional: Negative for fever and chills.  HENT: Negative.   Eyes: Negative for blurred vision.  Respiratory: Negative for cough, shortness of breath and wheezing.   Cardiovascular: Negative for chest pain and palpitations.  Gastrointestinal: as per HPI Genitourinary: Negative for dysuria, urgency, frequency and hematuria.  Musculoskeletal: Negative for myalgias, back pain and joint pain.  Skin: Negative for itching and rash.  Neurological: Negative for dizziness, tremors, focal weakness, seizures and loss of consciousness.  Endo/Heme/Allergies: Negative for environmental allergies.  Psychiatric/Behavioral: Negative for depression, suicidal ideas and hallucinations.  All other systems reviewed and are negative.   Physical Exam: Filed Vitals:   12/30/15 0925  BP: 124/68  Pulse: 72   Gen:      No acute distress, morbidly obese HEENT:  EOMI, sclera anicteric Neck:     No masses; no thyromegaly Lungs:    Clear to auscultation bilaterally; normal respiratory effort CV:         Regular rate and rhythm; no murmurs Abd:      + bowel sounds; soft, non-tender; no palpable masses, no distension Ext:    No edema; adequate peripheral perfusion Skin:      Warm and dry; no rash Neuro: alert and oriented x 3 Psych: normal mood and affect  Data Reviewed: Reviewed chart in epic and pertinent past GI work up   Assessment and Plan/Recommendations:  68 yr F with morbid obesity, chronic GERD and cardiomyopathy here with intermittent atypical chest pain likely related to uncontrolled acid reflux She had extensive cardiac work up with no evidence of coronary artery disease Advised patient to PPI daily, 30 mins before breakfast Follow anti reflux measures Discussed diet  and exercise to loose weight Recall colonoscopy for colorectal screening in 10 years Return in 1 yr or sooner if needed  [name] , [place] [phone] Mon-Fri 8a-5p [phone] after 5p, weekends, holidays

## 2015-12-30 NOTE — Patient Instructions (Signed)
Follow up in 1 year.

## 2016-01-03 ENCOUNTER — Other Ambulatory Visit: Payer: Self-pay | Admitting: Family Medicine

## 2016-01-03 ENCOUNTER — Telehealth: Payer: Self-pay | Admitting: Family Medicine

## 2016-01-03 DIAGNOSIS — N6320 Unspecified lump in the left breast, unspecified quadrant: Secondary | ICD-10-CM

## 2016-01-03 MED ORDER — ALPRAZOLAM 0.25 MG PO TABS
0.2500 mg | ORAL_TABLET | Freq: Two times a day (BID) | ORAL | Status: DC | PRN
Start: 2016-01-03 — End: 2016-07-23

## 2016-01-03 NOTE — Telephone Encounter (Signed)
This is ok to refill for her, she does not appear to be needing this that often. #60, no refills.

## 2016-01-03 NOTE — Telephone Encounter (Signed)
Called in med to pharmacy  

## 2016-01-03 NOTE — Telephone Encounter (Signed)
Rcvd refill request for Xanax 0.25 mg #60

## 2016-01-05 ENCOUNTER — Telehealth: Payer: Self-pay | Admitting: Family Medicine

## 2016-01-05 NOTE — Telephone Encounter (Signed)
Pt called and states that she needs a referral to the neurologist states that she has bumps in her ears and that she is hearing noise and thinks that it is coming from down further and that her hearing was tested Friday and that it was fine, pt can be  reached at [phone] (H)

## 2016-01-05 NOTE — Telephone Encounter (Signed)
Pt coming in Monday.

## 2016-01-05 NOTE — Telephone Encounter (Signed)
Patient will need to be seen. Cannot refer her to neurologist for these complaints.

## 2016-01-09 ENCOUNTER — Institutional Professional Consult (permissible substitution): Payer: Medicare Other | Admitting: Family Medicine

## 2016-01-09 HISTORY — PX: BREAST BIOPSY: SHX20

## 2016-01-16 ENCOUNTER — Other Ambulatory Visit: Payer: Self-pay | Admitting: Family Medicine

## 2016-01-16 ENCOUNTER — Encounter: Payer: Self-pay | Admitting: Family Medicine

## 2016-01-16 ENCOUNTER — Ambulatory Visit (INDEPENDENT_AMBULATORY_CARE_PROVIDER_SITE_OTHER): Payer: Medicare Other | Admitting: Family Medicine

## 2016-01-16 VITALS — BP 126/60 | HR 64 | Ht 64.25 in | Wt 289.6 lb

## 2016-01-16 DIAGNOSIS — K2100 Gastro-esophageal reflux disease with esophagitis, without bleeding: Secondary | ICD-10-CM

## 2016-01-16 DIAGNOSIS — Z87891 Personal history of nicotine dependence: Secondary | ICD-10-CM | POA: Diagnosis not present

## 2016-01-16 DIAGNOSIS — E876 Hypokalemia: Secondary | ICD-10-CM | POA: Diagnosis not present

## 2016-01-16 DIAGNOSIS — R200 Anesthesia of skin: Secondary | ICD-10-CM

## 2016-01-16 DIAGNOSIS — R202 Paresthesia of skin: Secondary | ICD-10-CM | POA: Diagnosis not present

## 2016-01-16 DIAGNOSIS — R05 Cough: Secondary | ICD-10-CM | POA: Diagnosis not present

## 2016-01-16 DIAGNOSIS — R079 Chest pain, unspecified: Secondary | ICD-10-CM | POA: Diagnosis not present

## 2016-01-16 DIAGNOSIS — H9311 Tinnitus, right ear: Secondary | ICD-10-CM

## 2016-01-16 DIAGNOSIS — R059 Cough, unspecified: Secondary | ICD-10-CM

## 2016-01-16 DIAGNOSIS — R209 Unspecified disturbances of skin sensation: Secondary | ICD-10-CM | POA: Diagnosis not present

## 2016-01-16 DIAGNOSIS — R946 Abnormal results of thyroid function studies: Secondary | ICD-10-CM | POA: Diagnosis not present

## 2016-01-16 LAB — TSH: TSH: 0.26 mIU/L — ABNORMAL LOW

## 2016-01-16 LAB — COMPREHENSIVE METABOLIC PANEL
ALT: 9 U/L (ref 6–29)
AST: 15 U/L (ref 10–35)
Albumin: 3.6 g/dL (ref 3.6–5.1)
Alkaline Phosphatase: 70 U/L (ref 33–130)
BILIRUBIN TOTAL: 0.4 mg/dL (ref 0.2–1.2)
BUN: 13 mg/dL (ref 7–25)
CALCIUM: 9.3 mg/dL (ref 8.6–10.4)
CO2: 26 mmol/L (ref 20–31)
Chloride: 103 mmol/L (ref 98–110)
Creat: 0.68 mg/dL (ref 0.50–0.99)
GLUCOSE: 93 mg/dL (ref 65–99)
Potassium: 3.7 mmol/L (ref 3.5–5.3)
Sodium: 138 mmol/L (ref 135–146)
Total Protein: 7 g/dL (ref 6.1–8.1)

## 2016-01-16 LAB — VITAMIN B12: Vitamin B-12: 279 pg/mL (ref 200–1100)

## 2016-01-16 LAB — FOLATE: FOLATE: 7.7 ng/mL (ref >5.4)

## 2016-01-16 NOTE — Patient Instructions (Signed)
Hold off on eating cheese, ice cream and all dairy products for the next week and see if you notice a difference in reflux and chest pain.  Continue taking Omeprazole 20 mg 30 minutes before breakfast, you can add a second dose 30 minutes before supper also for the next 2-4 weeks and see if this helps.  DO NOT eat 3 hours before going to bed.  Follow up in 4 weeks to see if reflux has improved.   I am referring you to a neurologist for numbness and tingling to right hand and arm and the ringing in your right ear.   Food Choices for Gastroesophageal Reflux Disease, Adult When you have gastroesophageal reflux disease (GERD), the foods you eat and your eating habits are very important. Choosing the right foods can help ease the discomfort of GERD. WHAT GENERAL GUIDELINES DO I NEED TO FOLLOW?  Choose fruits, vegetables, whole grains, low-fat dairy products, and low-fat meat, fish, and poultry.  Limit fats such as oils, salad dressings, butter, nuts, and avocado.  Keep a food diary to identify foods that cause symptoms.  Avoid foods that cause reflux. These may be different for different people.  Eat frequent small meals instead of three large meals each day.  Eat your meals slowly, in a relaxed setting.  Limit fried foods.  Cook foods using methods other than frying.  Avoid drinking alcohol.  Avoid drinking large amounts of liquids with your meals.  Avoid bending over or lying down until 2-3 hours after eating. WHAT FOODS ARE NOT RECOMMENDED? The following are some foods and drinks that may worsen your symptoms: Vegetables Tomatoes. Tomato juice. Tomato and spaghetti sauce. Chili peppers. Onion and garlic. Horseradish. Fruits Oranges, grapefruit, and lemon (fruit and juice). Meats High-fat meats, fish, and poultry. This includes hot dogs, ribs, ham, sausage, salami, and bacon. Dairy Whole milk and chocolate milk. Sour cream. Cream. Butter. Ice cream. Cream cheese.   Beverages Coffee and tea, with or without caffeine. Carbonated beverages or energy drinks. Condiments Hot sauce. Barbecue sauce.  Sweets/Desserts Chocolate and cocoa. Donuts. Peppermint and spearmint. Fats and Oils High-fat foods, including [place] fries and potato chips. Other Vinegar. Strong spices, such as black pepper, white pepper, red pepper, cayenne, curry powder, cloves, ginger, and chili powder. The items listed above may not be a complete list of foods and beverages to avoid. Contact your dietitian for more information.   This information is not intended to replace advice given to you by your health care provider. Make sure you discuss any questions you have with your health care provider.   Document Released: 08/27/2005 Document Revised: 09/17/2014 Document Reviewed: 07/01/2013 Elsevier Interactive Patient Education [employer].

## 2016-01-16 NOTE — Progress Notes (Signed)
Subjective:    Patient ID: Sheila Jacobs, female    DOB: Feb 21, 1951, 65 y.o.   MRN: [id number]  HPI Chief Complaint  Patient presents with  . chest pain    chest pains all the time all over her chest. wants to go to a neuro to see if a nerve is blocked somewhere   She is here with multiple complaints. She complains of diffuse intermittent chest pain that feels like needles sticking her, some times she has throbbing under her left breast, and occasional burning to central chest that is worse with eating. Movement and exertion does not make it worse. States after eating ice cream, cheese and mild that she has worsening chest pain.  States she has chest pain "a lot" and it has not changed since her previous work up with cardiologist.  States reflux medication helped with pain but it make the pain go away totally. Had workup with GI and was told she had mild gastritis.  She denies chest pain today and states "I haven't eaten yet so I don't have pain". Also denies palpitations, shortness of breath, DOE, orthopnea.   States she sits and watches tv most of the day. Lives with her daughter. Does not exercise.  Diet is nondiscretionary.   Also complains of intermittent dry cough that is worse after eating and relieved when not eating. Cough is not worse with activity. She reports heavy history of cigarette use, 1 ppd for 47 years.    Additionally she complains of right arm and hand numbness and tingling and feeling like pins and needles "all over". States onset "weeks ago", is intermittent, lasting for a few seconds and has been occuring while in bed, sitting in chair watching tv or with some activities. Denies weakness. She also reports continued tinnitus and has been evaluated by ENT and audiologist for same.      Past Medical History  Diagnosis Date  . Hypertension   . Enlarged LA (left atrium)   . Hyperlipidemia   . Obesity   . Heavy cigarette smoker     Quit 2015  . Morbid obesity  ([place])   . GERD (gastroesophageal reflux disease)   . Vertigo     on occasion  . Colon polyps     Review of Systems Pertinent positives and negatives in the history of present illness.     Objective:   Physical Exam  Constitutional: She is oriented to person, place, and time. She appears well-developed and well-nourished. No distress.  HENT:  Right Ear: Decreased hearing is noted.  Left Ear: Decreased hearing is noted.  Mouth/Throat: Uvula is midline, oropharynx is clear and moist and mucous membranes are normal.  Chronic hearing loss bilaterally, hearing to right ear.   Eyes: EOM are normal. Pupils are equal, round, and reactive to light.  Neck: Full passive range of motion without pain. Neck supple. Thyromegaly present.  Chronic thyromegaly  Cardiovascular: Normal rate, regular rhythm, normal heart sounds and intact distal pulses.  Exam reveals no gallop and no friction rub.   No murmur heard. LE edema bilateral nonpitting, chronic.   Pulmonary/Chest: Effort normal and breath sounds normal. She has no wheezes. She has no rhonchi. She exhibits no tenderness.  Abdominal: Soft. Normal appearance and bowel sounds are normal.  Musculoskeletal:       Right elbow: Normal.      Right wrist: Normal.       Right hand: Normal. Normal sensation noted. Normal strength noted.  Neurological: She  is alert and oriented to person, place, and time. She has normal strength and normal reflexes. She displays no tremor. No cranial nerve deficit or sensory deficit. She displays a negative Romberg sign. Coordination and gait normal.  Skin: Skin is warm and dry. No rash noted. No pallor.  Psychiatric: She has a normal mood and affect. Her speech is normal and behavior is normal. Thought content normal.   BP 126/60 mmHg  Pulse 64  Ht 5' 4.25" (1.632 m)  Wt 289 lb 9.6 oz (131.362 kg)  BMI 49.32 kg/m2        Assessment & Plan:  Intermittent chest pain - Plan: DG Chest 2 View, CBC with  Differential/Platelet  Numbness and tingling in right hand - Plan: CBC with Differential/Platelet, Comprehensive metabolic panel, TSH, Folate, Vitamin B12, Ambulatory referral to Neurology  Tinnitus of right ear - Plan: Ambulatory referral to Neurology  Cough - Plan: DG Chest 2 View, CANCELED: Spirometry with graph  Gastroesophageal reflux disease with esophagitis  Former heavy cigarette smoker (20-39 per day) - Plan: DG Chest 2 View, CANCELED: Spirometry with graph  Paresthesia of skin - Plan: TSH, Folate, Vitamin B12, Ambulatory referral to Neurology  Hypokalemia - Plan: Comprehensive metabolic panel  Discussed that her chest pain is unchanged from her previous work up with cardiology and appears to be related to GERD. Symptoms are worse with eating and relieved with PPI speaks to this.  Recommend continued strict GERD management and try avoiding dairy for next week or two and see if she notices improvement with symptoms. Continue on daily PPI.  Discussed that cough appears to be related to GERD but due to history of heavy smoking will order chest XR to rule out underlying etiology.  Plan to send to neurology for evaluation of paresthesias and right hand numbness and tingling.  Checking potassium level today and may need to adjust supplement.  Follow up pending labs and in 1 month for GERD.

## 2016-01-17 LAB — CBC WITH DIFFERENTIAL/PLATELET
BASOS ABS: 40 {cells}/uL (ref 0–200)
Basophils Relative: 1 %
EOS ABS: 120 {cells}/uL (ref 15–500)
EOS PCT: 3 %
HCT: 36.2 % (ref 35.0–45.0)
Hemoglobin: 12 g/dL (ref 11.7–15.5)
Lymphocytes Relative: 45 %
Lymphs Abs: 1800 cells/uL (ref 850–3900)
MCH: 31.1 pg (ref 27.0–33.0)
MCHC: 33.1 g/dL (ref 32.0–36.0)
MCV: 93.8 fL (ref 80.0–100.0)
MONOS PCT: 9 %
MPV: 10.3 fL (ref 7.5–12.5)
Monocytes Absolute: 360 cells/uL (ref 200–950)
NEUTROS PCT: 42 %
Neutro Abs: 1680 cells/uL (ref 1500–7800)
Platelets: 217 10*3/uL (ref 140–400)
RBC: 3.86 MIL/uL (ref 3.80–5.10)
RDW: 13.4 % (ref 11.0–15.0)
WBC: 4 10*3/uL (ref 4.0–10.5)

## 2016-01-17 LAB — T3: T3, Total: 105 ng/dL (ref 76–181)

## 2016-01-17 LAB — T4, FREE: Free T4: 1.2 ng/dL (ref 0.8–1.8)

## 2016-01-18 ENCOUNTER — Ambulatory Visit
Admission: RE | Admit: 2016-01-18 | Discharge: 2016-01-18 | Disposition: A | Discharge status: Home / Self Care | Payer: Medicare Other | Source: Ambulatory Visit | Attending: Family Medicine | Admitting: Family Medicine

## 2016-01-18 ENCOUNTER — Telehealth: Payer: Self-pay | Admitting: Endocrinology

## 2016-01-18 DIAGNOSIS — Z87891 Personal history of nicotine dependence: Secondary | ICD-10-CM

## 2016-01-18 DIAGNOSIS — R079 Chest pain, unspecified: Secondary | ICD-10-CM

## 2016-01-18 DIAGNOSIS — R059 Cough, unspecified: Secondary | ICD-10-CM

## 2016-01-18 DIAGNOSIS — R05 Cough: Secondary | ICD-10-CM

## 2016-01-18 DIAGNOSIS — J811 Chronic pulmonary edema: Secondary | ICD-10-CM | POA: Diagnosis not present

## 2016-01-18 IMAGING — CR DG CHEST 2V
2 series · 2 of 2 positions shown · non-contrast
Comparison: [DATE]

CLINICAL DATA: Intermittent chest pain

EXAM:
CHEST  2 VIEW

[w chest pa]
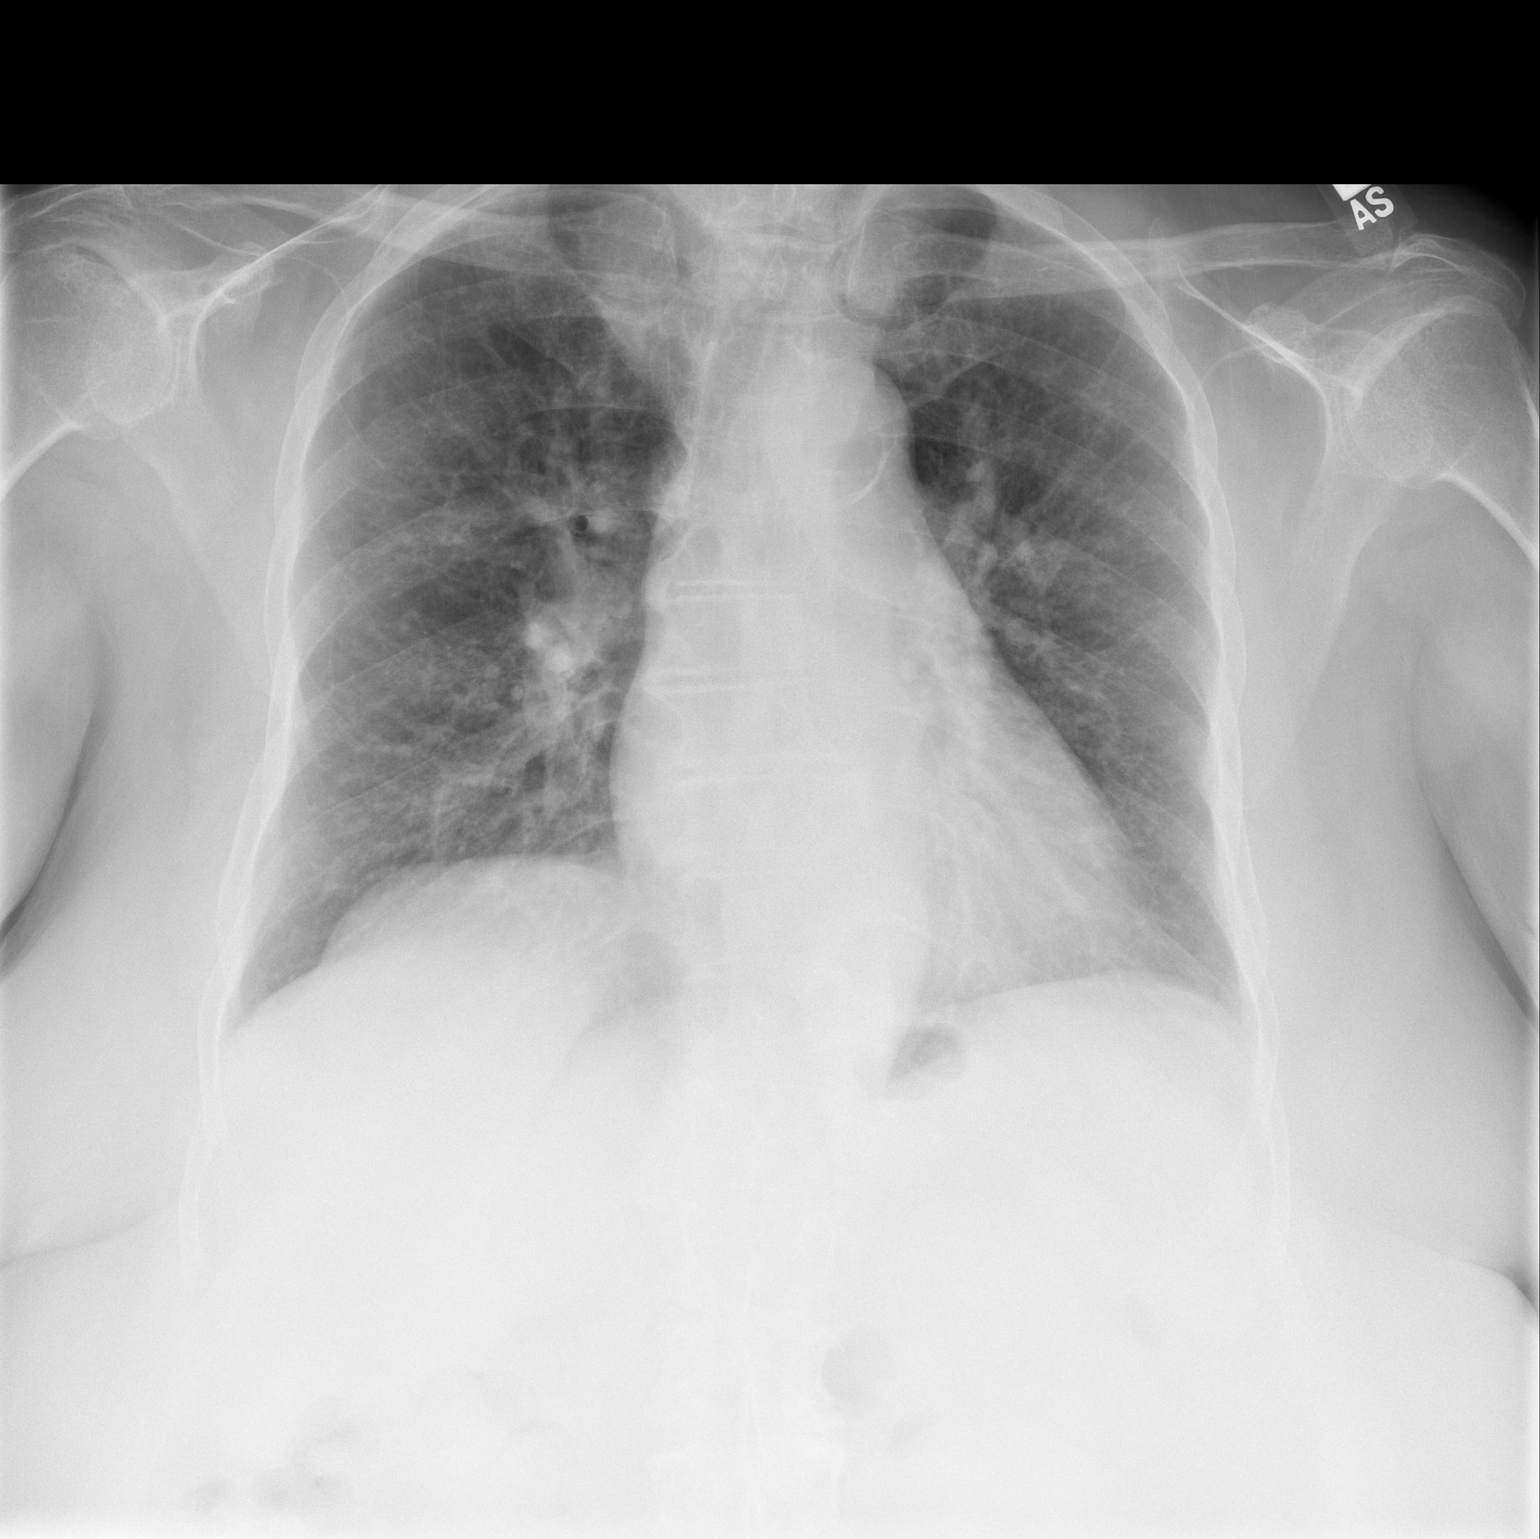

[w chest lat *]
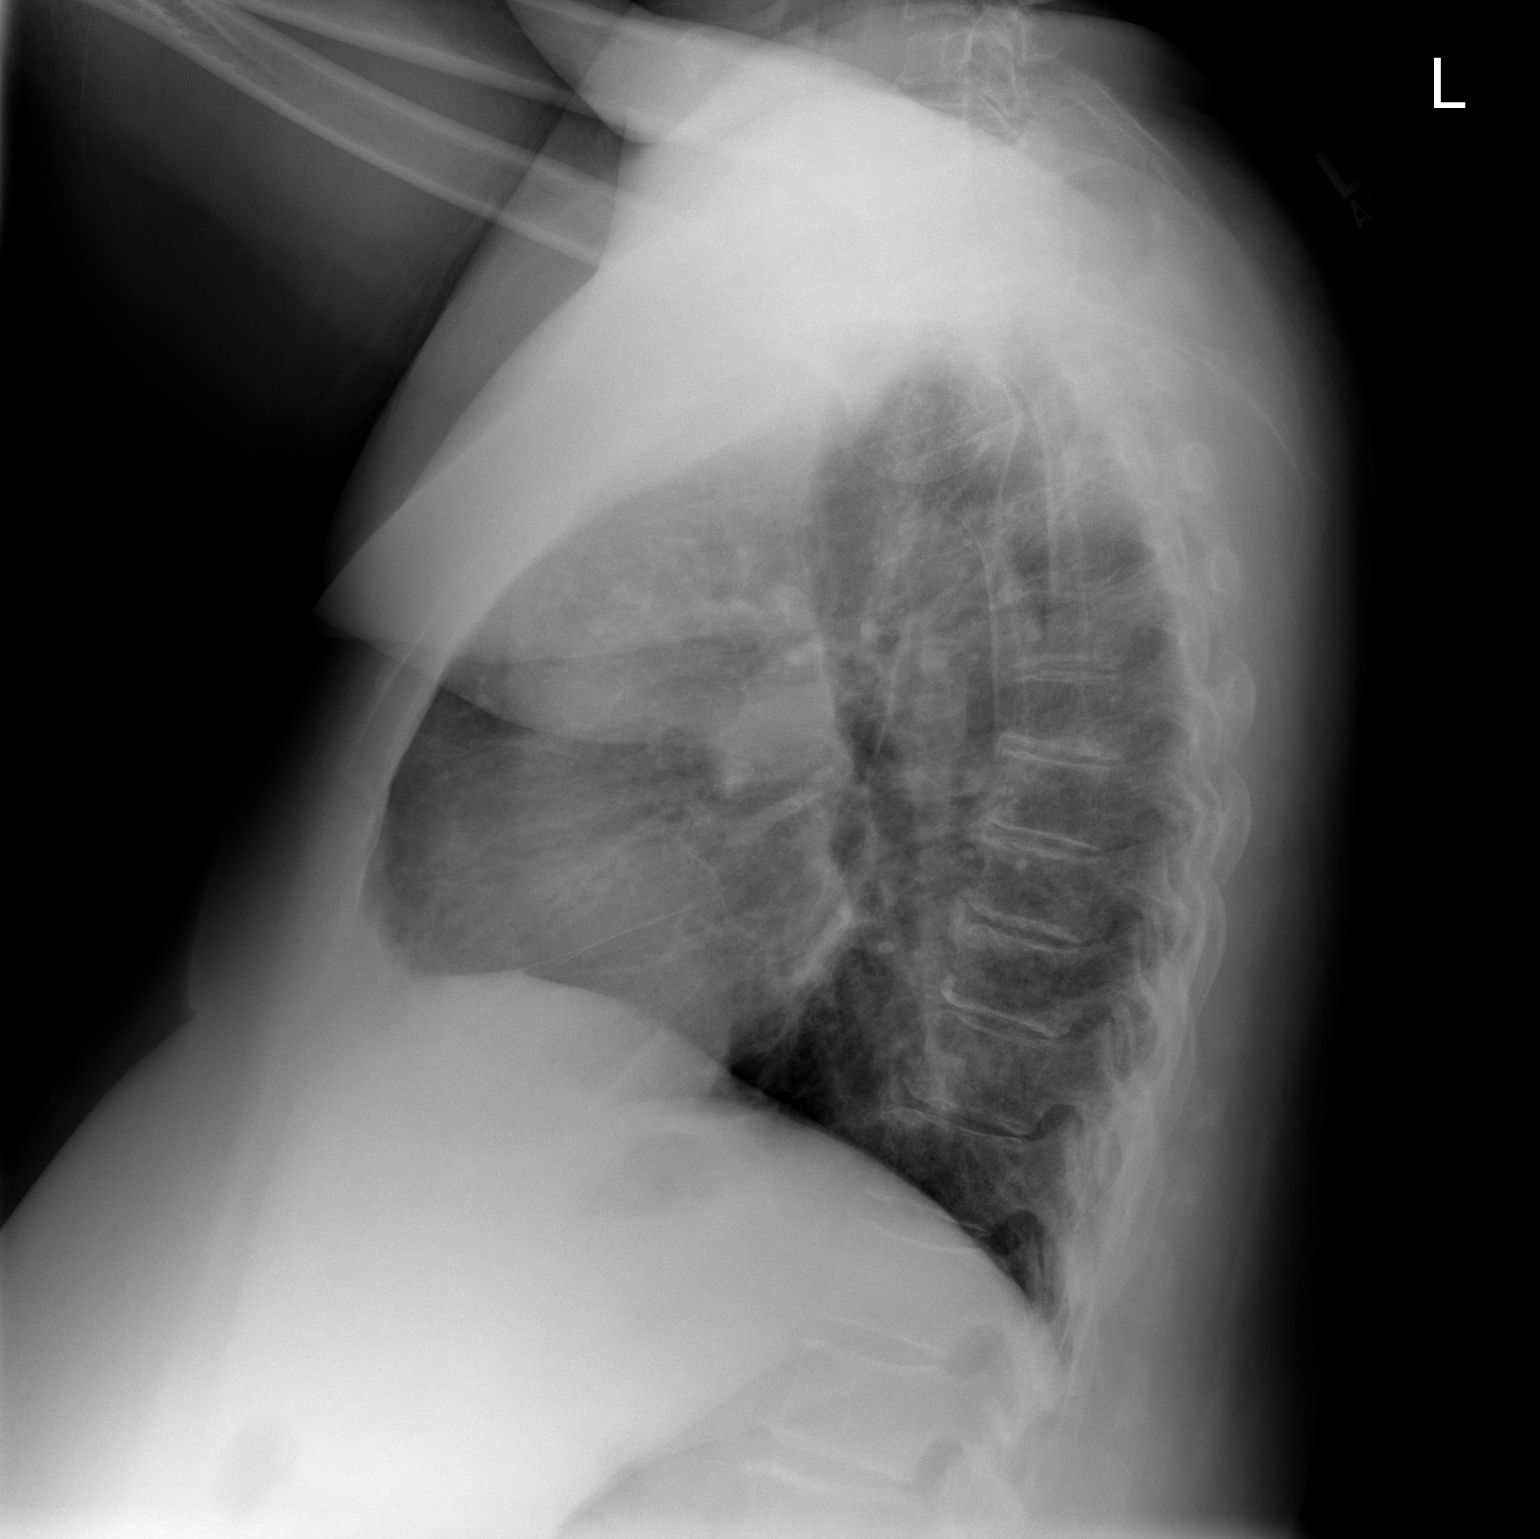

[2 of 2 positions shown; findings below may reference images not displayed]

FINDINGS: Stable cardiomegaly. Stable vascular congestion. No mass or
consolidation. Low volumes. No pneumothorax or pleural effusion.
IMPRESSION: Stable cardiomegaly and vascular congestion.

## 2016-01-18 NOTE — Telephone Encounter (Signed)
[facility] called and wanted to make Dr. Dwyane Dee aware that Pt's TSH came back abnormally low.  She said they are in EPIC so you can look at Pt's lab results.

## 2016-01-18 NOTE — Telephone Encounter (Signed)
Please see below.

## 2016-01-20 NOTE — Telephone Encounter (Signed)
TSH slightly low but T4 and T3 quite normal.  Likely has a slightly autonomous goiter, no change in management needed.  Follow-up every 6 months, thanks

## 2016-01-23 ENCOUNTER — Encounter: Payer: Self-pay | Admitting: Internal Medicine

## 2016-01-24 ENCOUNTER — Ambulatory Visit
Admission: RE | Admit: 2016-01-24 | Discharge: 2016-01-24 | Disposition: A | Discharge status: Home / Self Care | Payer: Medicare Other | Source: Ambulatory Visit | Attending: Family Medicine | Admitting: Family Medicine

## 2016-01-24 ENCOUNTER — Other Ambulatory Visit: Payer: Self-pay | Admitting: Family Medicine

## 2016-01-24 DIAGNOSIS — N6320 Unspecified lump in the left breast, unspecified quadrant: Secondary | ICD-10-CM

## 2016-01-24 DIAGNOSIS — N63 Unspecified lump in breast: Secondary | ICD-10-CM | POA: Diagnosis not present

## 2016-01-24 DIAGNOSIS — R921 Mammographic calcification found on diagnostic imaging of breast: Secondary | ICD-10-CM | POA: Diagnosis not present

## 2016-01-27 ENCOUNTER — Ambulatory Visit
Admission: RE | Admit: 2016-01-27 | Discharge: 2016-01-27 | Disposition: A | Discharge status: Home / Self Care | Payer: Medicare Other | Source: Ambulatory Visit | Attending: Family Medicine | Admitting: Family Medicine

## 2016-01-27 ENCOUNTER — Other Ambulatory Visit: Payer: Self-pay | Admitting: Family Medicine

## 2016-01-27 DIAGNOSIS — N63 Unspecified lump in breast: Secondary | ICD-10-CM | POA: Diagnosis not present

## 2016-01-27 DIAGNOSIS — N6320 Unspecified lump in the left breast, unspecified quadrant: Secondary | ICD-10-CM

## 2016-01-27 DIAGNOSIS — C50412 Malignant neoplasm of upper-outer quadrant of left female breast: Secondary | ICD-10-CM | POA: Diagnosis not present

## 2016-01-27 LAB — HM MAMMOGRAPHY

## 2016-01-31 ENCOUNTER — Ambulatory Visit (INDEPENDENT_AMBULATORY_CARE_PROVIDER_SITE_OTHER): Payer: Medicare Other | Admitting: Neurology

## 2016-01-31 ENCOUNTER — Telehealth: Payer: Self-pay | Admitting: *Deleted

## 2016-01-31 ENCOUNTER — Encounter: Payer: Self-pay | Admitting: *Deleted

## 2016-01-31 ENCOUNTER — Encounter: Payer: Self-pay | Admitting: Neurology

## 2016-01-31 VITALS — BP 129/75 | HR 80 | Ht 64.25 in | Wt 293.0 lb

## 2016-01-31 DIAGNOSIS — R269 Unspecified abnormalities of gait and mobility: Secondary | ICD-10-CM

## 2016-01-31 DIAGNOSIS — E785 Hyperlipidemia, unspecified: Secondary | ICD-10-CM

## 2016-01-31 DIAGNOSIS — M5442 Lumbago with sciatica, left side: Secondary | ICD-10-CM | POA: Diagnosis not present

## 2016-01-31 DIAGNOSIS — R202 Paresthesia of skin: Secondary | ICD-10-CM

## 2016-01-31 DIAGNOSIS — M545 Low back pain, unspecified: Secondary | ICD-10-CM | POA: Insufficient documentation

## 2016-01-31 DIAGNOSIS — C50412 Malignant neoplasm of upper-outer quadrant of left female breast: Secondary | ICD-10-CM

## 2016-01-31 HISTORY — DX: Malignant neoplasm of upper-outer quadrant of left female breast: C50.412

## 2016-01-31 NOTE — Telephone Encounter (Signed)
Confirmed [facility] for 02/08/16 at 0815 .  Instructions and contact information given.

## 2016-01-31 NOTE — Progress Notes (Signed)
PATIENT: Sheila Jacobs DOB: 1951-07-29  Chief Complaint  Patient presents with  . Numbness    She has been having problems with right arm/hand numbness and tingling for years. Occasionally, she will get these same symptoms in her left arm/hand and also down her right leg.     HISTORICAL  Sheila Jacobs is a 65 years old right-handed female, seen in refer by her primary care physician NP Girtha Rm for evaluation of right side numbness in Jan 31 2016.  She has history of hypertension, obesity, anxiety She noticed right side numbness since 2016, she could not elaborate on the onset of her symptoms, she noticed intermittent numbness involving her right arm, right leg, sparing her face, numbness stay with her for few minutes, she also felt subjective weakness, she drinks onion soup to make her symptoms go away, she has mild gait difficulty due to opaque body habitus, joints pain,  She has chronic low back pain, radiating pain to left leg, she denies bowel and bladder incontinence  REVIEW OF SYSTEMS: Full 14 system review of systems performed and notable only for Headache, numbness, dizziness, restless leg, palpitation, hearing loss, ringing ear, cough ALLERGIES: Allergies  Allergen Reactions  . Cortisone Rash    HOME MEDICATIONS: Current Outpatient Prescriptions  Medication Sig Dispense Refill  . acetaminophen (TYLENOL) 500 MG tablet Take 500 mg by mouth every 6 (six) hours as needed (Pain).    Marland Kitchen ALPRAZolam (XANAX) 0.25 MG tablet Take 1 tablet (0.25 mg total) by mouth 2 (two) times daily as needed for anxiety. 60 tablet 0  . aspirin 81 MG tablet Take 81 mg by mouth daily.    . calcium citrate-vitamin D (CITRACAL+D) 315-200 MG-UNIT per tablet Take 1 tablet by mouth daily.    . hydrochlorothiazide (HYDRODIURIL) 25 MG tablet Take 0.5 tablets (12.5 mg total) by mouth daily. 30 tablet 2  . ketoconazole (NIZORAL) 2 % cream APPLY ONE APPLICATION TOPICALLY DAILY (Patient taking  differently: APPLY ONE APPLICATION TOPICALLY DAILY AS NEEDED FOR TOE IRRITATION) 15 g 11  . omeprazole (PRILOSEC) 20 MG capsule Take 1 capsule (20 mg total) by mouth daily. 30 capsule 3  . potassium chloride SA (K-DUR,KLOR-CON) 20 MEQ tablet Take 1 tablet (20 mEq total) by mouth daily. (Patient taking differently: Take 20 mEq by mouth every other day. ) 30 tablet 3   No current facility-administered medications for this visit.    PAST MEDICAL HISTORY: Past Medical History  Diagnosis Date  . Hypertension   . Enlarged LA (left atrium)   . Hyperlipidemia   . Obesity   . Heavy cigarette smoker     Quit 2015  . Morbid obesity ([place])   . GERD (gastroesophageal reflux disease)   . Vertigo     on occasion  . Colon polyps   . Numbness and tingling   . Hard of hearing     Wears hearing aids.    PAST SURGICAL HISTORY: Past Surgical History  Procedure Laterality Date  . Tubal ligation    . Left heart catheterization with coronary angiogram N/A 10/21/2014    Procedure: LEFT HEART CATHETERIZATION WITH CORONARY ANGIOGRAM;  Surgeon: Burnell Blanks, MD;  Location: [facility] C[facility] LAB;  Service: Cardiovascular;  Laterality: N/A;  . Colonoscopy with propofol N/A 10/20/2015    Procedure: COLONOSCOPY WITH PROPOFOL;  Surgeon: Mauri Pole, MD;  Location: [facility] ENDOSCOPY;  Service: Endoscopy;  Laterality: N/A;  . Esophagogastroduodenoscopy (egd) with propofol N/A 10/20/2015    Procedure:  ESOPHAGOGASTRODUODENOSCOPY (EGD) WITH PROPOFOL;  Surgeon: Mauri Pole, MD;  Location: [facility] ENDOSCOPY;  Service: Endoscopy;  Laterality: N/A;    FAMILY HISTORY: Family History  Problem Relation Age of Onset  . Hypertension Mother   . Heart disease Mother     Pacemaker  . Kidney disease Mother   . Cataracts Mother   . Hearing loss Mother   . Diabetes Mother   . Diabetes Sister   . Cataracts Brother   . Hearing loss Brother   . Colon cancer Neg Hx   . Thyroid disease Neg Hx   . Liver disease Mother      SOCIAL HISTORY:  Social History   Social History  . Marital Status: Single    Spouse Name: N/A  . Number of Children: 1  . Years of Education: 12   Occupational History  . Retired    Social History Main Topics  . Smoking status: Former Smoker -- 1.00 packs/day for 45 years    Types: Cigarettes    Quit date: 07/11/2014  . Smokeless tobacco: Former Systems developer  . Alcohol Use: No  . Drug Use: No  . Sexual Activity: Not on file   Other Topics Concern  . Not on file   Social History Narrative   Lives with daughter.    Right-hand.   Occasional use of caffeine.      PHYSICAL EXAM   Filed Vitals:   01/31/16 0905  BP: 129/75  Pulse: 80  Height: 5' 4.25" (1.632 m)  Weight: 293 lb (132.904 kg)    Not recorded      Body mass index is 49.9 kg/(m^2).  PHYSICAL EXAMNIATION:  Gen: NAD, conversant, well nourised, obese, well groomed                     Cardiovascular: Regular rate rhythm, no peripheral edema, warm, nontender. Eyes: Conjunctivae clear without exudates or hemorrhage Neck: Supple, no carotid bruise. Pulmonary: Clear to auscultation bilaterally   NEUROLOGICAL EXAM:  MENTAL STATUS: Speech:    Speech is normal; fluent and spontaneous with normal comprehension.  Cognition:     Orientation to time, place and person     Normal recent and remote memory     Normal Attention span and concentration     Normal Language, naming, repeating,spontaneous speech     Fund of knowledge   CRANIAL NERVES: CN II: Visual fields are full to confrontation. Fundoscopic exam is normal with sharp discs and no vascular changes. Pupils are round equal and briskly reactive to light. CN III, IV, VI: extraocular movement are normal. No ptosis. CN V: Facial sensation is intact to pinprick in all 3 divisions bilaterally. Corneal responses are intact.  CN VII: Face is symmetric with normal eye closure and smile. CN VIII: Hearing is normal to rubbing fingers CN IX, X: Palate elevates  symmetrically. Phonation is normal. CN XI: Head turning and shoulder shrug are intact CN XII: Tongue is midline with normal movements and no atrophy.  MOTOR: There is no pronator drift of out-stretched arms. Muscle bulk and tone are normal. Muscle strength is normal.  REFLEXES: Reflexes are 2+ and symmetric at the biceps, triceps, knees, and ankles. Plantar responses are flexor.  SENSORY: Intact to light touch, pinprick, positional sensation and vibratory sensation are intact in fingers and toes.  COORDINATION: Rapid alternating movements and fine finger movements are intact. There is no dysmetria on finger-to-nose and heel-knee-shin.    GAIT/STANCE: Need pushing up to get up from  seated position, wide based, mildly unsteady, is also limited by her big body habitus  DIAGNOSTIC DATA (LABS, IMAGING, TESTING) - I reviewed patient records, labs, notes, testing and imaging myself where available.   ASSESSMENT AND PLAN  Sheila Jacobs is a 65 y.o. female    Right sided paresthesia  Potential localization to left hemisphere  Proceed with MRI of the brain  Keep daily aspirin  Gait abnormality  Multifactorial, obesity, joints pain, left leg pain radiating pain to left lower extremity suggestive of left lumbar radiculopathy  Proceed with EMG nerve conduction study  Marcial Pacas, M.D. Ph.D.  [facility] [address], [place], [place] [address] Ph: [phone] Fax: [phone]  CC: Girtha Rm, NP

## 2016-02-06 ENCOUNTER — Other Ambulatory Visit: Payer: Self-pay | Admitting: Family Medicine

## 2016-02-08 ENCOUNTER — Encounter: Payer: Self-pay | Admitting: Hematology and Oncology

## 2016-02-08 ENCOUNTER — Other Ambulatory Visit (HOSPITAL_BASED_OUTPATIENT_CLINIC_OR_DEPARTMENT_OTHER): Payer: Medicare Other

## 2016-02-08 ENCOUNTER — Encounter: Payer: Self-pay | Admitting: Nurse Practitioner

## 2016-02-08 ENCOUNTER — Encounter: Payer: Self-pay | Admitting: *Deleted

## 2016-02-08 ENCOUNTER — Ambulatory Visit: Payer: Medicare Other | Attending: Radiation Oncology | Admitting: Radiation Oncology

## 2016-02-08 ENCOUNTER — Encounter: Payer: Self-pay | Admitting: Skilled Nursing Facility1

## 2016-02-08 ENCOUNTER — Ambulatory Visit: Payer: Medicare Other | Attending: General Surgery | Admitting: Physical Therapy

## 2016-02-08 ENCOUNTER — Ambulatory Visit
Admission: RE | Admit: 2016-02-08 | Discharge: 2016-02-08 | Disposition: A | Discharge status: Home / Self Care | Payer: Medicare Other | Source: Ambulatory Visit | Attending: Radiation Oncology | Admitting: Radiation Oncology

## 2016-02-08 ENCOUNTER — Encounter: Payer: Self-pay | Admitting: Physical Therapy

## 2016-02-08 ENCOUNTER — Other Ambulatory Visit: Payer: Self-pay | Admitting: General Surgery

## 2016-02-08 ENCOUNTER — Ambulatory Visit (HOSPITAL_BASED_OUTPATIENT_CLINIC_OR_DEPARTMENT_OTHER): Payer: Medicare Other | Admitting: Hematology and Oncology

## 2016-02-08 VITALS — BP 144/81 | HR 72 | Temp 98.5°F | Resp 19 | Ht 64.25 in | Wt 287.9 lb

## 2016-02-08 DIAGNOSIS — Z170 Estrogen receptor positive status [ER+]: Secondary | ICD-10-CM

## 2016-02-08 DIAGNOSIS — C50412 Malignant neoplasm of upper-outer quadrant of left female breast: Secondary | ICD-10-CM

## 2016-02-08 DIAGNOSIS — M25611 Stiffness of right shoulder, not elsewhere classified: Secondary | ICD-10-CM | POA: Diagnosis not present

## 2016-02-08 DIAGNOSIS — R293 Abnormal posture: Secondary | ICD-10-CM | POA: Diagnosis not present

## 2016-02-08 DIAGNOSIS — C50912 Malignant neoplasm of unspecified site of left female breast: Secondary | ICD-10-CM | POA: Diagnosis not present

## 2016-02-08 DIAGNOSIS — M25612 Stiffness of left shoulder, not elsewhere classified: Secondary | ICD-10-CM

## 2016-02-08 LAB — COMPREHENSIVE METABOLIC PANEL
ALK PHOS: 76 U/L (ref 40–150)
ALT: 11 U/L (ref 0–55)
ANION GAP: 10 meq/L (ref 3–11)
AST: 15 U/L (ref 5–34)
Albumin: 3.4 g/dL — ABNORMAL LOW (ref 3.5–5.0)
BILIRUBIN TOTAL: 0.38 mg/dL (ref 0.20–1.20)
BUN: 12.1 mg/dL (ref 7.0–26.0)
CALCIUM: 9.7 mg/dL (ref 8.4–10.4)
CO2: 25 mEq/L (ref 22–29)
CREATININE: 0.9 mg/dL (ref 0.6–1.1)
Chloride: 104 mEq/L (ref 98–109)
EGFR: 81 mL/min/{1.73_m2} — ABNORMAL LOW (ref >90)
Glucose: 122 mg/dl (ref 70–140)
Potassium: 3.8 mEq/L (ref 3.5–5.1)
Sodium: 139 mEq/L (ref 136–145)
TOTAL PROTEIN: 7.9 g/dL (ref 6.4–8.3)

## 2016-02-08 LAB — CBC WITH DIFFERENTIAL/PLATELET
BASO%: 0.2 % (ref 0.0–2.0)
Basophils Absolute: 0 10*3/uL (ref 0.0–0.1)
EOS%: 2.8 % (ref 0.0–7.0)
Eosinophils Absolute: 0.1 10*3/uL (ref 0.0–0.5)
HEMATOCRIT: 35.9 % (ref 34.8–46.6)
HGB: 12.1 g/dL (ref 11.6–15.9)
LYMPH#: 1.8 10*3/uL (ref 0.9–3.3)
LYMPH%: 39.1 % (ref 14.0–49.7)
MCH: 31.7 pg (ref 25.1–34.0)
MCHC: 33.7 g/dL (ref 31.5–36.0)
MCV: 94 fL (ref 79.5–101.0)
MONO#: 0.5 10*3/uL (ref 0.1–0.9)
MONO%: 10.2 % (ref 0.0–14.0)
NEUT#: 2.2 10*3/uL (ref 1.5–6.5)
NEUT%: 47.7 % (ref 38.4–76.8)
PLATELETS: 207 10*3/uL (ref 145–400)
RBC: 3.82 10*6/uL (ref 3.70–5.45)
RDW: 12.2 % (ref 11.2–14.5)
WBC: 4.6 10*3/uL (ref 3.9–10.3)

## 2016-02-08 NOTE — Progress Notes (Signed)
[facility] CONSULT NOTE  Patient Care Team: Girtha Rm, NP as PCP - General  CHIEF COMPLAINTS/PURPOSE OF CONSULTATION:  Newly diagnosed breast cancer  HISTORY OF PRESENTING ILLNESS:  Sheila Jacobs 65 y.o. female is here because of recent diagnosis of left breast cancer. She had a screening mammogram that revealed a left breast mass measuring 9 mm in size. Biopsy was performed that revealed a grade 1-2 invasive ductal carcinoma with DCIS that was ER/PR positive HER-2 negative with a Ki-67 of 10%. She was presented in the tumor board and she is here today at the [facility] clinic to discuss the treatment plan. She is here recalled by herself. She started to say that she does not think she has breast cancer. We had extensive discussion about the fact that she does have breast cancer and I provided her with a copy of the pathology report. It was very difficult explaining to her the rationale for surgery or radiation or antiestrogen therapy. I went through that in great detail with the patient.  I reviewed her records extensively and collaborated the history with the patient.  SUMMARY OF ONCOLOGIC HISTORY:   Breast cancer of upper-outer quadrant of left female breast ([place])   01/27/2016 Initial Diagnosis Screening mammogram: Left breast mass 9 mm, grade 1-2 IDC with DCIS, ER 100%, PR 90%, Ki-67 10%, HER-2 negative ratio 1.5, T1 BN 0 stage IA clinical stage   MEDICAL HISTORY:  Past Medical History  Diagnosis Date  . Hypertension   . Enlarged LA (left atrium)   . Hyperlipidemia   . Obesity   . Heavy cigarette smoker     Quit 2015  . Morbid obesity ([place])   . GERD (gastroesophageal reflux disease)   . Vertigo     on occasion  . Colon polyps   . Numbness and tingling   . Hard of hearing     Wears hearing aids.  . Breast cancer of upper-outer quadrant of left female breast ([place]) 01/31/2016    SURGICAL HISTORY: Past Surgical History  Procedure Laterality Date  . Tubal  ligation    . Left heart catheterization with coronary angiogram N/A 10/21/2014    Procedure: LEFT HEART CATHETERIZATION WITH CORONARY ANGIOGRAM;  Surgeon: Burnell Blanks, MD;  Location: [facility] CATH LAB;  Service: Cardiovascular;  Laterality: N/A;  . Colonoscopy with propofol N/A 10/20/2015    Procedure: COLONOSCOPY WITH PROPOFOL;  Surgeon: Mauri Pole, MD;  Location: [facility] ENDOSCOPY;  Service: Endoscopy;  Laterality: N/A;  . Esophagogastroduodenoscopy (egd) with propofol N/A 10/20/2015    Procedure: ESOPHAGOGASTRODUODENOSCOPY (EGD) WITH PROPOFOL;  Surgeon: Mauri Pole, MD;  Location: [facility] ENDOSCOPY;  Service: Endoscopy;  Laterality: N/A;    SOCIAL HISTORY: Social History   Social History  . Marital Status: Single    Spouse Name: N/A  . Number of Children: 1  . Years of Education: 12   Occupational History  . Retired    Social History Main Topics  . Smoking status: Former Smoker -- 1.00 packs/day for 45 years    Types: Cigarettes    Quit date: 07/11/2014  . Smokeless tobacco: Former Systems developer  . Alcohol Use: No  . Drug Use: No  . Sexual Activity: Not on file   Other Topics Concern  . Not on file   Social History Narrative   Lives with daughter.    Right-hand.   Occasional use of caffeine.     FAMILY HISTORY: Family History  Problem Relation Age of Onset  .  Hypertension Mother   . Heart disease Mother     Pacemaker  . Kidney disease Mother   . Cataracts Mother   . Hearing loss Mother   . Diabetes Mother   . Diabetes Sister   . Cataracts Brother   . Hearing loss Brother   . Colon cancer Neg Hx   . Thyroid disease Neg Hx   . Liver disease Mother     ALLERGIES:  is allergic to cortisone.  MEDICATIONS:  Current Outpatient Prescriptions  Medication Sig Dispense Refill  . ALPRAZolam (XANAX) 0.25 MG tablet Take 1 tablet (0.25 mg total) by mouth 2 (two) times daily as needed for anxiety. 60 tablet 0  . aspirin EC 81 MG tablet Take 81 mg by mouth daily.    .  calcium citrate-vitamin D (CITRACAL+D) 315-200 MG-UNIT per tablet Take 1 tablet by mouth daily.    . hydrochlorothiazide (HYDRODIURIL) 25 MG tablet TAKE ONE-HALF TABLET BY MOUTH ONCE DAILY 30 tablet 9  . omeprazole (PRILOSEC) 20 MG capsule Take 1 capsule (20 mg total) by mouth daily. 30 capsule 3  . potassium chloride SA (K-DUR,KLOR-CON) 20 MEQ tablet Take 1 tablet (20 mEq total) by mouth daily. (Patient taking differently: Take 20 mEq by mouth every other day. ) 30 tablet 3   No current facility-administered medications for this visit.    REVIEW OF SYSTEMS:   Constitutional: Denies fevers, chills or abnormal night sweats Eyes: Denies blurriness of vision, double vision or watery eyes Ears, nose, mouth, throat, and face: Denies mucositis or sore throat Respiratory: Denies cough, dyspnea or wheezes Cardiovascular: Denies palpitation, chest discomfort or lower extremity swelling Gastrointestinal:  Denies nausea, heartburn or change in bowel habits Skin: Denies abnormal skin rashes Lymphatics: Denies new lymphadenopathy or easy bruising Neurological:Denies numbness, tingling or new weaknesses Behavioral/Psych: Mood is stable, no new changes  Breast:  Denies any palpable lumps or discharge All other systems were reviewed with the patient and are negative.  PHYSICAL EXAMINATION: ECOG PERFORMANCE STATUS: 1 - Symptomatic but completely ambulatory  Filed Vitals:   02/08/16 0904  BP: 144/81  Pulse: 72  Temp: 98.5 F (36.9 C)  Resp: 19   Filed Weights   02/08/16 0904  Weight: 287 lb 14.4 oz (130.591 kg)    GENERAL:alert, no distress and comfortable SKIN: skin color, texture, turgor are normal, no rashes or significant lesions EYES: normal, conjunctiva are pink and non-injected, sclera clear OROPHARYNX:no exudate, no erythema and lips, buccal mucosa, and tongue normal  NECK: supple, thyroid normal size, non-tender, without nodularity LYMPH:  no palpable lymphadenopathy in the  cervical, axillary or inguinal LUNGS: clear to auscultation and percussion with normal breathing effort HEART: regular rate & rhythm and no murmurs and no lower extremity edema ABDOMEN:abdomen soft, non-tender and normal bowel sounds Musculoskeletal:no cyanosis of digits and no clubbing  PSYCH: alert & oriented x 3 with fluent speech NEURO: no focal motor/sensory deficits BREAST: No palpable nodules in breast. No palpable axillary or supraclavicular lymphadenopathy (exam performed in the presence of a chaperone) Patient has extremely large pendulous breasts.  LABORATORY DATA:  I have reviewed the data as listed Lab Results  Component Value Date   WBC 4.6 02/08/2016   HGB 12.1 02/08/2016   HCT 35.9 02/08/2016   MCV 94.0 02/08/2016   PLT 207 02/08/2016   Lab Results  Component Value Date   NA 139 02/08/2016   K 3.8 02/08/2016   CL 103 01/16/2016   CO2 25 02/08/2016    RADIOGRAPHIC STUDIES:  I have personally reviewed the radiological reports and agreed with the findings in the report.  ASSESSMENT AND PLAN:  Breast cancer of upper-outer quadrant of left female breast ([place]) 01/27/2016: Screening mammogram: Left breast mass 9 mm, grade 1-2 IDC with DCIS, ER 100%, PR 90%, Ki-67 10%, HER-2 negative ratio 1.5, T1 BN 0 stage IA clinical stage  Pathology and radiology counseling:Discussed with the patient, the details of pathology including the type of breast cancer,the clinical staging, the significance of ER, PR and HER-2/neu receptors and the implications for treatment. After reviewing the pathology in detail, we proceeded to discuss the different treatment options between surgery, radiation, chemotherapy, antiestrogen therapies.  Recommendations: 1. Breast conserving surgery followed by 2. Adjuvant radiation therapy (patient might be a partial breast irradiation candidate) 3. Adjuvant antiestrogen therapy With anastrozole 5 years  It was with great difficulty that we explained to  the patient that she does have breast cancer and that she does need surgery radiation and antiestrogen treatments to give her the best possibility for a cure.  All questions were answered. The patient knows to call the clinic with any problems, questions or concerns.    Rulon Eisenmenger, MD 02/08/2016

## 2016-02-08 NOTE — Progress Notes (Signed)
Radiation Oncology         [phone]) [phone] ________________________________  Initial Outpatient Consultation - Date: 02/08/2016   Name: Sheila Jacobs MRN: [id number]   DOB: 1950/09/13  REFERRING PHYSICIAN: Glenna Fellows, MD  DIAGNOSIS AND STAGE: No matching staging information was found for the patient.  Breast cancer of the upper-outer quadrant of the left female breast.  HISTORY OF PRESENT ILLNESS::Sheila Jacobs is a 65 y.o. female who presents today because of an abnormal routine mammogram. She had a diagnostic mammogram and ultrasound 07/21/2015 revealed a likely benign 0.5 x 0.2 x 0.5 cm hypoechoic mass in the slightly upper-outer left breast. She had a 6 month follow up scheduled to check the left breast. She had a mammogram and ultrasound revealing a suspicious mass at 12:30 to 1 o'clock position left breast. She had a biopsy 01/27/2016, showing invasive and in situ ductal carcinoma, grade 1-2, ER (100%), PR (90%), Her2-neu negative, Ki 67 (10%). She is here today to discuss radiation treatment options.  She has done well since her biopsy. She is unaccompanied today. She had questions about her breast density and she was not convinced she has cancer.   PREVIOUS RADIATION THERAPY: No  Past medical, social and family history were reviewed in the electronic chart. Review of symptoms was reviewed in the electronic chart. Medications were reviewed in the electronic chart.   PHYSICAL EXAM: There were no vitals filed for this visit.. .  No palpable cervical, supraclavicular, or axillary adenopathy. She has large, pendulous breasts bilaterally. No palpable abnormalities of bilateral breasts.  Vitals with BMI 02/08/2016  Height 5' 4.25"  Weight 287 lbs 14 oz  BMI 49.1  Systolic 144  Diastolic 81  Pulse 72  Respirations 19   Gynecologic History  Age at first menstrual period? 12-14  Are you still having periods? No  If you are still having periods: Are your periods regular?  No  If you no longer have periods: Have you used hormone replacement? No Obstetric History:  How many children have you carried to term? 1 Your age at first live birth? 20  Pregnant now or trying to get pregnant? No  Have you used birth control pills or hormone shots for contraception? No  Would you be interested in learning more about the options to preserve fertility? No Health Maintenance:  Have you ever had a colonoscopy? Yes  Have you ever had a bone density? No  Date of your last PAP smear? 2 months ago  IMPRESSION: [name] is a 65 yo woman with invasive and in situ ductal carcinoma in the left breast. She is a good candidate for a left lumpectomy and sentinel node biopsy, mammosite or external radiation, oncotype testing, and aromatase inhibitor.  PLAN: I spoke to the patient today regarding her diagnosis and options for treatment. We discussed 5 weeks of treatment as an outpatient. We discussed the low likelihood of secondary malignancies. We discussed the possible side effects including but not limited to skin redness, fatigue, permanent skin darkening, and breast swelling. We discussed the role of a mammosite and it's benefits because of her breast size compared to external beam radiotherapy.   She is still uncertain about radiation. I strongly recommended partial breast radiation in light of her breast size. I have given her information about Mammosite and encouraged her to consider.    I spent 40 minutes  face to face with the patient and more than 50% of that time was spent in counseling and/or coordination  of care.   ------------------------------------------------  Thea Silversmith, MD    This document serves as a record of services personally performed by Thea Silversmith, MD. It was created on her behalf by  Lendon Collar, a trained medical scribe. The creation of this record is based on the scribe's personal observations and the provider's statements to them. This  document has been checked and approved by the attending provider.

## 2016-02-08 NOTE — Therapy (Signed)
[place], [place], [address] Phone: [phone]   Fax:  [phone]  Physical Therapy Evaluation  Patient Details  Name: Sheila Jacobs MRN: [id number] Date of Birth: 05-Jun-1951 Referring Provider: Dr. Excell Seltzer  Encounter Date: 02/08/2016      PT End of Session - 02/08/16 1342    Visit Number 1   Number of Visits 1   PT Start Time 1140   PT Stop Time 1202   PT Time Calculation (min) 22 min   Activity Tolerance Patient tolerated treatment well   Behavior During Therapy [facility] for tasks assessed/performed      Past Medical History  Diagnosis Date  . Hypertension   . Enlarged LA (left atrium)   . Hyperlipidemia   . Obesity   . Heavy cigarette smoker     Quit 2015  . Morbid obesity ([place])   . GERD (gastroesophageal reflux disease)   . Vertigo     on occasion  . Colon polyps   . Numbness and tingling   . Hard of hearing     Wears hearing aids.  . Breast cancer of upper-outer quadrant of left female breast ([place]) 01/31/2016    Past Surgical History  Procedure Laterality Date  . Tubal ligation    . Left heart catheterization with coronary angiogram N/A 10/21/2014    Procedure: LEFT HEART CATHETERIZATION WITH CORONARY ANGIOGRAM;  Surgeon: Burnell Blanks, MD;  [facility] CATH LAB;  Service: Cardiovascular;  Laterality: N/A;  . Colonoscopy with propofol N/A 10/20/2015    Procedure: COLONOSCOPY WITH PROPOFOL;  Surgeon: Mauri Pole, MD;  Location: [facility] ENDOSCOPY;  Service: Endoscopy;  Laterality: N/A;  . Esophagogastroduodenoscopy (egd) with propofol N/A 10/20/2015    Procedure: ESOPHAGOGASTRODUODENOSCOPY (EGD) WITH PROPOFOL;  Surgeon: Mauri Pole, MD;  Location: [facility] ENDOSCOPY;  Service: Endoscopy;  Laterality: N/A;    There were no vitals filed for this visit.       Subjective Assessment - 02/08/16 1254    Subjective Patient reports she is here today to be seen by her  medical team for her newly diagnosed left breast cancer.   Pertinent History Patient was diagnosed on 01/24/16 with left grade 1-2 invasive ductal carcinoma breast cancer.  It is located in the left upper outer quadrant, is ER/PR positive, HER2 negative, and has a Ki67 of 10%.  It measures 9 mm.  Her multidisciplinary medical team met prior to her assessment to determine a recommended treatment plan.   Patient Stated Goals reduce lymphedema risk and learn post op shoulder ROM HEP   Currently in Pain? No/denies            [facility] PT Assessment - 02/08/16 0001    Assessment   Medical Diagnosis Left breast cancer   Referring Provider Dr. Excell Seltzer   Onset Date/Surgical Date 01/24/16   Hand Dominance Right   Prior Therapy none   Precautions   Precautions Other (comment)   Precaution Comments Active breast cancer   Restrictions   Weight Bearing Restrictions No   Balance Screen   Has the patient fallen in the past 6 months No   Has the patient had a decrease in activity level because of a fear of falling?  No   Is the patient reluctant to leave their home because of a fear of falling?  No   Home Environment   Living Environment Private residence   Living Arrangements Alone   Available Help at Discharge Family  Prior Function   Level of Independence Independent   Vocation Unemployed   Vocation Requirements N/A   Leisure She does not exercise   Cognition   Overall Cognitive Status Within Functional Limits for tasks assessed   Posture/Postural Control   Posture/Postural Control Postural limitations   Postural Limitations Rounded Shoulders;Forward head  Slumped sitting posture   ROM / Strength   AROM / PROM / Strength AROM;Strength   AROM   AROM Assessment Site Shoulder   Right/Left Shoulder Right;Left   Right Shoulder Extension 47 Degrees   Right Shoulder Flexion 120 Degrees   Right Shoulder ABduction 128 Degrees  Tight and limited   Right Shoulder Internal Rotation 59  Degrees   Right Shoulder External Rotation 60 Degrees   Left Shoulder Extension 55 Degrees   Left Shoulder Flexion 129 Degrees   Left Shoulder ABduction 117 Degrees  Tight and limited   Left Shoulder Internal Rotation 63 Degrees   Left Shoulder External Rotation 72 Degrees   Strength   Overall Strength Within functional limits for tasks performed           LYMPHEDEMA/ONCOLOGY QUESTIONNAIRE - 02/08/16 1340    Type   Cancer Type Left breast cancer   Lymphedema Assessments   Lymphedema Assessments Upper extremities   Right Upper Extremity Lymphedema   10 cm Proximal to Olecranon Process 45 cm   Olecranon Process 30.2 cm   10 cm Proximal to Ulnar Styloid Process 26.7 cm   Just Proximal to Ulnar Styloid Process 18.5 cm   Across Hand at [employer] 21 cm   At [place] of 2nd Digit 7.3 cm   Left Upper Extremity Lymphedema   10 cm Proximal to Olecranon Process 40.7 cm   Olecranon Process 30.5 cm   10 cm Proximal to Ulnar Styloid Process 25.6 cm   Just Proximal to Ulnar Styloid Process 18.6 cm   Across Hand at [employer] 20.9 cm   At [place] of 2nd Digit 7 cm            Patient was instructed today in a home exercise program today for post op shoulder range of motion. These included active assist shoulder flexion in sitting, scapular retraction, wall walking with shoulder abduction, and hands behind head external rotation.  She was encouraged to do these twice a day, holding 3 seconds and repeating 5 times when permitted by her physician.                PT Education - 02/08/16 1341    Education provided Yes   Education Details Lymphedema risk reduction and post op shoulder ROM HEP   Person(s) Educated Patient   Methods Explanation;Demonstration;Handout   Comprehension Returned demonstration;Verbalized understanding              Breast Clinic Goals - 02/08/16 1346    Patient will be able to verbalize understanding of pertinent lymphedema risk reduction  practices relevant to her diagnosis specifically related to skin care.   Time 1   Period Days   Status Achieved   Patient will be able to return demonstrate and/or verbalize understanding of the post-op home exercise program related to regaining shoulder range of motion.   Time 1   Period Days   Status Achieved   Patient will be able to verbalize understanding of the importance of attending the postoperative After Breast Cancer Class for further lymphedema risk reduction education and therapeutic exercise.   Time 1   Period Days  Status Achieved              Plan - 02/08/16 1342    Clinical Impression Statement Patient was diagnosed on 01/24/16 with left grade 1-2 invasive ductal carcinoma breast cancer.  It is located in the left upper outer quadrant, is ER/PR positive, HER2 negative, and has a Ki67 of 10%.  It measures 9 mm.  Her multidisciplinary medical team met prior to her assessment to determine a recommended treatment plan.  She is planning to have a left lumpectomy with a sentinel node biopsy, Mammosite radiation, and anti-estrogen therapy.  She may benefit from post op PT to regain shoulder ROM and reduce lymphedema risk.  Due to lack of comorbidities, her evaluation is low complexity.   Rehab Potential Excellent   Clinical Impairments Affecting Rehab Potential none   PT Frequency One time visit   PT Treatment/Interventions Therapeutic exercise;Patient/family education   PT Next Visit Plan Will f/u after surgery   PT Home Exercise Plan Post op shoulder ROM HEP   Consulted and Agree with Plan of Care Patient      Patient will benefit from skilled therapeutic intervention in order to improve the following deficits and impairments:  Decreased strength, Decreased knowledge of precautions, Pain, Impaired UE functional use, Decreased range of motion  Visit Diagnosis: Carcinoma of upper-outer quadrant of left female breast ([place]) - Plan: PT plan of care cert/re-cert  Abnormal  posture - Plan: PT plan of care cert/re-cert  Stiffness of right shoulder, not elsewhere classified - Plan: PT plan of care cert/re-cert  Stiffness of left shoulder, not elsewhere classified - Plan: PT plan of care cert/re-cert      G-Codes - 91/47/82 1347    Functional Assessment Tool Used Clinical Judgement   Functional Limitation Other PT primary   Other PT Primary Current Status (N5621) At least 1 percent but less than 20 percent impaired, limited or restricted   Other PT Primary Goal Status (H0865) At least 1 percent but less than 20 percent impaired, limited or restricted   Other PT Primary Discharge Status (H8469) At least 1 percent but less than 20 percent impaired, limited or restricted     Patient will follow up at outpatient cancer rehab if needed following surgery.  If the patient requires physical therapy at that time, a specific plan will be dictated and sent to the referring physician for approval. The patient was educated today on appropriate basic range of motion exercises to begin post operatively and the importance of attending the After Breast Cancer class following surgery.  Patient was educated today on lymphedema risk reduction practices as it pertains to recommendations that will benefit the patient immediately following surgery.  She verbalized good understanding.  No additional physical therapy is indicated at this time.     Problem List Patient Active Problem List   Diagnosis Date Noted  . Paresthesia 01/31/2016  . Abnormality of gait 01/31/2016  . Low back pain 01/31/2016  . Breast cancer of upper-outer quadrant of left female breast ([place]) 01/31/2016  . Former heavy cigarette smoker (20-39 per day) 11/08/2015  . Goiter 11/08/2015  . Esophageal reflux   . Special screening for malignant neoplasms, colon   . Hearing loss 10/07/2015  . GERD (gastroesophageal reflux disease) 05/03/2015  . Dependent edema 05/03/2015  . Morbid obesity ([place]) 04/29/2015  . Right  carotid bruit 10/06/2014  . Hypokalemia   . Hypomagnesemia   . Atypical chest pain 09/18/2014  . Hypertension 09/18/2014  . Hyperlipidemia 09/18/2014  .  Enlarged LA (left atrium)   . Chest pain, precordial 09/02/2014   Annia Friendly, [place] 02/08/2016 1:48 PM  [place], [place], [address] Phone: [phone]   Fax:  [phone]  Name: Sheila Jacobs MRN: [id number] Date of Birth: 07-Feb-1951

## 2016-02-08 NOTE — Progress Notes (Signed)
Sheila Jacobs is a very pleasant 65 y.o. 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  Biopsy results revealed the tumor's prognostic profile is ER positive, PR positive, and HER2/neu negative. Ki67 is 10%.  She presents today to the [facility]) for treatment consideration and recommendations from the breast surgeon, radiation oncologist, and medical oncologist.     I briefly met with Sheila Jacobs during her [facility] visit today. We discussed the purpose of the Survivorship Clinic, which will include monitoring for recurrence, coordinating completion of age and gender-appropriate cancer screenings, promotion of overall wellness, as well as managing potential late/long-term side effects of anti-cancer treatments.    The treatment plan for Sheila Jacobs will likely include surgery, radiation therapy (Mammosite), and anti-estrogen therapy.  As of today, the intent of treatment for Sheila Jacobs is cure, therefore she will be eligible for the Survivorship Clinic upon her completion of treatment.  Her survivorship care plan (SCP) document will be drafted and updated throughout the course of her treatment trajectory. She will receive the SCP in an office visit with myself in the Survivorship Clinic once she has completed treatment.   Sheila Jacobs was encouraged to ask questions and all questions were answered to her satisfaction.  She was given my business card and encouraged to contact me with any concerns regarding survivorship.  I look forward to participating in her care.   Kenn File, [place] [phone]

## 2016-02-08 NOTE — Progress Notes (Signed)
Subjective:     Patient ID: Sheila Jacobs, female   DOB: 1951/01/27, 65 y.o.   MRN: [id number]  HPI   Review of Systems     Objective:   Physical Exam For the patient to understand and be given the tools to implement a healthy plant based diet during their cancer diagnosis.     Assessment:     Patient was seen today and found to be pleasant. Pts ht 64 in, 287 pounds, BMI 49.1. Medical hx: HTN, GERD.  Pt was not ready for any dietary changes and cannot change her chips and soda at all. She may be willing to lift her legs while she watches television and may use the stairs at her daughters house.      Plan:     Dietitian educated the pt on As a part of a healthy routine physical activity was discussed. A folder of evidence based information with a focus on a plant based diet and general nutrition during cancer was given to the patient.  As a part of the continuum of care the cancer dietitian's contact information was given to the patient in the event they would like to have a follow up appointment.

## 2016-02-08 NOTE — Patient Instructions (Signed)

## 2016-02-08 NOTE — Assessment & Plan Note (Signed)
01/27/2016: Screening mammogram: Left breast mass 9 mm, grade 1-2 IDC with DCIS, ER 100%, PR 90%, Ki-67 10%, HER-2 negative ratio 1.5, T1 BN 0 stage IA clinical stage  Pathology and radiology counseling:Discussed with the patient, the details of pathology including the type of breast cancer,the clinical staging, the significance of ER, PR and HER-2/neu receptors and the implications for treatment. After reviewing the pathology in detail, we proceeded to discuss the different treatment options between surgery, radiation, chemotherapy, antiestrogen therapies.  Recommendations: 1. Breast conserving surgery followed by 2. Oncotype DX testing to determine if chemotherapy would be of any benefit followed by 3. Adjuvant radiation therapy (patient might be a partial breast irradiation candidate) 4. Adjuvant antiestrogen therapy  Oncotype counseling: I discussed Oncotype DX test. I explained to the patient that this is a 21 gene panel to evaluate patient tumors DNA to calculate recurrence score. This would help determine whether patient has high risk or intermediate risk or low risk breast cancer. She understands that if her tumor was found to be high risk, she would benefit from systemic chemotherapy. If low risk, no need of chemotherapy. If she was found to be intermediate risk, we would need to evaluate the score as well as other risk factors and determine if an abbreviated chemotherapy may be of benefit.  Return to clinic after surgery to discuss final pathology report and then determine if Oncotype DX testing will need to be sent.

## 2016-02-08 NOTE — Progress Notes (Signed)
Clinical Social Work [place] Psychosocial Distress Screening [place]  Patient completed distress screening protocol and scored a 1 on the Psychosocial Distress Thermometer which indicates mild distress. Clinical Social Worker met with patient in [facility] to assess for distress and other psychosocial needs. Patient stated she felt comfortable with her treatment plan and treatment team.  Patient expressed some concern for transportation.  Patient is familiar with the bus system and stated she has used Medicaid transportation in the past.  Patient stated she received a letter from social services stating she needed additional "papers" and was eligible for a "hearing" .  CSW encouraged patient to call contact on letter to determine what paper work is needed and to see if she is still eligible for Medicaid transportation.  CSW offered support and encouraged patient to call if she had questions about the "paper work".  CSW and patient discussed common feeling and emotions when being diagnosed with cancer, and the importance of support during treatment. CSW informed patient of the support team and support services at [facility]. CSW provided contact information and encouraged patient to call with any questions or concerns.   ONCBCN DISTRESS SCREENING 02/08/2016  Screening Type Initial Screening  Distress experienced in past week (1-10) 1  Emotional problem type Nervousness/Anxiety  Physical Problem type Tingling hands/feet  Physician notified of physical symptoms Yes  Referral to clinical psychology No  Referral to clinical social work Yes  Referral to dietition No  Referral to financial advocate Yes  Referral to support programs Yes  Referral to palliative care No   Johnnye Lana, MSW, LCSW, OSW-C Clinical Social Worker [place] [phone]

## 2016-02-09 DIAGNOSIS — M1711 Unilateral primary osteoarthritis, right knee: Secondary | ICD-10-CM | POA: Diagnosis not present

## 2016-02-14 ENCOUNTER — Ambulatory Visit
Admission: RE | Admit: 2016-02-14 | Discharge: 2016-02-14 | Disposition: A | Discharge status: Home / Self Care | Payer: Medicare Other | Source: Ambulatory Visit | Attending: Medical | Admitting: Medical

## 2016-02-14 ENCOUNTER — Ambulatory Visit (INDEPENDENT_AMBULATORY_CARE_PROVIDER_SITE_OTHER): Payer: Medicare Other | Admitting: Medical

## 2016-02-14 ENCOUNTER — Encounter: Payer: Self-pay | Admitting: *Deleted

## 2016-02-14 ENCOUNTER — Encounter: Payer: Self-pay | Admitting: Medical

## 2016-02-14 ENCOUNTER — Telehealth: Payer: Self-pay | Admitting: *Deleted

## 2016-02-14 VITALS — BP 130/80 | HR 80 | Wt 292.0 lb

## 2016-02-14 DIAGNOSIS — R05 Cough: Secondary | ICD-10-CM | POA: Diagnosis not present

## 2016-02-14 DIAGNOSIS — R079 Chest pain, unspecified: Secondary | ICD-10-CM | POA: Diagnosis not present

## 2016-02-14 DIAGNOSIS — M79604 Pain in right leg: Secondary | ICD-10-CM

## 2016-02-14 DIAGNOSIS — I10 Essential (primary) hypertension: Secondary | ICD-10-CM | POA: Diagnosis not present

## 2016-02-14 DIAGNOSIS — E876 Hypokalemia: Secondary | ICD-10-CM

## 2016-02-14 DIAGNOSIS — M179 Osteoarthritis of knee, unspecified: Secondary | ICD-10-CM | POA: Diagnosis not present

## 2016-02-14 DIAGNOSIS — K219 Gastro-esophageal reflux disease without esophagitis: Secondary | ICD-10-CM

## 2016-02-14 DIAGNOSIS — R609 Edema, unspecified: Secondary | ICD-10-CM

## 2016-02-14 DIAGNOSIS — R053 Chronic cough: Secondary | ICD-10-CM

## 2016-02-14 DIAGNOSIS — R600 Localized edema: Secondary | ICD-10-CM

## 2016-02-14 IMAGING — CR DG CHEST 2V
2 series · 2 of 2 positions shown · non-contrast
Comparison: Chest x-rays dated [DATE], [DATE] and
[DATE].

CLINICAL DATA: Medial chest pain for 1 year. Pain onset after
eating. Dry cough for 1 year.

History of hypertension and GERD.
EXAM:
CHEST  2 VIEW

[w chest pa *]
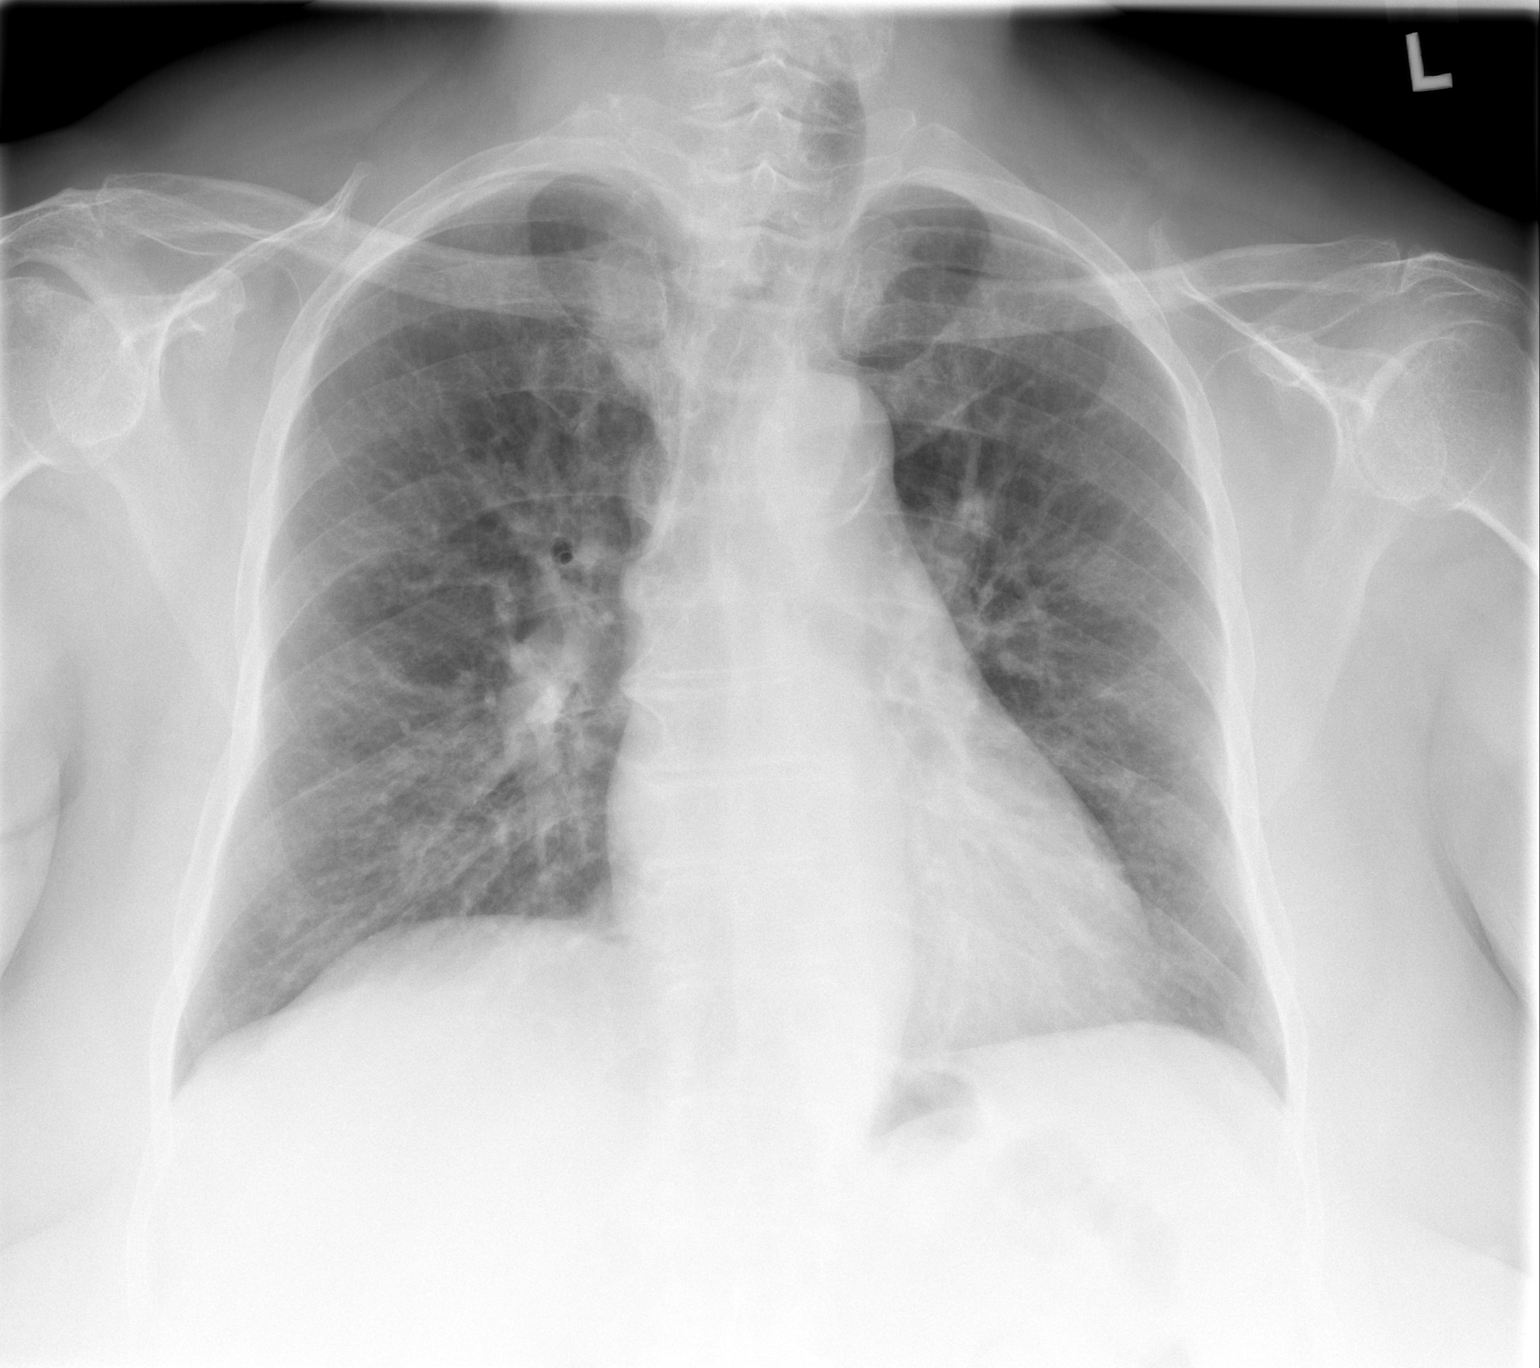

[w chest lat]
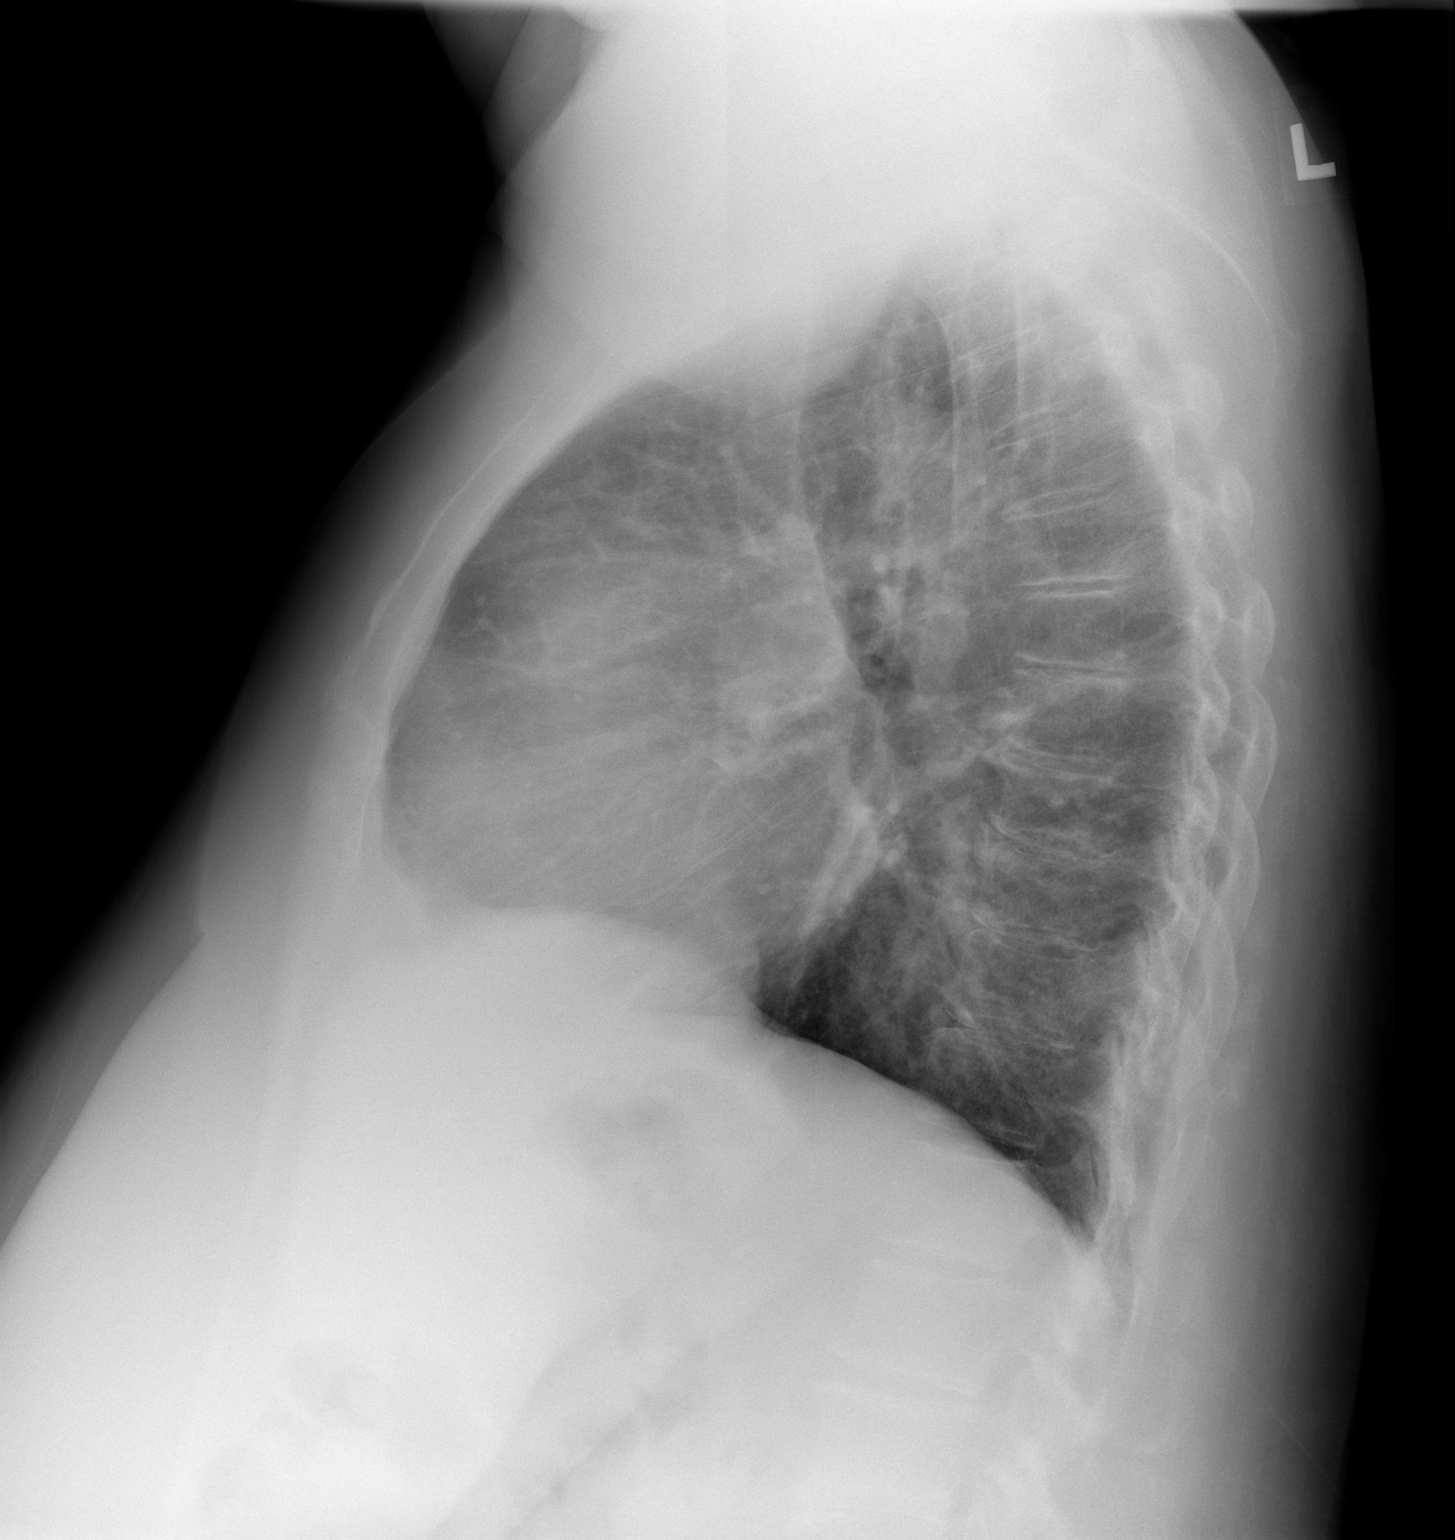

[2 of 2 positions shown; findings below may reference images not displayed]

FINDINGS: Stable mild cardiomegaly. Central pulmonary vasculature appears
stable, with some degree of suspected pulmonary artery hypertension.
Lungs are clear. No evidence of pneumonia. No pleural effusion or
pneumothorax seen.

Mild degenerative spurring noted within the thoracic spine. Stable
mild dextroscoliosis of the thoracic spine. No acute-appearing
osseous abnormality.
IMPRESSION: No active cardiopulmonary disease.

## 2016-02-14 IMAGING — CR DG KNEE COMPLETE 4+V*R*
3 series · 3 of 3 positions shown · non-contrast
Comparison: None.

CLINICAL DATA: Medial right knee pain for 5 months. Pain radiating
to lateral side of right leg for 2 weeks.

EXAM:
RIGHT KNEE - COMPLETE 4+ VIEW

[w knee obl. right (1 of 2)]
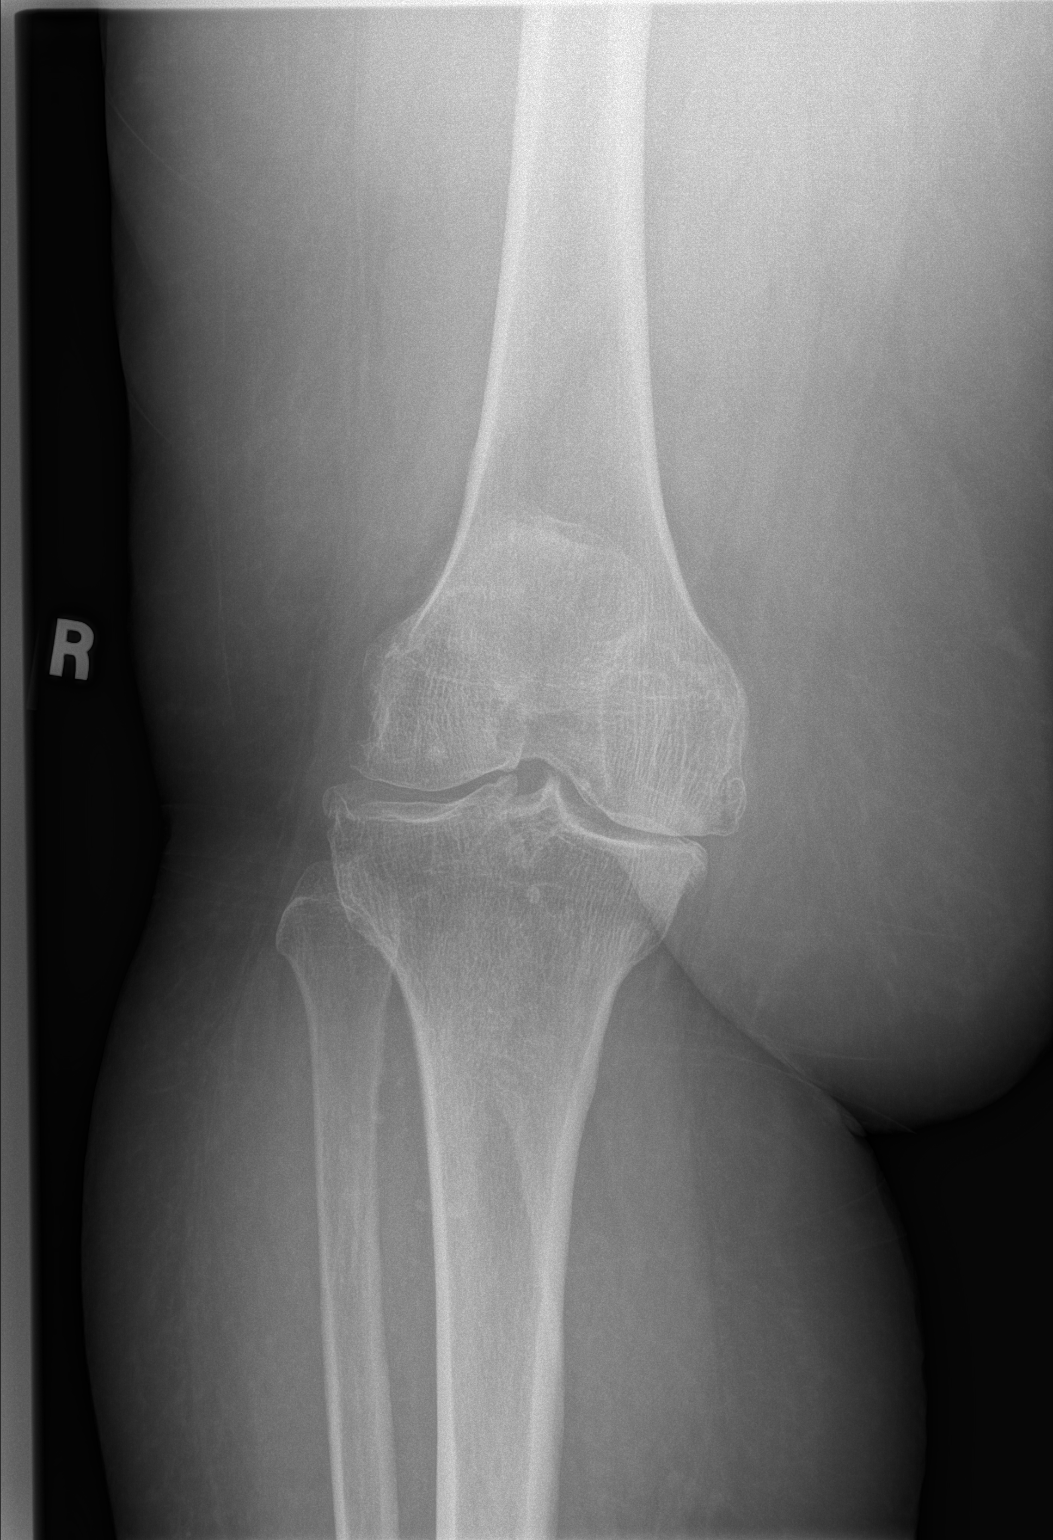

[w knee obl. right (2 of 2)]
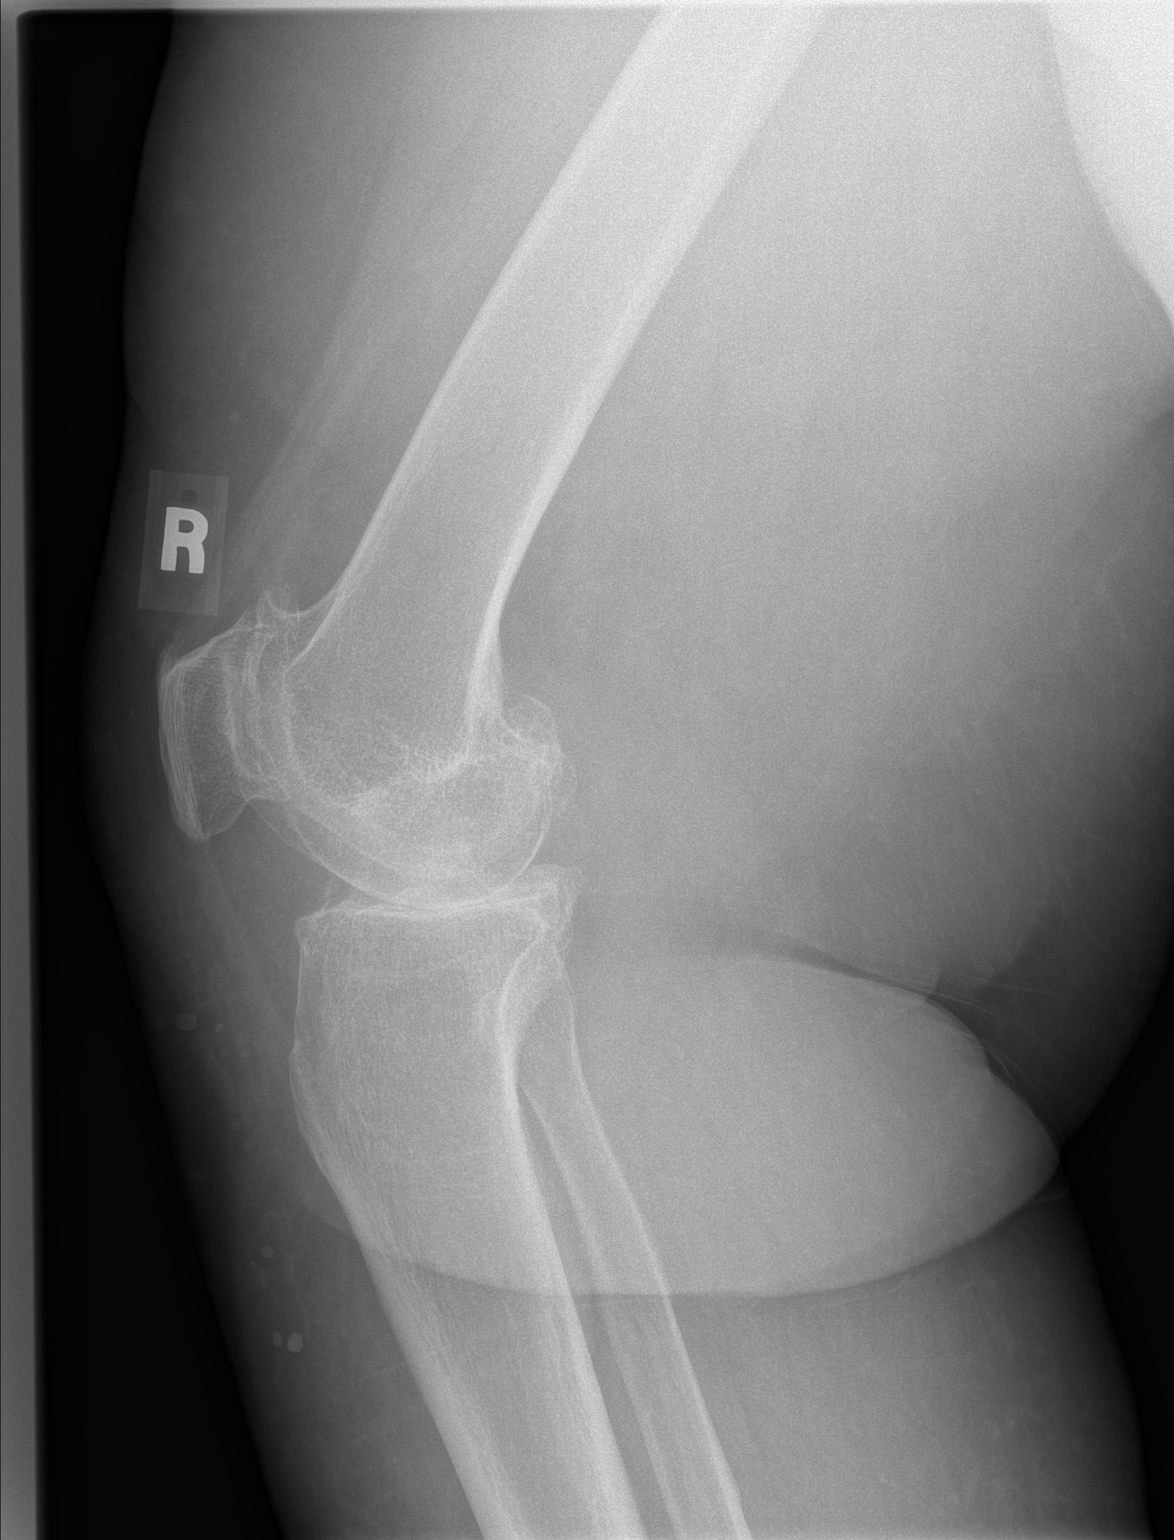

[w knee lat. right]
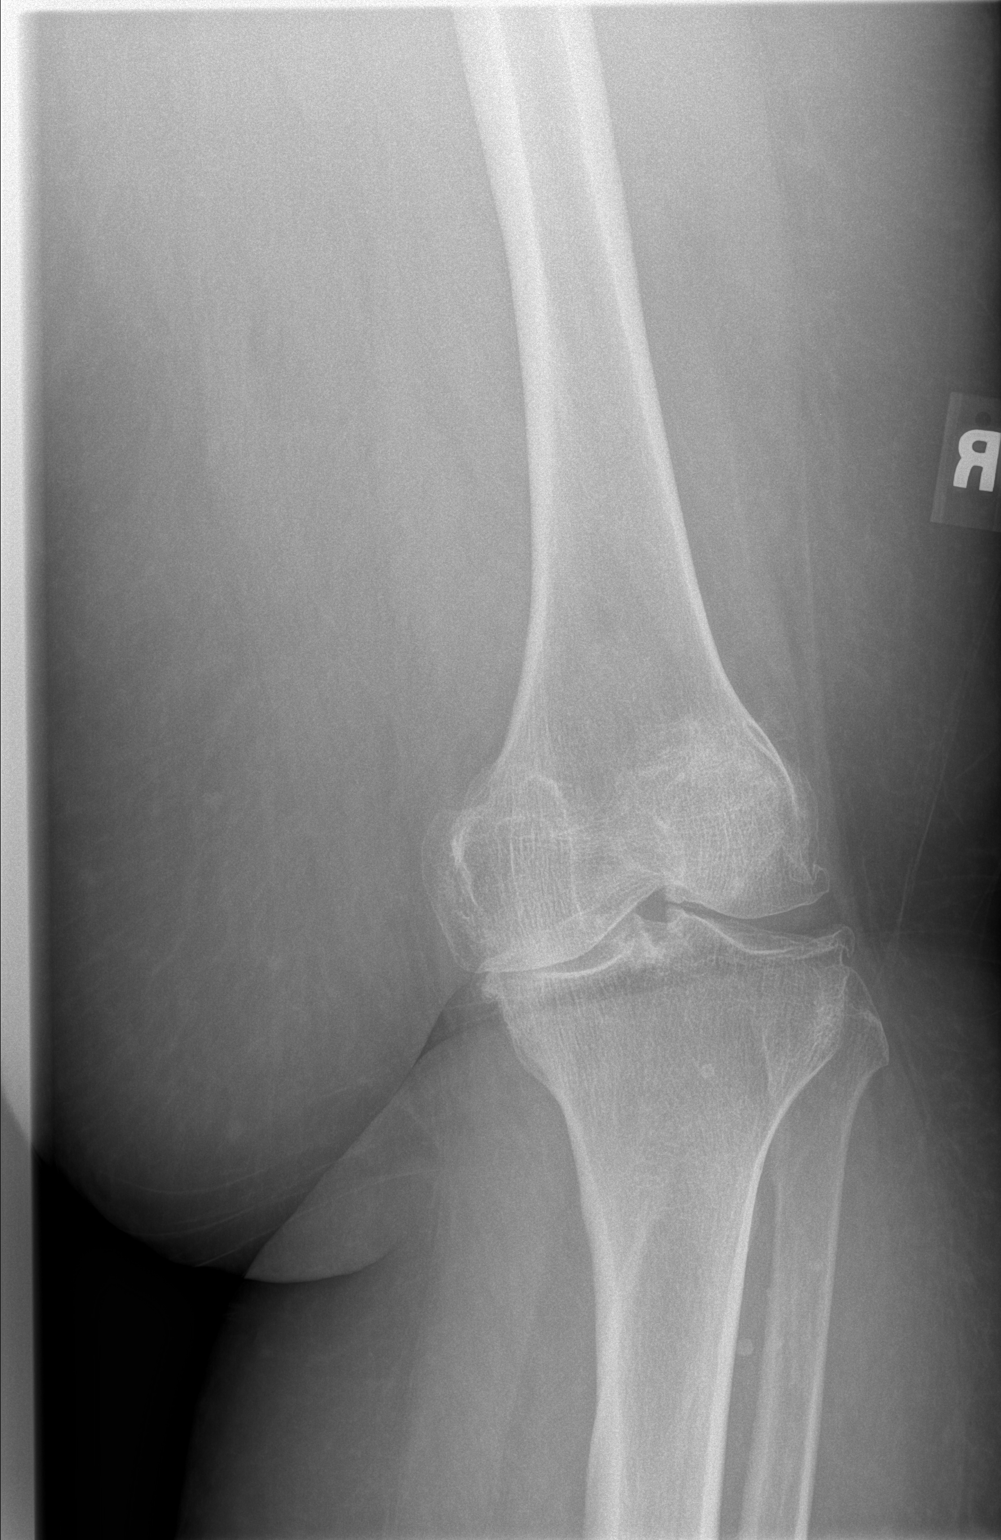

[3 of 3 positions shown; findings below may reference images not displayed]

FINDINGS: Four views of the right knee are provided. There is tricompartmental
degenerative osteoarthritis, with most significant joint space
narrowing medially. Associated osseous spurring throughout, most
prominent spurring within the patellofemoral compartment.

No acute osseous malalignment. No fracture line or displaced
fracture fragment. No acute or suspicious osseous lesion. No
appreciable joint effusion and adjacent soft tissues are
unremarkable.

:
1. Tricompartmental degenerative osteoarthritis, at least moderate
in degree, most prominently involving the medial and patellofemoral
compartments.
2. No acute findings.

## 2016-02-14 IMAGING — CR DG KNEE COMPLETE 4+V*R*
1 series · 1 of 1 positions shown · non-contrast
Comparison: None.

CLINICAL DATA: Medial right knee pain for 5 months. Pain radiating
to lateral side of right leg for 2 weeks.

EXAM:
RIGHT KNEE - COMPLETE 4+ VIEW

[view not recorded]
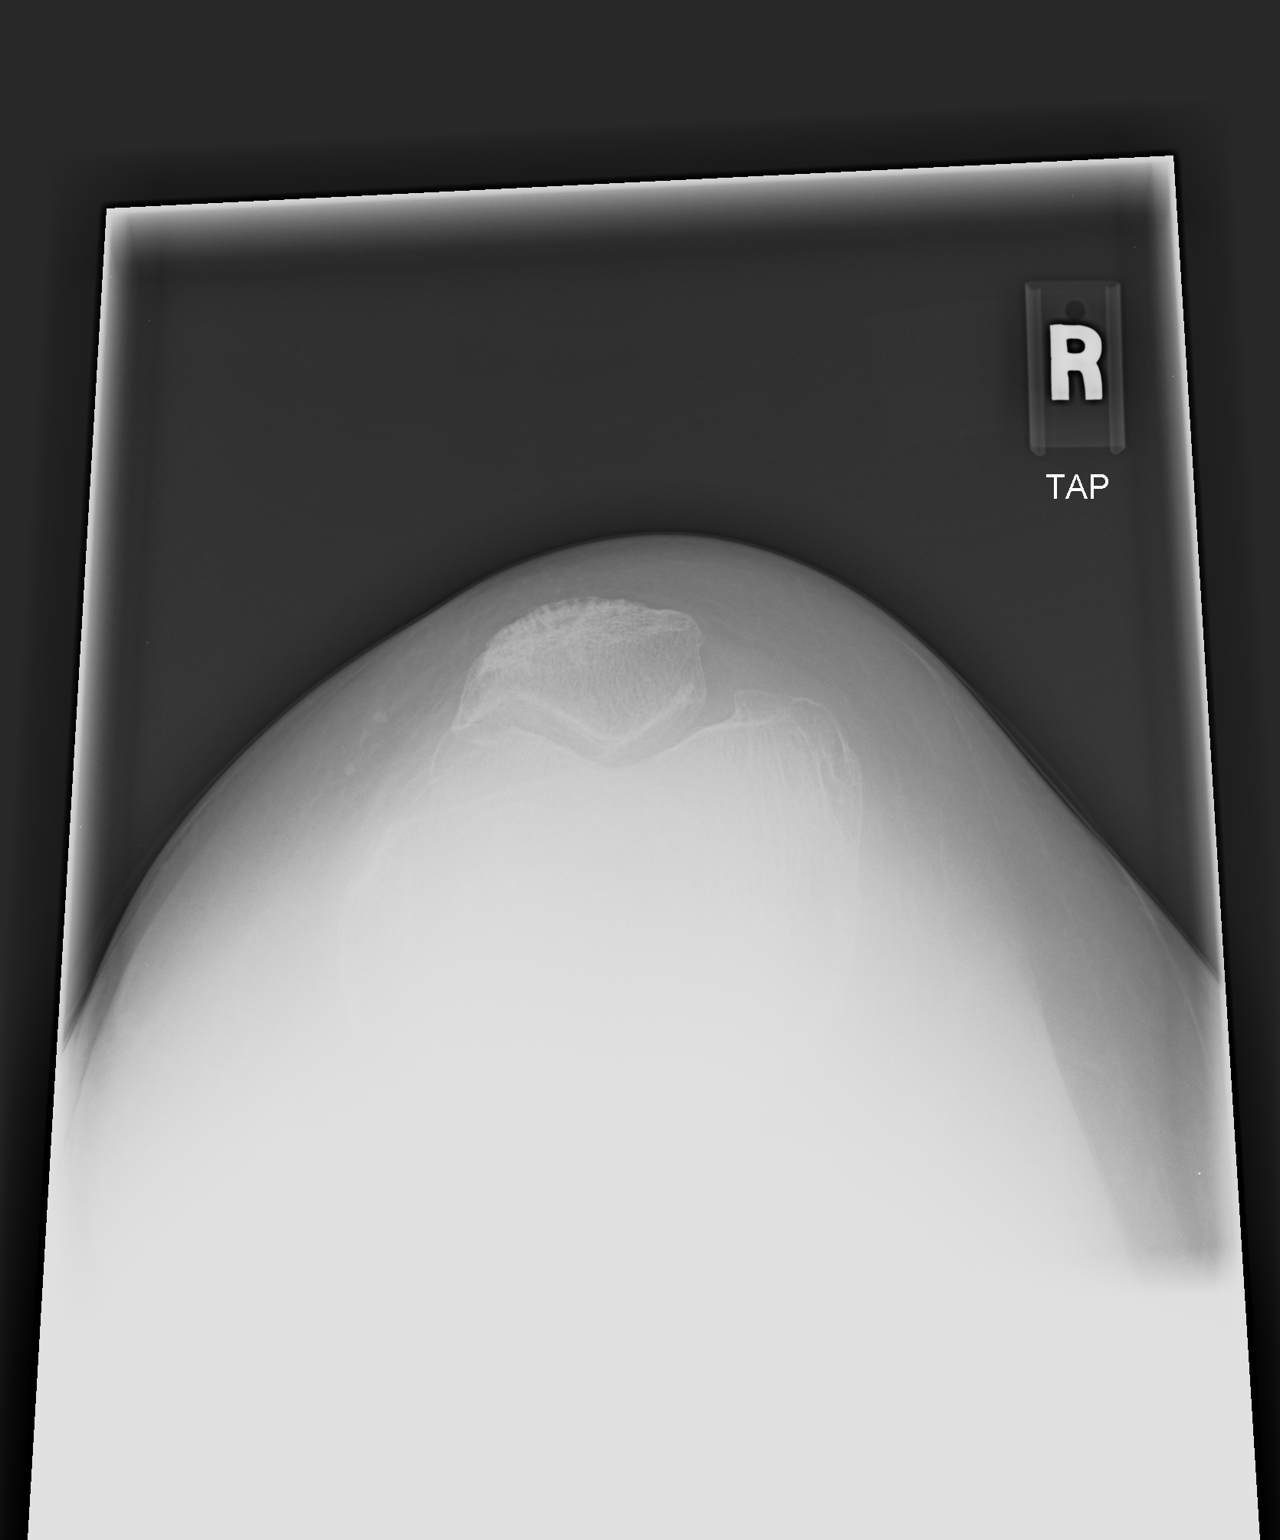

[1 of 1 positions shown; findings below may reference images not displayed]

FINDINGS: Four views of the right knee are provided. There is tricompartmental
degenerative osteoarthritis, with most significant joint space
narrowing medially. Associated osseous spurring throughout, most
prominent spurring within the patellofemoral compartment.

No acute osseous malalignment. No fracture line or displaced
fracture fragment. No acute or suspicious osseous lesion. No
appreciable joint effusion and adjacent soft tissues are
unremarkable.

:
1. Tricompartmental degenerative osteoarthritis, at least moderate
in degree, most prominently involving the medial and patellofemoral
compartments.
2. No acute findings.

## 2016-02-14 MED ORDER — HYDROCHLOROTHIAZIDE 25 MG PO TABS: 12.5000 mg | ORAL_TABLET | Freq: Every day | ORAL | Status: DC

## 2016-02-14 MED ORDER — POTASSIUM CHLORIDE CRYS ER 20 MEQ PO TBCR: 20.0000 meq | EXTENDED_RELEASE_TABLET | Freq: Every day | ORAL | Status: DC

## 2016-02-14 MED ORDER — MAGNESIUM OXIDE -MG SUPPLEMENT 200 MG PO TABS: 1.0000 | ORAL_TABLET | Freq: Every day | ORAL | Status: DC

## 2016-02-14 MED ORDER — OMEPRAZOLE 40 MG PO CPDR: 40.0000 mg | DELAYED_RELEASE_CAPSULE | Freq: Every day | ORAL | Status: DC

## 2016-02-14 NOTE — Progress Notes (Signed)
Subjective: Chief Complaint  Patient presents with  . Follow-up    on GERD. stiill has a dry cough. having some leg pains.    Here for f/u on GERD and cough, having some dry cough, worse after eating.  Usually sees Vickie here.  Takes Prilosec 1-2 times daily.   Been taking this about a year.   Has dry cough, not so much belching.  Does get sore throat at times.  She is a nonsmoker.   Doesn't eat close to bedtime.   Does eat some spicy foods and greasy foods.      Having some pains in right lateral leg. She notes problems with the leg for a long time.   Has a "vein" on the leg that hurts.  Using some Bengay topically which helps.   Not necessarily worse or better with activity. Sometimes uses tylenol for this.   thinks she is out of her BP pill.    Past Medical History  Diagnosis Date  . Hypertension   . Enlarged LA (left atrium)   . Hyperlipidemia   . Obesity   . Heavy cigarette smoker     Quit 2015  . Morbid obesity ([place])   . GERD (gastroesophageal reflux disease)   . Vertigo     on occasion  . Colon polyps   . Numbness and tingling   . Hard of hearing     Wears hearing aids.  . Breast cancer of upper-outer quadrant of left female breast ([place]) 01/31/2016   Past Surgical History  Procedure Laterality Date  . Tubal ligation    . Left heart catheterization with coronary angiogram N/A 10/21/2014    Procedure: LEFT HEART CATHETERIZATION WITH CORONARY ANGIOGRAM;  Surgeon: Burnell Blanks, MD;  Location: [facility] CATH LAB;  Service: Cardiovascular;  Laterality: N/A;  . Colonoscopy with propofol N/A 10/20/2015    Procedure: COLONOSCOPY WITH PROPOFOL;  Surgeon: Mauri Pole, MD;  Location: [facility] ENDOSCOPY;  Service: Endoscopy;  Laterality: N/A;  . Esophagogastroduodenoscopy (egd) with propofol N/A 10/20/2015    Procedure: ESOPHAGOGASTRODUODENOSCOPY (EGD) WITH PROPOFOL;  Surgeon: Mauri Pole, MD;  Location: [facility] ENDOSCOPY;  Service: Endoscopy;  Laterality: N/A;    ROS as in  subjective  Objective: BP 130/80 mmHg  Pulse 80  Wt 292 lb (132.45 kg)  Gen: wd, wn, morbidly obese AA female Lungs clear Heart RRR, normal s1, s2 no murmurs Ext: generalized edema of legs suggestive of lymphedema or lipidemia, non pitting Pulses 1+ of LE bilat Right lateral leg tender throughout, tender along knee in general, no laxity, but exam difficult given body habitus, wearing long pants, and having some difficulty with ambulation She is cautious of gait to some extent    Assessment: Encounter Diagnoses  Name Primary?  . Essential hypertension Yes  . Hypokalemia   . Hypomagnesemia   . Morbid obesity, unspecified obesity type ([place])   . Gastroesophageal reflux disease, esophagitis presence not specified   . Chronic cough   . Pain of right lower extremity   . Peripheral edema      Plan: discussed her several concerns.  GERD - increase omeprazole to 40mg  daily, avoid GERD triggers  Chronic cough - reviewed 10/2015 EGD.  Likely due to GERD.  For for CXR to rule out other  HTN - refilled medication at 90 day supply  Begin magnesium for cramps and given prior low Mg level  Pain in extremity and knee pain - go for knee xray, but may be multifactorial due to  obesity, varicose veins, cramping.     Reviewed recent 01/2016 BMET and CBC labs.   Halia was seen today for follow-up.  Diagnoses and all orders for this visit:  Essential hypertension  Hypokalemia -     potassium chloride SA (K-DUR,KLOR-CON) 20 MEQ tablet; Take 1 tablet (20 mEq total) by mouth daily.  Hypomagnesemia  Morbid obesity, unspecified obesity type ([place])  Gastroesophageal reflux disease, esophagitis presence not specified -     DG Chest 2 View; Future  Chronic cough -     DG Chest 2 View; Future  Pain of right lower extremity -     DG Knee Complete 4 Views Right; Future  Peripheral edema  Other orders -     hydrochlorothiazide (HYDRODIURIL) 25 MG tablet; Take 0.5 tablets (12.5 mg  total) by mouth daily. -     Magnesium Oxide 200 MG TABS; Take 1 tablet (200 mg total) by mouth daily. -     omeprazole (PRILOSEC) 40 MG capsule; Take 1 capsule (40 mg total) by mouth daily.

## 2016-02-14 NOTE — Telephone Encounter (Signed)
  Oncology Nurse Navigator Documentation    Navigator Encounter Type: Telephone;MDC Follow-up (Left vm for pt to return call regarding [facility] 02/08/16) (02/14/16 1200) Telephone: Sheila Jacobs Call;Clinic/MDC Follow-up (Contact information provided for return call) (02/14/16 1200)                                        Time Spent with Patient: 15 (02/14/16 1200)

## 2016-02-14 NOTE — Patient Instructions (Signed)
Recommendations:  Given the chronic cough, lets get a chest xray to make sure no mass or other findings  I suspect the cough is related to chronic acid reflux  Increase to Prilosec/Omeprazole 40mg  daily in the morning  Avoid spicy and greasy foods  Begin Magnesium tablets once daily for cramps in leg  Go for xray of right knee  I refilled your potassium and BP medication with 90 day supply  Drink plenty of water daily

## 2016-02-17 ENCOUNTER — Telehealth: Payer: Self-pay | Admitting: *Deleted

## 2016-02-17 NOTE — Telephone Encounter (Signed)
Left vm for pt to return call to discuss taking AI per Dr. Lindi Adie while she is deciding when to have sx. Contact information provided.

## 2016-02-20 ENCOUNTER — Telehealth: Payer: Self-pay | Admitting: *Deleted

## 2016-02-20 NOTE — Telephone Encounter (Signed)
error 

## 2016-02-20 NOTE — Telephone Encounter (Signed)
Left pt another msg to return call with contact information to discuss taking AI.

## 2016-03-06 DIAGNOSIS — M1711 Unilateral primary osteoarthritis, right knee: Secondary | ICD-10-CM | POA: Diagnosis not present

## 2016-03-07 ENCOUNTER — Ambulatory Visit (INDEPENDENT_AMBULATORY_CARE_PROVIDER_SITE_OTHER): Payer: Self-pay | Admitting: Neurology

## 2016-03-07 ENCOUNTER — Ambulatory Visit (INDEPENDENT_AMBULATORY_CARE_PROVIDER_SITE_OTHER): Payer: Medicare Other | Admitting: Neurology

## 2016-03-07 DIAGNOSIS — Z0289 Encounter for other administrative examinations: Secondary | ICD-10-CM

## 2016-03-07 DIAGNOSIS — M5442 Lumbago with sciatica, left side: Secondary | ICD-10-CM

## 2016-03-07 DIAGNOSIS — R202 Paresthesia of skin: Secondary | ICD-10-CM | POA: Diagnosis not present

## 2016-03-07 DIAGNOSIS — R269 Unspecified abnormalities of gait and mobility: Secondary | ICD-10-CM

## 2016-03-07 DIAGNOSIS — E785 Hyperlipidemia, unspecified: Secondary | ICD-10-CM

## 2016-03-07 NOTE — Procedures (Signed)
   NCS (NERVE CONDUCTION STUDY) WITH EMG (ELECTROMYOGRAPHY) REPORT   STUDY DATE: June 28th 2017 PATIENT NAME: Sheila Jacobs DOB: 1951-07-03 MRN: [id number]    TECHNOLOGIST: Laretta Alstrom ELECTROMYOGRAPHER: Marcial Pacas M.D.  CLINICAL INFORMATION:  65 years old female with history of chronic low back pain, obesity, now presenting with bilateral lower extremity paresthesia  FINDINGS: NERVE CONDUCTION STUDY: Bilateral peroneal sensory responses were absent. Bilateral peroneal to EDB motor responses showed moderately decreased C map amplitude, with normal distal latency conduction velocity. Bilateral tibial motor responses were absent. Bilateral tibial H reflexes were absent.  Right ulnar sensory and motor responses were normal. Right median sensory response showed mild to moderately prolonged peak latency, with well preserved snap amplitude, right median motor responses showed mildly prolonged distal latency, normal C map amplitude conduction velocity.  NEEDLE ELECTROMYOGRAPHY: Selected needle examination was performed at bilateral lower extremity muscles bilateral lumbosacral paraspinal muscles.  Needle examination of bilateral tibialis anterior, tibialis posterior, medial gastrocnemius, vastus lateralis, left biceps femoris long head was normal.  There was no spontaneous activity at bilateral lumbosacral paraspinal muscles, bilateral L4-5 S1.  IMPRESSION:   This is an abnormal study. There is electrodiagnostic evidence of length dependent mild axonal peripheral neuropathy. There is no evidence of bilateral lumbosacral radiculopathy; there is evidence of median neuropathy across the wrist, consistent with moderate right carpal tunnel syndrome.   INTERPRETING PHYSICIAN:   Marcial Pacas M.D. Ph.D. [facility] [address], [place], [place] [address] [phone]

## 2016-03-07 NOTE — Progress Notes (Signed)
Please see EMG nerve conduction report under the procedure section 

## 2016-03-08 ENCOUNTER — Telehealth: Payer: Self-pay | Admitting: *Deleted

## 2016-03-08 NOTE — Telephone Encounter (Signed)
Spoke with patient and she is planning on calling Dr. Lear Ng office on Monday to schedule an appointment.  She wants to wait on taking the AI.

## 2016-03-15 ENCOUNTER — Ambulatory Visit
Admission: RE | Admit: 2016-03-15 | Discharge: 2016-03-15 | Disposition: A | Discharge status: Home / Self Care | Payer: Medicare Other | Source: Ambulatory Visit | Attending: Neurology | Admitting: Neurology

## 2016-03-15 DIAGNOSIS — E785 Hyperlipidemia, unspecified: Secondary | ICD-10-CM

## 2016-03-15 DIAGNOSIS — R269 Unspecified abnormalities of gait and mobility: Secondary | ICD-10-CM | POA: Diagnosis not present

## 2016-03-15 DIAGNOSIS — R202 Paresthesia of skin: Secondary | ICD-10-CM

## 2016-03-19 ENCOUNTER — Telehealth: Payer: Self-pay | Admitting: Neurology

## 2016-03-19 NOTE — Telephone Encounter (Signed)
Left message for a return call. Need to discuss MRI results.

## 2016-03-19 NOTE — Telephone Encounter (Signed)
Spoke to patient she is aware of results

## 2016-03-19 NOTE — Telephone Encounter (Signed)
Please call patient MRI of the brain showed evidence of old stroke, overall no change compared to previous scan   IMPRESSION: Abnormal MRI scan the brain showing remote age small left inferior cerebellar infarcts and mild changes of chronic microvascular ischemia and chronic paranasal sinusitis. Overall no significant change compared with CT head dated 02/08/2015

## 2016-03-23 ENCOUNTER — Other Ambulatory Visit: Payer: Self-pay | Admitting: General Surgery

## 2016-03-23 ENCOUNTER — Encounter: Payer: Self-pay | Admitting: *Deleted

## 2016-03-23 DIAGNOSIS — C50912 Malignant neoplasm of unspecified site of left female breast: Secondary | ICD-10-CM

## 2016-04-10 ENCOUNTER — Ambulatory Visit
Admission: RE | Admit: 2016-04-10 | Discharge: 2016-04-10 | Disposition: A | Discharge status: Home / Self Care | Payer: Medicare Other | Source: Ambulatory Visit | Attending: General Surgery | Admitting: General Surgery

## 2016-04-10 ENCOUNTER — Encounter (HOSPITAL_COMMUNITY): Payer: Self-pay

## 2016-04-10 ENCOUNTER — Encounter (HOSPITAL_COMMUNITY)
Admission: RE | Admit: 2016-04-10 | Discharge: 2016-04-10 | Disposition: A | Discharge status: Home / Self Care | Payer: Medicare Other | Source: Ambulatory Visit | Attending: General Surgery | Admitting: General Surgery

## 2016-04-10 DIAGNOSIS — K219 Gastro-esophageal reflux disease without esophagitis: Secondary | ICD-10-CM | POA: Diagnosis not present

## 2016-04-10 DIAGNOSIS — Z170 Estrogen receptor positive status [ER+]: Secondary | ICD-10-CM | POA: Diagnosis not present

## 2016-04-10 DIAGNOSIS — F419 Anxiety disorder, unspecified: Secondary | ICD-10-CM | POA: Diagnosis not present

## 2016-04-10 DIAGNOSIS — E119 Type 2 diabetes mellitus without complications: Secondary | ICD-10-CM | POA: Diagnosis not present

## 2016-04-10 DIAGNOSIS — C50512 Malignant neoplasm of lower-outer quadrant of left female breast: Secondary | ICD-10-CM | POA: Diagnosis not present

## 2016-04-10 DIAGNOSIS — Z79899 Other long term (current) drug therapy: Secondary | ICD-10-CM | POA: Diagnosis not present

## 2016-04-10 DIAGNOSIS — C50912 Malignant neoplasm of unspecified site of left female breast: Secondary | ICD-10-CM

## 2016-04-10 DIAGNOSIS — I10 Essential (primary) hypertension: Secondary | ICD-10-CM | POA: Diagnosis not present

## 2016-04-10 DIAGNOSIS — D0512 Intraductal carcinoma in situ of left breast: Secondary | ICD-10-CM | POA: Diagnosis not present

## 2016-04-10 DIAGNOSIS — Z780 Asymptomatic menopausal state: Secondary | ICD-10-CM | POA: Diagnosis not present

## 2016-04-10 DIAGNOSIS — Z87891 Personal history of nicotine dependence: Secondary | ICD-10-CM | POA: Diagnosis not present

## 2016-04-10 DIAGNOSIS — Z6841 Body Mass Index (BMI) 40.0 and over, adult: Secondary | ICD-10-CM | POA: Diagnosis not present

## 2016-04-10 DIAGNOSIS — Z7982 Long term (current) use of aspirin: Secondary | ICD-10-CM | POA: Diagnosis not present

## 2016-04-10 HISTORY — PX: BREAST LUMPECTOMY: SHX2

## 2016-04-10 HISTORY — DX: Unspecified osteoarthritis, unspecified site: M19.90

## 2016-04-10 HISTORY — DX: Nontoxic goiter, unspecified: E04.9

## 2016-04-10 LAB — CBC
HCT: 37.9 % (ref 36.0–46.0)
Hemoglobin: 12.2 g/dL (ref 12.0–15.0)
MCH: 31 pg (ref 26.0–34.0)
MCHC: 32.2 g/dL (ref 30.0–36.0)
MCV: 96.2 fL (ref 78.0–100.0)
PLATELETS: 224 10*3/uL (ref 150–400)
RBC: 3.94 MIL/uL (ref 3.87–5.11)
RDW: 12.5 % (ref 11.5–15.5)
WBC: 4 10*3/uL (ref 4.0–10.5)

## 2016-04-10 LAB — BASIC METABOLIC PANEL
Anion gap: 5 (ref 5–15)
BUN: 15 mg/dL (ref 6–20)
CHLORIDE: 107 mmol/L (ref 101–111)
CO2: 26 mmol/L (ref 22–32)
CREATININE: 0.8 mg/dL (ref 0.44–1.00)
Calcium: 9.7 mg/dL (ref 8.9–10.3)
GFR calc Af Amer: >60 mL/min (ref >60)
GFR calc non Af Amer: >60 mL/min (ref >60)
Glucose, Bld: 99 mg/dL (ref 65–99)
Potassium: 3.6 mmol/L (ref 3.5–5.1)
SODIUM: 138 mmol/L (ref 135–145)

## 2016-04-10 MED ORDER — DEXTROSE 5 % IV SOLN
3.0000 g | INTRAVENOUS | Status: AC
  Administered 2016-04-11: 3 g via INTRAVENOUS
  Filled 2016-04-10: qty 3000

## 2016-04-10 NOTE — Progress Notes (Signed)
Anesthesia Chart Review: Patient is a 65 year old female scheduled for left breast lumpectomy with radioactive seed and sentinel lymph node biopsy on 04/11/16 by Dr. Excell Seltzer.  Scheduled for seed on 04/10/16 at 1 PM.  History includes left breast cancer, former smoker (quit 07/11/14), HTN, HLD, GERD, vertigo (occasionally), hard of hearing (hearing aids), morbid obesity (BMI 50), left atrial enlargement (09/2014 echo), colon polyps, goiter (benign biopsy '09). Normal coronaries by 2016 cath (done following evaluation by cardiologist Dr. [name] in 08/2014 for chest pain followed by high risk stress test--false positive). There was evidence of remote small left inferior cerebellar infarct on 03/19/16 MRI and 02/08/15 CT.   PCP is Harland Dingwall, NP. Neurologist is Dr. Marcial Pacas. HEM-ONC is Dr. Lindi Adie. RAD-ONC is Dr. Pablo Ledger. Endocrinologist is Dr. Dwyane Dee.  Meds include Xanax, ASA 81 mg, HCTZ, magnesium oxide, Prilosec, KCl.  PAT Vitals: BP 156/88, HR 79, RR 20, T 36.8, O2 sat 97%.  06/13/15 EKG: NSR, LAD.   10/21/14 Cardiac cath (Dr. Angelena Form): Impression: 1. No angiographic evidence of CAD 2. Normal LV systolic function 3. Non-cardiac chest pain Recommendations: No further ischemic workup.   10/14/14 Nuclear stress test: Overall Impression:  High risk stress nuclear study with a large, severe, reversible inferior defect consistent with severe inferior ischemia. LV Wall Motion:  NL LV Function; NL Wall Motion. Cardiac cath recommended.  09/18/14 Echo: Study Conclusions - Left ventricle: The cavity size was normal. Systolic function was normal. The estimated ejection fraction was in the range of 60% to 65%. Wall motion was normal; there were no regional wall motion abnormalities. Indeterminate diastolic function. Normal filling pressures. Mild focal basal septal hypertrophy. - Aortic valve: Trileaflet; mildly thickened leaflets. - Mitral valve: There was mild regurgitation. - Left atrium: The  atrium was severely dilated. Volume/bsa, S: 42.3 ml/m^2. - Right atrium: The atrium was mildly dilated. - Tricuspid valve: There was mild regurgitation. - Inferior vena cava: The vessel was small, appearing collapsed, consistent with low central venous pressure.  10/08/14 Carotid duplex: Bilateral proximal ICAs demonstrated a mild amount of fibrous plaque without evidence of stenosis. Bilateral vertebral arteries had normal antegrade flow. No additional follow-up recommended at that time Juluis Mire, NP).   02/14/16 CXR: IMPRESSION: No active cardiopulmonary disease.  Preoperative labs noted.  Cath last year showed normal coronaries and LVEF. Labs WNL. If no acute changes then I anticipate that she can proceed as planned. She is morbidly obese. Definitive anesthesia plan following anesthesiologist evaluation on the day of surgery.   George Hugh [facility] Short Stay Center/Anesthesiology Phone [phone] 04/10/2016 11:44 AM

## 2016-04-10 NOTE — H&P (Signed)
History of Present Illness The patient is a 65 year old female who presents with breast cancer. She is a 65 year old postmenopausal female referred by Dr. Dorise Bullion for evaluation of recently diagnosed carcinoma of the left breast. She recently presented for a screening mamogram revealing a possible mass. Subsequent imaging included diagnostic mamogram showing a partially circumscribed oval mass in the central left breast containing calcifications and ultrasound showing a somewhat suspicious 9 mm mass at the 12:30 to 1:00 position of the left breast 11 cm from the nipple. An ultrasound guided breast biopsy was performed on 01/27/2016 with pathology revealing nvasive ductal carcinoma and the DCIS of the breast. She is seen now in breast multidisciplinary clinic for initial treatment planning. She has experienced no breast symptoms, specifically lump or pain or nipple discharge or skin changes. She does not have a personal history of any previous breast problems. she has had a hard time believing that she even has breast cancer  Findings at that time were the following: Tumor size: .9 cm Tumor grade: 1-2 ki-67 10% Estrogen Receptor: ositive Progesterone Receptor: positive Her-2 neu: negative Lymph node status: negative    Other Problems  Anxiety Disorder Diabetes Mellitus High blood pressure Migraine Headache  Past Surgical History Breast Biopsy Left.  Diagnostic Studies History  Colonoscopy 1-5 years ago Mammogram within last year Pap Smear 1-5 years ago CARDIAC CATHETERIZATION, COMBINED RIGHT AND LEFT HEART (41324) Within Normal Limits.  Medication History  ALPRAZolam (0.'25MG'$  Tablet, Oral) Active. HydroCHLOROthiazide ('25MG'$  Tablet, Oral) Active. Klor-Con M20 [facility] Tablet ER, Oral) Active. Omeprazole ('20MG'$  Capsule DR, Oral) Active. Acetaminophen ('500MG'$  Tablet, Oral) Active. Aspirin ('81MG'$  Tablet DR, Oral) Active. Calcium Citrate + D (250-'200MG'$ -UNIT  Tablet, Oral) Active. Medications Reconciled  Social History  Caffeine use Coffee, Tea. No alcohol use No drug use Tobacco use Former smoker.  Family History  Hypertension Daughter.  Pregnancy / Birth History  Age at menarche 70 years. Age of menopause 76-50 Gravida 1 Maternal age 47-25 Para 1 Regular periods    Review of Systems  General Not Present- Appetite Loss, Chills, Fatigue, Fever, Night Sweats, Weight Gain and Weight Loss. Skin Not Present- Change in Wart/Mole, Dryness, Hives, Jaundice, New Lesions, Non-Healing Wounds, Rash and Ulcer. HEENT Present- Hearing Loss and Wears glasses/contact lenses. Not Present- Earache, Hoarseness, Nose Bleed, Oral Ulcers, Ringing in the Ears, Seasonal Allergies, Sinus Pain, Sore Throat, Visual Disturbances and Yellow Eyes. Respiratory Not Present- Bloody sputum, Chronic Cough, Difficulty Breathing, Snoring and Wheezing. Breast Not Present- Breast Mass, Breast Pain, Nipple Discharge and Skin Changes. Cardiovascular Present- Leg Cramps, Palpitations and Swelling of Extremities. Not Present- Chest Pain, Difficulty Breathing Lying Down, Rapid Heart Rate and Shortness of Breath. Gastrointestinal Not Present- Abdominal Pain, Bloating, Bloody Stool, Change in Bowel Habits, Chronic diarrhea, Constipation, Difficulty Swallowing, Excessive gas, Gets full quickly at meals, Hemorrhoids, Indigestion, Nausea, Rectal Pain and Vomiting. Musculoskeletal Present- Swelling of Extremities. Not Present- Back Pain, Joint Pain, Joint Stiffness, Muscle Pain and Muscle Weakness. Neurological Present- Headaches. Not Present- Decreased Memory, Fainting, Numbness, Seizures, Tingling, Tremor, Trouble walking and Weakness. Psychiatric Present- Anxiety. Not Present- Bipolar, Change in Sleep Pattern, Depression, Fearful and Frequent crying. Endocrine Not Present- Cold Intolerance, Excessive Hunger, Hair Changes, Heat Intolerance, Hot flashes and New  Diabetes. Hematology Not Present- Easy Bruising, Excessive bleeding, Gland problems, HIV and Persistent Infections.   Physical Exam  The physical exam findings are as follows: Note:General: Alert, obese African-American female, in no distress Skin: Warm and dry without rash or infection.  HEENT: Large soft diffuse goiter palpable on the right Sclera nonicteric. Pupils equal round and reactive. Oropharynx clear. edentulous Lymph nodes: No cervical, supraclavicular nodes palpable. Breasts: Very large breasts bilaterally. I cannot feel any masses in either breast. No skin or nipple changes. Lungs: Breath sounds clear and equal. No wheezing or increased work of breathing. Cardiovascular: Regular rate and rhythm without murmer. 2+ bilateral lower extremity edema. Abdomen:obese. Nondistended. Soft and nontender. No masses palpable. No organomegaly. No palpable hernias. Extremities: ignificant bilateral lower extremity edema No chronic venous stasis changes. Neurologic: Alert and fully oriented. Gait normal. No focal weakness. Psychiatric: Normal mood and affect. Thought content appropriate with somewhat limited judgment and insight    Assessment & Plan STAGE I BREAST CANCER, LEFT (C50.912) Impression: 65 year old female with a new diagnosis of cancer of the left breast, upper outer quadrant. Clinical stage 1a, ER positive, PR positive, HER-2 negative. I discussed with the patient initial surgical treatment options. her insight and understanding is somewhat limited and it was difficult to convince her that she even has a breast cancer but I think she now understands that We discussed options of breast conservation with lumpectomy or total mastectomy and sentinal lymph node biopsy/dissection. she has a small cancer with large breasts and lumpectomy seems most appropriate and she is agreeable. She is not sure about radiation therapy. After discussion she has elected to proceed with radioactiary sentinel  lymph node biopt lumpectomy and axillary sentinel lymph node biopsy. We discussed the indications and nature of the procedure, and expected recovery, in detail. Surgical risks including anesthetic complications, cardiorespiratory complications, bleeding, infection, wound healing complications, blood clots, lymphedema, local and distant recurrence and possible need for further surgery based on the final pathology was discussed and understood. Chemotherapy, hormonal therapy and radiation therapy have been discussed. They have been provided with literature regarding the treatment of breast cancer. All questions were answered. They understand and agree to proceed and we will go ahead with scheduling.  Current Plans  Pt Education - CCS Breast Cancer Information Given - [facility] "Breast Journey" Package radioactive seed localized left breast lumpectomy and left axillary Sentinel lymph node biopsy under general anesthesia as an outpatient  You are being scheduled for surgery - Our schedulers will call you.

## 2016-04-10 NOTE — Pre-Procedure Instructions (Signed)
Sheila Jacobs  04/10/2016      [facility] [place], [place] - [address] [place] [address] Phone: [phone] Fax: [phone]    Your procedure is scheduled on Wednesday, April 11, 2016  Report to [facility] Admitting at 10:45 A.M.  Call this number if you have problems the morning of surgery:  [phone]   Remember:  Do not eat food or drink liquids after midnight.  Take these medicines the morning of surgery with A SIP OF WATER : Omeprazole ( Prilosec), if needed: Alprazolam ( Xanax) for anxiety Stop taking Aspirin, vitamins, fish oil and herbal medications. Do not take any NSAIDs ie: Ibuprofen, Advil, Naproxen, BC and Goody Powder or any medication containing Aspirin; stop now.  Do not wear jewelry, make-up or nail polish.  Do not wear lotions, powders, or perfumes.  You may not wear deoderant.  Do not shave 48 hours prior to surgery.    Do not bring valuables to the hospital.  [facility] is not responsible for any belongings or valuables.  Contacts, dentures or bridgework may not be worn into surgery.  Leave your suitcase in the car.  After surgery it may be brought to your room.  For patients admitted to the hospital, discharge time will be determined by your treatment team.  Patients discharged the day of surgery will not be allowed to drive home.   Name and phone number of your driver:  Special instructions: [place] - Preparing for Surgery  Before surgery, you can play an important role.  Because skin is not sterile, your skin needs to be as free of germs as possible.  You can reduce the number of germs on you skin by washing with CHG (chlorahexidine gluconate) soap before surgery.  CHG is an antiseptic cleaner which kills germs and bonds with the skin to continue killing germs even after washing.  Please DO NOT use if you have an allergy to CHG or antibacterial soaps.  If your skin becomes  reddened/irritated stop using the CHG and inform your nurse when you arrive at Short Stay.  Do not shave (including legs and underarms) for at least 48 hours prior to the first CHG shower.  You may shave your face.  Please follow these instructions carefully:   1.  Shower with CHG Soap the night before surgery and the morning of Surgery.  2.  If you choose to wash your hair, wash your hair first as usual with your normal shampoo.  3.  After you shampoo, rinse your hair and body thoroughly to remove the Shampoo.  4.  Use CHG as you would any other liquid soap.  You can apply chg directly  to the skin and wash gently with scrungie or a clean washcloth.  5.  Apply the CHG Soap to your body ONLY FROM THE NECK DOWN.  Do not use on open wounds or open sores.  Avoid contact with your eyes, ears, mouth and genitals (private parts).  Wash genitals (private parts) with your normal soap.  6.  Wash thoroughly, paying special attention to the area where your surgery will be performed.  7.  Thoroughly rinse your body with warm water from the neck down.  8.  DO NOT shower/wash with your normal soap after using and rinsing off the CHG Soap.  9.  Pat yourself dry with a clean towel.  10.  Wear clean pajamas.            11.  Place clean sheets on your bed the night of your first shower and do not sleep with pets.  Day of Surgery  Do not apply any lotions/deodorants the morning of surgery.  Please wear clean clothes to the hospital/surgery center.  Please read over the following fact sheets that you were given. Pain Booklet, Coughing and Deep Breathing and Surgical Site Infection Prevention

## 2016-04-10 NOTE — Progress Notes (Signed)
Pt denies any acute cardiopulmonary issues. Pt stated that she is under the care of Dr. [name], Cardiology. Pt denies having labs drawn within the last 2 weeks. Pt chart forwarded to anesthesia to review cardiac history.

## 2016-04-11 ENCOUNTER — Ambulatory Visit (HOSPITAL_COMMUNITY): Payer: Medicare Other

## 2016-04-11 ENCOUNTER — Encounter (HOSPITAL_COMMUNITY)
Admission: RE | Admit: 2016-04-11 | Discharge: 2016-04-11 | Disposition: A | Discharge status: Home / Self Care | Payer: Medicare Other | Source: Ambulatory Visit | Attending: General Surgery | Admitting: General Surgery

## 2016-04-11 ENCOUNTER — Ambulatory Visit (HOSPITAL_COMMUNITY): Payer: Medicare Other | Admitting: Anesthesiology

## 2016-04-11 ENCOUNTER — Encounter (HOSPITAL_COMMUNITY)
Admission: RE | Disposition: A | Discharge status: Home / Self Care | Payer: Self-pay | Source: Ambulatory Visit | Attending: General Surgery

## 2016-04-11 ENCOUNTER — Ambulatory Visit
Admission: RE | Admit: 2016-04-11 | Discharge: 2016-04-11 | Disposition: A | Discharge status: Home / Self Care | Payer: Medicare Other | Source: Ambulatory Visit | Attending: General Surgery | Admitting: General Surgery

## 2016-04-11 ENCOUNTER — Ambulatory Visit (HOSPITAL_COMMUNITY): Payer: Medicare Other | Admitting: Vascular Surgery

## 2016-04-11 ENCOUNTER — Ambulatory Visit (HOSPITAL_BASED_OUTPATIENT_CLINIC_OR_DEPARTMENT_OTHER)
Admission: RE | Admit: 2016-04-11 | Discharge: 2016-04-11 | Disposition: A | Discharge status: Home / Self Care | Payer: Medicare Other | Source: Ambulatory Visit | Attending: General Surgery | Admitting: General Surgery

## 2016-04-11 ENCOUNTER — Encounter (HOSPITAL_COMMUNITY): Payer: Self-pay | Admitting: Anesthesiology

## 2016-04-11 ENCOUNTER — Other Ambulatory Visit: Payer: Self-pay

## 2016-04-11 DIAGNOSIS — K219 Gastro-esophageal reflux disease without esophagitis: Secondary | ICD-10-CM | POA: Diagnosis not present

## 2016-04-11 DIAGNOSIS — Z780 Asymptomatic menopausal state: Secondary | ICD-10-CM | POA: Diagnosis not present

## 2016-04-11 DIAGNOSIS — Z79899 Other long term (current) drug therapy: Secondary | ICD-10-CM | POA: Insufficient documentation

## 2016-04-11 DIAGNOSIS — D0512 Intraductal carcinoma in situ of left breast: Secondary | ICD-10-CM | POA: Diagnosis not present

## 2016-04-11 DIAGNOSIS — N6012 Diffuse cystic mastopathy of left breast: Secondary | ICD-10-CM | POA: Diagnosis not present

## 2016-04-11 DIAGNOSIS — C50912 Malignant neoplasm of unspecified site of left female breast: Secondary | ICD-10-CM | POA: Insufficient documentation

## 2016-04-11 DIAGNOSIS — Z87891 Personal history of nicotine dependence: Secondary | ICD-10-CM | POA: Insufficient documentation

## 2016-04-11 DIAGNOSIS — E119 Type 2 diabetes mellitus without complications: Secondary | ICD-10-CM | POA: Insufficient documentation

## 2016-04-11 DIAGNOSIS — Z170 Estrogen receptor positive status [ER+]: Secondary | ICD-10-CM | POA: Diagnosis not present

## 2016-04-11 DIAGNOSIS — F419 Anxiety disorder, unspecified: Secondary | ICD-10-CM | POA: Insufficient documentation

## 2016-04-11 DIAGNOSIS — Z6841 Body Mass Index (BMI) 40.0 and over, adult: Secondary | ICD-10-CM | POA: Diagnosis not present

## 2016-04-11 DIAGNOSIS — C50412 Malignant neoplasm of upper-outer quadrant of left female breast: Secondary | ICD-10-CM

## 2016-04-11 DIAGNOSIS — I10 Essential (primary) hypertension: Secondary | ICD-10-CM | POA: Insufficient documentation

## 2016-04-11 DIAGNOSIS — R928 Other abnormal and inconclusive findings on diagnostic imaging of breast: Secondary | ICD-10-CM | POA: Diagnosis not present

## 2016-04-11 DIAGNOSIS — C50512 Malignant neoplasm of lower-outer quadrant of left female breast: Secondary | ICD-10-CM | POA: Diagnosis not present

## 2016-04-11 DIAGNOSIS — Z7982 Long term (current) use of aspirin: Secondary | ICD-10-CM | POA: Diagnosis not present

## 2016-04-11 DIAGNOSIS — M1990 Unspecified osteoarthritis, unspecified site: Secondary | ICD-10-CM | POA: Diagnosis not present

## 2016-04-11 HISTORY — PX: BREAST LUMPECTOMY WITH RADIOACTIVE SEED AND SENTINEL LYMPH NODE BIOPSY: SHX6550

## 2016-04-11 SURGERY — BREAST LUMPECTOMY WITH RADIOACTIVE SEED AND SENTINEL LYMPH NODE BIOPSY
Anesthesia: General | Site: Breast | Laterality: Left

## 2016-04-11 MED ORDER — LACTATED RINGERS IV SOLN
INTRAVENOUS | Status: DC
  Administered 2016-04-11: 11:00:00 via INTRAVENOUS

## 2016-04-11 MED ORDER — FENTANYL CITRATE (PF) 100 MCG/2ML IJ SOLN
50.0000 ug | Freq: Once | INTRAMUSCULAR | Status: AC
  Administered 2016-04-11: 50 ug via INTRAVENOUS

## 2016-04-11 MED ORDER — BUPIVACAINE-EPINEPHRINE (PF) 0.25% -1:200000 IJ SOLN
INTRAMUSCULAR | Status: AC
  Filled 2016-04-11: qty 30

## 2016-04-11 MED ORDER — HYDROMORPHONE HCL 1 MG/ML IJ SOLN: 0.2500 mg | INTRAMUSCULAR | Status: DC | PRN

## 2016-04-11 MED ORDER — LACTATED RINGERS IV SOLN
INTRAVENOUS | Status: DC | PRN
  Administered 2016-04-11 (×2): via INTRAVENOUS

## 2016-04-11 MED ORDER — FENTANYL CITRATE (PF) 100 MCG/2ML IJ SOLN
INTRAMUSCULAR | Status: AC
  Administered 2016-04-11: 50 ug via INTRAVENOUS
  Filled 2016-04-11: qty 2

## 2016-04-11 MED ORDER — ONDANSETRON HCL 4 MG/2ML IJ SOLN
INTRAMUSCULAR | Status: AC
  Filled 2016-04-11: qty 2

## 2016-04-11 MED ORDER — SUCCINYLCHOLINE CHLORIDE 200 MG/10ML IV SOSY
PREFILLED_SYRINGE | INTRAVENOUS | Status: AC
  Filled 2016-04-11: qty 10

## 2016-04-11 MED ORDER — SODIUM CHLORIDE 0.9 % IJ SOLN
INTRAMUSCULAR | Status: AC
  Filled 2016-04-11: qty 10

## 2016-04-11 MED ORDER — EPHEDRINE SULFATE 50 MG/ML IJ SOLN
INTRAMUSCULAR | Status: DC | PRN
  Administered 2016-04-11: 15 mg via INTRAVENOUS

## 2016-04-11 MED ORDER — OXYCODONE HCL 5 MG PO TABS: 5.0000 mg | ORAL_TABLET | Freq: Once | ORAL | Status: DC | PRN

## 2016-04-11 MED ORDER — MIDAZOLAM HCL 2 MG/2ML IJ SOLN
INTRAMUSCULAR | Status: AC
  Administered 2016-04-11: 1 mg via INTRAVENOUS
  Filled 2016-04-11: qty 2

## 2016-04-11 MED ORDER — OXYCODONE HCL 5 MG/5ML PO SOLN: 5.0000 mg | Freq: Once | ORAL | Status: DC | PRN

## 2016-04-11 MED ORDER — CHLORHEXIDINE GLUCONATE 4 % EX LIQD: 1.0000 "application " | Freq: Once | CUTANEOUS | Status: DC

## 2016-04-11 MED ORDER — LIDOCAINE 2% (20 MG/ML) 5 ML SYRINGE
INTRAMUSCULAR | Status: AC
  Filled 2016-04-11: qty 5

## 2016-04-11 MED ORDER — HYDROCODONE-ACETAMINOPHEN 5-325 MG PO TABS: 1.0000 | ORAL_TABLET | ORAL | 0 refills | Status: DC | PRN

## 2016-04-11 MED ORDER — METHYLENE BLUE 0.5 % INJ SOLN
INTRAVENOUS | Status: AC
  Filled 2016-04-11: qty 10

## 2016-04-11 MED ORDER — 0.9 % SODIUM CHLORIDE (POUR BTL) OPTIME
TOPICAL | Status: DC | PRN
  Administered 2016-04-11: 1000 mL

## 2016-04-11 MED ORDER — PROPOFOL 10 MG/ML IV BOLUS
INTRAVENOUS | Status: AC
  Filled 2016-04-11: qty 20

## 2016-04-11 MED ORDER — FENTANYL CITRATE (PF) 250 MCG/5ML IJ SOLN
INTRAMUSCULAR | Status: AC
  Filled 2016-04-11: qty 5

## 2016-04-11 MED ORDER — PROPOFOL 10 MG/ML IV BOLUS
INTRAVENOUS | Status: DC | PRN
  Administered 2016-04-11: 150 mg via INTRAVENOUS
  Administered 2016-04-11: 50 mg via INTRAVENOUS

## 2016-04-11 MED ORDER — MIDAZOLAM HCL 2 MG/2ML IJ SOLN
1.0000 mg | Freq: Once | INTRAMUSCULAR | Status: AC
  Administered 2016-04-11: 1 mg via INTRAVENOUS

## 2016-04-11 MED ORDER — ONDANSETRON HCL 4 MG/2ML IJ SOLN: 4.0000 mg | Freq: Four times a day (QID) | INTRAMUSCULAR | Status: DC | PRN

## 2016-04-11 MED ORDER — FENTANYL CITRATE (PF) 100 MCG/2ML IJ SOLN
INTRAMUSCULAR | Status: DC | PRN
  Administered 2016-04-11 (×2): 50 ug via INTRAVENOUS

## 2016-04-11 MED ORDER — BUPIVACAINE-EPINEPHRINE 0.25% -1:200000 IJ SOLN
INTRAMUSCULAR | Status: DC | PRN
  Administered 2016-04-11: 20 mL

## 2016-04-11 MED ORDER — ONDANSETRON HCL 4 MG/2ML IJ SOLN
INTRAMUSCULAR | Status: DC | PRN
  Administered 2016-04-11: 4 mg via INTRAVENOUS

## 2016-04-11 MED ORDER — TECHNETIUM TC 99M SULFUR COLLOID FILTERED
1.0000 | Freq: Once | INTRAVENOUS | Status: AC | PRN
Start: 2016-04-11 — End: 2016-04-11
  Administered 2016-04-11: 1 via INTRADERMAL

## 2016-04-11 MED ORDER — LIDOCAINE HCL (CARDIAC) 20 MG/ML IV SOLN
INTRAVENOUS | Status: DC | PRN
  Administered 2016-04-11: 50 mg via INTRAVENOUS

## 2016-04-11 SURGICAL SUPPLY — 54 items
APPLIER CLIP 9.375 MED OPEN (MISCELLANEOUS)
APR CLP MED 9.3 20 MLT OPN (MISCELLANEOUS)
BINDER BREAST XXLRG (GAUZE/BANDAGES/DRESSINGS) ×2 IMPLANT
BLADE SURG 10 STRL SS (BLADE) ×2 IMPLANT
CANISTER SUCTION 2500CC (MISCELLANEOUS) ×3 IMPLANT
CHLORAPREP W/TINT 26ML (MISCELLANEOUS) ×3 IMPLANT
CLIP APPLIE 9.375 MED OPEN (MISCELLANEOUS) ×1 IMPLANT
CONT SPEC 4OZ CLIKSEAL STRL BL (MISCELLANEOUS) ×17 IMPLANT
COVER PROBE W GEL 5X96 (DRAPES) ×3 IMPLANT
COVER SURGICAL LIGHT HANDLE (MISCELLANEOUS) ×3 IMPLANT
DEVICE DUBIN SPECIMEN MAMMOGRA (MISCELLANEOUS) ×3 IMPLANT
DRAPE CHEST BREAST 15X10 FENES (DRAPES) ×3 IMPLANT
DRAPE UTILITY XL STRL (DRAPES) ×3 IMPLANT
DRSG PAD ABDOMINAL 8X10 ST (GAUZE/BANDAGES/DRESSINGS) ×6 IMPLANT
ELECT COATED BLADE 2.86 ST (ELECTRODE) ×3 IMPLANT
ELECT REM PT RETURN 9FT ADLT (ELECTROSURGICAL) ×3
ELECTRODE REM PT RTRN 9FT ADLT (ELECTROSURGICAL) ×1 IMPLANT
GLOVE BIO SURGEON STRL SZ7.5 (GLOVE) ×2 IMPLANT
GLOVE BIOGEL PI IND STRL 7.0 (GLOVE) IMPLANT
GLOVE BIOGEL PI IND STRL 7.5 (GLOVE) IMPLANT
GLOVE BIOGEL PI IND STRL 8 (GLOVE) ×1 IMPLANT
GLOVE BIOGEL PI INDICATOR 7.0 (GLOVE) ×2
GLOVE BIOGEL PI INDICATOR 7.5 (GLOVE) ×2
GLOVE BIOGEL PI INDICATOR 8 (GLOVE) ×2
GLOVE ECLIPSE 7.5 STRL STRAW (GLOVE) ×6 IMPLANT
GLOVE SURG SS PI 7.0 STRL IVOR (GLOVE) ×2 IMPLANT
GOWN STRL REUS W/ TWL LRG LVL3 (GOWN DISPOSABLE) ×1 IMPLANT
GOWN STRL REUS W/ TWL XL LVL3 (GOWN DISPOSABLE) ×1 IMPLANT
GOWN STRL REUS W/TWL LRG LVL3 (GOWN DISPOSABLE) ×6
GOWN STRL REUS W/TWL XL LVL3 (GOWN DISPOSABLE) ×6
ILLUMINATOR WAVEGUIDE N/F (MISCELLANEOUS) IMPLANT
KIT BASIN OR (CUSTOM PROCEDURE TRAY) ×3 IMPLANT
KIT MARKER MARGIN INK (KITS) ×3 IMPLANT
LIQUID BAND (GAUZE/BANDAGES/DRESSINGS) ×5 IMPLANT
NDL FILTER BLUNT 18X1 1/2 (NEEDLE) IMPLANT
NDL HYPO 25X1 1.5 SAFETY (NEEDLE) ×1 IMPLANT
NDL SAFETY ECLIPSE 18X1.5 (NEEDLE) IMPLANT
NEEDLE FILTER BLUNT 18X 1/2SAF (NEEDLE)
NEEDLE FILTER BLUNT 18X1 1/2 (NEEDLE) IMPLANT
NEEDLE HYPO 18GX1.5 SHARP (NEEDLE)
NEEDLE HYPO 25X1 1.5 SAFETY (NEEDLE) ×3 IMPLANT
NS IRRIG 1000ML POUR BTL (IV SOLUTION) ×3 IMPLANT
PACK SURGICAL SETUP 50X90 (CUSTOM PROCEDURE TRAY) ×3 IMPLANT
PENCIL BUTTON HOLSTER BLD 10FT (ELECTRODE) ×3 IMPLANT
SPONGE LAP 18X18 X RAY DECT (DISPOSABLE) ×3 IMPLANT
SUT MON AB 5-0 PS2 18 (SUTURE) ×4 IMPLANT
SUT VIC AB 3-0 SH 18 (SUTURE) ×5 IMPLANT
SYR BULB 3OZ (MISCELLANEOUS) ×3 IMPLANT
SYR CONTROL 10ML LL (SYRINGE) ×3 IMPLANT
TOWEL OR 17X24 6PK STRL BLUE (TOWEL DISPOSABLE) ×3 IMPLANT
TOWEL OR 17X26 10 PK STRL BLUE (TOWEL DISPOSABLE) ×3 IMPLANT
TUBE CONNECTING 12'X1/4 (SUCTIONS) ×1
TUBE CONNECTING 12X1/4 (SUCTIONS) ×2 IMPLANT
YANKAUER SUCT BULB TIP NO VENT (SUCTIONS) ×3 IMPLANT

## 2016-04-11 NOTE — Op Note (Signed)
Preoperative Diagnosis: LEFT BREAST CANCER  Postoprative Diagnosis: LEFT BREAST CANCER  Procedure: Procedure(s): BREAST LUMPECTOMY WITH RADIOACTIVE SEED AND SENTINEL LYMPH NODE BIOPSY   Surgeon: Excell Seltzer T   Assistants: None  Anesthesia:  General endotracheal anesthesia  Indications: Patient is a 65 year old female with a recent abnormal mammogram and biopsy of the left breast revealing an invasive ductal carcinoma, 9 mm, clinical stage I with negative nodes on ultrasound. After extensive workup and discussion detailed elsewhere we have elected to proceed with radioactive seed localized left breast lumpectomy and axillary sentinel lymph node biopsy as initial surgical therapy.    Procedure Detail:  In the holding area the patient underwent injection of 1 mCi of technetium sulfur colloid intradermally around the left nipple preoperatively. C placement was confirmed with the neoprobe. She was brought to the operating room, placed in the supine position on the operating table, and general endotracheal anesthesia induced. She received preoperative IV antibiotics. PAS replaced. The breast and axilla and upper arm were widely sterilely prepped and draped. Patient timeout was performed and correct procedure verified. The cecum was localized in the upper outer left breast. Patient has morbid obesity with very large breasts. I made an incision overlying the area of high counts and dissection was carried down into the subcutaneous tissue. I then excised a fairly generous amount of tissue surrounding the area of high counts. The seed was fairly deep in the breast. After excision the specimen was inked and specimen x-ray as well as palpation of the specimen indicated the seed and clip were contained within it but the lesion was quite close to the posterior lateral area of the specimen. I therefore excised additional posterior margin and additional lateral margin which was inked and oriented and sent  as permanent section specimen. The lumpectomy cavity was inspected and hemostasis assured. The cavity was marked with clips. The deep breast and subcutaneous tissue was closed with interrupted 3-0 Vicryl. I then localized the hot area in the left axilla with the neoprobe and a transverse incision made. Dissection was carried down through subcutaneous tissue and the clavipectoral fascia with cautery. Using the neoprobe for guidance I dissected down onto several nodes with elevated counts. I was able to find 5 lymph nodes with significantly elevated counts ranging from 2100 to about 200. These were slightly enlarged but not hard. During the dissection to non-hot palpable nodes were also excised. At the completion I did not feel any abnormal nodes in the right significant counts in the axilla. Hemostasis was assured and the deep axillary and subcutaneous tissue closed with interrupted 3-0 Vicryl and the skin with subcuticular 5-0 Monocryl. Both incisions were coated with Liquiban. Sponge needle and instrument counts were correct.    Findings: As above  Estimated Blood Loss:  Minimal         Drains: none  Blood Given: none          Specimens: #1 left breast lumpectomy   #2 further posterior margin    #3 further lateral margin  #4 left axillary sentinel lymph nodes 5    #6 non-hot left axillary lymph nodes 2        Complications:  * No complications entered in OR log *         Disposition: PACU - hemodynamically stable.         Condition: stable

## 2016-04-11 NOTE — Discharge Instructions (Signed)
[facility] °Office Phone Number [phone] ° °BREAST BIOPSY/ PARTIAL MASTECTOMY: POST OP INSTRUCTIONS ° °Always review your discharge instruction sheet given to you by the facility where your surgery was performed. ° °IF YOU HAVE DISABILITY OR FAMILY LEAVE FORMS, YOU MUST BRING THEM TO THE OFFICE FOR PROCESSING.  DO NOT GIVE THEM TO YOUR DOCTOR. ° °1. A prescription for pain medication may be given to you upon discharge.  Take your pain medication as prescribed, if needed.  If narcotic pain medicine is not needed, then you may take acetaminophen (Tylenol) or ibuprofen (Advil) as needed. °2. Take your usually prescribed medications unless otherwise directed °3. If you need a refill on your pain medication, please contact your pharmacy.  They will contact our office to request authorization.  Prescriptions will not be filled after 5pm or on week-ends. °4. You should eat very light the first 24 hours after surgery, such as soup, crackers, pudding, etc.  Resume your normal diet the day after surgery. °5. Most patients will experience some swelling and bruising in the breast.  Ice packs and a good support bra will help.  Swelling and bruising can take several days to resolve.  °6. It is common to experience some constipation if taking pain medication after surgery.  Increasing fluid intake and taking a stool softener will usually help or prevent this problem from occurring.  A mild laxative (Milk of Magnesia or Miralax) should be taken according to package directions if there are no bowel movements after 48 hours. °7. Unless discharge instructions indicate otherwise, you may remove your bandages 24-48 hours after surgery, and you may shower at that time.  You may have steri-strips (small skin tapes) in place directly over the incision.  These strips should be left on the skin for 7-10 days.  If your surgeon used skin glue on the incision, you may shower in 24 hours.  The glue will flake off over the  next 2-3 weeks.  Any sutures or staples will be removed at the office during your follow-up visit. °8. ACTIVITIES:  You may resume regular daily activities (gradually increasing) beginning the next day.  Wearing a good support bra or sports bra minimizes pain and swelling.  You may have sexual intercourse when it is comfortable. °a. You may drive when you no longer are taking prescription pain medication, you can comfortably wear a seatbelt, and you can safely maneuver your car and apply brakes. °b. RETURN TO WORK:  ______________________________________________________________________________________ °9. You should see your doctor in the office for a follow-up appointment approximately two weeks after your surgery.  Your doctor’s nurse will typically make your follow-up appointment when she calls you with your pathology report.  Expect your pathology report 2-3 business days after your surgery.  You may call to check if you do not hear from us after three days. °10. OTHER INSTRUCTIONS: _______________________________________________________________________________________________ _____________________________________________________________________________________________________________________________________ °_____________________________________________________________________________________________________________________________________ °_____________________________________________________________________________________________________________________________________ ° °WHEN TO CALL YOUR DOCTOR: °1. Fever over 101.0 °2. Nausea and/or vomiting. °3. Extreme swelling or bruising. °4. Continued bleeding from incision. °5. Increased pain, redness, or drainage from the incision. ° °The clinic staff is available to answer your questions during regular business hours.  Please don’t hesitate to call and ask to speak to one of the nurses for clinical concerns.  If you have a medical emergency, go to the nearest  emergency room or call 911.  A surgeon from [facility] is always on call at the hospital. ° °For further questions, please visit [email or web address]  °

## 2016-04-11 NOTE — Transfer of Care (Signed)
Immediate Anesthesia Transfer of Care Note  Patient: Sheila Jacobs  Procedure(s) Performed: Procedure(s): BREAST LUMPECTOMY WITH RADIOACTIVE SEED AND SENTINEL LYMPH NODE BIOPSY (Left)  Patient Location: PACU  Anesthesia Type:General  Level of Consciousness: awake and alert   Airway & Oxygen Therapy: Patient Spontanous Breathing and Patient connected to nasal cannula oxygen  Post-op Assessment: Report given to RN and Post -op Vital signs reviewed and stable  Post vital signs: Reviewed and stable  Last Vitals:  Vitals:   04/11/16 1555 04/11/16 1556  BP:  127/66  Pulse:  82  Resp:  (!) 22  Temp: 36.5 C     Last Pain:  Vitals:   04/11/16 1040  TempSrc:   PainSc: 3          Complications: No apparent anesthesia complications

## 2016-04-11 NOTE — Anesthesia Preprocedure Evaluation (Addendum)
Anesthesia Evaluation  Patient identified by MRN, date of birth, ID band Patient awake    Reviewed: Allergy & Precautions, H&P , NPO status , Patient's Chart, lab work & pertinent test results  Airway Mallampati: II   Neck ROM: full    Dental   Pulmonary former smoker,    breath sounds clear to auscultation       Cardiovascular hypertension,  Rhythm:regular Rate:Normal     Neuro/Psych  Headaches, Anxiety    GI/Hepatic GERD  ,  Endo/Other  Morbid obesity  Renal/GU      Musculoskeletal  (+) Arthritis ,   Abdominal   Peds  Hematology   Anesthesia Other Findings   Reproductive/Obstetrics                             Anesthesia Physical Anesthesia Plan  ASA: III  Anesthesia Plan: General   Post-op Pain Management:    Induction: Intravenous  Airway Management Planned: LMA  Additional Equipment:   Intra-op Plan:   Post-operative Plan:   Informed Consent: I have reviewed the patients History and Physical, chart, labs and discussed the procedure including the risks, benefits and alternatives for the proposed anesthesia with the patient or authorized representative who has indicated his/her understanding and acceptance.     Plan Discussed with: CRNA, Anesthesiologist and Surgeon  Anesthesia Plan Comments: (Pt declined nerve block)       Anesthesia Quick Evaluation

## 2016-04-11 NOTE — Anesthesia Postprocedure Evaluation (Signed)
Anesthesia Post Note  Patient: SHAMETRA [facility]  Procedure(s) Performed: Procedure(s) (LRB): BREAST LUMPECTOMY WITH RADIOACTIVE SEED AND SENTINEL LYMPH NODE BIOPSY (Left)  Patient location during evaluation: PACU Anesthesia Type: General Level of consciousness: awake and alert, oriented and patient cooperative Pain management: pain level controlled Vital Signs Assessment: post-procedure vital signs reviewed and stable Respiratory status: spontaneous breathing, nonlabored ventilation and respiratory function stable Cardiovascular status: blood pressure returned to baseline and stable Postop Assessment: no signs of nausea or vomiting Anesthetic complications: no    Last Vitals:  Vitals:   04/11/16 1625 04/11/16 1650  BP: (!) 144/68 131/73  Pulse: 73 63  Resp: 16 11  Temp:  36.7 C    Last Pain:  Vitals:   04/11/16 1625  TempSrc:   PainSc: Asleep                 Kimyah Frein,E. Precilla Purnell

## 2016-04-11 NOTE — Interval H&P Note (Signed)
History and Physical Interval Note:  04/11/2016 1:41 PM  Sheila Jacobs  has presented today for surgery, with the diagnosis of LEFT BREAST CANCER  The various methods of treatment have been discussed with the patient and family. After consideration of risks, benefits and other options for treatment, the patient has consented to  Procedure(s): BREAST LUMPECTOMY WITH RADIOACTIVE SEED AND SENTINEL LYMPH NODE BIOPSY (Left) as a surgical intervention .  The patient's history has been reviewed, patient examined, no change in status, stable for surgery.  I have reviewed the patient's chart and labs.  Questions were answered to the patient's satisfaction.     Ishi Danser T

## 2016-04-11 NOTE — Anesthesia Procedure Notes (Signed)
Procedure Name: LMA Insertion Date/Time: 04/11/2016 2:07 PM Performed by: Eligha Bridegroom Pre-anesthesia Checklist: Patient identified Patient Re-evaluated:Patient Re-evaluated prior to inductionOxygen Delivery Method: Circle system utilized Preoxygenation: Pre-oxygenation with 100% oxygen Intubation Type: IV induction Laryngoscope Size: Mac and 4 Grade View: Grade II Tube type: Oral Tube size: 7.0 mm Number of attempts: 1 Airway Equipment and Method: Stylet Secured at: 22 cm Tube secured with: Tape Dental Injury: Teeth and Oropharynx as per pre-operative assessment

## 2016-04-11 NOTE — Progress Notes (Signed)
Pt c/o chest pain to left chest 3/10, Pt states she has had this pain for a while. Pt unable to describe pain currently, but agrees that it feels like "pressure" when given descriptive words. Pt states she also has had some swelling and pain in her ankles/feet. VSS. Last EKG in October 2016. Dr. Marcie Bal notified and order to do EKG.   pt states pain is just in chest at this time but sometimes moves to shoulder and neck.

## 2016-04-12 ENCOUNTER — Other Ambulatory Visit: Payer: Self-pay

## 2016-04-12 ENCOUNTER — Encounter (HOSPITAL_COMMUNITY): Payer: Self-pay | Admitting: General Surgery

## 2016-04-18 ENCOUNTER — Ambulatory Visit: Payer: Self-pay | Admitting: Hematology and Oncology

## 2016-04-18 ENCOUNTER — Telehealth: Payer: Self-pay | Admitting: Hematology and Oncology

## 2016-04-18 NOTE — Telephone Encounter (Signed)
pt came in wanted to r/s appt-gave pt copy of avs and r/s appt per pt req for am appt

## 2016-04-21 ENCOUNTER — Emergency Department (HOSPITAL_COMMUNITY)
Admission: EM | Admit: 2016-04-21 | Discharge: 2016-04-21 | Disposition: A | Discharge status: Home / Self Care | Payer: Medicare Other | Attending: Emergency Medicine | Admitting: Emergency Medicine

## 2016-04-21 ENCOUNTER — Encounter (HOSPITAL_COMMUNITY): Payer: Self-pay

## 2016-04-21 DIAGNOSIS — T814XXA Infection following a procedure, initial encounter: Secondary | ICD-10-CM | POA: Diagnosis not present

## 2016-04-21 DIAGNOSIS — T888XXA Other specified complications of surgical and medical care, not elsewhere classified, initial encounter: Secondary | ICD-10-CM | POA: Diagnosis not present

## 2016-04-21 DIAGNOSIS — S41102A Unspecified open wound of left upper arm, initial encounter: Secondary | ICD-10-CM | POA: Diagnosis not present

## 2016-04-21 DIAGNOSIS — Z87891 Personal history of nicotine dependence: Secondary | ICD-10-CM | POA: Insufficient documentation

## 2016-04-21 DIAGNOSIS — I10 Essential (primary) hypertension: Secondary | ICD-10-CM | POA: Diagnosis not present

## 2016-04-21 DIAGNOSIS — L089 Local infection of the skin and subcutaneous tissue, unspecified: Secondary | ICD-10-CM

## 2016-04-21 DIAGNOSIS — Y999 Unspecified external cause status: Secondary | ICD-10-CM | POA: Insufficient documentation

## 2016-04-21 DIAGNOSIS — Y628 Failure of sterile precautions during other surgical and medical care: Secondary | ICD-10-CM | POA: Diagnosis not present

## 2016-04-21 DIAGNOSIS — Y939 Activity, unspecified: Secondary | ICD-10-CM | POA: Insufficient documentation

## 2016-04-21 DIAGNOSIS — Y929 Unspecified place or not applicable: Secondary | ICD-10-CM | POA: Insufficient documentation

## 2016-04-21 DIAGNOSIS — Z79899 Other long term (current) drug therapy: Secondary | ICD-10-CM | POA: Insufficient documentation

## 2016-04-21 DIAGNOSIS — Z7982 Long term (current) use of aspirin: Secondary | ICD-10-CM | POA: Insufficient documentation

## 2016-04-21 DIAGNOSIS — Z853 Personal history of malignant neoplasm of breast: Secondary | ICD-10-CM | POA: Diagnosis not present

## 2016-04-21 DIAGNOSIS — T148XXA Other injury of unspecified body region, initial encounter: Secondary | ICD-10-CM

## 2016-04-21 MED ORDER — DOXYCYCLINE HYCLATE 100 MG PO CAPS: 100.0000 mg | ORAL_CAPSULE | Freq: Two times a day (BID) | ORAL | 0 refills | Status: DC

## 2016-04-21 NOTE — ED Provider Notes (Signed)
[facility] DEPT Provider Note   CSN: [id number] Arrival date & time: 04/21/16  1415  First Provider Contact:  First MD Initiated Contact with Patient 04/21/16 1513        History   Chief Complaint Chief Complaint  Patient presents with  . Post-op Problem    HPI Sheila Jacobs is a 65 y.o. female.  Patient indicates on 8/2 had left axillary lymph node removed, and in past day area is sore and draining small amount fluid.  Symptoms persistent/constant. No fever or chills. Does not feel ill/sick. No nv. No trauma to area. States 'had a seed of cancer' in breast, and that is why lymph node procedure/surgery was done.  No current abx tx.    The history is provided by the patient.    Past Medical History:  Diagnosis Date  . Anxiety   . Arthritis   . Breast cancer of upper-outer quadrant of left female breast ([place]) 01/31/2016  . Colon polyps   . Enlarged LA (left atrium)   . GERD (gastroesophageal reflux disease)   . Hard of hearing    Wears hearing aids.  . Headache   . Heavy cigarette smoker    Quit 2015  . Hyperlipidemia   . Hypertension   . Morbid obesity ([place])   . Numbness and tingling   . Obesity   . Thyroid goiter   . Vertigo    on occasion    Patient Active Problem List   Diagnosis Date Noted  . Paresthesia 01/31/2016  . Abnormality of gait 01/31/2016  . Low back pain 01/31/2016  . Breast cancer of upper-outer quadrant of left female breast ([place]) 01/31/2016  . Former heavy cigarette smoker (20-39 per day) 11/08/2015  . Goiter 11/08/2015  . Esophageal reflux   . Special screening for malignant neoplasms, colon   . Hearing loss 10/07/2015  . GERD (gastroesophageal reflux disease) 05/03/2015  . Dependent edema 05/03/2015  . Morbid obesity ([place]) 04/29/2015  . Right carotid bruit 10/06/2014  . Hypokalemia   . Hypomagnesemia   . Atypical chest pain 09/18/2014  . Hypertension 09/18/2014  . Hyperlipidemia 09/18/2014  . Enlarged LA (left atrium)   .  Chest pain, precordial 09/02/2014    Past Surgical History:  Procedure Laterality Date  . BREAST LUMPECTOMY WITH RADIOACTIVE SEED AND SENTINEL LYMPH NODE BIOPSY Left 04/11/2016   Procedure: BREAST LUMPECTOMY WITH RADIOACTIVE SEED AND SENTINEL LYMPH NODE BIOPSY;  Surgeon: Excell Seltzer, MD;  Location: [facility];  Service: General;  Laterality: Left;  . COLONOSCOPY WITH PROPOFOL N/A 10/20/2015   Procedure: COLONOSCOPY WITH PROPOFOL;  Surgeon: Mauri Pole, MD;  Location: [facility];  Service: Endoscopy;  Laterality: N/A;  . ESOPHAGOGASTRODUODENOSCOPY (EGD) WITH PROPOFOL N/A 10/20/2015   Procedure: ESOPHAGOGASTRODUODENOSCOPY (EGD) WITH PROPOFOL;  Surgeon: Mauri Pole, MD;  Location: [facility];  Service: Endoscopy;  Laterality: N/A;  . LEFT HEART CATHETERIZATION WITH CORONARY ANGIOGRAM N/A 10/21/2014   Procedure: LEFT HEART CATHETERIZATION WITH CORONARY ANGIOGRAM;  Surgeon: Burnell Blanks, MD;  Location: [facility] C[facility] LAB;  Service: Cardiovascular;  Laterality: N/A;  . TUBAL LIGATION      OB History    No data available       Home Medications    Prior to Admission medications   Medication Sig Start Date End Date Taking? Authorizing Provider  ALPRAZolam (XANAX) 0.25 MG tablet Take 1 tablet (0.25 mg total) by mouth 2 (two) times daily as needed for anxiety. 01/03/16   Girtha Rm, NP  aspirin EC 81 MG tablet Take 81 mg by mouth daily.    Historical Provider, MD  calcium citrate-vitamin D (CITRACAL+D) 315-200 MG-UNIT per tablet Take 1 tablet by mouth daily.    Historical Provider, MD  hydrochlorothiazide (HYDRODIURIL) 25 MG tablet Take 0.5 tablets (12.5 mg total) by mouth daily. 02/14/16   Camelia Eng Tysinger, PA-C  HYDROcodone-acetaminophen (NORCO/VICODIN) 5-325 MG tablet Take 1-2 tablets by mouth every 4 (four) hours as needed for moderate pain or severe pain. 04/11/16   Excell Seltzer, MD  omeprazole (PRILOSEC) 40 MG capsule Take 1 capsule (40 mg total) by mouth daily. 02/14/16    Camelia Eng Tysinger, PA-C  potassium chloride SA (K-DUR,KLOR-CON) 20 MEQ tablet Take 1 tablet (20 mEq total) by mouth daily. 02/14/16   Carlena Hurl, PA-C    Family History Family History  Problem Relation Age of Onset  . Hypertension Mother   . Heart disease Mother     Pacemaker  . Kidney disease Mother   . Cataracts Mother   . Hearing loss Mother   . Diabetes Mother   . Liver disease Mother   . Diabetes Sister   . Cataracts Brother   . Hearing loss Brother   . Colon cancer Neg Hx   . Thyroid disease Neg Hx     Social History Social History  Substance Use Topics  . Smoking status: Former Smoker    Packs/day: 1.00    Years: 45.00    Types: Cigarettes    Quit date: 07/11/2014  . Smokeless tobacco: Never Used  . Alcohol use No     Allergies   Cortisone   Review of Systems Review of Systems  Constitutional: Negative for chills and fever.  Gastrointestinal: Negative for nausea and vomiting.  Skin: Negative for rash.  Neurological: Negative for weakness and light-headedness.     Physical Exam Updated Vital Signs BP 149/71   Pulse 85   Temp 98.9 F (37.2 C) (Oral)   Resp 16   Ht 5\' 4"  (1.626 m)   Wt 132.9 kg   SpO2 97%   BMI 50.29 kg/m   Physical Exam  Constitutional: She appears well-developed and well-nourished. No distress.  HENT:  Mouth/Throat: Oropharynx is clear and moist.  Eyes: Conjunctivae are normal. No scleral icterus.  Neck: Neck supple. No tracheal deviation present.  Cardiovascular: Normal rate and intact distal pulses.   Pulmonary/Chest: Effort normal. No respiratory distress.  Abdominal: Normal appearance. She exhibits no distension.  Musculoskeletal: She exhibits no edema.  Patients incision to left axilla is open, approximately 3 cm in length, and has drainage small amount serosanguinous material. Mild surrounding erythema ?early cellulitis. No remaining fluctuance/abscess. No crepitus.   Lymphadenopathy:    She has no cervical  adenopathy.  Neurological: She is alert.  Skin: Skin is warm and dry. No rash noted. She is not diaphoretic.  Psychiatric: She has a normal mood and affect.  Nursing note and vitals reviewed.    ED Treatments / Results  Labs (all labs ordered are listed, but only abnormal results are displayed) Labs Reviewed - No data to display  EKG  EKG Interpretation None       Radiology No results found.  Procedures Procedures (including critical care time)  Medications Ordered in ED Medications - No data to display   Initial Impression / Assessment and Plan / ED Course  I have reviewed the triage vital signs and the nursing notes.  Pertinent labs & imaging results that were available during my  care of the patient were reviewed by me and considered in my medical decision making (see chart for details).  Clinical Course    Patient has open wound to left axilla, which has drained. No remaining abscess, seroma, or hematoma.   Moist to dry dressing by nursing.   Giving surrounding erythema, will place on po abx. Confirmed no abx allergies w pt.   Will have her surgeon f/u for wound recheck this week.     Final Clinical Impressions(s) / ED Diagnoses   Final diagnoses:  None    New Prescriptions New Prescriptions   No medications on file     Lajean Saver, MD 04/21/16 1534

## 2016-04-21 NOTE — Discharge Instructions (Signed)
It was our pleasure to provide your ER care today - we hope that you feel better.  Keep the area very clean.   Take tylenol/advil as need.  Take antibiotic as prescribed.   Follow up with your surgeon in the coming week for wound check - call office Monday morning to arrange appointment.   Return to ER if worse, severe pain, spreading redness, high fevers, other concern.

## 2016-04-21 NOTE — ED Triage Notes (Signed)
Patient here to have left axilla checked. Had surgery on 8/2 to have lymph nodes removed and yesterday developed drainage from incision site, denies pain, complains of soreness to same.

## 2016-04-23 ENCOUNTER — Encounter: Payer: Self-pay | Admitting: Hematology and Oncology

## 2016-04-23 ENCOUNTER — Ambulatory Visit (HOSPITAL_BASED_OUTPATIENT_CLINIC_OR_DEPARTMENT_OTHER): Payer: Medicare Other | Admitting: Hematology and Oncology

## 2016-04-23 DIAGNOSIS — Z170 Estrogen receptor positive status [ER+]: Secondary | ICD-10-CM | POA: Diagnosis not present

## 2016-04-23 DIAGNOSIS — C50412 Malignant neoplasm of upper-outer quadrant of left female breast: Secondary | ICD-10-CM | POA: Diagnosis not present

## 2016-04-23 NOTE — Progress Notes (Signed)
Patient Care Team: Girtha Rm, NP as PCP - General Sylvan Cheese, NP as Nurse Practitioner (Hematology and Oncology)  DIAGNOSIS: No matching staging information was found for the patient.  SUMMARY OF ONCOLOGIC HISTORY:   Breast cancer of upper-outer quadrant of left female breast ([place])   01/27/2016 Initial Diagnosis    Screening mammogram: Left breast mass 9 mm, grade 1-2 IDC with DCIS, ER 100%, PR 90%, Ki-67 10%, HER-2 negative ratio 1.5, T1 BN 0 stage IA clinical stage     04/11/2016 Surgery    Left lumpectomy: IDC grade 3, 1.2 cm, IG DCIS, LVI present, 0/8 lymph nodes negative, ER 100%, PR 90%, HER-2 negative, Ki-67 10%, T1 cN0 stage I a pathologic stage      CHIEF COMPLIANT: Follow-up after recent left lumpectomy, complains of open wound in the axilla  INTERVAL HISTORY: Sheila Jacobs is a 65 year old with above-mentioned history of left breast cancer treated with lumpectomy and is here today to discuss the results of the surgery. She complains that the sutures in the axilla have come out and she had gone to the emergency room over the weekend and they have requested but it apparently has come out again. She does have mild to moderate discomfort.  REVIEW OF SYSTEMS:   Constitutional: Denies fevers, chills or abnormal weight loss Eyes: Denies blurriness of vision Ears, nose, mouth, throat, and face: Denies mucositis or sore throat Respiratory: Denies cough, dyspnea or wheezes Cardiovascular: Denies palpitation, chest discomfort Gastrointestinal:  Denies nausea, heartburn or change in bowel habits Skin: Denies abnormal skin rashes Lymphatics: Denies new lymphadenopathy or easy bruising Neurological:Denies numbness, tingling or new weaknesses Behavioral/Psych: Mood is stable, no new changes  Extremities: No lower extremity edema Breast: Recent lumpectomy with axillary dissection for lymph nodes, complains of open wound and axilla All other systems were reviewed  with the patient and are negative.  I have reviewed the past medical history, past surgical history, social history and family history with the patient and they are unchanged from previous note.  ALLERGIES:  is allergic to cortisone.  MEDICATIONS:  Current Outpatient Prescriptions  Medication Sig Dispense Refill  . ALPRAZolam (XANAX) 0.25 MG tablet Take 1 tablet (0.25 mg total) by mouth 2 (two) times daily as needed for anxiety. 60 tablet 0  . aspirin EC 81 MG tablet Take 81 mg by mouth daily.    . calcium citrate-vitamin D (CITRACAL+D) 315-200 MG-UNIT per tablet Take 1 tablet by mouth daily.    Marland Kitchen doxycycline (VIBRAMYCIN) 100 MG capsule Take 1 capsule (100 mg total) by mouth 2 (two) times daily. 14 capsule 0  . hydrochlorothiazide (HYDRODIURIL) 25 MG tablet Take 0.5 tablets (12.5 mg total) by mouth daily. 90 tablet 1  . HYDROcodone-acetaminophen (NORCO/VICODIN) 5-325 MG tablet Take 1-2 tablets by mouth every 4 (four) hours as needed for moderate pain or severe pain. 30 tablet 0  . omeprazole (PRILOSEC) 40 MG capsule Take 1 capsule (40 mg total) by mouth daily. 90 capsule 1  . potassium chloride SA (K-DUR,KLOR-CON) 20 MEQ tablet Take 1 tablet (20 mEq total) by mouth daily. 90 tablet 1   No current facility-administered medications for this visit.     PHYSICAL EXAMINATION: ECOG PERFORMANCE STATUS: 1 - Symptomatic but completely ambulatory  Vitals:   04/23/16 0859  BP: 123/76  Pulse: 78  Resp: 19  Temp: 98.4 F (36.9 C)   Filed Weights   04/23/16 0859  Weight: (!) 301 lb 8 oz (136.8 kg)  GENERAL:alert, no distress and comfortable SKIN: skin color, texture, turgor are normal, no rashes or significant lesions EYES: normal, Conjunctiva are pink and non-injected, sclera clear OROPHARYNX:no exudate, no erythema and lips, buccal mucosa, and tongue normal  NECK: supple, thyroid normal size, non-tender, without nodularity LYMPH:  no palpable lymphadenopathy in the cervical, axillary  or inguinal LUNGS: clear to auscultation and percussion with normal breathing effort HEART: regular rate & rhythm and no murmurs and no lower extremity edema ABDOMEN:abdomen soft, non-tender and normal bowel sounds MUSCULOSKELETAL:no cyanosis of digits and no clubbing  NEURO: alert & oriented x 3 with fluent speech, no focal motor/sensory deficits EXTREMITIES: No lower extremity edema  LABORATORY DATA:  I have reviewed the data as listed   Chemistry      Component Value Date/Time   NA 138 04/10/2016 1016   NA 139 02/08/2016 0850   K 3.6 04/10/2016 1016   K 3.8 02/08/2016 0850   CL 107 04/10/2016 1016   CO2 26 04/10/2016 1016   CO2 25 02/08/2016 0850   BUN 15 04/10/2016 1016   BUN 12.1 02/08/2016 0850   CREATININE 0.80 04/10/2016 1016   CREATININE 0.9 02/08/2016 0850      Component Value Date/Time   CALCIUM 9.7 04/10/2016 1016   CALCIUM 9.7 02/08/2016 0850   ALKPHOS 76 02/08/2016 0850   AST 15 02/08/2016 0850   ALT 11 02/08/2016 0850   BILITOT 0.38 02/08/2016 0850       Lab Results  Component Value Date   WBC 4.0 04/10/2016   HGB 12.2 04/10/2016   HCT 37.9 04/10/2016   MCV 96.2 04/10/2016   PLT 224 04/10/2016   NEUTROABS 2.2 02/08/2016     ASSESSMENT & PLAN:  Breast cancer of upper-outer quadrant of left female breast ([place]) Left lumpectomy 04/11/2016: IDC grade 3, 1.2 cm, IG DCIS, LVI present, 0/8 lymph nodes negative, ER 100%, PR 90%, HER-2 negative, Ki-67 10%, T1 cN0 stage I a pathologic stage  Pathology counseling: I discussed the final pathology report of the patient provided  a copy of this report. I discussed the margins as well as lymph node surgeries. We also discussed the final staging along with previously performed ER/PR and HER-2/neu testing.  Recommendation: 1. Ideally we would like to send Oncotype DX to determine her risk of recurrence and to determine if she would benefit from systemic chemotherapy. After discussion patient is not interested in  chemotherapy options. So we will not send the Oncotype DX. 2. I recommended adjuvant radiation therapy followed by 3. Adjuvant antiestrogen therapy.  Patient has a lot of difficulty understanding the fact that she does have invasive breast cancer. I stressed that this is a curable condition and if appropriate treatment is taken then the risk of recurrence would be significantly reduced. Based on her personal wishes, I am not doing Oncotype DX testing since we will not consider systemic chemotherapy even if the test shows that she was high risk disease.  Open wound and axilla: She is referred to see Dr. Lear Ng office today for a walk-in visit to be checked out.  No orders of the defined types were placed in this encounter.  The patient has a good understanding of the overall plan. she agrees with it. she will call with any problems that may develop before the next visit here.   Rulon Eisenmenger, MD 04/23/16

## 2016-04-23 NOTE — Assessment & Plan Note (Signed)
Left lumpectomy 04/11/2016: IDC grade 3, 1.2 cm, IG DCIS, LVI present, 0/8 lymph nodes negative, ER 100%, PR 90%, HER-2 negative, Ki-67 10%, T1 cN0 stage I a pathologic stage  Pathology counseling: I discussed the final pathology report of the patient provided  a copy of this report. I discussed the margins as well as lymph node surgeries. We also discussed the final staging along with previously performed ER/PR and HER-2/neu testing.  Recommendation: 1. Ideally we would like to send Oncotype DX to determine her risk of recurrence and to determine if she would benefit from systemic chemotherapy. After discussion patient is not interested in chemotherapy options. So we will not send the Oncotype DX. 2. I recommended adjuvant radiation therapy followed by 3. Adjuvant antiestrogen therapy.  Patient has a lot of difficulty understanding and agree to the fact that she does have invasive breast cancer. I stressed that this is a curable condition and if appropriate treatment is taken then the risk of recurrence would be significantly reduced. Based on her personal wishes, I am not doing Oncotype DX testing since we will not consider systemic chemotherapy as a treatment even if the test or to show that she was high risk.

## 2016-04-23 NOTE — Progress Notes (Signed)
Incision in left axilla (approx 1.5 inches, open, small amount of drainage, wound bed pink/red, surrounding site appears healthy, no redness, swelling or heat) cleaned and dressed with 2x2 and tegaderm.  Pt advised that dressing is probably not going to stay due to location of wound and arm movement.  Pt advised to call the surgeon today to have incision stitched.    Dr Hoxworth's office notified.

## 2016-04-24 ENCOUNTER — Encounter: Payer: Self-pay | Admitting: Neurology

## 2016-04-24 ENCOUNTER — Ambulatory Visit (INDEPENDENT_AMBULATORY_CARE_PROVIDER_SITE_OTHER): Payer: Medicare Other | Admitting: Neurology

## 2016-04-24 VITALS — BP 123/71 | HR 80 | Ht 64.0 in | Wt 297.0 lb

## 2016-04-24 DIAGNOSIS — R269 Unspecified abnormalities of gait and mobility: Secondary | ICD-10-CM

## 2016-04-24 DIAGNOSIS — R7309 Other abnormal glucose: Secondary | ICD-10-CM

## 2016-04-24 DIAGNOSIS — R202 Paresthesia of skin: Secondary | ICD-10-CM | POA: Diagnosis not present

## 2016-04-24 NOTE — Progress Notes (Signed)
PATIENT: Sheila Jacobs DOB: 23-Jul-1951  Chief Complaint  Patient presents with  . Numbness    She is still having intermittent numbness/tingling in her bilateral arm/hand (right side worse than left) and right leg.  She would like to futher review her test results (MRI, NCV/EMG).     HISTORICAL  Sheila Jacobs is a 65 years old right-handed female, seen in refer by her primary care physician NP Girtha Rm for evaluation of right side numbness in Jan 31 2016.  She has history of hypertension, obesity, anxiety She noticed right side numbness since 2016, she could not elaborate on the onset of her symptoms, she noticed intermittent numbness involving her right arm, right leg, sparing her face, numbness stay with her for few minutes, she also felt subjective weakness, she drinks onion soup to make her symptoms go away, she has mild gait difficulty due to opaque body habitus, joints pain,  She has chronic low back pain, radiating pain to left leg, she denies bowel and bladder incontinence  UPDATE August 15th 2017: Today she complains of frequent chest pain, reported multiple EKG in the past showed no significant abnormality,  She also complains of dizziness, especially when first woke up,  I have personally reviewed MRI of the brain without contrast in July 2017, mild supratentorium small vessel disease, she is already taking aspirin 81 mg daily,  EMG nerve conduction study in June 2017 showed mild axonal peripheral neuropathy, have reviewed laboratory evaluations, low normal vitamin B12 279, advised her to take B12 supplement 1000 g daily, CMP showed mild elevated glucose 122, will check A1c today, normal TSH, CBC,  REVIEW OF SYSTEMS: Full 14 system review of systems performed and notable only for Headache, fatigue, excessive eating, hearing loss   ALLERGIES: Allergies  Allergen Reactions  . Cortisone Rash    HOME MEDICATIONS: Current Outpatient Prescriptions    Medication Sig Dispense Refill  . ALPRAZolam (XANAX) 0.25 MG tablet Take 1 tablet (0.25 mg total) by mouth 2 (two) times daily as needed for anxiety. 60 tablet 0  . aspirin EC 81 MG tablet Take 81 mg by mouth daily.    . calcium citrate-vitamin D (CITRACAL+D) 315-200 MG-UNIT per tablet Take 1 tablet by mouth daily.    . hydrochlorothiazide (HYDRODIURIL) 25 MG tablet Take 0.5 tablets (12.5 mg total) by mouth daily. 90 tablet 1  . HYDROcodone-acetaminophen (NORCO/VICODIN) 5-325 MG tablet Take 1-2 tablets by mouth every 4 (four) hours as needed for moderate pain or severe pain. 30 tablet 0  . omeprazole (PRILOSEC) 40 MG capsule Take 1 capsule (40 mg total) by mouth daily. 90 capsule 1  . potassium chloride SA (K-DUR,KLOR-CON) 20 MEQ tablet Take 1 tablet (20 mEq total) by mouth daily. 90 tablet 1   No current facility-administered medications for this visit.     PAST MEDICAL HISTORY: Past Medical History:  Diagnosis Date  . Anxiety   . Arthritis   . Breast cancer of upper-outer quadrant of left female breast ([place]) 01/31/2016  . Colon polyps   . Enlarged LA (left atrium)   . GERD (gastroesophageal reflux disease)   . Hard of hearing    Wears hearing aids.  . Headache   . Heavy cigarette smoker    Quit 2015  . Hyperlipidemia   . Hypertension   . Morbid obesity ([place])   . Numbness and tingling   . Obesity   . Thyroid goiter   . Vertigo    on occasion  PAST SURGICAL HISTORY: Past Surgical History:  Procedure Laterality Date  . BREAST LUMPECTOMY WITH RADIOACTIVE SEED AND SENTINEL LYMPH NODE BIOPSY Left 04/11/2016   Procedure: BREAST LUMPECTOMY WITH RADIOACTIVE SEED AND SENTINEL LYMPH NODE BIOPSY;  Surgeon: Excell Seltzer, MD;  Location: [facility];  Service: General;  Laterality: Left;  . COLONOSCOPY WITH PROPOFOL N/A 10/20/2015   Procedure: COLONOSCOPY WITH PROPOFOL;  Surgeon: Mauri Pole, MD;  Location: [facility];  Service: Endoscopy;  Laterality: N/A;  .  ESOPHAGOGASTRODUODENOSCOPY (EGD) WITH PROPOFOL N/A 10/20/2015   Procedure: ESOPHAGOGASTRODUODENOSCOPY (EGD) WITH PROPOFOL;  Surgeon: Mauri Pole, MD;  Location: [facility];  Service: Endoscopy;  Laterality: N/A;  . LEFT HEART CATHETERIZATION WITH CORONARY ANGIOGRAM N/A 10/21/2014   Procedure: LEFT HEART CATHETERIZATION WITH CORONARY ANGIOGRAM;  Surgeon: Burnell Blanks, MD;  Location: [facility];  Service: Cardiovascular;  Laterality: N/A;  . TUBAL LIGATION      FAMILY HISTORY: Family History  Problem Relation Age of Onset  . Hypertension Mother   . Heart disease Mother     Pacemaker  . Kidney disease Mother   . Cataracts Mother   . Hearing loss Mother   . Diabetes Mother   . Liver disease Mother   . Diabetes Sister   . Cataracts Brother   . Hearing loss Brother   . Colon cancer Neg Hx   . Thyroid disease Neg Hx     SOCIAL HISTORY:  Social History   Social History  . Marital status: Single    Spouse name: N/A  . Number of children: 1  . Years of education: 20   Occupational History  . Retired    Social History Main Topics  . Smoking status: Former Smoker    Packs/day: 1.00    Years: 45.00    Types: Cigarettes    Quit date: 07/11/2014  . Smokeless tobacco: Never Used  . Alcohol use No  . Drug use: No  . Sexual activity: Not on file   Other Topics Concern  . Not on file   Social History Narrative   Lives with daughter.    Right-hand.   Occasional use of caffeine.      PHYSICAL EXAM   Vitals:   04/24/16 1013  BP: 123/71  Pulse: 80  Weight: 297 lb (134.7 kg)  Height: 5\' 4"  (1.626 m)    Not recorded      Body mass index is 50.98 kg/m.  PHYSICAL EXAMNIATION:  Gen: NAD, conversant, well nourised, obese, well groomed                     Cardiovascular: Regular rate rhythm, no peripheral edema, warm, nontender. Eyes: Conjunctivae clear without exudates or hemorrhage Neck: Supple, no carotid bruise. Pulmonary: Clear to auscultation  bilaterally   NEUROLOGICAL EXAM:  MENTAL STATUS: Speech:    Speech is normal; fluent and spontaneous with normal comprehension.  Cognition:     Orientation to time, place and person     Normal recent and remote memory     Normal Attention span and concentration     Normal Language, naming, repeating,spontaneous speech     Fund of knowledge   CRANIAL NERVES: CN II: Visual fields are full to confrontation. Fundoscopic exam is normal with sharp discs and no vascular changes. Pupils are round equal and briskly reactive to light. CN III, IV, VI: extraocular movement are normal. No ptosis. CN V: Facial sensation is intact to pinprick in all 3 divisions bilaterally. Corneal responses  are intact.  CN VII: Face is symmetric with normal eye closure and smile. CN VIII: Hearing is normal to rubbing fingers CN IX, X: Palate elevates symmetrically. Phonation is normal. CN XI: Head turning and shoulder shrug are intact CN XII: Tongue is midline with normal movements and no atrophy.  MOTOR: There is no pronator drift of out-stretched arms. Muscle bulk and tone are normal. Muscle strength is normal.  REFLEXES: Reflexes are 2+ and symmetric at the biceps, triceps, knees, and ankles. Plantar responses are flexor.  SENSORY: Intact to light touch, pinprick, positional sensation and vibratory sensation are intact in fingers and toes.  COORDINATION: Rapid alternating movements and fine finger movements are intact. There is no dysmetria on finger-to-nose and heel-knee-shin.    GAIT/STANCE: Need pushing up to get up from seated position, wide based, mildly unsteady, is also limited by her big body habitus  DIAGNOSTIC DATA (LABS, IMAGING, TESTING) - I reviewed patient records, labs, notes, testing and imaging myself where available.   ASSESSMENT AND PLAN  KELLSEY THEDE is a 65 y.o. female    Abnormal MRI of the brain  Showed evidence of small vessel disease, she has vascular risk factor of  age, obesity, sedentary lifestyle, hypertension, mild abnormal glucose   I advised her to keep aspirin 81 mg daily  Continue moderate exercise,   Bilateral lower extremity paresthesia, evidence of mild axonal peripheral neuropathy   laboratory evaluation to rule out treatable cause,  Low-normal vitamin B12, I have advised her continue over-the-counter vitamin B12 supplement 1000 g daily  Marcial Pacas, M.D. Ph.D.  [facility] [address] [place], [place] [address] Ph: [phone] Fax: [phone]  CC: Girtha Rm, NP

## 2016-04-25 ENCOUNTER — Ambulatory Visit: Payer: Medicare Other

## 2016-04-25 ENCOUNTER — Ambulatory Visit
Admission: RE | Admit: 2016-04-25 | Discharge: 2016-04-25 | Disposition: A | Discharge status: Home / Self Care | Payer: Medicare Other | Source: Ambulatory Visit | Attending: Radiation Oncology | Admitting: Radiation Oncology

## 2016-04-25 DIAGNOSIS — Z170 Estrogen receptor positive status [ER+]: Secondary | ICD-10-CM | POA: Insufficient documentation

## 2016-04-25 DIAGNOSIS — C50412 Malignant neoplasm of upper-outer quadrant of left female breast: Secondary | ICD-10-CM | POA: Insufficient documentation

## 2016-04-25 DIAGNOSIS — Z510 Encounter for antineoplastic radiation therapy: Secondary | ICD-10-CM | POA: Insufficient documentation

## 2016-04-25 LAB — HEMOGLOBIN A1C
ESTIMATED AVERAGE GLUCOSE: 105 mg/dL
HEMOGLOBIN A1C: 5.3 % (ref 4.8–5.6)

## 2016-04-27 NOTE — Progress Notes (Signed)
error 

## 2016-05-07 ENCOUNTER — Telehealth: Payer: Self-pay | Admitting: Neurology

## 2016-05-07 ENCOUNTER — Ambulatory Visit
Admission: RE | Admit: 2016-05-07 | Discharge: 2016-05-07 | Disposition: A | Discharge status: Home / Self Care | Payer: Medicare Other | Source: Ambulatory Visit | Attending: Radiation Oncology | Admitting: Radiation Oncology

## 2016-05-07 ENCOUNTER — Ambulatory Visit: Payer: Medicare Other

## 2016-05-07 NOTE — Telephone Encounter (Signed)
Patient returned call

## 2016-05-08 NOTE — Telephone Encounter (Signed)
She has pending labs that need to be complete.  Left message for her to come by our office for these additional labs to be drawn.  Provided our office hours and lab lunch hours.  Left our number to call back with any questions.

## 2016-05-09 ENCOUNTER — Ambulatory Visit (INDEPENDENT_AMBULATORY_CARE_PROVIDER_SITE_OTHER): Payer: Medicare Other | Admitting: Family Medicine

## 2016-05-09 ENCOUNTER — Encounter: Payer: Self-pay | Admitting: Family Medicine

## 2016-05-09 VITALS — BP 128/80 | HR 65 | Temp 98.0°F | Wt 293.2 lb

## 2016-05-09 DIAGNOSIS — M79661 Pain in right lower leg: Secondary | ICD-10-CM

## 2016-05-09 DIAGNOSIS — F411 Generalized anxiety disorder: Secondary | ICD-10-CM | POA: Diagnosis not present

## 2016-05-09 DIAGNOSIS — E049 Nontoxic goiter, unspecified: Secondary | ICD-10-CM

## 2016-05-09 DIAGNOSIS — R7989 Other specified abnormal findings of blood chemistry: Secondary | ICD-10-CM | POA: Insufficient documentation

## 2016-05-09 DIAGNOSIS — H9193 Unspecified hearing loss, bilateral: Secondary | ICD-10-CM

## 2016-05-09 LAB — TSH: TSH: 0.25 m[IU]/L — AB

## 2016-05-09 LAB — T4, FREE: Free T4: 1.2 ng/dL (ref 0.8–1.8)

## 2016-05-09 NOTE — Progress Notes (Signed)
Subjective:    Patient ID: Sheila Jacobs, female    DOB: Aug 20, 1951, 65 y.o.   MRN: [id number]  HPI Chief Complaint  Patient presents with  . Other    stomach pain, and right knee pain-    She is here with complaints of right calf pain, swelling and tenderness, intermittent,  for the past year.  States pain and swelling to right calf seem to be getting worse. Repeating "I need an XR and if you can't order one then I will have to go to the ER to get one". Pain is not necessarily worse or improved with activity.  Has tried Bengay and Tylenol but states nothing is helping any longer.  Patient does have significant risk factors for DVT. She is morbidly obese, sedentary, breast cancer diagnosis and recent surgery. Had a lumpectomy of left breast and is soon starting chemotherapy. Denies history of blood clot.   States she hears a "noise like somebody is beating inside my chest". States she must have a "bad nerve somewhere that is not showing up". Has been worked up and released by cardiologist in past year or so. Pain is unchanged per patient.  States abdominal pain is diffuse, intermittent and has not been present for several days.  Poor historian and difficult to follow. Vague symptoms and jumps from one complaint to the next.   States she has 2 new hearing aids but she needs to take them in to have them adjusted.   Denies fever, chills, headache, dizziness, neck pain, visual changes, palpitations, shortness of breath, cough, GI or GU issues. Denies numbness, tingling or weakness.   Review of Systems Pertinent positives and negatives in the history of present illness.     Objective:   Physical Exam  Constitutional: She is oriented to person, place, and time. She appears well-developed and well-nourished. No distress.  HENT:  Right Ear: Decreased hearing is noted.  Left Ear: Decreased hearing is noted.  Mouth/Throat: Uvula is midline, oropharynx is clear and moist and mucous  membranes are normal.  Hearing aid to left ear. Decreased hearing to bilateral ears, unchanged.   Neck: Neck supple. Thyromegaly present.  Cardiovascular: Normal rate, regular rhythm, normal heart sounds and intact distal pulses.  Exam reveals no gallop and no friction rub.   No murmur heard. Pulmonary/Chest: Effort normal and breath sounds normal.  Abdominal: Soft. Bowel sounds are normal. She exhibits no mass. There is no tenderness. There is no rebound, no guarding and no CVA tenderness.  Difficult to exam due to body habitus.   Musculoskeletal:       Right lower leg: She exhibits tenderness and edema.  Right calf with tenderness and edema, non pitting, no redness, warmth. Normal capillary refill, pulses palpable, ROM and strength normal.  Negative Homans'   Lymphadenopathy:    She has no cervical adenopathy.  Neurological: She is alert and oriented to person, place, and time. No cranial nerve deficit or sensory deficit.  Skin: Skin is warm and dry. No rash noted. No pallor.  Psychiatric: Her speech is normal and behavior is normal. Thought content normal. Her mood appears anxious.   BP 128/80   Pulse 65   Temp 98 F (36.7 C) (Oral)   Wt 293 lb 3.2 oz (133 kg)   BMI 50.33 kg/m        Assessment & Plan:  Calf pain, right - Plan: US Venous Img Lower Unilateral Right  Goiter - Plan: TSH, T4, Free, T3  Generalized  anxiety disorder - Plan: TSH, T4, Free, T3  Abnormal TSH  Hearing loss, bilateral  Patient is under the care of multiple specialists for various illnesses.  Chronic atypical chest pain is unchanged per patient. Reviewed Dr. [name] notes regarding normal cardiac work up.  Difficult to thoroughly exam right calf for edema due to body habitus. Plan to send for [place] to rule out DVT.  Will repeat thyroid panel due to goiter and have her follow up with Dr. Dwyane Dee as recommended.  She has Xanax to take prn for anxiety at home.  Recommend she take her new hearing aids in  to have them activated.  Follow up pending labs and [place]

## 2016-05-10 ENCOUNTER — Ambulatory Visit
Admission: RE | Admit: 2016-05-10 | Discharge: 2016-05-10 | Disposition: A | Discharge status: Home / Self Care | Payer: Medicare Other | Source: Ambulatory Visit | Attending: Family Medicine | Admitting: Family Medicine

## 2016-05-10 ENCOUNTER — Ambulatory Visit: Payer: Self-pay | Admitting: Endocrinology

## 2016-05-10 DIAGNOSIS — R600 Localized edema: Secondary | ICD-10-CM | POA: Diagnosis not present

## 2016-05-10 DIAGNOSIS — M79661 Pain in right lower leg: Secondary | ICD-10-CM

## 2016-05-10 DIAGNOSIS — Z0289 Encounter for other administrative examinations: Secondary | ICD-10-CM

## 2016-05-10 LAB — T3: T3 TOTAL: 128 ng/dL (ref 76–181)

## 2016-05-10 IMAGING — US US EXTREM LOW VENOUS*R*
1 series · 13 of 24 positions shown · non-contrast
Comparison: None.

CLINICAL DATA: Right lower extremity edema and pain for over 1
year.



[Series 1: us extrem low venous*right* · 13 of 35 slices shown]
[im 1/35]
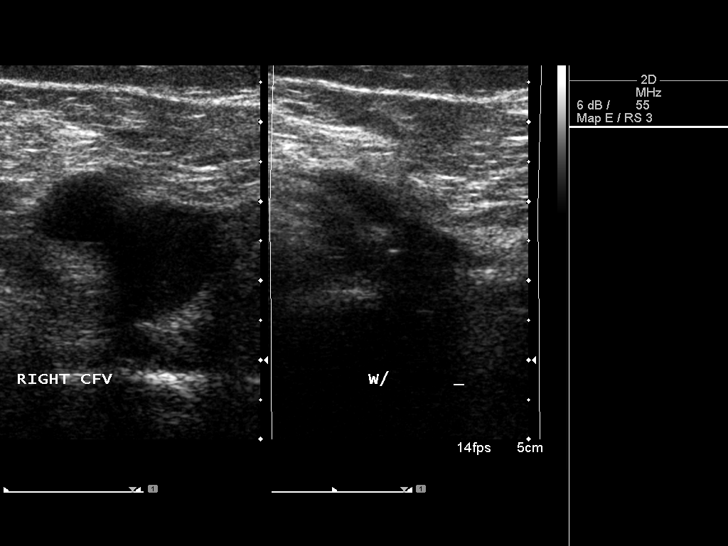
[im 3/35]
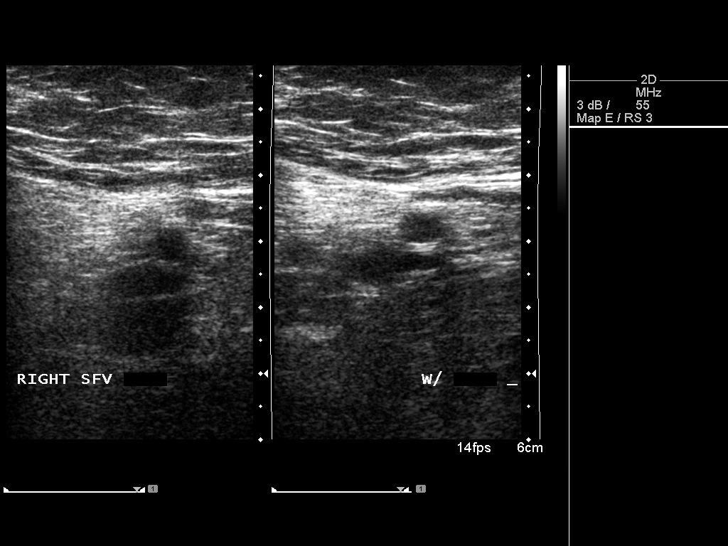
[im 6/35]
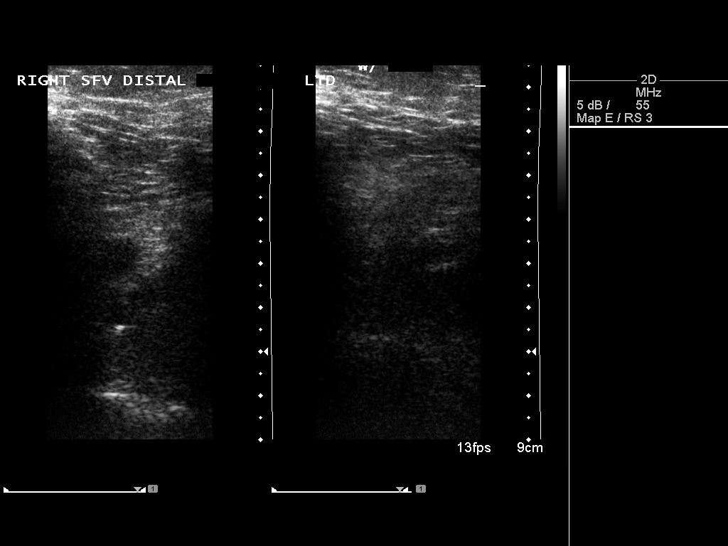
[im 9/35]
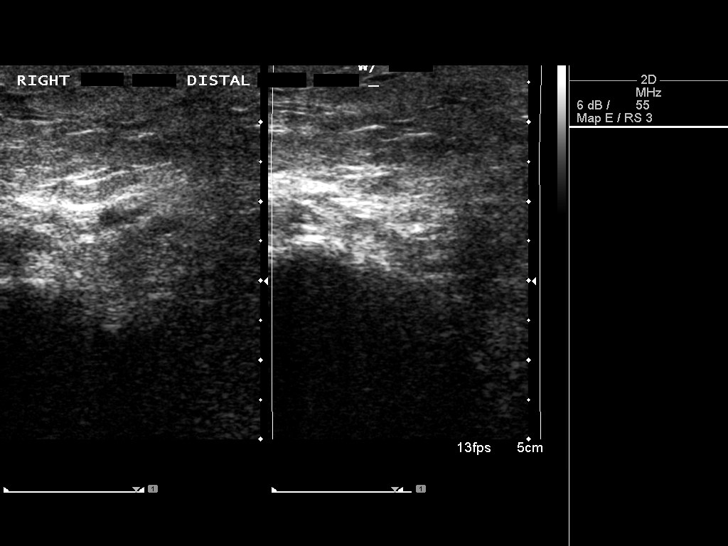
[im 12/35]
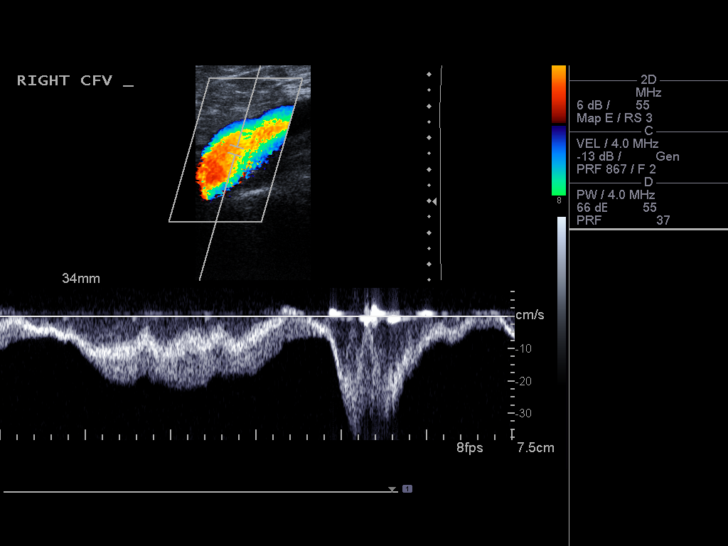
[im 15/35]
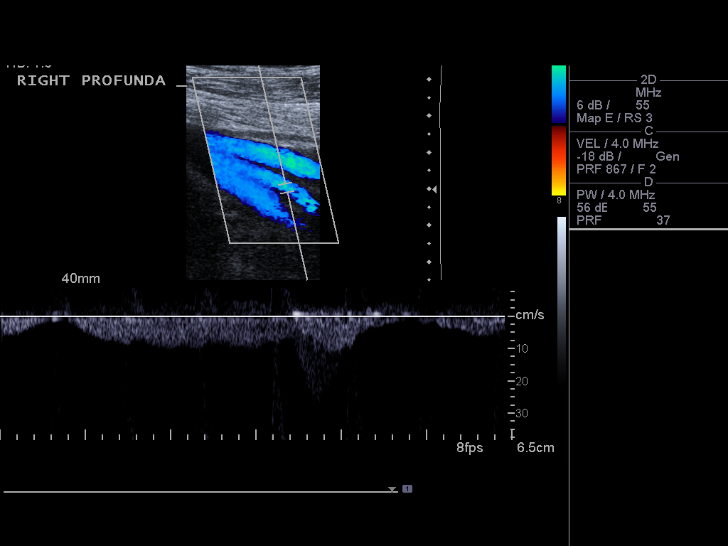
[im 18/35]
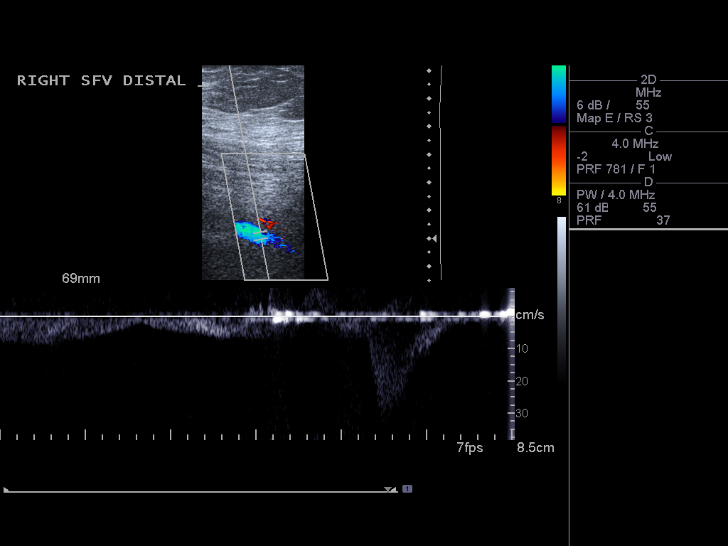
[im 20/35]
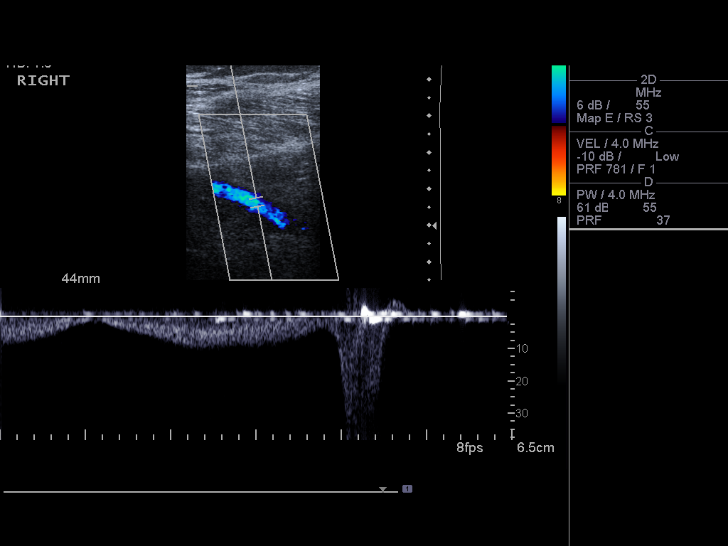
[im 23/35]
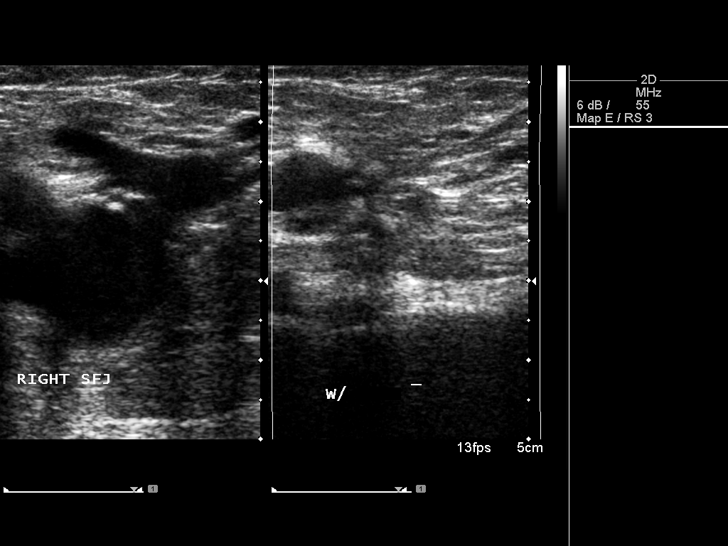
[im 26/35]
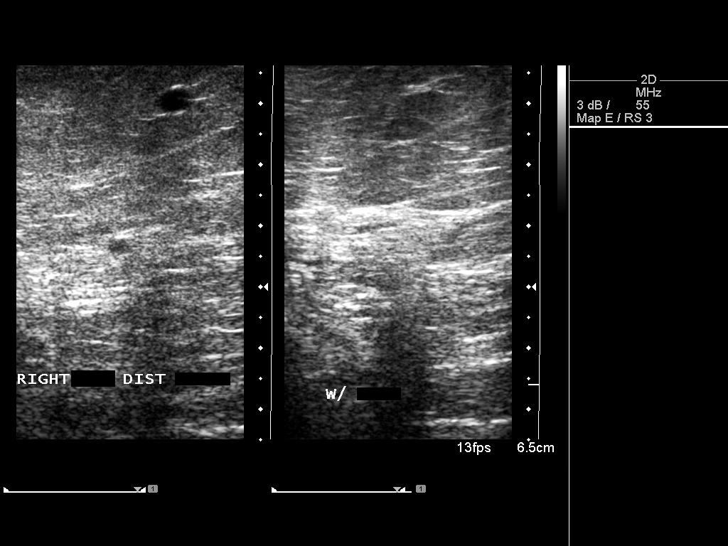
[im 29/35]
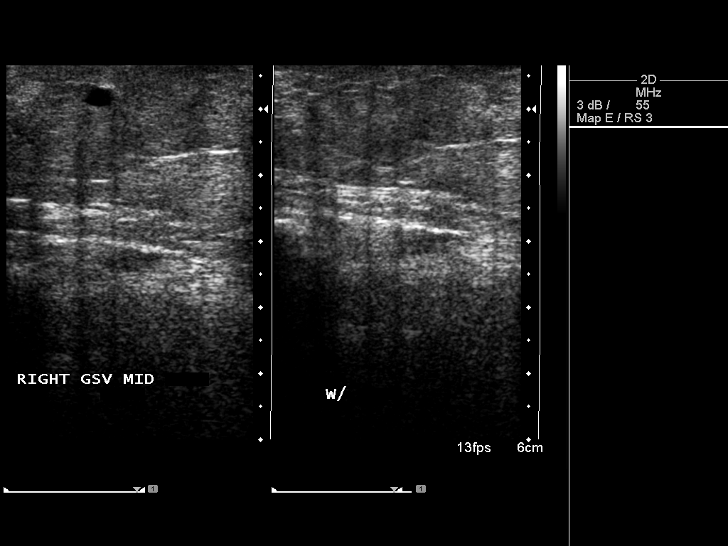
[im 32/35]
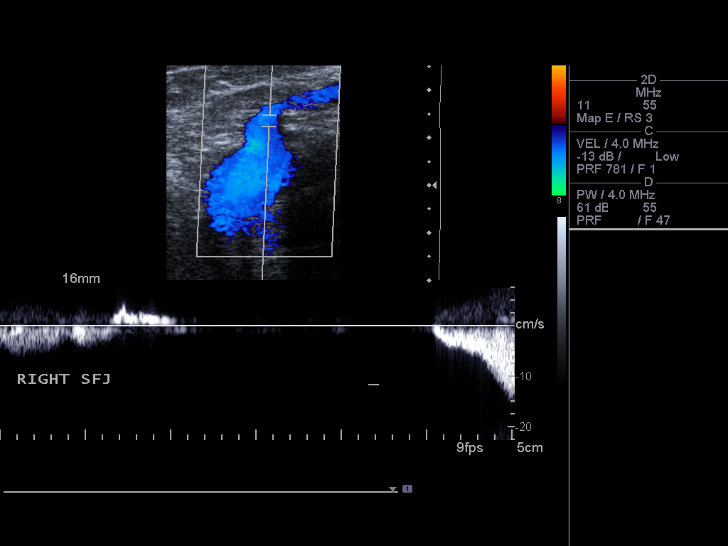
[im 35/35]
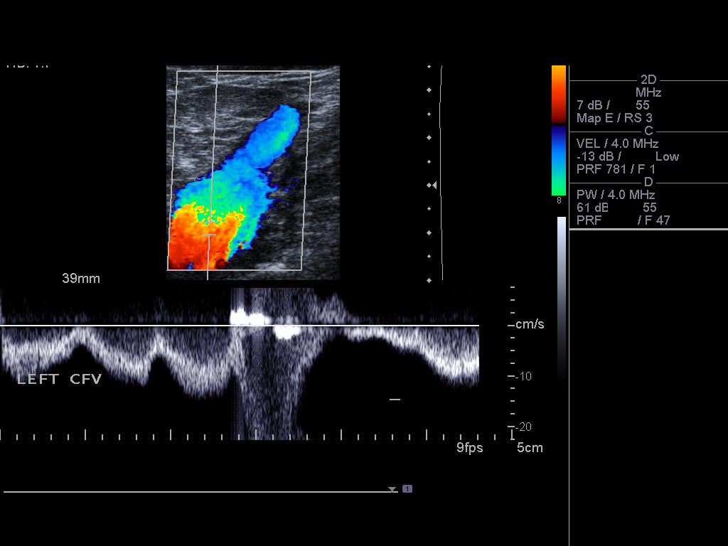

[13 of 24 positions shown; findings below may reference images not displayed]

FINDINGS: Limited exam because of patient obese body habitus.

Contralateral Common Femoral Vein: Respiratory phasicity is normal
and symmetric with the symptomatic side. No evidence of thrombus.
Normal compressibility.

Common Femoral Vein: No evidence of thrombus. Normal
compressibility, respiratory phasicity and response to augmentation.

Saphenofemoral Junction: No evidence of thrombus. Normal
compressibility and flow on color Doppler imaging.

Profunda Femoral Vein: No evidence of thrombus. Normal
compressibility and flow on color Doppler imaging.

Femoral Vein: No evidence of thrombus. Normal compressibility,
respiratory phasicity and response to augmentation.

Popliteal Vein: No evidence of thrombus. Normal compressibility,
respiratory phasicity and response to augmentation.

Calf Veins: No evidence of thrombus. Normal compressibility and flow
on color Doppler imaging.

Superficial Great Saphenous Vein: No evidence of thrombus. Normal
compressibility and flow on color Doppler imaging.

Venous Reflux:  None.

Other Findings:  None.
IMPRESSION: No evidence of significant occlusive right lower extremity deep
venous thrombosis.

## 2016-05-10 NOTE — Progress Notes (Signed)
Location of Breast Cancer: Left Breast  Histology per Pathology Report:  01/27/16 Diagnosis Breast, left, needle core biopsy, 12:30 o'clock - INVASIVE DUCTAL CARCINOMA, SEE COMMENT.  Receptor Status: ER(100%), PR (90%), Her2-neu (NEG), Ki-(10%)  Did patient present with symptoms or was this found on screening mammography?: It was found on a screening mammogram.   Past/Anticipated interventions by surgeon, if any: 04/11/16 Procedure: Procedure(s): BREAST LUMPECTOMY WITH RADIOACTIVE SEED AND SENTINEL LYMPH NODE BIOPSY  Surgeon: Excell Seltzer T   Past/Anticipated interventions by medical oncology, if any:  02/08/16 Dr. Lindi Adie Recommendations: 1. Breast conserving surgery followed by 2. Oncotype DX testing to determine if chemotherapy would be of any benefit followed by 3. Adjuvant radiation therapy (patient might be a partial breast irradiation candidate) 4. Adjuvant antiestrogen therapy  04/23/16 Dr. Lindi Adie Recommendation: 1. Ideally we would like to send Oncotype DX to determine her risk of recurrence and to determine if she would benefit from systemic chemotherapy. After discussion patient is not interested in chemotherapy options. So we will not send the Oncotype DX. 2. I recommended adjuvant radiation therapy followed by 3. Adjuvant antiestrogen therapy.  Patient has a lot of difficulty understanding the fact that she does have invasive breast cancer. I stressed that this is a curable condition and if appropriate treatment is taken then the risk of recurrence would be significantly reduced. Based on her personal wishes, I am not doing Oncotype DX testing since we will not consider systemic chemotherapy even if the test shows that she was high risk disease.  Open wound and axilla: She is referred to see Dr. Lear Ng office today for a walk-in visit to be checked out.  Lymphedema issues, if any:   No  Pain issues, if any:  She denies pain, but does report itching to her  Left nipple and occasional sharp pains to her Left Breast  SAFETY ISSUES:  Prior radiation? No  Pacemaker/ICD? No  Possible current pregnancy? No  Is the patient on methotrexate? No  Current Complaints / other details:   BP (!) 145/64   Pulse 70   Temp 98.1 F (36.7 C)   Ht _0  (1.626 m)   Wt 294 lb 14.4 oz (133.8 kg)   SpO2 98% Comment: room air  BMI 50.62 kg/m    Wt Readings from Last 3 Encounters:  05/16/16 294 lb 14.4 oz (133.8 kg)  05/09/16 293 lb 3.2 oz (133 kg)  04/24/16 297 lb (134.7 kg)       Naziyah Tieszen, Stephani Police, RN 05/10/2016,9:14 AM

## 2016-05-11 ENCOUNTER — Encounter: Payer: Self-pay | Admitting: Internal Medicine

## 2016-05-16 ENCOUNTER — Other Ambulatory Visit: Payer: Self-pay | Admitting: Radiation Oncology

## 2016-05-16 ENCOUNTER — Ambulatory Visit
Admission: RE | Admit: 2016-05-16 | Discharge: 2016-05-16 | Disposition: A | Discharge status: Home / Self Care | Payer: Medicare Other | Source: Ambulatory Visit | Attending: Radiation Oncology | Admitting: Radiation Oncology

## 2016-05-16 ENCOUNTER — Encounter: Payer: Self-pay | Admitting: Radiation Oncology

## 2016-05-16 DIAGNOSIS — C50412 Malignant neoplasm of upper-outer quadrant of left female breast: Secondary | ICD-10-CM

## 2016-05-16 DIAGNOSIS — Z170 Estrogen receptor positive status [ER+]: Secondary | ICD-10-CM | POA: Diagnosis not present

## 2016-05-16 DIAGNOSIS — Z0100 Encounter for examination of eyes and vision without abnormal findings: Secondary | ICD-10-CM | POA: Diagnosis not present

## 2016-05-16 DIAGNOSIS — Z510 Encounter for antineoplastic radiation therapy: Secondary | ICD-10-CM | POA: Diagnosis not present

## 2016-05-16 DIAGNOSIS — H2513 Age-related nuclear cataract, bilateral: Secondary | ICD-10-CM | POA: Diagnosis not present

## 2016-05-16 NOTE — Progress Notes (Signed)
Radiation Oncology         [phone] ________________________________  Name: Sheila Jacobs MRN: [id number]  Date: 05/16/2016  DOB: 21-Aug-1951  Follow-Up Visit Note  Outpatient  CC: Harland Dingwall, NP  Excell Seltzer, MD  Diagnosis:      ICD-9-CM ICD-10-CM   1. Breast cancer of upper-outer quadrant of left female breast ([facility]) 174.4 C50.412    Stage IA [id number] left breast invasive ductal carcinoma ER / PR + HER2 neg, Grade 3  Narrative:  The patient returns today for follow-up.    Since consultation with Dr Pablo Ledger, she underwent left lumpectomy and sentinel lymph node biopsy by Dr. Excell Seltzer on 04/11/16. This revealed a  1.2 cm tumor with negative margins grade 3 ER, PR +, HER 2 - several sentinel nodes were removed and all were negative. Eight total lymph nodes were negative. Dr. Lindi Adie did not send Oncotype DX because patient is not interested in chemotherapy. Patient denies pain, she denies lymphedema.  Patient denies any pain. She notes itching to her left nipple and occasional sharp pains to her left breast. She denies lymphedema issues.            ALLERGIES:  is allergic to cortisone.  Meds: Current Outpatient Prescriptions  Medication Sig Dispense Refill  . ALPRAZolam (XANAX) 0.25 MG tablet Take 1 tablet (0.25 mg total) by mouth 2 (two) times daily as needed for anxiety. 60 tablet 0  . aspirin EC 81 MG tablet Take 81 mg by mouth daily.    . calcium citrate-vitamin D (CITRACAL+D) 315-200 MG-UNIT per tablet Take 1 tablet by mouth daily.    . hydrochlorothiazide (HYDRODIURIL) 25 MG tablet Take 0.5 tablets (12.5 mg total) by mouth daily. 90 tablet 1  . omeprazole (PRILOSEC) 40 MG capsule Take 1 capsule (40 mg total) by mouth daily. 90 capsule 1  . potassium chloride SA (K-DUR,KLOR-CON) 20 MEQ tablet Take 1 tablet (20 mEq total) by mouth daily. 90 tablet 1  . HYDROcodone-acetaminophen (NORCO/VICODIN) 5-325 MG tablet Take 1-2 tablets by mouth every 4 (four) hours as needed  for moderate pain or severe pain. (Patient not taking: Reported on 05/16/2016) 30 tablet 0   No current facility-administered medications for this encounter.     Physical Findings:  height is '5\' 4"'$  (1.626 m) and weight is 294 lb 14.4 oz (133.8 kg). Her temperature is 98.1 F (36.7 C). Her blood pressure is 145/64 (abnormal) and her pulse is 70. Her oxygen saturation is 98%. .    Breast exam: reveals patients axillary and lumpectomy scars have healed well with no drainage.  She has very large breasts. Patient has a seroma palpable under left lumpectomy scar. Skin:  No skin rashes over IM folds Neck: Patient has a right lower neck mass which she reports is a known goiter which is being followed Lymphatics: no lymphedema in upper extremities Psych: she is tearful at times.  Limited eye contact.   Lab Findings: Lab Results  Component Value Date   WBC 4.0 04/10/2016   HGB 12.2 04/10/2016   HCT 37.9 04/10/2016   MCV 96.2 04/10/2016   PLT 224 04/10/2016       Radiographic Findings: US Venous Img Lower Unilateral Right  Result Date: 05/10/2016 CLINICAL DATA:  Right lower extremity edema and pain for over 1 year. EXAM: RIGHT LOWER EXTREMITY VENOUS DOPPLER ULTRASOUND TECHNIQUE: Gray-scale sonography with graded compression, as well as color Doppler and duplex ultrasound were performed to evaluate the lower extremity deep venous systems from the  level of the common femoral vein and including the common femoral, femoral, profunda femoral, popliteal and calf veins including the posterior tibial, peroneal and gastrocnemius veins when visible. The superficial great saphenous vein was also interrogated. Spectral Doppler was utilized to evaluate flow at rest and with distal augmentation maneuvers in the common femoral, femoral and popliteal veins. COMPARISON:  None. FINDINGS: Limited exam because of patient obese body habitus. Contralateral Common Femoral Vein: Respiratory phasicity is normal and  symmetric with the symptomatic side. No evidence of thrombus. Normal compressibility. Common Femoral Vein: No evidence of thrombus. Normal compressibility, respiratory phasicity and response to augmentation. Saphenofemoral Junction: No evidence of thrombus. Normal compressibility and flow on color Doppler imaging. Profunda Femoral Vein: No evidence of thrombus. Normal compressibility and flow on color Doppler imaging. Femoral Vein: No evidence of thrombus. Normal compressibility, respiratory phasicity and response to augmentation. Popliteal Vein: No evidence of thrombus. Normal compressibility, respiratory phasicity and response to augmentation. Calf Veins: No evidence of thrombus. Normal compressibility and flow on color Doppler imaging. Superficial Great Saphenous Vein: No evidence of thrombus. Normal compressibility and flow on color Doppler imaging. Venous Reflux:  None. Other Findings:  None. IMPRESSION: No evidence of significant occlusive right lower extremity deep venous thrombosis. Electronically Signed   By: Jerilynn Mages.  Shick M.D.   On: 05/10/2016 15:55    Impression/Plan: We discussed adjuvant radiotherapy today.  I recommend radiotherapy to the left breast in order to reduce risk of locoregional recurrence by 2/3. The risks, benefits and side effects of this treatment were discussed in detail.  She understands that radiotherapy is associated with skin irritation and fatigue in the acute setting. Late effects can include cosmetic changes and rare injury to internal organs.   She is agreeable about proceeding with treatment. A consent form has been signed and placed in her chart.  Of note, mammosite was discussed at her consult given her very large breasts, however, the patient has already healed from surgery.  While it may be feasible to reinsert a catheter, I am not sure if this is something that her surgeon would recommend.  I'll send a note to Dr Excell Seltzer.  Otherwise, I will treat her in the prone  position w/ external beam RT over 6 weeks.  She requests morning time slots. Refer to social work for distress. Emotional support provided today.     _____________________________________   Eppie Gibson, MD This document serves as a record of services personally performed by Eppie Gibson, MD. It was created on her behalf by Bethann Humble, a trained medical scribe. The creation of this record is based on the scribe's personal observations and the provider's statements to them. This document has been checked and approved by the attending provider.

## 2016-05-18 ENCOUNTER — Telehealth: Payer: Self-pay | Admitting: *Deleted

## 2016-05-18 NOTE — Telephone Encounter (Signed)
CALLED LAUREN MULLIS TO INFORM THAT DR. SQUIRE PUT IN A REFERRAL FOR SOCIAL WORKER, LVM FOR HER TO FOLLOW-UP ON THIS

## 2016-05-24 ENCOUNTER — Ambulatory Visit (INDEPENDENT_AMBULATORY_CARE_PROVIDER_SITE_OTHER): Payer: Medicare Other | Admitting: Endocrinology

## 2016-05-24 ENCOUNTER — Encounter: Payer: Self-pay | Admitting: Endocrinology

## 2016-05-24 VITALS — BP 142/87 | HR 78 | Temp 98.0°F | Resp 16 | Ht 64.0 in | Wt 292.0 lb

## 2016-05-24 DIAGNOSIS — E040 Nontoxic diffuse goiter: Secondary | ICD-10-CM | POA: Diagnosis not present

## 2016-05-24 DIAGNOSIS — E012 Iodine-deficiency related (endemic) goiter, unspecified: Secondary | ICD-10-CM

## 2016-05-24 NOTE — Progress Notes (Signed)
Patient ID: Sheila Jacobs, female   DOB: 12/08/1950, 65 y.o.   MRN: [id number]           Reason for Appointment: Goiter, follow-up    History of Present Illness:   The patient's thyroid enlargement was first discovered in 2009 probably on her routine physical exam  [facility]   She however does not know when her goiter first started    On her initial evaluation she did have needle biopsy of her large right-sided nodule which was benign   She has never been on thyroid supplements  Clinically she was felt to have a colloid goiter, mostly right sided   She does not think that her thyroid swelling has increased in size and she does not feel any local pressure or notice any difficulty swallowing. She is here now for follow-up    She has had mild decrease in TSH without increased free T4 and this is about the same as in May Total T3 done in 8/17 was 128, previously 105   Lab Results  Component Value Date   FREET4 1.2 05/09/2016   FREET4 1.2 01/16/2016   FREET4 1.23 02/16/2008   TSH 0.25 (L) 05/09/2016   TSH 0.26 (L) 01/16/2016   TSH 0.526 07/13/2015    She has had an ultrasound exam in  2009  Thyroid biopsy in  2009  Showed nonneoplastic goiter      Medication List       Accurate as of 05/24/16  3:03 PM. Always use your most recent med list.          ALPRAZolam 0.25 MG tablet Commonly known as:  XANAX Take 1 tablet (0.25 mg total) by mouth 2 (two) times daily as needed for anxiety.   aspirin EC 81 MG tablet Take 81 mg by mouth daily.   calcium citrate-vitamin D 315-200 MG-UNIT tablet Commonly known as:  CITRACAL+D Take 1 tablet by mouth daily.   hydrochlorothiazide 25 MG tablet Commonly known as:  HYDRODIURIL Take 0.5 tablets (12.5 mg total) by mouth daily.   HYDROcodone-acetaminophen 5-325 MG tablet Commonly known as:  NORCO/VICODIN Take 1-2 tablets by mouth every 4 (four) hours as needed for moderate pain or  severe pain.   omeprazole 40 MG capsule Commonly known as:  PRILOSEC Take 1 capsule (40 mg total) by mouth daily.   potassium chloride SA 20 MEQ tablet Commonly known as:  K-DUR,KLOR-CON Take 1 tablet (20 mEq total) by mouth daily.       Allergies:  Allergies  Allergen Reactions  . Cortisone Rash    Past Medical History:  Diagnosis Date  . Anxiety   . Arthritis   . Breast cancer of upper-outer quadrant of left female breast ([facility]) 01/31/2016  . Colon polyps   . Enlarged LA (left atrium)   . GERD (gastroesophageal reflux disease)   . Hard of hearing    Wears hearing aids.  . Headache   . Heavy cigarette smoker    Quit 2015  . Hyperlipidemia   . Hypertension   . Morbid obesity ([facility])   . Numbness and tingling   . Obesity   . Thyroid goiter   . Vertigo    on occasion    Past Surgical History:  Procedure Laterality Date  . BREAST LUMPECTOMY WITH RADIOACTIVE SEED AND SENTINEL LYMPH NODE BIOPSY Left 04/11/2016   Procedure: BREAST LUMPECTOMY WITH RADIOACTIVE SEED AND SENTINEL LYMPH NODE BIOPSY;  Surgeon: Excell Seltzer,  MD;  Location: [facility];  Service: General;  Laterality: Left;  . COLONOSCOPY WITH PROPOFOL N/A 10/20/2015   Procedure: COLONOSCOPY WITH PROPOFOL;  Surgeon: Mauri Pole, MD;  Location: [facility];  Service: Endoscopy;  Laterality: N/A;  . ESOPHAGOGASTRODUODENOSCOPY (EGD) WITH PROPOFOL N/A 10/20/2015   Procedure: ESOPHAGOGASTRODUODENOSCOPY (EGD) WITH PROPOFOL;  Surgeon: Mauri Pole, MD;  Location: [facility];  Service: Endoscopy;  Laterality: N/A;  . LEFT HEART CATHETERIZATION WITH CORONARY ANGIOGRAM N/A 10/21/2014   Procedure: LEFT HEART CATHETERIZATION WITH CORONARY ANGIOGRAM;  Surgeon: Burnell Blanks, MD;  Location: [facility];  Service: Cardiovascular;  Laterality: N/A;  . TUBAL LIGATION      Family History  Problem Relation Age of Onset  . Hypertension Mother   . Heart disease Mother     Pacemaker  . Kidney disease Mother   .  Cataracts Mother   . Hearing loss Mother   . Diabetes Mother   . Liver disease Mother   . Diabetes Sister   . Cataracts Brother   . Hearing loss Brother   . Colon cancer Neg Hx   . Thyroid disease Neg Hx     Social History:  reports that she quit smoking about 22 months ago. Her smoking use included Cigarettes. She has a 45.00 pack-year smoking history. She has never used smokeless tobacco. She reports that she does not drink alcohol or use drugs.    Review of Systems    Examination:   BP (!) 142/87   Pulse 78   Temp 98 F (36.7 C)   Resp 16   Ht 5\' 4"  (1.626 m)   Wt 292 lb (132.5 kg)   SpO2 98%   BMI 50.12 kg/m           The thyroid is enlarged mostly on the right side, about 4-5 times normal, smooth, relatively soft. Left-sided minimally enlarged Neck circumference is 42 cm over the thyroid There is no stridor. Pemberton sign is negative There is no lymphadenopathy in the neck .     REFLEXES: at biceps are normal.    Assessment/Plan:   Long-standing goiter with mostly right Lobe enlargement   Clinically she appears to  have a colloid nodule which was benign on biopsy in 2009  She is again asymptomatic Her neck circumference is about the same as on the last visit Most of the enlargement of the right lobe is external    Her thyroid levels have been showing mild decrease in TSH but not in the hyperthyroid range Also free T4 and T3 levels are quite normal and stable  Reassured the patient that no further action is needed   She can come back annually for follow-up   [facility] 05/24/2016

## 2016-05-30 ENCOUNTER — Ambulatory Visit: Payer: Medicare Other | Admitting: Radiation Oncology

## 2016-06-06 ENCOUNTER — Ambulatory Visit: Payer: Medicare Other | Admitting: Radiation Oncology

## 2016-06-08 ENCOUNTER — Ambulatory Visit
Admission: RE | Admit: 2016-06-08 | Discharge: 2016-06-08 | Disposition: A | Discharge status: Home / Self Care | Payer: Medicare Other | Source: Ambulatory Visit | Attending: Radiation Oncology | Admitting: Radiation Oncology

## 2016-06-08 ENCOUNTER — Encounter: Payer: Self-pay | Admitting: *Deleted

## 2016-06-08 DIAGNOSIS — C50412 Malignant neoplasm of upper-outer quadrant of left female breast: Secondary | ICD-10-CM

## 2016-06-08 DIAGNOSIS — Z510 Encounter for antineoplastic radiation therapy: Secondary | ICD-10-CM | POA: Diagnosis not present

## 2016-06-08 DIAGNOSIS — Z170 Estrogen receptor positive status [ER+]: Secondary | ICD-10-CM | POA: Diagnosis not present

## 2016-06-08 NOTE — Progress Notes (Signed)
SIMULATION / TREATMENT PLANNING NOTE   The patient was taken to the CT simulator and laid iI plan to treat the patient's left breast with opposed tangents, using MLCs for custom blocks, to a dose of 50.4Gy/28 fractions. 3-D conformal radiotherapy will be used to allow adequate dose to be delivered to her breast tissue while sparing her heart and lungs.   I anticipate planning a boost to the lumpetomy cavity of 10 Gy/5 fractions in the future.   -----------------------------------  Eppie Gibson, MD    Radiation Oncology         [phone] ________________________________  Name: Sheila Jacobs MRN: [id number]  Date: 06/08/2016  DOB: 12-11-1950  SIMULATION AND TREATMENT PLANNING NOTE    Outpatient  DIAGNOSIS:     ICD-9-CM ICD-10-CM   1. Breast cancer of upper-outer quadrant of left female breast ([facility]) 174.4 C50.412     NARRATIVE:  The patient was brought to the [facility].  Identity was confirmed.  All relevant records and images related to the planned course of therapy were reviewed.  The patient freely provided informed written consent to proceed with treatment after reviewing the details related to the planned course of therapy. The consent form was witnessed and verified by the simulation staff.    Then, the patient was set-up in a stable reproducible  prone position, arms over her head, on a prone breast board with her left breast hanging through an opening. High resolution CT axial imaging was obtained of the patient's chest/breast . An isocenter was placed in the left breast. Skin markings are made and she tolerated the procedure well.   The CT images were loaded into the planning software.    TREATMENT PLANNING NOTE: Treatment planning then occurred.  The radiation prescription was entered and confirmed.     A total of 2 medically necessary complex treatment devices were fabricated and supervised by me: 2 fields with MLCs for custom blocks to protect heart,  and lungs. MORE COMPLEX DEVICES MAY BE MADE IN DOSIMETRY FOR FIELD IN FIELD BEAMS FOR DOSE HOMOGENEITY.  I have requested : 3D Simulation  I have requested a DVH of the following structures: lungs, heart, lumpectomy cavity.    The patient will receive 45 Gy in 25 fractions to the Left with 2 tangential fields.   This will be followed by a boost.  Optical Surface Tracking Plan:  Since intensity modulated radiotherapy (IMRT) and 3D conformal radiation treatment methods are predicated on accurate and precise positioning for treatment, intrafraction motion monitoring is medically necessary to ensure accurate and safe treatment delivery. The ability to quantify intrafraction motion without excessive ionizing radiation dose can only be performed with optical surface tracking. Accordingly, surface imaging offers the opportunity to obtain 3D measurements of patient position throughout IMRT and 3D treatments without excessive radiation exposure. I am ordering optical surface tracking for this patient's upcoming course of radiotherapy.  ________________________________   Reference:  Ursula Alert, J, et al. Surface imaging-based analysis of intrafraction motion for breast radiotherapy patients.Journal of [place], n. 6, nov. 2014. ISSN [id number].  Available at: <http://www.jacmp.org/index.php/jacmp/article/view/4957>.    Social work was consulted to see the patient today to help with transportation and significant social stressors.  I also spoke with the patient for a while to provide support and encouragement, and to address her questions and concerns.  Of note, I've talked to her surgeon and Mammosite is not considered a good option for her  given that she has completely healed from surgery.  -----------------------------------  Eppie Gibson, MD

## 2016-06-08 NOTE — Progress Notes (Signed)
[place] Work  Clinical Social Work was referred by P[employer] scheme manager for assessment of psychosocial needs due to coping and childcare concerns.  Clinical Social Worker met with patient at [facility] after SIM to offer support and assess for needs.  Pt requesting bus passes for during treatment and denied any issues or concerns with her adjustment to her illness. CSW reviewed [employer] transportation grant and we could assist with bus passes during treatment. CSW educated pt that financial counselors can provide bus pass on first day of treatment and CSW will make referral. Pt appears eligible for [employer] for ast with bills and household expenses/medications as well. Pt expressed concerns about childcare, as she cares for her 27 yo granddaughter while family works. She inquired if we had a daycare on site. CSW educated pt that we did not and problem solved with pt to find other possible childcare arrangements through family. Pt made aware [employer] cannot provide childcare during radiation. Pt stated understanding and is hopeful her schedule can be changed to accommodate this issue. CSW reviewed other resources for support, but pt declined these options currently.    Clinical Social Work interventions: Resource education and referral Supportive listening  Loren Racer, [place]  [place] Phone: [phone] Fax: [phone]

## 2016-06-10 ENCOUNTER — Telehealth: Payer: Self-pay | Admitting: Hematology and Oncology

## 2016-06-10 NOTE — Telephone Encounter (Signed)
Lvm advising appt 12/14 @ 11.15am.

## 2016-06-11 DIAGNOSIS — Z510 Encounter for antineoplastic radiation therapy: Secondary | ICD-10-CM | POA: Diagnosis not present

## 2016-06-11 DIAGNOSIS — Z170 Estrogen receptor positive status [ER+]: Secondary | ICD-10-CM | POA: Diagnosis not present

## 2016-06-11 DIAGNOSIS — C50412 Malignant neoplasm of upper-outer quadrant of left female breast: Secondary | ICD-10-CM | POA: Diagnosis not present

## 2016-06-15 ENCOUNTER — Ambulatory Visit: Payer: Medicare Other | Attending: Radiation Oncology

## 2016-06-15 ENCOUNTER — Ambulatory Visit
Admission: RE | Admit: 2016-06-15 | Discharge: 2016-06-15 | Disposition: A | Discharge status: Home / Self Care | Payer: Medicare Other | Source: Ambulatory Visit | Attending: Radiation Oncology | Admitting: Radiation Oncology

## 2016-06-15 DIAGNOSIS — Z510 Encounter for antineoplastic radiation therapy: Secondary | ICD-10-CM | POA: Diagnosis not present

## 2016-06-15 DIAGNOSIS — Z170 Estrogen receptor positive status [ER+]: Secondary | ICD-10-CM | POA: Diagnosis not present

## 2016-06-15 DIAGNOSIS — C50412 Malignant neoplasm of upper-outer quadrant of left female breast: Secondary | ICD-10-CM | POA: Diagnosis not present

## 2016-06-18 ENCOUNTER — Ambulatory Visit
Admission: RE | Admit: 2016-06-18 | Discharge: 2016-06-18 | Disposition: A | Discharge status: Home / Self Care | Payer: Medicare Other | Source: Ambulatory Visit | Attending: Radiation Oncology | Admitting: Radiation Oncology

## 2016-06-18 ENCOUNTER — Encounter: Payer: Self-pay | Admitting: Radiation Oncology

## 2016-06-18 VITALS — BP 124/64 | HR 73 | Temp 98.4°F | Ht 64.0 in | Wt 294.6 lb

## 2016-06-18 DIAGNOSIS — Z170 Estrogen receptor positive status [ER+]: Principal | ICD-10-CM

## 2016-06-18 DIAGNOSIS — Z510 Encounter for antineoplastic radiation therapy: Secondary | ICD-10-CM | POA: Diagnosis not present

## 2016-06-18 DIAGNOSIS — C50412 Malignant neoplasm of upper-outer quadrant of left female breast: Secondary | ICD-10-CM | POA: Diagnosis not present

## 2016-06-18 NOTE — Progress Notes (Signed)
   Weekly Management Note:  outpatient    ICD-9-CM ICD-10-CM   1. Malignant neoplasm of upper-outer quadrant of left breast in female, estrogen receptor positive ([facility]) 174.4 C50.412    V86.0 Z17.0     Current Dose:  1.8 Gy  Projected Dose: 51 Gy   Narrative:  The patient presents for routine under treatment assessment.  CBCT/MVCT images/Port film x-rays were reviewed.  The chart was checked. Doing well, saw social work last week, somewhat nervous about treatment  Physical Findings:  height is 5\' 4"  (1.626 m) and weight is 294 lb 9.6 oz (133.6 kg). Her temperature is 98.4 F (36.9 C). Her blood pressure is 124/64 and her pulse is 73.   Wt Readings from Last 3 Encounters:  06/18/16 294 lb 9.6 oz (133.6 kg)  05/24/16 292 lb (132.5 kg)  05/16/16 294 lb 14.4 oz (133.8 kg)   NAD, no skin irritation over left breast  Impression:  The patient is tolerating radiotherapy.  Plan:  Continue radiotherapy as planned. Patient instructed to apply Radiplex to intact skin in treatment fields.  Social work contacted (voicemail left today by me)  to close loop on pt's questions about resources.   ________________________________   Eppie Gibson, M.D.

## 2016-06-18 NOTE — Progress Notes (Signed)
Sheila Jacobs has received her 1st fraction to her left breast.  Denies any pain.  Educated on skin care.

## 2016-06-19 ENCOUNTER — Ambulatory Visit
Admission: RE | Admit: 2016-06-19 | Discharge: 2016-06-19 | Disposition: A | Discharge status: Home / Self Care | Payer: Medicare Other | Source: Ambulatory Visit | Attending: Radiation Oncology | Admitting: Radiation Oncology

## 2016-06-19 DIAGNOSIS — Z510 Encounter for antineoplastic radiation therapy: Secondary | ICD-10-CM | POA: Diagnosis not present

## 2016-06-19 DIAGNOSIS — C50412 Malignant neoplasm of upper-outer quadrant of left female breast: Secondary | ICD-10-CM | POA: Diagnosis not present

## 2016-06-19 DIAGNOSIS — Z170 Estrogen receptor positive status [ER+]: Secondary | ICD-10-CM | POA: Diagnosis not present

## 2016-06-20 ENCOUNTER — Ambulatory Visit
Admission: RE | Admit: 2016-06-20 | Discharge: 2016-06-20 | Disposition: A | Discharge status: Home / Self Care | Payer: Medicare Other | Source: Ambulatory Visit | Attending: Radiation Oncology | Admitting: Radiation Oncology

## 2016-06-20 DIAGNOSIS — Z170 Estrogen receptor positive status [ER+]: Secondary | ICD-10-CM | POA: Diagnosis not present

## 2016-06-20 DIAGNOSIS — Z510 Encounter for antineoplastic radiation therapy: Secondary | ICD-10-CM | POA: Diagnosis not present

## 2016-06-20 DIAGNOSIS — C50412 Malignant neoplasm of upper-outer quadrant of left female breast: Secondary | ICD-10-CM | POA: Diagnosis not present

## 2016-06-21 ENCOUNTER — Ambulatory Visit
Admission: RE | Admit: 2016-06-21 | Discharge: 2016-06-21 | Disposition: A | Discharge status: Home / Self Care | Payer: Medicare Other | Source: Ambulatory Visit | Attending: Radiation Oncology | Admitting: Radiation Oncology

## 2016-06-21 DIAGNOSIS — C50412 Malignant neoplasm of upper-outer quadrant of left female breast: Secondary | ICD-10-CM | POA: Diagnosis not present

## 2016-06-21 DIAGNOSIS — Z510 Encounter for antineoplastic radiation therapy: Secondary | ICD-10-CM | POA: Diagnosis not present

## 2016-06-21 DIAGNOSIS — Z170 Estrogen receptor positive status [ER+]: Secondary | ICD-10-CM | POA: Diagnosis not present

## 2016-06-22 ENCOUNTER — Ambulatory Visit: Payer: Medicare Other

## 2016-06-25 ENCOUNTER — Ambulatory Visit: Payer: Medicare Other

## 2016-06-25 ENCOUNTER — Ambulatory Visit: Admission: RE | Admit: 2016-06-25 | Payer: Medicare Other | Source: Ambulatory Visit | Admitting: Radiation Oncology

## 2016-06-26 ENCOUNTER — Ambulatory Visit
Admission: RE | Admit: 2016-06-26 | Discharge: 2016-06-26 | Disposition: A | Discharge status: Home / Self Care | Payer: Medicare Other | Source: Ambulatory Visit | Attending: Radiation Oncology | Admitting: Radiation Oncology

## 2016-06-26 ENCOUNTER — Encounter: Payer: Self-pay | Admitting: Radiation Oncology

## 2016-06-26 VITALS — BP 125/65 | HR 71 | Temp 97.7°F | Ht 64.0 in | Wt 297.4 lb

## 2016-06-26 DIAGNOSIS — C50412 Malignant neoplasm of upper-outer quadrant of left female breast: Secondary | ICD-10-CM | POA: Diagnosis not present

## 2016-06-26 DIAGNOSIS — Z170 Estrogen receptor positive status [ER+]: Principal | ICD-10-CM

## 2016-06-26 DIAGNOSIS — Z510 Encounter for antineoplastic radiation therapy: Secondary | ICD-10-CM | POA: Diagnosis not present

## 2016-06-26 NOTE — Progress Notes (Signed)
   Weekly Management Note:  outpatient    ICD-9-CM ICD-10-CM   1. Malignant neoplasm of upper-outer quadrant of left breast in female, estrogen receptor positive ([facility]) 174.4 C50.412    V86.0 Z17.0     Current Dose:  9.0 Gy  Projected Dose: 51 Gy   Narrative:  The patient presents for routine under treatment assessment.  CBCT/MVCT images/Port film x-rays were reviewed.  The chart was checked. Doing well, missed treatment yesterday due to transportation issues  Physical Findings:  height is 5\' 4"  (1.626 m) and weight is 297 lb 6.4 oz (134.9 kg). Her temperature is 97.7 F (36.5 C). Her blood pressure is 125/65 and her pulse is 71. Her oxygen saturation is 100%.   Wt Readings from Last 3 Encounters:  06/26/16 297 lb 6.4 oz (134.9 kg)  06/18/16 294 lb 9.6 oz (133.6 kg)  05/24/16 292 lb (132.5 kg)   NAD, no skin irritation over left breast  Impression:  The patient is tolerating radiotherapy.  Plan:  Continue radiotherapy as planned. Patient instructed to apply Radiplex to intact skin in treatment fields.  Encourage patient to call us if she is running late, as we can still possibly treat her.  She assumed yesterday that was not possible.   ________________________________   Eppie Gibson, M.D.

## 2016-06-26 NOTE — Progress Notes (Signed)
Sheila Jacobs is here for her 5th fraction of radiation to her Left Breast. She reports "soreness" to her bilateral upper arms. She denies fatigue. She has some mild hyperpigmentation to her Left Breast. She denies any tenderness. She has been using the radiaplex occasionally, but will start to use it twice daily.   BP 125/65   Pulse 71   Temp 97.7 F (36.5 C)   Ht 5\' 4"  (1.626 m)   Wt 297 lb 6.4 oz (134.9 kg)   SpO2 100% Comment: room air  BMI 51.05 kg/m    Wt Readings from Last 3 Encounters:  06/26/16 297 lb 6.4 oz (134.9 kg)  06/18/16 294 lb 9.6 oz (133.6 kg)  05/24/16 292 lb (132.5 kg)

## 2016-06-27 ENCOUNTER — Telehealth: Payer: Self-pay | Admitting: *Deleted

## 2016-06-27 ENCOUNTER — Ambulatory Visit
Admission: RE | Admit: 2016-06-27 | Discharge: 2016-06-27 | Disposition: A | Discharge status: Home / Self Care | Payer: Medicare Other | Source: Ambulatory Visit | Attending: Radiation Oncology | Admitting: Radiation Oncology

## 2016-06-27 DIAGNOSIS — Z510 Encounter for antineoplastic radiation therapy: Secondary | ICD-10-CM | POA: Diagnosis not present

## 2016-06-27 DIAGNOSIS — C50412 Malignant neoplasm of upper-outer quadrant of left female breast: Secondary | ICD-10-CM | POA: Diagnosis not present

## 2016-06-27 DIAGNOSIS — Z170 Estrogen receptor positive status [ER+]: Secondary | ICD-10-CM | POA: Diagnosis not present

## 2016-06-27 NOTE — Telephone Encounter (Signed)
"  I'm having trouble getting someone to pick up the kids.  I'm running late bit on my way."  Call transferred ext [phone].

## 2016-06-28 ENCOUNTER — Ambulatory Visit
Admission: RE | Admit: 2016-06-28 | Discharge: 2016-06-28 | Disposition: A | Discharge status: Home / Self Care | Payer: Medicare Other | Source: Ambulatory Visit | Attending: Radiation Oncology | Admitting: Radiation Oncology

## 2016-06-28 DIAGNOSIS — C50412 Malignant neoplasm of upper-outer quadrant of left female breast: Secondary | ICD-10-CM | POA: Diagnosis not present

## 2016-06-28 DIAGNOSIS — Z170 Estrogen receptor positive status [ER+]: Secondary | ICD-10-CM | POA: Diagnosis not present

## 2016-06-28 DIAGNOSIS — Z510 Encounter for antineoplastic radiation therapy: Secondary | ICD-10-CM | POA: Diagnosis not present

## 2016-06-29 ENCOUNTER — Ambulatory Visit
Admission: RE | Admit: 2016-06-29 | Discharge: 2016-06-29 | Disposition: A | Discharge status: Home / Self Care | Payer: Medicare Other | Source: Ambulatory Visit | Attending: Radiation Oncology | Admitting: Radiation Oncology

## 2016-06-29 DIAGNOSIS — Z170 Estrogen receptor positive status [ER+]: Secondary | ICD-10-CM | POA: Diagnosis not present

## 2016-06-29 DIAGNOSIS — Z510 Encounter for antineoplastic radiation therapy: Secondary | ICD-10-CM | POA: Diagnosis not present

## 2016-06-29 DIAGNOSIS — C50412 Malignant neoplasm of upper-outer quadrant of left female breast: Secondary | ICD-10-CM | POA: Diagnosis not present

## 2016-07-02 ENCOUNTER — Ambulatory Visit
Admission: RE | Admit: 2016-07-02 | Discharge: 2016-07-02 | Disposition: A | Discharge status: Home / Self Care | Payer: Medicare Other | Source: Ambulatory Visit | Attending: Radiation Oncology | Admitting: Radiation Oncology

## 2016-07-02 ENCOUNTER — Encounter: Payer: Self-pay | Admitting: Radiation Oncology

## 2016-07-02 VITALS — BP 127/74 | HR 71 | Temp 98.2°F | Ht 64.0 in | Wt 295.2 lb

## 2016-07-02 DIAGNOSIS — Z170 Estrogen receptor positive status [ER+]: Secondary | ICD-10-CM | POA: Insufficient documentation

## 2016-07-02 DIAGNOSIS — Z510 Encounter for antineoplastic radiation therapy: Secondary | ICD-10-CM | POA: Diagnosis not present

## 2016-07-02 DIAGNOSIS — C50412 Malignant neoplasm of upper-outer quadrant of left female breast: Secondary | ICD-10-CM | POA: Diagnosis not present

## 2016-07-02 MED ORDER — RADIAPLEXRX EX GEL
Freq: Once | CUTANEOUS | Status: AC
  Administered 2016-07-02: 15:00:00 via TOPICAL

## 2016-07-02 NOTE — Progress Notes (Signed)
Ms. Cazier presents for her 9th fraction of radiation to her Left Breast. She denies pain, or fatigue. Her Left Breast is slightly hyperpigmented. She has been using the Radiaplex twice daily, and was provided with a second tube today. She has no other concerns at this time.   BP 127/74   Pulse 71   Temp 98.2 F (36.8 C)   Ht 5\' 4"  (1.626 m)   Wt 295 lb 3.2 oz (133.9 kg)   SpO2 98% Comment: room air  BMI 50.67 kg/m    Wt Readings from Last 3 Encounters:  07/02/16 295 lb 3.2 oz (133.9 kg)  06/26/16 297 lb 6.4 oz (134.9 kg)  06/18/16 294 lb 9.6 oz (133.6 kg)

## 2016-07-02 NOTE — Addendum Note (Signed)
Encounter addended by: Ernst Spell, RN on: 07/02/2016  3:22 PM<BR>    Actions taken: [facility] administration accepted

## 2016-07-02 NOTE — Progress Notes (Signed)
   Weekly Management Note:  outpatient    ICD-9-CM ICD-10-CM   1. Malignant neoplasm of upper-outer quadrant of left breast in female, estrogen receptor positive ([facility]) 174.4 C50.412 hyaluronate sodium (RADIAPLEXRX) gel   V86.0 Z17.0     Current Dose: 16.2 Gy  Projected Dose: 51 Gy   Narrative:  The patient presents for routine under treatment assessment.  CBCT/MVCT images/Port film x-rays were reviewed.  The chart was checked. Doing well   Physical Findings:  height is 5\' 4"  (1.626 m) and weight is 295 lb 3.2 oz (133.9 kg). Her temperature is 98.2 F (36.8 C). Her blood pressure is 127/74 and her pulse is 71. Her oxygen saturation is 98%.   Wt Readings from Last 3 Encounters:  07/02/16 295 lb 3.2 oz (133.9 kg)  06/26/16 297 lb 6.4 oz (134.9 kg)  06/18/16 294 lb 9.6 oz (133.6 kg)   NAD, slight hyperpigmentation over left breast  Impression:  The patient is tolerating radiotherapy.  Plan:  Continue radiotherapy as planned. Patient instructed to apply Radiplex to intact skin in treatment fields. ________________________________   Eppie Gibson, M.D.

## 2016-07-03 ENCOUNTER — Ambulatory Visit
Admission: RE | Admit: 2016-07-03 | Discharge: 2016-07-03 | Disposition: A | Discharge status: Home / Self Care | Payer: Medicare Other | Source: Ambulatory Visit | Attending: Radiation Oncology | Admitting: Radiation Oncology

## 2016-07-03 ENCOUNTER — Encounter: Payer: Self-pay | Admitting: Radiation Oncology

## 2016-07-03 DIAGNOSIS — Z170 Estrogen receptor positive status [ER+]: Secondary | ICD-10-CM | POA: Diagnosis not present

## 2016-07-03 DIAGNOSIS — Z510 Encounter for antineoplastic radiation therapy: Secondary | ICD-10-CM | POA: Diagnosis not present

## 2016-07-03 DIAGNOSIS — C50412 Malignant neoplasm of upper-outer quadrant of left female breast: Secondary | ICD-10-CM | POA: Diagnosis not present

## 2016-07-03 NOTE — Progress Notes (Signed)
Met w/ patient today about grants.  Signed her up for [employer] transportation grant.  She will bring back income info tomorrow to complete grant qualifications.

## 2016-07-04 ENCOUNTER — Ambulatory Visit
Admission: RE | Admit: 2016-07-04 | Discharge: 2016-07-04 | Disposition: A | Discharge status: Home / Self Care | Payer: Medicare Other | Source: Ambulatory Visit | Attending: Radiation Oncology | Admitting: Radiation Oncology

## 2016-07-04 DIAGNOSIS — Z510 Encounter for antineoplastic radiation therapy: Secondary | ICD-10-CM | POA: Diagnosis not present

## 2016-07-04 DIAGNOSIS — Z170 Estrogen receptor positive status [ER+]: Secondary | ICD-10-CM | POA: Diagnosis not present

## 2016-07-04 DIAGNOSIS — C50412 Malignant neoplasm of upper-outer quadrant of left female breast: Secondary | ICD-10-CM | POA: Diagnosis not present

## 2016-07-05 ENCOUNTER — Ambulatory Visit
Admission: RE | Admit: 2016-07-05 | Discharge: 2016-07-05 | Disposition: A | Discharge status: Home / Self Care | Payer: Medicare Other | Source: Ambulatory Visit | Attending: Radiation Oncology | Admitting: Radiation Oncology

## 2016-07-05 DIAGNOSIS — Z510 Encounter for antineoplastic radiation therapy: Secondary | ICD-10-CM | POA: Diagnosis not present

## 2016-07-05 DIAGNOSIS — Z170 Estrogen receptor positive status [ER+]: Secondary | ICD-10-CM | POA: Diagnosis not present

## 2016-07-05 DIAGNOSIS — C50412 Malignant neoplasm of upper-outer quadrant of left female breast: Secondary | ICD-10-CM | POA: Diagnosis not present

## 2016-07-06 ENCOUNTER — Ambulatory Visit
Admission: RE | Admit: 2016-07-06 | Discharge: 2016-07-06 | Disposition: A | Discharge status: Home / Self Care | Payer: Medicare Other | Source: Ambulatory Visit | Attending: Radiation Oncology | Admitting: Radiation Oncology

## 2016-07-06 DIAGNOSIS — C50412 Malignant neoplasm of upper-outer quadrant of left female breast: Secondary | ICD-10-CM | POA: Diagnosis not present

## 2016-07-06 DIAGNOSIS — Z170 Estrogen receptor positive status [ER+]: Secondary | ICD-10-CM | POA: Diagnosis not present

## 2016-07-06 DIAGNOSIS — Z510 Encounter for antineoplastic radiation therapy: Secondary | ICD-10-CM | POA: Diagnosis not present

## 2016-07-09 ENCOUNTER — Encounter: Payer: Self-pay | Admitting: Radiation Oncology

## 2016-07-09 ENCOUNTER — Ambulatory Visit
Admission: RE | Admit: 2016-07-09 | Discharge: 2016-07-09 | Disposition: A | Discharge status: Home / Self Care | Payer: Medicare Other | Source: Ambulatory Visit | Attending: Radiation Oncology | Admitting: Radiation Oncology

## 2016-07-09 VITALS — BP 157/70 | HR 73 | Temp 98.3°F | Resp 18 | Ht 64.0 in | Wt 287.2 lb

## 2016-07-09 DIAGNOSIS — C50412 Malignant neoplasm of upper-outer quadrant of left female breast: Secondary | ICD-10-CM

## 2016-07-09 DIAGNOSIS — Z170 Estrogen receptor positive status [ER+]: Principal | ICD-10-CM

## 2016-07-09 DIAGNOSIS — Z510 Encounter for antineoplastic radiation therapy: Secondary | ICD-10-CM | POA: Diagnosis not present

## 2016-07-09 NOTE — Progress Notes (Signed)
Sheila Jacobs presents for her 14th fraction of radiation to her Left Breast. She denies pain, or fatigue. Her Left Breast is slightly hyperpigmented. She has been using the Radiaplex twice daily. She has no other concerns at this time.  Appetite is good. Wt Readings from Last 3 Encounters:  07/09/16 287 lb 3.2 oz (130.3 kg)  07/02/16 295 lb 3.2 oz (133.9 kg)  06/26/16 297 lb 6.4 oz (134.9 kg)  BP (!) 157/70   Pulse 73   Temp 98.3 F (36.8 C) (Oral)   Resp 18   Ht 5\' 4"  (1.626 m)   Wt 287 lb 3.2 oz (130.3 kg)   SpO2 100%   BMI 49.30 kg/m

## 2016-07-09 NOTE — Progress Notes (Signed)
[employer] trader Note  Diagnosis: Breast Cancer  Malignant neoplasm of upper-outer quadrant of left breast in female, estrogen receptor positive ([place]) 174.4 C50.412  V86.0 Z17.0    The patient's CT images from her prone simulation were reviewed to plan her boost treatment to her left breast  lumpectomy cavity.  The boost to the lumpectomy cavity will be delivered with 3 photon fields using MLCs for custom blocks again heart and lungs, with 10 and 6 MV photon energy.  This constitutes 3 complex treatment devices. Isodose plan was reviewed and approved. 6 Gy in 3 fractions prescribed.  -----------------------------------  Eppie Gibson, MD

## 2016-07-09 NOTE — Progress Notes (Signed)
   Weekly Management Note:  outpatient    ICD-9-CM ICD-10-CM   1. Malignant neoplasm of upper-outer quadrant of left breast in female, estrogen receptor positive (HCC) 174.4 C50.412    V86.0 Z17.0     Current Dose: 25.2 Gy  Projected Dose: 51 Gy   Narrative:  The patient presents for routine under treatment assessment.  CBCT/MVCT images/Port film x-rays were reviewed.  The chart was checked. Doing well - noted more darkness over underside of left breast  Physical Findings:  height is 5\' 4"  (1.626 m) and weight is 287 lb 3.2 oz (130.3 kg). Her oral temperature is 98.3 F (36.8 C). Her blood pressure is 157/70 (abnormal) and her pulse is 73. Her respiration is 18 and oxygen saturation is 100%.   Wt Readings from Last 3 Encounters:  07/09/16 287 lb 3.2 oz (130.3 kg)  07/02/16 295 lb 3.2 oz (133.9 kg)  06/26/16 297 lb 6.4 oz (134.9 kg)   NAD, slight hyperpigmentation over left breast - more so in IM fold; skin intact  Impression:  The patient is tolerating radiotherapy.  Plan:  Continue radiotherapy as planned. Patient instructed to apply Radiplex to intact skin in treatment fields  ________________________________   Eppie Gibson, M.D.

## 2016-07-10 ENCOUNTER — Emergency Department (HOSPITAL_COMMUNITY): Payer: Medicare Other

## 2016-07-10 ENCOUNTER — Ambulatory Visit
Admission: RE | Admit: 2016-07-10 | Discharge: 2016-07-10 | Disposition: A | Discharge status: Home / Self Care | Payer: Medicare Other | Source: Ambulatory Visit | Attending: Radiation Oncology | Admitting: Radiation Oncology

## 2016-07-10 ENCOUNTER — Encounter (HOSPITAL_COMMUNITY): Payer: Self-pay | Admitting: Emergency Medicine

## 2016-07-10 DIAGNOSIS — Z510 Encounter for antineoplastic radiation therapy: Secondary | ICD-10-CM | POA: Diagnosis not present

## 2016-07-10 DIAGNOSIS — Z7982 Long term (current) use of aspirin: Secondary | ICD-10-CM | POA: Insufficient documentation

## 2016-07-10 DIAGNOSIS — Z87891 Personal history of nicotine dependence: Secondary | ICD-10-CM | POA: Insufficient documentation

## 2016-07-10 DIAGNOSIS — I10 Essential (primary) hypertension: Secondary | ICD-10-CM | POA: Insufficient documentation

## 2016-07-10 DIAGNOSIS — C50412 Malignant neoplasm of upper-outer quadrant of left female breast: Secondary | ICD-10-CM | POA: Diagnosis not present

## 2016-07-10 DIAGNOSIS — R002 Palpitations: Secondary | ICD-10-CM | POA: Diagnosis not present

## 2016-07-10 DIAGNOSIS — Z853 Personal history of malignant neoplasm of breast: Secondary | ICD-10-CM | POA: Insufficient documentation

## 2016-07-10 DIAGNOSIS — I493 Ventricular premature depolarization: Secondary | ICD-10-CM | POA: Diagnosis not present

## 2016-07-10 DIAGNOSIS — R079 Chest pain, unspecified: Secondary | ICD-10-CM | POA: Diagnosis not present

## 2016-07-10 DIAGNOSIS — Z170 Estrogen receptor positive status [ER+]: Secondary | ICD-10-CM | POA: Diagnosis not present

## 2016-07-10 DIAGNOSIS — R0789 Other chest pain: Secondary | ICD-10-CM | POA: Diagnosis not present

## 2016-07-10 LAB — COMPREHENSIVE METABOLIC PANEL
ALBUMIN: 3.5 g/dL (ref 3.5–5.0)
ALK PHOS: 71 U/L (ref 38–126)
ALT: 13 U/L — AB (ref 14–54)
AST: 20 U/L (ref 15–41)
Anion gap: 6 (ref 5–15)
BILIRUBIN TOTAL: 0.6 mg/dL (ref 0.3–1.2)
BUN: 8 mg/dL (ref 6–20)
CALCIUM: 9.4 mg/dL (ref 8.9–10.3)
CO2: 25 mmol/L (ref 22–32)
CREATININE: 0.78 mg/dL (ref 0.44–1.00)
Chloride: 107 mmol/L (ref 101–111)
GFR calc Af Amer: >60 mL/min (ref >60)
GFR calc non Af Amer: >60 mL/min (ref >60)
GLUCOSE: 108 mg/dL — AB (ref 65–99)
POTASSIUM: 3.8 mmol/L (ref 3.5–5.1)
Sodium: 138 mmol/L (ref 135–145)
TOTAL PROTEIN: 7.3 g/dL (ref 6.5–8.1)

## 2016-07-10 LAB — CBC
HEMATOCRIT: 35.6 % — AB (ref 36.0–46.0)
HEMOGLOBIN: 11.8 g/dL — AB (ref 12.0–15.0)
MCH: 31.2 pg (ref 26.0–34.0)
MCHC: 33.1 g/dL (ref 30.0–36.0)
MCV: 94.2 fL (ref 78.0–100.0)
Platelets: 213 10*3/uL (ref 150–400)
RBC: 3.78 MIL/uL — AB (ref 3.87–5.11)
RDW: 12.6 % (ref 11.5–15.5)
WBC: 5.5 10*3/uL (ref 4.0–10.5)

## 2016-07-10 LAB — LIPASE, BLOOD: LIPASE: 34 U/L (ref 11–51)

## 2016-07-10 LAB — I-STAT TROPONIN, ED: Troponin i, poc: 0 ng/mL (ref 0.00–0.08)

## 2016-07-10 IMAGING — DX DG CHEST 2V
2 series · 2 of 2 positions shown · non-contrast
Comparison: [DATE] chest radiograph

CLINICAL DATA: 65 y/o  F; mid chest pain and irregular heartbeat.

EXAM:
CHEST  2 VIEW

[chest pa]
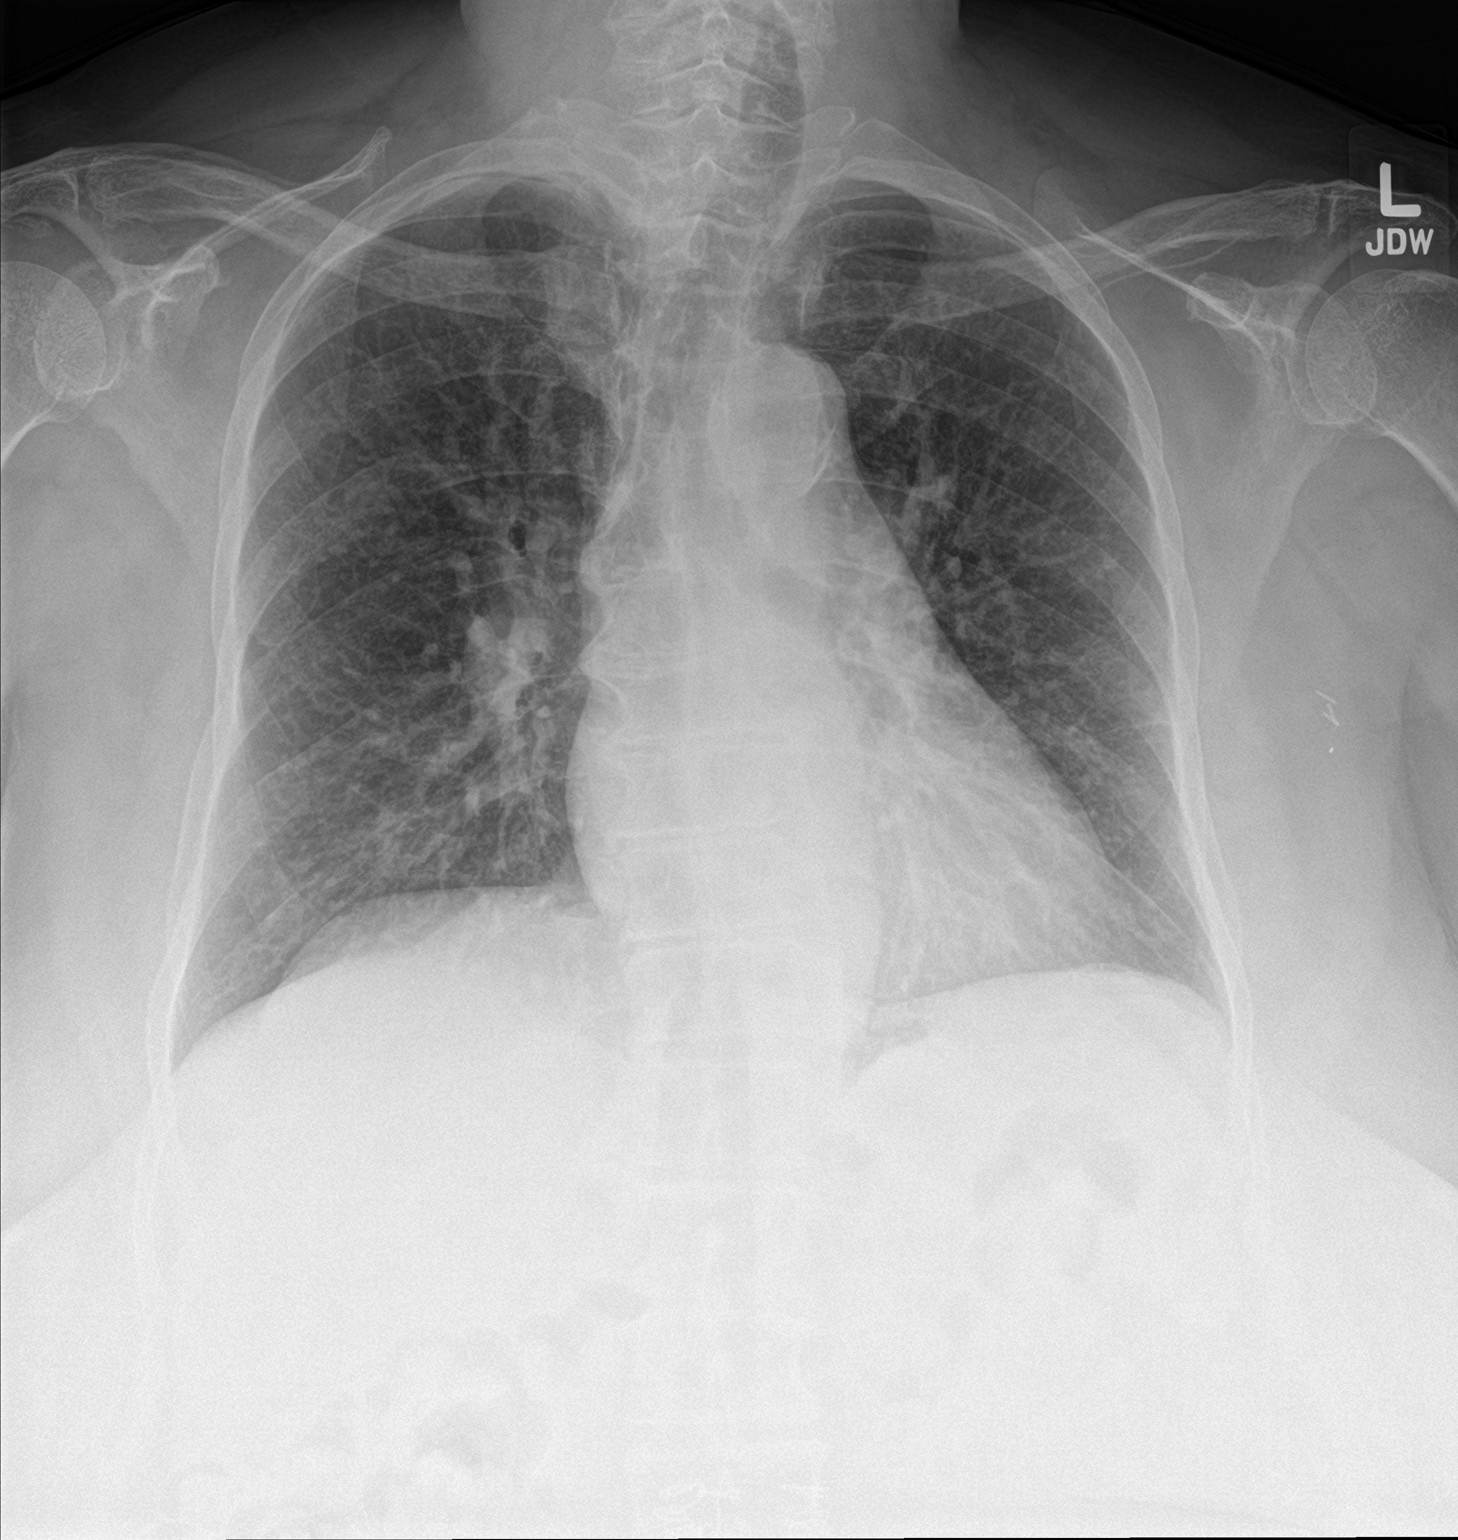

[chest lat]
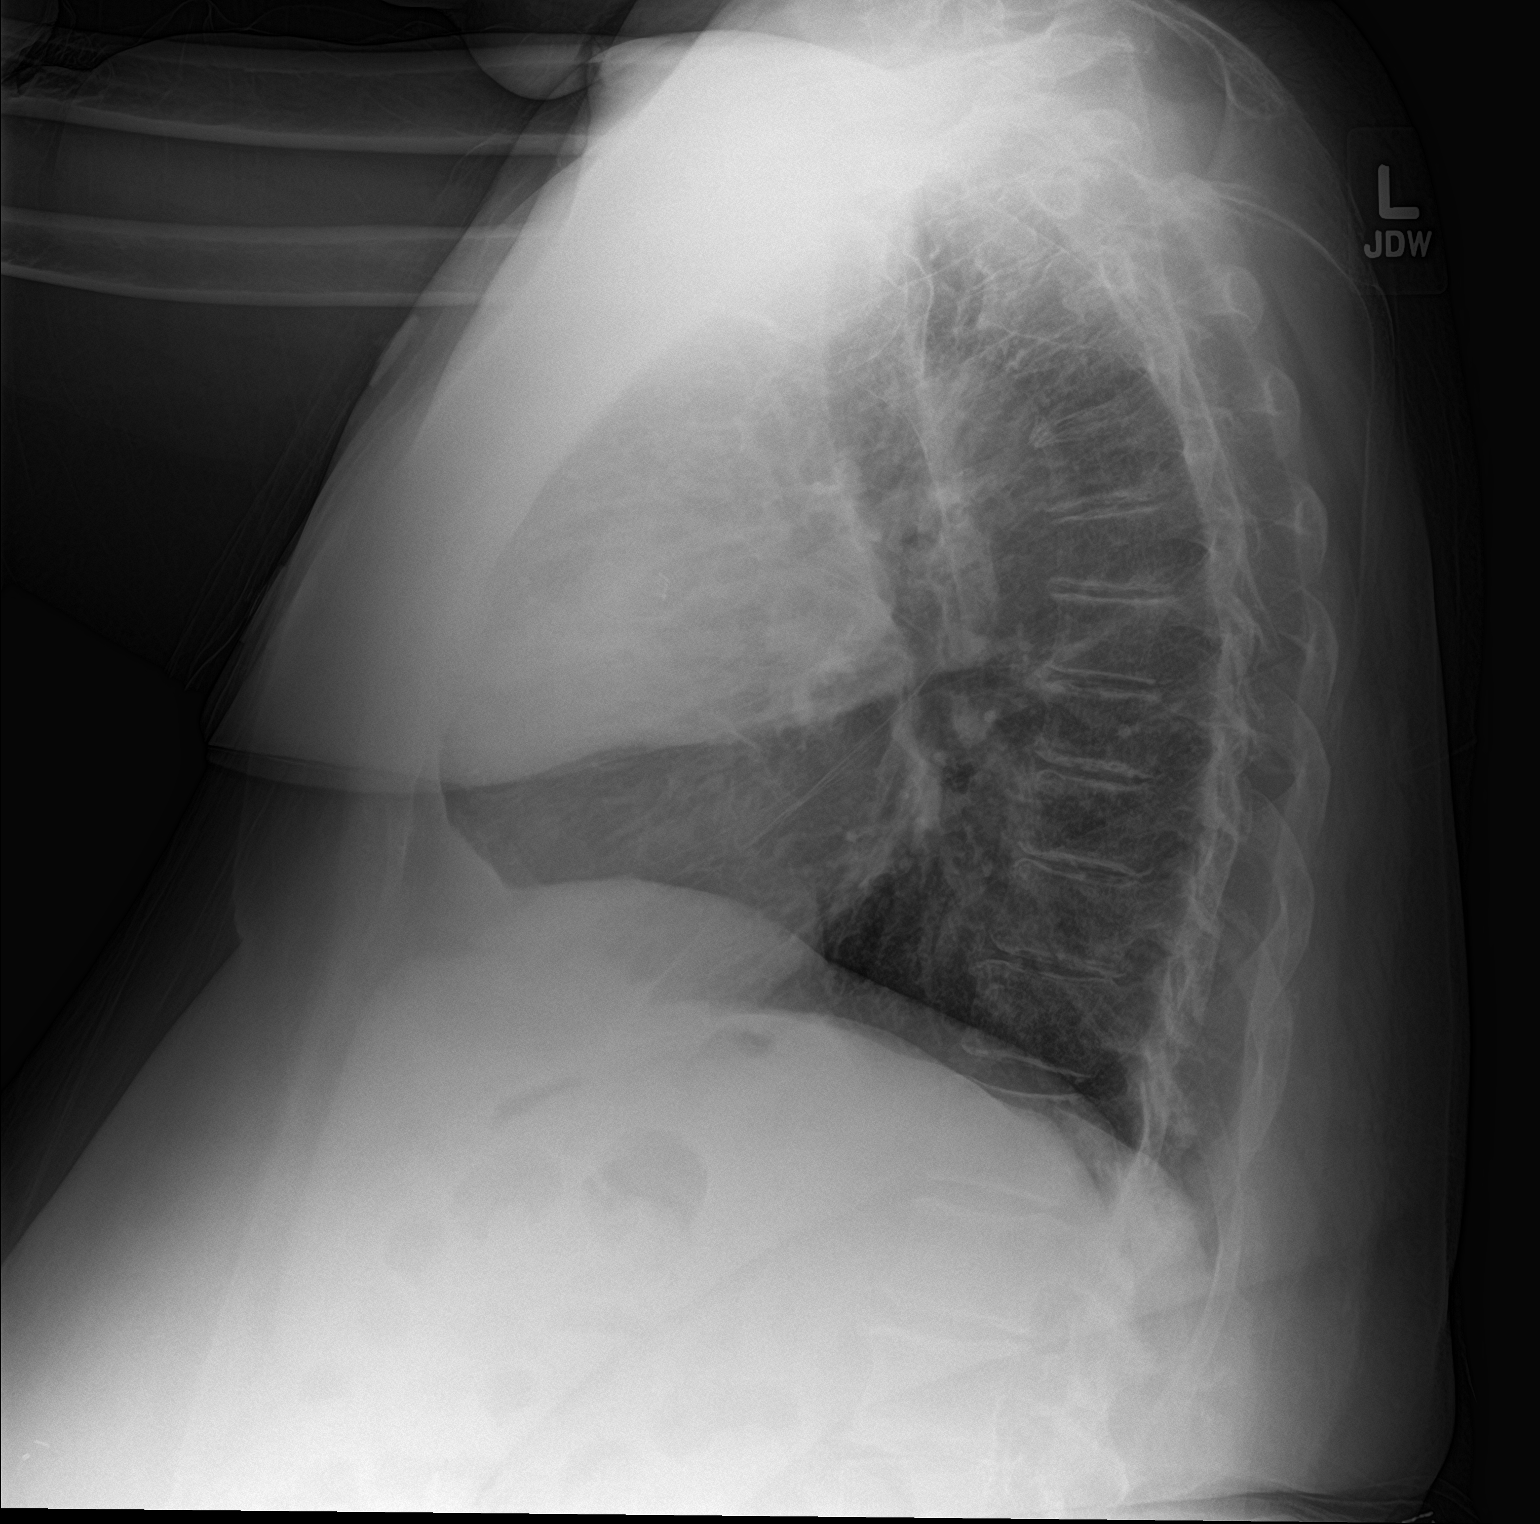

[2 of 2 positions shown; findings below may reference images not displayed]

FINDINGS: Stable cardiac silhouette within normal limits. Aortic
atherosclerosis with arch calcification. Clear lungs. No
pneumothorax or pleural effusion. Left axillary surgical clips.
Multilevel degenerative changes the spine.
IMPRESSION: No active cardiopulmonary disease.

By: OSIEL M.D.

## 2016-07-10 NOTE — ED Triage Notes (Signed)
Pt is a poor historian. States she has heart palpitations and chest pain on the right side. Pt also states it travels down her arms. Pt in NAD at this time. VSS. Also now endorsing abdominal pain.

## 2016-07-11 ENCOUNTER — Emergency Department (HOSPITAL_COMMUNITY)
Admission: EM | Admit: 2016-07-11 | Discharge: 2016-07-11 | Disposition: A | Discharge status: Home / Self Care | Payer: Medicare Other | Attending: Emergency Medicine | Admitting: Emergency Medicine

## 2016-07-11 ENCOUNTER — Ambulatory Visit: Payer: Medicare Other

## 2016-07-11 DIAGNOSIS — R079 Chest pain, unspecified: Secondary | ICD-10-CM

## 2016-07-11 DIAGNOSIS — R002 Palpitations: Secondary | ICD-10-CM

## 2016-07-11 DIAGNOSIS — I493 Ventricular premature depolarization: Secondary | ICD-10-CM

## 2016-07-11 NOTE — ED Provider Notes (Signed)
[facility] DEPT Provider Note   CSN: [id number] Arrival date & time: 07/10/16  2245     History   Chief Complaint Chief Complaint  Patient presents with  . Palpitations  . Chest Pain    HPI Sheila Jacobs is a 65 y.o. female.  Patient presents to the emergency department intermittent sharp chest discomfort with associated palpitations.  She states she feels a skipped beat and feels a sharp pain.  Both of her symptoms are transient.  No syncope or near syncope.  Denies chest pressure or neck pain.  No arm pain.  Denies back pain.  No associated shortness of breath or diaphoresis.  At this time patient still having occasional PVCs on the monitor which the patient can feel.  She has no other significant complaints at this time.  Denies abdominal pain at this time.  Denies nausea vomiting diarrhea.  Nursing note reports abdominal pain but the patient reports this has since resolved.   The history is provided by the patient.    Past Medical History:  Diagnosis Date  . Anxiety   . Arthritis   . Breast cancer of upper-outer quadrant of left female breast ([place]) 01/31/2016  . Colon polyps   . Enlarged LA (left atrium)   . GERD (gastroesophageal reflux disease)   . Hard of hearing    Wears hearing aids.  . Headache   . Heavy cigarette smoker    Quit 2015  . Hyperlipidemia   . Hypertension   . Morbid obesity ([place])   . Numbness and tingling   . Obesity   . Thyroid goiter   . Vertigo    on occasion    Patient Active Problem List   Diagnosis Date Noted  . Abnormal TSH 05/09/2016  . Paresthesia 01/31/2016  . Abnormality of gait 01/31/2016  . Low back pain 01/31/2016  . Breast cancer of upper-outer quadrant of left female breast ([place]) 01/31/2016  . Former heavy cigarette smoker (20-39 per day) 11/08/2015  . Goiter 11/08/2015  . Esophageal reflux   . Special screening for malignant neoplasms, colon   . Hearing loss 10/07/2015  . GERD (gastroesophageal reflux disease)  05/03/2015  . Dependent edema 05/03/2015  . Morbid obesity ([place]) 04/29/2015  . Right carotid bruit 10/06/2014  . Hypokalemia   . Hypomagnesemia   . Atypical chest pain 09/18/2014  . Hypertension 09/18/2014  . Hyperlipidemia 09/18/2014  . Enlarged LA (left atrium)   . Chest pain, precordial 09/02/2014    Past Surgical History:  Procedure Laterality Date  . BREAST LUMPECTOMY WITH RADIOACTIVE SEED AND SENTINEL LYMPH NODE BIOPSY Left 04/11/2016   Procedure: BREAST LUMPECTOMY WITH RADIOACTIVE SEED AND SENTINEL LYMPH NODE BIOPSY;  Surgeon: Excell Seltzer, MD;  Location: [facility];  Service: General;  Laterality: Left;  . COLONOSCOPY WITH PROPOFOL N/A 10/20/2015   Procedure: COLONOSCOPY WITH PROPOFOL;  Surgeon: Mauri Pole, MD;  Location: [facility];  Service: Endoscopy;  Laterality: N/A;  . ESOPHAGOGASTRODUODENOSCOPY (EGD) WITH PROPOFOL N/A 10/20/2015   Procedure: ESOPHAGOGASTRODUODENOSCOPY (EGD) WITH PROPOFOL;  Surgeon: Mauri Pole, MD;  Location: [facility];  Service: Endoscopy;  Laterality: N/A;  . LEFT HEART CATHETERIZATION WITH CORONARY ANGIOGRAM N/A 10/21/2014   Procedure: LEFT HEART CATHETERIZATION WITH CORONARY ANGIOGRAM;  Surgeon: Burnell Blanks, MD;  Location[facility] CATH LAB;  Service: Cardiovascular;  Laterality: N/A;  . TUBAL LIGATION      OB History    No data available       Home Medications  Prior to Admission medications   Medication Sig Start Date End Date Taking? Authorizing Provider  ALPRAZolam (XANAX) 0.25 MG tablet Take 1 tablet (0.25 mg total) by mouth 2 (two) times daily as needed for anxiety. 01/03/16   Girtha Rm, NP  aspirin EC 81 MG tablet Take 81 mg by mouth daily.    Historical Provider, MD  calcium citrate-vitamin D (CITRACAL+D) 315-200 MG-UNIT per tablet Take 1 tablet by mouth daily.    Historical Provider, MD  emollient (RADIAGEL) gel Apply topically as needed for wound care.    Historical Provider, MD  hydrochlorothiazide  (HYDRODIURIL) 25 MG tablet Take 0.5 tablets (12.5 mg total) by mouth daily. 02/14/16   Camelia Eng Tysinger, PA-C  ketoconazole (NIZORAL) 2 % cream  06/09/16   Historical Provider, MD  omeprazole (PRILOSEC) 40 MG capsule Take 1 capsule (40 mg total) by mouth daily. 02/14/16   Camelia Eng Tysinger, PA-C  potassium chloride SA (K-DUR,KLOR-CON) 20 MEQ tablet Take 1 tablet (20 mEq total) by mouth daily. 02/14/16   Carlena Hurl, PA-C    Family History Family History  Problem Relation Age of Onset  . Hypertension Mother   . Heart disease Mother     Pacemaker  . Kidney disease Mother   . Cataracts Mother   . Hearing loss Mother   . Diabetes Mother   . Liver disease Mother   . Diabetes Sister   . Cataracts Brother   . Hearing loss Brother   . Colon cancer Neg Hx   . Thyroid disease Neg Hx     Social History Social History  Substance Use Topics  . Smoking status: Former Smoker    Packs/day: 1.00    Years: 45.00    Types: Cigarettes    Quit date: 07/11/2014  . Smokeless tobacco: Never Used  . Alcohol use No     Allergies   Cortisone   Review of Systems Review of Systems  All other systems reviewed and are negative.    Physical Exam Updated Vital Signs BP 127/69   Pulse 71   Temp 97.8 F (36.6 C) (Oral)   Resp 14   SpO2 100%   Physical Exam  Constitutional: She is oriented to person, place, and time. She appears well-developed and well-nourished. No distress.  HENT:  Head: Normocephalic and atraumatic.  Eyes: EOM are normal.  Neck: Normal range of motion.  Cardiovascular: Normal rate, regular rhythm and normal heart sounds.   Pulmonary/Chest: Effort normal and breath sounds normal.  Abdominal: Soft. She exhibits no distension. There is no tenderness.  Musculoskeletal: Normal range of motion.  Neurological: She is alert and oriented to person, place, and time.  Skin: Skin is warm and dry.  Psychiatric: She has a normal mood and affect. Judgment normal.  Nursing note and  vitals reviewed.    ED Treatments / Results  Labs (all labs ordered are listed, but only abnormal results are displayed) Labs Reviewed  CBC - Abnormal; Notable for the following:       Result Value   RBC 3.78 (*)    Hemoglobin 11.8 (*)    HCT 35.6 (*)    All other components within normal limits  COMPREHENSIVE METABOLIC PANEL - Abnormal; Notable for the following:    Glucose, Bld 108 (*)    ALT 13 (*)    All other components within normal limits  LIPASE, BLOOD  I-STAT TROPOININ, ED    EKG  EKG Interpretation  Date/Time:  Tuesday July 10 2016  22:52:48 EDT Ventricular Rate:  88 PR Interval:  176 QRS Duration: 90 QT Interval:  372 QTC Calculation: 450 R Axis:   -52 Text Interpretation:  Sinus rhythm with occasional Premature ventricular complexes Left anterior fascicular block Abnormal ECG PVCs noted Otherwise no significant change Confirmed by Elizardo Chilson  MD, Lennette Bihari ([id number]) on 07/11/2016 3:01:44 AM       Radiology Dg Chest 2 View  Result Date: 07/10/2016 CLINICAL DATA:  65 y/o  F; mid chest pain and irregular heartbeat. EXAM: CHEST  2 VIEW COMPARISON:  02/14/2016 chest radiograph FINDINGS: Stable cardiac silhouette within normal limits. Aortic atherosclerosis with arch calcification. Clear lungs. No pneumothorax or pleural effusion. Left axillary surgical clips. Multilevel degenerative changes the spine. IMPRESSION: No active cardiopulmonary disease. Electronically Signed   By: Kristine Garbe M.D.   On: 07/10/2016 23:18    Procedures Procedures (including critical care time)  Medications Ordered in ED Medications - No data to display   Initial Impression / Assessment and Plan / ED Course  I have reviewed the triage vital signs and the nursing notes.  Pertinent labs & imaging results that were available during my care of the patient were reviewed by me and considered in my medical decision making (see chart for details).  Clinical Course    Occasional  PVCs.  Overall well-appearing.  Nontender abdominal examination.  Patient can follow-up as an outpatient with cardiology for her palpitations.  Electrolytes without significant abnormalities.  Primary care follow-up.  She understands to return to the ER for new or worsening symptoms.  Doubt ACS.  Doubt PE.  Final Clinical Impressions(s) / ED Diagnoses   Final diagnoses:  Chest pain, unspecified type  Palpitations  PVC's (premature ventricular contractions)    New Prescriptions New Prescriptions   No medications on file     Jola Schmidt, MD 07/11/16 [phone]

## 2016-07-12 ENCOUNTER — Ambulatory Visit
Admission: RE | Admit: 2016-07-12 | Discharge: 2016-07-12 | Disposition: A | Discharge status: Home / Self Care | Payer: Medicare Other | Source: Ambulatory Visit | Attending: Radiation Oncology | Admitting: Radiation Oncology

## 2016-07-12 DIAGNOSIS — Z510 Encounter for antineoplastic radiation therapy: Secondary | ICD-10-CM | POA: Diagnosis not present

## 2016-07-12 DIAGNOSIS — C50412 Malignant neoplasm of upper-outer quadrant of left female breast: Secondary | ICD-10-CM | POA: Diagnosis not present

## 2016-07-12 DIAGNOSIS — Z170 Estrogen receptor positive status [ER+]: Secondary | ICD-10-CM | POA: Diagnosis not present

## 2016-07-13 ENCOUNTER — Ambulatory Visit
Admission: RE | Admit: 2016-07-13 | Discharge: 2016-07-13 | Disposition: A | Discharge status: Home / Self Care | Payer: Medicare Other | Source: Ambulatory Visit | Attending: Radiation Oncology | Admitting: Radiation Oncology

## 2016-07-13 DIAGNOSIS — C50412 Malignant neoplasm of upper-outer quadrant of left female breast: Secondary | ICD-10-CM | POA: Diagnosis not present

## 2016-07-13 DIAGNOSIS — Z170 Estrogen receptor positive status [ER+]: Secondary | ICD-10-CM | POA: Diagnosis not present

## 2016-07-13 DIAGNOSIS — Z510 Encounter for antineoplastic radiation therapy: Secondary | ICD-10-CM | POA: Diagnosis not present

## 2016-07-16 ENCOUNTER — Ambulatory Visit
Admission: RE | Admit: 2016-07-16 | Discharge: 2016-07-16 | Disposition: A | Discharge status: Home / Self Care | Payer: Medicare Other | Source: Ambulatory Visit | Attending: Radiation Oncology | Admitting: Radiation Oncology

## 2016-07-16 ENCOUNTER — Encounter: Payer: Self-pay | Admitting: Radiation Oncology

## 2016-07-16 ENCOUNTER — Telehealth: Payer: Self-pay | Admitting: Family Medicine

## 2016-07-16 VITALS — BP 136/76 | HR 78 | Temp 98.0°F | Ht 64.0 in | Wt 293.6 lb

## 2016-07-16 DIAGNOSIS — R079 Chest pain, unspecified: Secondary | ICD-10-CM

## 2016-07-16 DIAGNOSIS — Z510 Encounter for antineoplastic radiation therapy: Secondary | ICD-10-CM | POA: Diagnosis not present

## 2016-07-16 DIAGNOSIS — C50412 Malignant neoplasm of upper-outer quadrant of left female breast: Secondary | ICD-10-CM

## 2016-07-16 DIAGNOSIS — Z170 Estrogen receptor positive status [ER+]: Principal | ICD-10-CM

## 2016-07-16 NOTE — Telephone Encounter (Signed)
Pt called and stated she needed a referral to her heart doc. She states she sees [facility] on [address]. Pt can be reached at [phone].

## 2016-07-16 NOTE — Progress Notes (Addendum)
Ms. Sheila Jacobs presents for her 18th fraction of radiation to her Left Breast. She denies pain at this time. She did present to the Emergency Dept last week for chest pain, but tells me a heart attack was ruled out. She will see a cardiologist when a referral is made. She denies fatigue. Her Left Breast is hyperpigmented, but no peeling is noted. She is using the radiaplex cream twice daily as directed.  BP 136/76   Pulse 78   Temp 98 F (36.7 C)   Ht 5\' 4"  (1.626 m)   Wt 293 lb 9.6 oz (133.2 kg)   SpO2 98% Comment: room air  BMI 50.40 kg/m    Wt Readings from Last 3 Encounters:  07/16/16 293 lb 9.6 oz (133.2 kg)  07/09/16 287 lb 3.2 oz (130.3 kg)  07/02/16 295 lb 3.2 oz (133.9 kg)

## 2016-07-16 NOTE — Progress Notes (Signed)
   Weekly Management Note:  outpatient    ICD-9-CM ICD-10-CM   1. Malignant neoplasm of upper-outer quadrant of left breast in female, estrogen receptor positive (HCC) 174.4 C50.412    V86.0 Z17.0     Current Dose: 32.4 Gy  Projected Dose: 51 Gy   Narrative:  The patient presents for routine under treatment assessment.  CBCT/MVCT images/Port film x-rays were reviewed.  The chart was checked. Darker skin over left breast noted  Physical Findings:  height is 5\' 4"  (1.626 m) and weight is 293 lb 9.6 oz (133.2 kg). Her temperature is 98 F (36.7 C). Her blood pressure is 136/76 and her pulse is 78. Her oxygen saturation is 98%.   Wt Readings from Last 3 Encounters:  07/16/16 293 lb 9.6 oz (133.2 kg)  07/09/16 287 lb 3.2 oz (130.3 kg)  07/02/16 295 lb 3.2 oz (133.9 kg)   NAD, slight hyperpigmentation over left breast - more so in IM fold; skin intact, stable exam from last week  Impression:  The patient is tolerating radiotherapy.  Plan:  Continue radiotherapy as planned. Patient instructed to continue to apply Radiplex to intact skin in treatment fields  ________________________________   Eppie Gibson, M.D.

## 2016-07-16 NOTE — Telephone Encounter (Signed)
Pt was seen for chest pain in ED the other night and needs a referral for cardiologist. I have put the referral ina dn pt is aware that they will contact her

## 2016-07-17 ENCOUNTER — Ambulatory Visit
Admission: RE | Admit: 2016-07-17 | Discharge: 2016-07-17 | Disposition: A | Discharge status: Home / Self Care | Payer: Medicare Other | Source: Ambulatory Visit | Attending: Radiation Oncology | Admitting: Radiation Oncology

## 2016-07-17 ENCOUNTER — Ambulatory Visit: Payer: Self-pay | Admitting: Nurse Practitioner

## 2016-07-17 DIAGNOSIS — C50412 Malignant neoplasm of upper-outer quadrant of left female breast: Secondary | ICD-10-CM | POA: Diagnosis not present

## 2016-07-17 DIAGNOSIS — Z170 Estrogen receptor positive status [ER+]: Secondary | ICD-10-CM | POA: Diagnosis not present

## 2016-07-17 DIAGNOSIS — Z510 Encounter for antineoplastic radiation therapy: Secondary | ICD-10-CM | POA: Diagnosis not present

## 2016-07-17 NOTE — Progress Notes (Deleted)
CARDIOLOGY OFFICE NOTE  Date:  07/17/2016    [name] Date of Birth: [birth year] Medical Record [id number]  PCP:  Harland Dingwall, NP  Cardiologist:  Hochrein   No chief complaint on file.   History of Present Illness: Sheila Jacobs is a 65 y.o. female who presents today for a work in visit. Seen for Dr. [name].   She was last seen by Dr. [name] back in may of 2016 - had had chest pain - abnormal Myoview but normal cardiac catheterization. She has a history of former tobacco abuse, HTN, HLD and obesity.   Currently receiving radiation for breast cancer. ER visit earlier this month for chest pain/palpitations. Occasional PVCs noted on the monitor.   Comes in today. Here with   Past Medical History:  Diagnosis Date  . Anxiety   . Arthritis   . Breast cancer of upper-outer quadrant of left female breast ([place]) 01/31/2016  . Colon polyps   . Enlarged LA (left atrium)   . GERD (gastroesophageal reflux disease)   . Hard of hearing    Wears hearing aids.  . Headache   . Heavy cigarette smoker    Quit 2015  . Hyperlipidemia   . Hypertension   . Morbid obesity ([place])   . Numbness and tingling   . Obesity   . Thyroid goiter   . Vertigo    on occasion    Past Surgical History:  Procedure Laterality Date  . BREAST LUMPECTOMY WITH RADIOACTIVE SEED AND SENTINEL LYMPH NODE BIOPSY Left 04/11/2016   Procedure: BREAST LUMPECTOMY WITH RADIOACTIVE SEED AND SENTINEL LYMPH NODE BIOPSY;  Surgeon: Excell Seltzer, MD;  Location: [facility];  Service: General;  Laterality: Left;  . COLONOSCOPY WITH PROPOFOL N/A 10/20/2015   Procedure: COLONOSCOPY WITH PROPOFOL;  Surgeon: Mauri Pole, MD;  Location: [facility];  Service: Endoscopy;  Laterality: N/A;  . ESOPHAGOGASTRODUODENOSCOPY (EGD) WITH PROPOFOL N/A 10/20/2015   Procedure: ESOPHAGOGASTRODUODENOSCOPY (EGD) WITH PROPOFOL;  Surgeon: Mauri Pole, MD;  Location: [facility];  Service: Endoscopy;  Laterality: N/A;    . LEFT HEART CATHETERIZATION WITH CORONARY ANGIOGRAM N/A 10/21/2014   Procedure: LEFT HEART CATHETERIZATION WITH CORONARY ANGIOGRAM;  Surgeon: Burnell Blanks, MD;  Location: [facility] C[facility];  Service: Cardiovascular;  Laterality: N/A;  . TUBAL LIGATION       Medications: Current Outpatient Prescriptions  Medication Sig Dispense Refill  . ALPRAZolam (XANAX) 0.25 MG tablet Take 1 tablet (0.25 mg total) by mouth 2 (two) times daily as needed for anxiety. 60 tablet 0  . aspirin EC 81 MG tablet Take 81 mg by mouth daily.    . calcium citrate-vitamin D (CITRACAL+D) 315-200 MG-UNIT per tablet Take 1 tablet by mouth daily.    Marland Kitchen emollient (RADIAGEL) gel Apply topically as needed for wound care.    . hydrochlorothiazide (HYDRODIURIL) 25 MG tablet Take 0.5 tablets (12.5 mg total) by mouth daily. 90 tablet 1  . ketoconazole (NIZORAL) 2 % cream     . omeprazole (PRILOSEC) 40 MG capsule Take 1 capsule (40 mg total) by mouth daily. 90 capsule 1  . potassium chloride SA (K-DUR,KLOR-CON) 20 MEQ tablet Take 1 tablet (20 mEq total) by mouth daily. 90 tablet 1   No current facility-administered medications for this visit.     Allergies: Allergies  Allergen Reactions  . Cortisone Rash    Social History: The patient  reports that she quit smoking about 2 years ago. Her smoking use included Cigarettes. She has  a 45.00 pack-year smoking history. She has never used smokeless tobacco. She reports that she does not drink alcohol or use drugs.   Family History: The patient's ***family history includes Cataracts in her brother and mother; Diabetes in her mother and sister; Hearing loss in her brother and mother; Heart disease in her mother; Hypertension in her mother; Kidney disease in her mother; Liver disease in her mother.   Review of Systems: Please see the history of present illness.   Otherwise, the review of systems is positive for {NONE DEFAULTED:18576::"none"}.   All other systems are reviewed and  negative.   Physical Exam: VS:  There were no vitals taken for this visit. Marland Kitchen  BMI There is no height or weight on file to calculate BMI.  Wt Readings from Last 3 Encounters:  07/16/16 293 lb 9.6 oz (133.2 kg)  07/09/16 287 lb 3.2 oz (130.3 kg)  07/02/16 295 lb 3.2 oz (133.9 kg)    General: Pleasant. Well developed, well nourished and in no acute distress.   HEENT: Normal.  Neck: Supple, no JVD, carotid bruits, or masses noted.  Cardiac: ***Regular rate and rhythm. No murmurs, rubs, or gallops. No edema.  Respiratory:  Lungs are clear to auscultation bilaterally with normal work of breathing.  GI: Soft and nontender.  MS: No deformity or atrophy. Gait and ROM intact.  Skin: Warm and dry. Color is normal.  Neuro:  Strength and sensation are intact and no gross focal deficits noted.  Psych: Alert, appropriate and with normal affect.   LABORATORY DATA:  EKG:  EKG {ACTION; IS/IS GI:[id number] ordered today. This demonstrates ***.  Lab Results  Component Value Date   WBC 5.5 07/10/2016   HGB 11.8 (L) 07/10/2016   HCT 35.6 (L) 07/10/2016   PLT 213 07/10/2016   GLUCOSE 108 (H) 07/10/2016   CHOL 174 05/03/2015   TRIG 65 05/03/2015   HDL 61 05/03/2015   LDLCALC 100 05/03/2015   ALT 13 (L) 07/10/2016   AST 20 07/10/2016   NA 138 07/10/2016   K 3.8 07/10/2016   CL 107 07/10/2016   CREATININE 0.78 07/10/2016   BUN 8 07/10/2016   CO2 25 07/10/2016   TSH 0.25 (L) 05/09/2016   INR 0.95 10/19/2014   HGBA1C 5.3 04/24/2016    BNP (last 3 results) No results for input(s): BNP in the last 8760 hours.  ProBNP (last 3 results) No results for input(s): PROBNP in the last 8760 hours.   Other Studies Reviewed Today:  Cardiac Cath 10/2014 Angiographic Findings:  Left main: This vessel has an unusual proximal segment but appears to be widely patent. There is no dampening of the catheter on engagement. The cranial shots demonstrate good reflux around the catheter with streaming of  contrast due to the shape of the vessel. The ostium of this vessel does not appear to have atherosclerotic plaque.   Left Anterior Descending Artery: Large caliber vessel that courses to the apex. There is a small caliber diagonal branch with no obstructive disease.   Circumflex Artery: Large caliber vessel with two moderate caliber obtuse marginal branches. No obstructive disease.   Right Coronary Artery: Small non-dominant vessel with no obstructive disease.   Left Ventricular Angiogram: LVEF=65%.   Impression: 1. No angiographic evidence of CAD 2. Normal LV systolic function 3. Non-cardiac chest pain  Recommendations: No further ischemic workup.        Complications:  None. The patient tolerated the procedure well.  Echo Study Conclusions from 09/2014  - Left ventricle: The cavity size was normal. Systolic function was normal. The estimated ejection fraction was in the range of 60% to 65%. Wall motion was normal; there were no regional wall motion abnormalities. Indeterminate diastolic function. Normal filling pressures. Mild focal basal septal hypertrophy. - Aortic valve: Trileaflet; mildly thickened leaflets. - Mitral valve: There was mild regurgitation. - Left atrium: The atrium was severely dilated. Volume/bsa, S: 42.3 ml/m^2. - Right atrium: The atrium was mildly dilated. - Tricuspid valve: There was mild regurgitation. - Inferior vena cava: The vessel was small, appearing collapsed, consistent with low central venous pressure.  Assessment/Plan: 1. Chest pain  2. Palpitations  3. Prior normal cardiac cath.  4. Breast cancer  Current medicines are reviewed with the patient today.  The patient does not have concerns regarding medicines other than what has been noted above.  The following changes have been made:  See above.  Labs/ tests ordered today include:   No orders of the defined types were placed in this  encounter.    Disposition:   FU with *** in {gen number [id number] {Days to years:10300}.   Patient is agreeable to this plan and will call if any problems develop in the interim.   Signed: Burtis Junes, RN, ANP-C 07/17/2016 12:23 PM  [facility] [address] [place], [place]  [address] Phone: [phone] Fax: [phone]

## 2016-07-18 ENCOUNTER — Ambulatory Visit
Admission: RE | Admit: 2016-07-18 | Discharge: 2016-07-18 | Disposition: A | Discharge status: Home / Self Care | Payer: Medicare Other | Source: Ambulatory Visit | Attending: Radiation Oncology | Admitting: Radiation Oncology

## 2016-07-18 DIAGNOSIS — Z510 Encounter for antineoplastic radiation therapy: Secondary | ICD-10-CM | POA: Diagnosis not present

## 2016-07-18 DIAGNOSIS — Z170 Estrogen receptor positive status [ER+]: Secondary | ICD-10-CM | POA: Diagnosis not present

## 2016-07-18 DIAGNOSIS — C50412 Malignant neoplasm of upper-outer quadrant of left female breast: Secondary | ICD-10-CM | POA: Diagnosis not present

## 2016-07-19 ENCOUNTER — Encounter: Payer: Self-pay | Admitting: Radiation Oncology

## 2016-07-19 ENCOUNTER — Ambulatory Visit
Admission: RE | Admit: 2016-07-19 | Discharge: 2016-07-19 | Disposition: A | Discharge status: Home / Self Care | Payer: Medicare Other | Source: Ambulatory Visit | Attending: Radiation Oncology | Admitting: Radiation Oncology

## 2016-07-19 DIAGNOSIS — C50412 Malignant neoplasm of upper-outer quadrant of left female breast: Secondary | ICD-10-CM | POA: Diagnosis not present

## 2016-07-19 DIAGNOSIS — Z170 Estrogen receptor positive status [ER+]: Secondary | ICD-10-CM | POA: Diagnosis not present

## 2016-07-19 DIAGNOSIS — Z510 Encounter for antineoplastic radiation therapy: Secondary | ICD-10-CM | POA: Diagnosis not present

## 2016-07-20 ENCOUNTER — Ambulatory Visit
Admission: RE | Admit: 2016-07-20 | Discharge: 2016-07-20 | Disposition: A | Discharge status: Home / Self Care | Payer: Medicare Other | Source: Ambulatory Visit | Attending: Radiation Oncology | Admitting: Radiation Oncology

## 2016-07-20 DIAGNOSIS — C50412 Malignant neoplasm of upper-outer quadrant of left female breast: Secondary | ICD-10-CM | POA: Diagnosis not present

## 2016-07-20 DIAGNOSIS — Z170 Estrogen receptor positive status [ER+]: Secondary | ICD-10-CM | POA: Diagnosis not present

## 2016-07-20 DIAGNOSIS — Z510 Encounter for antineoplastic radiation therapy: Secondary | ICD-10-CM | POA: Diagnosis not present

## 2016-07-20 MED ORDER — RADIAPLEXRX EX GEL
Freq: Once | CUTANEOUS | Status: AC
  Administered 2016-07-20: 14:00:00 via TOPICAL

## 2016-07-20 MED ORDER — RADIAPLEXRX EX GEL: Freq: Once | CUTANEOUS | Status: DC

## 2016-07-23 ENCOUNTER — Ambulatory Visit
Admission: RE | Admit: 2016-07-23 | Discharge: 2016-07-23 | Disposition: A | Discharge status: Home / Self Care | Payer: Medicare Other | Source: Ambulatory Visit | Attending: Radiation Oncology | Admitting: Radiation Oncology

## 2016-07-23 ENCOUNTER — Encounter: Payer: Self-pay | Admitting: Radiation Oncology

## 2016-07-23 ENCOUNTER — Ambulatory Visit: Payer: Medicare Other

## 2016-07-23 ENCOUNTER — Other Ambulatory Visit: Payer: Self-pay | Admitting: Family Medicine

## 2016-07-23 VITALS — BP 122/64 | HR 59 | Temp 97.8°F | Ht 64.0 in | Wt 292.4 lb

## 2016-07-23 DIAGNOSIS — C50412 Malignant neoplasm of upper-outer quadrant of left female breast: Secondary | ICD-10-CM

## 2016-07-23 DIAGNOSIS — Z510 Encounter for antineoplastic radiation therapy: Secondary | ICD-10-CM | POA: Diagnosis not present

## 2016-07-23 DIAGNOSIS — Z170 Estrogen receptor positive status [ER+]: Principal | ICD-10-CM

## 2016-07-23 NOTE — Progress Notes (Signed)
   Weekly Management Note:  outpatient    ICD-9-CM ICD-10-CM   1. Malignant neoplasm of upper-outer quadrant of left breast in female, estrogen receptor positive (HCC) 174.4 C50.412    V86.0 Z17.0     Current Dose: 41.4 Gy  Projected Dose: 51 Gy   Narrative:  The patient presents for routine under treatment assessment.  CBCT/MVCT images/Port film x-rays were reviewed.  The chart was checked. She has noticed some slight superficial peeling at IM fold   Physical Findings:  height is 5\' 4"  (1.626 m) and weight is 292 lb 6.4 oz (132.6 kg). Her temperature is 97.8 F (36.6 C). Her blood pressure is 122/64 and her pulse is 59 (abnormal). Her oxygen saturation is 100%.   Wt Readings from Last 3 Encounters:  07/23/16 292 lb 6.4 oz (132.6 kg)  07/16/16 293 lb 9.6 oz (133.2 kg)  07/09/16 287 lb 3.2 oz (130.3 kg)   NAD, slight hyperpigmentation over left breast - more so in IM fold; skin notable in IM fold for superficial peeling  Impression:  The patient is tolerating radiotherapy.  Plan:  Continue radiotherapy as planned. Patient instructed to continue to apply Radiplex to intact skin in treatment fields and apply triple antibiotic ointment at IM fold if peeling increases.  ________________________________   Eppie Gibson, M.D.

## 2016-07-23 NOTE — Progress Notes (Signed)
Ms. Slacum is here for her 23rd fraction of radiation to her Left Breast. She reports some soreness to her Left Breast. Her Left Breast is hyperpigmented. She has some dry peeling to the Left Breast underneath her nipple area. She is using radiaplex daily and will begin using neosporin to the peeling area. She denies fatigue. She has no other concerns at this time.   BP 122/64   Pulse (!) 59   Temp 97.8 F (36.6 C)   Ht 5\' 4"  (1.626 m)   Wt 292 lb 6.4 oz (132.6 kg)   SpO2 100% Comment: room air  BMI 50.19 kg/m    Wt Readings from Last 3 Encounters:  07/23/16 292 lb 6.4 oz (132.6 kg)  07/16/16 293 lb 9.6 oz (133.2 kg)  07/09/16 287 lb 3.2 oz (130.3 kg)

## 2016-07-24 ENCOUNTER — Ambulatory Visit: Payer: Medicare Other

## 2016-07-24 ENCOUNTER — Ambulatory Visit
Admission: RE | Admit: 2016-07-24 | Discharge: 2016-07-24 | Disposition: A | Discharge status: Home / Self Care | Payer: Medicare Other | Source: Ambulatory Visit | Attending: Radiation Oncology | Admitting: Radiation Oncology

## 2016-07-24 DIAGNOSIS — Z170 Estrogen receptor positive status [ER+]: Secondary | ICD-10-CM | POA: Diagnosis not present

## 2016-07-24 DIAGNOSIS — C50412 Malignant neoplasm of upper-outer quadrant of left female breast: Secondary | ICD-10-CM | POA: Diagnosis not present

## 2016-07-24 DIAGNOSIS — Z923 Personal history of irradiation: Secondary | ICD-10-CM | POA: Diagnosis not present

## 2016-07-24 DIAGNOSIS — Z5189 Encounter for other specified aftercare: Secondary | ICD-10-CM | POA: Insufficient documentation

## 2016-07-24 NOTE — Telephone Encounter (Signed)
Called in med to pharmacy  

## 2016-07-24 NOTE — Telephone Encounter (Signed)
Ok to refill #30. No refills.

## 2016-07-24 NOTE — Telephone Encounter (Signed)
Is this okay to refill? 

## 2016-07-25 ENCOUNTER — Ambulatory Visit: Payer: Medicare Other

## 2016-07-25 ENCOUNTER — Ambulatory Visit
Admission: RE | Admit: 2016-07-25 | Discharge: 2016-07-25 | Disposition: A | Discharge status: Home / Self Care | Payer: Medicare Other | Source: Ambulatory Visit | Attending: Radiation Oncology | Admitting: Radiation Oncology

## 2016-07-25 DIAGNOSIS — Z5189 Encounter for other specified aftercare: Secondary | ICD-10-CM | POA: Diagnosis not present

## 2016-07-25 DIAGNOSIS — Z170 Estrogen receptor positive status [ER+]: Principal | ICD-10-CM

## 2016-07-25 DIAGNOSIS — Z923 Personal history of irradiation: Secondary | ICD-10-CM | POA: Diagnosis not present

## 2016-07-25 DIAGNOSIS — C50412 Malignant neoplasm of upper-outer quadrant of left female breast: Secondary | ICD-10-CM | POA: Diagnosis not present

## 2016-07-26 ENCOUNTER — Ambulatory Visit: Payer: Medicare Other

## 2016-07-26 ENCOUNTER — Ambulatory Visit
Admission: RE | Admit: 2016-07-26 | Discharge: 2016-07-26 | Disposition: A | Discharge status: Home / Self Care | Payer: Medicare Other | Source: Ambulatory Visit | Attending: Radiation Oncology | Admitting: Radiation Oncology

## 2016-07-26 DIAGNOSIS — Z170 Estrogen receptor positive status [ER+]: Secondary | ICD-10-CM | POA: Diagnosis not present

## 2016-07-26 DIAGNOSIS — Z5189 Encounter for other specified aftercare: Secondary | ICD-10-CM | POA: Diagnosis not present

## 2016-07-26 DIAGNOSIS — Z923 Personal history of irradiation: Secondary | ICD-10-CM | POA: Diagnosis not present

## 2016-07-26 DIAGNOSIS — C50412 Malignant neoplasm of upper-outer quadrant of left female breast: Secondary | ICD-10-CM | POA: Diagnosis not present

## 2016-07-27 ENCOUNTER — Ambulatory Visit
Admission: RE | Admit: 2016-07-27 | Discharge: 2016-07-27 | Disposition: A | Discharge status: Home / Self Care | Payer: Medicare Other | Source: Ambulatory Visit | Attending: Radiation Oncology | Admitting: Radiation Oncology

## 2016-07-27 ENCOUNTER — Ambulatory Visit: Payer: Medicare Other

## 2016-07-27 DIAGNOSIS — Z923 Personal history of irradiation: Secondary | ICD-10-CM | POA: Diagnosis not present

## 2016-07-27 DIAGNOSIS — Z170 Estrogen receptor positive status [ER+]: Secondary | ICD-10-CM | POA: Diagnosis not present

## 2016-07-27 DIAGNOSIS — Z5189 Encounter for other specified aftercare: Secondary | ICD-10-CM | POA: Diagnosis not present

## 2016-07-27 DIAGNOSIS — C50412 Malignant neoplasm of upper-outer quadrant of left female breast: Secondary | ICD-10-CM | POA: Diagnosis not present

## 2016-07-30 ENCOUNTER — Encounter: Payer: Self-pay | Admitting: Radiation Oncology

## 2016-07-30 ENCOUNTER — Ambulatory Visit
Admission: RE | Admit: 2016-07-30 | Discharge: 2016-07-30 | Disposition: A | Discharge status: Home / Self Care | Payer: Medicare Other | Source: Ambulatory Visit | Attending: Radiation Oncology | Admitting: Radiation Oncology

## 2016-07-30 ENCOUNTER — Telehealth: Payer: Self-pay | Admitting: *Deleted

## 2016-07-30 ENCOUNTER — Ambulatory Visit: Payer: Medicare Other

## 2016-07-30 VITALS — BP 128/64 | HR 74 | Temp 98.2°F | Ht 64.0 in | Wt 296.6 lb

## 2016-07-30 DIAGNOSIS — Z5189 Encounter for other specified aftercare: Secondary | ICD-10-CM | POA: Diagnosis not present

## 2016-07-30 DIAGNOSIS — C50412 Malignant neoplasm of upper-outer quadrant of left female breast: Secondary | ICD-10-CM | POA: Diagnosis not present

## 2016-07-30 DIAGNOSIS — Z923 Personal history of irradiation: Secondary | ICD-10-CM | POA: Diagnosis not present

## 2016-07-30 DIAGNOSIS — Z170 Estrogen receptor positive status [ER+]: Secondary | ICD-10-CM | POA: Diagnosis not present

## 2016-07-30 NOTE — Progress Notes (Signed)
Sheila Jacobs is here for her final treatment of radiation to her Left Breast. She denies pain, but does report occasional shooting pain in her Left upper Breast area, which come and go quickly. Her Left Breast is hyperpigmented. The area underneath her Left Breast that had peeled has improved with neosporin. She has several areas of dry peeling to the upper portion of her Left Breast from scratching. She is using neosporin to these areas. She continues to use Radiaplex to her Left Breast. She will switch to a vitamin E cream when completed. She was also given a one month follow up appointment today.  BP 128/64   Pulse 74   Temp 98.2 F (36.8 C)   Ht 5\' 4"  (1.626 m)   Wt 296 lb 9.6 oz (134.5 kg)   SpO2 100% Comment: room air  BMI 50.91 kg/m    Wt Readings from Last 3 Encounters:  07/30/16 296 lb 9.6 oz (134.5 kg)  07/23/16 292 lb 6.4 oz (132.6 kg)  07/16/16 293 lb 9.6 oz (133.2 kg)

## 2016-07-30 NOTE — Progress Notes (Addendum)
   Weekly Management Note:  outpatient    ICD-9-CM ICD-10-CM   1. Malignant neoplasm of upper-outer quadrant of left breast in female, estrogen receptor positive (HCC) 174.4 C50.412    V86.0 Z17.0     Current Dose: 51 Gy  Projected Dose: 51 Gy   Narrative:  The patient presents for routine under treatment assessment.  CBCT/MVCT images/Port film x-rays were reviewed.  The chart was checked. Notices itching of left breast  Physical Findings:  height is 5\' 4"  (1.626 m) and weight is 296 lb 9.6 oz (134.5 kg). Her temperature is 98.2 F (36.8 C). Her blood pressure is 128/64 and her pulse is 74. Her oxygen saturation is 100%.   Wt Readings from Last 3 Encounters:  07/30/16 296 lb 9.6 oz (134.5 kg)  07/23/16 292 lb 6.4 oz (132.6 kg)  07/16/16 293 lb 9.6 oz (133.2 kg)   NAD, moderate hyperpigmentation over left breast - punctate area of peeling over breast  Impression:  The patient has tolerated radiotherapy.  Plan:  Continue radiotherapy as planned. Patient instructed to continue to apply Radiplex to intact skin in treatment fields and apply triple antibiotic ointment if peeling increases. hydrocortisone 1% cream recommended over itching areas. F/u in 18mo. Card of congratulations given today.  ________________________________   Eppie Gibson, M.D.

## 2016-07-30 NOTE — Telephone Encounter (Signed)
  Oncology Nurse Navigator Documentation  Navigator Location: [facility]- (07/30/16 1100)   )Navigator Encounter Type: Telephone (07/30/16 1100) Telephone: [facility] (07/30/16 1100)                   Patient Visit Type: [id number] (07/30/16 1100) Treatment Phase: Final Radiation Tx (07/30/16 1100)                            Time Spent with Patient: 15 (07/30/16 1100)

## 2016-07-30 NOTE — Progress Notes (Addendum)
  Radiation Oncology         [phone] ________________________________  Name: Sheila Jacobs MRN: [id number]  Date: 07/30/2016  DOB: [birth year]  End of Treatment Note  Diagnosis: Stage IA [id number] left breast invasive ductal carcinoma ER / PR + HER2 neg, Grade 3  Indication for treatment: curative       Radiation treatment dates:   06-18-16 to 07-30-16  Site/dose:    1) Left Breast / 45 Gy in 25 fractions 2) Left Breast Boost /  6 Gy in 3 fractions  Beams/energy:  1) prone tangents, 3D conformal  / 10 MV 2) 3 field photons / 10  and 6MV  Narrative: The patient tolerated radiation treatment relatively well.      Plan: The patient has completed radiation treatment. The patient will return to radiation oncology clinic for routine followup in one month. I advised them to call or return sooner if they have any questions or concerns related to their recovery or treatment.  -----------------------------------  Eppie Gibson, MD

## 2016-07-31 ENCOUNTER — Encounter: Payer: Self-pay | Admitting: Cardiology

## 2016-07-31 ENCOUNTER — Encounter (INDEPENDENT_AMBULATORY_CARE_PROVIDER_SITE_OTHER): Payer: Self-pay

## 2016-07-31 ENCOUNTER — Ambulatory Visit (INDEPENDENT_AMBULATORY_CARE_PROVIDER_SITE_OTHER): Payer: Medicare Other | Admitting: Cardiology

## 2016-07-31 ENCOUNTER — Ambulatory Visit: Payer: Medicare Other

## 2016-07-31 VITALS — BP 100/58 | HR 82 | Ht 64.0 in | Wt 296.0 lb

## 2016-07-31 DIAGNOSIS — R0789 Other chest pain: Secondary | ICD-10-CM | POA: Diagnosis not present

## 2016-07-31 DIAGNOSIS — R609 Edema, unspecified: Secondary | ICD-10-CM

## 2016-07-31 LAB — BASIC METABOLIC PANEL
BUN: 10 mg/dL (ref 7–25)
CO2: 27 mmol/L (ref 20–31)
Calcium: 9.2 mg/dL (ref 8.6–10.4)
Chloride: 107 mmol/L (ref 98–110)
Creat: 0.75 mg/dL (ref 0.50–0.99)
Glucose, Bld: 109 mg/dL — ABNORMAL HIGH (ref 65–99)
Potassium: 3.9 mmol/L (ref 3.5–5.3)
Sodium: 139 mmol/L (ref 135–146)

## 2016-07-31 LAB — BRAIN NATRIURETIC PEPTIDE: Brain Natriuretic Peptide: 85.3 pg/mL (ref <100)

## 2016-07-31 MED ORDER — PANTOPRAZOLE SODIUM 40 MG PO TBEC: 40.0000 mg | DELAYED_RELEASE_TABLET | Freq: Every day | ORAL | 11 refills | Status: DC

## 2016-07-31 NOTE — Patient Instructions (Signed)
Medication Instruction Your physician has recommended you make the following change in your medication:  1) Stop Prilosec 2) Start Protonix 40 mg daily   Labwork: Your physician recommends that you return for lab work today: BMP/BNP   Testing/Procedures: None ordered   Follow-Up: Your physician recommends that you schedule a follow-up appointment as needed    Any Other Special Instructions Will Be Listed Below (If Applicable).     If you need a refill on your cardiac medications before your next appointment, please call your pharmacy.

## 2016-07-31 NOTE — Progress Notes (Signed)
07/31/2016 [name]   13-Nov-1950  [id number]  Primary Physician Harland Dingwall, NP Primary Cardiologist: Dr. [name]    Reason for Visit/CC: Atypical Chest Pain   HPI:   Ms. Sheila Jacobs is a 65 y/o obese female with h/o HTN, HLD tobacco abuse (quit in 2015) and left sided breast CA treated with radiation, presenting to clinic with a complaint of chest pain. Patient had a High risk stress nuclear study 10/14/2014 with a large, severe, reversible inferior defect consistent with severe inferior ischemia. On 10/21/14, she was referred for definitive LHC. This was performed by Dr. Angelena Form. She was found to have no angiographic evidence of CAD. She was noted to have normal LV systolic function. Her CP was ruled as non-cardiac (see full cath report below).   She presents with complaint of atypical chest pain. She notes waking up recently with a heavy "thump" in her chest that woke he from her sleep. Occurred once. Lasted only a split second. She denies any prolonged palpitations. No dyspnea. She also notes symptoms of chest discomfort after meals. She has been on Prilosec. This was increased by her PCP from 20 mg to 40 mg, but she continues to have chest discomfort sometimes after meals. She is morbidly obese. She is CP free currently.   EKG today shows NSR with PACs. No ischemia. HR is 82 bpm. BP is 100/58.    Current Meds  Medication Sig  . ALPRAZolam (XANAX) 0.25 MG tablet TAKE ONE TABLET BY MOUTH TWICE DAILY AS NEEDED FOR ANXIETY  . aspirin EC 81 MG tablet Take 81 mg by mouth daily.  . calcium citrate-vitamin D (CITRACAL+D) 315-200 MG-UNIT per tablet Take 1 tablet by mouth daily.  Marland Kitchen emollient (RADIAGEL) gel Apply topically as needed for wound care.  . hydrochlorothiazide (HYDRODIURIL) 25 MG tablet Take 0.5 tablets (12.5 mg total) by mouth daily.  Marland Kitchen ketoconazole (NIZORAL) 2 % cream   . omeprazole (PRILOSEC) 40 MG capsule Take 1 capsule (40 mg total) by mouth daily.  . potassium chloride  SA (K-DUR,KLOR-CON) 20 MEQ tablet Take 1 tablet (20 mEq total) by mouth daily.   Allergies  Allergen Reactions  . Cortisone Rash   Past Medical History:  Diagnosis Date  . Anxiety   . Arthritis   . Breast cancer of upper-outer quadrant of left female breast ([place]) 01/31/2016  . Colon polyps   . Enlarged LA (left atrium)   . GERD (gastroesophageal reflux disease)   . Hard of hearing    Wears hearing aids.  . Headache   . Heavy cigarette smoker    Quit 2015  . Hyperlipidemia   . Hypertension   . Morbid obesity ([place])   . Numbness and tingling   . Obesity   . Thyroid goiter   . Vertigo    on occasion   Family History  Problem Relation Age of Onset  . Hypertension Mother   . Heart disease Mother     Pacemaker  . Kidney disease Mother   . Cataracts Mother   . Hearing loss Mother   . Diabetes Mother   . Liver disease Mother   . Diabetes Sister   . Cataracts Brother   . Hearing loss Brother   . Colon cancer Neg Hx   . Thyroid disease Neg Hx    Past Surgical History:  Procedure Laterality Date  . BREAST LUMPECTOMY WITH RADIOACTIVE SEED AND SENTINEL LYMPH NODE BIOPSY Left 04/11/2016   Procedure: BREAST LUMPECTOMY WITH RADIOACTIVE SEED AND SENTINEL LYMPH  NODE BIOPSY;  Surgeon: Excell Seltzer, MD;  Location: [facility];  Service: General;  Laterality: Left;  . COLONOSCOPY WITH PROPOFOL N/A 10/20/2015   Procedure: COLONOSCOPY WITH PROPOFOL;  Surgeon: Mauri Pole, MD;  Location: [facility];  Service: Endoscopy;  Laterality: N/A;  . ESOPHAGOGASTRODUODENOSCOPY (EGD) WITH PROPOFOL N/A 10/20/2015   Procedure: ESOPHAGOGASTRODUODENOSCOPY (EGD) WITH PROPOFOL;  Surgeon: Mauri Pole, MD;  Location: [facility];  Service: Endoscopy;  Laterality: N/A;  . LEFT HEART CATHETERIZATION WITH CORONARY ANGIOGRAM N/A 10/21/2014   Procedure: LEFT HEART CATHETERIZATION WITH CORONARY ANGIOGRAM;  Surgeon: Burnell Blanks, MD;  Location: [facility] CATH LAB;  Service: Cardiovascular;  Laterality:  N/A;  . TUBAL LIGATION     Social History   Social History  . Marital status: Single    Spouse name: N/A  . Number of children: 1  . Years of education: 64   Occupational History  . Retired    Social History Main Topics  . Smoking status: Former Smoker    Packs/day: 1.00    Years: 45.00    Types: Cigarettes    Quit date: 07/11/2014  . Smokeless tobacco: Never Used  . Alcohol use No  . Drug use: No  . Sexual activity: Not on file   Other Topics Concern  . Not on file   Social History Narrative   Lives with daughter.    Right-hand.   Occasional use of caffeine.      Review of Systems: General: negative for chills, fever, night sweats or weight changes.  Cardiovascular: negative for chest pain, dyspnea on exertion, edema, orthopnea, palpitations, paroxysmal nocturnal dyspnea or shortness of breath Dermatological: negative for rash Respiratory: negative for cough or wheezing Urologic: negative for hematuria Abdominal: negative for nausea, vomiting, diarrhea, bright red blood per rectum, melena, or hematemesis Neurologic: negative for visual changes, syncope, or dizziness All other systems reviewed and are otherwise negative except as noted above.   Physical Exam:  Blood pressure (!) 100/58, height 5\' 4"  (1.626 m), weight 296 lb (134.3 kg).  General appearance: alert, cooperative, no distress and morbidly obese Neck: no carotid bruit and no JVD Lungs: clear to auscultation bilaterally Heart: regular rate and rhythm, S1, S2 normal, no murmur, click, rub or gallop Extremities: mild LEE Pulses: 2+ and symmetric Skin: Skin color, texture, turgor normal. No rashes or lesions Neurologic: Grossly normal  EKG NSR with PACs   LHC 10/22/15 Angiographic Findings:  Left main: This vessel has an unusual proximal segment but appears to be widely patent. There is no dampening of the catheter on engagement. The cranial shots demonstrate good reflux around the catheter with  streaming of contrast due to the shape of the vessel. The ostium of this vessel does not appear to have atherosclerotic plaque.   Left Anterior Descending Artery: Large caliber vessel that courses to the apex. There is a small caliber diagonal branch with no obstructive disease.   Circumflex Artery: Large caliber vessel with two moderate caliber obtuse marginal branches. No obstructive disease.   Right Coronary Artery: Small non-dominant vessel with no obstructive disease.   Left Ventricular Angiogram: LVEF=65%.   Impression: 1. No angiographic evidence of CAD 2. Normal LV systolic function 3. Non-cardiac chest pain  ASSESSMENT AND PLAN:   1. Atypical Chest Pain: patient had a normal LHC last year. Recent symptoms are atypical and not c/w cardiac etiology. ? GERD. Change from Prilosec to Protonix. F/u with PCP.   2. PACs: PACs noted on EKG. Patient notes a "hard  thump" in her chest waking her from her sleep the other night. Lasted only a split second. She denies any consecutive palpitations. Pt reassured that PACs are benign. BP and HR will not allow addition of low dose BB. Pt can f/u PRN if development of prolonged episodes of palpations.   3. LEE: patient with swelling around ankles. However pt also is morbidly obese. Doubt swelling is cardiac related, however patient is seeking assurance that it is not from a cardiac problem. Will check BNP today.    PLAN  If BNP is abnormal will contact patient with medication advise. Otherwise f/u with PCP. F/u in our clinic PRN.   Lyda Jester PA-C 07/31/2016 11:19 AM

## 2016-08-01 ENCOUNTER — Telehealth: Payer: Self-pay | Admitting: Cardiology

## 2016-08-01 NOTE — Telephone Encounter (Signed)
New message     Patient calling back for lab results.

## 2016-08-01 NOTE — Telephone Encounter (Signed)
I spoke with the pt and made her aware of BMP and BNP results.   Notes Recorded by Consuelo Pandy, PA-C on 08/01/2016 at 7:57 AM EST Labs are normal. BNP normal. No med changes recommended. F/u with PCP.

## 2016-08-03 ENCOUNTER — Ambulatory Visit: Admission: RE | Admit: 2016-08-03 | Payer: Medicare Other | Source: Ambulatory Visit

## 2016-08-09 ENCOUNTER — Encounter: Payer: Self-pay | Admitting: Family Medicine

## 2016-08-09 ENCOUNTER — Ambulatory Visit (INDEPENDENT_AMBULATORY_CARE_PROVIDER_SITE_OTHER): Payer: Medicare Other | Admitting: Family Medicine

## 2016-08-09 VITALS — BP 114/70 | HR 84 | Resp 14 | Ht 64.0 in | Wt 292.8 lb

## 2016-08-09 DIAGNOSIS — Z79899 Other long term (current) drug therapy: Secondary | ICD-10-CM | POA: Diagnosis not present

## 2016-08-09 DIAGNOSIS — I10 Essential (primary) hypertension: Secondary | ICD-10-CM

## 2016-08-09 DIAGNOSIS — K219 Gastro-esophageal reflux disease without esophagitis: Secondary | ICD-10-CM

## 2016-08-09 DIAGNOSIS — F411 Generalized anxiety disorder: Secondary | ICD-10-CM | POA: Insufficient documentation

## 2016-08-09 DIAGNOSIS — Z8639 Personal history of other endocrine, nutritional and metabolic disease: Secondary | ICD-10-CM | POA: Diagnosis not present

## 2016-08-09 DIAGNOSIS — Z23 Encounter for immunization: Secondary | ICD-10-CM

## 2016-08-09 DIAGNOSIS — R0789 Other chest pain: Secondary | ICD-10-CM | POA: Diagnosis not present

## 2016-08-09 LAB — LIPID PANEL
CHOL/HDL RATIO: 2.6 ratio (ref <5.0)
Cholesterol: 168 mg/dL (ref <200)
HDL: 65 mg/dL (ref >50)
LDL Cholesterol: 95 mg/dL (ref <100)
Triglycerides: 39 mg/dL (ref <150)
VLDL: 8 mg/dL (ref <30)

## 2016-08-09 MED ORDER — ESCITALOPRAM OXALATE 5 MG PO TABS: 5.0000 mg | ORAL_TABLET | Freq: Every day | ORAL | 0 refills | Status: DC

## 2016-08-09 NOTE — Progress Notes (Signed)
Subjective:    Patient ID: Sheila Jacobs, female    DOB: 1951/08/17, 65 y.o.   MRN: [id number]  HPI Chief Complaint  Patient presents with  . Follow-up    Lipids, B/P and chest pain   She is here for follow-up on hypertension, hyperlipidemia and atypical chest pain.  States she received her final treatment for breast cancer on 07/30/2016. No concerns or complaints today regarding this.   States she feels like she has some bad "nerves" in her chest. Does not have chest pain today but states pain occurs several times throughout the day. Pain is not brought on by anything specific that she knows.  States the cardiologist told her her heart was fine. She is adamant that she would like to start on "nerve medication". States when she takes Xanax, her chest pain improves.  Denies palpitations, shortness of breath, orthopnea, abdominal pain, N/V/D.  Has seen a counselor in the past but did not like going and talking. Does not want to return.  Denies fever, chills, dizziness, abdominal pain, N/V/D.  She had a normal BMP and BNP done at her cardiologist last week. She was told that there was no cardiac explanation for her chest pain.  States she does not check her blood pressure at home. Reports good compliance of medication.  Is taking daily protonix and denies having reflux. Has not tried to stop the medication and see if her symptoms return. She has seen GI for GERD.   Past Medical History:  Diagnosis Date  . Anxiety   . Arthritis   . Breast cancer of upper-outer quadrant of left female breast ([place]) 01/31/2016  . Colon polyps   . Enlarged LA (left atrium)   . GERD (gastroesophageal reflux disease)   . Hard of hearing    Wears hearing aids.  . Headache   . Heavy cigarette smoker    Quit 2015  . Hyperlipidemia   . Hypertension   . Morbid obesity ([place])   . Numbness and tingling   . Obesity   . Thyroid goiter   . Vertigo    on occasion   Reviewed allergies, medications, past  medical,and social history.   Review of Systems Pertinent positives and negatives in the history of present illness.     Objective:   Physical Exam  Constitutional: She is oriented to person, place, and time. She appears well-developed and well-nourished. No distress.  Cardiovascular: Normal rate, regular rhythm and normal heart sounds.  Exam reveals no gallop and no friction rub.   No murmur heard. Pulmonary/Chest: Effort normal and breath sounds normal.  Neurological: She is alert and oriented to person, place, and time.  Skin: Skin is warm and dry. No pallor.   BP 114/70   Pulse 84   Resp 14   Ht 5\' 4"  (1.626 m)   Wt 292 lb 12.8 oz (132.8 kg)   SpO2 97%   BMI 50.26 kg/m       Assessment & Plan:  Essential hypertension  Gastroesophageal reflux disease, esophagitis presence not specified  Need for prophylactic vaccination and inoculation against influenza - Plan: Flu vaccine HIGH DOSE PF (Fluzone High dose)  History of hyperlipidemia - Plan: Lipid panel  Atypical chest pain  Generalized anxiety disorder  Discussed that she has been evaluated for chest pain by the cardiologist and GI. The pain is unchanged which speaks to this being nothing serious or life threatening. She does not appear to be reassured.  Reviewed with the patient  that she had a normal chest x-ray on 07/10/2016 and a normal EKG and labs at her cardiologist appointment. She does admit that she has significant nervousness but does not want to call this anxiety.  She is requesting to start on medication for anxiety. Plan to start her on low dose SSRI. Discussed possible side effects.  She has been to a counselor in the past and refuses to go back.   Her blood pressure is within goal range. No changes to medication.  She continues taking daily protonix and denies having any reflux symptoms. She can stop this medication and see if her symptoms return.  Plan to check fasting lipids today.  Follow up in 2  weeks.

## 2016-08-22 ENCOUNTER — Other Ambulatory Visit: Payer: Self-pay | Admitting: Emergency Medicine

## 2016-08-22 ENCOUNTER — Ambulatory Visit: Payer: Medicare Other | Admitting: Family Medicine

## 2016-08-22 DIAGNOSIS — C50412 Malignant neoplasm of upper-outer quadrant of left female breast: Secondary | ICD-10-CM

## 2016-08-22 DIAGNOSIS — Z170 Estrogen receptor positive status [ER+]: Principal | ICD-10-CM

## 2016-08-23 ENCOUNTER — Other Ambulatory Visit: Payer: Self-pay

## 2016-08-23 ENCOUNTER — Telehealth: Payer: Self-pay

## 2016-08-23 ENCOUNTER — Encounter: Payer: Self-pay | Admitting: *Deleted

## 2016-08-23 ENCOUNTER — Ambulatory Visit: Payer: Self-pay | Admitting: Hematology and Oncology

## 2016-08-23 NOTE — Telephone Encounter (Signed)
Pt is asking if [facility] will pay for rent in December. Only if while in radiation or while he is under care at cancer center? He does not know completely how the program works. Please call cell phone.

## 2016-08-23 NOTE — Progress Notes (Signed)
[facility]  Clinical Social Work was referred by patient and RN for assessment of psychosocial needs.  Clinical Social Worker contacted patient at home to offer support and assess for needs.  CSW reviewed guidelines for [employer] and pt is out of treatment currently. CSW sent referral to [employer], Development worker, community. CSW left several messages to return CSW call.     Loren Racer, [place] Worker [place]  [place] Phone: [phone] Fax: [phone]

## 2016-08-27 ENCOUNTER — Ambulatory Visit (INDEPENDENT_AMBULATORY_CARE_PROVIDER_SITE_OTHER): Payer: Medicare Other | Admitting: Family Medicine

## 2016-08-27 ENCOUNTER — Telehealth: Payer: Self-pay | Admitting: *Deleted

## 2016-08-27 ENCOUNTER — Encounter: Payer: Self-pay | Admitting: Family Medicine

## 2016-08-27 VITALS — BP 130/80 | HR 77 | Wt 300.6 lb

## 2016-08-27 DIAGNOSIS — F411 Generalized anxiety disorder: Secondary | ICD-10-CM | POA: Diagnosis not present

## 2016-08-27 DIAGNOSIS — Z91199 Patient's noncompliance with other medical treatment and regimen due to unspecified reason: Secondary | ICD-10-CM | POA: Insufficient documentation

## 2016-08-27 DIAGNOSIS — Z9119 Patient's noncompliance with other medical treatment and regimen: Secondary | ICD-10-CM | POA: Diagnosis not present

## 2016-08-27 DIAGNOSIS — I10 Essential (primary) hypertension: Secondary | ICD-10-CM | POA: Diagnosis not present

## 2016-08-27 HISTORY — DX: Patient's noncompliance with other medical treatment and regimen: Z91.19

## 2016-08-27 HISTORY — DX: Patient's noncompliance with other medical treatment and regimen due to unspecified reason: Z91.199

## 2016-08-27 MED ORDER — VENLAFAXINE HCL ER 37.5 MG PO CP24: 37.5000 mg | ORAL_CAPSULE | Freq: Every day | ORAL | 1 refills | Status: DC

## 2016-08-27 NOTE — Patient Instructions (Addendum)
I am starting you on a new anxiety medication. You have to take this every morning with breakfast. Call me in one week and let me know how you are doing on the medication.  I also recommend calling and scheduling an appointment with a counselor. You can call from the numbers below.   Start walking every day for exercise.  Eat more salads with low fat dressing and avoid sweets and sodas.  See the information below for help with counting calories.    You can call to schedule your appointment with a counselor for anxiety. A few offices are listed below for you to call.   [facility]  [place], [place], [place] [address]  Phone: [phone]  [facility] [address] [place], [place] [address]  Phone: [phone]  [facility]  [address], [place], [place] [address] Phone: [phone]  Calorie Counting for Weight Loss Calories are energy you get from the things you eat and drink. Your body uses this energy to keep you going throughout the day. The number of calories you eat affects your weight. When you eat more calories than your body needs, your body stores the extra calories as fat. When you eat fewer calories than your body needs, your body burns fat to get the energy it needs. Calorie counting means keeping track of how many calories you eat and drink each day. If you make sure to eat fewer calories than your body needs, you should lose weight. In order for calorie counting to work, you will need to eat the number of calories that are right for you in a day to lose a healthy amount of weight per week. A healthy amount of weight to lose per week is usually 1-2 lb (0.5-0.9 kg). A dietitian can determine how many calories you need in a day and give you suggestions on how to reach your calorie goal.  WHAT IS MY MY PLAN? My goal is to have __________ calories per day.  If I have this  many calories per day, I should lose around __________ pounds per week. WHAT DO I NEED TO KNOW ABOUT CALORIE COUNTING? In order to meet your daily calorie goal, you will need to:  Find out how many calories are in each food you would like to eat. Try to do this before you eat.  Decide how much of the food you can eat.  Write down what you ate and how many calories it had. Doing this is called keeping a food log. WHERE DO I FIND CALORIE INFORMATION? The number of calories in a food can be found on a Nutrition Facts label. Note that all the information on a label is based on a specific serving of the food. If a food does not have a Nutrition Facts label, try to look up the calories online or ask your dietitian for help. HOW DO I DECIDE HOW MUCH TO EAT? To decide how much of the food you can eat, you will need to consider both the number of calories in one serving and the size of one serving. This information can be found on the Nutrition Facts label. If a food does not have a Nutrition Facts label, look up the information online or ask your dietitian for help. Remember that calories are listed per serving. If you choose to have more than one serving of a food, you will have to multiply  the calories per serving by the amount of servings you plan to eat. For example, the label on a package of bread might say that a serving size is 1 slice and that there are 90 calories in a serving. If you eat 1 slice, you will have eaten 90 calories. If you eat 2 slices, you will have eaten 180 calories. HOW DO I KEEP A FOOD LOG? After each meal, record the following information in your food log:  What you ate.  How much of it you ate.  How many calories it had.  Then, add up your calories. Keep your food log near you, such as in a small notebook in your pocket. Another option is to use a mobile app or website. Some programs will calculate calories for you and show you how many calories you have left each time  you add an item to the log. WHAT ARE SOME CALORIE COUNTING TIPS?  Use your calories on foods and drinks that will fill you up and not leave you hungry. Some examples of this include foods like nuts and nut butters, vegetables, lean proteins, and high-fiber foods (more than 5 g fiber per serving).  Eat nutritious foods and avoid empty calories. Empty calories are calories you get from foods or beverages that do not have many nutrients, such as candy and soda. It is better to have a nutritious high-calorie food (such as an avocado) than a food with few nutrients (such as a bag of chips).  Know how many calories are in the foods you eat most often. This way, you do not have to look up how many calories they have each time you eat them.  Look out for foods that may seem like low-calorie foods but are really high-calorie foods, such as baked goods, soda, and fat-free candy.  Pay attention to calories in drinks. Drinks such as sodas, specialty coffee drinks, alcohol, and juices have a lot of calories yet do not fill you up. Choose low-calorie drinks like water and diet drinks.  Focus your calorie counting efforts on higher calorie items. Logging the calories in a garden salad that contains only vegetables is less important than calculating the calories in a milk shake.  Find a way of tracking calories that works for you. Get creative. Most people who are successful find ways to keep track of how much they eat in a day, even if they do not count every calorie. WHAT ARE SOME PORTION CONTROL TIPS?  Know how many calories are in a serving. This will help you know how many servings of a certain food you can have.  Use a measuring cup to measure serving sizes. This is helpful when you start out. With time, you will be able to estimate serving sizes for some foods.  Take some time to put servings of different foods on your favorite plates, bowls, and cups so you know what a serving looks like.  Try not to  eat straight from a bag or box. Doing this can lead to overeating. Put the amount you would like to eat in a cup or on a plate to make sure you are eating the right portion.  Use smaller plates, glasses, and bowls to prevent overeating. This is a quick and easy way to practice portion control. If your plate is smaller, less food can fit on it.  Try not to multitask while eating, such as watching TV or using your computer. If it is time to eat, sit down  at a table and enjoy your food. Doing this will help you to start recognizing when you are full. It will also make you more aware of what and how much you are eating. HOW CAN I CALORIE COUNT WHEN EATING OUT?  Ask for smaller portion sizes or child-sized portions.  Consider sharing an entree and sides instead of getting your own entree.  If you get your own entree, eat only half. Ask for a box at the beginning of your meal and put the rest of your entree in it so you are not tempted to eat it.  Look for the calories on the menu. If calories are listed, choose the lower calorie options.  Choose dishes that include vegetables, fruits, whole grains, low-fat dairy products, and lean protein. Focusing on smart food choices from each of the 5 food groups can help you stay on track at restaurants.  Choose items that are boiled, broiled, grilled, or steamed.  Choose water, milk, unsweetened iced tea, or other drinks without added sugars. If you want an alcoholic beverage, choose a lower calorie option. For example, a regular margarita can have up to 700 calories and a glass of wine has around 150.  Stay away from items that are buttered, battered, fried, or served with cream sauce. Items labeled "crispy" are usually fried, unless stated otherwise.  Ask for dressings, sauces, and syrups on the side. These are usually very high in calories, so do not eat much of them.  Watch out for salads. Many people think salads are a healthy option, but this is often  not the case. Many salads come with bacon, fried chicken, lots of cheese, fried chips, and dressing. All of these items have a lot of calories. If you want a salad, choose a garden salad and ask for grilled meats or steak. Ask for the dressing on the side, or ask for olive oil and vinegar or lemon to use as dressing.  Estimate how many servings of a food you are given. For example, a serving of cooked rice is  cup or about the size of half a tennis ball or one cupcake wrapper. Knowing serving sizes will help you be aware of how much food you are eating at restaurants. The list below tells you how big or small some common portion sizes are based on everyday objects.  1 oz-4 stacked dice.  3 oz-1 deck of cards.  1 tsp-1 dice.  1 Tbsp- a Ping-Pong ball.  2 Tbsp-1 Ping-Pong ball.   cup-1 tennis ball or 1 cupcake wrapper.  1 cup-1 baseball. This information is not intended to replace advice given to you by your health care provider. Make sure you discuss any questions you have with your health care provider. Document Released: 08/27/2005 Document Revised: 09/17/2014 Document Reviewed: 07/02/2013 Elsevier Interactive Patient Education  2017 [place] DASH stands for "Dietary Approaches to Stop Hypertension." The DASH eating plan is a healthy eating plan that has been shown to reduce high blood pressure (hypertension). Additional health benefits may include reducing the risk of type 2 diabetes mellitus, heart disease, and stroke. The DASH eating plan may also help with weight loss. What do I need to know about the DASH eating plan? For the DASH eating plan, you will follow these general guidelines:  Choose foods with less than 150 milligrams of sodium per serving (as listed on the food label).  Use salt-free seasonings or herbs instead of table salt or sea salt.  Check with your health care provider or pharmacist before using salt substitutes.  Eat lower-sodium  products. These are often labeled as "low-sodium" or "no salt added."  Eat fresh foods. Avoid eating a lot of canned foods.  Eat more vegetables, fruits, and low-fat dairy products.  Choose whole grains. Look for the word "whole" as the first word in the ingredient list.  Choose fish and skinless chicken or [place] more often than red meat. Limit fish, poultry, and meat to 6 oz (170 g) each day.  Limit sweets, desserts, sugars, and sugary drinks.  Choose heart-healthy fats.  Eat more home-cooked food and less restaurant, buffet, and fast food.  Limit fried foods.  Do not fry foods. Cook foods using methods such as baking, boiling, grilling, and broiling instead.  When eating at a restaurant, ask that your food be prepared with less salt, or no salt if possible. What foods can I eat? Seek help from a dietitian for individual calorie needs. Grains  Whole grain or whole wheat bread. Brown rice. Whole grain or whole wheat pasta. Quinoa, bulgur, and whole grain cereals. Low-sodium cereals. Corn or whole wheat flour tortillas. Whole grain cornbread. Whole grain crackers. Low-sodium crackers. Vegetables  Fresh or frozen vegetables (raw, steamed, roasted, or grilled). Low-sodium or reduced-sodium tomato and vegetable juices. Low-sodium or reduced-sodium tomato sauce and paste. Low-sodium or reduced-sodium canned vegetables. Fruits  All fresh, canned (in natural juice), or frozen fruits. Meat and Other Protein Products  Ground beef (85% or leaner), grass-fed beef, or beef trimmed of fat. Skinless chicken or [place]. Ground chicken or [place]. Pork trimmed of fat. All fish and seafood. Eggs. Dried beans, peas, or lentils. Unsalted nuts and seeds. Unsalted canned beans. Dairy  Low-fat dairy products, such as skim or 1% milk, 2% or reduced-fat cheeses, low-fat ricotta or cottage cheese, or plain low-fat yogurt. Low-sodium or reduced-sodium cheeses. Fats and Oils  Tub margarines without trans fats.  Light or reduced-fat mayonnaise and salad dressings (reduced sodium). Avocado. Safflower, olive, or canola oils. Natural peanut or almond butter. Other  Unsalted popcorn and pretzels. The items listed above may not be a complete list of recommended foods or beverages. Contact your dietitian for more options.  What foods are not recommended? Grains  White bread. White pasta. White rice. Refined cornbread. Bagels and croissants. Crackers that contain trans fat. Vegetables  Creamed or fried vegetables. Vegetables in a cheese sauce. Regular canned vegetables. Regular canned tomato sauce and paste. Regular tomato and vegetable juices. Fruits  Canned fruit in light or heavy syrup. Fruit juice. Meat and Other Protein Products  Fatty cuts of meat. Ribs, chicken wings, bacon, sausage, bologna, salami, chitterlings, fatback, hot dogs, bratwurst, and packaged luncheon meats. Salted nuts and seeds. Canned beans with salt. Dairy  Whole or 2% milk, cream, half-and-half, and cream cheese. Whole-fat or sweetened yogurt. Full-fat cheeses or blue cheese. Nondairy creamers and whipped toppings. Processed cheese, cheese spreads, or cheese curds. Condiments  Onion and garlic salt, seasoned salt, table salt, and sea salt. Canned and packaged gravies. Worcestershire sauce. Tartar sauce. Barbecue sauce. Teriyaki sauce. Soy sauce, including reduced sodium. Steak sauce. Fish sauce. Oyster sauce. Cocktail sauce. Horseradish. Ketchup and mustard. Meat flavorings and tenderizers. Bouillon cubes. Hot sauce. Tabasco sauce. Marinades. Taco seasonings. Relishes. Fats and Oils  Butter, stick margarine, lard, shortening, ghee, and bacon fat. Coconut, palm kernel, or palm oils. Regular salad dressings. Other  Pickles and olives. Salted popcorn and pretzels. The items listed above may not be a  complete list of foods and beverages to avoid. Contact your dietitian for more information.  Where can I find more information? National  Heart, Lung, and Blood Institute: travelstabloid.com This information is not intended to replace advice given to you by your health care provider. Make sure you discuss any questions you have with your health care provider. Document Released: 08/16/2011 Document Revised: 02/02/2016 Document Reviewed: 07/01/2013 Elsevier Interactive Patient Education  2017 [employer].

## 2016-08-27 NOTE — Progress Notes (Signed)
Subjective:    Patient ID: Sheila Jacobs, female    DOB: May 08, 1951, 65 y.o.   MRN: [id number]  HPI Chief Complaint  Patient presents with  . 2 week follow-up    2 week follow-up   She is here for a follow up on anxiety and new medication. She requested medication for anxiety at our last visit in November and started taking Lexapro. States she stopped it after 3 days because it made her "heart race".  She has been to counseling in the past but only went to one appointment. States it did not help. States she is not interested in counseling. Would like to try a different medication. Denies SI or HI.   She has gained weight since our last visit. States she has been skipping some meals. She does not eat breakfast usually. Reports having a diet of unhealthy food when she does eat such as potato chips and soda -Coke, cake, salads occasionally but uses "alot" of ranch dressing. Stats she has eaten a whole cake over the past week.  She does her own grocery shopping. Shops at [employer]. Goes once monthly.  Has not been walking or doing any type of exercise. Referred her in December 2016 for nutrition counseling. She did go to one visit but states she did not take their advice and does not want to go back.  States she is aware that she is gaining weight and knows she needs to eat healthier.   States she says in [place]. She has a daughter near by. Her daughter has a car but she states she usually has to take the bus places.  There is a note for a Education officer, museum consult and they have tried to contact her but she did not answer. She is aware that she has missed several calls from them and plans to call them back.   Denies fever, chills, headache, dizziness, chest pain, palpitations, cough, DOE, abdominal pain, N/V/D.     Review of Systems Pertinent positives and negatives in the history of present illness.     Objective:   Physical Exam BP 130/80   Pulse 77   Wt (!) 300 lb 9.6 oz (136.4  kg)   BMI 51.60 kg/m   Alert and in no distress.Pharyngeal area is normal. Neck is supple without adenopathy or thyromegaly. Cardiac exam shows a regular sinus rhythm without murmurs or gallops. Lungs are clear to auscultation.      Assessment & Plan:  Generalized anxiety disorder  Essential hypertension  Morbid obesity ([place])  Personal history of noncompliance with medical treatment, presenting hazards to health  She has not taken her BP medication today. Counseled patient on medication compliance and lifestyle modifications. Discussed that she has gained weight since her last visit and she admits to eating a poor and unhealthy diet. She also admits to not being physically active. Spent several minutes discussing how she can be more active including walking around her apartment or at the grocery store or at a department store. Recommend she avoid eating cakes and sweets and drinking sodas and start eating more salads. Discussed that she is placing herself at an increased risk of developing heart disease and other chronic health conditions. She verbalized understanding. She has seen the nutritionist in the past year and states she does not want to go back. States this did not help her. Reports lack of motivation as the reason she is not eating healthy.  She is adamant that she would like  to try a different medication for anxiety. Plan to start her on venlafaxine and she will call in one week to let me know how she is doing on the medication. I'm also recommending counseling. She went to one visit in the past year and did not return. She states she is willing to go. Plan to follow-up in one month.

## 2016-08-27 NOTE — Telephone Encounter (Signed)
"  I missed a call.  When's my appointment with Dr. Lindi Adie?"  Advised lab at 9:15 am followed by appointment with Dr. Lindi Adie.  Advised arrive at 0900 a the latest to register.

## 2016-08-28 NOTE — Assessment & Plan Note (Signed)
Left lumpectomy 04/11/2016: IDC grade 3, 1.2 cm, IG DCIS, LVI present, 0/8 lymph nodes negative, ER 100%, PR 90%, HER-2 negative, Ki-67 10%, T1 cN0 stage I a pathologic stage  Adj XRT 06/18/16 to 07/30/16  Treatment Plan: Adj Anti-estrogen therapy with Letrozole 2.5 mg daily  Letrozole Counselling: We discussed the risks and benefits of anti-estrogen therapy with aromatase inhibitors. These include but not limited to insomnia, hot flashes, mood changes, vaginal dryness, bone density loss, and weight gain. We strongly believe that the benefits far outweigh the risks. Patient understands these risks and consented to starting treatment. Planned treatment duration is 5 yrs

## 2016-08-29 ENCOUNTER — Encounter: Payer: Self-pay | Admitting: Radiation Oncology

## 2016-08-29 ENCOUNTER — Encounter: Payer: Self-pay | Admitting: Hematology and Oncology

## 2016-08-29 ENCOUNTER — Ambulatory Visit (HOSPITAL_BASED_OUTPATIENT_CLINIC_OR_DEPARTMENT_OTHER): Payer: Medicare Other | Admitting: Hematology and Oncology

## 2016-08-29 ENCOUNTER — Other Ambulatory Visit (HOSPITAL_BASED_OUTPATIENT_CLINIC_OR_DEPARTMENT_OTHER): Payer: Medicare Other

## 2016-08-29 DIAGNOSIS — Z170 Estrogen receptor positive status [ER+]: Principal | ICD-10-CM

## 2016-08-29 DIAGNOSIS — C50412 Malignant neoplasm of upper-outer quadrant of left female breast: Secondary | ICD-10-CM | POA: Diagnosis not present

## 2016-08-29 LAB — CBC WITH DIFFERENTIAL/PLATELET
BASO%: 0.3 % (ref 0.0–2.0)
BASOS ABS: 0 10*3/uL (ref 0.0–0.1)
EOS ABS: 0.1 10*3/uL (ref 0.0–0.5)
EOS%: 4.8 % (ref 0.0–7.0)
HEMATOCRIT: 33.9 % — AB (ref 34.8–46.6)
HEMOGLOBIN: 11.2 g/dL — AB (ref 11.6–15.9)
LYMPH#: 1.3 10*3/uL (ref 0.9–3.3)
LYMPH%: 42.5 % (ref 14.0–49.7)
MCH: 31.4 pg (ref 25.1–34.0)
MCHC: 33 g/dL (ref 31.5–36.0)
MCV: 95 fL (ref 79.5–101.0)
MONO#: 0.3 10*3/uL (ref 0.1–0.9)
MONO%: 11.6 % (ref 0.0–14.0)
NEUT%: 40.8 % (ref 38.4–76.8)
NEUTROS ABS: 1.2 10*3/uL — AB (ref 1.5–6.5)
Platelets: 153 10*3/uL (ref 145–400)
RBC: 3.57 10*6/uL — ABNORMAL LOW (ref 3.70–5.45)
RDW: 12.6 % (ref 11.2–14.5)
WBC: 2.9 10*3/uL — AB (ref 3.9–10.3)

## 2016-08-29 LAB — COMPREHENSIVE METABOLIC PANEL
ALBUMIN: 3.3 g/dL — AB (ref 3.5–5.0)
ALK PHOS: 80 U/L (ref 40–150)
ALT: 11 U/L (ref 0–55)
AST: 16 U/L (ref 5–34)
Anion Gap: 7 mEq/L (ref 3–11)
BILIRUBIN TOTAL: 0.49 mg/dL (ref 0.20–1.20)
BUN: 9.4 mg/dL (ref 7.0–26.0)
CALCIUM: 9.3 mg/dL (ref 8.4–10.4)
CO2: 26 mEq/L (ref 22–29)
Chloride: 106 mEq/L (ref 98–109)
Creatinine: 0.8 mg/dL (ref 0.6–1.1)
GLUCOSE: 108 mg/dL (ref 70–140)
POTASSIUM: 4 meq/L (ref 3.5–5.1)
Sodium: 139 mEq/L (ref 136–145)
TOTAL PROTEIN: 7.3 g/dL (ref 6.4–8.3)

## 2016-08-29 MED ORDER — LETROZOLE 2.5 MG PO TABS: 2.5000 mg | ORAL_TABLET | Freq: Every day | ORAL | 3 refills | Status: DC

## 2016-08-29 NOTE — Progress Notes (Signed)
Patient Care Team: Girtha Rm, NP as PCP - General Sylvan Cheese, NP as Nurse Practitioner (Hematology and Oncology)  DIAGNOSIS:  Encounter Diagnosis  Name Primary?  . Malignant neoplasm of upper-outer quadrant of left breast in female, estrogen receptor positive ([place])     SUMMARY OF ONCOLOGIC HISTORY:   Breast cancer of upper-outer quadrant of left female breast ([place])   01/27/2016 Initial Diagnosis    Screening mammogram: Left breast mass 9 mm, grade 1-2 IDC with DCIS, ER 100%, PR 90%, Ki-67 10%, HER-2 negative ratio 1.5, T1 BN 0 stage IA clinical stage      04/11/2016 Surgery    Left lumpectomy: IDC grade 3, 1.2 cm, IG DCIS, LVI present, 0/8 lymph nodes negative, ER 100%, PR 90%, HER-2 negative, Ki-67 10%, T1 cN0 stage I a pathologic stage      06/18/2016 - 07/30/2016 Radiation Therapy    Adj XRT      08/29/2016 -  Anti-estrogen oral therapy    Letrozole 2.5 mg daily       CHIEF COMPLIANT: Follow-up after radiation therapy to discuss starting letrozole  INTERVAL HISTORY: Sheila Jacobs is a 81 year with above-mentioned history left breast cancer underwent lumpectomy and adjuvant radiation. She is here today to discuss starting letrozole therapy. She had done extremely well from radiation standpoint.  REVIEW OF SYSTEMS:   Constitutional: Denies fevers, chills or abnormal weight loss Eyes: Denies blurriness of vision Ears, nose, mouth, throat, and face: Denies mucositis or sore throat Respiratory: Denies cough, dyspnea or wheezes Cardiovascular: Denies palpitation, chest discomfort Gastrointestinal:  Denies nausea, heartburn or change in bowel habits Skin: Denies abnormal skin rashes Lymphatics: Denies new lymphadenopathy or easy bruising Neurological:Denies numbness, tingling or new weaknesses Behavioral/Psych: Mood is stable, no new changes  Extremities: No lower extremity edema Breast:  denies any pain or lumps or nodules in either breasts All  other systems were reviewed with the patient and are negative.  I have reviewed the past medical history, past surgical history, social history and family history with the patient and they are unchanged from previous note.  ALLERGIES:  is allergic to cortisone and lexapro [escitalopram].  MEDICATIONS:  Current Outpatient Prescriptions  Medication Sig Dispense Refill  . ALPRAZolam (XANAX) 0.25 MG tablet TAKE ONE TABLET BY MOUTH TWICE DAILY AS NEEDED FOR ANXIETY 30 tablet 0  . aspirin EC 81 MG tablet Take 81 mg by mouth daily.    . calcium citrate-vitamin D (CITRACAL+D) 315-200 MG-UNIT per tablet Take 1 tablet by mouth daily.    Marland Kitchen emollient (RADIAGEL) gel Apply topically as needed for wound care.    . hydrochlorothiazide (HYDRODIURIL) 25 MG tablet Take 0.5 tablets (12.5 mg total) by mouth daily. 90 tablet 1  . ketoconazole (NIZORAL) 2 % cream     . letrozole (FEMARA) 2.5 MG tablet Take 1 tablet (2.5 mg total) by mouth daily. 90 tablet 3  . pantoprazole (PROTONIX) 40 MG tablet Take 1 tablet (40 mg total) by mouth daily. 30 tablet 11  . potassium chloride SA (K-DUR,KLOR-CON) 20 MEQ tablet Take 1 tablet (20 mEq total) by mouth daily. 90 tablet 1  . venlafaxine XR (EFFEXOR XR) 37.5 MG 24 hr capsule Take 1 capsule (37.5 mg total) by mouth daily with breakfast. 30 capsule 1   No current facility-administered medications for this visit.     PHYSICAL EXAMINATION: ECOG PERFORMANCE STATUS: 1 - Symptomatic but completely ambulatory  Vitals:   08/29/16 0927  BP: (!) 134/111  Pulse:  70  Resp: 18  Temp: 97.6 F (36.4 C)   Filed Weights   08/29/16 0927  Weight: (!) 304 lb 4.8 oz (138 kg)    GENERAL:alert, no distress and comfortable SKIN: skin color, texture, turgor are normal, no rashes or significant lesions EYES: normal, Conjunctiva are pink and non-injected, sclera clear OROPHARYNX:no exudate, no erythema and lips, buccal mucosa, and tongue normal  NECK: supple, thyroid normal size,  non-tender, without nodularity LYMPH:  no palpable lymphadenopathy in the cervical, axillary or inguinal LUNGS: clear to auscultation and percussion with normal breathing effort HEART: regular rate & rhythm and no murmurs and no lower extremity edema ABDOMEN:abdomen soft, non-tender and normal bowel sounds MUSCULOSKELETAL:no cyanosis of digits and no clubbing  NEURO: alert & oriented x 3 with fluent speech, no focal motor/sensory deficits EXTREMITIES: No lower extremity edema  LABORATORY DATA:  I have reviewed the data as listed   Chemistry      Component Value Date/Time   NA 139 08/29/2016 0856   K 4.0 08/29/2016 0856   CL 107 07/31/2016 1200   CO2 26 08/29/2016 0856   BUN 9.4 08/29/2016 0856   CREATININE 0.8 08/29/2016 0856      Component Value Date/Time   CALCIUM 9.3 08/29/2016 0856   ALKPHOS 80 08/29/2016 0856   AST 16 08/29/2016 0856   ALT 11 08/29/2016 0856   BILITOT 0.49 08/29/2016 0856       Lab Results  Component Value Date   WBC 2.9 (L) 08/29/2016   HGB 11.2 (L) 08/29/2016   HCT 33.9 (L) 08/29/2016   MCV 95.0 08/29/2016   PLT 153 08/29/2016   NEUTROABS 1.2 (L) 08/29/2016    ASSESSMENT & PLAN:  Breast cancer of upper-outer quadrant of left female breast ([place]) Left lumpectomy 04/11/2016: IDC grade 3, 1.2 cm, IG DCIS, LVI present, 0/8 lymph nodes negative, ER 100%, PR 90%, HER-2 negative, Ki-67 10%, T1 cN0 stage I a pathologic stage  Adj XRT 06/18/16 to 07/30/16  Treatment Plan: Adj Anti-estrogen therapy with Letrozole 2.5 mg daily  Letrozole Counselling: We discussed the risks and benefits of anti-estrogen therapy with aromatase inhibitors. These include but not limited to insomnia, hot flashes, mood changes, vaginal dryness, bone density loss, and weight gain. We strongly believe that the benefits far outweigh the risks. Patient understands these risks and consented to starting treatment. Planned treatment duration is 5 yrs  Return to clinic in 3 months for  toxicity check and follow-up No orders of the defined types were placed in this encounter.  The patient has a good understanding of the overall plan. she agrees with it. she will call with any problems that may develop before the next visit here.   Rulon Eisenmenger, MD 08/29/16

## 2016-08-31 ENCOUNTER — Ambulatory Visit
Admission: RE | Admit: 2016-08-31 | Discharge: 2016-08-31 | Disposition: A | Discharge status: Home / Self Care | Payer: Medicare Other | Source: Ambulatory Visit | Attending: Radiation Oncology | Admitting: Radiation Oncology

## 2016-08-31 ENCOUNTER — Encounter: Payer: Self-pay | Admitting: Radiation Oncology

## 2016-08-31 VITALS — BP 129/61 | HR 80 | Temp 97.9°F | Ht 64.0 in | Wt 307.4 lb

## 2016-08-31 DIAGNOSIS — C50412 Malignant neoplasm of upper-outer quadrant of left female breast: Secondary | ICD-10-CM | POA: Diagnosis not present

## 2016-08-31 DIAGNOSIS — Z170 Estrogen receptor positive status [ER+]: Secondary | ICD-10-CM | POA: Diagnosis not present

## 2016-08-31 HISTORY — DX: Personal history of irradiation: Z92.3

## 2016-08-31 NOTE — Progress Notes (Signed)
Sheila Jacobs presents for follow of radiation completed 07/30/16 to her Left Breast. She denies pain currently, but does report occasional "sharp" pain over her Left Breast. Her Left Breast is hyperpigmented. She does have dry skin present with no drainage present. She continues to use Radiaplex to her Left Breast and knows to change to a Vitamin E lotion when the radiaplex is finished. She has not started her Letrozole prescribed by Dr. Lindi Adie yet, but plans to pick it up today. She has no other concerns at this time. She has recently seen her heart doctor for heart palpitations, and voices concerns about this. She knows she can call them back if she has further symptoms.   BP 129/61   Pulse 80   Temp 97.9 F (36.6 C)   Ht 5\' 4"  (1.626 m)   Wt (!) 307 lb 6.4 oz (139.4 kg)   SpO2 100% Comment: room air  BMI 52.77 kg/m    Wt Readings from Last 3 Encounters:  08/31/16 (!) 307 lb 6.4 oz (139.4 kg)  08/29/16 (!) 304 lb 4.8 oz (138 kg)  08/27/16 (!) 300 lb 9.6 oz (136.4 kg)

## 2016-08-31 NOTE — Addendum Note (Signed)
Encounter addended by: Avianna Moynahan L Tinea Nobile, RN on: 08/31/2016  2:53 PM<BR>    Actions taken: Charge Capture section accepted

## 2016-08-31 NOTE — Progress Notes (Signed)
Radiation Oncology         [phone] ________________________________  Name: Sheila Jacobs MRN: [id number]  Date: 08/31/2016  DOB: 10/15/1950  Follow-Up Visit Note  Outpatient  CC: Harland Dingwall, NP  Nicholas Lose, MD  Diagnosis and Prior Radiotherapy:     ICD-9-CM ICD-10-CM   1. Malignant neoplasm of upper-outer quadrant of left breast in female, estrogen receptor positive ([place]) 174.4 C50.412    V86.0 Z17.0     Stage IA T1cN0M0 left breast invasive ductal carcinoma ER / PR + HER2 neg, Grade 3  Radiation treatment dates:   06-18-16 to 07-30-16  Site/dose/beams/energy:     1) Left Breast / 45 Gy in 25 fractions ; prone tangents, 3D conformal  / 10 MV 2) Left Breast Boost /  6 Gy in 3 fractions ; 3 field photons / 10  and 6MV  CHIEF COMPLAINT: Here for follow-up and surveillance of left breast cancer  Narrative:  The patient returns today for routine follow-up of radiation completed 07/30/16 to her Left breast. She denies pain currently, but does report occasional "sharp" pain over her left breast. Her left breast is hyperpigmented. She does have dry skin present with no drainage present. She continues to use Radiaplex to her left breast and knows to change to a Vitamin E lotion when the radiaplex is finished. She has not started her Letrozole prescribed by Dr. Lindi Adie yet, but plans to pick it up today. She has no other concerns at this time. She has recently seen her heart doctor for heart palpitations.                          ALLERGIES:  is allergic to cortisone and lexapro [escitalopram].  Meds: Current Outpatient Prescriptions  Medication Sig Dispense Refill  . ALPRAZolam (XANAX) 0.25 MG tablet TAKE ONE TABLET BY MOUTH TWICE DAILY AS NEEDED FOR ANXIETY 30 tablet 0  . aspirin EC 81 MG tablet Take 81 mg by mouth daily.    . calcium citrate-vitamin D (CITRACAL+D) 315-200 MG-UNIT per tablet Take 1 tablet by mouth daily.    . hydrochlorothiazide (HYDRODIURIL) 25 MG tablet  Take 0.5 tablets (12.5 mg total) by mouth daily. 90 tablet 1  . ketoconazole (NIZORAL) 2 % cream     . pantoprazole (PROTONIX) 40 MG tablet Take 1 tablet (40 mg total) by mouth daily. 30 tablet 11  . potassium chloride SA (K-DUR,KLOR-CON) 20 MEQ tablet Take 1 tablet (20 mEq total) by mouth daily. 90 tablet 1  . venlafaxine XR (EFFEXOR XR) 37.5 MG 24 hr capsule Take 1 capsule (37.5 mg total) by mouth daily with breakfast. 30 capsule 1  . letrozole (FEMARA) 2.5 MG tablet Take 1 tablet (2.5 mg total) by mouth daily. (Patient not taking: Reported on 08/31/2016) 90 tablet 3   No current facility-administered medications for this encounter.     Physical Findings:  height is '5\' 4"'$  (1.626 m) and weight is 307 lb 6.4 oz (139.4 kg) (abnormal). Her temperature is 97.9 F (36.6 C). Her blood pressure is 129/61 and her pulse is 80. Her oxygen saturation is 100%. .    General: Alert and oriented, in no acute distress Breast: She still has some dryness over the middle portion of the left breast. She has patches of hyperpigmentation over the left breast. The inframammary fold is healing well and skin is intact.    Lab Findings: Lab Results  Component Value Date   WBC 2.9 (L)  08/29/2016   HGB 11.2 (L) 08/29/2016   HCT 33.9 (L) 08/29/2016   MCV 95.0 08/29/2016   PLT 153 08/29/2016    Radiographic Findings: No results found.  Impression/Plan:  Stage IA T1cN0M0 left breast invasive ductal carcinoma ER / PR + HER2 neg, Grade 3  I encouraged her to continue with yearly mammography and followup with medical oncology and pick up her letrozole Rx. She will continue to follow regularly with cardiology. I encouraged the patient to begin using Vitamin E oil and to apply this three times daily for more healing of the breast.. I will see her back on an as-needed basis. I have encouraged her to call if she has any issues or concerns in the future. I wished her the very best.   _____________________________________   Eppie Gibson, MD   This document serves as a record of services personally performed by Eppie Gibson, MD. It was created on her behalf by Arlyce Harman, a trained medical scribe. The creation of this record is based on the scribe's personal observations and the provider's statements to them. This document has been checked and approved by the attending provider.

## 2016-09-14 ENCOUNTER — Other Ambulatory Visit: Payer: Self-pay | Admitting: Family Medicine

## 2016-09-14 NOTE — Telephone Encounter (Signed)
Called in med 

## 2016-09-14 NOTE — Telephone Encounter (Signed)
Is this okay to refill? 

## 2016-09-14 NOTE — Telephone Encounter (Signed)
Ok to refill 

## 2016-09-28 ENCOUNTER — Ambulatory Visit: Payer: Medicare Other | Admitting: Family Medicine

## 2016-10-04 ENCOUNTER — Ambulatory Visit (INDEPENDENT_AMBULATORY_CARE_PROVIDER_SITE_OTHER): Payer: Medicare Other | Admitting: Family Medicine

## 2016-10-04 NOTE — Progress Notes (Signed)
Did not see patient.

## 2016-10-11 ENCOUNTER — Telehealth: Payer: Self-pay | Admitting: Cardiology

## 2016-10-11 NOTE — Telephone Encounter (Signed)
Returned call to patient Patient has chest pain all the time   - described as sharp pain, she feels her "heart dancing" - takes xanax when she experiences this with relief  - sometimes has SOB, lightheadedness/dizziness She can't drink milk or eat cheese - asks if eating dairy can mean she has a bad valve - explained this is not correlated to my knowledge She states she coughs so much she almost spits up from this - feels like throat could close up She states her symptoms have been going on prior to Nov 2017 Patient states she has been told her symptoms are not cardiac related/nothing wrong with her heart  Saw B. Rosita Fire, [place] in Nov 2017 - PPI Rx'ed - she states this does not help  She was scheduled to see her PCP on Jan 25 and patient states they refused to see her cause she was coughing? - she left the office without being seen b/c she had to catch the bus per appt notes  Patient scheduled Feb 6 with B. Rosita Fire, [place] - she wants to be seen at [facility] given proximity to her home  Advised I will send message to Dr. [name] for any advice

## 2016-10-11 NOTE — Telephone Encounter (Signed)
New Message  Pt c/o of Chest Pain: STAT if CP now or developed within 24 hours  1. Are you having CP right now? no  2. Are you experiencing any other symptoms (ex. SOB, nausea, vomiting, sweating)? no  3. How long have you been experiencing CP? Per pt since she started going to the doctor   4. Is your CP continuous or coming and going? Coming and going  5. Have you taken Nitroglycerin? No  Pt was make an appt for 2/6 with B. Simmons; per pt request for location. Please call back to discuss  ?

## 2016-10-11 NOTE — Telephone Encounter (Signed)
She should schedule to be seen with her PCP.

## 2016-10-12 NOTE — Telephone Encounter (Signed)
Patient scheduled to see B. Rosita Fire, [place] on Feb 6

## 2016-10-14 ENCOUNTER — Other Ambulatory Visit: Payer: Self-pay | Admitting: Podiatry

## 2016-10-16 ENCOUNTER — Ambulatory Visit (INDEPENDENT_AMBULATORY_CARE_PROVIDER_SITE_OTHER): Payer: Medicare Other | Admitting: Cardiology

## 2016-10-16 ENCOUNTER — Encounter: Payer: Self-pay | Admitting: Cardiology

## 2016-10-16 VITALS — BP 154/80 | HR 78 | Ht 64.0 in | Wt 303.8 lb

## 2016-10-16 DIAGNOSIS — R002 Palpitations: Secondary | ICD-10-CM

## 2016-10-16 HISTORY — DX: Palpitations: R00.2

## 2016-10-16 NOTE — Progress Notes (Signed)
10/16/2016 [name]   [birth year]  [id number]  Primary Physician Harland Dingwall, NP Primary Cardiologist: Dr. Warren Lacy   Reason for Visit/CC: Palpitations   HPI:  Ms. Luff is a 66 y/o obese female with h/o HTN, HLD tobacco abuse (quit in 2015) and left sided breast CA treated with radiation, presenting to clinic with a complaint of chest pain. Patient had a High risk stress nuclear study 10/14/2014 with a large, severe, reversible inferior defect consistent with severe inferior ischemia. On 10/21/14, she was referred for definitive LHC. This was performed by Dr. Angelena Form. She was found to have no angiographic evidence of CAD. She was noted to have normal LV systolic function. Her CP was ruled as non-cardiac.  She presents back to clinic today with a complaint of palpitations. She was walking up the stairs to her daughter's appt the other day when she developed tachy palpitations, she was also short of breath when this happened. She had mild associated chest discomfort. Symptoms resolved with rest. She notes prior h/o palpitations but has never worn an ambulatory monitor. Her EKG today shows SR with PVCs. HR is 86 bpm. She is current asymptomatic.   Current Meds  Medication Sig  . ALPRAZolam (XANAX) 0.25 MG tablet TAKE ONE TABLET BY MOUTH TWICE DAILY AS NEEDED FOR ANXIETY  . aspirin EC 81 MG tablet Take 81 mg by mouth daily.  . calcium citrate-vitamin D (CITRACAL+D) 315-200 MG-UNIT per tablet Take 1 tablet by mouth daily.  . hydrochlorothiazide (HYDRODIURIL) 25 MG tablet Take 0.5 tablets (12.5 mg total) by mouth daily.  Marland Kitchen ketoconazole (NIZORAL) 2 % cream   . letrozole (FEMARA) 2.5 MG tablet Take 1 tablet (2.5 mg total) by mouth daily.  . pantoprazole (PROTONIX) 40 MG tablet Take 1 tablet (40 mg total) by mouth daily.   Allergies  Allergen Reactions  . Cortisone Rash  . Effexor [Venlafaxine] Palpitations  . Lexapro [Escitalopram] Palpitations    Heart racing   Past Medical  History:  Diagnosis Date  . Anxiety   . Arthritis   . Breast cancer of upper-outer quadrant of left female breast ([place]) 01/31/2016  . Colon polyps   . Enlarged LA (left atrium)   . GERD (gastroesophageal reflux disease)   . Hard of hearing    Wears hearing aids.  . Headache   . Heavy cigarette smoker    Quit 2015  . History of radiation therapy 06/18/16- 07/30/16   Left Breast 45 Gy in 25 fractions, Left Breast Boost 6 Gy in 3 fractions.   . Hyperlipidemia   . Hypertension   . Morbid obesity ([place])   . Numbness and tingling   . Obesity   . Thyroid goiter   . Vertigo    on occasion   Family History  Problem Relation Age of Onset  . Hypertension Mother   . Heart disease Mother     Pacemaker  . Kidney disease Mother   . Cataracts Mother   . Hearing loss Mother   . Diabetes Mother   . Liver disease Mother   . Diabetes Sister   . Cataracts Brother   . Hearing loss Brother   . Colon cancer Neg Hx   . Thyroid disease Neg Hx    Past Surgical History:  Procedure Laterality Date  . BREAST LUMPECTOMY WITH RADIOACTIVE SEED AND SENTINEL LYMPH NODE BIOPSY Left 04/11/2016   Procedure: BREAST LUMPECTOMY WITH RADIOACTIVE SEED AND SENTINEL LYMPH NODE BIOPSY;  Surgeon: Excell Seltzer, MD;  Location: [facility];  Service: General;  Laterality: Left;  . COLONOSCOPY WITH PROPOFOL N/A 10/20/2015   Procedure: COLONOSCOPY WITH PROPOFOL;  Surgeon: Mauri Pole, MD;  Location: [facility];  Service: Endoscopy;  Laterality: N/A;  . ESOPHAGOGASTRODUODENOSCOPY (EGD) WITH PROPOFOL N/A 10/20/2015   Procedure: ESOPHAGOGASTRODUODENOSCOPY (EGD) WITH PROPOFOL;  Surgeon: Mauri Pole, MD;  Location: [facility];  Service: Endoscopy;  Laterality: N/A;  . LEFT HEART CATHETERIZATION WITH CORONARY ANGIOGRAM N/A 10/21/2014   Procedure: LEFT HEART CATHETERIZATION WITH CORONARY ANGIOGRAM;  Surgeon: Burnell Blanks, MD;  Location[facility] CATH LAB;  Service: Cardiovascular;  Laterality: N/A;  . TUBAL  LIGATION     Social History   Social History  . Marital status: Single    Spouse name: N/A  . Number of children: 1  . Years of education: 75   Occupational History  . Retired    Social History Main Topics  . Smoking status: Former Smoker    Packs/day: 1.00    Years: 45.00    Types: Cigarettes    Quit date: 07/11/2014  . Smokeless tobacco: Never Used  . Alcohol use No  . Drug use: No  . Sexual activity: Not on file   Other Topics Concern  . Not on file   Social History Narrative   Lives with daughter.    Right-hand.   Occasional use of caffeine.      Review of Systems: General: negative for chills, fever, night sweats or weight changes.  Cardiovascular: negative for chest pain, dyspnea on exertion, edema, orthopnea, palpitations, paroxysmal nocturnal dyspnea or shortness of breath Dermatological: negative for rash Respiratory: negative for cough or wheezing Urologic: negative for hematuria Abdominal: negative for nausea, vomiting, diarrhea, bright red blood per rectum, melena, or hematemesis Neurologic: negative for visual changes, syncope, or dizziness All other systems reviewed and are otherwise negative except as noted above.   Physical Exam:  Blood pressure (!) 154/80, pulse 78, height 5\' 4"  (1.626 m), weight (!) 303 lb 12.8 oz (137.8 kg), SpO2 99 %.   General appearance: alert, cooperative, no distress and morbidly obese Neck: no carotid bruit and no JVD Lungs: clear to auscultation bilaterally Heart: regular rate and rhythm, S1, S2 normal, no murmur, click, rub or gallop Extremities: mild LEE Pulses: 2+ and symmetric Skin: Skin color, texture, turgor normal. No rashes or lesions Neurologic: Grossly normal   EKG NSR with PVCs  ASSESSMENT AND PLAN:   1. Palpitations: currently asymptomatic but notes recent h/o tachy palpitations w/ dysypnea. She had a normal cath in 2016. EKG today shows NSR with PVCs. We will order a 30 day monitor to further assess.     PLAN  F/u in 4-5 weeks after 30 day monitor.   Azaria Bartell PA-C 10/16/2016 11:21 AM

## 2016-10-16 NOTE — Patient Instructions (Signed)
Medication Instructions:   Your physician recommends that you continue on your current medications as directed. Please refer to the Current Medication list given to you today.   If you need a refill on your cardiac medications before your next appointment, please call your pharmacy.  Labwork: NONE ORDERED  TODAY    Testing/Procedures:Your physician has recommended that you wear an event monitor. Event monitors are medical devices that record the heart's electrical activity. Doctors most often [place] these monitors to diagnose arrhythmias. Arrhythmias are problems with the speed or rhythm of the heartbeat. The monitor is a small, portable device. You can wear one while you do your normal daily activities. This is usually used to diagnose what is causing palpitations/syncope (passing out).     Follow-Up: IN 5 TO 6 WEEKS WITH SIMMONS    Any Other Special Instructions Will Be Listed Below (If Applicable).

## 2016-10-16 NOTE — Addendum Note (Signed)
Addended by: Zebedee Iba on: 10/16/2016 03:09 PM   Modules accepted: Orders

## 2016-10-18 ENCOUNTER — Other Ambulatory Visit: Payer: Self-pay | Admitting: Cardiology

## 2016-10-18 ENCOUNTER — Ambulatory Visit (INDEPENDENT_AMBULATORY_CARE_PROVIDER_SITE_OTHER): Payer: Medicare Other

## 2016-10-18 DIAGNOSIS — R002 Palpitations: Secondary | ICD-10-CM

## 2016-10-18 DIAGNOSIS — R0609 Other forms of dyspnea: Secondary | ICD-10-CM | POA: Diagnosis not present

## 2016-10-24 ENCOUNTER — Other Ambulatory Visit: Payer: Self-pay | Admitting: Podiatry

## 2016-11-01 ENCOUNTER — Telehealth: Payer: Self-pay | Admitting: *Deleted

## 2016-11-01 NOTE — Telephone Encounter (Addendum)
Pt request a refill of a expired medication. I reviewed our Medication orders and no [facility] doctors have prescribed pt a medication in at least 3 years. Unable to leave message phone line was busy.11/05/2016-Pt called states no one called about her hand cream that expired. 11/06/2016-Unable to leave a message phone required remote access code. I informed pt, she needed an appt if she was continuing to have a problem with the skin on her feet and hands. Transferred pt to schedulers.

## 2016-11-14 ENCOUNTER — Ambulatory Visit: Payer: Medicare Other | Admitting: Podiatry

## 2016-11-16 ENCOUNTER — Other Ambulatory Visit: Payer: Self-pay

## 2016-11-21 ENCOUNTER — Ambulatory Visit: Payer: Self-pay | Admitting: Endocrinology

## 2016-11-22 ENCOUNTER — Encounter: Payer: Self-pay | Admitting: Podiatry

## 2016-11-22 ENCOUNTER — Ambulatory Visit (INDEPENDENT_AMBULATORY_CARE_PROVIDER_SITE_OTHER): Payer: Medicare Other | Admitting: Podiatry

## 2016-11-22 DIAGNOSIS — B353 Tinea pedis: Secondary | ICD-10-CM | POA: Diagnosis not present

## 2016-11-22 MED ORDER — KETOCONAZOLE 2 % EX CREA: TOPICAL_CREAM | Freq: Every day | CUTANEOUS | 0 refills | Status: DC | PRN

## 2016-11-23 NOTE — Progress Notes (Signed)
Subjective:     Patient ID: Sheila Jacobs, female   DOB: 1951/01/20, 66 y.o.   MRN: [id number]  HPI patient presents with a lot of skin irritation between the toes with itching mechanism now   Review of Systems     Objective:   Physical Exam Neurovascular status intact with patient noted to have irritation between the lesser digits and thick nailbeds that responded well to topical treatment and past    Assessment:     Fungal infection of nails and skin    Plan:     At this time went ahead and we'll start ketoconazole 2% and will continue with skin treatments that she does

## 2016-11-24 ENCOUNTER — Telehealth: Payer: Self-pay | Admitting: Adult Health

## 2016-11-24 NOTE — Telephone Encounter (Signed)
Left message with new appt time and date for SCP . Patient has appt scheduled for 3/20 - put note in on appt day to give patient new appt scheduled for SCP

## 2016-11-27 ENCOUNTER — Ambulatory Visit: Payer: Self-pay | Admitting: Hematology and Oncology

## 2016-11-27 NOTE — Assessment & Plan Note (Deleted)
Left lumpectomy 04/11/2016: IDC grade 3, 1.2 cm, IG DCIS, LVI present, 0/8 lymph nodes negative, ER 100%, PR 90%, HER-2 negative, Ki-67 10%, T1 cN0 stage I a pathologic stage  Adj XRT 06/18/16 to 07/30/16  Current treatment: Adj Anti-estrogen therapy with Letrozole 2.5 mg daily started 08/29/2016  Letrozole toxicities:  Return to clinic in 6 months for follow-up after that we can see her once a year

## 2016-11-28 ENCOUNTER — Telehealth: Payer: Self-pay | Admitting: Hematology and Oncology

## 2016-11-28 NOTE — Telephone Encounter (Signed)
lvm to inform pt of r/s appt to 3/29 at 1045 am per LOS

## 2016-11-29 ENCOUNTER — Telehealth: Payer: Self-pay | Admitting: Emergency Medicine

## 2016-11-29 NOTE — Telephone Encounter (Signed)
Called pt and confirmed appt next Thurday 3/29 at 10:45. Pt verbally read back time/date for next week.

## 2016-11-29 NOTE — Telephone Encounter (Signed)
Received voicemail from patient requesting appointment with Dr Lindi Adie. Message sent to desk nurse to follow up.

## 2016-12-05 ENCOUNTER — Encounter: Payer: Self-pay | Admitting: Cardiology

## 2016-12-05 ENCOUNTER — Ambulatory Visit (INDEPENDENT_AMBULATORY_CARE_PROVIDER_SITE_OTHER): Payer: Medicare Other | Admitting: Cardiology

## 2016-12-05 VITALS — BP 152/78 | HR 92 | Ht 64.0 in | Wt 299.0 lb

## 2016-12-05 DIAGNOSIS — R002 Palpitations: Secondary | ICD-10-CM | POA: Diagnosis not present

## 2016-12-05 NOTE — Patient Instructions (Signed)

## 2016-12-05 NOTE — Progress Notes (Signed)
12/05/2016 NOA GALVAO   [birth year]  [id number]  Primary Physician Harland Dingwall, NP-C Primary Cardiologist: Dr. [name]   Reason for Visit/CC: Palpitations; F/u after Heart Monitor  HPI:  Ms. Dias is a 66 y/o obese female with h/o HTN, HLD tobacco abuse (quit in 2015) and left sided breast CA treated with radiation. She was seen back in 2016 for chest pain. Patient had a High risk stress nuclear study 2/4/2016with a large, severe, reversible inferior defect consistent with severe inferior ischemia. On 10/21/14, she was referred for definitive LHC. This was performed by Dr. Angelena Form. She was found to have no angiographic evidence of CAD. She was noted to have normal LV systolic function. Her CP was ruled as non-cardiac.  I evaluated her 10/16/16 with when presented with complaint of frequent palpitations. Her EKG at that visit showed NSR with PVCs. HR was in the 80s. I ordered a 30 day monitor. This was reviewed by Dr. [name]. This showed NSR with a few PVCs. No high risk findings or sustained arrhthymias. Dr. [name] outlined in his report note, "No significant sustained arrhythmias. She does have PVCs. However, I would not suggest a change in therapy for these".  She presents to clinic today for f/u. She continues to note periodic palpations,1-2 beats, but no sustained symptoms. Pt was notified and reassured of her monitor results.  VSS in clinic today. She is currently asymptomatic.   Current Meds  Medication Sig  . ALPRAZolam (XANAX) 0.25 MG tablet TAKE ONE TABLET BY MOUTH TWICE DAILY AS NEEDED FOR ANXIETY  . aspirin EC 81 MG tablet Take 81 mg by mouth daily.  . calcium citrate-vitamin D (CITRACAL+D) 315-200 MG-UNIT per tablet Take 1 tablet by mouth daily.  . hydrochlorothiazide (HYDRODIURIL) 25 MG tablet Take 0.5 tablets (12.5 mg total) by mouth daily.  Marland Kitchen ketoconazole (NIZORAL) 2 % cream Apply topically daily as needed for irritation.  Marland Kitchen letrozole (FEMARA) 2.5 MG tablet  Take 1 tablet (2.5 mg total) by mouth daily.  . pantoprazole (PROTONIX) 40 MG tablet Take 1 tablet (40 mg total) by mouth daily.   Allergies  Allergen Reactions  . Cortisone Rash  . Effexor [Venlafaxine] Palpitations  . Lexapro [Escitalopram] Palpitations    Heart racing   Past Medical History:  Diagnosis Date  . Anxiety   . Arthritis   . Breast cancer of upper-outer quadrant of left female breast ([place]) 01/31/2016  . Colon polyps   . Enlarged LA (left atrium)   . GERD (gastroesophageal reflux disease)   . Hard of hearing    Wears hearing aids.  . Headache   . Heavy cigarette smoker    Quit 2015  . History of radiation therapy 06/18/16- 07/30/16   Left Breast 45 Gy in 25 fractions, Left Breast Boost 6 Gy in 3 fractions.   . Hyperlipidemia   . Hypertension   . Morbid obesity ([place])   . Numbness and tingling   . Obesity   . Thyroid goiter   . Vertigo    on occasion   Family History  Problem Relation Age of Onset  . Hypertension Mother   . Heart disease Mother     Pacemaker  . Kidney disease Mother   . Cataracts Mother   . Hearing loss Mother   . Diabetes Mother   . Liver disease Mother   . Diabetes Sister   . Cataracts Brother   . Hearing loss Brother   . Colon cancer Neg Hx   . Thyroid disease  Neg Hx    Past Surgical History:  Procedure Laterality Date  . BREAST LUMPECTOMY WITH RADIOACTIVE SEED AND SENTINEL LYMPH NODE BIOPSY Left 04/11/2016   Procedure: BREAST LUMPECTOMY WITH RADIOACTIVE SEED AND SENTINEL LYMPH NODE BIOPSY;  Surgeon: Excell Seltzer, MD;  Location: [place];  Service: General;  Laterality: Left;  . COLONOSCOPY WITH PROPOFOL N/A 10/20/2015   Procedure: COLONOSCOPY WITH PROPOFOL;  Surgeon: Mauri Pole, MD;  Location: [facility];  Service: Endoscopy;  Laterality: N/A;  . ESOPHAGOGASTRODUODENOSCOPY (EGD) WITH PROPOFOL N/A 10/20/2015   Procedure: ESOPHAGOGASTRODUODENOSCOPY (EGD) WITH PROPOFOL;  Surgeon: Mauri Pole, MD;  Location: [facility];   Service: Endoscopy;  Laterality: N/A;  . LEFT HEART CATHETERIZATION WITH CORONARY ANGIOGRAM N/A 10/21/2014   Procedure: LEFT HEART CATHETERIZATION WITH CORONARY ANGIOGRAM;  Surgeon: Burnell Blanks, MD;  Location: [facility] CATH [facility];  Service: Cardiovascular;  Laterality: N/A;  . TUBAL LIGATION     Social History   Social History  . Marital status: Single    Spouse name: N/A  . Number of children: 1  . Years of education: 57   Occupational History  . Retired    Social History Main Topics  . Smoking status: Former Smoker    Packs/day: 1.00    Years: 45.00    Types: Cigarettes    Quit date: 07/11/2014  . Smokeless tobacco: Never Used  . Alcohol use No  . Drug use: No  . Sexual activity: Not on file   Other Topics Concern  . Not on file   Social History Narrative   Lives with daughter.    Right-hand.   Occasional use of caffeine.      Review of Systems: General: negative for chills, fever, night sweats or weight changes.  Cardiovascular: negative for chest pain, dyspnea on exertion, edema, orthopnea, palpitations, paroxysmal nocturnal dyspnea or shortness of breath Dermatological: negative for rash Respiratory: negative for cough or wheezing Urologic: negative for hematuria Abdominal: negative for nausea, vomiting, diarrhea, bright red blood per rectum, melena, or hematemesis Neurologic: negative for visual changes, syncope, or dizziness All other systems reviewed and are otherwise negative except as noted above.   Physical Exam:  Blood pressure (!) 152/78, pulse 92, height 5\' 4"  (1.626 m), weight 299 lb (135.6 kg).  General appearance: alert, cooperative, no distress and morbidly obese Neck: no carotid bruit and no JVD Lungs: clear to auscultation bilaterally Heart: regular rate and rhythm, S1, S2 normal, no murmur, click, rub or gallop Extremities: extremities normal, atraumatic, no cyanosis or edema Pulses: 2+ and symmetric Skin: Skin color, texture, turgor  normal. No rashes or lesions Neurologic: Grossly normal  EKG not performed today. Recent 30 day monitor results reviewed.   ASSESSMENT AND PLAN:   1. Palpitations: 30 day monitor showed NSR with a few PVCs. No sustained arrhthymias. Per Dr. Rosezella [place] recommendations, no need for change in therapy. In addition, the patient had a normal cardiac cath in 2016. No need for any additional cardiac w/u. Pt can f/u with her PCP.     Lyda Jester PA-C 12/05/2016 11:14 AM

## 2016-12-06 ENCOUNTER — Ambulatory Visit: Payer: Self-pay | Admitting: Hematology and Oncology

## 2016-12-06 NOTE — Assessment & Plan Note (Deleted)
Left lumpectomy 04/11/2016: IDC grade 3, 1.2 cm, IG DCIS, LVI present, 0/8 lymph nodes negative, ER 100%, PR 90%, HER-2 negative, Ki-67 10%, T1 cN0 stage I a pathologic stage  Adj XRT 06/18/16 to 07/30/16  Treatment Plan: Adj Anti-estrogen therapy with Letrozole 2.5 mg daily started 08/29/2016  Letrozole toxicities:  Return to clinic in 6 months for follow-up

## 2016-12-10 ENCOUNTER — Other Ambulatory Visit (INDEPENDENT_AMBULATORY_CARE_PROVIDER_SITE_OTHER): Payer: Medicare Other

## 2016-12-10 DIAGNOSIS — E012 Iodine-deficiency related (endemic) goiter, unspecified: Secondary | ICD-10-CM | POA: Diagnosis not present

## 2016-12-10 LAB — TSH: TSH: 0.55 u[IU]/mL (ref 0.35–4.50)

## 2016-12-10 LAB — T4, FREE: Free T4: 1.02 ng/dL (ref 0.60–1.60)

## 2016-12-10 LAB — T3, FREE: T3, Free: 3.2 pg/mL (ref 2.3–4.2)

## 2016-12-13 ENCOUNTER — Ambulatory Visit (INDEPENDENT_AMBULATORY_CARE_PROVIDER_SITE_OTHER): Payer: Medicare Other | Admitting: Endocrinology

## 2016-12-13 ENCOUNTER — Encounter: Payer: Self-pay | Admitting: Endocrinology

## 2016-12-13 VITALS — BP 130/84 | HR 83 | Ht 64.0 in | Wt 303.0 lb

## 2016-12-13 DIAGNOSIS — E041 Nontoxic single thyroid nodule: Secondary | ICD-10-CM

## 2016-12-13 NOTE — Progress Notes (Signed)
Patient ID: Sheila Jacobs, female   DOB: 08-Dec-1950, 66 y.o.   MRN: [id number]           Reason for Appointment: Goiter, follow-up    History of Present Illness:   The patient's thyroid enlargement was first discovered in 2009 probably on her routine physical exam  [facility]   She however does not know when her goiter first started    On her initial evaluation she did have needle biopsy of her large right-sided nodule which was benign   She has never been on thyroid supplements Clinically she was felt to have a colloid goiter, mostly right sided  She is here now for follow-up, last seen in September More recently she thinks she is getting symptoms of something similar to a choking feeling which occurs on and off but mostly daily This is also associated with coughing which has been going on more recently She thinks that sometimes this is associated with the feeling of a thump in her chest She does not have any difficulty swallowing   She has had mild decrease in TSH without increased free T4 and this is now back to normal   Lab Results  Component Value Date   FREET4 1.02 12/10/2016   FREET4 1.2 05/09/2016   FREET4 1.2 01/16/2016   TSH 0.55 12/10/2016   TSH 0.25 (L) 05/09/2016   TSH 0.26 (L) 01/16/2016    She has had an ultrasound exam in  2009  Thyroid biopsy in 2009  Showed nonneoplastic goiter    Allergies as of 12/13/2016      Reactions   Cortisone Rash   Effexor [venlafaxine] Palpitations   Lexapro [escitalopram] Palpitations   Heart racing      Medication List       Accurate as of 12/13/16  1:20 PM. Always use your most recent med list.          ALPRAZolam 0.25 MG tablet Commonly known as:  XANAX TAKE ONE TABLET BY MOUTH TWICE DAILY AS NEEDED FOR ANXIETY   aspirin EC 81 MG tablet Take 81 mg by mouth daily.   calcium citrate-vitamin D 315-200 MG-UNIT tablet Commonly known as:  CITRACAL+D Take 1 tablet by  mouth daily.   hydrochlorothiazide 25 MG tablet Commonly known as:  HYDRODIURIL Take 0.5 tablets (12.5 mg total) by mouth daily.   ketoconazole 2 % cream Commonly known as:  NIZORAL Apply topically daily as needed for irritation.   letrozole 2.5 MG tablet Commonly known as:  FEMARA Take 1 tablet (2.5 mg total) by mouth daily.   pantoprazole 40 MG tablet Commonly known as:  PROTONIX Take 1 tablet (40 mg total) by mouth daily.       Allergies:  Allergies  Allergen Reactions  . Cortisone Rash  . Effexor [Venlafaxine] Palpitations  . Lexapro [Escitalopram] Palpitations    Heart racing    Past Medical History:  Diagnosis Date  . Anxiety   . Arthritis   . Breast cancer of upper-outer quadrant of left female breast ([place]) 01/31/2016  . Colon polyps   . Enlarged LA (left atrium)   . GERD (gastroesophageal reflux disease)   . Hard of hearing    Wears hearing aids.  . Headache   . Heavy cigarette smoker    Quit 2015  . History of radiation therapy 06/18/16- 07/30/16   Left Breast 45 Gy in 25 fractions, Left Breast Boost 6 Gy in 3 fractions.   Marland Kitchen  Hyperlipidemia   . Hypertension   . Morbid obesity ([place])   . Numbness and tingling   . Obesity   . Thyroid goiter   . Vertigo    on occasion    Past Surgical History:  Procedure Laterality Date  . BREAST LUMPECTOMY WITH RADIOACTIVE SEED AND SENTINEL LYMPH NODE BIOPSY Left 04/11/2016   Procedure: BREAST LUMPECTOMY WITH RADIOACTIVE SEED AND SENTINEL LYMPH NODE BIOPSY;  Surgeon: Excell Seltzer, MD;  Location: [place];  Service: General;  Laterality: Left;  . COLONOSCOPY WITH PROPOFOL N/A 10/20/2015   Procedure: COLONOSCOPY WITH PROPOFOL;  Surgeon: Mauri Pole, MD;  [facility];  Service: Endoscopy;  Laterality: N/A;  . ESOPHAGOGASTRODUODENOSCOPY (EGD) WITH PROPOFOL N/A 10/20/2015   Procedure: ESOPHAGOGASTRODUODENOSCOPY (EGD) WITH PROPOFOL;  Surgeon: Mauri Pole, MD;  [facility];  Service: Endoscopy;   Laterality: N/A;  . LEFT HEART CATHETERIZATION WITH CORONARY ANGIOGRAM N/A 10/21/2014   Procedure: LEFT HEART CATHETERIZATION WITH CORONARY ANGIOGRAM;  Surgeon: Burnell Blanks, MD;  Location: [facility] C[facility] LAB;  Service: Cardiovascular;  Laterality: N/A;  . TUBAL LIGATION      Family History  Problem Relation Age of Onset  . Hypertension Mother   . Heart disease Mother     Pacemaker  . Kidney disease Mother   . Cataracts Mother   . Hearing loss Mother   . Diabetes Mother   . Liver disease Mother   . Diabetes Sister   . Cataracts Brother   . Hearing loss Brother   . Colon cancer Neg Hx   . Thyroid disease Neg Hx     Social History:  reports that she quit smoking about 2 years ago. Her smoking use included Cigarettes. She has a 45.00 pack-year smoking history. She has never used smokeless tobacco. She reports that she does not drink alcohol or use drugs.    Review of Systems  She apparently has been given Protonix for reflux, does not complain of heartburn She says that Protonix causes a headache and does not take it regularly   Examination:   BP 130/84   Pulse 83   Ht 5\' 4"  (1.626 m)   Wt (!) 303 lb (137.4 kg)   SpO2 97%   BMI 52.01 kg/m           The thyroid is enlarged Significantly on the right side, about 4-5 times normal, smooth, relatively soft. Left-sided minimally enlarged Neck circumference is 43.5 cm over the thyroid, has some tracheal deviation to the left There is no stridor. Pemberton sign is negative  There is no lymphadenopathy in the neck .     REFLEXES: at biceps are normal.    Assessment/Plan:   Long-standing goiter with mostly right Lobe enlargement She has had a large soft goiter but by measurement it appears to be increased in size She is having some nonspecific symptoms that sound like she is having difficulty breathing at times but she is also having cough at bedtime She thinks she is getting some skipping of her heart but apparently has  had some cardiac evaluation which was negative  Although previously her TSH was slightly low at is back to normal now  Patient also has had some cough but she does not think this is better with taking her reflux medication  Recommendations: Discussed that if she is having tracheal compression from her goiter she will potentially need surgery and will need to do a CT scan to evaluate this She is somewhat reluctant to consider  this now She was encouraged to follow-up with her primary care physician to evaluate her cough Since she reports some intolerance to Protonix she may be able to use a different PPI   Danissa Rundle 12/13/2016

## 2016-12-20 ENCOUNTER — Ambulatory Visit
Admission: RE | Admit: 2016-12-20 | Discharge: 2016-12-20 | Disposition: A | Discharge status: Home / Self Care | Payer: Medicare Other | Source: Ambulatory Visit | Attending: Endocrinology | Admitting: Endocrinology

## 2016-12-20 DIAGNOSIS — E041 Nontoxic single thyroid nodule: Secondary | ICD-10-CM

## 2016-12-20 DIAGNOSIS — E049 Nontoxic goiter, unspecified: Secondary | ICD-10-CM | POA: Diagnosis not present

## 2016-12-20 IMAGING — CT CT NECK W/ CM
4 of 5 series · 16 of 33 positions shown, 19 images · IV contrast (75CC ISOVUE 300)
Comparison: None.

CLINICAL DATA: Right-sided goiter. Dysphagia. Pressure. History of
breast cancer.

Creatinine was obtained on site at [HOSPITAL] at [REDACTED].Results: Creatinine 0.9 mg/dL.
EXAM:
CT NECK WITH CONTRAST
TECHNIQUE: Multidetector CT imaging of the neck was performed using the
standard protocol following the bolus administration of intravenous
contrast.
CONTRAST:  75mL [6X] IOPAMIDOL ([6X]) INJECTION 61%

[Series 3: axial neck · axial · 0.51mm/px · z∈[-39,+81]mm · 3 of 98 slices shown]
[im 25/98  bone]
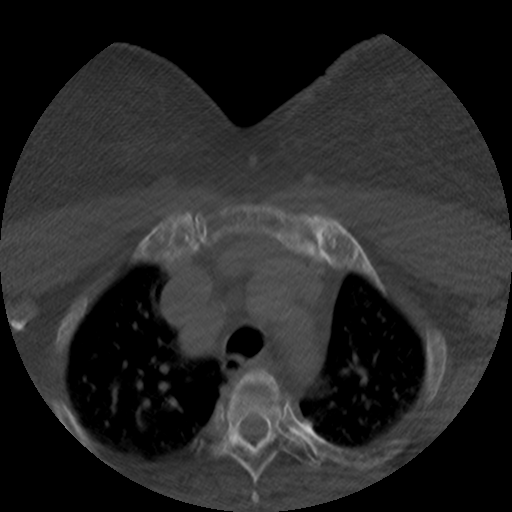
[im 49/98  bone]
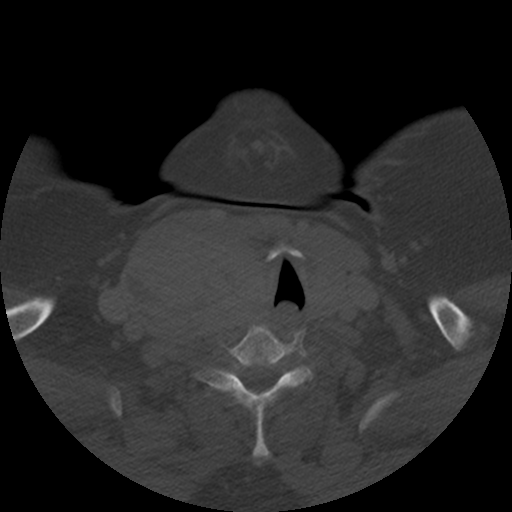
[im 73/98  bone]
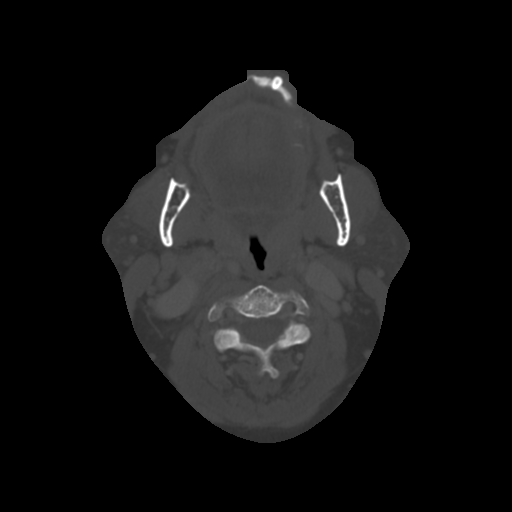

[Series 200: cor · coronal · 0.51mm/px · 3 of 118 slices shown]
[im 24/118  bone]
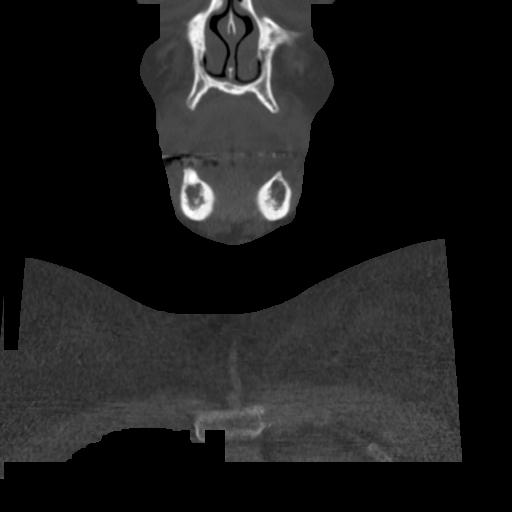
[im 47/118  bone]
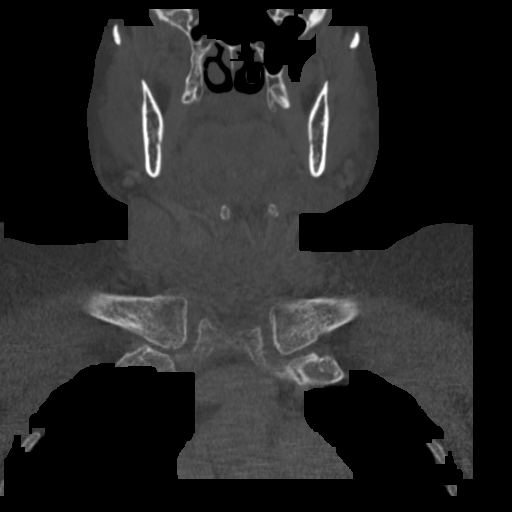
[im 71/118  bone]
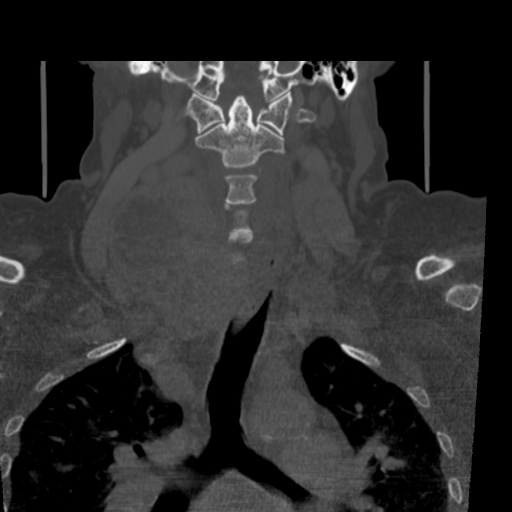

[Series 201: sag · sagittal · 0.51mm/px · 5 of 126 slices shown, 6 images]
[im 42/126  bone]
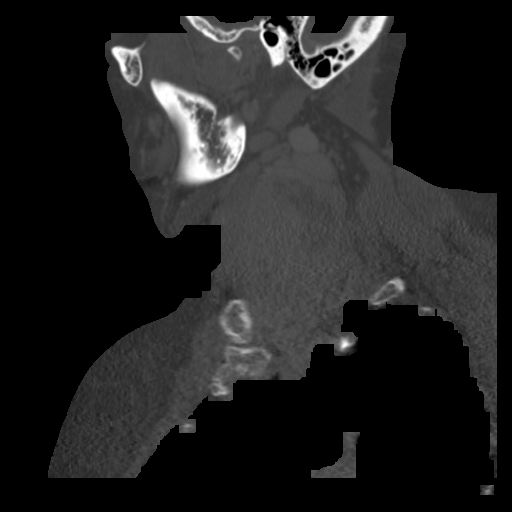
[im 53/126  bone]
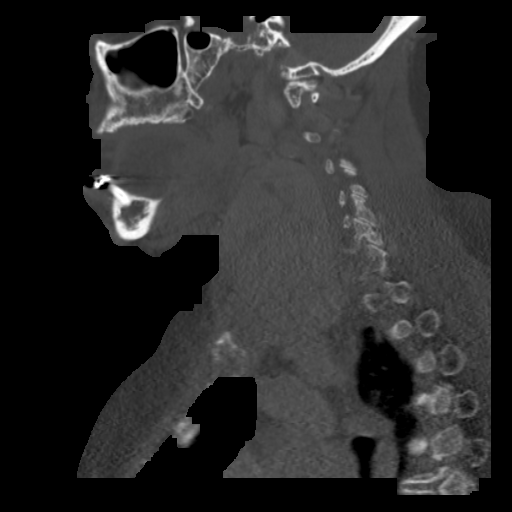
[im 63/126  soft-tissue]
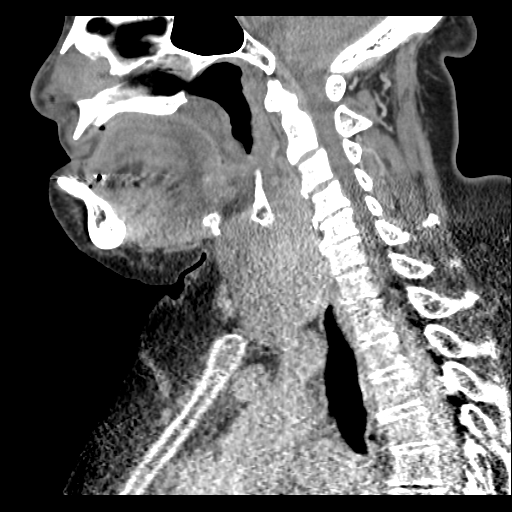
[im 63/126  bone]
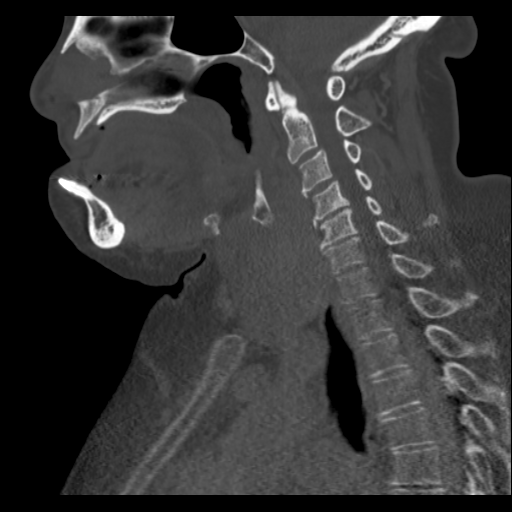
[im 73/126  bone]
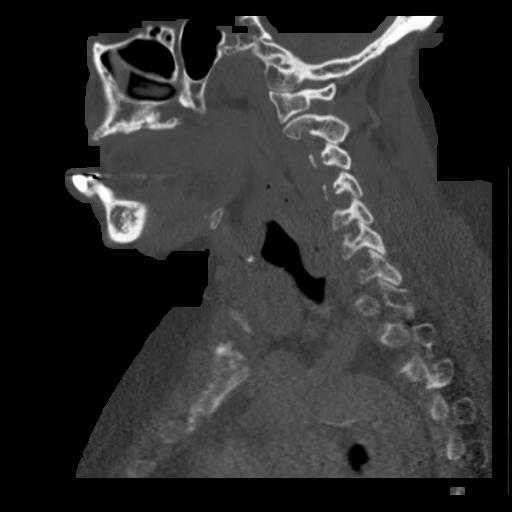
[im 84/126  bone]
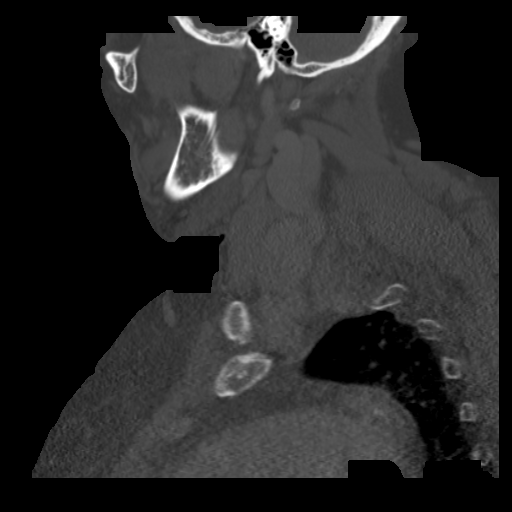

[Series 202: angled axial · axial · 0.51mm/px · z∈[-97,+63]mm · 5 of 126 slices shown, 7 images]
[im 21/126  soft-tissue]
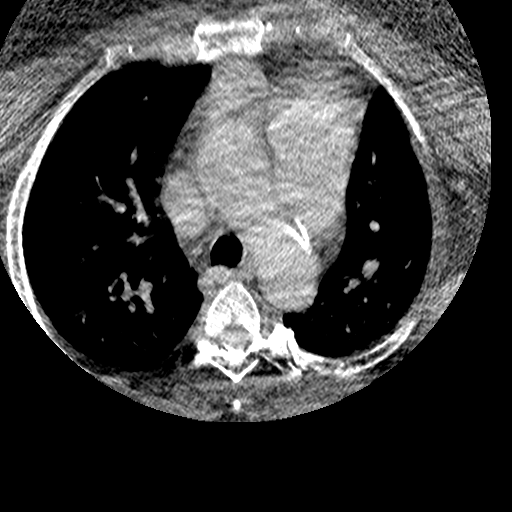
[im 21/126  bone]
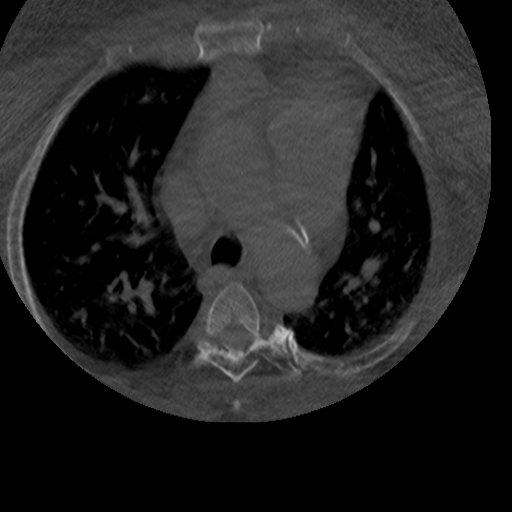
[im 42/126  bone]
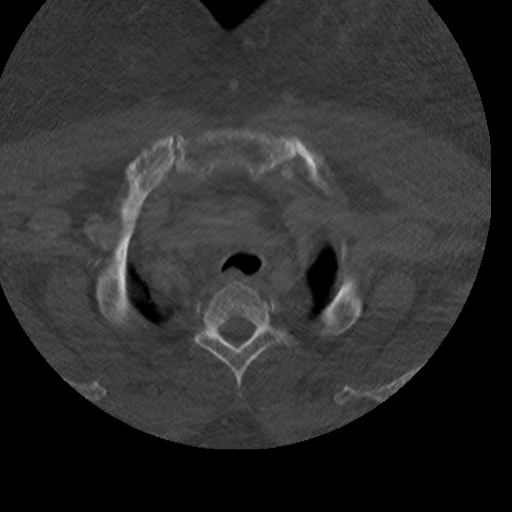
[im 63/126  bone]
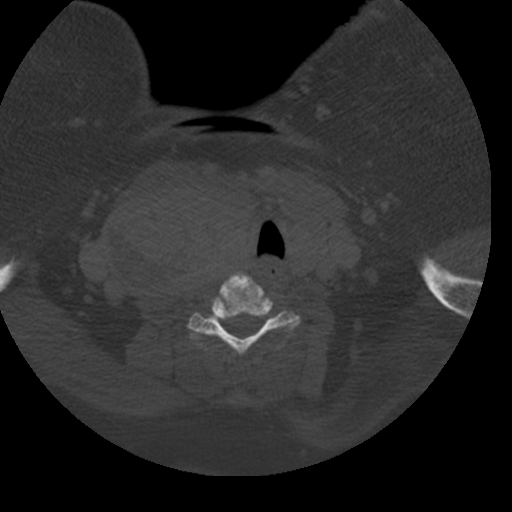
[im 84/126  bone]
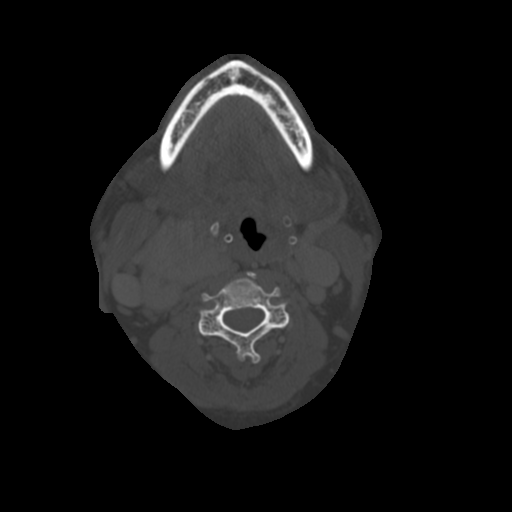
[im 105/126  soft-tissue]
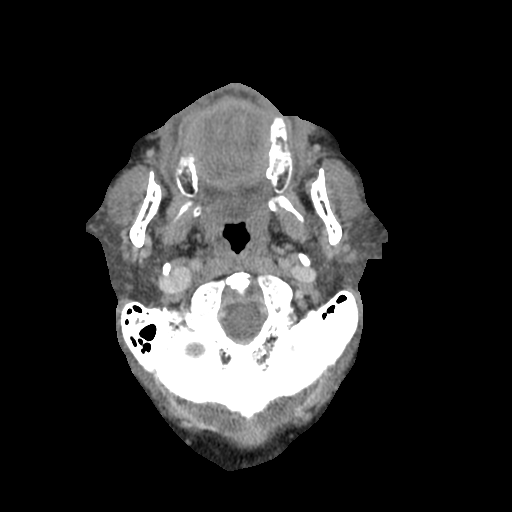
[im 105/126  bone]
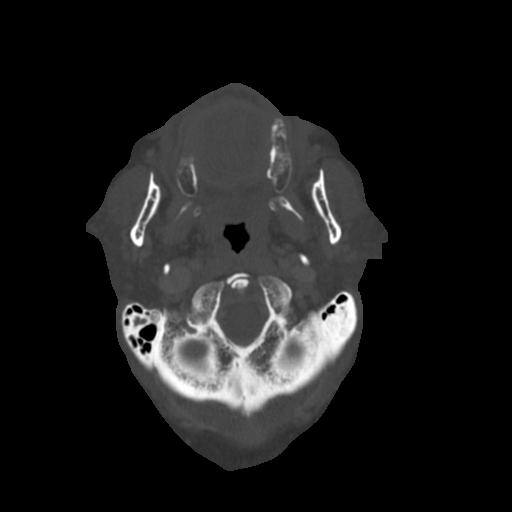

[16 of 33 positions shown; findings below may reference images not displayed]

FINDINGS: Pharynx and larynx: No mucosal or submucosal lesion.

Salivary glands: Parotid and submandibular glands are normal.

Thyroid: Massive thyroid goiter, right much more extensive than
left. Right lobe shows maximal transverse diameter of 5.6 x 7.8 cm
and shows a cephalo caudal extent of 9.4 cm. Mass effect upon the
neck structures with displacement of the larynx and other midline
neck structures towards the left.

Lymph nodes: No enlarged or low-density nodes on either side.

Vascular: Vascular displacement by the large goiter. No primary
vascular lesion.

Limited intracranial: Normal

Visualized orbits: Normal

Mastoids and visualized paranasal sinuses: Clear

Skeleton: Mild cervical spondylosis.

Upper chest: Normal

Other: None
IMPRESSION: Massive thyroid goiter right more than left. Right lobe measures
x 7.8 x 5.6 cm. No specific worrisome focal finding to suggest
malignancy. No abnormal lymph nodes.

## 2016-12-20 MED ORDER — IOPAMIDOL (ISOVUE-300) INJECTION 61%
75.0000 mL | Freq: Once | INTRAVENOUS | Status: AC | PRN
Start: 2016-12-20 — End: 2016-12-20
  Administered 2016-12-20: 75 mL via INTRAVENOUS

## 2016-12-26 ENCOUNTER — Telehealth: Payer: Self-pay | Admitting: Endocrinology

## 2016-12-26 NOTE — Telephone Encounter (Signed)
Patient calling for the results of her labs

## 2016-12-27 NOTE — Telephone Encounter (Signed)
Patient called back to speak to [facility]. Patient requested a call around 12p, she was busy. I advised patient that there was no guarantee on the time she would get a call back but Lattie Haw would try to get back to her when she could. Patient verbally expressed understanding.

## 2016-12-27 NOTE — Telephone Encounter (Signed)
Called patient again and she is still unable to hear me she stated she will call back from another phone

## 2016-12-27 NOTE — Progress Notes (Signed)
Please let patient know that the CAT scan shows very large thyroid enlargement on the right displacing the voice box and upper windpipe, recommend surgery.  If she is agreeing to this we can refer her

## 2016-12-27 NOTE — Telephone Encounter (Signed)
Please call the pt soon to further explain the lab and imaging results

## 2016-12-27 NOTE — Telephone Encounter (Signed)
Spoke to the patient and gave her results - she was having issues with her phone so she will be calling back

## 2016-12-27 NOTE — Telephone Encounter (Signed)
Results not released at this time

## 2017-01-01 NOTE — Telephone Encounter (Signed)
Unable to reach patient left a vm

## 2017-01-02 NOTE — Telephone Encounter (Signed)
Pt called back again to speak with Lattie Haw.

## 2017-01-07 ENCOUNTER — Other Ambulatory Visit: Payer: Self-pay | Admitting: Family Medicine

## 2017-01-07 ENCOUNTER — Other Ambulatory Visit: Payer: Self-pay | Admitting: Podiatry

## 2017-01-07 NOTE — Telephone Encounter (Signed)
Called in med 

## 2017-01-07 NOTE — Telephone Encounter (Signed)
Ok to refill 

## 2017-01-07 NOTE — Telephone Encounter (Signed)
Is this okay to refill? 

## 2017-01-07 NOTE — Telephone Encounter (Signed)
Patient ask to get a call from Dr Dwyane Dee, because he is not getting a call back from the nurse.

## 2017-01-07 NOTE — Telephone Encounter (Signed)
Attempted to reach the patient. Pateint was not available and did not have a working Advertising account executive. Will try again tomorrow.

## 2017-01-08 ENCOUNTER — Other Ambulatory Visit: Payer: Self-pay | Admitting: Endocrinology

## 2017-01-08 DIAGNOSIS — E041 Nontoxic single thyroid nodule: Secondary | ICD-10-CM

## 2017-01-08 NOTE — Telephone Encounter (Signed)
I contacted the patient and advised of message via voicemail. Requested a call back if the patient would like to discuss further.  

## 2017-01-08 NOTE — Telephone Encounter (Signed)
This was called in yesterday already for #30 with 1 refill to [facility]

## 2017-01-08 NOTE — Telephone Encounter (Signed)
There is no medication which will help the thyroid.  I will send a referral to the surgeon because she needs surgery

## 2017-01-08 NOTE — Telephone Encounter (Signed)
I contacted the patient this morning. She stated she would like to have a medication prescribed for her to help with the goiter laying against her wind pipe. She stated from time to time this causes her discomfort. Pateint also wanted to know if she should be referred to a surgery specialist to discuss having the goiter removed?

## 2017-01-18 NOTE — Telephone Encounter (Signed)
Pt called in and said that she is going to see another doctor on the 22nd for a second opinion and will let us know what they say.

## 2017-01-24 DIAGNOSIS — C50912 Malignant neoplasm of unspecified site of left female breast: Secondary | ICD-10-CM | POA: Diagnosis not present

## 2017-01-28 ENCOUNTER — Telehealth: Payer: Self-pay | Admitting: Endocrinology

## 2017-01-28 NOTE — Telephone Encounter (Signed)
Patient called to advise that she needs a medication for her goiter in her neck. Please call and advise patient on if this was discussed at her last visit. Patient was not sure of the medication and did not have specifics to give me.

## 2017-01-29 DIAGNOSIS — E048 Other specified nontoxic goiter: Secondary | ICD-10-CM | POA: Diagnosis not present

## 2017-01-29 DIAGNOSIS — E559 Vitamin D deficiency, unspecified: Secondary | ICD-10-CM | POA: Diagnosis not present

## 2017-01-29 DIAGNOSIS — K219 Gastro-esophageal reflux disease without esophagitis: Secondary | ICD-10-CM | POA: Diagnosis not present

## 2017-01-29 DIAGNOSIS — Z1389 Encounter for screening for other disorder: Secondary | ICD-10-CM | POA: Diagnosis not present

## 2017-01-29 DIAGNOSIS — I10 Essential (primary) hypertension: Secondary | ICD-10-CM | POA: Diagnosis not present

## 2017-01-29 NOTE — Telephone Encounter (Signed)
There is no medication for the goiter and she was told about this.  She does need to see the surgeon as surgery is the only treatment that will relieve her symptoms

## 2017-01-29 NOTE — Telephone Encounter (Signed)
Called patient and left a message of what Dr. Dwyane Dee stated in his note.

## 2017-02-05 ENCOUNTER — Telehealth: Payer: Self-pay | Admitting: Adult Health

## 2017-02-05 NOTE — Telephone Encounter (Signed)
Patient is having surgery and wanted to reschedule her appointment she is no to come in on 7/2 at 10:00am

## 2017-02-07 ENCOUNTER — Encounter: Payer: Self-pay | Admitting: Adult Health

## 2017-02-07 ENCOUNTER — Ambulatory Visit: Payer: Self-pay | Admitting: General Surgery

## 2017-02-07 DIAGNOSIS — E040 Nontoxic diffuse goiter: Secondary | ICD-10-CM | POA: Diagnosis not present

## 2017-02-12 ENCOUNTER — Telehealth: Payer: Self-pay

## 2017-02-12 NOTE — Telephone Encounter (Signed)
Records faxed to [facility] per pt request. Transfer of care. Sheila Jacobs

## 2017-02-20 ENCOUNTER — Other Ambulatory Visit: Payer: Self-pay | Admitting: Family Medicine

## 2017-02-22 NOTE — Patient Instructions (Addendum)
Sheila Jacobs  02/22/2017   Your procedure is scheduled on: 02-26-17   Report to [name] Main  Entrance Take [name] Elevators to 3rd floor to [name] at 5:30 AM.   Call this number if you have problems the morning of surgery [phone]    Remember: ONLY 1 PERSON MAY GO WITH YOU TO SHORT STAY TO GET  READY MORNING OF Sheila Jacobs.  Do not eat food or drink liquids :After Midnight.     Take these medicines the morning of surgery with A SIP OF WATER: Pantoprazole (Protonix), ALPRAZOLAM (XANAX ) IF NEEDED                               You may not have any metal on your body including hair pins and              piercings  Do not wear jewelry, make-up, lotions, powders or perfumes, deodorant             Do not wear nail polish.  Do not shave  48 hours prior to surgery.              Do not bring valuables to the hospital. Sheila Jacobs.  Contacts, dentures or bridgework may not be worn into surgery.  Leave suitcase in the car. After surgery it may be brought to your room.                 Please read over the following fact sheets you were given: _____________________________________________________________________             Sheila Jacobs - Preparing for Surgery Before surgery, you can play an important role.  Because skin is not sterile, your skin needs to be as free of germs as possible.  You can reduce the number of germs on your skin by washing with CHG (chlorahexidine gluconate) soap before surgery.  CHG is an antiseptic cleaner which kills germs and bonds with the skin to continue killing germs even after washing. Please DO NOT use if you have an allergy to CHG or antibacterial soaps.  If your skin becomes reddened/irritated stop using the CHG and inform your nurse when you arrive at Short Stay. Do not shave (including legs and underarms) for at least 48 hours prior to the first CHG shower.  You may  shave your face/neck. Please follow these instructions carefully:  1.  Shower with CHG Soap the night before surgery and the  morning of Surgery.  2.  If you choose to wash your hair, wash your hair first as usual with your  normal  shampoo.  3.  After you shampoo, rinse your hair and body thoroughly to remove the  shampoo.                           4.  Use CHG as you would any other liquid soap.  You can apply chg directly  to the skin and wash                       Gently with a scrungie or clean washcloth.  5.  Apply the CHG Soap to your body  ONLY FROM THE NECK DOWN.   Do not use on face/ open                           Wound or open sores. Avoid contact with eyes, ears mouth and genitals (private parts).                       Wash face,  Genitals (private parts) with your normal soap.             6.  Wash thoroughly, paying special attention to the area where your surgery  will be performed.  7.  Thoroughly rinse your body with warm water from the neck down.  8.  DO NOT shower/wash with your normal soap after using and rinsing off  the CHG Soap.                9.  Pat yourself dry with a clean towel.            10.  Wear clean pajamas.            11.  Place clean sheets on your bed the night of your first shower and do not  sleep with pets. Day of Surgery : Do not apply any lotions/deodorants the morning of surgery.  Please wear clean clothes to the hospital/surgery Jacobs.  FAILURE TO FOLLOW THESE INSTRUCTIONS MAY RESULT IN THE CANCELLATION OF YOUR SURGERY PATIENT SIGNATURE_________________________________  NURSE SIGNATURE__________________________________  ________________________________________________________________________

## 2017-02-22 NOTE — Progress Notes (Signed)
10-16-16 (EPIC) EKG 12-05-16  (EPIC) Office note indicating no additional cardiac f/u was needed. 06-22-16 (EPIC) CXR

## 2017-02-25 ENCOUNTER — Ambulatory Visit (HOSPITAL_COMMUNITY)
Admission: RE | Admit: 2017-02-25 | Discharge: 2017-02-25 | Disposition: A | Discharge status: Home / Self Care | Payer: Medicare Other | Source: Ambulatory Visit | Attending: Anesthesiology | Admitting: Anesthesiology

## 2017-02-25 ENCOUNTER — Encounter (HOSPITAL_COMMUNITY): Payer: Self-pay

## 2017-02-25 ENCOUNTER — Encounter (HOSPITAL_COMMUNITY)
Admission: RE | Admit: 2017-02-25 | Discharge: 2017-02-25 | Disposition: A | Discharge status: Home / Self Care | Payer: Medicare Other | Source: Ambulatory Visit | Attending: General Surgery | Admitting: General Surgery

## 2017-02-25 DIAGNOSIS — I10 Essential (primary) hypertension: Secondary | ICD-10-CM | POA: Insufficient documentation

## 2017-02-25 DIAGNOSIS — E876 Hypokalemia: Secondary | ICD-10-CM | POA: Diagnosis not present

## 2017-02-25 DIAGNOSIS — C50412 Malignant neoplasm of upper-outer quadrant of left female breast: Secondary | ICD-10-CM | POA: Diagnosis not present

## 2017-02-25 DIAGNOSIS — Z01818 Encounter for other preprocedural examination: Secondary | ICD-10-CM

## 2017-02-25 DIAGNOSIS — R609 Edema, unspecified: Secondary | ICD-10-CM | POA: Insufficient documentation

## 2017-02-25 DIAGNOSIS — E785 Hyperlipidemia, unspecified: Secondary | ICD-10-CM | POA: Insufficient documentation

## 2017-02-25 DIAGNOSIS — Z87891 Personal history of nicotine dependence: Secondary | ICD-10-CM | POA: Diagnosis not present

## 2017-02-25 DIAGNOSIS — M545 Low back pain: Secondary | ICD-10-CM | POA: Insufficient documentation

## 2017-02-25 DIAGNOSIS — Z01810 Encounter for preprocedural cardiovascular examination: Secondary | ICD-10-CM | POA: Insufficient documentation

## 2017-02-25 DIAGNOSIS — K219 Gastro-esophageal reflux disease without esophagitis: Secondary | ICD-10-CM | POA: Diagnosis not present

## 2017-02-25 DIAGNOSIS — R269 Unspecified abnormalities of gait and mobility: Secondary | ICD-10-CM | POA: Insufficient documentation

## 2017-02-25 LAB — CBC
HCT: 35.3 % — ABNORMAL LOW (ref 36.0–46.0)
HEMOGLOBIN: 11.9 g/dL — AB (ref 12.0–15.0)
MCH: 31.5 pg (ref 26.0–34.0)
MCHC: 33.7 g/dL (ref 30.0–36.0)
MCV: 93.4 fL (ref 78.0–100.0)
PLATELETS: 228 10*3/uL (ref 150–400)
RBC: 3.78 MIL/uL — ABNORMAL LOW (ref 3.87–5.11)
RDW: 12.3 % (ref 11.5–15.5)
WBC: 4.7 10*3/uL (ref 4.0–10.5)

## 2017-02-25 LAB — BASIC METABOLIC PANEL
ANION GAP: 5 (ref 5–15)
BUN: 18 mg/dL (ref 6–20)
CALCIUM: 9.7 mg/dL (ref 8.9–10.3)
CO2: 30 mmol/L (ref 22–32)
CREATININE: 0.75 mg/dL (ref 0.44–1.00)
Chloride: 103 mmol/L (ref 101–111)
Glucose, Bld: 105 mg/dL — ABNORMAL HIGH (ref 65–99)
Potassium: 4.4 mmol/L (ref 3.5–5.1)
SODIUM: 138 mmol/L (ref 135–145)

## 2017-02-25 IMAGING — CR DG CHEST 2V
2 series · 2 of 2 positions shown · non-contrast
Comparison: [DATE]

CLINICAL DATA: Preoperative evaluation for upcoming thyroid surgery

EXAM:
CHEST  2 VIEW

[w chest pa]
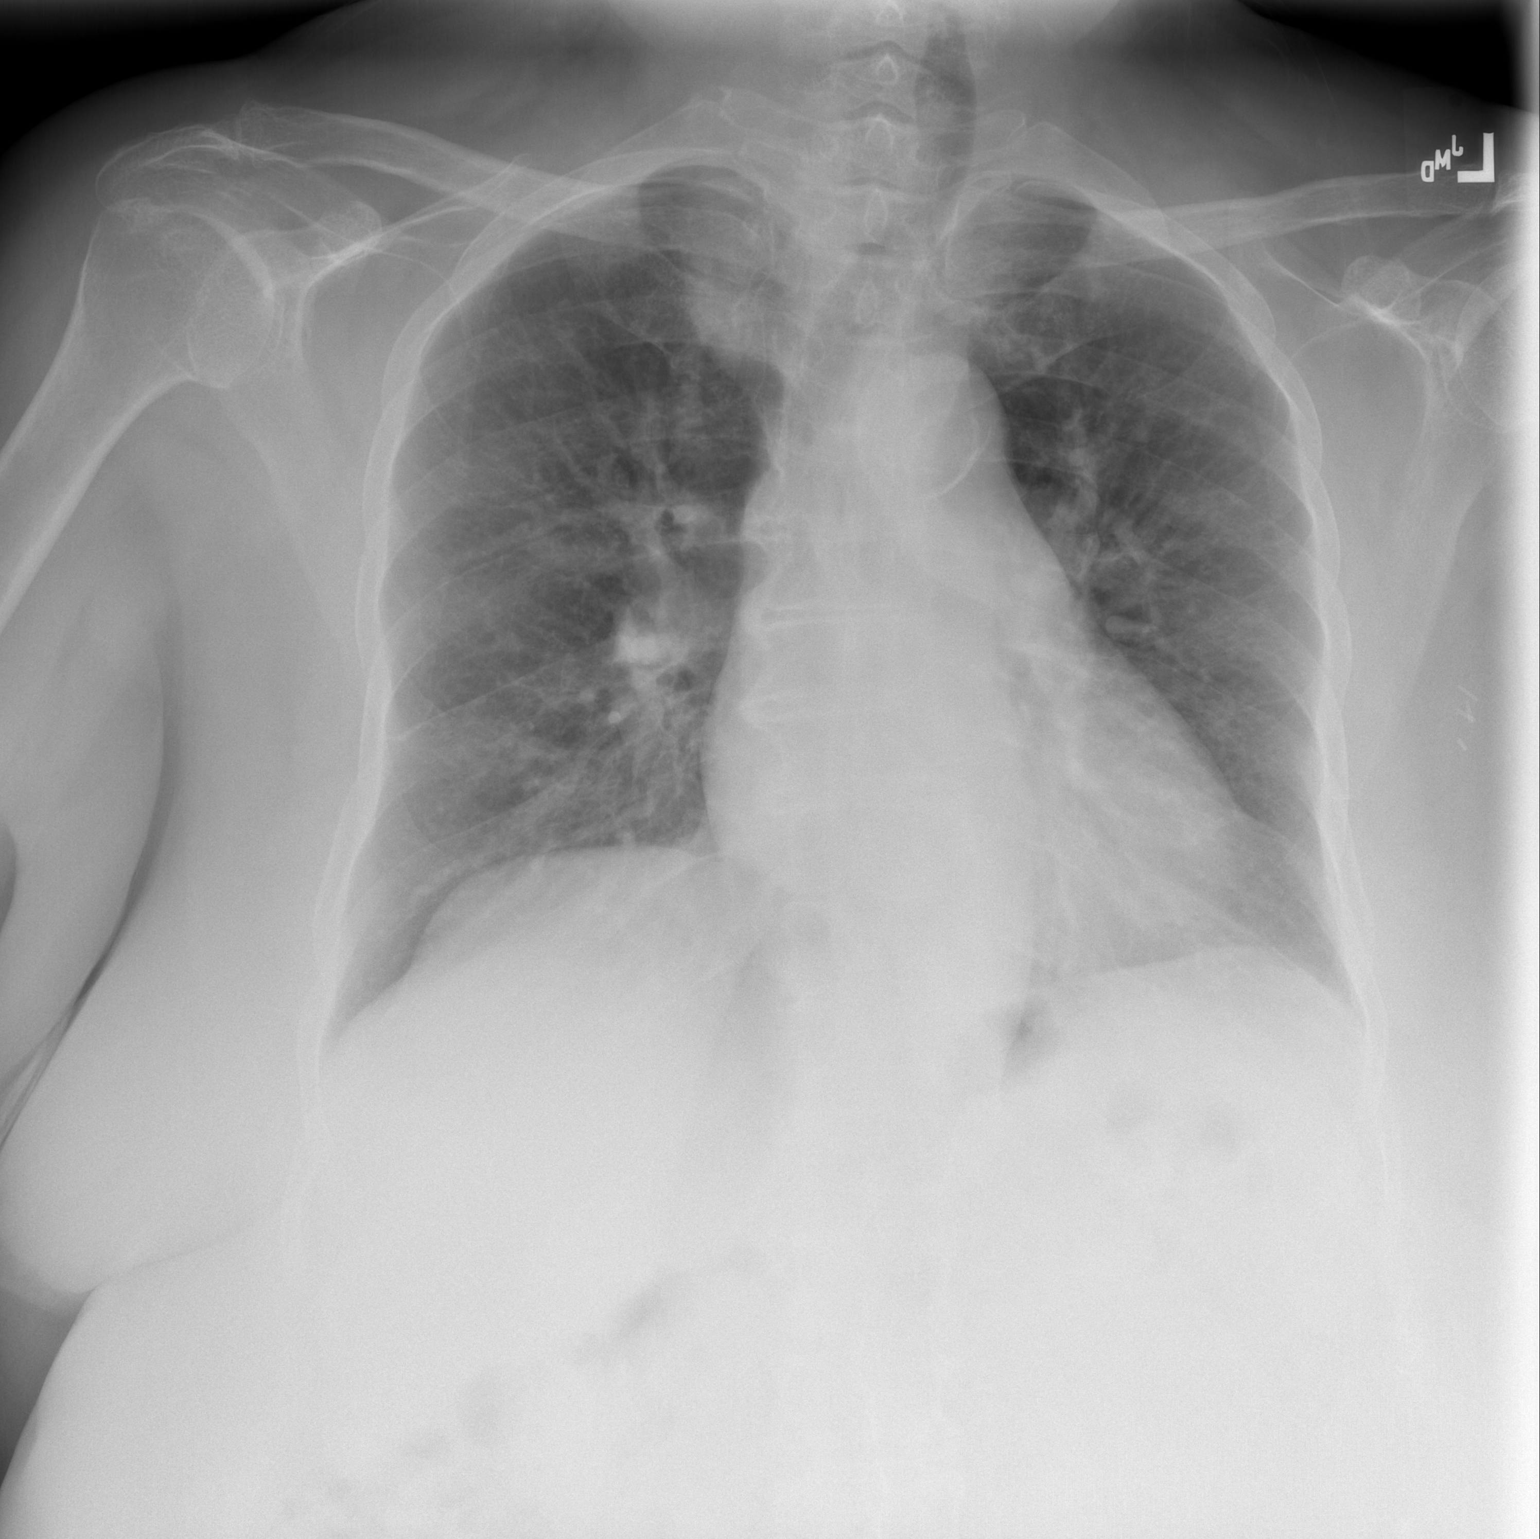

[w chest lat]
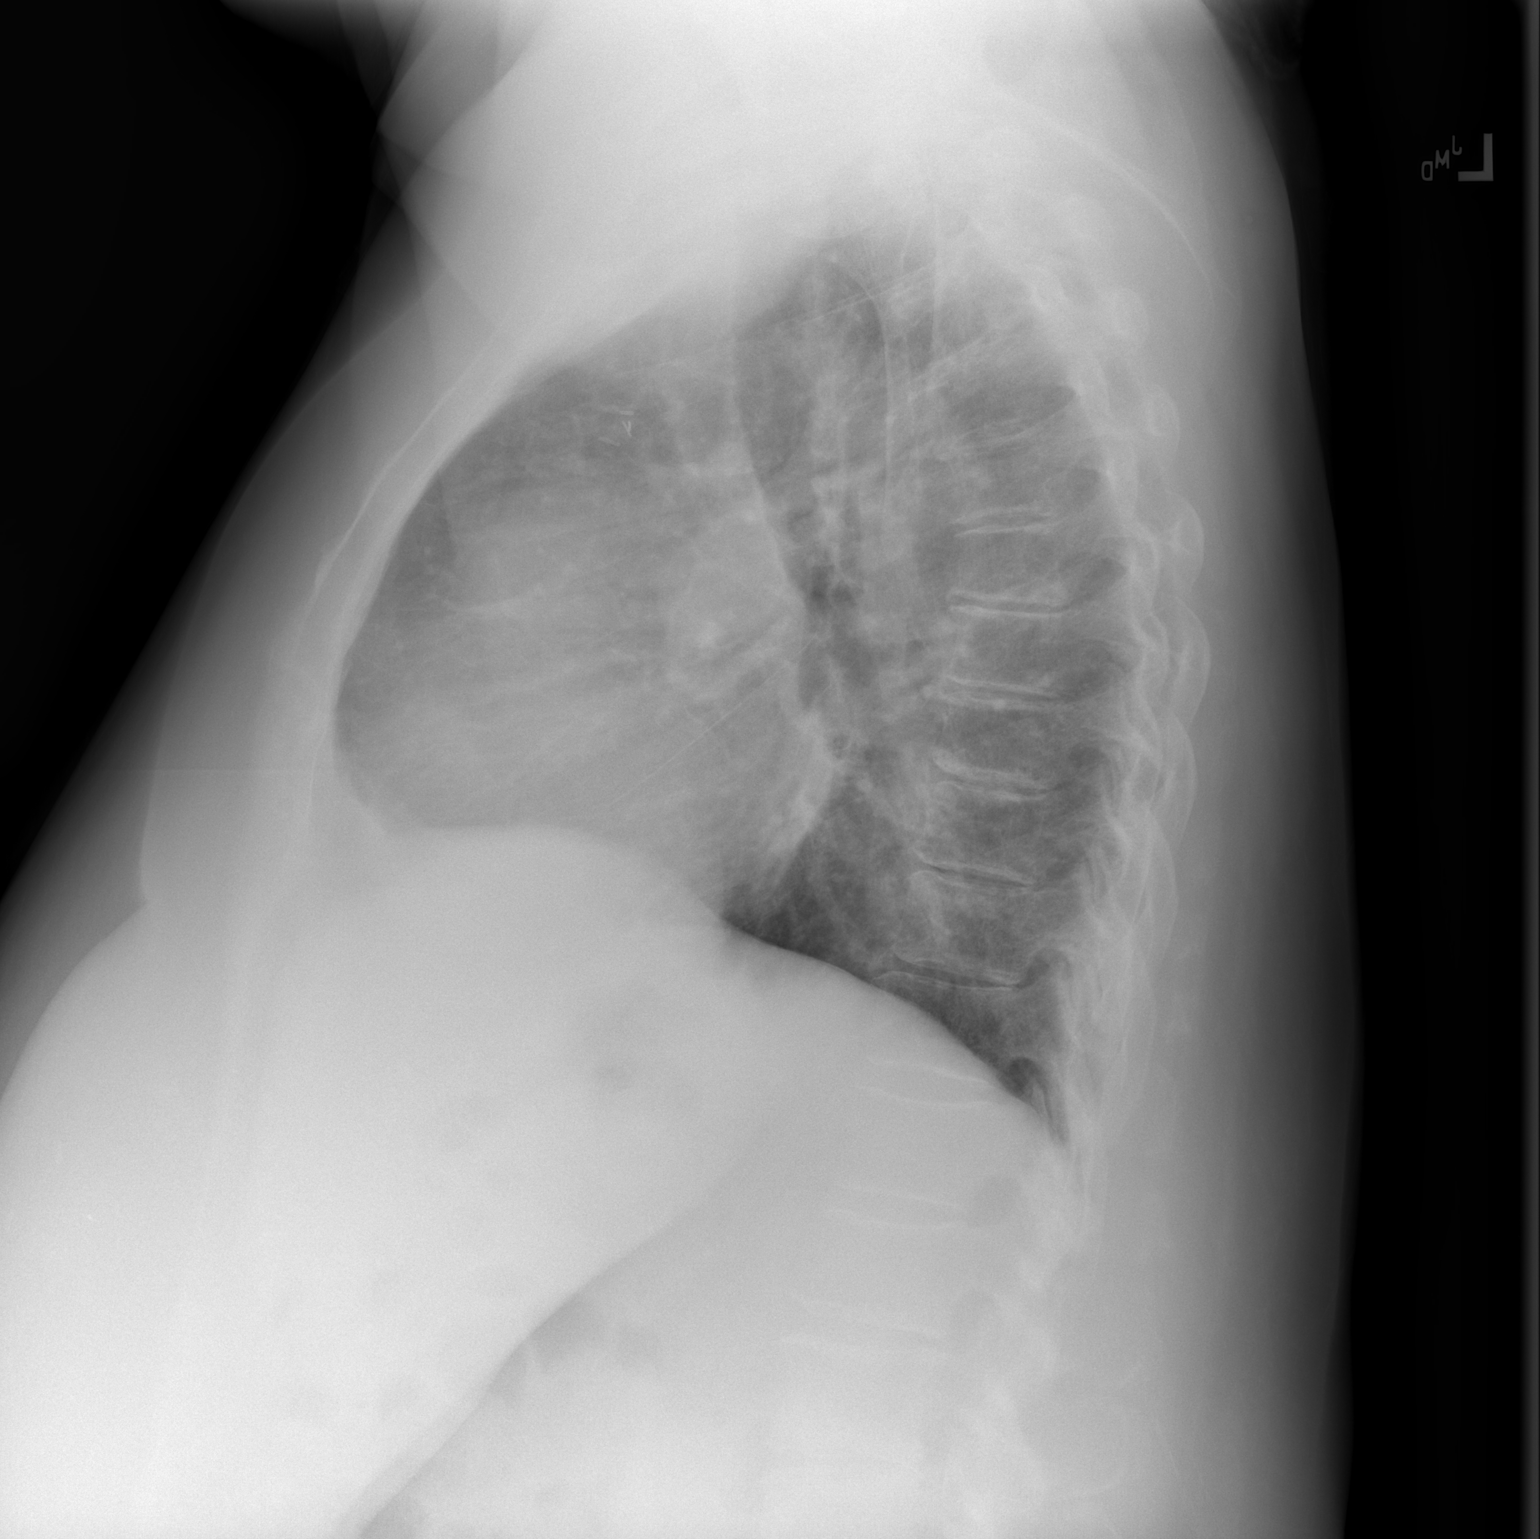

[2 of 2 positions shown; findings below may reference images not displayed]

FINDINGS: Cardiac shadow is at the upper limits normal but stable. Stable
aortic calcifications are seen. Lungs are well aerated bilaterally
without focal infiltrate or sizable effusion. No acute bony
abnormality is seen.
IMPRESSION: No active cardiopulmonary disease.

## 2017-02-25 MED ORDER — DEXTROSE 5 % IV SOLN
3.0000 g | INTRAVENOUS | Status: AC
  Administered 2017-02-26: 3 g via INTRAVENOUS
  Filled 2017-02-25: qty 3000
  Filled 2017-02-25: qty 3

## 2017-02-25 NOTE — H&P (Signed)
History of Present Illness Marland Kitchen T. Kenyona Rena MD; 02/07/2017 9:37 AM) The patient is a 66 year old female who presents with a complaint of Enlarged thyroid. Patient is referred by Dr. Ardeth Perfect for a symptomatically going to her. She is well known to me from a recent diagnosis and treatment for breast cancer. She has had a known goiter for some time. We have noted that in the office previously. More recently she began to complain of some choking spells. This can happen at any time and not related to eating. She feels some mild pressure Symptoms. Recent CT scan was performed and she was referred.   Problem List/Past Medical Marland Kitchen T. Clay Solum, MD; 02/07/2017 9:41 AM) STAGE I BREAST CANCER, LEFT (C50.912)  GOITER DIFFUSE, NONTOXIC (E04.0)   Past Surgical History Marland Kitchen T. Jsaon Yoo, MD; 02/07/2017 9:41 AM) Breast Biopsy  Left.  Diagnostic Studies History Marland Kitchen T. Lindaann Gradilla, MD; 02/07/2017 9:41 AM) Colonoscopy  1-5 years ago Mammogram  within last year Pap Smear  1-5 years ago [place] ([address])  Within Normal Limits.  Allergies Malachy Moan, [place]; 02/07/2017 8:41 AM) Cortisone *CORTICOSTEROIDS*   Medication History Malachy Moan, RMA; 02/07/2017 8:41 AM) Pantoprazole Sodium (40MG  Tablet DR, Oral) Active. ALPRAZolam (0.25MG  Tablet, Oral) Active. HydroCHLOROthiazide (25MG  Tablet, Oral) Active. Klor-Con M20 [facility] Tablet ER, Oral) Active. Omeprazole (20MG  Capsule DR, Oral) Active. Acetaminophen (500MG  Tablet, Oral) Active. Aspirin (81MG  Tablet DR, Oral) Active. Calcium Citrate + D (250-200MG -UNIT Tablet, Oral) Active. Medications Reconciled  Social History Marland Kitchen T. Hubert Raatz, MD; 02/07/2017 9:41 AM) Caffeine use  Coffee, Tea. No alcohol use  No drug use  Tobacco use  Former smoker.  Family History Marland Kitchen T. Alzena Gerber, MD; 02/07/2017 9:41 AM) Hypertension  Daughter.  Pregnancy / Birth History  Marland Kitchen T. Ephrata Verville, MD; 02/07/2017 9:41 AM) Age at menarche  44 years. Age of menopause  50-50 Gravida  1 Maternal age  1-25 Para  1 Regular periods   Other Problems Marland Kitchen T. Liesa Tsan, MD; 02/07/2017 9:41 AM) Anxiety Disorder  High blood pressure  Migraine Headache   Vitals Malachy Moan RMA; 02/07/2017 8:42 AM) 02/07/2017 8:41 AM Weight: 296.4 lb Height: 64in Body Surface Area: 2.31 m Body Mass Index: 50.88 kg/m  Temp.: 98.63F  Pulse: 89 (Regular)  BP: 150/70 (Sitting, Left Arm, Standard)       Physical Exam Marland Kitchen T. Edmon Magid MD; 02/07/2017 9:38 AM) The physical exam findings are as follows: Note:General: Alert, obese, in no distress Skin: Warm and dry without rash or infection. HEENT: Large visible and palpable soft goiter mostly on the right. No discrete masses. Sclera nonicteric. Pupils equal round and reactive. Oropharynx clear. Lymph nodes: No cervical, supraclavicular, nodes palpable. Lungs: Breath sounds clear and equal. No wheezing or increased work of breathing. Cardiovascular: Regular rate and rhythm without murmer. No JVD or edema. Extremities: No edema or joint swelling or deformity. No chronic venous stasis changes. Neurologic: Alert and fully oriented. Gait normal. No focal weakness. Psychiatric: Normal mood and affect. Thought content appropriate with normal judgement and insight    Assessment & Plan Marland Kitchen T. Kellie Chisolm MD; 02/07/2017 9:42 AM) GOITER DIFFUSE, NONTOXIC (E04.0) Impression: She has a very large goiter mostly on the right side. No suspicious nodules. This is symptomatic. CT scan is reviewed which shows a 9 x 7 cm x 6 cm right thyroid lobe displacement of the neck structures to the left. I discussed this with the patient. I think it is likely causing her current Symptoms. Conceivably this could progress to  airway compromise. I think total thyroidectomy would be her best option. I discussed surgery in detail  including its indications and nature and expected recovery. We discussed risks of anesthetic complications, bleeding, infection, recurrent laryngeal nerve injury with permanent hoarseness and injury to the parathyroid glands with permanent hypocalcemia. She was given literature regarding the surgery. After discussion she would like to proceed. We will work on scheduling. Current Plans Total thyroidectomy under general anesthesia with overnight hospitalization

## 2017-02-26 ENCOUNTER — Encounter (HOSPITAL_COMMUNITY): Payer: Self-pay | Admitting: *Deleted

## 2017-02-26 ENCOUNTER — Observation Stay (HOSPITAL_COMMUNITY)
Admission: RE | Admit: 2017-02-26 | Discharge: 2017-02-27 | Disposition: A | Discharge status: Home / Self Care | Payer: Medicare Other | Source: Ambulatory Visit | Attending: General Surgery | Admitting: General Surgery

## 2017-02-26 ENCOUNTER — Ambulatory Visit (HOSPITAL_COMMUNITY): Payer: Medicare Other | Admitting: Registered Nurse

## 2017-02-26 ENCOUNTER — Encounter (HOSPITAL_COMMUNITY)
Admission: RE | Disposition: A | Discharge status: Home / Self Care | Payer: Self-pay | Source: Ambulatory Visit | Attending: General Surgery

## 2017-02-26 DIAGNOSIS — J449 Chronic obstructive pulmonary disease, unspecified: Secondary | ICD-10-CM | POA: Diagnosis not present

## 2017-02-26 DIAGNOSIS — I10 Essential (primary) hypertension: Secondary | ICD-10-CM | POA: Insufficient documentation

## 2017-02-26 DIAGNOSIS — E890 Postprocedural hypothyroidism: Secondary | ICD-10-CM | POA: Insufficient documentation

## 2017-02-26 DIAGNOSIS — Z79899 Other long term (current) drug therapy: Secondary | ICD-10-CM | POA: Diagnosis not present

## 2017-02-26 DIAGNOSIS — E041 Nontoxic single thyroid nodule: Secondary | ICD-10-CM | POA: Diagnosis not present

## 2017-02-26 DIAGNOSIS — Z87891 Personal history of nicotine dependence: Secondary | ICD-10-CM | POA: Insufficient documentation

## 2017-02-26 DIAGNOSIS — F419 Anxiety disorder, unspecified: Secondary | ICD-10-CM | POA: Diagnosis not present

## 2017-02-26 DIAGNOSIS — G43909 Migraine, unspecified, not intractable, without status migrainosus: Secondary | ICD-10-CM | POA: Insufficient documentation

## 2017-02-26 DIAGNOSIS — E042 Nontoxic multinodular goiter: Secondary | ICD-10-CM | POA: Diagnosis not present

## 2017-02-26 DIAGNOSIS — Z6841 Body Mass Index (BMI) 40.0 and over, adult: Secondary | ICD-10-CM | POA: Insufficient documentation

## 2017-02-26 DIAGNOSIS — Z7982 Long term (current) use of aspirin: Secondary | ICD-10-CM | POA: Insufficient documentation

## 2017-02-26 DIAGNOSIS — E049 Nontoxic goiter, unspecified: Secondary | ICD-10-CM | POA: Diagnosis not present

## 2017-02-26 DIAGNOSIS — E785 Hyperlipidemia, unspecified: Secondary | ICD-10-CM | POA: Diagnosis not present

## 2017-02-26 HISTORY — PX: THYROIDECTOMY: SHX17

## 2017-02-26 HISTORY — DX: Angina pectoris, unspecified: I20.9

## 2017-02-26 SURGERY — THYROIDECTOMY
Anesthesia: General | Site: Neck

## 2017-02-26 MED ORDER — LACTATED RINGERS IV SOLN
INTRAVENOUS | Status: DC | PRN
  Administered 2017-02-26: 07:00:00 via INTRAVENOUS

## 2017-02-26 MED ORDER — FENTANYL CITRATE (PF) 100 MCG/2ML IJ SOLN
INTRAMUSCULAR | Status: DC | PRN
  Administered 2017-02-26 (×4): 50 ug via INTRAVENOUS

## 2017-02-26 MED ORDER — GABAPENTIN 300 MG PO CAPS
300.0000 mg | ORAL_CAPSULE | ORAL | Status: AC
  Administered 2017-02-26: 300 mg via ORAL
  Filled 2017-02-26: qty 1

## 2017-02-26 MED ORDER — ONDANSETRON HCL 4 MG/2ML IJ SOLN
INTRAMUSCULAR | Status: DC | PRN
  Administered 2017-02-26: 4 mg via INTRAVENOUS

## 2017-02-26 MED ORDER — PANTOPRAZOLE SODIUM 40 MG PO TBEC
40.0000 mg | DELAYED_RELEASE_TABLET | Freq: Every day | ORAL | Status: DC
  Administered 2017-02-27: 40 mg via ORAL
  Filled 2017-02-26: qty 1

## 2017-02-26 MED ORDER — PROPOFOL 10 MG/ML IV BOLUS
INTRAVENOUS | Status: DC | PRN
  Administered 2017-02-26: 250 mg via INTRAVENOUS

## 2017-02-26 MED ORDER — PROPOFOL 10 MG/ML IV BOLUS
INTRAVENOUS | Status: AC
  Filled 2017-02-26: qty 20

## 2017-02-26 MED ORDER — ROCURONIUM BROMIDE 10 MG/ML (PF) SYRINGE
PREFILLED_SYRINGE | INTRAVENOUS | Status: DC | PRN
  Administered 2017-02-26 (×2): 10 mg via INTRAVENOUS
  Administered 2017-02-26: 40 mg via INTRAVENOUS
  Administered 2017-02-26: 10 mg via INTRAVENOUS

## 2017-02-26 MED ORDER — SUCCINYLCHOLINE CHLORIDE 200 MG/10ML IV SOSY
PREFILLED_SYRINGE | INTRAVENOUS | Status: AC
  Filled 2017-02-26: qty 10

## 2017-02-26 MED ORDER — PROPOFOL 10 MG/ML IV BOLUS
INTRAVENOUS | Status: AC
Start: 2017-02-26 — End: 2017-02-26
  Filled 2017-02-26: qty 20

## 2017-02-26 MED ORDER — LIDOCAINE 2% (20 MG/ML) 5 ML SYRINGE
INTRAMUSCULAR | Status: DC | PRN
  Administered 2017-02-26: 100 mg via INTRAVENOUS

## 2017-02-26 MED ORDER — ONDANSETRON HCL 4 MG/2ML IJ SOLN
INTRAMUSCULAR | Status: AC
  Filled 2017-02-26: qty 2

## 2017-02-26 MED ORDER — METOCLOPRAMIDE HCL 5 MG/ML IJ SOLN: 10.0000 mg | Freq: Once | INTRAMUSCULAR | Status: DC | PRN

## 2017-02-26 MED ORDER — ONDANSETRON 4 MG PO TBDP: 4.0000 mg | ORAL_TABLET | Freq: Four times a day (QID) | ORAL | Status: DC | PRN

## 2017-02-26 MED ORDER — LEVOTHYROXINE SODIUM 100 MCG PO TABS
100.0000 ug | ORAL_TABLET | Freq: Every day | ORAL | Status: DC
  Administered 2017-02-27: 100 ug via ORAL
  Filled 2017-02-26: qty 1

## 2017-02-26 MED ORDER — BUPIVACAINE-EPINEPHRINE (PF) 0.25% -1:200000 IJ SOLN
INTRAMUSCULAR | Status: AC
  Filled 2017-02-26: qty 30

## 2017-02-26 MED ORDER — OXYCODONE-ACETAMINOPHEN 5-325 MG PO TABS
1.0000 | ORAL_TABLET | ORAL | Status: DC | PRN
  Administered 2017-02-27 (×2): 1 via ORAL
  Filled 2017-02-26: qty 2
  Filled 2017-02-26 (×2): qty 1

## 2017-02-26 MED ORDER — HYDROCHLOROTHIAZIDE 12.5 MG PO CAPS
12.5000 mg | ORAL_CAPSULE | Freq: Every day | ORAL | Status: DC
  Administered 2017-02-26 – 2017-02-27 (×2): 12.5 mg via ORAL
  Filled 2017-02-26 (×2): qty 1

## 2017-02-26 MED ORDER — ROCURONIUM BROMIDE 50 MG/5ML IV SOSY
PREFILLED_SYRINGE | INTRAVENOUS | Status: AC
  Filled 2017-02-26: qty 5

## 2017-02-26 MED ORDER — MIDAZOLAM HCL 2 MG/2ML IJ SOLN
INTRAMUSCULAR | Status: AC
  Filled 2017-02-26: qty 2

## 2017-02-26 MED ORDER — CHLORHEXIDINE GLUCONATE CLOTH 2 % EX PADS: 6.0000 | MEDICATED_PAD | Freq: Once | CUTANEOUS | Status: DC

## 2017-02-26 MED ORDER — POTASSIUM CHLORIDE CRYS ER 20 MEQ PO TBCR: 20.0000 meq | EXTENDED_RELEASE_TABLET | ORAL | Status: DC

## 2017-02-26 MED ORDER — SUCCINYLCHOLINE CHLORIDE 200 MG/10ML IV SOSY
PREFILLED_SYRINGE | INTRAVENOUS | Status: DC | PRN
  Administered 2017-02-26: 130 mg via INTRAVENOUS

## 2017-02-26 MED ORDER — 0.9 % SODIUM CHLORIDE (POUR BTL) OPTIME
TOPICAL | Status: DC | PRN
  Administered 2017-02-26: 1000 mL

## 2017-02-26 MED ORDER — CELECOXIB 200 MG PO CAPS
400.0000 mg | ORAL_CAPSULE | ORAL | Status: AC
  Administered 2017-02-26: 400 mg via ORAL
  Filled 2017-02-26: qty 2

## 2017-02-26 MED ORDER — LIDOCAINE 2% (20 MG/ML) 5 ML SYRINGE
INTRAMUSCULAR | Status: AC
  Filled 2017-02-26: qty 5

## 2017-02-26 MED ORDER — ALPRAZOLAM 0.25 MG PO TABS: 0.2500 mg | ORAL_TABLET | Freq: Two times a day (BID) | ORAL | Status: DC | PRN

## 2017-02-26 MED ORDER — SUGAMMADEX SODIUM 200 MG/2ML IV SOLN
INTRAVENOUS | Status: DC | PRN
  Administered 2017-02-26: 300 mg via INTRAVENOUS

## 2017-02-26 MED ORDER — MEPERIDINE HCL 50 MG/ML IJ SOLN: 6.2500 mg | INTRAMUSCULAR | Status: DC | PRN

## 2017-02-26 MED ORDER — CALCIUM CARBONATE ANTACID 500 MG PO CHEW
2.0000 | CHEWABLE_TABLET | Freq: Three times a day (TID) | ORAL | Status: DC
  Administered 2017-02-26 – 2017-02-27 (×4): 400 mg via ORAL
  Filled 2017-02-26 (×4): qty 2

## 2017-02-26 MED ORDER — FENTANYL CITRATE (PF) 250 MCG/5ML IJ SOLN
INTRAMUSCULAR | Status: AC
  Filled 2017-02-26: qty 5

## 2017-02-26 MED ORDER — MIDAZOLAM HCL 5 MG/5ML IJ SOLN
INTRAMUSCULAR | Status: DC | PRN
  Administered 2017-02-26: 1 mg via INTRAVENOUS

## 2017-02-26 MED ORDER — HYDROMORPHONE HCL 1 MG/ML IJ SOLN: 0.5000 mg | INTRAMUSCULAR | Status: DC | PRN

## 2017-02-26 MED ORDER — POTASSIUM CHLORIDE IN NACL 20-0.45 MEQ/L-% IV SOLN
INTRAVENOUS | Status: DC
  Administered 2017-02-26: 50 mL/h via INTRAVENOUS
  Filled 2017-02-26 (×2): qty 1000

## 2017-02-26 MED ORDER — ACETAMINOPHEN 500 MG PO TABS
1000.0000 mg | ORAL_TABLET | ORAL | Status: AC
  Administered 2017-02-26: 1000 mg via ORAL
  Filled 2017-02-26: qty 2

## 2017-02-26 MED ORDER — ENOXAPARIN SODIUM 40 MG/0.4ML ~~LOC~~ SOLN
40.0000 mg | SUBCUTANEOUS | Status: DC
  Administered 2017-02-27: 40 mg via SUBCUTANEOUS
  Filled 2017-02-26: qty 0.4

## 2017-02-26 MED ORDER — FENTANYL CITRATE (PF) 100 MCG/2ML IJ SOLN
INTRAMUSCULAR | Status: AC
  Administered 2017-02-26: 50 ug via INTRAVENOUS
  Filled 2017-02-26: qty 2

## 2017-02-26 MED ORDER — ONDANSETRON HCL 4 MG/2ML IJ SOLN: 4.0000 mg | Freq: Four times a day (QID) | INTRAMUSCULAR | Status: DC | PRN

## 2017-02-26 MED ORDER — FENTANYL CITRATE (PF) 100 MCG/2ML IJ SOLN
25.0000 ug | INTRAMUSCULAR | Status: DC | PRN
  Administered 2017-02-26: 50 ug via INTRAVENOUS

## 2017-02-26 SURGICAL SUPPLY — 38 items
APL SKNCLS STERI-STRIP NONHPOA (GAUZE/BANDAGES/DRESSINGS) ×1
ATTRACTOMAT 16X20 MAGNETIC DRP (DRAPES) ×3 IMPLANT
BENZOIN TINCTURE PRP APPL 2/3 (GAUZE/BANDAGES/DRESSINGS) ×3 IMPLANT
BLADE HEX COATED 2.75 (ELECTRODE) ×1 IMPLANT
BLADE SURG 15 STRL LF DISP TIS (BLADE) ×1 IMPLANT
BLADE SURG 15 STRL SS (BLADE) ×3
BLADE SURG SZ10 CARB STEEL (BLADE) ×3 IMPLANT
CLIP TI MEDIUM 6 (CLIP) ×8 IMPLANT
CLIP TI WIDE RED SMALL 6 (CLIP) ×8 IMPLANT
COVER SURGICAL LIGHT HANDLE (MISCELLANEOUS) ×3 IMPLANT
DISSECTOR ROUND CHERRY 3/8 STR (MISCELLANEOUS) ×3 IMPLANT
DRAPE LAPAROTOMY T 98X78 PEDS (DRAPES) ×3 IMPLANT
ELECT COATED BLADE 2.86 ST (ELECTRODE) ×1 IMPLANT
ELECT PENCIL ROCKER SW 15FT (MISCELLANEOUS) ×3 IMPLANT
ELECT REM PT RETURN 15FT ADLT (MISCELLANEOUS) ×3 IMPLANT
GAUZE SPONGE 4X4 12PLY STRL (GAUZE/BANDAGES/DRESSINGS) ×3 IMPLANT
GAUZE SPONGE 4X4 16PLY XRAY LF (GAUZE/BANDAGES/DRESSINGS) ×3 IMPLANT
GOWN STRL REUS W/TWL LRG LVL3 (GOWN DISPOSABLE) ×3 IMPLANT
GOWN STRL REUS W/TWL XL LVL3 (GOWN DISPOSABLE) ×6 IMPLANT
HEMOSTAT SURGICEL 2X14 (HEMOSTASIS) IMPLANT
HEMOSTAT SURGICEL 2X4 FIBR (HEMOSTASIS) ×2 IMPLANT
KIT BASIN OR (CUSTOM PROCEDURE TRAY) ×3 IMPLANT
LIGHT WAVEGUIDE WIDE FLAT (MISCELLANEOUS) ×2 IMPLANT
MARKER SKIN DUAL TIP RULER LAB (MISCELLANEOUS) ×3 IMPLANT
NS IRRIG 1000ML POUR BTL (IV SOLUTION) ×3 IMPLANT
PACK BASIC VI WITH GOWN DISP (CUSTOM PROCEDURE TRAY) ×3 IMPLANT
SHEARS HARMONIC 9CM CVD (BLADE) ×2 IMPLANT
STAPLER VISISTAT 35W (STAPLE) ×1 IMPLANT
SUT SILK 2 0 (SUTURE) ×3
SUT SILK 2-0 18XBRD TIE 12 (SUTURE) ×1 IMPLANT
SUT SILK 3 0 (SUTURE)
SUT SILK 3-0 18XBRD TIE 12 (SUTURE) IMPLANT
SUT VIC AB 3-0 SH 18 (SUTURE) ×5 IMPLANT
SUT VICRYL 2 0 18  UND BR (SUTURE)
SUT VICRYL 2 0 18 UND BR (SUTURE) IMPLANT
SYR BULB IRRIGATION 50ML (SYRINGE) ×3 IMPLANT
TOWEL OR 17X26 10 PK STRL BLUE (TOWEL DISPOSABLE) ×3 IMPLANT
YANKAUER SUCT BULB TIP 10FT TU (MISCELLANEOUS) ×3 IMPLANT

## 2017-02-26 NOTE — Anesthesia Preprocedure Evaluation (Addendum)
Anesthesia Evaluation  Patient identified by MRN, date of birth, ID band Patient awake    Reviewed: Allergy & Precautions, NPO status , Patient's Chart, lab work & pertinent test results  Airway Mallampati: II  TM Distance: >3 FB Neck ROM: Full    Dental no notable dental hx. (+) Edentulous Upper, Edentulous Lower   Pulmonary COPD, former smoker,    Pulmonary exam normal breath sounds clear to auscultation       Cardiovascular hypertension, Pt. on medications Normal cardiovascular exam Rhythm:Regular Rate:Normal     Neuro/Psych negative neurological ROS  negative psych ROS   GI/Hepatic negative GI ROS, Neg liver ROS,   Endo/Other  Morbid obesity  Renal/GU negative Renal ROS  negative genitourinary   Musculoskeletal negative musculoskeletal ROS (+)   Abdominal   Peds negative pediatric ROS (+)  Hematology negative hematology ROS (+)   Anesthesia Other Findings   Reproductive/Obstetrics negative OB ROS                             Anesthesia Physical Anesthesia Plan  ASA: III  Anesthesia Plan: General   Post-op Pain Management:    Induction: Intravenous  PONV Risk Score and Plan: 3 and Ondansetron, Dexamethasone, Propofol and Midazolam  Airway Management Planned: Oral ETT  Additional Equipment:   Intra-op Plan:   Post-operative Plan: Extubation in OR  Informed Consent: I have reviewed the patients History and Physical, chart, labs and discussed the procedure including the risks, benefits and alternatives for the proposed anesthesia with the patient or authorized representative who has indicated his/her understanding and acceptance.   Dental advisory given  Plan Discussed with: CRNA  Anesthesia Plan Comments:         Anesthesia Quick Evaluation

## 2017-02-26 NOTE — Anesthesia Postprocedure Evaluation (Signed)
Anesthesia Post Note  Patient: JUNIE AVILLA  Procedure(s) Performed: Procedure(s) (LRB): TOTAL THYROIDECTOMY (N/A)     Patient location during evaluation: PACU Anesthesia Type: General Level of consciousness: awake and alert Pain management: pain level controlled Vital Signs Assessment: post-procedure vital signs reviewed and stable Respiratory status: spontaneous breathing, nonlabored ventilation, respiratory function stable and patient connected to nasal cannula oxygen Cardiovascular status: blood pressure returned to baseline and stable Postop Assessment: no signs of nausea or vomiting Anesthetic complications: no    Last Vitals:  Vitals:   02/26/17 1132 02/26/17 1224  BP: (!) 157/82 132/67  Pulse: (!) 57 (!) 58  Resp: 18 18  Temp: 36.4 C 36.6 C    Last Pain:  Vitals:   02/26/17 1224  TempSrc: Oral  PainSc:                  Montez Hageman

## 2017-02-26 NOTE — Anesthesia Procedure Notes (Signed)
Procedure Name: Intubation Date/Time: 02/26/2017 7:42 AM Performed by: Carleene Cooper A Pre-anesthesia Checklist: Patient identified, Emergency Drugs available, Suction available and Patient being monitored Patient Re-evaluated:Patient Re-evaluated prior to inductionOxygen Delivery Method: Circle system utilized Preoxygenation: Pre-oxygenation with 100% oxygen Intubation Type: IV induction Ventilation: Mask ventilation without difficulty and Oral airway inserted - appropriate to patient size Laryngoscope Size: Glidescope and 4 Grade View: Grade II Tube type: Oral Tube size: 7.5 mm Number of attempts: 1 Airway Equipment and Method: Stylet and Oral airway Placement Confirmation: ETT inserted through vocal cords under direct vision,  positive ETCO2 and breath sounds checked- equal and bilateral Secured at: 22 cm Tube secured with: Tape Dental Injury: Teeth and Oropharynx as per pre-operative assessment  Comments: Elective glidescope 4 used due to goiter with tracheal deviation to the left.  Pt also has large tongue.  Intubation performed by Julianne Rice, SRNA.

## 2017-02-26 NOTE — Progress Notes (Signed)
Pt's dressing saturated upon assessment. Blood has extended beyond marking on dressing. Reported to charge, Alyse Low, and reinforced with 4/4s and tape. Paged on-call MD, Dr. Hassell Done.   Spoke with Dr. Hassell Done. He supported the reinforcement and states he will come take a look at the pt.

## 2017-02-26 NOTE — Transfer of Care (Signed)
Immediate Anesthesia Transfer of Care Note  Patient: Sheila Jacobs  Procedure(s) Performed: Procedure(s): TOTAL THYROIDECTOMY (N/A)  Patient Location: PACU  Anesthesia Type:General  Level of Consciousness: awake, alert , oriented and patient cooperative  Airway & Oxygen Therapy: Patient Spontanous Breathing and Patient connected to face mask oxygen  Post-op Assessment: Report given to RN, Post -op Vital signs reviewed and stable and Patient moving all extremities  Post vital signs: Reviewed and stable  Last Vitals:  Vitals:   02/26/17 0550 02/26/17 1000  BP: 139/73   Pulse: 96   Resp: 18   Temp: 36.8 C 36.4 C    Last Pain:  Vitals:   02/26/17 0550  TempSrc: Oral         Complications: No apparent anesthesia complications

## 2017-02-26 NOTE — Interval H&P Note (Signed)
History and Physical Interval Note:  02/26/2017 7:23 AM  [name]  has presented today for surgery, with the diagnosis of goiter  The various methods of treatment have been discussed with the patient and family. After consideration of risks, benefits and other options for treatment, the patient has consented to  Procedure(s): TOTAL THYROIDECTOMY (N/A) as a surgical intervention .  The patient's history has been reviewed, patient examined, no change in status, stable for surgery.  I have reviewed the patient's chart and labs.  Questions were answered to the patient's satisfaction.     Sheila Jacobs

## 2017-02-26 NOTE — Op Note (Signed)
Preoperative Diagnosis: goiter  Postoprative Diagnosis: goiter  Procedure: Procedure(s): TOTAL THYROIDECTOMY   Surgeon: Excell Seltzer T   Assistants: Armandina Gemma  Anesthesia:  General endotracheal anesthesia  Indications: Patient is a 66 year old female with a long history of goiter noted recent onset of compression symptoms including choking spells. Examination has revealed a very large goiter mostly in the right lobe. CT scan shows a 9 cm right thyroid lobe without suspicious nodules and significant displacement of neck structures to the left. With these findings I discussed and recommended total thyroidectomy. The procedure and recovery and risks were discussed in detail documented elsewhere and she agreed to proceed.    Procedure Detail:  Patient was brought to the operating room, placed in the supine position on operating table and general endotracheal anesthesia induced. She was carefully positioned with her neck extended and padded and arms tucked and the entire neck and upper chest were widely sterilely prepped and draped. A timeout was performed and correct procedure verified. A curvilinear incision was made in the skin crease 2 fingerbreadths above the sternal notch and dissection carried down through the subcutaneous tissue and platysma using cautery. Subplatysmal flaps were then raised superiorly up to the thyroid cartilage, inferiorly down to the sternum and laterally out to the sternocleidomastoid muscles. The strap muscles were then divided in the midline with cautery dissection carried down onto the anterior thyroid. The right thyroid lobe was soft but massively enlarged. Careful blunt dissection was used to expose the anterior gland. Dissection was carried out laterally in the middle thyroid vein was controlled with a medium clip and Harmonic scalpel. Careful blunt dissection was used to continue to mobilize the gland. The superior pole vessels were able to be isolated and  individually controlled with double proximal clips and Harmonic scalpel. The inferior pole was mobilized with blunt and harmonic dissection and a couple of significant inferior veins were also clipped and controlled with Harmonic. With further careful blunt dissection the gland was then able to be mobilized up out of of the incision and exposed the posterior lateral gland. At this point we were able to identify the recurrent laryngeal nerve and the dissection continued being careful to stay anterior to the nerve. Also mobilizing the superior pole we did identify one definite parathyroid which was preserved. The gland was carefully mobilized posterior laterally staying on the capsule and individual branches of the thyroid artery were controlled with small clips and cautery until the gland was mobilized up onto the suspensory ligament of Gwenlyn Found which was further mobilized with cautery over to the isthmus. The nerve remained in view and protected. I divided the isthmus at this point with the Harmonic scalpel and the right lobe was sent for permanent pathology. We then proceeded to expose the left lobe identical fashion. It was minimally enlarged and slightly nodular laterally. The anterior surface was exposed with blunt dissection and middle thyroid vein divided with clips and Harmonic. Superior pole vessels were also dissected and divided identical to the other side. Inferior pole was mobilized the gland was mobilized up out of the neck and again were able to clearly visualize the recurrent laryngeal nerve and dissection continued anterior to this dividing small branches of the artery until we were up on the suspensory ligament and the remainder of the isthmus and the left lobe removed and sent for permanent section. The neck was irrigated and careful hemostasis obtained. Fibrillar was placed in the bed of the thyroid and over the trachea. The strap muscles  were then closed in the midline with interrupted 3-0 Vicryl.  The platysma was closed with interrupted 3-0 Vicryl and the skin closed with staples alternating with benzoin and Steri-Strips. Sponge needle and the counts were correct. Dry sterile dressing was applied.    Findings: Massive goiter right lobe as above  Estimated Blood Loss:  less than 50 mL         Drains: None  Blood Given: none          Specimens: Total thyroidectomy        Complications:  * No complications entered in OR log *         Disposition: PACU - hemodynamically stable.         Condition: stable

## 2017-02-27 ENCOUNTER — Encounter (HOSPITAL_COMMUNITY): Payer: Self-pay | Admitting: General Surgery

## 2017-02-27 ENCOUNTER — Other Ambulatory Visit: Payer: Self-pay | Admitting: Family Medicine

## 2017-02-27 DIAGNOSIS — G43909 Migraine, unspecified, not intractable, without status migrainosus: Secondary | ICD-10-CM | POA: Diagnosis not present

## 2017-02-27 DIAGNOSIS — Z87891 Personal history of nicotine dependence: Secondary | ICD-10-CM | POA: Diagnosis not present

## 2017-02-27 DIAGNOSIS — Z7982 Long term (current) use of aspirin: Secondary | ICD-10-CM | POA: Diagnosis not present

## 2017-02-27 DIAGNOSIS — Z79899 Other long term (current) drug therapy: Secondary | ICD-10-CM | POA: Diagnosis not present

## 2017-02-27 DIAGNOSIS — E049 Nontoxic goiter, unspecified: Secondary | ICD-10-CM | POA: Diagnosis not present

## 2017-02-27 DIAGNOSIS — E890 Postprocedural hypothyroidism: Secondary | ICD-10-CM | POA: Diagnosis not present

## 2017-02-27 DIAGNOSIS — I10 Essential (primary) hypertension: Secondary | ICD-10-CM | POA: Diagnosis not present

## 2017-02-27 LAB — CALCIUM, IONIZED
CALCIUM, IONIZED, SERUM: 5 mg/dL (ref 4.5–5.6)
CALCIUM, IONIZED, SERUM: 5.3 mg/dL (ref 4.5–5.6)

## 2017-02-27 MED ORDER — HYDROCODONE-ACETAMINOPHEN 5-325 MG PO TABS: 1.0000 | ORAL_TABLET | ORAL | 0 refills | Status: DC | PRN

## 2017-02-27 MED ORDER — CALCIUM CARBONATE ANTACID 500 MG PO CHEW
2.0000 | CHEWABLE_TABLET | Freq: Three times a day (TID) | ORAL | 1 refills | Status: DC
Start: 2017-02-27 — End: 2017-10-23

## 2017-02-27 MED ORDER — LEVOTHYROXINE SODIUM 100 MCG PO TABS: 100.0000 ug | ORAL_TABLET | Freq: Every day | ORAL | 2 refills | Status: DC

## 2017-02-27 NOTE — Progress Notes (Signed)
1 Day Post-Op   Subjective: No major complaints this morning, just sore. Voice okay. Swallowing okay. Had some drainage requiring dressing change last night but none this morning.  Objective: Vital signs in last 24 hours: Temp:  [97.5 F (36.4 C)-98.6 F (37 C)] 98.6 F (37 C) (06/20 0051) Pulse Rate:  [52-70] 65 (06/20 0608) Resp:  [9-19] 18 (06/20 0608) BP: (104-157)/(55-82) 104/57 (06/20 0608) SpO2:  [96 %-100 %] 96 % (06/20 1021)    Intake/Output from previous day: 06/19 0701 - 06/20 0700 In: 2015.8 [P.O.:480; I.V.:1485.8; IV Piggyback:50] Out: 1300 [Urine:1250; Blood:50] Intake/Output this shift: No intake/output data recorded.  General appearance: alert, cooperative and no distress Incision/Wound: Incision intact. No swelling or current drainage.  Lab Results:   Recent Labs  02/25/17 1206  WBC 4.7  HGB 11.9*  HCT 35.3*  PLT 228   BMET  Recent Labs  02/25/17 1206  NA 138  K 4.4  CL 103  CO2 30  GLUCOSE 105*  BUN 18  CREATININE 0.75  CALCIUM 9.7     Studies/Results: Dg Chest 2 View  Result Date: 02/25/2017 CLINICAL DATA:  Preoperative evaluation for upcoming thyroid surgery EXAM: CHEST  2 VIEW COMPARISON:  07/10/2016 FINDINGS: Cardiac shadow is at the upper limits normal but stable. Stable aortic calcifications are seen. Lungs are well aerated bilaterally without focal infiltrate or sizable effusion. No acute bony abnormality is seen. IMPRESSION: No active cardiopulmonary disease. Electronically Signed   By: Inez Catalina M.D.   On: 02/25/2017 13:48    Anti-infectives: Anti-infectives    Start     Dose/Rate Route Frequency Ordered Stop   02/26/17 0600  ceFAZolin (ANCEF) 3 g in dextrose 5 % 50 mL IVPB     3 g 130 mL/hr over 30 Minutes Intravenous On call to O.R. 02/25/17 1107 02/26/17 0805      Assessment/Plan: s/p Procedure(s): TOTAL THYROIDECTOMY Doing well without apparent complication. Okay for discharge this morning.    LOS: 0 days     Sheila Jacobs T 6/20/2018Patient ID: Sheila Jacobs, female   DOB: 02-Apr-1951, 66 y.o.   MRN: [id number]

## 2017-02-27 NOTE — Discharge Instructions (Signed)
[facility], [place] [phone]  THYROID/ PARATHYROID SURGERY: POST OP INSTRUCTIONS  Always review your discharge instruction sheet given to you by the facility where your surgery was performed.  IF YOU HAVE DISABILITY OR FAMILY LEAVE FORMS, YOU MUST BRING THEM TO THE OFFICE FOR PROCESSING.  PLEASE DO NOT GIVE THEM TO YOUR DOCTOR.  1. A prescription for pain medication may be given to you upon discharge.  Take your pain medication as prescribed, if needed.  If narcotic pain medicine is not needed, then you may take acetaminophen (Tylenol) or ibuprofen (Advil) as needed. 2. Take your usually prescribed medications unless otherwise directed. 3. If you need a refill on your pain medication, please contact your pharmacy. They will contact our office to request authorization.  Prescriptions will not be filled after 5pm or on week-ends. 4. You should follow a light diet the first 24 hours after arrival home, such as soup and crackers, etc.  Be sure to include lots of fluids daily.  Resume your normal diet the day after surgery. 5. Most patients will experience some swelling and bruising on the chest and neck area.  Ice packs will help.  Swelling and bruising can take several days to resolve.  6. It is common to experience some constipation if taking pain medication after surgery.  Increasing fluid intake and taking a stool softener will usually help or prevent this problem from occurring.  A mild laxative (Milk of Magnesia or Miralax) should be taken according to package directions if there are no bowel movements after 48 hours. 7. Unless discharge instructions indicate otherwise, you may remove your bandages 24-48 hours after surgery, and you may shower at that time.  You may have steri-strips (small skin tapes) in place directly over the incision.  These strips should be left on the skin for 7-10 days.  If your surgeon used skin glue on the incision, you may shower in 24 hours.  The  glue will flake off over the next 2-3 weeks.  Any sutures or staples will be removed at the office during your follow-up visit. 8. ACTIVITIES:  You may resume regular (light) daily activities beginning the next day--such as daily self-care, walking, climbing stairs--gradually increasing activities as tolerated.  You may have sexual intercourse when it is comfortable.  Refrain from any heavy lifting or straining until approved by your doctor. a. You may drive when you no longer are taking prescription pain medication, you can comfortably wear a seatbelt, and you can safely maneuver your car and apply brakes b. RETURN TO WORK:  __________________________________________________________ 9. You should see your doctor in the office for a follow-up appointment approximately two weeks after your surgery.  Make sure that you call for this appointment within a day or two after you arrive home to insure a convenient appointment time. 10. OTHER INSTRUCTIONS: ____May remove bandage and shower on Thursday. Leave paper strips on incision. Can place a new dressing if desired or leave dressing off. ________________________________________________________________________ _________________________________________________________________________________________________________________ _________________________________________________________________________________________________________________   WHEN TO CALL YOUR DOCTOR: 1. Fever over 101.0 2. Inability to urinate 3. Nausea and/or vomiting 4. Extreme swelling or bruising 5. Continued bleeding from incision. 6. Increased pain, redness, or drainage from the incision. 7. Difficulty swallowing or breathing 8. Muscle cramping or spasms. 9. Numbness or tingling in hands or feet or around lips.  The clinic staff is available to answer your questions during regular business hours.  Please dont hesitate to call and ask to  speak to one of the nurses if you have  concerns.  For further questions, please visit [email or web address]

## 2017-02-27 NOTE — Progress Notes (Signed)
Changed dressing before pt left to show daughter what to do, minimal drainage was noted.  D/C instructions and prescription was given. Understanding was verbalized.

## 2017-02-27 NOTE — Care Management Obs Status (Signed)
[facility] NOTIFICATION   Patient Details  Name: Sheila Jacobs MRN: [id number] Date of Birth: 10/02/1950   Medicare Observation Status Notification Given:  Yes    Guadalupe Maple, RN 02/27/2017, 11:44 AM

## 2017-02-27 NOTE — Discharge Summary (Signed)
  Patient ID: Sheila Jacobs [id number] 66 y.o. [birth year]  02/26/2017  Discharge date and time: 02/27/2017   Admitting Physician: Excell Seltzer T  Discharge Physician: Excell Seltzer T  Admission Diagnoses: goiter  Discharge Diagnoses: Same  Operations: Procedure(s): TOTAL THYROIDECTOMY  Admission Condition: good  Discharged Condition: good  Indication for Admission: Patient is a 66 year old female with a long history of goiter and recent onset of symptoms of occasional choking and swallowing difficulty. CT scan shows a 9 cm right thyroid lobe without dominant masses and displacement of neck structures. After preoperative discussion regarding benefits and risks detailed elsewhere we have elected to proceed with total thyroidectomy for her large symptomatic goiter.  Hospital Course: On the morning of admission the patient underwent an uneventful total thyroidectomy for a marked goiter. Her postoperative course was unremarkable. On the first postoperative morning she has had some mild drainage from her incision but no swelling or hematoma. Calcium is normal at 9.7. Voice is normal. She had mild discomfort. Felt ready for discharge.   Disposition: Home  Patient Instructions:  Allergies as of 02/27/2017      Reactions   Cortisone Rash   Effexor [venlafaxine] Palpitations   Lexapro [escitalopram] Palpitations   Heart racing      Medication List    TAKE these medications   ALPRAZolam 0.25 MG tablet Commonly known as:  XANAX TAKE ONE TABLET BY MOUTH TWICE DAILY AS NEEDED FOR ANXIETY   aspirin EC 81 MG tablet Take 81 mg by mouth daily.   calcium carbonate 500 MG chewable tablet Commonly known as:  TUMS - dosed in mg elemental calcium Chew 2 tablets (400 mg of elemental calcium total) by mouth 3 (three) times daily.   hydrochlorothiazide 25 MG tablet Commonly known as:  HYDRODIURIL Take 0.5 tablets (12.5 mg total) by mouth daily.   HYDROcodone-acetaminophen  5-325 MG tablet Commonly known as:  NORCO/VICODIN Take 1-2 tablets by mouth every 4 (four) hours as needed for moderate pain or severe pain.   ketoconazole 2 % cream Commonly known as:  NIZORAL APPLY TOPICALLY DAILY AS NEEDED FOR IRRITATION   letrozole 2.5 MG tablet Commonly known as:  FEMARA Take 1 tablet (2.5 mg total) by mouth daily.   levothyroxine 100 MCG tablet Commonly known as:  SYNTHROID, LEVOTHROID Take 1 tablet (100 mcg total) by mouth daily before breakfast.   pantoprazole 40 MG tablet Commonly known as:  PROTONIX Take 1 tablet (40 mg total) by mouth daily.   potassium chloride SA 20 MEQ tablet Commonly known as:  K-DUR,KLOR-CON Take 20 mEq by mouth every 30 (thirty) days.   VITAMIN D3 PO Take 1 tablet by mouth daily.       Activity: No strenuous activity for 3 weeks Diet: regular diet Wound Care: none needed  Follow-up:  With Dr. Excell Seltzer in 3 weeks.  Signed: Edward Jolly MD, FACS  02/27/2017, 7:37 AM

## 2017-02-28 LAB — CALCIUM, IONIZED: CALCIUM, IONIZED, SERUM: 5.4 mg/dL (ref 4.5–5.6)

## 2017-03-06 ENCOUNTER — Telehealth: Payer: Self-pay | Admitting: Family Medicine

## 2017-03-06 NOTE — Telephone Encounter (Signed)
Ok to refill until she establishes with new PCP

## 2017-03-06 NOTE — Telephone Encounter (Signed)
Pt called and is requesting a refill for her blood pressure medicine hydrochlorothiazide she is out and can not get into her new dr until July the 10th if she could get a 30 day supply to last her until then, pt uses [place], [place] - [address] pt can be reached at [phone]

## 2017-03-07 MED ORDER — HYDROCHLOROTHIAZIDE 25 MG PO TABS: 12.5000 mg | ORAL_TABLET | Freq: Every day | ORAL | 0 refills | Status: DC

## 2017-03-07 NOTE — Telephone Encounter (Signed)
done

## 2017-03-08 NOTE — Progress Notes (Deleted)
CLINIC:  Survivorship   REASON FOR VISIT:  Routine follow-up post-treatment for a recent history of breast cancer.  BRIEF ONCOLOGIC HISTORY:    Breast cancer of upper-outer quadrant of left female breast ([place])   01/27/2016 Initial Diagnosis    Screening mammogram: Left breast mass 9 mm, grade 1-2 IDC with DCIS, ER 100%, PR 90%, Ki-67 10%, HER-2 negative ratio 1.5, T1 BN 0 stage IA clinical stage      04/11/2016 Surgery    Left lumpectomy ([facility]): IDC grade 3, 1.2 cm, IG DCIS, LVI present, 0/8 lymph nodes negative, ER 100%, PR 90%, HER-2 negative, Ki-67 10%, T1 cN0 stage I a pathologic stage      06/18/2016 - 07/30/2016 Radiation Therapy    Adj XRT Isidore Moos): 1) Left Breast / 45 Gy in 25 fractions.  2) Left Breast Boost /  6 Gy in 3 fractions      08/29/2016 -  Anti-estrogen oral therapy    Letrozole 2.5 mg daily       INTERVAL HISTORY:  Ms. Berrong presents to the [facility] today for our initial meeting to review her survivorship care plan detailing her treatment course for breast cancer, as well as monitoring long-term side effects of that treatment, education regarding health maintenance, screening, and overall wellness and health promotion.     Overall, Ms. Solum reports feeling quite well since completing her radiation therapy approximately 3 months ago.  She ***    REVIEW OF SYSTEMS:  Review of Systems - Oncology Breast: Denies any new nodularity, masses, tenderness, nipple changes, or nipple discharge.      ONCOLOGY TREATMENT TEAM:  1. Surgeon:  Dr. Marland Kitchen at [facility] 2. Medical Oncologist: Dr. Marland Kitchen  3. Radiation Oncologist: Dr. Marland Kitchen    PAST MEDICAL/SURGICAL HISTORY:  Past Medical History:  Diagnosis Date  . Anginal pain ([place])   . Anxiety   . Arthritis   . Breast cancer of upper-outer quadrant of left female breast ([place]) 01/31/2016  . Colon polyps   . Enlarged LA (left atrium)   . GERD (gastroesophageal reflux disease)   . Hard  of hearing    Wears hearing aids.  . Headache   . Heavy cigarette smoker    Quit 2015  . History of radiation therapy 06/18/16- 07/30/16   Left Breast 45 Gy in 25 fractions, Left Breast Boost 6 Gy in 3 fractions.   . Hyperlipidemia   . Hypertension   . Morbid obesity ([place])   . Numbness and tingling   . Obesity   . Thyroid goiter   . Vertigo    on occasion   Past Surgical History:  Procedure Laterality Date  . BREAST LUMPECTOMY WITH RADIOACTIVE SEED AND SENTINEL LYMPH NODE BIOPSY Left 04/11/2016   Procedure: BREAST LUMPECTOMY WITH RADIOACTIVE SEED AND SENTINEL LYMPH NODE BIOPSY;  Surgeon: Excell Seltzer, MD;  Location: [facility];  Service: General;  Laterality: Left;  . COLONOSCOPY WITH PROPOFOL N/A 10/20/2015   Procedure: COLONOSCOPY WITH PROPOFOL;  Surgeon: Mauri Pole, MD;  Location: [facility];  Service: Endoscopy;  Laterality: N/A;  . ESOPHAGOGASTRODUODENOSCOPY (EGD) WITH PROPOFOL N/A 10/20/2015   Procedure: ESOPHAGOGASTRODUODENOSCOPY (EGD) WITH PROPOFOL;  Surgeon: Mauri Pole, MD;  Location: [facility];  Service: Endoscopy;  Laterality: N/A;  . LEFT HEART CATHETERIZATION WITH CORONARY ANGIOGRAM N/A 10/21/2014   Procedure: LEFT HEART CATHETERIZATION WITH CORONARY ANGIOGRAM;  Surgeon: Burnell Blanks, MD;  Location: [facility] CATH LAB;  Service: Cardiovascular;  Laterality: N/A;  . THYROIDECTOMY N/A  02/26/2017   Procedure: TOTAL THYROIDECTOMY;  Surgeon: Excell Seltzer, MD;  Location: [facility];  Service: General;  Laterality: N/A;  . TUBAL LIGATION       ALLERGIES:  Allergies  Allergen Reactions  . Cortisone Rash  . Effexor [Venlafaxine] Palpitations  . Lexapro [Escitalopram] Palpitations    Heart racing     CURRENT MEDICATIONS:  Outpatient Encounter Prescriptions as of 03/11/2017  Medication Sig Note  . ALPRAZolam (XANAX) 0.25 MG tablet TAKE ONE TABLET BY MOUTH TWICE DAILY AS NEEDED FOR ANXIETY   . aspirin EC 81 MG tablet Take 81 mg by mouth daily.   .  calcium carbonate (TUMS - DOSED IN MG ELEMENTAL CALCIUM) 500 MG chewable tablet Chew 2 tablets (400 mg of elemental calcium total) by mouth 3 (three) times daily.   . Cholecalciferol (VITAMIN D3 PO) Take 1 tablet by mouth daily.   . hydrochlorothiazide (HYDRODIURIL) 25 MG tablet Take 0.5 tablets (12.5 mg total) by mouth daily.   Marland Kitchen HYDROcodone-acetaminophen (NORCO/VICODIN) 5-325 MG tablet Take 1-2 tablets by mouth every 4 (four) hours as needed for moderate pain or severe pain.   Marland Kitchen ketoconazole (NIZORAL) 2 % cream APPLY TOPICALLY DAILY AS NEEDED FOR IRRITATION   . letrozole (FEMARA) 2.5 MG tablet Take 1 tablet (2.5 mg total) by mouth daily. (Patient not taking: Reported on 12/13/2016)   . levothyroxine (SYNTHROID, LEVOTHROID) 100 MCG tablet Take 1 tablet (100 mcg total) by mouth daily before breakfast.   . pantoprazole (PROTONIX) 40 MG tablet Take 1 tablet (40 mg total) by mouth daily.   . potassium chloride SA (K-DUR,KLOR-CON) 20 MEQ tablet Take 20 mEq by mouth every 30 (thirty) days. 02/20/2017: Eats bananas, potassium tablets make her chest hurt and she doesn't like to take them    No facility-administered encounter medications on file as of 03/11/2017.      ONCOLOGIC FAMILY HISTORY:  Family History  Problem Relation Age of Onset  . Hypertension Mother   . Heart disease Mother        Pacemaker  . Kidney disease Mother   . Cataracts Mother   . Hearing loss Mother   . Diabetes Mother   . Liver disease Mother   . Diabetes Sister   . Cataracts Brother   . Hearing loss Brother   . Colon cancer Neg Hx   . Thyroid disease Neg Hx      GENETIC COUNSELING/TESTING: ***  SOCIAL HISTORY:  SANIKA BROSIOUS is /single/married/divorced/widowed/separated and lives alone/with her spouse/family/friend in (city), [place].  She has (#) children and they live in (city).  Ms. Richwine is currently retired/disabled/working part-time/full-time as ***.  She denies any current or history of tobacco,  alcohol, or illicit drug use.     PHYSICAL EXAMINATION:  Vital Signs:  There were no vitals filed for this visit. There were no vitals filed for this visit. General: Well-nourished, well-appearing female in no acute distress.  She is unaccompanied/accompanied in clinic by her ***** today.   HEENT: Head is normocephalic.  Pupils equal and reactive to light. Conjunctivae clear without exudate.  Sclerae anicteric. Oral mucosa is pink, moist.  Oropharynx is pink without lesions or erythema.  Lymph: No cervical, supraclavicular, or infraclavicular lymphadenopathy noted on palpation.  Cardiovascular: Regular rate and rhythm.Marland Kitchen Respiratory: Clear to auscultation bilaterally. Chest expansion symmetric; breathing non-labored.  GI: Abdomen soft and round; non-tender, non-distended. Bowel sounds normoactive.  GU: Deferred.  Neuro: No focal deficits. Steady gait.  Psych: Mood and affect normal and appropriate for  situation.  Extremities: No edema. MSK: No focal spinal tenderness to palpation.  Full range of motion in bilateral upper extremities Skin: Warm and dry.  LABORATORY DATA:  None for this visit.  DIAGNOSTIC IMAGING:  None for this visit.      ASSESSMENT AND PLAN:  Ms.. Brossard is a pleasant 66 y.o. female with Stage *** right/left breast invasive ductal carcinoma, ER+/PR+/HER2-, diagnosed in (date), treated with lumpectomy, adjuvant radiation therapy, and anti-estrogen therapy with *** beginning in (date).  She presents to the [facility]orship Clinic for our initial meeting and routine follow-up post-completion of treatment for breast cancer.    1. Stage *** right/left breast cancer:  Ms. Ringstad is continuing to recover from definitive treatment for breast cancer. She will follow-up with her medical oncologist, Dr. Ross Ludwig in (month) /2017 with history and physical exam per surveillance protocol.  She will continue her anti-estrogen therapy with (drug). Thus far, she is  tolerating the *** well, with minimal side effects. She was instructed to make Dr. Lindi Adie or myself aware if she begins to experience any worsening side effects of the medication and I could see her back in clinic to help manage those side effects, as needed. Though the incidence is low, there is an associated risk of endometrial cancer with anti-estrogen therapies like Tamoxifen.  Ms. Ildefonso was encouraged to contact Dr. Carrington Clamp or myself with any vaginal bleeding while taking Tamoxifen. Other side effects of Tamoxifen were again reviewed with her as well. Today, a comprehensive survivorship care plan and treatment summary was reviewed with the patient today detailing her breast cancer diagnosis, treatment course, potential late/long-term effects of treatment, appropriate follow-up care with recommendations for the future, and patient education resources.  A copy of this summary, along with a letter will be sent to the patient's primary care provider via mail/fax/In Basket message after today's visit.    #. Problem(s) at Visit______________  #. Bone health:  Given Ms. Hudon's age/history of breast cancer and her current treatment regimen including anti-estrogen therapy with _______, she is at risk for bone demineralization.  Her last DEXA scan was **/**/20**, which showed (results).***  In the meantime, she was encouraged to increase her consumption of foods rich in calcium, as well as increase her weight-bearing activities.  She was given education on specific activities to promote bone health.  #. Cancer screening:  Due to Ms. Travaglini's history and her age, she should receive screening for skin cancers, colon cancer, and gynecologic cancers.  The information and recommendations are listed on the patient's comprehensive care plan/treatment summary and were reviewed in detail with the patient.    #. Health maintenance and wellness promotion: Ms. Montee was encouraged to consume 5-7 servings  of fruits and vegetables per day. We reviewed the "Nutrition Rainbow" handout, as well as the handout "Take Control of Your Health and Reduce Your Cancer Risk" from the [facility].  She was also encouraged to engage in moderate to vigorous exercise for 30 minutes per day most days of the week. We discussed the [employer] fitness program, which is designed for cancer survivors to help them become more physically fit after cancer treatments.  She was instructed to limit her alcohol consumption and continue to abstain from tobacco use/***was encouraged stop smoking.     #. Support services/counseling: It is not uncommon for this period of the patient's cancer care trajectory to be one of many emotions and stressors.  We discussed an opportunity for her to participate in the  next session of [facility] ("Finding Your New Normal") support group series designed for patients after they have completed treatment.   Ms. Sultan was encouraged to take advantage of our many other support services programs, support groups, and/or counseling in coping with her new life as a cancer survivor after completing anti-cancer treatment.  She was offered support today through active listening and expressive supportive counseling.  She was given information regarding our available services and encouraged to contact me with any questions or for help enrolling in any of our support group/programs.    Dispo:   -Return to cancer center ***  -Mammogram due in *** -Follow up with surgery *** -She is welcome to return back to the Survivorship Clinic at any time; no additional follow-up needed at this time.  -Consider referral back to survivorship as a long-term survivor for continued surveillance  A total of (30) minutes of face-to-face time was spent with this patient with greater than 50% of that time in counseling and care-coordination.   Gardenia Phlegm, NP Survivorship [facility] [place] [phone]   Note: PRIMARY CARE PROVIDER No primary care provider on file. None None

## 2017-03-11 ENCOUNTER — Encounter: Payer: Self-pay | Admitting: Adult Health

## 2017-03-12 DIAGNOSIS — E038 Other specified hypothyroidism: Secondary | ICD-10-CM | POA: Diagnosis not present

## 2017-03-12 DIAGNOSIS — E559 Vitamin D deficiency, unspecified: Secondary | ICD-10-CM | POA: Diagnosis not present

## 2017-03-12 DIAGNOSIS — I10 Essential (primary) hypertension: Secondary | ICD-10-CM | POA: Diagnosis not present

## 2017-03-15 DIAGNOSIS — E039 Hypothyroidism, unspecified: Secondary | ICD-10-CM | POA: Diagnosis not present

## 2017-03-19 DIAGNOSIS — E559 Vitamin D deficiency, unspecified: Secondary | ICD-10-CM | POA: Diagnosis not present

## 2017-03-19 DIAGNOSIS — Z1389 Encounter for screening for other disorder: Secondary | ICD-10-CM | POA: Diagnosis not present

## 2017-03-19 DIAGNOSIS — Z23 Encounter for immunization: Secondary | ICD-10-CM | POA: Diagnosis not present

## 2017-03-19 DIAGNOSIS — H938X9 Other specified disorders of ear, unspecified ear: Secondary | ICD-10-CM | POA: Diagnosis not present

## 2017-03-19 DIAGNOSIS — Z0000 Encounter for general adult medical examination without abnormal findings: Secondary | ICD-10-CM | POA: Diagnosis not present

## 2017-03-19 DIAGNOSIS — E038 Other specified hypothyroidism: Secondary | ICD-10-CM | POA: Diagnosis not present

## 2017-03-27 ENCOUNTER — Telehealth: Payer: Self-pay

## 2017-03-27 ENCOUNTER — Other Ambulatory Visit: Payer: Self-pay

## 2017-03-27 MED ORDER — ANASTROZOLE 1 MG PO TABS: 1.0000 mg | ORAL_TABLET | Freq: Every day | ORAL | 0 refills | Status: DC

## 2017-03-27 NOTE — Telephone Encounter (Signed)
Pt called to explain that letrozole medication prescribed by Dr. Lindi Adie had caused her to have a headache for two days. Pt wants a generic version of replacement for this medication because she, "cannot take the side effects." Discussed with MD who would like pt to stop letrozole for 2 weeks and begin on anastrazole for 1 month and follow to see how pt does on that medication. Called pt back and she has not been taking letrozole for 5 months or longer. Plan to send prescription for anastrozole to pt designated pharmacy.

## 2017-03-27 NOTE — Telephone Encounter (Signed)
Patient called in and r/s her missed appts

## 2017-04-05 DIAGNOSIS — E890 Postprocedural hypothyroidism: Secondary | ICD-10-CM | POA: Diagnosis not present

## 2017-04-05 LAB — TSH: TSH: 14.56 — AB (ref 0.41–5.90)

## 2017-04-10 DIAGNOSIS — H903 Sensorineural hearing loss, bilateral: Secondary | ICD-10-CM | POA: Diagnosis not present

## 2017-04-15 ENCOUNTER — Other Ambulatory Visit: Payer: Self-pay | Admitting: Family Medicine

## 2017-04-15 ENCOUNTER — Telehealth: Payer: Self-pay

## 2017-04-15 NOTE — Telephone Encounter (Signed)
Pt is no longer a pt here 

## 2017-04-15 NOTE — Telephone Encounter (Signed)
Pt called to r/s appt on 8/29. She has conflicting MD appt. In basket sent

## 2017-04-16 ENCOUNTER — Telehealth: Payer: Self-pay

## 2017-04-16 NOTE — Telephone Encounter (Signed)
No voicemail on home nu,ber but left a vm on cell as per inbasket message that patient had a conflict on 8/27 and I she was advised to call to r/s

## 2017-04-17 ENCOUNTER — Telehealth: Payer: Self-pay | Admitting: *Deleted

## 2017-04-17 NOTE — Telephone Encounter (Signed)
FYI "I'm trying to reach you all and you are having trouble calling me back.  The latest medicine prescribed by Dr. Lindi Adie is makes my body feel bad.  Bottom sore, it's too strong so I've decided to stop the medicine because it's bothering me.  It's messing with my heart, starting to have chest pain.  Maybe it's too strong, need a generic."  Received voicemail.  Message left patient requesting return call.  called home number unsuccessfully.   Unable to reach emergency contact.  Awaiting return call from patient.

## 2017-04-18 NOTE — Telephone Encounter (Signed)
LVM at home number with call back number.

## 2017-04-21 ENCOUNTER — Other Ambulatory Visit: Payer: Self-pay | Admitting: Family Medicine

## 2017-04-29 ENCOUNTER — Other Ambulatory Visit: Payer: Self-pay | Admitting: Podiatry

## 2017-05-01 ENCOUNTER — Other Ambulatory Visit: Payer: Self-pay | Admitting: Family Medicine

## 2017-05-01 NOTE — Telephone Encounter (Signed)
She switched PCP as far as I know.

## 2017-05-01 NOTE — Telephone Encounter (Signed)
Can pt have a refill on this 

## 2017-05-08 ENCOUNTER — Encounter: Payer: Self-pay | Admitting: Adult Health

## 2017-05-08 DIAGNOSIS — Z23 Encounter for immunization: Secondary | ICD-10-CM | POA: Diagnosis not present

## 2017-05-08 DIAGNOSIS — H9193 Unspecified hearing loss, bilateral: Secondary | ICD-10-CM | POA: Diagnosis not present

## 2017-05-08 DIAGNOSIS — E038 Other specified hypothyroidism: Secondary | ICD-10-CM | POA: Diagnosis not present

## 2017-05-08 DIAGNOSIS — K219 Gastro-esophageal reflux disease without esophagitis: Secondary | ICD-10-CM | POA: Diagnosis not present

## 2017-05-08 DIAGNOSIS — E559 Vitamin D deficiency, unspecified: Secondary | ICD-10-CM | POA: Diagnosis not present

## 2017-05-08 DIAGNOSIS — Z780 Asymptomatic menopausal state: Secondary | ICD-10-CM | POA: Diagnosis not present

## 2017-05-21 DIAGNOSIS — Z961 Presence of intraocular lens: Secondary | ICD-10-CM | POA: Diagnosis not present

## 2017-05-23 NOTE — Progress Notes (Deleted)
CLINIC:  Survivorship   REASON FOR VISIT:  Routine follow-up post-treatment for a recent history of breast cancer.  BRIEF ONCOLOGIC HISTORY:    Breast cancer of upper-outer quadrant of left female breast ([place])   01/27/2016 Initial Diagnosis    Screening mammogram: Left breast mass 9 mm, grade 1-2 IDC with DCIS, ER 100%, PR 90%, Ki-67 10%, HER-2 negative ratio 1.5, T1 BN 0 stage IA clinical stage      04/11/2016 Surgery    Left lumpectomy ([facility]): IDC grade 3, 1.2 cm, IG DCIS, LVI present, 0/8 lymph nodes negative, ER 100%, PR 90%, HER-2 negative, Ki-67 10%, T1 cN0 stage I a pathologic stage      06/18/2016 - 07/30/2016 Radiation Therapy    Adj XRT Isidore Moos): 1) Left Breast / 45 Gy in 25 fractions.  2) Left Breast Boost /  6 Gy in 3 fractions      08/29/2016 -  Anti-estrogen oral therapy    Letrozole 2.5 mg daily       INTERVAL HISTORY:  Ms. Berrong presents to the [facility] today for our initial meeting to review her survivorship care plan detailing her treatment course for breast cancer, as well as monitoring long-term side effects of that treatment, education regarding health maintenance, screening, and overall wellness and health promotion.     Overall, Ms. Solum reports feeling quite well since completing her radiation therapy approximately 3 months ago.  She ***    REVIEW OF SYSTEMS:  Review of Systems - Oncology Breast: Denies any new nodularity, masses, tenderness, nipple changes, or nipple discharge.      ONCOLOGY TREATMENT TEAM:  1. Surgeon:  Dr. Marland Kitchen at [facility] 2. Medical Oncologist: Dr. Marland Kitchen  3. Radiation Oncologist: Dr. Marland Kitchen    PAST MEDICAL/SURGICAL HISTORY:  Past Medical History:  Diagnosis Date  . Anginal pain ([place])   . Anxiety   . Arthritis   . Breast cancer of upper-outer quadrant of left female breast ([place]) 01/31/2016  . Colon polyps   . Enlarged LA (left atrium)   . GERD (gastroesophageal reflux disease)   . Hard  of hearing    Wears hearing aids.  . Headache   . Heavy cigarette smoker    Quit 2015  . History of radiation therapy 06/18/16- 07/30/16   Left Breast 45 Gy in 25 fractions, Left Breast Boost 6 Gy in 3 fractions.   . Hyperlipidemia   . Hypertension   . Morbid obesity ([place])   . Numbness and tingling   . Obesity   . Thyroid goiter   . Vertigo    on occasion   Past Surgical History:  Procedure Laterality Date  . BREAST LUMPECTOMY WITH RADIOACTIVE SEED AND SENTINEL LYMPH NODE BIOPSY Left 04/11/2016   Procedure: BREAST LUMPECTOMY WITH RADIOACTIVE SEED AND SENTINEL LYMPH NODE BIOPSY;  Surgeon: Excell Seltzer, MD;  Location: [facility];  Service: General;  Laterality: Left;  . COLONOSCOPY WITH PROPOFOL N/A 10/20/2015   Procedure: COLONOSCOPY WITH PROPOFOL;  Surgeon: Mauri Pole, MD;  Location: [facility];  Service: Endoscopy;  Laterality: N/A;  . ESOPHAGOGASTRODUODENOSCOPY (EGD) WITH PROPOFOL N/A 10/20/2015   Procedure: ESOPHAGOGASTRODUODENOSCOPY (EGD) WITH PROPOFOL;  Surgeon: Mauri Pole, MD;  Location: [facility];  Service: Endoscopy;  Laterality: N/A;  . LEFT HEART CATHETERIZATION WITH CORONARY ANGIOGRAM N/A 10/21/2014   Procedure: LEFT HEART CATHETERIZATION WITH CORONARY ANGIOGRAM;  Surgeon: Burnell Blanks, MD;  Location: [facility] CATH LAB;  Service: Cardiovascular;  Laterality: N/A;  . THYROIDECTOMY N/A  02/26/2017   Procedure: TOTAL THYROIDECTOMY;  Surgeon: Excell Seltzer, MD;  Location: [facility];  Service: General;  Laterality: N/A;  . TUBAL LIGATION       ALLERGIES:  Allergies  Allergen Reactions  . Cortisone Rash  . Effexor [Venlafaxine] Palpitations  . Lexapro [Escitalopram] Palpitations    Heart racing     CURRENT MEDICATIONS:  Outpatient Encounter Prescriptions as of 05/24/2017  Medication Sig Note  . ALPRAZolam (XANAX) 0.25 MG tablet TAKE ONE TABLET BY MOUTH TWICE DAILY AS NEEDED FOR ANXIETY   . anastrozole (ARIMIDEX) 1 MG tablet Take 1 tablet (1 mg total)  by mouth daily.   Marland Kitchen aspirin EC 81 MG tablet Take 81 mg by mouth daily.   . calcium carbonate (TUMS - DOSED IN MG ELEMENTAL CALCIUM) 500 MG chewable tablet Chew 2 tablets (400 mg of elemental calcium total) by mouth 3 (three) times daily.   . Cholecalciferol (VITAMIN D3 PO) Take 1 tablet by mouth daily.   . hydrochlorothiazide (HYDRODIURIL) 25 MG tablet Take 0.5 tablets (12.5 mg total) by mouth daily.   Marland Kitchen HYDROcodone-acetaminophen (NORCO/VICODIN) 5-325 MG tablet Take 1-2 tablets by mouth every 4 (four) hours as needed for moderate pain or severe pain.   Marland Kitchen ketoconazole (NIZORAL) 2 % cream APPLY TOPICALLY DAILY AS NEEDED FOR IRRITATION   . levothyroxine (SYNTHROID, LEVOTHROID) 100 MCG tablet Take 1 tablet (100 mcg total) by mouth daily before breakfast.   . pantoprazole (PROTONIX) 40 MG tablet Take 1 tablet (40 mg total) by mouth daily.   . potassium chloride SA (K-DUR,KLOR-CON) 20 MEQ tablet Take 20 mEq by mouth every 30 (thirty) days. 02/20/2017: Eats bananas, potassium tablets make her chest hurt and she doesn't like to take them    No facility-administered encounter medications on file as of 05/24/2017.      ONCOLOGIC FAMILY HISTORY:  Family History  Problem Relation Age of Onset  . Hypertension Mother   . Heart disease Mother        Pacemaker  . Kidney disease Mother   . Cataracts Mother   . Hearing loss Mother   . Diabetes Mother   . Liver disease Mother   . Diabetes Sister   . Cataracts Brother   . Hearing loss Brother   . Colon cancer Neg Hx   . Thyroid disease Neg Hx      GENETIC COUNSELING/TESTING: ***  SOCIAL HISTORY:  JAILAH WILLIS is /single/married/divorced/widowed/separated and lives alone/with her spouse/family/friend in (city), [place].  She has (#) children and they live in (city).  Ms. Majid is currently retired/disabled/working part-time/full-time as ***.  She denies any current or history of tobacco, alcohol, or illicit drug use.     PHYSICAL  EXAMINATION:  Vital Signs:  There were no vitals filed for this visit. There were no vitals filed for this visit. General: Well-nourished, well-appearing female in no acute distress.  She is unaccompanied/accompanied in clinic by her ***** today.   HEENT: Head is normocephalic.  Pupils equal and reactive to light. Conjunctivae clear without exudate.  Sclerae anicteric. Oral mucosa is pink, moist.  Oropharynx is pink without lesions or erythema.  Lymph: No cervical, supraclavicular, or infraclavicular lymphadenopathy noted on palpation.  Cardiovascular: Regular rate and rhythm.Marland Kitchen Respiratory: Clear to auscultation bilaterally. Chest expansion symmetric; breathing non-labored.  GI: Abdomen soft and round; non-tender, non-distended. Bowel sounds normoactive.  GU: Deferred.  Neuro: No focal deficits. Steady gait.  Psych: Mood and affect normal and appropriate for situation.  Extremities: No edema. MSK:  No focal spinal tenderness to palpation.  Full range of motion in bilateral upper extremities Skin: Warm and dry.  LABORATORY DATA:  None for this visit.  DIAGNOSTIC IMAGING:  None for this visit.      ASSESSMENT AND PLAN:  Ms.. Kapler is a pleasant 66 y.o. female with Stage *** right/left breast invasive ductal carcinoma, ER+/PR+/HER2-, diagnosed in (date), treated with lumpectomy, adjuvant radiation therapy, and anti-estrogen therapy with *** beginning in (date).  She presents to the Survivorship Clinic for our initial meeting and routine follow-up post-completion of treatment for breast cancer.    1. Stage *** right/left breast cancer:  Ms. Fatula is continuing to recover from definitive treatment for breast cancer. She will follow-up with her medical oncologist, Dr. Ross Ludwig in (month) /2017 with history and physical exam per surveillance protocol.  She will continue her anti-estrogen therapy with (drug). Thus far, she is tolerating the *** well, with minimal side effects.  She was instructed to make Dr. Lindi Adie or myself aware if she begins to experience any worsening side effects of the medication and I could see her back in clinic to help manage those side effects, as needed. Though the incidence is low, there is an associated risk of endometrial cancer with anti-estrogen therapies like Tamoxifen.  Ms. Lindler was encouraged to contact Dr. Carrington Clamp or myself with any vaginal bleeding while taking Tamoxifen. Other side effects of Tamoxifen were again reviewed with her as well. Today, a comprehensive survivorship care plan and treatment summary was reviewed with the patient today detailing her breast cancer diagnosis, treatment course, potential late/long-term effects of treatment, appropriate follow-up care with recommendations for the future, and patient education resources.  A copy of this summary, along with a letter will be sent to the patient's primary care provider via mail/fax/In Basket message after today's visit.    #. Problem(s) at Visit______________  #. Bone health:  Given Ms. Walthers's age/history of breast cancer and her current treatment regimen including anti-estrogen therapy with _______, she is at risk for bone demineralization.  Her last DEXA scan was **/**/20**, which showed (results).***  In the meantime, she was encouraged to increase her consumption of foods rich in calcium, as well as increase her weight-bearing activities.  She was given education on specific activities to promote bone health.  #. Cancer screening:  Due to Ms. Landress's history and her age, she should receive screening for skin cancers, colon cancer, and gynecologic cancers.  The information and recommendations are listed on the patient's comprehensive care plan/treatment summary and were reviewed in detail with the patient.    #. Health maintenance and wellness promotion: Ms. File was encouraged to consume 5-7 servings of fruits and vegetables per day. We reviewed the  "Nutrition Rainbow" handout, as well as the handout "Take Control of Your Health and Reduce Your Cancer Risk" from the [facility].  She was also encouraged to engage in moderate to vigorous exercise for 30 minutes per day most days of the week. We discussed the [employer] fitness program, which is designed for cancer survivors to help them become more physically fit after cancer treatments.  She was instructed to limit her alcohol consumption and continue to abstain from tobacco use/***was encouraged stop smoking.     #. Support services/counseling: It is not uncommon for this period of the patient's cancer care trajectory to be one of many emotions and stressors.  We discussed an opportunity for her to participate in the next session of [facility] ("Finding Your  New Normal") support group series designed for patients after they have completed treatment.   Ms. Albano was encouraged to take advantage of our many other support services programs, support groups, and/or counseling in coping with her new life as a cancer survivor after completing anti-cancer treatment.  She was offered support today through active listening and expressive supportive counseling.  She was given information regarding our available services and encouraged to contact me with any questions or for help enrolling in any of our support group/programs.    Dispo:   -Return to cancer center ***  -Mammogram due in *** -Follow up with surgery *** -She is welcome to return back to the Survivorship Clinic at any time; no additional follow-up needed at this time.  -Consider referral back to survivorship as a long-term survivor for continued surveillance  A total of (30) minutes of face-to-face time was spent with this patient with greater than 50% of that time in counseling and care-coordination.   Gardenia Phlegm, NP Survivorship Program [place] [phone]   Note: PRIMARY CARE PROVIDER No  primary care provider on file. None None

## 2017-05-24 ENCOUNTER — Encounter: Payer: Self-pay | Admitting: Endocrinology

## 2017-05-24 ENCOUNTER — Encounter: Payer: Self-pay | Admitting: Adult Health

## 2017-05-24 NOTE — Progress Notes (Unsigned)
Lab for TSH from [facility] abstracted

## 2017-05-27 ENCOUNTER — Encounter: Payer: Self-pay | Admitting: Adult Health

## 2017-05-27 ENCOUNTER — Telehealth: Payer: Self-pay

## 2017-05-27 ENCOUNTER — Ambulatory Visit (HOSPITAL_BASED_OUTPATIENT_CLINIC_OR_DEPARTMENT_OTHER): Payer: Medicare Other | Admitting: Adult Health

## 2017-05-27 VITALS — BP 151/65 | HR 98 | Temp 97.9°F | Resp 18 | Ht 63.0 in | Wt 303.3 lb

## 2017-05-27 DIAGNOSIS — Z79811 Long term (current) use of aromatase inhibitors: Secondary | ICD-10-CM

## 2017-05-27 DIAGNOSIS — Z170 Estrogen receptor positive status [ER+]: Secondary | ICD-10-CM | POA: Diagnosis not present

## 2017-05-27 DIAGNOSIS — C50412 Malignant neoplasm of upper-outer quadrant of left female breast: Secondary | ICD-10-CM | POA: Diagnosis not present

## 2017-05-27 NOTE — Progress Notes (Signed)
CLINIC:  Survivorship   REASON FOR VISIT:  Routine follow-up post-treatment for a recent history of breast cancer.  BRIEF ONCOLOGIC HISTORY:    Breast cancer of upper-outer quadrant of left female breast ([place])   01/27/2016 Initial Diagnosis    Screening mammogram: Left breast mass 9 mm, grade 1-2 IDC with DCIS, ER 100%, PR 90%, Ki-67 10%, HER-2 negative ratio 1.5, T1 BN 0 stage IA clinical stage      04/11/2016 Surgery    Left lumpectomy ([facility]): IDC grade 3, 1.2 cm, IG DCIS, LVI present, 0/8 lymph nodes negative, ER 100%, PR 90%, HER-2 negative, Ki-67 10%, T1 cN0 stage I a pathologic stage      06/18/2016 - 07/30/2016 Radiation Therapy    Adj XRT Isidore Moos): 1) Left Breast / 45 Gy in 25 fractions.  2) Left Breast Boost /  6 Gy in 3 fractions      08/29/2016 -  Anti-estrogen oral therapy    Anastrozole daily       INTERVAL HISTORY:  Sheila Jacobs presents to the [facility] today for our initial meeting to review her survivorship care plan detailing her treatment course for breast cancer, as well as monitoring long-term side effects of that treatment, education regarding health maintenance, screening, and overall wellness and health promotion.     Overall, Sheila Jacobs reports feeling moderately well today.  She is taking Anastrozole daily.  She does feel achy with this.  She is also on Synthroid daily and this was just dose adjusted and she is c/o what she thinks are possibly side effects from this.  She did undergo thyroidectomy by Dr. Excell Seltzer in June of this year.  She has missed multiple appointments with [place] this year and she tells me that this is related to difficulty with securing transportation.      REVIEW OF SYSTEMS:  Review of Systems  Constitutional: Negative for appetite change, chills, fatigue, fever and unexpected weight change.  HENT:   Negative for hearing loss and lump/mass.   Eyes: Negative for eye problems and icterus.  Respiratory: Negative for  chest tightness, cough and shortness of breath.   Cardiovascular: Negative for chest pain, leg swelling and palpitations.  Gastrointestinal: Negative for abdominal distention, abdominal pain, constipation, diarrhea, nausea and vomiting.  Endocrine: Negative for hot flashes.  Musculoskeletal: Positive for arthralgias.  Skin: Negative for itching and rash.  Neurological: Negative for dizziness, extremity weakness and headaches.  Hematological: Negative for adenopathy. Does not bruise/bleed easily.  Psychiatric/Behavioral: Negative for depression. The patient is not nervous/anxious.   Breast: Denies any new nodularity, masses, tenderness, nipple changes, or nipple discharge.      ONCOLOGY TREATMENT TEAM:  1. Surgeon:  Dr. Excell Seltzer at [facility] 2. Medical Oncologist: Dr. Lindi Adie  3. Radiation Oncologist: Dr. Isidore Moos    PAST MEDICAL/SURGICAL HISTORY:  Past Medical History:  Diagnosis Date  . Anginal pain ([place])   . Anxiety   . Arthritis   . Breast cancer of upper-outer quadrant of left female breast ([place]) 01/31/2016  . Colon polyps   . Enlarged LA (left atrium)   . GERD (gastroesophageal reflux disease)   . Hard of hearing    Wears hearing aids.  . Headache   . Heavy cigarette smoker    Quit 2015  . History of radiation therapy 06/18/16- 07/30/16   Left Breast 45 Gy in 25 fractions, Left Breast Boost 6 Gy in 3 fractions.   . Hyperlipidemia   . Hypertension   . Morbid  obesity ([facility])   . Numbness and tingling   . Obesity   . Thyroid goiter   . Vertigo    on occasion   Past Surgical History:  Procedure Laterality Date  . BREAST LUMPECTOMY WITH RADIOACTIVE SEED AND SENTINEL LYMPH NODE BIOPSY Left 04/11/2016   Procedure: BREAST LUMPECTOMY WITH RADIOACTIVE SEED AND SENTINEL LYMPH NODE BIOPSY;  Surgeon: Glenna Fellows, MD;  [facility]: [facility];  Service: General;  Laterality: Left;  . COLONOSCOPY WITH PROPOFOL N/A 10/20/2015   Procedure: COLONOSCOPY WITH PROPOFOL;  Surgeon:  Napoleon Form, MD;  Location: [facility];  Service: Endoscopy;  Laterality: N/A;  . ESOPHAGOGASTRODUODENOSCOPY (EGD) WITH PROPOFOL N/A 10/20/2015   Procedure: ESOPHAGOGASTRODUODENOSCOPY (EGD) WITH PROPOFOL;  Surgeon: Napoleon Form, MD;  Location: [facility];  Service: Endoscopy;  Laterality: N/A;  . LEFT HEART CATHETERIZATION WITH CORONARY ANGIOGRAM N/A 10/21/2014   Procedure: LEFT HEART CATHETERIZATION WITH CORONARY ANGIOGRAM;  Surgeon: Kathleene Hazel, MD;  Location: [facility] CATH LAB;  Service: Cardiovascular;  Laterality: N/A;  . THYROIDECTOMY N/A 02/26/2017   Procedure: TOTAL THYROIDECTOMY;  Surgeon: Glenna Fellows, MD;  Location: [facility]S;  Service: General;  Laterality: N/A;  . TUBAL LIGATION       ALLERGIES:  Allergies  Allergen Reactions  . Cortisone Rash  . Effexor [Venlafaxine] Palpitations  . Lexapro [Escitalopram] Palpitations    Heart racing     CURRENT MEDICATIONS:  Outpatient Encounter Prescriptions as of 05/27/2017  Medication Sig Note  . ALPRAZolam (XANAX) 0.25 MG tablet TAKE ONE TABLET BY MOUTH TWICE DAILY AS NEEDED FOR ANXIETY   . anastrozole (ARIMIDEX) 1 MG tablet Take 1 tablet (1 mg total) by mouth daily.   Marland Kitchen aspirin EC 81 MG tablet Take 81 mg by mouth daily.   . calcium carbonate (TUMS - DOSED IN MG ELEMENTAL CALCIUM) 500 MG chewable tablet Chew 2 tablets (400 mg of elemental calcium total) by mouth 3 (three) times daily.   . Cholecalciferol (VITAMIN D3 PO) Take 1 tablet by mouth daily.   Marland Kitchen escitalopram (LEXAPRO) 5 MG tablet    . hydrochlorothiazide (HYDRODIURIL) 25 MG tablet Take 0.5 tablets (12.5 mg total) by mouth daily.   Marland Kitchen HYDROcodone-acetaminophen (NORCO/VICODIN) 5-325 MG tablet Take 1-2 tablets by mouth every 4 (four) hours as needed for moderate pain or severe pain.   Marland Kitchen ketoconazole (NIZORAL) 2 % cream APPLY TOPICALLY DAILY AS NEEDED FOR IRRITATION   . levothyroxine (SYNTHROID, LEVOTHROID) 112 MCG tablet    . pantoprazole (PROTONIX) 40 MG  tablet Take 1 tablet (40 mg total) by mouth daily.   . potassium chloride SA (K-DUR,KLOR-CON) 20 MEQ tablet Take 20 mEq by mouth every 30 (thirty) days. 02/20/2017: Eats bananas, potassium tablets make her chest hurt and she doesn't like to take them   . [DISCONTINUED] levothyroxine (SYNTHROID, LEVOTHROID) 100 MCG tablet Take 1 tablet (100 mcg total) by mouth daily before breakfast. (Patient not taking: Reported on 05/27/2017)    No facility-administered encounter medications on file as of 05/27/2017.      ONCOLOGIC FAMILY HISTORY:  Family History  Problem Relation Age of Onset  . Hypertension Mother   . Heart disease Mother        Pacemaker  . Kidney disease Mother   . Cataracts Mother   . Hearing loss Mother   . Diabetes Mother   . Liver disease Mother   . Diabetes Sister   . Cataracts Brother   . Hearing loss Brother   . Colon cancer Neg Hx   .  Thyroid disease Neg Hx      GENETIC COUNSELING/TESTING: Not at this time  SOCIAL HISTORY:  ARBIE REISZ is single and lives with her daughter in [place], [place].  She has one daughter.  Sheila Jacobs is currently retired, but does keep her grand children from time to time.  She denies any current or history of tobacco, alcohol, or illicit drug use.     PHYSICAL EXAMINATION:  Vital Signs:   Vitals:   05/27/17 1015  BP: (!) 151/65  Pulse: 98  Resp: 18  Temp: 97.9 F (36.6 C)  SpO2: 98%   Filed Weights   05/27/17 1015  Weight: (!) 303 lb 4.8 oz (137.6 kg)   General: Well-nourished, well-appearing female in no acute distress.  She is unaccompanied today.   HEENT: Head is normocephalic.  Pupils equal and reactive to light. Conjunctivae clear without exudate.  Sclerae anicteric. Oral mucosa is pink, moist.  Oropharynx is pink without lesions or erythema.  Lymph: No cervical, supraclavicular, or infraclavicular lymphadenopathy noted on palpation.  Cardiovascular: Regular rate and rhythm.Marland Kitchen Respiratory: Clear to  auscultation bilaterally. Chest expansion symmetric; breathing non-labored.  Breasts: right breast is very large and pendulous, no lumps, masses, skin or nipple changes, left breast very large and pendulous, s/p lumcpectomy, scar tissue is healing well, no nodules, masses noted. GI: Abdomen soft and round; non-tender, non-distended. Bowel sounds normoactive.  GU: Deferred.  Neuro: No focal deficits. Steady gait.  Psych: Mood and affect normal and appropriate for situation.  Extremities: No edema. MSK: No focal spinal tenderness to palpation.  Full range of motion in bilateral upper extremities Skin: Warm and dry.  LABORATORY DATA:  None for this visit.  DIAGNOSTIC IMAGING:  None for this visit.      ASSESSMENT AND PLAN:  Ms.. Jacobs is a pleasant 66 y.o. female with Stage IA left breast invasive ductal carcinoma, ER+/PR+/HER2-, diagnosed in 01/2016, treated with lumpectomy, adjuvant radiation therapy, and anti-estrogen therapy with Anastrozole beginning in 08/2016.  She presents to the Survivorship Clinic for our initial meeting and routine follow-up post-completion of treatment for breast cancer.    1. Stage IA left breast cancer:  Sheila Jacobs is continuing to recover from definitive treatment for breast cancer. She will follow-up with her medical oncologist, Dr. Lindi Adie in 11/2017 with history and physical exam per surveillance protocol.  She will continue her anti-estrogen therapy with Anastrozole. Thus far, she is tolerating the Anastrozole well, with minimal side effects. She was instructed to make Dr. Lindi Adie or myself aware if she begins to experience any worsening side effects of the medication and I could see her back in clinic to help manage those side effects, as needed. Today, a comprehensive survivorship care plan and treatment summary was reviewed with the patient today detailing her breast cancer diagnosis, treatment course, potential late/long-term effects of treatment,  appropriate follow-up care with recommendations for the future, and patient education resources.  A copy of this summary, along with a letter will be sent to the patient's primary care provider via mail/fax/In Basket message after today's visit.    2. Morbid Obesity: Sheila Jacobs is mordibly obese.  She and I reviewed this in detail and that her weight is not good for her joints, lymphedema risk, breast cancer risk, as well as developing and having difficulty with managing other comorbidities such as hypertension and diabetes.  I encouraged her to increase her exercise, and also to eat healthier.  I reviewed with her resources including our healthy cooking  class, highlighted our nutrition rainbow, encouraged participation in our free yoga and tai chi clases and implored her take control of her health, to help reduce her health risks related to her weight. She tells me she is going to work on getting back into an exercise regimen, and also is going to look for  A part time job so she isn't sitting down watching TV and eating snacks so frequently.    3. Transportation difficulty and missed appointments: She has not been able to come to any of her several scheduled appointments this year due to transportation issues.  I have reached out to Johnnye Lana, our social worker for her to look into this further.    4. Bone health:  Given Sheila Jacobs's age/history of breast cancer and her current treatment regimen including anti-estrogen therapy with Anastrozole, she is at risk for bone demineralization.  She says she underwent a bone density test, however I cannot locate any records of this in her medical record.  She says it was done with Dr. Ardeth Perfect.  I asked her to have it sent to [place], so we can have a baseline as anastrozole can increase bone loss.  In the meantime, she was encouraged to increase her consumption of foods rich in calcium, as well as increase her weight-bearing activities.  She was given education on  specific activities to promote bone health.  5. Cancer screening:  Due to Sheila Jacobs's history and her age, she should receive screening for skin cancers, colon cancer, and gynecologic cancers.  The information and recommendations are listed on the patient's comprehensive care plan/treatment summary and were reviewed in detail with the patient.    6. Health maintenance and wellness promotion: Sheila Jacobs was encouraged to consume 5-7 servings of fruits and vegetables per day. We reviewed the "Nutrition Rainbow" handout, as well as the handout "Take Control of Your Health and Reduce Your Cancer Risk" from the [facility].  She was also encouraged to engage in moderate to vigorous exercise for 30 minutes per day most days of the week. We discussed the [employer] fitness program, which is designed for cancer survivors to help them become more physically fit after cancer treatments.  She was instructed to limit her alcohol consumption and continue to abstain from tobacco use.     7. Support services/counseling: It is not uncommon for this period of the patient's cancer care trajectory to be one of many emotions and stressors.  We discussed an opportunity for her to participate in the next session of [facility] ("Finding Your New Normal") support group series designed for patients after they have completed treatment.   Sheila Jacobs was encouraged to take advantage of our many other support services programs, support groups, and/or counseling in coping with her new life as a cancer survivor after completing anti-cancer treatment.  She was offered support today through active listening and expressive supportive counseling.  She was given information regarding our available services and encouraged to contact me with any questions or for help enrolling in any of our support group/programs.    Dispo:   -Return to cancer center in 6 months for follow up with Dr. Lindi Adie  -Mammogram is overdue.  Ordered  today to be scheduled ASAP -Follow up with Dr. Excell Seltzer in 1-2 months -She is welcome to return back to the Survivorship Clinic at any time; no additional follow-up needed at this time.  -Consider referral back to survivorship as a long-term survivor for continued surveillance  A total of (50) minutes of face-to-face time was spent with this patient with greater than 50% of that time in counseling and care-coordination.   Gardenia Phlegm, NP Survivorship Program [facility] [phone]   Note: PRIMARY CARE PROVIDER Velna Hatchet, [place] [phone]

## 2017-05-27 NOTE — Telephone Encounter (Signed)
Printed avs and calender for appointmentin March. Per 9/14 los

## 2017-05-27 NOTE — Telephone Encounter (Signed)
Spoke with patient granddaughter concerning upcoming appointment for 9/19 at GI. Per 9/14 los

## 2017-05-28 ENCOUNTER — Encounter: Payer: Self-pay | Admitting: *Deleted

## 2017-05-28 NOTE — Progress Notes (Signed)
[facility] Clinical Social Worker  Clinical Social Worker received referral for transportation needs.  CSW contacted patient to offer support and assess for needs.  Patient stated she uses the bus for transportation, and has received bus passes in the past while she was in active treatment.  CSW informed patient that she could receive a bus pass for her next appointment on 11/25/17.  Patient verbalized understanding and plans to contact CSW.    Johnnye Lana, MSW, LCSW, OSW-C Clinical Social Worker [facility] [phone]

## 2017-05-29 ENCOUNTER — Ambulatory Visit
Admission: RE | Admit: 2017-05-29 | Discharge: 2017-05-29 | Disposition: A | Discharge status: Home / Self Care | Payer: Medicare Other | Source: Ambulatory Visit | Attending: Adult Health | Admitting: Adult Health

## 2017-05-29 DIAGNOSIS — Z170 Estrogen receptor positive status [ER+]: Principal | ICD-10-CM

## 2017-05-29 DIAGNOSIS — C50412 Malignant neoplasm of upper-outer quadrant of left female breast: Secondary | ICD-10-CM

## 2017-05-29 DIAGNOSIS — R928 Other abnormal and inconclusive findings on diagnostic imaging of breast: Secondary | ICD-10-CM | POA: Diagnosis not present

## 2017-05-29 IMAGING — MG 2D DIGITAL DIAGNOSTIC BILATERAL MAMMOGRAM WITH CAD AND ADJUNCT T
8 of 22 series · 8 of 40 positions shown · non-contrast
Comparison: Previous exam(s).

CLINICAL DATA: Status post left lumpectomy for breast carcinoma in
[[place]].No current complaints.

EXAM:
2D DIGITAL DIAGNOSTIC BILATERAL MAMMOGRAM WITH CAD AND ADJUNCT TOMO

[L CC (1 of 2)]
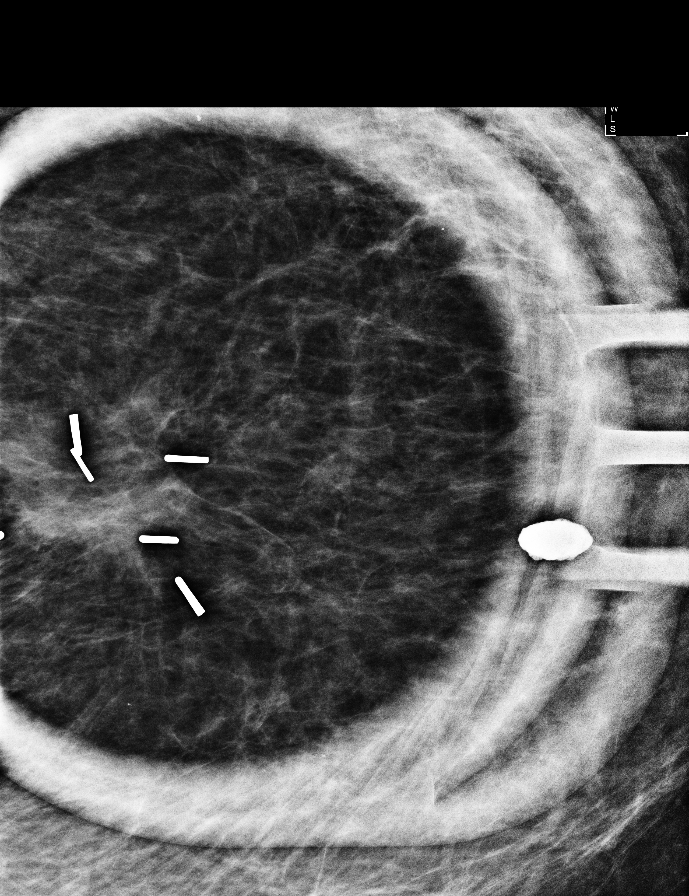

[R MLO (1 of 2)]
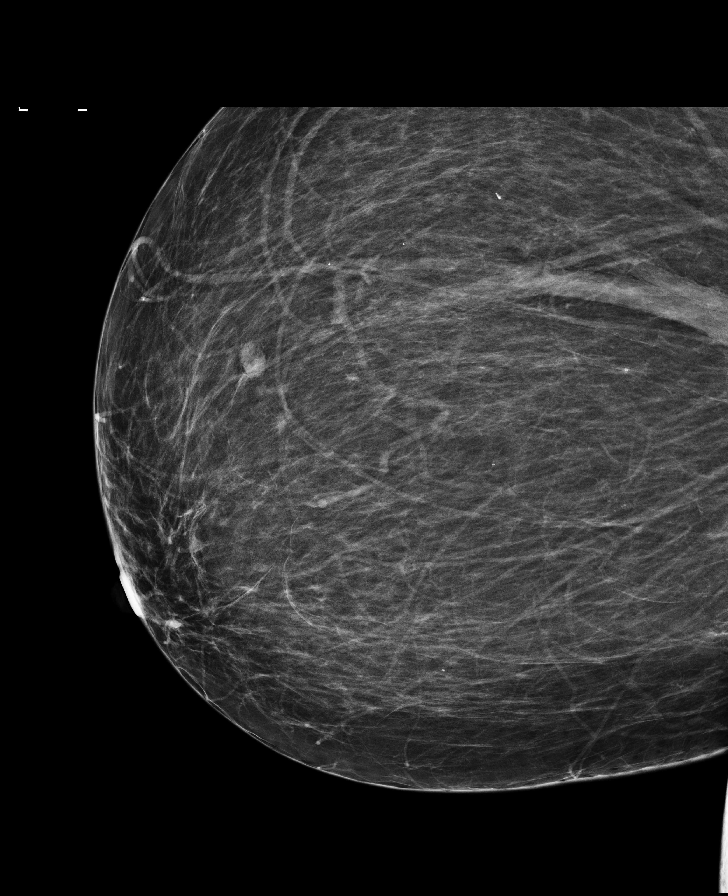

[L MLO (1 of 2)]
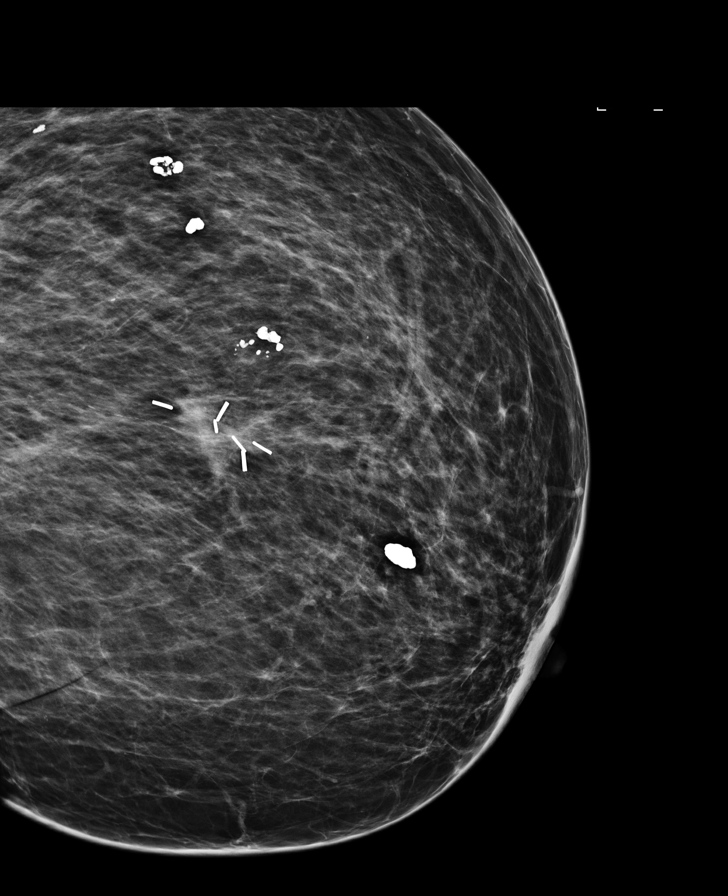

[L MLO (2 of 2)]
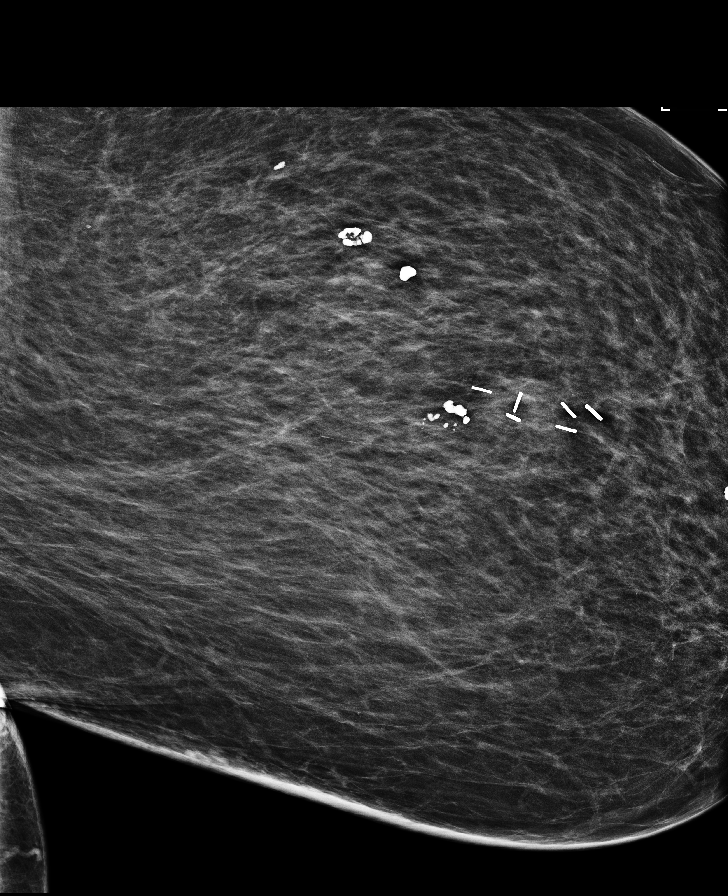

[L CC (2 of 2)]
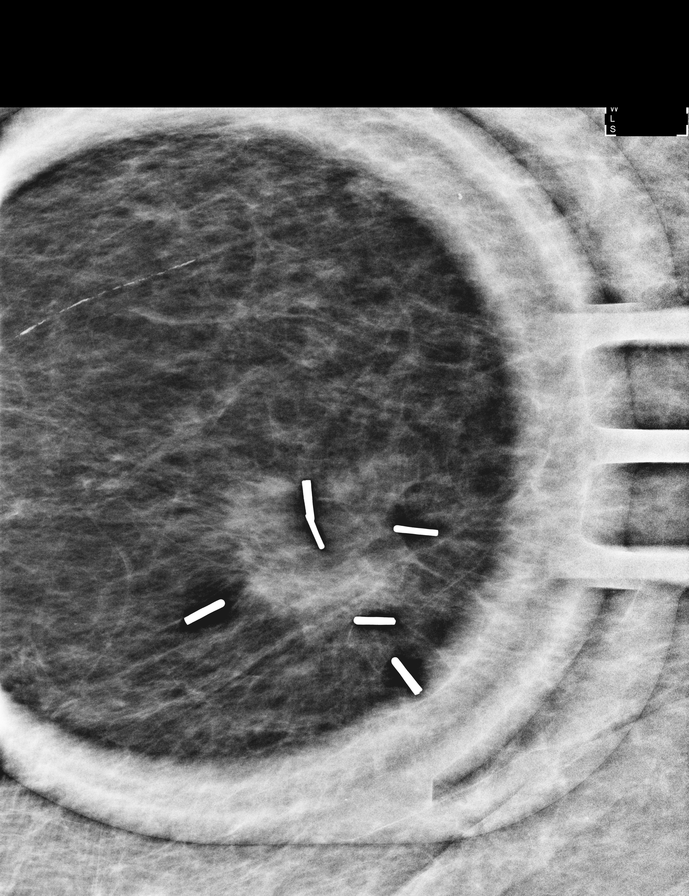

[L CV]
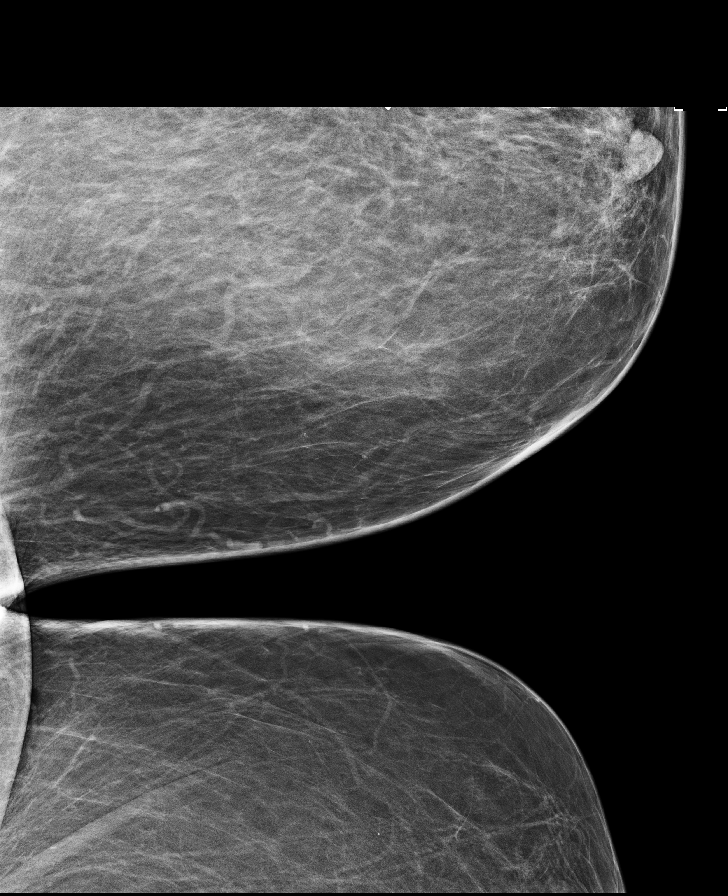

[R MLO (2 of 2)]
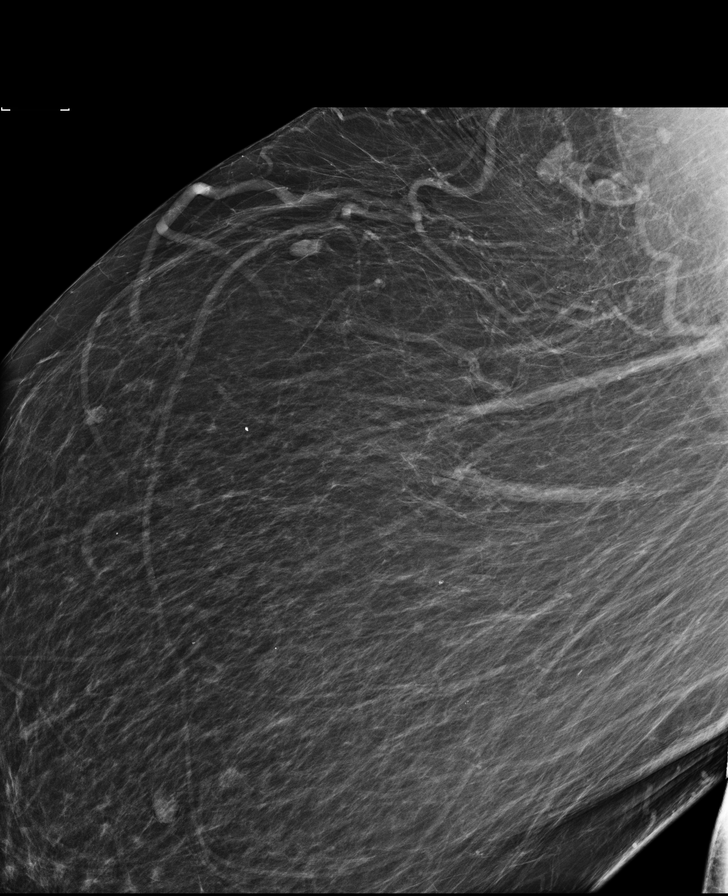

[L CC synth-2D]
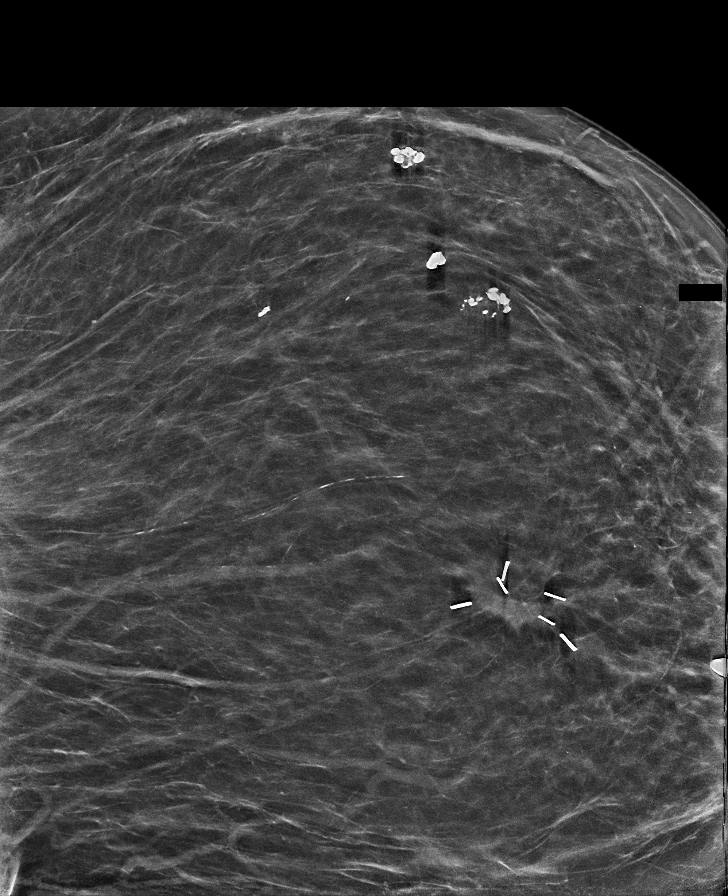

[8 of 40 positions shown; findings below may reference images not displayed]

ACR Breast Density Category b: There are scattered areas of
fibroglandular density.
FINDINGS: There is focal density with architectural distortion, associated
with surgical vascular clips, in the central left breast reflecting
the post lumpectomy scarring.

There are no new or suspicious masses, no other areas of
architectural distortion, no areas of significant asymmetry and no
suspicious calcifications.

Mammographic images were processed with CAD.
IMPRESSION: 1. No evidence of recurrent or new breast malignancy.
2. Benign postsurgical changes on the left.

RECOMMENDATION:
Diagnostic mammography in 1 year per standard post lumpectomy
protocol.

I have discussed the findings and recommendations with the patient.
Results were also provided in writing at the conclusion of the
visit. If applicable, a reminder letter will be sent to the patient
regarding the next appointment.

BI-RADS CATEGORY  2: Benign.

## 2017-05-31 NOTE — Progress Notes (Signed)
done

## 2017-06-04 ENCOUNTER — Other Ambulatory Visit: Payer: Self-pay | Admitting: Family Medicine

## 2017-06-04 ENCOUNTER — Other Ambulatory Visit: Payer: Self-pay | Admitting: Hematology and Oncology

## 2017-06-04 NOTE — Telephone Encounter (Signed)
Can she have a refill on this 

## 2017-06-16 ENCOUNTER — Other Ambulatory Visit: Payer: Self-pay | Admitting: Podiatry

## 2017-06-18 DIAGNOSIS — E038 Other specified hypothyroidism: Secondary | ICD-10-CM | POA: Diagnosis not present

## 2017-06-24 ENCOUNTER — Other Ambulatory Visit: Payer: Self-pay | Admitting: Podiatry

## 2017-06-26 NOTE — Progress Notes (Deleted)
Patient ID: Sheila Jacobs, female   DOB: 05/28/1951, 66 y.o.   MRN: [id number]           Reason for Appointment: Goiter, follow-up    History of Present Illness:   The patient's thyroid enlargement was first discovered in 2009 probably on her routine physical exam  [facility]   She however does not know when her goiter first started    On her initial evaluation she did have needle biopsy of her large right-sided nodule which was benign   She has never been on thyroid supplements Clinically she was felt to have a colloid goiter, mostly right sided  She is here now for follow-up, last seen in September More recently she thinks she is getting symptoms of something similar to a choking feeling which occurs on and off but mostly daily This is also associated with coughing which has been going on more recently She thinks that sometimes this is associated with the feeling of a thump in her chest She does not have any difficulty swallowing   She has had mild decrease in TSH without increased free T4 and this is now back to normal   Lab Results  Component Value Date   FREET4 1.02 12/10/2016   FREET4 1.2 05/09/2016   FREET4 1.2 01/16/2016   TSH 14.56 (A) 04/05/2017   TSH 0.55 12/10/2016   TSH 0.25 (L) 05/09/2016    She has had an ultrasound exam in  2009  Thyroid biopsy in 2009  Showed nonneoplastic goiter    Allergies as of 06/27/2017      Reactions   Cortisone Rash   Effexor [venlafaxine] Palpitations   Lexapro [escitalopram] Palpitations   Heart racing      Medication List       Accurate as of 06/26/17  9:25 PM. Always use your most recent med list.          ALPRAZolam 0.25 MG tablet Commonly known as:  XANAX TAKE ONE TABLET BY MOUTH TWICE DAILY AS NEEDED FOR ANXIETY   anastrozole 1 MG tablet Commonly known as:  ARIMIDEX TAKE 1 TABLET BY MOUTH ONCE DAILY   aspirin EC 81 MG tablet Take 81 mg by mouth daily.     calcium carbonate 500 MG chewable tablet Commonly known as:  TUMS - dosed in mg elemental calcium Chew 2 tablets (400 mg of elemental calcium total) by mouth 3 (three) times daily.   escitalopram 5 MG tablet Commonly known as:  LEXAPRO   hydrochlorothiazide 25 MG tablet Commonly known as:  HYDRODIURIL Take 0.5 tablets (12.5 mg total) by mouth daily.   HYDROcodone-acetaminophen 5-325 MG tablet Commonly known as:  NORCO/VICODIN Take 1-2 tablets by mouth every 4 (four) hours as needed for moderate pain or severe pain.   ketoconazole 2 % cream Commonly known as:  NIZORAL APPLY TOPICALLY DAILY AS NEEDED FOR IRRITATION   levothyroxine 112 MCG tablet Commonly known as:  SYNTHROID, LEVOTHROID   pantoprazole 40 MG tablet Commonly known as:  PROTONIX Take 1 tablet (40 mg total) by mouth daily.   potassium chloride SA 20 MEQ tablet Commonly known as:  K-DUR,KLOR-CON Take 20 mEq by mouth every 30 (thirty) days.   VITAMIN D3 PO Take 1 tablet by mouth daily.       Allergies:  Allergies  Allergen Reactions  . Cortisone Rash  . Effexor [Venlafaxine] Palpitations  . Lexapro [Escitalopram] Palpitations    Heart racing  Past Medical History:  Diagnosis Date  . Anginal pain ([place])   . Anxiety   . Arthritis   . Breast cancer ([place])   . Breast cancer of upper-outer quadrant of left female breast ([place]) 01/31/2016  . Colon polyps   . Enlarged LA (left atrium)   . GERD (gastroesophageal reflux disease)   . Hard of hearing    Wears hearing aids.  . Headache   . Heavy cigarette smoker    Quit 2015  . History of radiation therapy 06/18/16- 07/30/16   Left Breast 45 Gy in 25 fractions, Left Breast Boost 6 Gy in 3 fractions.   . Hyperlipidemia   . Hypertension   . Morbid obesity ([place])   . Numbness and tingling   . Obesity   . Thyroid goiter   . Vertigo    on occasion    Past Surgical History:  Procedure Laterality Date  . BREAST LUMPECTOMY Left 2017   radiation  . BREAST  LUMPECTOMY WITH RADIOACTIVE SEED AND SENTINEL LYMPH NODE BIOPSY Left 04/11/2016   Procedure: BREAST LUMPECTOMY WITH RADIOACTIVE SEED AND SENTINEL LYMPH NODE BIOPSY;  Surgeon: Excell Seltzer, MD;  Location: [facility];  Service: General;  Laterality: Left;  . COLONOSCOPY WITH PROPOFOL N/A 10/20/2015   Procedure: COLONOSCOPY WITH PROPOFOL;  Surgeon: Mauri Pole, MD;  [facility];  Service: Endoscopy;  Laterality: N/A;  . ESOPHAGOGASTRODUODENOSCOPY (EGD) WITH PROPOFOL N/A 10/20/2015   Procedure: ESOPHAGOGASTRODUODENOSCOPY (EGD) WITH PROPOFOL;  Surgeon: Mauri Pole, MD;  [facility];  Service: Endoscopy;  Laterality: N/A;  . LEFT HEART CATHETERIZATION WITH CORONARY ANGIOGRAM N/A 10/21/2014   Procedure: LEFT HEART CATHETERIZATION WITH CORONARY ANGIOGRAM;  Surgeon: Burnell Blanks, MD;  Location: [facility] CATH LAB;  Service: Cardiovascular;  Laterality: N/A;  . THYROIDECTOMY N/A 02/26/2017   Procedure: TOTAL THYROIDECTOMY;  Surgeon: Excell Seltzer, MD;  [facility];  Service: General;  Laterality: N/A;  . TUBAL LIGATION      Family History  Problem Relation Age of Onset  . Hypertension Mother   . Heart disease Mother        Pacemaker  . Kidney disease Mother   . Cataracts Mother   . Hearing loss Mother   . Diabetes Mother   . Liver disease Mother   . Diabetes Sister   . Cataracts Brother   . Hearing loss Brother   . Colon cancer Neg Hx   . Thyroid disease Neg Hx     Social History:  reports that she quit smoking about 2 years ago. Her smoking use included Cigarettes. She has a 45.00 pack-year smoking history. She has never used smokeless tobacco. She reports that she does not drink alcohol or use drugs.    Review of Systems  She apparently has been given Protonix for reflux, does not complain of heartburn She says that Protonix causes a headache and does not take it regularly   Examination:   There were no vitals taken for this visit.            The thyroid is enlarged Significantly on the right side, about 4-5 times normal, smooth, relatively soft. Left-sided minimally enlarged Neck circumference is 43.5 cm over the thyroid, has some tracheal deviation to the left There is no stridor. Pemberton sign is negative  There is no lymphadenopathy in the neck .     REFLEXES: at biceps are normal.    Assessment/Plan:   Long-standing goiter with mostly right Lobe enlargement She has had a  large soft goiter but by measurement it appears to be increased in size She is having some nonspecific symptoms that sound like she is having difficulty breathing at times but she is also having cough at bedtime She thinks she is getting some skipping of her heart but apparently has had some cardiac evaluation which was negative  Although previously her TSH was slightly low at is back to normal now  Patient also has had some cough but she does not think this is better with taking her reflux medication  Recommendations: Discussed that if she is having tracheal compression from her goiter she will potentially need surgery and will need to do a CT scan to evaluate this She is somewhat reluctant to consider this now She was encouraged to follow-up with her primary care physician to evaluate her cough Since she reports some intolerance to Protonix she may be able to use a different PPI   Mitzie Marlar 06/26/2017

## 2017-06-27 ENCOUNTER — Ambulatory Visit: Payer: Medicare Other | Admitting: Endocrinology

## 2017-06-27 ENCOUNTER — Encounter: Payer: Self-pay | Admitting: Endocrinology

## 2017-06-27 ENCOUNTER — Ambulatory Visit (INDEPENDENT_AMBULATORY_CARE_PROVIDER_SITE_OTHER): Payer: Medicare Other | Admitting: Endocrinology

## 2017-06-27 VITALS — BP 120/64 | HR 108 | Ht 63.0 in | Wt 308.0 lb

## 2017-06-27 DIAGNOSIS — E890 Postprocedural hypothyroidism: Secondary | ICD-10-CM

## 2017-06-27 MED ORDER — LEVOTHYROXINE SODIUM 137 MCG PO TABS: 137.0000 ug | ORAL_TABLET | Freq: Every day | ORAL | 2 refills | Status: DC

## 2017-06-27 NOTE — Progress Notes (Signed)
Patient ID: Sheila Jacobs, female   DOB: [birth year], 66 y.o.   MRN: [id number]           Reason for Appointment: Goiter, follow-up    History of Present Illness:   The patient's thyroid enlargement was first discovered in 2009 probably on her routine physical exam  [facility]   She however does not know when her goiter first started    On her initial evaluation she did have needle biopsy of her large right-sided nodule which was benign  Clinically she was felt to have a colloid goiter, mostly right sided  Because of her local pressure symptoms with choking feeling, coughing she was referred for thyroidectomy which was done in June 2018  She is here now for follow-up Since her surgery she has not had any choking or coughing and feels better with local pressure symptoms  She has been on 112 g levothyroxine, not clear when this was started  More recently she thinks she is getting some dizziness but does not feel unusually tired She has occasional chills She said that her thyroid medication is causing her to have chest pain but she had already talked to her PCP about this, she does have a history of atypical chest pain in the past  Her PCP did her labs on 06/18/17 and her TSH was 14.7 She has difficulty losing weight She does not have any difficulty swallowing   She has had mild decrease in TSH without increased free T4 and this is now back to normal   Lab Results  Component Value Date   FREET4 1.02 12/10/2016   FREET4 1.2 05/09/2016   FREET4 1.2 01/16/2016   TSH 14.56 (A) 04/05/2017   TSH 0.55 12/10/2016   TSH 0.25 (L) 05/09/2016       Allergies as of 06/27/2017      Reactions   Cortisone Rash   Effexor [venlafaxine] Palpitations   Lexapro [escitalopram] Palpitations   Heart racing      Medication List       Accurate as of 06/27/17  1:43 PM. Always use your most recent med list.          ALPRAZolam 0.25 MG  tablet Commonly known as:  XANAX TAKE ONE TABLET BY MOUTH TWICE DAILY AS NEEDED FOR ANXIETY   anastrozole 1 MG tablet Commonly known as:  ARIMIDEX TAKE 1 TABLET BY MOUTH ONCE DAILY   aspirin EC 81 MG tablet Take 81 mg by mouth daily.   calcium carbonate 500 MG chewable tablet Commonly known as:  TUMS - dosed in mg elemental calcium Chew 2 tablets (400 mg of elemental calcium total) by mouth 3 (three) times daily.   escitalopram 5 MG tablet Commonly known as:  LEXAPRO   hydrochlorothiazide 25 MG tablet Commonly known as:  HYDRODIURIL Take 0.5 tablets (12.5 mg total) by mouth daily.   HYDROcodone-acetaminophen 5-325 MG tablet Commonly known as:  NORCO/VICODIN Take 1-2 tablets by mouth every 4 (four) hours as needed for moderate pain or severe pain.   ketoconazole 2 % cream Commonly known as:  NIZORAL APPLY TOPICALLY DAILY AS NEEDED FOR IRRITATION   levothyroxine 112 MCG tablet Commonly known as:  SYNTHROID, LEVOTHROID   pantoprazole 40 MG tablet Commonly known as:  PROTONIX Take 1 tablet (40 mg total) by mouth daily.   potassium chloride SA 20 MEQ tablet Commonly known as:  K-DUR,KLOR-CON Take 20 mEq by mouth every 30 (thirty) days.  VITAMIN D3 PO Take 1 tablet by mouth daily.       Allergies:  Allergies  Allergen Reactions  . Cortisone Rash  . Effexor [Venlafaxine] Palpitations  . Lexapro [Escitalopram] Palpitations    Heart racing    Past Medical History:  Diagnosis Date  . Anginal pain ([place])   . Anxiety   . Arthritis   . Breast cancer ([place])   . Breast cancer of upper-outer quadrant of left female breast ([place]) 01/31/2016  . Colon polyps   . Enlarged LA (left atrium)   . GERD (gastroesophageal reflux disease)   . Hard of hearing    Wears hearing aids.  . Headache   . Heavy cigarette smoker    Quit 2015  . History of radiation therapy 06/18/16- 07/30/16   Left Breast 45 Gy in 25 fractions, Left Breast Boost 6 Gy in 3 fractions.   . Hyperlipidemia    . Hypertension   . Morbid obesity ([place])   . Numbness and tingling   . Obesity   . Thyroid goiter   . Vertigo    on occasion    Past Surgical History:  Procedure Laterality Date  . BREAST LUMPECTOMY Left 2017   radiation  . BREAST LUMPECTOMY WITH RADIOACTIVE SEED AND SENTINEL LYMPH NODE BIOPSY Left 04/11/2016   Procedure: BREAST LUMPECTOMY WITH RADIOACTIVE SEED AND SENTINEL LYMPH NODE BIOPSY;  Surgeon: Excell Seltzer, MD;  Location: [facility];  Service: General;  Laterality: Left;  . COLONOSCOPY WITH PROPOFOL N/A 10/20/2015   Procedure: COLONOSCOPY WITH PROPOFOL;  Surgeon: Mauri Pole, MD;  Location: [facility];  Service: Endoscopy;  Laterality: N/A;  . ESOPHAGOGASTRODUODENOSCOPY (EGD) WITH PROPOFOL N/A 10/20/2015   Procedure: ESOPHAGOGASTRODUODENOSCOPY (EGD) WITH PROPOFOL;  Surgeon: Mauri Pole, MD;  Location: [facility];  Service: Endoscopy;  Laterality: N/A;  . LEFT HEART CATHETERIZATION WITH CORONARY ANGIOGRAM N/A 10/21/2014   Procedure: LEFT HEART CATHETERIZATION WITH CORONARY ANGIOGRAM;  Surgeon: Burnell Blanks, MD;  Location: [facility] CATH LAB;  Service: Cardiovascular;  Laterality: N/A;  . THYROIDECTOMY N/A 02/26/2017   Procedure: TOTAL THYROIDECTOMY;  Surgeon: Excell Seltzer, MD;  Location: [facility];  Service: General;  Laterality: N/A;  . TUBAL LIGATION      Family History  Problem Relation Age of Onset  . Hypertension Mother   . Heart disease Mother        Pacemaker  . Kidney disease Mother   . Cataracts Mother   . Hearing loss Mother   . Diabetes Mother   . Liver disease Mother   . Diabetes Sister   . Cataracts Brother   . Hearing loss Brother   . Colon cancer Neg Hx   . Thyroid disease Neg Hx     Social History:  reports that she quit smoking about 2 years ago. Her smoking use included Cigarettes. She has a 45.00 pack-year smoking history. She has never used smokeless tobacco. She reports that she does not drink alcohol or use drugs.    Review  of Systems  Sometimes has headaches  She says that sometimes she has a gurgling sound in her upper chest area but no pain with this   Examination:   BP 120/64   Pulse (!) 108   Ht 5\' 3"  (1.6 m)   Wt (!) 308 lb (139.7 kg)   SpO2 95%   BMI 54.56 kg/m           No mass felt in the thyroid area There is no lymphadenopathy in the neck .  REFLEXES: at biceps are normal.  Skin appears normal, hands are cool   Assessment/Plan:  Postsurgical hypothyroidism  Considering her weight of 308 pounds she is only taking 112 g levothyroxine Her TSH has been about 14 She has some nonspecific symptoms of dizziness, headache and chest discomfort but no clear-cut fatigue or cold intolerance Her exam is unremarkable today  Recommendations: She will increase her dose to the 137 g dose instead of 112 Explained to her that the thyroid supplement is to replace what she normally gets from her normal thyroid gland and discuss functions of the thyroid gland and healthy individual  She will also need to follow up with her PCP for any nonspecific symptoms   Terran Klinke 06/27/2017

## 2017-08-08 ENCOUNTER — Telehealth: Payer: Self-pay | Admitting: Endocrinology

## 2017-08-08 NOTE — Telephone Encounter (Signed)
Please advise 

## 2017-08-08 NOTE — Telephone Encounter (Signed)
She can try skipping 1 tablet once a week.  However needs to be seen as soon as possible with labs

## 2017-08-08 NOTE — Telephone Encounter (Signed)
Pt states his feet and hands hurt and his heart beat is irregular since he started LEVOTHYROXINE in Oct 2018. Please call pt to advise.

## 2017-08-09 ENCOUNTER — Telehealth: Payer: Self-pay

## 2017-08-09 NOTE — Telephone Encounter (Signed)
Left vm advising about the note below and requested she call back is she has any further questions

## 2017-08-09 NOTE — Telephone Encounter (Signed)
Patient called back and I explained the directions below and requested she call and schedule her labs and office visit

## 2017-08-13 ENCOUNTER — Other Ambulatory Visit (INDEPENDENT_AMBULATORY_CARE_PROVIDER_SITE_OTHER): Payer: Medicare Other

## 2017-08-13 DIAGNOSIS — E890 Postprocedural hypothyroidism: Secondary | ICD-10-CM

## 2017-08-13 LAB — TSH: TSH: 21.52 u[IU]/mL — ABNORMAL HIGH (ref 0.35–4.50)

## 2017-08-13 LAB — T4, FREE: Free T4: 0.73 ng/dL (ref 0.60–1.60)

## 2017-08-22 NOTE — Telephone Encounter (Signed)
Patient is returning call.  °

## 2017-08-29 ENCOUNTER — Ambulatory Visit (INDEPENDENT_AMBULATORY_CARE_PROVIDER_SITE_OTHER): Payer: Medicare Other | Admitting: Endocrinology

## 2017-08-29 ENCOUNTER — Encounter: Payer: Self-pay | Admitting: Endocrinology

## 2017-08-29 VITALS — BP 126/74 | HR 79 | Ht 63.0 in | Wt 313.2 lb

## 2017-08-29 DIAGNOSIS — E890 Postprocedural hypothyroidism: Secondary | ICD-10-CM

## 2017-08-29 DIAGNOSIS — I10 Essential (primary) hypertension: Secondary | ICD-10-CM | POA: Diagnosis not present

## 2017-08-29 LAB — BASIC METABOLIC PANEL
BUN: 9 mg/dL (ref 6–23)
CALCIUM: 9.4 mg/dL (ref 8.4–10.5)
CO2: 32 meq/L (ref 19–32)
Chloride: 103 mEq/L (ref 96–112)
Creatinine, Ser: 0.83 mg/dL (ref 0.40–1.20)
GFR: 87.72 mL/min (ref >60.00)
Glucose, Bld: 101 mg/dL — ABNORMAL HIGH (ref 70–99)
Potassium: 3.9 mEq/L (ref 3.5–5.1)
Sodium: 138 mEq/L (ref 135–145)

## 2017-08-29 LAB — TSH: TSH: 17.82 u[IU]/mL — AB (ref 0.35–4.50)

## 2017-08-29 MED ORDER — LEVOTHYROXINE SODIUM 150 MCG PO TABS: 150.0000 ug | ORAL_TABLET | Freq: Every day | ORAL | 3 refills | Status: DC

## 2017-08-29 NOTE — Progress Notes (Signed)
Patient ID: Sheila Jacobs, female   DOB: 05/18/1951, 66 y.o.   MRN: [id number]           Reason for Appointment: HYPOTHYROIDISM, follow-up    History of Present Illness:   The patient's thyroid enlargement was first discovered in 2009 probably on her routine physical exam  [facility]   She however does not know when her goiter first started    On her initial evaluation she did have needle biopsy of her large right-sided nodule which was benign  Clinically she was felt to have a colloid goiter, mostly right sided  Because of her local pressure symptoms with choking feeling, coughing she was referred for thyroidectomy which was done in June 2018 This had relieved her local pressure symptoms  RECENT history:  She appears to have required progressively higher doses of levothyroxine surgery However she has been reluctant to increase her dose because she claims that with starting the levothyroxine she would have headaches and joint pains as well as other atypical symptoms She also thinks that she has some local discomfort along with a unusual sensation in her neck at the site of surgery  She had an increase in her dose in 06/2017 and subsequently taking 137 g daily However she again thinks that taking her thyroid medication causes the above symptoms She does however since that she tends to get sleepy easily and also has some fatigue With increasing her dose up to 137 g however her cold intolerance was less She has gained weight again She does not have any difficulty swallowing  She thinks she is taking her thyroxine before her first meal around noon but probably takes her calcium tablets 30 minutes later  She is here now for follow-up since her labs were done on 08/13/17    Wt Readings from Last 3 Encounters:  08/29/17 (!) 313 lb 3.2 oz (142.1 kg)  06/27/17 (!) 308 lb (139.7 kg)  05/27/17 (!) 303 lb 4.8 oz (137.6 kg)      Lab  Results  Component Value Date   FREET4 0.73 08/13/2017   FREET4 1.02 12/10/2016   FREET4 1.2 05/09/2016   TSH 21.52 (H) 08/13/2017   TSH 14.56 (A) 04/05/2017   TSH 0.55 12/10/2016       Allergies as of 08/29/2017      Reactions   Cortisone Rash   Effexor [venlafaxine] Palpitations   Lexapro [escitalopram] Palpitations   Heart racing      Medication List        Accurate as of 08/29/17  2:34 PM. Always use your most recent med list.          ALPRAZolam 0.25 MG tablet Commonly known as:  XANAX TAKE ONE TABLET BY MOUTH TWICE DAILY AS NEEDED FOR ANXIETY   anastrozole 1 MG tablet Commonly known as:  ARIMIDEX TAKE 1 TABLET BY MOUTH ONCE DAILY   aspirin EC 81 MG tablet Take 81 mg by mouth daily.   calcium carbonate 500 MG chewable tablet Commonly known as:  TUMS - dosed in mg elemental calcium Chew 2 tablets (400 mg of elemental calcium total) by mouth 3 (three) times daily.   escitalopram 5 MG tablet Commonly known as:  LEXAPRO   hydrochlorothiazide 25 MG tablet Commonly known as:  HYDRODIURIL Take 0.5 tablets (12.5 mg total) by mouth daily.   HYDROcodone-acetaminophen 5-325 MG tablet Commonly known as:  NORCO/VICODIN Take 1-2 tablets by mouth every 4 (four)  hours as needed for moderate pain or severe pain.   ketoconazole 2 % cream Commonly known as:  NIZORAL APPLY TOPICALLY DAILY AS NEEDED FOR IRRITATION   levothyroxine 137 MCG tablet Commonly known as:  SYNTHROID Take 1 tablet (137 mcg total) by mouth daily before breakfast.   pantoprazole 40 MG tablet Commonly known as:  PROTONIX Take 1 tablet (40 mg total) by mouth daily.   potassium chloride SA 20 MEQ tablet Commonly known as:  K-DUR,KLOR-CON Take 20 mEq by mouth every 30 (thirty) days.   VITAMIN D3 PO Take 1 tablet by mouth daily.       Allergies:  Allergies  Allergen Reactions  . Cortisone Rash  . Effexor [Venlafaxine] Palpitations  . Lexapro [Escitalopram] Palpitations    Heart  racing    Past Medical History:  Diagnosis Date  . Anginal pain ([place])   . Anxiety   . Arthritis   . Breast cancer ([place])   . Breast cancer of upper-outer quadrant of left female breast ([place]) 01/31/2016  . Colon polyps   . Enlarged LA (left atrium)   . GERD (gastroesophageal reflux disease)   . Hard of hearing    Wears hearing aids.  . Headache   . Heavy cigarette smoker    Quit 2015  . History of radiation therapy 06/18/16- 07/30/16   Left Breast 45 Gy in 25 fractions, Left Breast Boost 6 Gy in 3 fractions.   . Hyperlipidemia   . Hypertension   . Morbid obesity ([place])   . Numbness and tingling   . Obesity   . Thyroid goiter   . Vertigo    on occasion    Past Surgical History:  Procedure Laterality Date  . BREAST LUMPECTOMY Left 2017   radiation  . BREAST LUMPECTOMY WITH RADIOACTIVE SEED AND SENTINEL LYMPH NODE BIOPSY Left 04/11/2016   Procedure: BREAST LUMPECTOMY WITH RADIOACTIVE SEED AND SENTINEL LYMPH NODE BIOPSY;  Surgeon: Excell Seltzer, MD;  Location: [facility];  Service: General;  Laterality: Left;  . COLONOSCOPY WITH PROPOFOL N/A 10/20/2015   Procedure: COLONOSCOPY WITH PROPOFOL;  Surgeon: Mauri Pole, MD;  Location: [facility];  Service: Endoscopy;  Laterality: N/A;  . ESOPHAGOGASTRODUODENOSCOPY (EGD) WITH PROPOFOL N/A 10/20/2015   Procedure: ESOPHAGOGASTRODUODENOSCOPY (EGD) WITH PROPOFOL;  Surgeon: Mauri Pole, MD;  Location: [facility];  Service: Endoscopy;  Laterality: N/A;  . LEFT HEART CATHETERIZATION WITH CORONARY ANGIOGRAM N/A 10/21/2014   Procedure: LEFT HEART CATHETERIZATION WITH CORONARY ANGIOGRAM;  Surgeon: Burnell Blanks, MD;  Location[facility];  Service: Cardiovascular;  Laterality: N/A;  . THYROIDECTOMY N/A 02/26/2017   Procedure: TOTAL THYROIDECTOMY;  Surgeon: Excell Seltzer, MD;  Location: [facility];  Service: General;  Laterality: N/A;  . TUBAL LIGATION      Family History  Problem Relation Age of Onset  . Hypertension Mother    . Heart disease Mother        Pacemaker  . Kidney disease Mother   . Cataracts Mother   . Hearing loss Mother   . Diabetes Mother   . Liver disease Mother   . Diabetes Sister   . Cataracts Brother   . Hearing loss Brother   . Colon cancer Neg Hx   . Thyroid disease Neg Hx     Social History:  reports that she quit smoking about 3 years ago. Her smoking use included cigarettes. She has a 45.00 pack-year smoking history. she has never used smokeless tobacco. She reports that she does not drink alcohol  or use drugs.    Review of Systems  Sometimes has headaches  She is asking about not taking multiple medications  Hypertension has been managed by PCP    Examination:   BP 126/74   Pulse 79   Ht 5\' 3"  (1.6 m)   Wt (!) 313 lb 3.2 oz (142.1 kg)   SpO2 97%   BMI 55.48 kg/m           She looks well .     REFLEXES: at biceps are normal.  Skin appears normal No peripheral edema  Assessment/Plan:  Postsurgical hypothyroidism  Patient has been significantly hypothyroid recently with her regimen of levothyroxine However she has been reluctant to increase her dose because she perceives that the thyroid supplement is causing side effects such as headaches and other nonspecific symptoms However she is still having issues with fatigue and sleepiness and weight gain  Explained to the patient that she is not having side effects of levothyroxine and she needs to discuss these with her PCP   Recommendations: She will take her calcium supplement at least a couple of hours after her thyroid supplement in the morning Will recheck her thyroid levels today to confirm that she needs a significant increase in dosage She agrees to take her thyroid supplement regularly before eating in the morning and adjust the dose as instructed   Elayne Snare 08/29/2017  Addendum: TSH is 17.8, slightly better  She will now take 150 g levothyroxine until her next visit and stop her calcium at  breakfast.   Lab Results  Component Value Date   TSH 17.82 (H) 08/29/2017

## 2017-08-30 NOTE — Telephone Encounter (Signed)
Patient came in the office yesterday

## 2017-09-06 ENCOUNTER — Telehealth: Payer: Self-pay | Admitting: Endocrinology

## 2017-09-06 NOTE — Telephone Encounter (Signed)
I have called the patient back and I have given her the lab results.

## 2017-09-06 NOTE — Telephone Encounter (Signed)
Pt returned call, stated it was before christmas.   Asked for a call back  THANKS!

## 2017-10-02 NOTE — Telephone Encounter (Signed)
Patient stated that since she has been taking the levothyroxine (SYNTHROID, LEVOTHROID) 150 MCG tablet [[id number]] she is having heart attack symtoms please advise Sheila Jacobs took the call.

## 2017-10-02 NOTE — Telephone Encounter (Signed)
I did suggest she go to the ED when I talked to her but she was insistent that it was the medication - I tried to call the patient and she did not answer so I left her a vm letting her know Dr. Dwyane Dee is suggesting she go to the ED

## 2017-10-02 NOTE — Telephone Encounter (Signed)
What symptoms that she having?  The thyroid medication will not cause a heart attack.  She needs to go to the emergency room now if she is having chest pain or call her primary care doctor for evaluation

## 2017-10-06 ENCOUNTER — Emergency Department (HOSPITAL_COMMUNITY)
Admission: EM | Admit: 2017-10-06 | Discharge: 2017-10-07 | Disposition: A | Discharge status: Home / Self Care | Payer: Medicare Other | Attending: Emergency Medicine | Admitting: Emergency Medicine

## 2017-10-06 ENCOUNTER — Emergency Department (HOSPITAL_COMMUNITY): Payer: Medicare Other

## 2017-10-06 ENCOUNTER — Encounter (HOSPITAL_COMMUNITY): Payer: Self-pay

## 2017-10-06 DIAGNOSIS — R079 Chest pain, unspecified: Secondary | ICD-10-CM | POA: Diagnosis present

## 2017-10-06 DIAGNOSIS — Z79899 Other long term (current) drug therapy: Secondary | ICD-10-CM | POA: Diagnosis not present

## 2017-10-06 DIAGNOSIS — R0602 Shortness of breath: Secondary | ICD-10-CM | POA: Diagnosis not present

## 2017-10-06 DIAGNOSIS — R072 Precordial pain: Secondary | ICD-10-CM | POA: Insufficient documentation

## 2017-10-06 DIAGNOSIS — Z87891 Personal history of nicotine dependence: Secondary | ICD-10-CM | POA: Insufficient documentation

## 2017-10-06 DIAGNOSIS — Z923 Personal history of irradiation: Secondary | ICD-10-CM | POA: Diagnosis not present

## 2017-10-06 DIAGNOSIS — Z853 Personal history of malignant neoplasm of breast: Secondary | ICD-10-CM | POA: Diagnosis not present

## 2017-10-06 DIAGNOSIS — R109 Unspecified abdominal pain: Secondary | ICD-10-CM | POA: Insufficient documentation

## 2017-10-06 DIAGNOSIS — Z7982 Long term (current) use of aspirin: Secondary | ICD-10-CM | POA: Insufficient documentation

## 2017-10-06 LAB — BASIC METABOLIC PANEL
ANION GAP: 12 (ref 5–15)
BUN: 9 mg/dL (ref 6–20)
CHLORIDE: 104 mmol/L (ref 101–111)
CO2: 23 mmol/L (ref 22–32)
Calcium: 9.5 mg/dL (ref 8.9–10.3)
Creatinine, Ser: 0.81 mg/dL (ref 0.44–1.00)
Glucose, Bld: 98 mg/dL (ref 65–99)
POTASSIUM: 3.5 mmol/L (ref 3.5–5.1)
SODIUM: 139 mmol/L (ref 135–145)

## 2017-10-06 LAB — CBC
HEMATOCRIT: 38.7 % (ref 36.0–46.0)
HEMOGLOBIN: 13.2 g/dL (ref 12.0–15.0)
MCH: 33.3 pg (ref 26.0–34.0)
MCHC: 34.1 g/dL (ref 30.0–36.0)
MCV: 97.7 fL (ref 78.0–100.0)
Platelets: 201 10*3/uL (ref 150–400)
RBC: 3.96 MIL/uL (ref 3.87–5.11)
RDW: 12.2 % (ref 11.5–15.5)
WBC: 3.5 10*3/uL — ABNORMAL LOW (ref 4.0–10.5)

## 2017-10-06 LAB — I-STAT TROPONIN, ED
TROPONIN I, POC: 0.01 ng/mL (ref 0.00–0.08)
Troponin i, poc: 0 ng/mL (ref 0.00–0.08)

## 2017-10-06 IMAGING — DX DG CHEST 2V
2 series · 2 of 2 positions shown · non-contrast
Comparison: [DATE]

CLINICAL DATA: Intermittent sharp chest pain and dyspnea.

EXAM:
CHEST  2 VIEW

[chest lat]
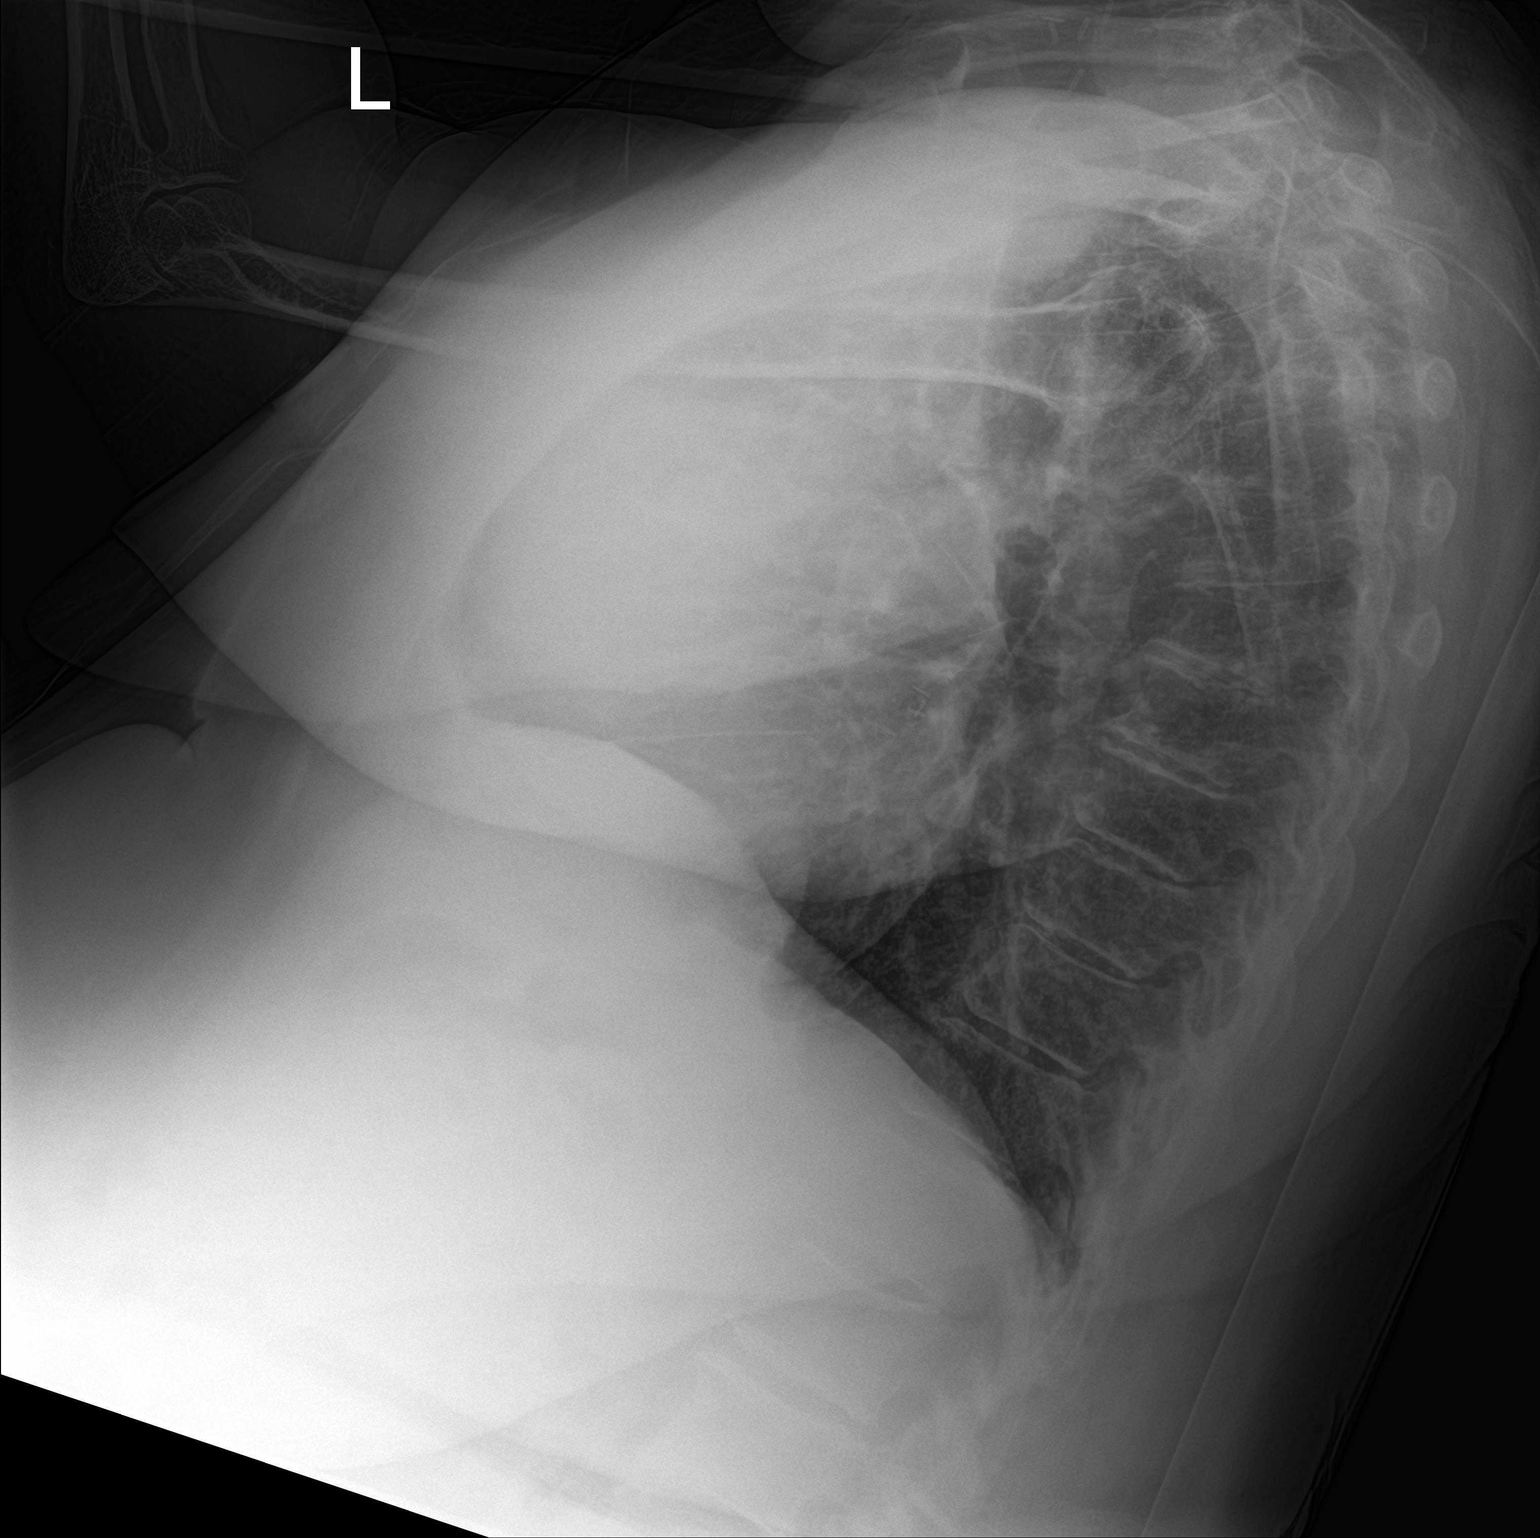

[chest ap]
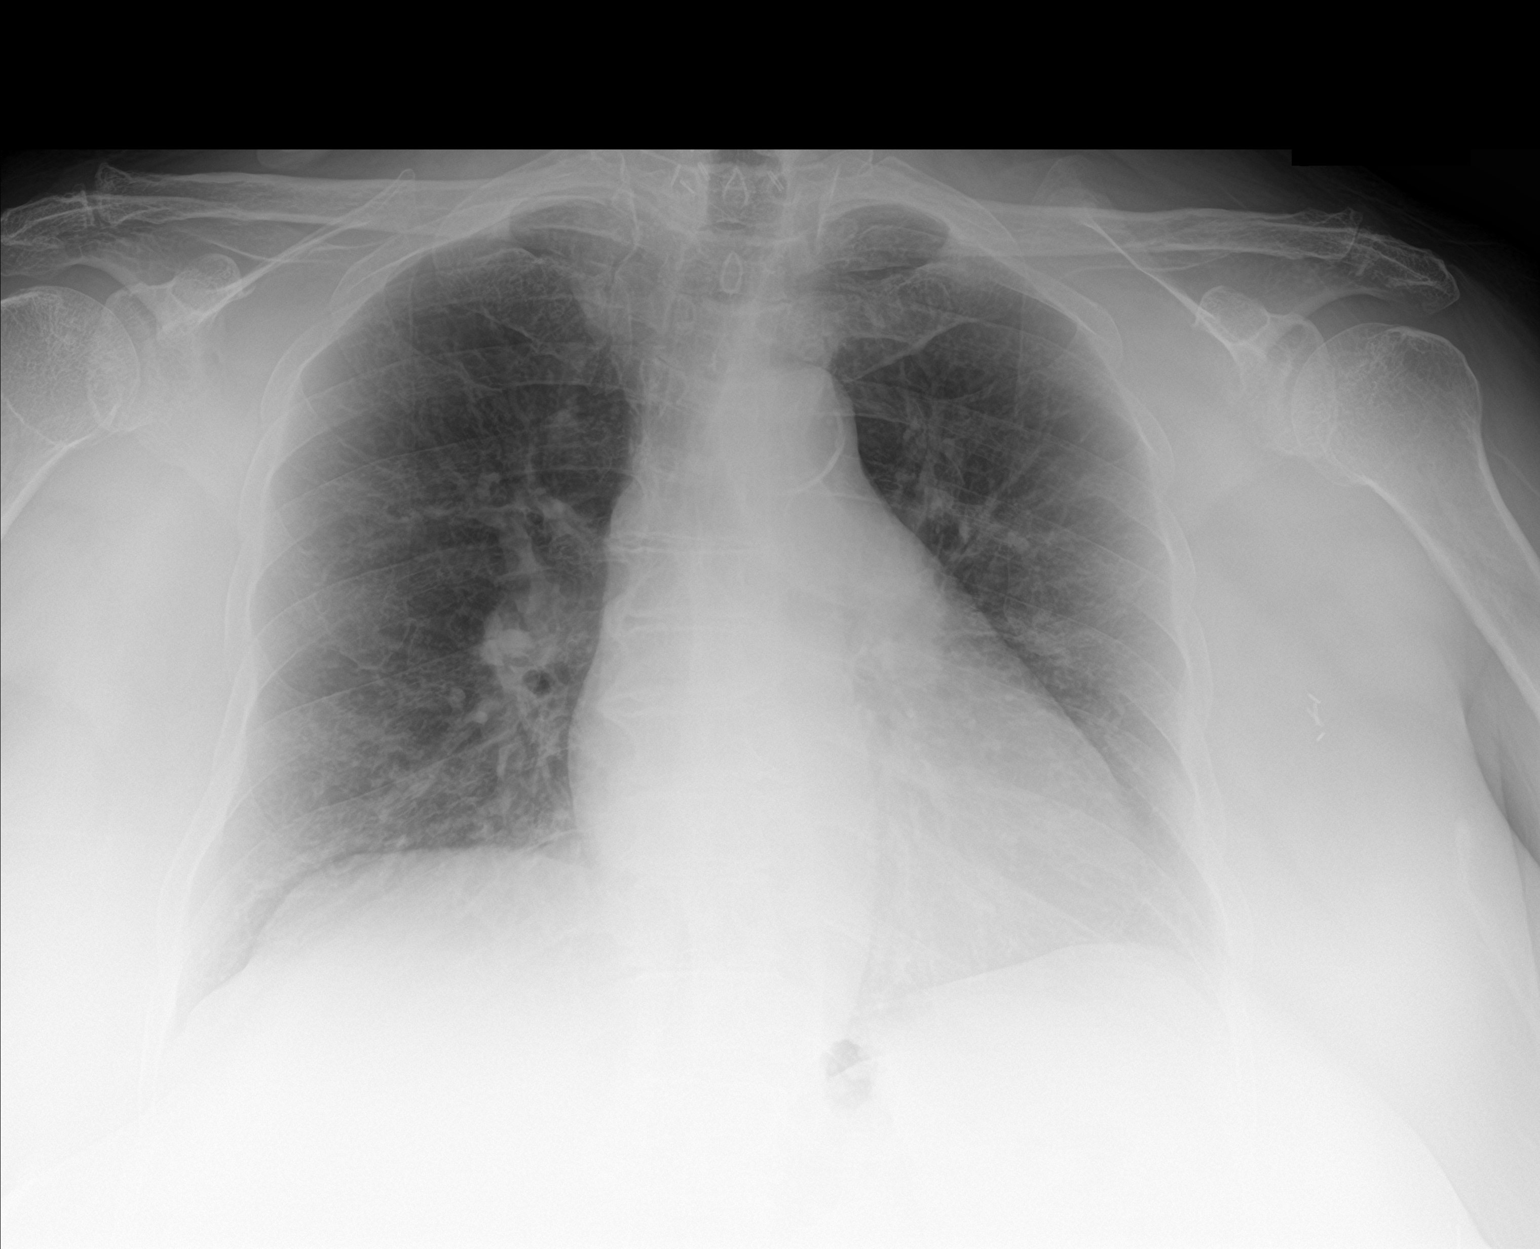

[2 of 2 positions shown; findings below may reference images not displayed]

FINDINGS: Stable borderline cardiomegaly with aortic atherosclerosis. Clear
lungs without pneumonic consolidation or CHF. No effusion or
pneumothorax. Surgical clips noted in the expected location of the
thyroid gland and left axilla. Less deviation of the trachea likely
from prior thyroid surgery. No acute osseous abnormality.
IMPRESSION: No active pulmonary disease. Aortic atherosclerosis. Borderline
cardiomegaly.

## 2017-10-06 NOTE — ED Triage Notes (Signed)
Patient complains of intermittent sharp chest pain and shortness of breath. States that she has this problem now since taking synthroid daily. Patient denies pain on arrival, alert and oriented, NAD. Speaking complete sentences

## 2017-10-06 NOTE — ED Provider Notes (Signed)
[facility] EMERGENCY DEPARTMENT Provider Note   CSN: [id number] Arrival date & time: 10/06/17  1803     History   Chief Complaint Chief Complaint  Patient presents with  . Chest Pain  . Shortness of Breath    HPI Sheila Jacobs is a 67 y.o. female.  The history is provided by the patient and medical records. No language interpreter was used.  Chest Pain   This is a chronic problem. The current episode started more than 1 week ago. The problem occurs daily. The problem has been rapidly improving. The pain is present in the substernal region and lateral region. The pain is moderate. The quality of the pain is described as sharp and stabbing. Radiates to: Radiates all of her body. Associated symptoms include abdominal pain and shortness of breath. Pertinent negatives include no back pain, no cough, no diaphoresis, no dizziness, no exertional chest pressure, no fever, no headaches, no irregular heartbeat, no leg pain, no lower extremity edema, no malaise/fatigue, no nausea, no near-syncope, no numbness, no palpitations, no sputum production, no syncope, no vomiting and no weakness. She has tried nothing for the symptoms. The treatment provided no relief. Risk factors include obesity.    Past Medical History:  Diagnosis Date  . Anginal pain ([place])   . Anxiety   . Arthritis   . Breast cancer ([place])   . Breast cancer of upper-outer quadrant of left female breast ([place]) 01/31/2016  . Colon polyps   . Enlarged LA (left atrium)   . GERD (gastroesophageal reflux disease)   . Hard of hearing    Wears hearing aids.  . Headache   . Heavy cigarette smoker    Quit 2015  . History of radiation therapy 06/18/16- 07/30/16   Left Breast 45 Gy in 25 fractions, Left Breast Boost 6 Gy in 3 fractions.   . Hyperlipidemia   . Hypertension   . Morbid obesity ([place])   . Numbness and tingling   . Obesity   . Thyroid goiter   . Vertigo    on occasion    Patient Active Problem  List   Diagnosis Date Noted  . Post-surgical hypothyroidism 06/27/2017  . Thyroid goiter 02/26/2017  . Palpitations 10/16/2016  . Personal history of noncompliance with medical treatment, presenting hazards to health 08/27/2016  . Generalized anxiety disorder 08/09/2016  . Abnormal TSH 05/09/2016  . Paresthesia 01/31/2016  . Abnormality of gait 01/31/2016  . Low back pain 01/31/2016  . Breast cancer of upper-outer quadrant of left female breast ([place]) 01/31/2016  . Former heavy cigarette smoker (20-39 per day) 11/08/2015  . Goiter 11/08/2015  . Esophageal reflux   . Special screening for malignant neoplasms, colon   . Hearing loss 10/07/2015  . GERD (gastroesophageal reflux disease) 05/03/2015  . Dependent edema 05/03/2015  . Morbid obesity ([place]) 04/29/2015  . Right carotid bruit 10/06/2014  . Hypokalemia   . Hypomagnesemia   . Atypical chest pain 09/18/2014  . Hypertension 09/18/2014  . Hyperlipidemia 09/18/2014  . Enlarged LA (left atrium)   . Chest pain, precordial 09/02/2014    Past Surgical History:  Procedure Laterality Date  . BREAST LUMPECTOMY Left 2017   radiation  . BREAST LUMPECTOMY WITH RADIOACTIVE SEED AND SENTINEL LYMPH NODE BIOPSY Left 04/11/2016   Procedure: BREAST LUMPECTOMY WITH RADIOACTIVE SEED AND SENTINEL LYMPH NODE BIOPSY;  Surgeon: Excell Seltzer, MD;  Location: [facility];  Service: General;  Laterality: Left;  . COLONOSCOPY WITH PROPOFOL N/A 10/20/2015  Procedure: COLONOSCOPY WITH PROPOFOL;  Surgeon: Mauri Pole, MD;  Location: [facility];  Service: Endoscopy;  Laterality: N/A;  . ESOPHAGOGASTRODUODENOSCOPY (EGD) WITH PROPOFOL N/A 10/20/2015   Procedure: ESOPHAGOGASTRODUODENOSCOPY (EGD) WITH PROPOFOL;  Surgeon: Mauri Pole, MD;  Location: [facility];  Service: Endoscopy;  Laterality: N/A;  . LEFT HEART CATHETERIZATION WITH CORONARY ANGIOGRAM N/A 10/21/2014   Procedure: LEFT HEART CATHETERIZATION WITH CORONARY ANGIOGRAM;  Surgeon: Burnell Blanks, MD;  Location: [facility] CATH LAB;  Service: Cardiovascular;  Laterality: N/A;  . THYROIDECTOMY N/A 02/26/2017   Procedure: TOTAL THYROIDECTOMY;  Surgeon: Excell Seltzer, MD;  Location: [facility];  Service: General;  Laterality: N/A;  . TUBAL LIGATION      OB History    No data available       Home Medications    Prior to Admission medications   Medication Sig Start Date End Date Taking? Authorizing Provider  ALPRAZolam (XANAX) 0.25 MG tablet TAKE ONE TABLET BY MOUTH TWICE DAILY AS NEEDED FOR ANXIETY 01/07/17   Henson, Vickie L, NP-C  anastrozole (ARIMIDEX) 1 MG tablet TAKE 1 TABLET BY MOUTH ONCE DAILY 06/05/17   Nicholas Lose, MD  aspirin EC 81 MG tablet Take 81 mg by mouth daily.    [provider]  calcium carbonate (TUMS - DOSED IN MG ELEMENTAL CALCIUM) 500 MG chewable tablet Chew 2 tablets (400 mg of elemental calcium total) by mouth 3 (three) times daily. 02/27/17   Excell Seltzer, MD  Cholecalciferol (VITAMIN D3 PO) Take 1 tablet by mouth daily.    [provider]  escitalopram (LEXAPRO) 5 MG tablet  05/08/17   [provider]  hydrochlorothiazide (HYDRODIURIL) 25 MG tablet Take 0.5 tablets (12.5 mg total) by mouth daily. 03/07/17   Henson, Vickie L, NP-C  HYDROcodone-acetaminophen (NORCO/VICODIN) 5-325 MG tablet Take 1-2 tablets by mouth every 4 (four) hours as needed for moderate pain or severe pain. 02/27/17   Excell Seltzer, MD  ketoconazole (NIZORAL) 2 % cream APPLY TOPICALLY DAILY AS NEEDED FOR IRRITATION 01/07/17   Wallene Huh, DPM  levothyroxine (SYNTHROID, LEVOTHROID) 150 MCG tablet Take 1 tablet (150 mcg total) by mouth daily. 08/29/17   Elayne Snare, MD  pantoprazole (PROTONIX) 40 MG tablet Take 1 tablet (40 mg total) by mouth daily. 07/31/16   Burtis Junes, NP  potassium chloride SA (K-DUR,KLOR-CON) 20 MEQ tablet Take 20 mEq by mouth every 30 (thirty) days.    [provider]    Family History Family History  Problem  Relation Age of Onset  . Hypertension Mother   . Heart disease Mother        Pacemaker  . Kidney disease Mother   . Cataracts Mother   . Hearing loss Mother   . Diabetes Mother   . Liver disease Mother   . Diabetes Sister   . Cataracts Brother   . Hearing loss Brother   . Colon cancer Neg Hx   . Thyroid disease Neg Hx     Social History Social History   Tobacco Use  . Smoking status: Former Smoker    Packs/day: 1.00    Years: 45.00    Pack years: 45.00    Types: Cigarettes    Last attempt to quit: 07/11/2014    Years since quitting: 3.2  . Smokeless tobacco: Never Used  Substance Use Topics  . Alcohol use: No    Alcohol/week: 0.0 oz  . Drug use: No     Allergies   Cortisone; Effexor [venlafaxine]; and  Lexapro [escitalopram]   Review of Systems Review of Systems  Constitutional: Positive for fatigue. Negative for chills, diaphoresis, fever and malaise/fatigue.  HENT: Negative for congestion.   Eyes: Negative for photophobia.  Respiratory: Positive for shortness of breath. Negative for cough, sputum production, choking, chest tightness, wheezing and stridor.   Cardiovascular: Positive for chest pain. Negative for palpitations, syncope and near-syncope.  Gastrointestinal: Positive for abdominal pain. Negative for constipation, diarrhea, nausea and vomiting.  Genitourinary: Negative for dysuria.  Musculoskeletal: Negative for back pain, neck pain and neck stiffness.  Skin: Negative for rash and wound.  Neurological: Negative for dizziness, weakness, light-headedness, numbness and headaches.  Psychiatric/Behavioral: Negative for agitation.  All other systems reviewed and are negative.    Physical Exam Updated Vital Signs BP (!) 133/99   Pulse 74   Temp 98.2 F (36.8 C) (Oral)   Resp 16   SpO2 100%   Physical Exam  Constitutional: She is oriented to person, place, and time. She appears well-developed and well-nourished.  Non-toxic appearance. She does not  appear ill. No distress.  HENT:  Head: Normocephalic and atraumatic.  Mouth/Throat: Oropharynx is clear and moist. No oropharyngeal exudate.  Eyes: Conjunctivae and EOM are normal. Pupils are equal, round, and reactive to light.  Neck: Normal range of motion. Neck supple.  Cardiovascular: Normal rate, regular rhythm and normal pulses.  No murmur heard. Pulmonary/Chest: Effort normal and breath sounds normal. No stridor. No respiratory distress. She has no decreased breath sounds. She has no wheezes. She has no rhonchi. She has no rales. She exhibits no tenderness.  Abdominal: Soft. There is no tenderness.  Musculoskeletal: She exhibits edema. She exhibits no tenderness.       Right lower leg: She exhibits edema. She exhibits no tenderness.       Left lower leg: She exhibits edema. She exhibits no tenderness.  Neurological: She is alert and oriented to person, place, and time. She is not disoriented. No cranial nerve deficit or sensory deficit. She exhibits normal muscle tone.  Skin: Skin is warm and dry. Capillary refill takes less than 2 seconds. She is not diaphoretic. No erythema.  Psychiatric: She has a normal mood and affect. Her mood appears not anxious. She is not agitated.  Nursing note and vitals reviewed.    ED Treatments / Results  Labs (all labs ordered are listed, but only abnormal results are displayed) Labs Reviewed  CBC - Abnormal; Notable for the following components:      Result Value   WBC 3.5 (*)    All other components within normal limits  BASIC METABOLIC PANEL  TSH  I-STAT TROPONIN, ED  I-STAT TROPONIN, ED    EKG  EKG Interpretation  Date/Time:  Sunday October 06 2017 23:39:50 EST Ventricular Rate:  55 PR Interval:    QRS Duration: 104 QT Interval:  459 QTC Calculation: 439 R Axis:   -2 Text Interpretation:  Sinus rhythm Prolonged PR interval Baseline wander in lead(s) V2 V3 When compared to prior, slower rate.  No STEMI Confirmed by Antony Blackbird  [phone]) on 10/06/2017 11:54:55 PM       Radiology Dg Chest 2 View  Result Date: 10/06/2017 CLINICAL DATA:  Intermittent sharp chest pain and dyspnea. EXAM: CHEST  2 VIEW COMPARISON:  02/25/2017 FINDINGS: Stable borderline cardiomegaly with aortic atherosclerosis. Clear lungs without pneumonic consolidation or CHF. No effusion or pneumothorax. Surgical clips noted in the expected location of the thyroid gland and left axilla. Less deviation of the trachea likely  from prior thyroid surgery. No acute osseous abnormality. IMPRESSION: No active pulmonary disease. Aortic atherosclerosis. Borderline cardiomegaly. Electronically Signed   By: Ashley Royalty M.D.   On: 10/06/2017 18:51    Procedures Procedures (including critical care time)  Medications Ordered in ED Medications - No data to display   Initial Impression / Assessment and Plan / ED Course  I have reviewed the triage vital signs and the nursing notes.  Pertinent labs & imaging results that were available during my care of the patient were reviewed by me and considered in my medical decision making (see chart for details).     NYASHIA RANEY is a 67 y.o. female with a past medical history significant for hypertension, hyperlipidemia, obesity, prior breast cancer, thyroid goiter status post resection last year on levothyroxine, GERD, vertigo, and anxiety who presents with sporadic episodes of sharp chest pain, shortness of breath, and full body tingling.  Patient reports that since she has been on Synthroid and more specifically increased her Synthroid several weeks ago, she has been having these episodes intermittently.  She denies any fevers or chills but does report some fatigue.  She reports having some rhinorrhea and congestion and dry cough.  She denies any production in her cough.  She reports that when she has the chest pain it is sharp, electric, and goes throughout her body and across her chest and abdomen.  She reports it lasts  for several seconds and then goes away.  She thinks is related to her levothyroxine.  She reports that she stopped taking it yesterday and her numerous episodes of this feeling decreased today.  She is only had 1 of them today.  She says that she has been having them for weeks.  She denies any nausea, vomiting, conservation, diarrhea, or urinary symptoms.  She denies any trauma.  She denies any other complaints on arrival.  On exam, lungs are clear.  Chest is nontender.  Abdomen nontender throughout.  No CVA tenderness.  Patient had some edema in her legs which she reports is unchanged from prior.  Back is nontender.  No focal neurologic deficits.  Patient had reassuring vital signs on arrival with no tachycardia, hypoxia, or abnormal temperature/blood pressure.  Patient will have her TSH checked to determine if patient is having significant ab normalities with her thyroid management.  Patient will also have workup to look for etiology of chest pain with a troponin, chest x-ray, and lab testing.  Electrolyte abnormalities will be checked to determine if they could contribute to the patient's spasm-like body pain.  Diagnostic testing results are seen above.  Troponin negative.  BMP showed normal electrolytes.  CBC showed no evidence of leukocytosis and no anemia.  TSH is still in process.  Chest x-ray shows no pneumonia or fluid overload.  12:14 AM TSH returned within normal limits.  This is improved from prior.  Given reassuring workup and reassuring TSH, patient stable for discharge home.  She has had no chest pains here in the emergency department has had no abnormalities.  Patient reports feeling normal at this time.  Patient instructed to continue her home medicines including the levothyroxine until she speaks with her PCP tomorrow.  Patient will follow up with them this week to determine if she needs to make changes as her symptoms appear to be related to her levothyroxine use.  Patient's electro  lites were otherwise reassuring and patient was instructed to stay hydrated.  Patient understood return precautions and was discharged in  good condition.       Final Clinical Impressions(s) / ED Diagnoses   Final diagnoses:  Precordial pain    ED Discharge Orders    None      Clinical Impression: 1. Precordial pain     Disposition: Discharge  Condition: Good  I have discussed the results, Dx and Tx plan with the pt(& family if present). He/she/they expressed understanding and agree(s) with the plan. Discharge instructions discussed at great length. Strict return precautions discussed and pt &/or family have verbalized understanding of the instructions. No further questions at time of discharge.    New Prescriptions   No medications on file    Follow Up: Velna Hatchet, [place] [address] [place] [address] [id number] mc [place]       Adrine Hayworth, Gwenyth Allegra, MD 10/07/17 [phone]

## 2017-10-07 LAB — TSH: TSH: 2.985 u[IU]/mL (ref 0.350–4.500)

## 2017-10-07 NOTE — Discharge Instructions (Signed)
Your workup today showed no evidence of a cardiac or lung cause of your pains.  Your pain sounded spasm like throughout her body and does not sound cardiac.  Your workup today was reassuring.  Please continue taking your home medicines and follow-up with your primary doctor for further management.  If any symptoms change or worsen, please return to the nearest emergency department.  Please stay hydrated.

## 2017-10-15 ENCOUNTER — Ambulatory Visit: Payer: Medicare Other | Admitting: Endocrinology

## 2017-10-16 ENCOUNTER — Ambulatory Visit: Payer: Medicare Other | Admitting: Endocrinology

## 2017-10-16 ENCOUNTER — Telehealth: Payer: Self-pay | Admitting: Endocrinology

## 2017-10-16 NOTE — Telephone Encounter (Signed)
Pt went to the hospital and they stated her thyroid was fine. She needs a refill but wanted to know if dosage needs to be lowered She has four tablets left.  levothyroxine (SYNTHROID, LEVOTHROID) 150 MCG tablet    [place], [place] - [address]  PLEASE ADVISE

## 2017-10-17 ENCOUNTER — Ambulatory Visit (INDEPENDENT_AMBULATORY_CARE_PROVIDER_SITE_OTHER): Payer: Medicare Other | Admitting: Endocrinology

## 2017-10-17 ENCOUNTER — Encounter: Payer: Self-pay | Admitting: Endocrinology

## 2017-10-17 VITALS — BP 154/73 | HR 78 | Ht 63.0 in | Wt 306.4 lb

## 2017-10-17 DIAGNOSIS — E890 Postprocedural hypothyroidism: Secondary | ICD-10-CM

## 2017-10-17 DIAGNOSIS — I10 Essential (primary) hypertension: Secondary | ICD-10-CM | POA: Diagnosis not present

## 2017-10-17 NOTE — Progress Notes (Signed)
Patient ID: Sheila Jacobs, female   DOB: 09-10-1951, 67 y.o.   MRN: [id number]           Reason for Appointment: HYPOTHYROIDISM, follow-up    History of Present Illness:   The patient's thyroid enlargement was first discovered in 2009 probably on her routine physical exam  [facility]   She however does not know when her goiter first started    On her initial evaluation she did have needle biopsy of her large right-sided nodule which was benign  Clinically she was felt to have a colloid goiter, mostly right sided  Because of her local pressure symptoms with choking feeling, coughing she was referred for thyroidectomy which was done in June 2018 This had relieved her local pressure symptoms  RECENT history:  She appears to have required progressively higher doses of levothyroxine surgery She had an increase in her dose in 06/2017 and subsequently increased further up to 150 mcg daily in 08/2017  She has called several times stating that she thinks her thyroid medication taking 137 g daily However she has been reluctant to increase her dose because she claims that with starting the levothyroxine she would have chest pains which are sharp in nature and across the upper chest, some to the left and into the left neck She thinks sometimes she has trouble swallowing Occasionally will have headaches She thinks the pain no other symptoms occur the day after she takes her levothyroxine and it may not happen if she does not take the medication  She did not complain of any palpitations, shakiness or nervousness in the last few weeks Her weight appears to fluctuate  However she has been taking her levothyroxine more regularly for the last few weeks She takes this before breakfast and does not eat for 30 minutes  She  was evaluated in the emergency room and no obvious etiology found  Also her TSH is now back to normal compared to 17 and 21  previously in December    Wt Readings from Last 3 Encounters:  10/17/17 (!) 306 lb 6.4 oz (139 kg)  08/29/17 (!) 313 lb 3.2 oz (142.1 kg)  06/27/17 (!) 308 lb (139.7 kg)      Lab Results  Component Value Date   FREET4 0.73 08/13/2017   FREET4 1.02 12/10/2016   FREET4 1.2 05/09/2016   TSH 2.985 10/06/2017   TSH 17.82 (H) 08/29/2017   TSH 21.52 (H) 08/13/2017       Allergies as of 10/17/2017      Reactions   Cortisone Rash   Effexor [venlafaxine] Palpitations   Lexapro [escitalopram] Palpitations   Heart racing      Medication List        Accurate as of 10/17/17  1:13 PM. Always use your most recent med list.          ALPRAZolam 0.25 MG tablet Commonly known as:  XANAX TAKE ONE TABLET BY MOUTH TWICE DAILY AS NEEDED FOR ANXIETY   anastrozole 1 MG tablet Commonly known as:  ARIMIDEX TAKE 1 TABLET BY MOUTH ONCE DAILY   aspirin EC 81 MG tablet Take 81 mg by mouth daily.   calcium carbonate 500 MG chewable tablet Commonly known as:  TUMS - dosed in mg elemental calcium Chew 2 tablets (400 mg of elemental calcium total) by mouth 3 (three) times daily.   hydrochlorothiazide 25 MG tablet Commonly known as:  HYDRODIURIL Take 0.5 tablets (12.5  mg total) by mouth daily.   HYDROcodone-acetaminophen 5-325 MG tablet Commonly known as:  NORCO/VICODIN Take 1-2 tablets by mouth every 4 (four) hours as needed for moderate pain or severe pain.   ketoconazole 2 % cream Commonly known as:  NIZORAL APPLY TOPICALLY DAILY AS NEEDED FOR IRRITATION   levothyroxine 150 MCG tablet Commonly known as:  SYNTHROID, LEVOTHROID Take 1 tablet (150 mcg total) by mouth daily.   pantoprazole 40 MG tablet Commonly known as:  PROTONIX Take 1 tablet (40 mg total) by mouth daily.       Allergies:  Allergies  Allergen Reactions  . Cortisone Rash  . Effexor [Venlafaxine] Palpitations  . Lexapro [Escitalopram] Palpitations    Heart racing    Past Medical History:  Diagnosis Date   . Anginal pain ([place])   . Anxiety   . Arthritis   . Breast cancer ([place])   . Breast cancer of upper-outer quadrant of left female breast ([place]) 01/31/2016  . Colon polyps   . Enlarged LA (left atrium)   . GERD (gastroesophageal reflux disease)   . Hard of hearing    Wears hearing aids.  . Headache   . Heavy cigarette smoker    Quit 2015  . History of radiation therapy 06/18/16- 07/30/16   Left Breast 45 Gy in 25 fractions, Left Breast Boost 6 Gy in 3 fractions.   . Hyperlipidemia   . Hypertension   . Morbid obesity ([place])   . Numbness and tingling   . Obesity   . Thyroid goiter   . Vertigo    on occasion    Past Surgical History:  Procedure Laterality Date  . BREAST LUMPECTOMY Left 2017   radiation  . BREAST LUMPECTOMY WITH RADIOACTIVE SEED AND SENTINEL LYMPH NODE BIOPSY Left 04/11/2016   Procedure: BREAST LUMPECTOMY WITH RADIOACTIVE SEED AND SENTINEL LYMPH NODE BIOPSY;  Surgeon: Excell Seltzer, MD;  Location: [facility];  Service: General;  Laterality: Left;  . COLONOSCOPY WITH PROPOFOL N/A 10/20/2015   Procedure: COLONOSCOPY WITH PROPOFOL;  Surgeon: Mauri Pole, MD;  Location: [facility];  Service: Endoscopy;  Laterality: N/A;  . ESOPHAGOGASTRODUODENOSCOPY (EGD) WITH PROPOFOL N/A 10/20/2015   Procedure: ESOPHAGOGASTRODUODENOSCOPY (EGD) WITH PROPOFOL;  Surgeon: Mauri Pole, MD;  Location: [facility];  Service: Endoscopy;  Laterality: N/A;  . LEFT HEART CATHETERIZATION WITH CORONARY ANGIOGRAM N/A 10/21/2014   Procedure: LEFT HEART CATHETERIZATION WITH CORONARY ANGIOGRAM;  Surgeon: Burnell Blanks, MD;  Location: [facility] CATH LAB;  Service: Cardiovascular;  Laterality: N/A;  . THYROIDECTOMY N/A 02/26/2017   Procedure: TOTAL THYROIDECTOMY;  Surgeon: Excell Seltzer, MD;  Location: [facility];  Service: General;  Laterality: N/A;  . TUBAL LIGATION      Family History  Problem Relation Age of Onset  . Hypertension Mother   . Heart disease Mother        Pacemaker  .  Kidney disease Mother   . Cataracts Mother   . Hearing loss Mother   . Diabetes Mother   . Liver disease Mother   . Diabetes Sister   . Cataracts Brother   . Hearing loss Brother   . Colon cancer Neg Hx   . Thyroid disease Neg Hx     Social History:  reports that she quit smoking about 3 years ago. Her smoking use included cigarettes. She has a 45.00 pack-year smoking history. she has never used smokeless tobacco. She reports that she does not drink alcohol or use drugs.    Review of Systems  Cardiovascular: Positive for palpitations.       Sometimes she may feel her heart "irregular"    Sometimes has headaches     Hypertension has been managed by PCP, tends to have high readings recently  BP Readings from Last 3 Encounters:  10/17/17 (!) 154/73  10/07/17 (!) 161/55  08/29/17 126/74       Examination:   BP (!) 154/73 (BP Location: Right Arm, Patient Position: Sitting, Cuff Size: Large)   Pulse 78   Ht 5\' 3"  (1.6 m)   Wt (!) 306 lb 6.4 oz (139 kg)   BMI 54.28 kg/m           She looks well.  Somewhat anxious     REFLEXES: at biceps are normal.  No tremor Heart rate is regular, no abnormal heart sounds  Assessment/Plan:  Postsurgical hypothyroidism  She is concerned about levothyroxine causing side effects and taking to his medication Reassured her that since her weight is over 300 pounds her dose of 150 mcg probably lower than expected Also explained to her that since her TSH has not not been low and only recently finally coming back to normal her continuous complaints of chest pain, headaches and other nonspecific symptoms along with dysphagia are unrelated to taking the levothyroxine  She may have headaches related to her blood pressure Also appears to have atypical chest pains but it may be reasonable to have her see a cardiologist  She will continue the same regimen of levothyroxine 150 mcg daily  Discussed that she needs to see her PCP in the next  couple weeks and evaluate all these things further  CALCIUM supplements: Since her calcium has been consistently normal she does not need to take any supplements now    Elayne Snare 10/17/2017

## 2017-10-17 NOTE — Patient Instructions (Addendum)
Stop Calcium  1/2 thyroid pill 2x daily

## 2017-10-17 NOTE — Telephone Encounter (Signed)
Please advise if okay to keep the same strength for refill?

## 2017-10-23 ENCOUNTER — Ambulatory Visit (INDEPENDENT_AMBULATORY_CARE_PROVIDER_SITE_OTHER): Payer: Medicare Other | Admitting: Cardiology

## 2017-10-23 ENCOUNTER — Encounter: Payer: Self-pay | Admitting: Cardiology

## 2017-10-23 ENCOUNTER — Ambulatory Visit (HOSPITAL_COMMUNITY)
Admission: RE | Admit: 2017-10-23 | Discharge: 2017-10-23 | Disposition: A | Discharge status: Home / Self Care | Payer: Medicare Other | Source: Ambulatory Visit | Attending: Cardiovascular Disease | Admitting: Cardiovascular Disease

## 2017-10-23 VITALS — BP 120/66 | HR 79 | Ht 63.0 in | Wt 312.0 lb

## 2017-10-23 DIAGNOSIS — R0789 Other chest pain: Secondary | ICD-10-CM

## 2017-10-23 DIAGNOSIS — Z853 Personal history of malignant neoplasm of breast: Secondary | ICD-10-CM | POA: Insufficient documentation

## 2017-10-23 DIAGNOSIS — E049 Nontoxic goiter, unspecified: Secondary | ICD-10-CM | POA: Diagnosis not present

## 2017-10-23 DIAGNOSIS — I739 Peripheral vascular disease, unspecified: Secondary | ICD-10-CM

## 2017-10-23 NOTE — Assessment & Plan Note (Signed)
Super morbid obesity-BMI 80

## 2017-10-23 NOTE — Patient Instructions (Signed)
Medication Instructions: Your physician recommends that you continue on your current medications as directed. Please refer to the Current Medication list given to you today.   Testing/Procedures: Your physician has requested that you have an ankle brachial index (ABI)--today. During this test an ultrasound and blood pressure cuff are used to evaluate the arteries that supply the arms and legs with blood. Allow thirty minutes for this exam. There are no restrictions or special instructions.   Follow-Up: Your physician recommends that you schedule a follow-up appointment as needed.

## 2017-10-23 NOTE — Progress Notes (Signed)
10/23/2017 Sheila Jacobs   [birth year]  [id number]  Primary Physician Velna Hatchet, MD Primary Cardiologist: Dr [name]  HPI:  67 y/o AA female with h/o super morbid obesity,  HTN, HLD, tobacco abuse (quit in 2015) and left sided breast CA treated with radiation. She was seen back in 2016 for chest pain. Patient had a High risk stress nuclear study 2/4/2016with a large, severe, reversible inferior defect consistent with severe inferior ischemia. On 10/21/14, she was referred for definitive LHC. This was performed by Dr. Angelena Form. She was found to have no angiographic evidence of CAD. She was noted to have normal LV systolic function. Her CP was ruled as non-cardiac.  She was seen the ED recently with chest pain and SOB. Work up was unremarkable. HerTroponin was negative and her CXR and EKG were unremarkable. She says she was told by one of her doctors that her pain could be from her heart. She describes Lt sided localized "sharp" chest pain that can radiate to her arms and down her Rt leg. She has several other complaints, incluuding leg swelling (previous negative LE venous dopplers), pain in the bottoms of her feet, and bilateral calf pain with walking.    Current Outpatient Medications  Medication Sig Dispense Refill  . ALPRAZolam (XANAX) 0.25 MG tablet TAKE ONE TABLET BY MOUTH TWICE DAILY AS NEEDED FOR ANXIETY 30 tablet 1  . anastrozole (ARIMIDEX) 1 MG tablet TAKE 1 TABLET BY MOUTH ONCE DAILY 90 tablet 2  . aspirin EC 81 MG tablet Take 81 mg by mouth daily.    . hydrochlorothiazide (HYDRODIURIL) 25 MG tablet Take 25 mg by mouth daily.    Marland Kitchen ketoconazole (NIZORAL) 2 % cream APPLY TOPICALLY DAILY AS NEEDED FOR IRRITATION 30 g 0  . levothyroxine (SYNTHROID, LEVOTHROID) 150 MCG tablet Take 1 tablet (150 mcg total) by mouth daily. 30 tablet 3  . pantoprazole (PROTONIX) 40 MG tablet Take 1 tablet (40 mg total) by mouth daily. 30 tablet 11   No current facility-administered medications  for this visit.     Allergies  Allergen Reactions  . Cortisone Rash  . Effexor [Venlafaxine] Palpitations  . Lexapro [Escitalopram] Palpitations    Heart racing    Past Medical History:  Diagnosis Date  . Anginal pain ([place])   . Anxiety   . Arthritis   . Breast cancer ([place])   . Breast cancer of upper-outer quadrant of left female breast ([place]) 01/31/2016  . Colon polyps   . Enlarged LA (left atrium)   . GERD (gastroesophageal reflux disease)   . Hard of hearing    Wears hearing aids.  . Headache   . Heavy cigarette smoker    Quit 2015  . History of radiation therapy 06/18/16- 07/30/16   Left Breast 45 Gy in 25 fractions, Left Breast Boost 6 Gy in 3 fractions.   . Hyperlipidemia   . Hypertension   . Morbid obesity ([place])   . Numbness and tingling   . Obesity   . Thyroid goiter   . Vertigo    on occasion    Social History   Socioeconomic History  . Marital status: Single    Spouse name: Not on file  . Number of children: 1  . Years of education: 21  . Highest education level: Not on file  Social Needs  . Financial resource strain: Not on file  . Food insecurity - worry: Not on file  . Food insecurity - inability: Not on file  .  Transportation needs - medical: Not on file  . Transportation needs - non-medical: Not on file  Occupational History  . Occupation: Retired  Tobacco Use  . Smoking status: Former Smoker    Packs/day: 1.00    Years: 45.00    Pack years: 45.00    Types: Cigarettes    Last attempt to quit: 07/11/2014    Years since quitting: 3.2  . Smokeless tobacco: Never Used  Substance and Sexual Activity  . Alcohol use: No    Alcohol/week: 0.0 oz  . Drug use: No  . Sexual activity: Not on file  Other Topics Concern  . Not on file  Social History Narrative   Lives with daughter.    Right-hand.   Occasional use of caffeine.      Family History  Problem Relation Age of Onset  . Hypertension Mother   . Heart disease Mother        Pacemaker    . Kidney disease Mother   . Cataracts Mother   . Hearing loss Mother   . Diabetes Mother   . Liver disease Mother   . Diabetes Sister   . Cataracts Brother   . Hearing loss Brother   . Colon cancer Neg Hx   . Thyroid disease Neg Hx      Review of Systems: General: negative for chills, fever, night sweats or weight changes.  Cardiovascular: negative for chest pain, dyspnea on exertion, edema, orthopnea, palpitations, paroxysmal nocturnal dyspnea or shortness of breath Dermatological: negative for rash Respiratory: negative for cough or wheezing Urologic: negative for hematuria Abdominal: negative for nausea, vomiting, diarrhea, bright red blood per rectum, melena, or hematemesis Neurologic: negative for visual changes, syncope, or dizziness All other systems reviewed and are otherwise negative except as noted above.    Blood pressure 120/66, pulse 79, height 5\' 3"  (1.6 m), weight (!) 312 lb (141.5 kg), SpO2 95 %.  General appearance: alert, cooperative, no distress, morbidly obese and poor dentition Neck: no carotid bruit Lungs: faint wheezing Lt base Heart: regular rate and rhythm Extremities: both legs are large- no obvious edema, I was unable to palpate LEA pulses Skin: Skin color, texture, turgor normal. No rashes or lesions Neurologic: Grossly normal  EKG NSR, LAD1/27/19  ASSESSMENT AND PLAN:   Atypical chest pain This has been a recurrent compliant. It is atypical for angina and she has had angiographically normal coronaries in 2016 (after an abnormal Myoview)  Morbid obesity Super morbid obesity-BMI 86  History of breast cancer She reportedly received Lt breast radiation Rx  Goiter Followed by Dr Dwyane Dee. TSH was recently WNL. The attributes much of her symptoms to her thyroid treatment   PLAN  I told Ms Weather I did not think her chest pain was from her heart. Her symptoms are totally atypical and she has had normal coronaries in the past. I suspect her  abnormal Myoview was secondary to her super morbid obesity. The only way to determine if her chest pain is cardiac would be another coronary angiogram and I don't feel her symptoms or presentation warant that.  I suggested that possibly her chest pain may be pleuritic-? secondary to prior radiation Rx. I suggested she try a NSAID to see if that helps. She also had complained about calf pain when walking and I'll get LEA dopplers since I couldn't feel a pulse. If these are negative we can see her PRN.    Kerin Ransom PA-C 10/23/2017 10:20 AM

## 2017-10-23 NOTE — Assessment & Plan Note (Signed)
She reportedly received Lt breast radiation Rx

## 2017-10-23 NOTE — Assessment & Plan Note (Signed)
Followed by Dr Dwyane Dee. TSH was recently WNL. The attributes much of her symptoms to her thyroid treatment

## 2017-10-23 NOTE — Assessment & Plan Note (Signed)
This has been a recurrent compliant. It is atypical for angina and she has had angiographically normal coronaries in 2016 (after an abnormal Myoview)

## 2017-10-27 ENCOUNTER — Other Ambulatory Visit: Payer: Self-pay | Admitting: Nurse Practitioner

## 2017-11-01 ENCOUNTER — Telehealth: Payer: Self-pay | Admitting: *Deleted

## 2017-11-01 NOTE — Telephone Encounter (Signed)
Refill request for Ketoconazole. Dr. Paulla Dolly states pt needs an appt, prior to future refills. Return fax denying.

## 2017-11-08 ENCOUNTER — Telehealth: Payer: Self-pay | Admitting: Cardiology

## 2017-11-08 NOTE — Telephone Encounter (Signed)
Patient calling for test results. °

## 2017-11-08 NOTE — Telephone Encounter (Signed)
RESULT OF DOPPLER GIVEN TO PATIENT. VERBALIZED UNDERSTANDING

## 2017-11-22 ENCOUNTER — Other Ambulatory Visit: Payer: Self-pay

## 2017-11-22 ENCOUNTER — Ambulatory Visit: Payer: Medicare Other | Admitting: Endocrinology

## 2017-11-22 DIAGNOSIS — C50412 Malignant neoplasm of upper-outer quadrant of left female breast: Secondary | ICD-10-CM

## 2017-11-25 ENCOUNTER — Inpatient Hospital Stay: Payer: Medicare Other | Attending: Hematology and Oncology | Admitting: Hematology and Oncology

## 2017-11-25 ENCOUNTER — Inpatient Hospital Stay: Payer: Medicare Other

## 2017-11-25 DIAGNOSIS — Z79811 Long term (current) use of aromatase inhibitors: Secondary | ICD-10-CM | POA: Diagnosis not present

## 2017-11-25 DIAGNOSIS — C50412 Malignant neoplasm of upper-outer quadrant of left female breast: Secondary | ICD-10-CM

## 2017-11-25 DIAGNOSIS — Z923 Personal history of irradiation: Secondary | ICD-10-CM

## 2017-11-25 DIAGNOSIS — Z79899 Other long term (current) drug therapy: Secondary | ICD-10-CM | POA: Diagnosis not present

## 2017-11-25 DIAGNOSIS — Z170 Estrogen receptor positive status [ER+]: Secondary | ICD-10-CM | POA: Diagnosis not present

## 2017-11-25 DIAGNOSIS — Z7982 Long term (current) use of aspirin: Secondary | ICD-10-CM

## 2017-11-25 LAB — CMP (CANCER CENTER ONLY)
ALBUMIN: 3.6 g/dL (ref 3.5–5.0)
ALK PHOS: 74 U/L (ref 40–150)
ALT: 11 U/L (ref 0–55)
AST: 19 U/L (ref 5–34)
Anion gap: 8 (ref 3–11)
BILIRUBIN TOTAL: 0.4 mg/dL (ref 0.2–1.2)
BUN: 11 mg/dL (ref 7–26)
CALCIUM: 9.8 mg/dL (ref 8.4–10.4)
CO2: 29 mmol/L (ref 22–29)
Chloride: 103 mmol/L (ref 98–109)
Creatinine: 0.96 mg/dL (ref 0.60–1.10)
GFR, Est AFR Am: >60 mL/min (ref >60)
GFR, Estimated: 59 mL/min — ABNORMAL LOW (ref >60)
GLUCOSE: 111 mg/dL (ref 70–140)
Potassium: 3.6 mmol/L (ref 3.5–5.1)
SODIUM: 140 mmol/L (ref 136–145)
TOTAL PROTEIN: 7.8 g/dL (ref 6.4–8.3)

## 2017-11-25 LAB — CBC WITH DIFFERENTIAL (CANCER CENTER ONLY)
BASOS ABS: 0 10*3/uL (ref 0.0–0.1)
BASOS PCT: 1 %
Eosinophils Absolute: 0.2 10*3/uL (ref 0.0–0.5)
Eosinophils Relative: 4 %
HEMATOCRIT: 36.8 % (ref 34.8–46.6)
HEMOGLOBIN: 12.2 g/dL (ref 11.6–15.9)
Lymphocytes Relative: 36 %
Lymphs Abs: 1.7 10*3/uL (ref 0.9–3.3)
MCH: 32.4 pg (ref 25.1–34.0)
MCHC: 33.2 g/dL (ref 31.5–36.0)
MCV: 97.9 fL (ref 79.5–101.0)
MONOS PCT: 10 %
Monocytes Absolute: 0.5 10*3/uL (ref 0.1–0.9)
NEUTROS ABS: 2.3 10*3/uL (ref 1.5–6.5)
NEUTROS PCT: 49 %
Platelet Count: 238 10*3/uL (ref 145–400)
RBC: 3.76 MIL/uL (ref 3.70–5.45)
RDW: 12.5 % (ref 11.2–14.5)
WBC: 4.7 10*3/uL (ref 3.9–10.3)

## 2017-11-25 MED ORDER — ANASTROZOLE 1 MG PO TABS: 1.0000 mg | ORAL_TABLET | Freq: Every day | ORAL | 3 refills | Status: DC

## 2017-11-25 NOTE — Progress Notes (Signed)
Patient Care Team: Velna Hatchet, MD as PCP - General (Internal Medicine) Nicholas Lose, MD as Consulting Physician (Hematology and Oncology) Excell Seltzer, MD as Consulting Physician (General Surgery) Delice Bison Charlestine Massed, NP as Nurse Practitioner (Hematology and Oncology) Eppie Gibson, MD as Attending Physician (Radiation Oncology)  DIAGNOSIS:  Encounter Diagnosis  Name Primary?  . Malignant neoplasm of upper-outer quadrant of left breast in female, estrogen receptor positive ([place])     SUMMARY OF ONCOLOGIC HISTORY:   Breast cancer of upper-outer quadrant of left female breast ([place])   01/27/2016 Initial Diagnosis    Screening mammogram: Left breast mass 9 mm, grade 1-2 IDC with DCIS, ER 100%, PR 90%, Ki-67 10%, HER-2 negative ratio 1.5, T1 BN 0 stage IA clinical stage      04/11/2016 Surgery    Left lumpectomy ([facility]): IDC grade 3, 1.2 cm, IG DCIS, LVI present, 0/8 lymph nodes negative, ER 100%, PR 90%, HER-2 negative, Ki-67 10%, T1 cN0 stage I a pathologic stage      06/18/2016 - 07/30/2016 Radiation Therapy    Adj XRT Isidore Moos): 1) Left Breast / 45 Gy in 25 fractions.  2) Left Breast Boost /  6 Gy in 3 fractions      08/29/2016 -  Anti-estrogen oral therapy    Anastrozole daily       CHIEF COMPLIANT: Follow-up on anastrozole therapy  INTERVAL HISTORY: Sheila Jacobs is a 67 year old with above-mentioned history left breast cancer treated with lumpectomy radiation and is currently on anastrozole therapy.  She reports muscle aches and pains and stiffness which have been there even prior to starting antiestrogen therapy.  She is also concerned with her weight gain.  She has not been exercising and not watching her diet.  She tells me that she stays at home and that prompts her to eat constantly.  She was in the emergency room with chest pain and she attributes it to her thyroid medication.  REVIEW OF SYSTEMS:   Constitutional: Denies fevers, chills or abnormal  weight loss Eyes: Denies blurriness of vision Ears, nose, mouth, throat, and face: Denies mucositis or sore throat Respiratory: Denies cough, dyspnea or wheezes Cardiovascular: Denies palpitation, chest discomfort Gastrointestinal:  Denies nausea, heartburn or change in bowel habits Skin: Denies abnormal skin rashes Lymphatics: Denies new lymphadenopathy or easy bruising Neurological:Denies numbness, tingling or new weaknesses Behavioral/Psych: Mood is stable, no new changes  Extremities: No lower extremity edema Breast:  denies any pain or lumps or nodules in either breasts All other systems were reviewed with the patient and are negative.  I have reviewed the past medical history, past surgical history, social history and family history with the patient and they are unchanged from previous note.  ALLERGIES:  is allergic to cortisone; effexor [venlafaxine]; and lexapro [escitalopram].  MEDICATIONS:  Current Outpatient Medications  Medication Sig Dispense Refill  . ALPRAZolam (XANAX) 0.25 MG tablet TAKE ONE TABLET BY MOUTH TWICE DAILY AS NEEDED FOR ANXIETY 30 tablet 1  . anastrozole (ARIMIDEX) 1 MG tablet TAKE 1 TABLET BY MOUTH ONCE DAILY 90 tablet 2  . aspirin EC 81 MG tablet Take 81 mg by mouth daily.    . hydrochlorothiazide (HYDRODIURIL) 25 MG tablet Take 25 mg by mouth daily.    Marland Kitchen ketoconazole (NIZORAL) 2 % cream APPLY TOPICALLY DAILY AS NEEDED FOR IRRITATION 30 g 0  . levothyroxine (SYNTHROID, LEVOTHROID) 150 MCG tablet Take 1 tablet (150 mcg total) by mouth daily. 30 tablet 3  . pantoprazole (PROTONIX) 40 MG tablet  TAKE ONE TABLET BY MOUTH ONCE DAILY 30 tablet 11   No current facility-administered medications for this visit.     PHYSICAL EXAMINATION: ECOG PERFORMANCE STATUS: 1 - Symptomatic but completely ambulatory  Vitals:   11/25/17 1042  BP: (!) 139/53  Pulse: 63  Resp: 18  Temp: 98.2 F (36.8 C)  SpO2: 97%   Filed Weights   11/25/17 1042  Weight: (!) 309 lb  4.8 oz (140.3 kg)    GENERAL:alert, no distress and comfortable SKIN: skin color, texture, turgor are normal, no rashes or significant lesions EYES: normal, Conjunctiva are pink and non-injected, sclera clear OROPHARYNX:no exudate, no erythema and lips, buccal mucosa, and tongue normal  NECK: supple, thyroid normal size, non-tender, without nodularity LYMPH:  no palpable lymphadenopathy in the cervical, axillary or inguinal LUNGS: clear to auscultation and percussion with normal breathing effort HEART: regular rate & rhythm and no murmurs and no lower extremity edema ABDOMEN:abdomen soft, non-tender and normal bowel sounds MUSCULOSKELETAL:no cyanosis of digits and no clubbing  NEURO: alert & oriented x 3 with fluent speech, no focal motor/sensory deficits EXTREMITIES: No lower extremity edema BREAST: No palpable masses or nodules in either right or left breasts. No palpable axillary supraclavicular or infraclavicular adenopathy no breast tenderness or nipple discharge. (exam performed in the presence of a chaperone)  LABORATORY DATA:  I have reviewed the data as listed CMP Latest Ref Rng & Units 10/06/2017 08/29/2017 02/25/2017  Glucose 65 - 99 mg/dL 98 101(H) 105(H)  BUN 6 - 20 mg/dL _0 Creatinine 0.44 - 1.00 mg/dL 0.81 0.83 0.75  Sodium 135 - 145 mmol/L 139 138 138  Potassium 3.5 - 5.1 mmol/L 3.5 3.9 4.4  Chloride 101 - 111 mmol/L 104 103 103  CO2 22 - 32 mmol/L 23 32 30  Calcium 8.9 - 10.3 mg/dL 9.5 9.4 9.7  Total Protein 6.4 - 8.3 g/dL - - -  Total Bilirubin 0.20 - 1.20 mg/dL - - -  Alkaline Phos 40 - 150 U/L - - -  AST 5 - 34 U/L - - -  ALT 0 - 55 U/L - - -    Lab Results  Component Value Date   WBC 4.7 11/25/2017   HGB 13.2 10/06/2017   HCT 36.8 11/25/2017   MCV 97.9 11/25/2017   PLT 238 11/25/2017   NEUTROABS 2.3 11/25/2017    ASSESSMENT & PLAN:  Breast cancer of upper-outer quadrant of left female breast ([place]) Left lumpectomy 04/11/2016: IDC grade 3, 1.2 cm,  IG DCIS, LVI present, 0/8 lymph nodes negative, ER 100%, PR 90%, HER-2 negative, Ki-67 10%, T1 cN0 stage I a pathologic stage  Adj XRT 06/18/16 to 07/30/16  Treatment Plan: Adj Anti-estrogen therapy with Letrozole 2.5 mg daily started 08/29/2016  Letrozole toxicities: Denies any hot flashes. Patient is significantly obese and has not been exercising or cutting down her food intake. I believe this is the reason why she hurts all over and has difficulty with ambulation. She blames her thyroid medication for the symptoms.  I do not believe it is her thyroid medicine at all.  Breast cancer surveillance: 1.  Breast exam 11/25/2017: Benign  2. mammogram 05/29/2017: Density category B, no evidence of breast cancer  Return to clinic in 1 year for follow-up      I spent 25 minutes talking to the patient of which more than half was spent in counseling and coordination of care.  No orders of the defined types were placed in this encounter.  The patient has a good understanding of the overall plan. she agrees with it. she will call with any problems that may develop before the next visit here.   Harriette Ohara, MD 11/25/17

## 2017-11-25 NOTE — Assessment & Plan Note (Signed)
Left lumpectomy 04/11/2016: IDC grade 3, 1.2 cm, IG DCIS, LVI present, 0/8 lymph nodes negative, ER 100%, PR 90%, HER-2 negative, Ki-67 10%, T1 cN0 stage I a pathologic stage  Adj XRT 06/18/16 to 07/30/16  Treatment Plan: Adj Anti-estrogen therapy with Letrozole 2.5 mg daily started 08/29/2018  Letrozole toxicities:  Breast cancer surveillance: 1.  Breast exam 11/25/2017: Benign  2. mammogram 05/29/2017: Density category B, no evidence of breast cancer  Return to clinic in 1 year for follow-up

## 2017-11-26 ENCOUNTER — Telehealth: Payer: Self-pay | Admitting: Hematology and Oncology

## 2017-11-26 NOTE — Telephone Encounter (Signed)
Mailed patient calendar of upcoming march 2020 appointments.

## 2017-12-28 ENCOUNTER — Encounter (HOSPITAL_COMMUNITY): Payer: Self-pay

## 2017-12-28 ENCOUNTER — Emergency Department (HOSPITAL_COMMUNITY): Payer: Medicare Other

## 2017-12-28 ENCOUNTER — Inpatient Hospital Stay (HOSPITAL_COMMUNITY)
Admission: EM | Admit: 2017-12-28 | Discharge: 2018-01-03 | DRG: 175 | Disposition: A | Discharge status: Home Health | Payer: Medicare Other | Attending: Internal Medicine | Admitting: Internal Medicine

## 2017-12-28 DIAGNOSIS — Z87891 Personal history of nicotine dependence: Secondary | ICD-10-CM

## 2017-12-28 DIAGNOSIS — E039 Hypothyroidism, unspecified: Secondary | ICD-10-CM

## 2017-12-28 DIAGNOSIS — R0902 Hypoxemia: Secondary | ICD-10-CM

## 2017-12-28 DIAGNOSIS — Z8639 Personal history of other endocrine, nutritional and metabolic disease: Secondary | ICD-10-CM

## 2017-12-28 DIAGNOSIS — G4733 Obstructive sleep apnea (adult) (pediatric): Secondary | ICD-10-CM | POA: Diagnosis present

## 2017-12-28 DIAGNOSIS — R0602 Shortness of breath: Secondary | ICD-10-CM

## 2017-12-28 DIAGNOSIS — J9601 Acute respiratory failure with hypoxia: Secondary | ICD-10-CM | POA: Diagnosis present

## 2017-12-28 DIAGNOSIS — I2721 Secondary pulmonary arterial hypertension: Secondary | ICD-10-CM | POA: Diagnosis present

## 2017-12-28 DIAGNOSIS — R739 Hyperglycemia, unspecified: Secondary | ICD-10-CM | POA: Diagnosis present

## 2017-12-28 DIAGNOSIS — Z6841 Body Mass Index (BMI) 40.0 and over, adult: Secondary | ICD-10-CM

## 2017-12-28 DIAGNOSIS — H9190 Unspecified hearing loss, unspecified ear: Secondary | ICD-10-CM | POA: Diagnosis present

## 2017-12-28 DIAGNOSIS — R402413 Glasgow coma scale score 13-15, at hospital admission: Secondary | ICD-10-CM | POA: Diagnosis present

## 2017-12-28 DIAGNOSIS — Z888 Allergy status to other drugs, medicaments and biological substances status: Secondary | ICD-10-CM

## 2017-12-28 DIAGNOSIS — Z79811 Long term (current) use of aromatase inhibitors: Secondary | ICD-10-CM

## 2017-12-28 DIAGNOSIS — I2602 Saddle embolus of pulmonary artery with acute cor pulmonale: Secondary | ICD-10-CM | POA: Diagnosis not present

## 2017-12-28 DIAGNOSIS — M1990 Unspecified osteoarthritis, unspecified site: Secondary | ICD-10-CM | POA: Diagnosis present

## 2017-12-28 DIAGNOSIS — R001 Bradycardia, unspecified: Secondary | ICD-10-CM | POA: Diagnosis present

## 2017-12-28 DIAGNOSIS — I2699 Other pulmonary embolism without acute cor pulmonale: Secondary | ICD-10-CM

## 2017-12-28 DIAGNOSIS — C50412 Malignant neoplasm of upper-outer quadrant of left female breast: Secondary | ICD-10-CM | POA: Diagnosis present

## 2017-12-28 DIAGNOSIS — K219 Gastro-esophageal reflux disease without esophagitis: Secondary | ICD-10-CM | POA: Diagnosis present

## 2017-12-28 DIAGNOSIS — E890 Postprocedural hypothyroidism: Secondary | ICD-10-CM | POA: Diagnosis present

## 2017-12-28 DIAGNOSIS — Z923 Personal history of irradiation: Secondary | ICD-10-CM

## 2017-12-28 DIAGNOSIS — I509 Heart failure, unspecified: Secondary | ICD-10-CM

## 2017-12-28 DIAGNOSIS — I2692 Saddle embolus of pulmonary artery without acute cor pulmonale: Secondary | ICD-10-CM

## 2017-12-28 DIAGNOSIS — E872 Acidosis: Secondary | ICD-10-CM | POA: Diagnosis present

## 2017-12-28 DIAGNOSIS — Z9981 Dependence on supplemental oxygen: Secondary | ICD-10-CM

## 2017-12-28 DIAGNOSIS — E876 Hypokalemia: Secondary | ICD-10-CM | POA: Diagnosis present

## 2017-12-28 DIAGNOSIS — Z79899 Other long term (current) drug therapy: Secondary | ICD-10-CM

## 2017-12-28 DIAGNOSIS — R2681 Unsteadiness on feet: Secondary | ICD-10-CM | POA: Diagnosis present

## 2017-12-28 DIAGNOSIS — Z7982 Long term (current) use of aspirin: Secondary | ICD-10-CM

## 2017-12-28 DIAGNOSIS — F419 Anxiety disorder, unspecified: Secondary | ICD-10-CM | POA: Diagnosis present

## 2017-12-28 DIAGNOSIS — I10 Essential (primary) hypertension: Secondary | ICD-10-CM | POA: Diagnosis present

## 2017-12-28 DIAGNOSIS — R7989 Other specified abnormal findings of blood chemistry: Secondary | ICD-10-CM | POA: Diagnosis present

## 2017-12-28 LAB — CBC WITH DIFFERENTIAL/PLATELET
BASOS ABS: 0 10*3/uL (ref 0.0–0.1)
BASOS PCT: 0 %
Eosinophils Absolute: 0 10*3/uL (ref 0.0–0.7)
Eosinophils Relative: 0 %
HEMATOCRIT: 36.9 % (ref 36.0–46.0)
HEMOGLOBIN: 12.4 g/dL (ref 12.0–15.0)
LYMPHS PCT: 21 %
Lymphs Abs: 1.5 10*3/uL (ref 0.7–4.0)
MCH: 32.4 pg (ref 26.0–34.0)
MCHC: 33.6 g/dL (ref 30.0–36.0)
MCV: 96.3 fL (ref 78.0–100.0)
MONO ABS: 0.4 10*3/uL (ref 0.1–1.0)
Monocytes Relative: 6 %
NEUTROS ABS: 5 10*3/uL (ref 1.7–7.7)
NEUTROS PCT: 73 %
Platelets: 161 10*3/uL (ref 150–400)
RBC: 3.83 MIL/uL — AB (ref 3.87–5.11)
RDW: 13.3 % (ref 11.5–15.5)
WBC: 6.9 10*3/uL (ref 4.0–10.5)

## 2017-12-28 LAB — COMPREHENSIVE METABOLIC PANEL
ALT: 11 U/L — ABNORMAL LOW (ref 14–54)
AST: 33 U/L (ref 15–41)
Albumin: 3.6 g/dL (ref 3.5–5.0)
Alkaline Phosphatase: 63 U/L (ref 38–126)
Anion gap: 15 (ref 5–15)
BUN: 11 mg/dL (ref 6–20)
CO2: 22 mmol/L (ref 22–32)
Calcium: 9.4 mg/dL (ref 8.9–10.3)
Chloride: 100 mmol/L — ABNORMAL LOW (ref 101–111)
Creatinine, Ser: 1.07 mg/dL — ABNORMAL HIGH (ref 0.44–1.00)
GFR calc Af Amer: 60 mL/min — ABNORMAL LOW (ref >60)
GFR calc non Af Amer: 52 mL/min — ABNORMAL LOW (ref >60)
Glucose, Bld: 183 mg/dL — ABNORMAL HIGH (ref 65–99)
Potassium: 3 mmol/L — ABNORMAL LOW (ref 3.5–5.1)
Sodium: 137 mmol/L (ref 135–145)
Total Bilirubin: 1.2 mg/dL (ref 0.3–1.2)
Total Protein: 8 g/dL (ref 6.5–8.1)

## 2017-12-28 LAB — I-STAT TROPONIN, ED: Troponin i, poc: 0.77 ng/mL (ref 0.00–0.08)

## 2017-12-28 LAB — TSH: TSH: 12.472 u[IU]/mL — AB (ref 0.350–4.500)

## 2017-12-28 LAB — I-STAT CG4 LACTIC ACID, ED
LACTIC ACID, VENOUS: 2.56 mmol/L — AB (ref 0.5–1.9)
Lactic Acid, Venous: 3.56 mmol/L (ref 0.5–1.9)

## 2017-12-28 LAB — BRAIN NATRIURETIC PEPTIDE: B NATRIURETIC PEPTIDE 5: 592.6 pg/mL — AB (ref 0.0–100.0)

## 2017-12-28 IMAGING — CR DG CHEST 2V
2 series · 2 of 2 positions shown · non-contrast
Comparison: [DATE]

CLINICAL DATA: Increasing shortness of breath

EXAM:
CHEST - 2 VIEW

[chest lat]
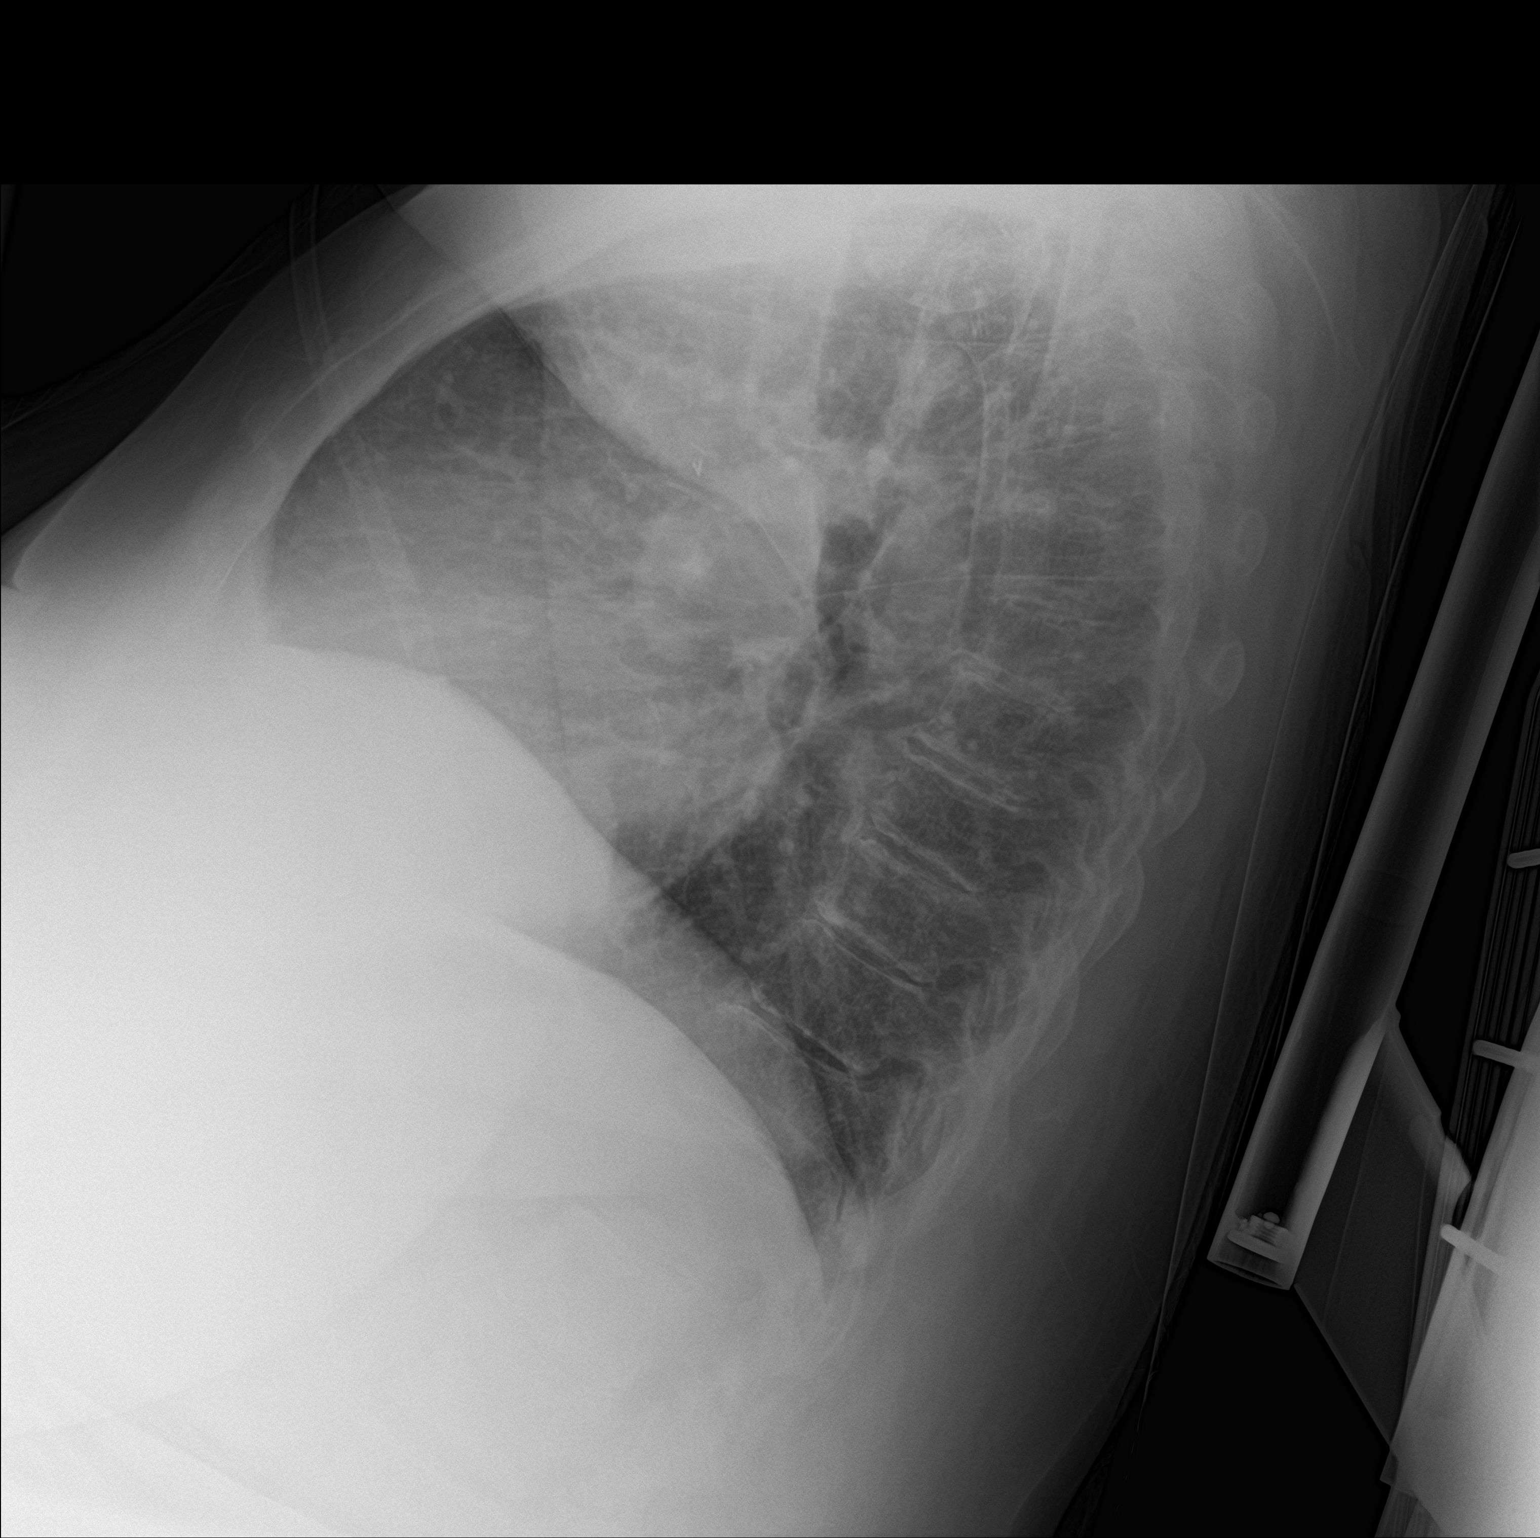

[chest ap]
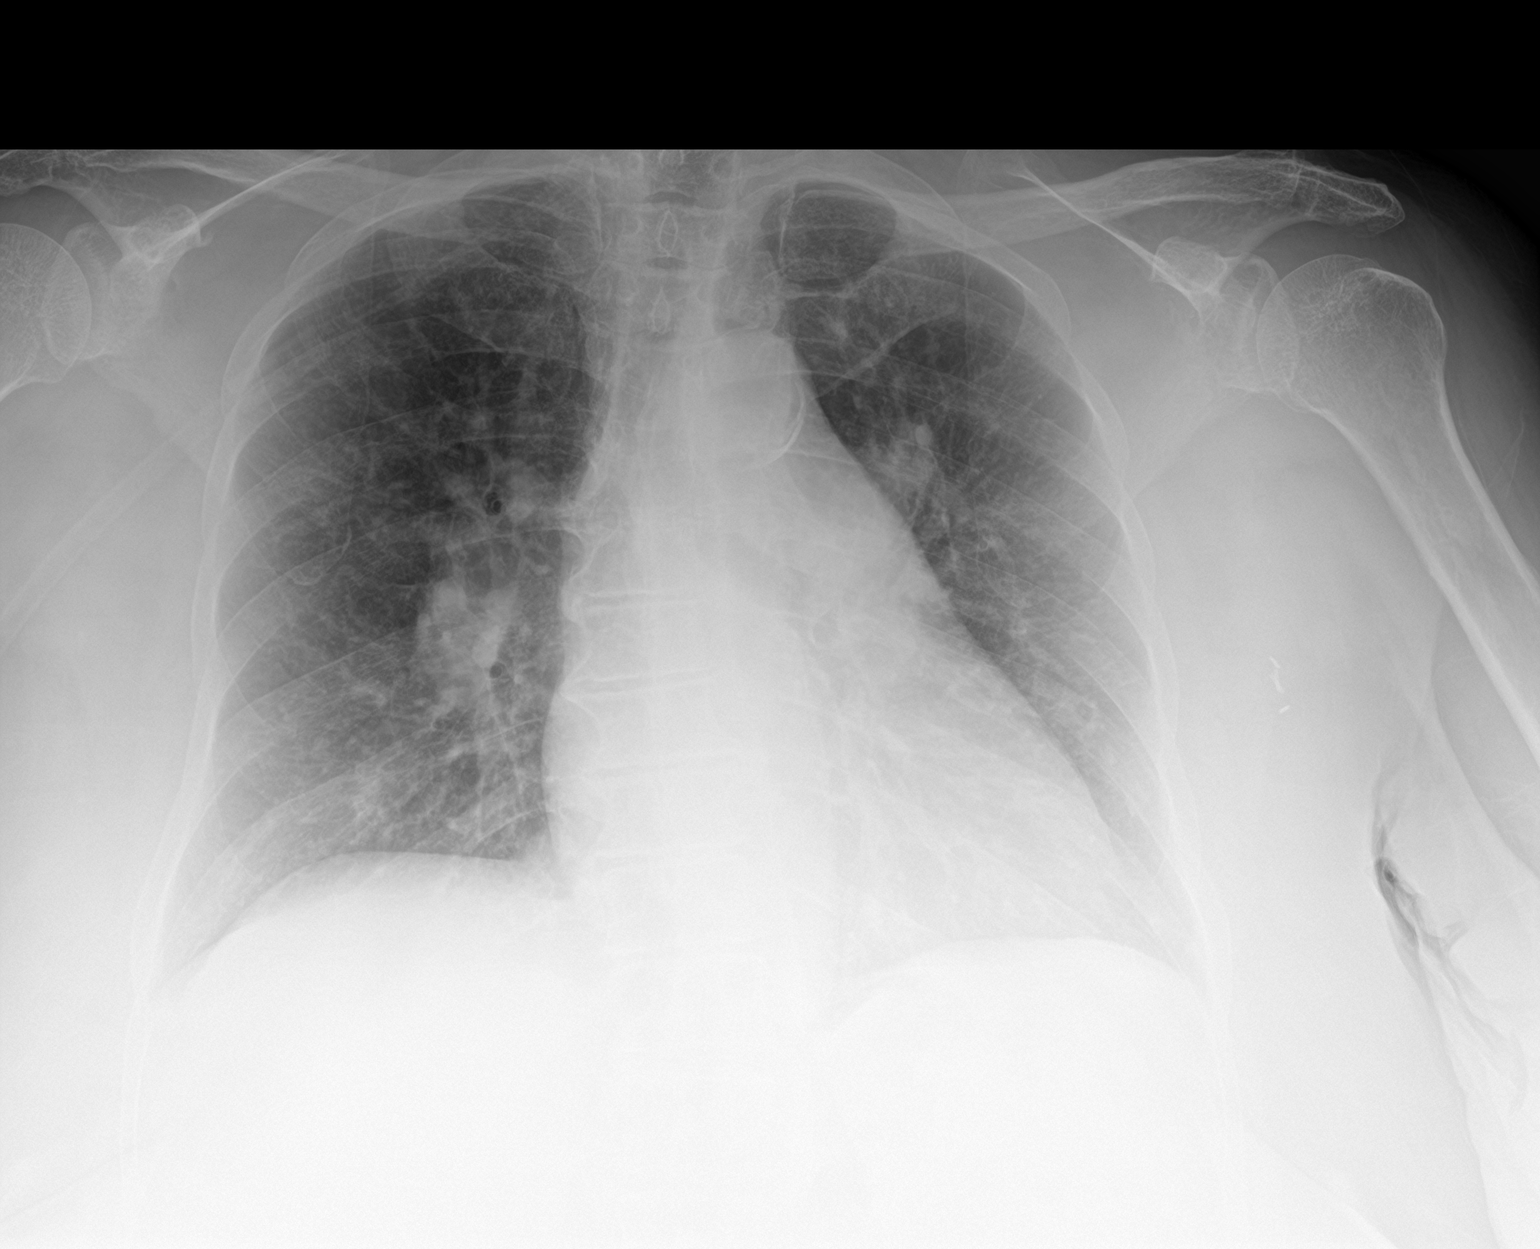

[2 of 2 positions shown; findings below may reference images not displayed]

FINDINGS: Cardiac shadow is stable. Aortic calcifications are again seen. Mild
increased vascular congestion is noted without interstitial edema.
The lungs are well aerated bilaterally. No focal infiltrate or
sizable effusion is seen. Mild degenerative changes of the thoracic
spine are noted.
IMPRESSION: Mild increased vascular congestion without edema. No other focal
abnormality is seen.

## 2017-12-28 MED ORDER — ASPIRIN 81 MG PO CHEW
243.0000 mg | CHEWABLE_TABLET | Freq: Once | ORAL | Status: AC
Start: 2017-12-28 — End: 2017-12-28
  Administered 2017-12-28: 243 mg via ORAL
  Filled 2017-12-28: qty 3

## 2017-12-28 MED ORDER — POTASSIUM CHLORIDE CRYS ER 20 MEQ PO TBCR
80.0000 meq | EXTENDED_RELEASE_TABLET | Freq: Once | ORAL | Status: AC
  Administered 2017-12-29: 80 meq via ORAL
  Filled 2017-12-28: qty 4

## 2017-12-28 MED ORDER — FUROSEMIDE 10 MG/ML IJ SOLN
40.0000 mg | Freq: Once | INTRAMUSCULAR | Status: AC
  Administered 2017-12-29: 40 mg via INTRAVENOUS
  Filled 2017-12-28: qty 4

## 2017-12-28 NOTE — ED Notes (Signed)
ED Provider at bedside. 

## 2017-12-28 NOTE — ED Triage Notes (Signed)
Pt reports that she believes he symptoms are due to her thyroid medication.

## 2017-12-28 NOTE — ED Provider Notes (Signed)
[facility] EMERGENCY DEPARTMENT Provider Note   CSN: [id number] Arrival date & time: 12/28/17  2109     History   Chief Complaint Chief Complaint  Patient presents with  . Shortness of Breath    HPI WINNONA WARGO is a 67 y.o. female with a PMHx of breast cancer s/p radiation and resection currently on anastrazole, GERD, HTN, HLD, thyroid goiter s/p thyroidectomy with resultant hypothyroidism, and other conditions listed below, who presents to the ED with complaints of shortness of breath for the last 6 days.  Level 5 caveat due to patient being a very poor historian and having difficulty explaining her acute symptoms, repeatedly talks about her thyroid medication causing problems over the last 1 year, but not directly related to her symptoms this week.  She states that over the last 6 days she has felt short of breath and had a cough with white sputum production.  She states that when she coughs she has 5/10 intermittent scratchy central chest pain which is nonradiating, only worse/occuring with coughing, and unrelieved with Tylenol.  She has not taken anything else for her symptoms.  She states that when she has coughing spells she gets lightheaded.  She also states that she randomly has sweats throughout the day which she says are "just little sweats" and nothing very severe, and not related specifically to her cough-related chest pain or with the shortness of breath.  She states that her chest pain only happens when she coughs and otherwise she is chest pain-free.  Her cardiologist is Dr. [name] and her PCP was previously at [facility] however she's in the process of changing PCPs.  She is a nonsmoker and has no known FHx of cardiac disease.  Of note, she mentions having some plantar foot pain in her R foot but saw her PCP and was told it was "nothing". States this has been going on for "a while" and is not acutely different this week.  She lives a fairly sedentary  life.  She takes '81mg'$  ASA daily, which she took today. Pt is not normally on O2.   She denies fevers, chills, other URI symptoms, new/worsening LE swelling, hemoptysis, recent travel/surgery/immobilization, estrogen use, personal/family hx of DVT/PE, abd pain, N/V/D/C, hematuria, dysuria, leg pain/myalgias/arthralgias, claudication, orthopnea, numbness, tingling, focal weakness, or any other complaints at this time.   The history is provided by the patient and medical records. No language interpreter was used.  Shortness of Breath  Associated symptoms include cough and chest pain (only with cough). Pertinent negatives include no fever, no rhinorrhea, no sore throat, no ear pain, no vomiting, no abdominal pain and no leg swelling (nothing new/different).    Past Medical History:  Diagnosis Date  . Anginal pain ([place])   . Anxiety   . Arthritis   . Breast cancer ([place])   . Breast cancer of upper-outer quadrant of left female breast ([place]) 01/31/2016  . Colon polyps   . Enlarged LA (left atrium)   . GERD (gastroesophageal reflux disease)   . Hard of hearing    Wears hearing aids.  . Headache   . Heavy cigarette smoker    Quit 2015  . History of radiation therapy 06/18/16- 07/30/16   Left Breast 45 Gy in 25 fractions, Left Breast Boost 6 Gy in 3 fractions.   . Hyperlipidemia   . Hypertension   . Morbid obesity ([place])   . Numbness and tingling   . Obesity   . Thyroid goiter   .  Vertigo    on occasion    Patient Active Problem List   Diagnosis Date Noted  . History of breast cancer 10/23/2017  . Post-surgical hypothyroidism 06/27/2017  . Thyroid goiter 02/26/2017  . Palpitations 10/16/2016  . Personal history of noncompliance with medical treatment, presenting hazards to health 08/27/2016  . Generalized anxiety disorder 08/09/2016  . Abnormal TSH 05/09/2016  . Paresthesia 01/31/2016  . Abnormality of gait 01/31/2016  . Low back pain 01/31/2016  . Breast cancer of upper-outer  quadrant of left female breast ([place]) 01/31/2016  . Former heavy cigarette smoker (20-39 per day) 11/08/2015  . Goiter 11/08/2015  . Esophageal reflux   . Special screening for malignant neoplasms, colon   . Hearing loss 10/07/2015  . GERD (gastroesophageal reflux disease) 05/03/2015  . Dependent edema 05/03/2015  . Morbid obesity ([place]) 04/29/2015  . Right carotid bruit 10/06/2014  . Hypokalemia   . Hypomagnesemia   . Atypical chest pain 09/18/2014  . Hypertension 09/18/2014  . Hyperlipidemia 09/18/2014  . Enlarged LA (left atrium)   . Chest pain, precordial 09/02/2014    Past Surgical History:  Procedure Laterality Date  . BREAST LUMPECTOMY Left 2017   radiation  . BREAST LUMPECTOMY WITH RADIOACTIVE SEED AND SENTINEL LYMPH NODE BIOPSY Left 04/11/2016   Procedure: BREAST LUMPECTOMY WITH RADIOACTIVE SEED AND SENTINEL LYMPH NODE BIOPSY;  Surgeon: Excell Seltzer, MD;  Location: [facility];  Service: General;  Laterality: Left;  . COLONOSCOPY WITH PROPOFOL N/A 10/20/2015   Procedure: COLONOSCOPY WITH PROPOFOL;  Surgeon: Mauri Pole, MD;  Location: [facility];  Service: Endoscopy;  Laterality: N/A;  . ESOPHAGOGASTRODUODENOSCOPY (EGD) WITH PROPOFOL N/A 10/20/2015   Procedure: ESOPHAGOGASTRODUODENOSCOPY (EGD) WITH PROPOFOL;  Surgeon: Mauri Pole, MD;  Location: [facility];  Service: Endoscopy;  Laterality: N/A;  . LEFT HEART CATHETERIZATION WITH CORONARY ANGIOGRAM N/A 10/21/2014   Procedure: LEFT HEART CATHETERIZATION WITH CORONARY ANGIOGRAM;  Surgeon: Burnell Blanks, MD;  Location: [facility] CATH LAB;  Service: Cardiovascular;  Laterality: N/A;  . THYROIDECTOMY N/A 02/26/2017   Procedure: TOTAL THYROIDECTOMY;  Surgeon: Excell Seltzer, MD;  Location: [facility];  Service: General;  Laterality: N/A;  . TUBAL LIGATION       OB History   None      Home Medications    Prior to Admission medications   Medication Sig Start Date End Date Taking? Authorizing Provider    ALPRAZolam (XANAX) 0.25 MG tablet TAKE ONE TABLET BY MOUTH TWICE DAILY AS NEEDED FOR ANXIETY 01/07/17   Henson, Vickie L, NP-C  anastrozole (ARIMIDEX) 1 MG tablet Take 1 tablet (1 mg total) by mouth daily. 11/25/17   Nicholas Lose, MD  aspirin EC 81 MG tablet Take 81 mg by mouth daily.    [provider]  hydrochlorothiazide (HYDRODIURIL) 25 MG tablet Take 25 mg by mouth daily.    [provider]  ketoconazole (NIZORAL) 2 % cream APPLY TOPICALLY DAILY AS NEEDED FOR IRRITATION 01/07/17   Wallene Huh, DPM  levothyroxine (SYNTHROID, LEVOTHROID) 150 MCG tablet Take 1 tablet (150 mcg total) by mouth daily. 08/29/17   Elayne Snare, MD  pantoprazole (PROTONIX) 40 MG tablet TAKE ONE TABLET BY MOUTH ONCE DAILY 10/28/17   Burtis Junes, NP    Family History Family History  Problem Relation Age of Onset  . Hypertension Mother   . Heart disease Mother        Pacemaker  . Kidney disease Mother   . Cataracts Mother   . Hearing loss Mother   .  Diabetes Mother   . Liver disease Mother   . Diabetes Sister   . Cataracts Brother   . Hearing loss Brother   . Colon cancer Neg Hx   . Thyroid disease Neg Hx     Social History Social History   Tobacco Use  . Smoking status: Former Smoker    Packs/day: 1.00    Years: 45.00    Pack years: 45.00    Types: Cigarettes    Last attempt to quit: 07/11/2014    Years since quitting: 3.4  . Smokeless tobacco: Never Used  Substance Use Topics  . Alcohol use: No    Alcohol/week: 0.0 oz  . Drug use: No     Allergies   Cortisone; Effexor [venlafaxine]; and Lexapro [escitalopram]   Review of Systems Review of Systems  Constitutional: Positive for diaphoresis (sweats randomly). Negative for chills and fever.  HENT: Negative for ear discharge, ear pain, rhinorrhea and sore throat.   Respiratory: Positive for cough and shortness of breath.   Cardiovascular: Positive for chest pain (only with cough). Negative for leg swelling (nothing  new/different).  Gastrointestinal: Negative for abdominal pain, constipation, diarrhea, nausea and vomiting.  Genitourinary: Negative for dysuria and hematuria.  Musculoskeletal: Negative for arthralgias and myalgias.  Skin: Negative for color change.  Allergic/Immunologic: Negative for immunocompromised state.  Neurological: Positive for light-headedness (with coughing). Negative for weakness and numbness.  Psychiatric/Behavioral: Negative for confusion.   All other systems reviewed and are negative for acute change except as noted in the HPI.    Physical Exam Updated Vital Signs BP 119/72 (BP Location: Right Arm)   Pulse 95   Temp 97.9 F (36.6 C) (Oral)   Resp 20   SpO2 91%   Physical Exam  Constitutional: She is oriented to person, place, and time. Vital signs are normal. She appears well-developed and well-nourished.  Non-toxic appearance. No distress.  Afebrile, nontoxic, NAD, comfortable on 5L O2 via [place] however apparently was slightly tachycardic and hypoxic in the 70s-80s before O2 was placed.   HENT:  Head: Normocephalic and atraumatic.  Mouth/Throat: Oropharynx is clear and moist and mucous membranes are normal.  Eyes: Conjunctivae and EOM are normal. Right eye exhibits no discharge. Left eye exhibits no discharge.  Neck: Normal range of motion. Neck supple.  Cardiovascular: Normal rate, regular rhythm, normal heart sounds and intact distal pulses. Exam reveals no gallop and no friction rub.  No murmur heard. Initially tachycardic in the 100-110s before O2 was placed, however on exam she has a RRR, nl s1/s2, no m/r/g, distal pulses intact, trace b/l pedal edema perhaps slightly more in RLE compared to LLE but difficult to tell due to body habitus  Pulmonary/Chest: Effort normal and breath sounds normal. No respiratory distress. She has no decreased breath sounds. She has no wheezes. She has no rhonchi. She has no rales. She exhibits no tenderness, no crepitus, no deformity  and no retraction.  Body habitus severely limits evaluation, but overall CTAB in all lung fields, no w/r/r appreciated, hypoxic initially in the lower 70s-upper 80s but no hypoxia after 5L O2 via [place] placed; no increased WOB (again, this is after O2 was placed), speaking in mostly full sentences, SpO2 95-98% on 4.5-5L via [place].  Chest wall nonTTP without crepitus, deformities, or retractions   Abdominal: Soft. Normal appearance and bowel sounds are normal. She exhibits no distension. There is no tenderness. There is no rigidity, no rebound, no guarding, no CVA tenderness, no tenderness at McBurney's point and  negative Murphy's sign.  Musculoskeletal: Normal range of motion.  MAE x4 Strength and sensation grossly intact in all extremities Distal pulses intact Neg homan's bilaterally, trace pedal edema as previously mentioned  Neurological: She is alert and oriented to person, place, and time. She has normal strength. No sensory deficit.  Skin: Skin is warm, dry and intact. No rash noted.  Psychiatric: She has a normal mood and affect.  Nursing note and vitals reviewed.    ED Treatments / Results  Labs (all labs ordered are listed, but only abnormal results are displayed) Labs Reviewed  COMPREHENSIVE METABOLIC PANEL - Abnormal; Notable for the following components:      Result Value   Potassium 3.0 (*)    Chloride 100 (*)    Glucose, Bld 183 (*)    Creatinine, Ser 1.07 (*)    ALT 11 (*)    GFR calc non Af Amer 52 (*)    GFR calc Af Amer 60 (*)    All other components within normal limits  CBC WITH DIFFERENTIAL/PLATELET - Abnormal; Notable for the following components:   RBC 3.83 (*)    All other components within normal limits  URINALYSIS, ROUTINE W REFLEX MICROSCOPIC - Abnormal; Notable for the following components:   Color, Urine STRAW (*)    Specific Gravity, Urine 1.004 (*)    Hgb urine dipstick SMALL (*)    Bacteria, UA RARE (*)    Squamous Epithelial / LPF 0-5 (*)    All other  components within normal limits  BRAIN NATRIURETIC PEPTIDE - Abnormal; Notable for the following components:   B Natriuretic Peptide 592.6 (*)    All other components within normal limits  TSH - Abnormal; Notable for the following components:   TSH 12.472 (*)    All other components within normal limits  D-DIMER, QUANTITATIVE (NOT AT [facility]) - Abnormal; Notable for the following components:   D-Dimer, Quant >20.00 (*)    All other components within normal limits  I-STAT CG4 LACTIC ACID, ED - Abnormal; Notable for the following components:   Lactic Acid, Venous 3.56 (*)    All other components within normal limits  I-STAT TROPONIN, ED - Abnormal; Notable for the following components:   Troponin i, poc 0.77 (*)    All other components within normal limits  I-STAT CG4 LACTIC ACID, ED - Abnormal; Notable for the following components:   Lactic Acid, Venous 2.56 (*)    All other components within normal limits  I-STAT VENOUS BLOOD GAS, ED - Abnormal; Notable for the following components:   pH, Ven 7.444 (*)    pCO2, Ven 39.5 (*)    pO2, Ven 27.0 (*)    Acid-Base Excess 3.0 (*)    All other components within normal limits    EKG EKG Interpretation  Date/Time:  Saturday December 28 2017 21:15:01 EDT Ventricular Rate:  106 PR Interval:  178 QRS Duration: 88 QT Interval:  332 QTC Calculation: 441 R Axis:   -73 Text Interpretation:  Sinus tachycardia Left axis deviation Possible Anterior infarct , age undetermined Abnormal ECG Since last tracing rate faster Confirmed by Dorie Rank [phone]) on 12/28/2017 9:17:55 PM   Radiology Dg Chest 2 View  Result Date: 12/28/2017 CLINICAL DATA:  Increasing shortness of breath EXAM: CHEST - 2 VIEW COMPARISON:  10/06/2017 FINDINGS: Cardiac shadow is stable. Aortic calcifications are again seen. Mild increased vascular congestion is noted without interstitial edema. The lungs are well aerated bilaterally. No focal infiltrate or sizable effusion is  seen. Mild  degenerative changes of the thoracic spine are noted. IMPRESSION: Mild increased vascular congestion without edema. No other focal abnormality is seen. Electronically Signed   By: Inez Catalina M.D.   On: 12/28/2017 22:01   Ct Angio Chest Pe W And/or Wo Contrast  Result Date: 12/29/2017 CLINICAL DATA:  Shortness of breath and chest pain EXAM: CT ANGIOGRAPHY CHEST WITH CONTRAST TECHNIQUE: Multidetector CT imaging of the chest was performed using the standard protocol during bolus administration of intravenous contrast. Multiplanar CT image reconstructions and MIPs were obtained to evaluate the vascular anatomy. CONTRAST:  155m ISOVUE-370 IOPAMIDOL (ISOVUE-370) INJECTION 76% COMPARISON:  Chest radiograph 12/28/2017 FINDINGS: Cardiovascular: Contrast injection is sufficient to demonstrate satisfactory opacification of the pulmonary arteries to the segmental level.There is large volume pulmonary embolus within both distal main pulmonary arteries and extending into the lobar and segmental branches. There is evidence of right heart strain with an RV/LV ratio of approximately 3. The main pulmonary artery is enlarged, measuring 4 cm at the bifurcation. There is a normal 3-vessel arch branching pattern without evidence of acute aortic syndrome. There is mildaortic atherosclerosis. Heart size is normal, without pericardial effusion. Mediastinum/Nodes: No mediastinal, hilar or axillary lymphadenopathy. The visualized thyroid and thoracic esophageal course are unremarkable. Lungs/Pleura: No pulmonary nodules or masses. No pleural effusion or pneumothorax. No focal airspace consolidation. No focal pleural abnormality. Upper Abdomen: Contrast bolus timing is not optimized for evaluation of the abdominal organs. Within this limitation, the visualized organs of the upper abdomen are normal. Musculoskeletal: No chest wall abnormality. No acute or significant osseous findings. Review of the MIP images confirms the above findings.  IMPRESSION: 1. Positive for acute bilateral main pulmonary artery embolus with CT evidence of right heart strain (RV/LV Ratio = 3), consistent with at least submassive (intermediate risk) PE. Large volume emboli extend into the lobar and segmental arteries of both lungs. The presence of right heart strain has been associated with an increased risk of morbidity and mortality. Please activate Code PE by paging [phone] 2.  Aortic Atherosclerosis (ICD10-I70.0). Critical Value/emergent results were called by telephone at the time of interpretation on 12/29/2017 at 3:07 am to [facility], who verbally acknowledged these results. Electronically Signed   By: KUlyses JarredM.D.   On: 12/29/2017 03:10    Echo 09/15/14: Study Conclusions - Left ventricle: The cavity size was normal. Systolic function was normal. The estimated ejection fraction was in the range of 60% to 65%. Wall motion was normal; there were no regional wall motion abnormalities. Indeterminate diastolic function. Normal filling pressures. Mild focal basal septal hypertrophy. - Aortic valve: Trileaflet; mildly thickened leaflets. - Mitral valve: There was mild regurgitation. - Left atrium: The atrium was severely dilated. Volume/bsa, S: 42.3 ml/m^2. - Right atrium: The atrium was mildly dilated. - Tricuspid valve: There was mild regurgitation. - Inferior vena cava: The vessel was small, appearing collapsed, consistent with low central venous pressure.   Myocardial Perfusion Stress Test 10/14/14: Overall Impression:  High risk stress nuclear study with a large, severe, reversible inferior defect consistent with severe inferior ischemia   LHC 10/21/14: Angiographic Findings: Left main: This vessel has an unusual proximal segment but appears to be widely patent. There is no dampening of the catheter on engagement. The cranial shots demonstrate good reflux around the catheter with streaming of contrast due to the shape of  the vessel. The ostium of this vessel does not appear to have atherosclerotic plaque.  Left Anterior Descending Artery: Large caliber  vessel that courses to the apex. There is a small caliber diagonal branch with no obstructive disease.  Circumflex Artery: Large caliber vessel with two moderate caliber obtuse marginal branches. No obstructive disease.  Right Coronary Artery: Small non-dominant vessel with no obstructive disease.  Left Ventricular Angiogram: LVEF=65%.   Impression: 1. No angiographic evidence of CAD 2. Normal LV systolic function 3. Non-cardiac chest pain   Procedures Procedures (including critical care time)  CRITICAL CARE Performed by: Reece Agar   Total critical care time: 55 minutes  Critical care time was exclusive of separately billable procedures and treating other patients.  Critical care was necessary to treat or prevent imminent or life-threatening deterioration.  Critical care was time spent personally by me on the following activities: development of treatment plan with patient and/or surrogate as well as nursing, discussions with consultants, evaluation of patient's response to treatment, examination of patient, obtaining history from patient or surrogate, ordering and performing treatments and interventions, ordering and review of laboratory studies, ordering and review of radiographic studies, pulse oximetry and re-evaluation of patient's condition.   Medications Ordered in ED Medications  iopamidol (ISOVUE-370) 76 % injection (has no administration in time range)  heparin bolus via infusion 5,000 Units (has no administration in time range)  heparin ADULT infusion 100 units/mL (25000 units/256m sodium chloride 0.45%) (has no administration in time range)  aspirin chewable tablet 243 mg (243 mg Oral Given 12/28/17 2315)  furosemide (LASIX) injection 40 mg (40 mg Intravenous Given 12/29/17 0016)  potassium chloride SA (K-DUR,KLOR-CON) CR tablet 80  mEq (80 mEq Oral Given 12/29/17 0016)  iopamidol (ISOVUE-370) 76 % injection 100 mL (100 mLs Intravenous Contrast Given 12/29/17 0244)     Initial Impression / Assessment and Plan / ED Course  I have reviewed the triage vital signs and the nursing notes.  Pertinent labs & imaging results that were available during my care of the patient were reviewed by me and considered in my medical decision making (see chart for details).     67y.o. female here with shortness of breath for the last 6 days with associated cough with white sputum production.  She is a very poor historian and has difficulty reporting her current symptoms, talks a lot about her thyroid medication causing problems over the last year but not directly related to her acute symptoms.  On arrival, she was hypoxic on room air down to the upper 70s-lower 80s, improved to the mid 90s with 5 L via nasal cannula which is what she is on during the exam; no wheezing, rhonchi, or rales appreciated in her lungs although body habitus does interfere somewhat with the exam.  She has trace pedal edema bilaterally, perhaps slightly worse on the right, but negative Homans sign.  She was initially tachycardic but that improved after they gave her oxygen, no persistent tachycardia after that.  She denies having any chest pain aside from when she coughs, and states she gets dizzy when she coughs.  She also states that she gets random sweats/diaphoresis but unrelated to her chest pain/SOB or coughing episodes.  Workup thus far reveals: Lactic elevated at 3.56 but CBC WNL and no other SIRS criteria met therefore doubt need for empiric fluids/abx at this point; troponin elevated at 0.77; CMP pending; chest x-ray with mild vascular congestion but no frank edema, no consolidation.  EKG with some TWI in LL3 and V1, somewhat different from prior EKGs. Chart review reveals that her last Echo and stress test were in  01-10/2014 and her Echo was unremarkable but her stress  test was high risk so she underwent LHC which was clear of any CAD and LV function WNL. DDx includes new-onset CHF with demand ischemia, PE, atypical PNA, etc. Awaiting CMP; will get BNP, D-dimer, TSH, give remainder of ASA to complete a '325mg'$  dose, and reassess. Will hold off on lasix until we get further testing back. Discussed case with my attending Dr. Thomasene Lot who agrees with plan.   11:58 PM CMP mildly low K 3.0, will replete now; gluc 183, Cr mildly elevated at 1.07, otherwise remainder of CMP is WNL. BNP elevated at 592.6 which would make sense for new-onset CHF and this could explain her symptoms. TSH elevated at 12.472, somewhat similar to prior. D-dimer pending. Repeat lactic down to 2.56. Will give lasix and Kdur and await D-dimer testing. Will also get VBG. She will need admission, but want to see what the D-dimer is in order to assess if CTA would be appropriate or if this is all just CHF.   1:16 AM D-dimer elevated at >20, will proceed with CT to eval for PE. VBG with marginally elevated pH at 7.444 but overall reassuring. Will continue to monitor and reassess shortly. Pt remains stable on 4.5L O2 via [place].   3:08 AM U/A without evidence of infection. Radiologist calling regarding CTA chest showing large saddle PE with R heart strain, heparin ordered, code PE consult called.   3:15 AM Dr. Oletta Darter of PCCM returning page, his team will be down here to evaluate the pt and decide if they want to proceed with thrombolysis/thrombectomy and whether they'd want to admit the pt or not; he asked that I await their recommendations before proceeding with any other consults regarding admission. Will continue to monitor closely and f/up with their recommendations.   4:00 AM PCCM NP Katalina down to discuss case, she states IR team is going to be coming to evaluate whether she is eligible for a procedure before deciding on what admission route to go through. Will continue to monitor and reassess shortly.     6:35 AM Pt remains stable. PCCM team in with pt; IR still hasn't come by therefore decision on who will admit still remains up in the air, PCCM NP will touch base with them to see when they'll be by. Will continue to monitor and await decision.   7:02 AM PCCM team states they're waiting for IR still and depending on their decision regarding EKOS then that will dictate who will admit; Patient care to be resumed by Alyse Low, PA-C at shift change sign-out. Patient history has been discussed with midlevel resuming care. Please see their notes for further documentation of pending results and dispo/care. Pt stable at sign-out and updated on transfer of care.    Final Clinical Impressions(s) / ED Diagnoses   Final diagnoses:  Acute saddle pulmonary embolism without acute cor pulmonale (HCC)  Acute congestive heart failure, unspecified heart failure type (HCC)  Hypoxia  Hypokalemia  SOB (shortness of breath)  Hypothyroidism, unspecified type    ED Discharge Orders    [address], [place], [place] 12/29/17 0703    Macarthur Critchley, MD 01/01/18 [id number]

## 2017-12-28 NOTE — ED Notes (Signed)
istat lactic acid results given to [employer].

## 2017-12-28 NOTE — ED Notes (Signed)
istat trop results given to [employer]

## 2017-12-28 NOTE — ED Triage Notes (Signed)
Pt states that for the past week she has had increased SOB, worse today, denies CP except when coughing , productive cough with white phlegm. Denies fevers. 87% on RA, place on 3L

## 2017-12-29 ENCOUNTER — Other Ambulatory Visit: Payer: Self-pay

## 2017-12-29 ENCOUNTER — Inpatient Hospital Stay (HOSPITAL_COMMUNITY): Payer: Medicare Other

## 2017-12-29 ENCOUNTER — Emergency Department (HOSPITAL_COMMUNITY): Payer: Medicare Other

## 2017-12-29 ENCOUNTER — Encounter (HOSPITAL_COMMUNITY): Payer: Self-pay | Admitting: Interventional Radiology

## 2017-12-29 DIAGNOSIS — E890 Postprocedural hypothyroidism: Secondary | ICD-10-CM | POA: Diagnosis present

## 2017-12-29 DIAGNOSIS — Z888 Allergy status to other drugs, medicaments and biological substances status: Secondary | ICD-10-CM | POA: Diagnosis not present

## 2017-12-29 DIAGNOSIS — R609 Edema, unspecified: Secondary | ICD-10-CM | POA: Diagnosis not present

## 2017-12-29 DIAGNOSIS — Z6841 Body Mass Index (BMI) 40.0 and over, adult: Secondary | ICD-10-CM | POA: Diagnosis not present

## 2017-12-29 DIAGNOSIS — I10 Essential (primary) hypertension: Secondary | ICD-10-CM | POA: Diagnosis present

## 2017-12-29 DIAGNOSIS — E872 Acidosis: Secondary | ICD-10-CM | POA: Diagnosis present

## 2017-12-29 DIAGNOSIS — E876 Hypokalemia: Secondary | ICD-10-CM | POA: Diagnosis present

## 2017-12-29 DIAGNOSIS — K219 Gastro-esophageal reflux disease without esophagitis: Secondary | ICD-10-CM | POA: Diagnosis present

## 2017-12-29 DIAGNOSIS — Z79899 Other long term (current) drug therapy: Secondary | ICD-10-CM | POA: Diagnosis not present

## 2017-12-29 DIAGNOSIS — R001 Bradycardia, unspecified: Secondary | ICD-10-CM | POA: Diagnosis not present

## 2017-12-29 DIAGNOSIS — R402413 Glasgow coma scale score 13-15, at hospital admission: Secondary | ICD-10-CM | POA: Diagnosis present

## 2017-12-29 DIAGNOSIS — I2602 Saddle embolus of pulmonary artery with acute cor pulmonale: Secondary | ICD-10-CM | POA: Diagnosis present

## 2017-12-29 DIAGNOSIS — C50412 Malignant neoplasm of upper-outer quadrant of left female breast: Secondary | ICD-10-CM | POA: Diagnosis present

## 2017-12-29 DIAGNOSIS — R0902 Hypoxemia: Secondary | ICD-10-CM | POA: Diagnosis not present

## 2017-12-29 DIAGNOSIS — G4733 Obstructive sleep apnea (adult) (pediatric): Secondary | ICD-10-CM | POA: Diagnosis present

## 2017-12-29 DIAGNOSIS — R7989 Other specified abnormal findings of blood chemistry: Secondary | ICD-10-CM | POA: Diagnosis present

## 2017-12-29 DIAGNOSIS — R0602 Shortness of breath: Secondary | ICD-10-CM | POA: Diagnosis not present

## 2017-12-29 DIAGNOSIS — R739 Hyperglycemia, unspecified: Secondary | ICD-10-CM | POA: Diagnosis present

## 2017-12-29 DIAGNOSIS — M1990 Unspecified osteoarthritis, unspecified site: Secondary | ICD-10-CM | POA: Diagnosis present

## 2017-12-29 DIAGNOSIS — I2699 Other pulmonary embolism without acute cor pulmonale: Secondary | ICD-10-CM | POA: Diagnosis not present

## 2017-12-29 DIAGNOSIS — R2681 Unsteadiness on feet: Secondary | ICD-10-CM | POA: Diagnosis present

## 2017-12-29 DIAGNOSIS — I2692 Saddle embolus of pulmonary artery without acute cor pulmonale: Secondary | ICD-10-CM

## 2017-12-29 DIAGNOSIS — H9190 Unspecified hearing loss, unspecified ear: Secondary | ICD-10-CM | POA: Diagnosis present

## 2017-12-29 DIAGNOSIS — I2721 Secondary pulmonary arterial hypertension: Secondary | ICD-10-CM | POA: Diagnosis present

## 2017-12-29 DIAGNOSIS — Z79811 Long term (current) use of aromatase inhibitors: Secondary | ICD-10-CM | POA: Diagnosis not present

## 2017-12-29 DIAGNOSIS — I361 Nonrheumatic tricuspid (valve) insufficiency: Secondary | ICD-10-CM

## 2017-12-29 DIAGNOSIS — F419 Anxiety disorder, unspecified: Secondary | ICD-10-CM | POA: Diagnosis present

## 2017-12-29 DIAGNOSIS — E039 Hypothyroidism, unspecified: Secondary | ICD-10-CM | POA: Diagnosis not present

## 2017-12-29 DIAGNOSIS — J9601 Acute respiratory failure with hypoxia: Secondary | ICD-10-CM | POA: Diagnosis present

## 2017-12-29 DIAGNOSIS — Z9981 Dependence on supplemental oxygen: Secondary | ICD-10-CM | POA: Diagnosis not present

## 2017-12-29 HISTORY — PX: IR ANGIOGRAM SELECTIVE EACH ADDITIONAL VESSEL: IMG667

## 2017-12-29 HISTORY — PX: IR ANGIOGRAM PULMONARY BILATERAL SELECTIVE: IMG664

## 2017-12-29 HISTORY — DX: Saddle embolus of pulmonary artery without acute cor pulmonale: I26.92

## 2017-12-29 HISTORY — PX: IR INFUSION THROMBOL ARTERIAL INITIAL (MS): IMG5376

## 2017-12-29 HISTORY — PX: IR US GUIDE VASC ACCESS RIGHT: IMG2390

## 2017-12-29 LAB — BASIC METABOLIC PANEL
Anion gap: 11 (ref 5–15)
BUN: 11 mg/dL (ref 6–20)
CO2: 26 mmol/L (ref 22–32)
CREATININE: 0.9 mg/dL (ref 0.44–1.00)
Calcium: 9.2 mg/dL (ref 8.9–10.3)
Chloride: 102 mmol/L (ref 101–111)
GFR calc Af Amer: >60 mL/min (ref >60)
GFR calc non Af Amer: >60 mL/min (ref >60)
GLUCOSE: 112 mg/dL — AB (ref 65–99)
POTASSIUM: 3.6 mmol/L (ref 3.5–5.1)
SODIUM: 139 mmol/L (ref 135–145)

## 2017-12-29 LAB — CBC
HEMATOCRIT: 34.3 % — AB (ref 36.0–46.0)
HEMOGLOBIN: 11.4 g/dL — AB (ref 12.0–15.0)
MCH: 33 pg (ref 26.0–34.0)
MCHC: 33.2 g/dL (ref 30.0–36.0)
MCV: 99.4 fL (ref 78.0–100.0)
Platelets: 124 10*3/uL — ABNORMAL LOW (ref 150–400)
RBC: 3.45 MIL/uL — AB (ref 3.87–5.11)
RDW: 14.2 % (ref 11.5–15.5)
WBC: 6.1 10*3/uL (ref 4.0–10.5)

## 2017-12-29 LAB — URINALYSIS, ROUTINE W REFLEX MICROSCOPIC
Bilirubin Urine: NEGATIVE
GLUCOSE, UA: NEGATIVE mg/dL
Ketones, ur: NEGATIVE mg/dL
Leukocytes, UA: NEGATIVE
Nitrite: NEGATIVE
PROTEIN: NEGATIVE mg/dL
SPECIFIC GRAVITY, URINE: 1.004 — AB (ref 1.005–1.030)
pH: 7 (ref 5.0–8.0)

## 2017-12-29 LAB — HEPARIN LEVEL (UNFRACTIONATED)
HEPARIN UNFRACTIONATED: 0.25 [IU]/mL — AB (ref 0.30–0.70)
Heparin Unfractionated: 0.23 IU/mL — ABNORMAL LOW (ref 0.30–0.70)

## 2017-12-29 LAB — FIBRINOGEN
FIBRINOGEN: 252 mg/dL (ref 210–475)
FIBRINOGEN: 236 mg/dL (ref 210–475)

## 2017-12-29 LAB — MAGNESIUM: MAGNESIUM: 2 mg/dL (ref 1.7–2.4)

## 2017-12-29 LAB — I-STAT VENOUS BLOOD GAS, ED
Acid-Base Excess: 3 mmol/L — ABNORMAL HIGH (ref 0.0–2.0)
Bicarbonate: 27.1 mmol/L (ref 20.0–28.0)
O2 Saturation: 53 %
PCO2 VEN: 39.5 mmHg — AB (ref 44.0–60.0)
TCO2: 28 mmol/L (ref 22–32)
pH, Ven: 7.444 — ABNORMAL HIGH (ref 7.250–7.430)
pO2, Ven: 27 mmHg — CL (ref 32.0–45.0)

## 2017-12-29 LAB — MRSA PCR SCREENING: MRSA BY PCR: NEGATIVE

## 2017-12-29 LAB — GLUCOSE, CAPILLARY
GLUCOSE-CAPILLARY: 111 mg/dL — AB (ref 65–99)
Glucose-Capillary: 86 mg/dL (ref 65–99)
Glucose-Capillary: 89 mg/dL (ref 65–99)

## 2017-12-29 LAB — PHOSPHORUS: Phosphorus: 2.2 mg/dL — ABNORMAL LOW (ref 2.5–4.6)

## 2017-12-29 LAB — D-DIMER, QUANTITATIVE (NOT AT ARMC)

## 2017-12-29 LAB — PROTIME-INR
INR: 1.2
Prothrombin Time: 15.1 seconds (ref 11.4–15.2)

## 2017-12-29 LAB — LACTIC ACID, PLASMA: LACTIC ACID, VENOUS: 1.9 mmol/L (ref 0.5–1.9)

## 2017-12-29 LAB — CBG MONITORING, ED: GLUCOSE-CAPILLARY: 89 mg/dL (ref 65–99)

## 2017-12-29 LAB — HEMOGLOBIN A1C
Hgb A1c MFr Bld: 5.1 % (ref 4.8–5.6)
MEAN PLASMA GLUCOSE: 99.67 mg/dL

## 2017-12-29 LAB — ECHOCARDIOGRAM COMPLETE
Height: 63 in
WEIGHTICAEL: 4948.89 [oz_av]

## 2017-12-29 IMAGING — US IR ANGIO/PULMON/BI
5 series · 12 of 18 positions shown · non-contrast
Comparison: CT PE study [DATE]

INDICATION: 67-year-old female with acute large volume bilateral pulmonary
emboli and associated cor pulmonale (submassive/intermediate risk
pulmonary embolus). She presents for catheter directed bilateral
pulmonary arterial thrombolysis.
TECHNIQUE: Informed written consent was obtained from the patient after a
thorough discussion of the procedural risks, benefits and
alternatives. All questions were addressed. Maximal Sterile Barrier
Technique was utilized including caps, mask, sterile gowns, sterile
gloves, sterile drape, hand hygiene and skin antiseptic. A timeout
was performed prior to the initiation of the procedure.

[Series 1: ir angio/pulmon/bi · 1 of 1 slices shown]
[im 1/1]
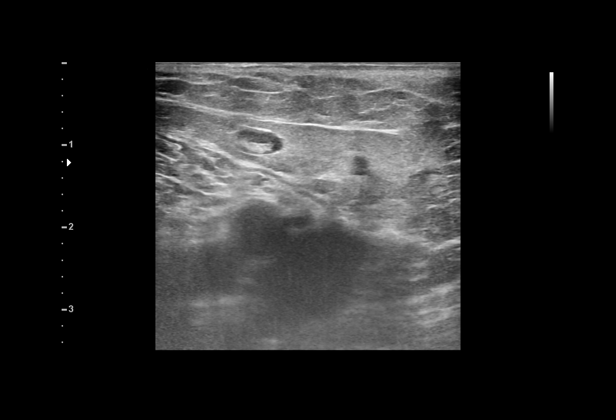

[Series 1: fl (-) angio · 1 of 2 slices shown]
[im 2/2]
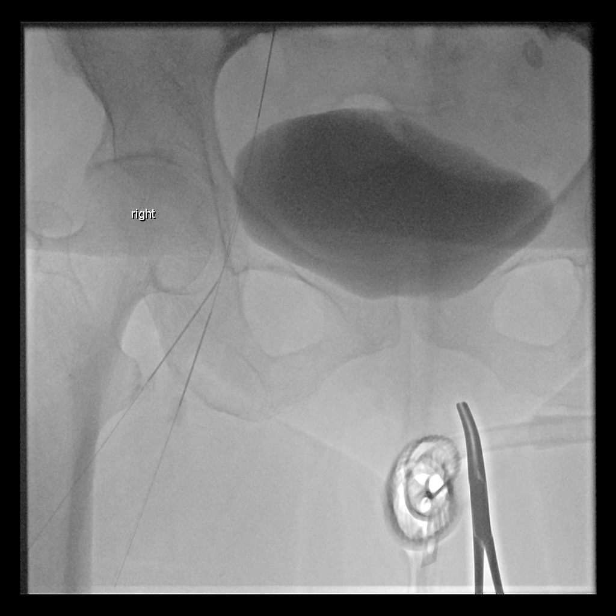

[Series 2: body 4 care · 2 acquisitions, 3 frames shown (1 of 3)]
[im 1/2  full-range]
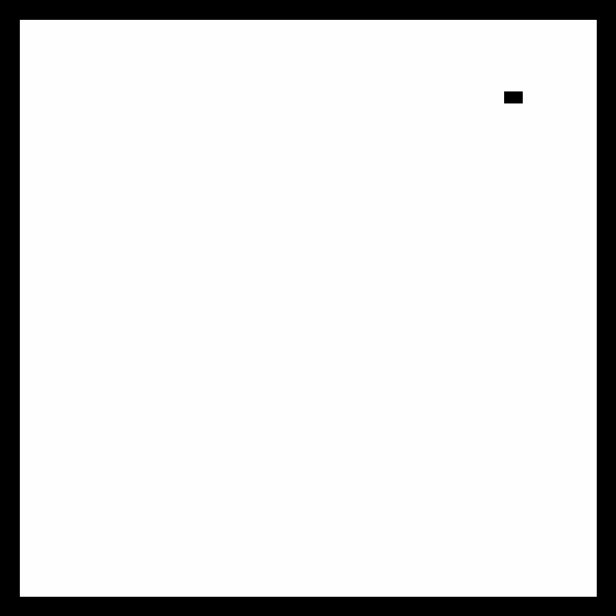
[im 1/2]
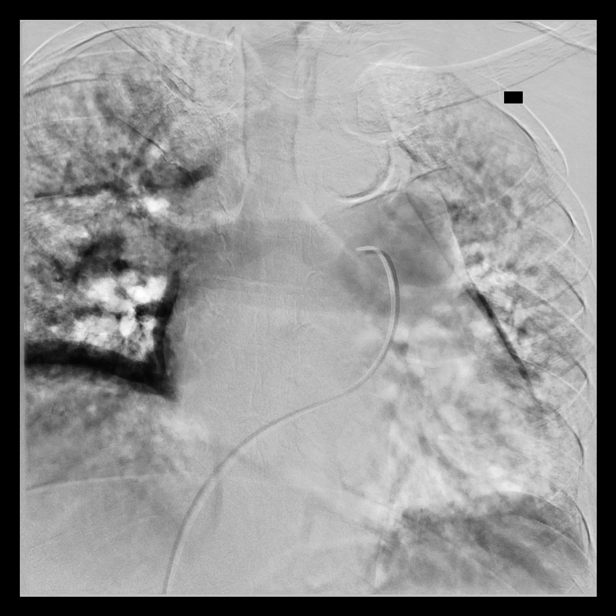
[im 1/2]
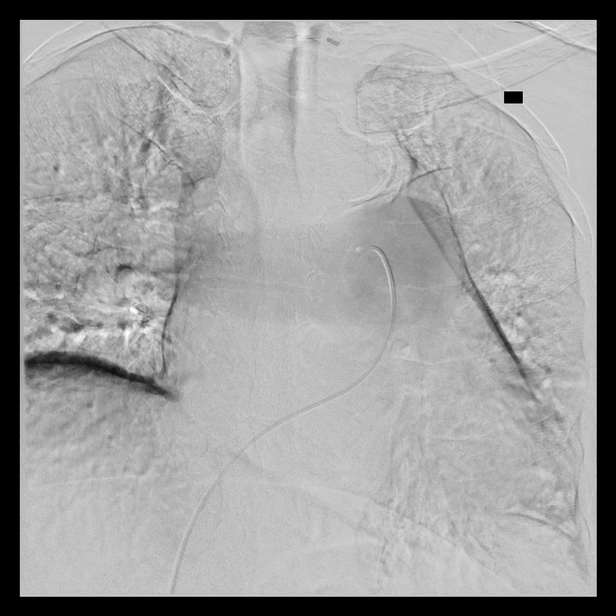

[Series 3: body 4 care · 2 acquisitions, 4 frames shown (2 of 3)]
[im 1/2]
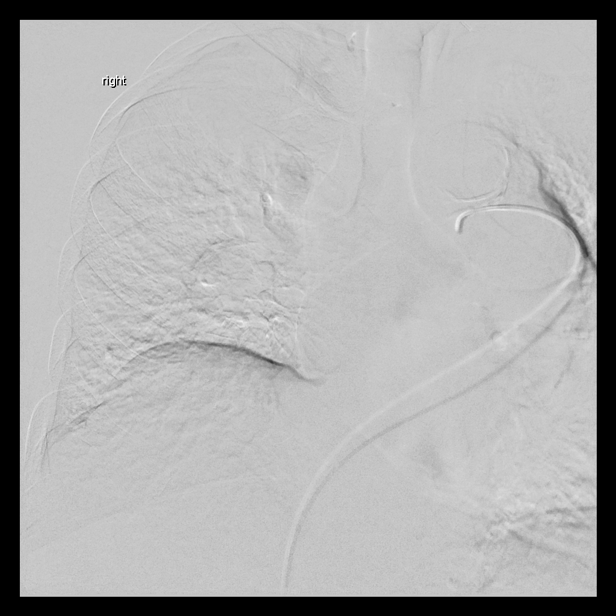
[im 1/2]
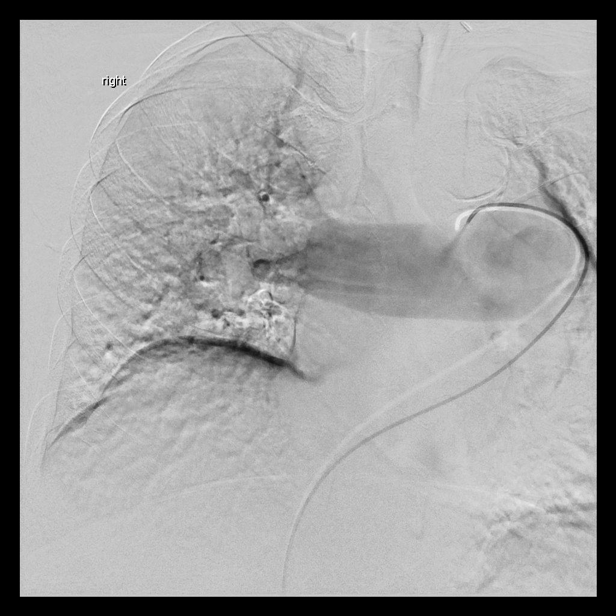
[im 1/2]
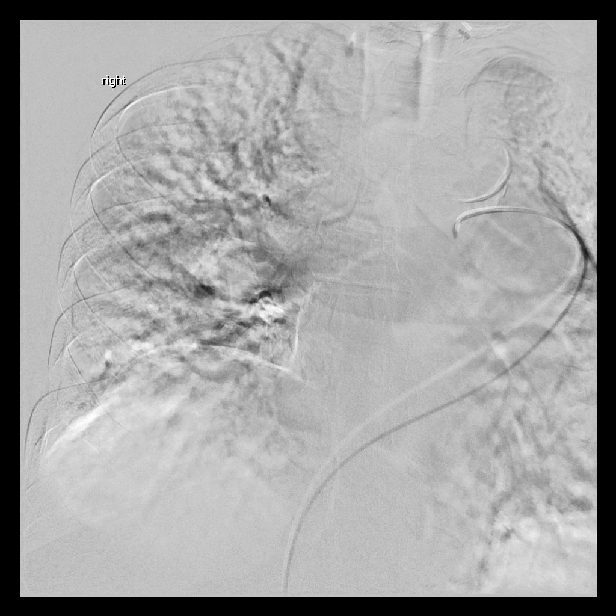
[im 2/2]
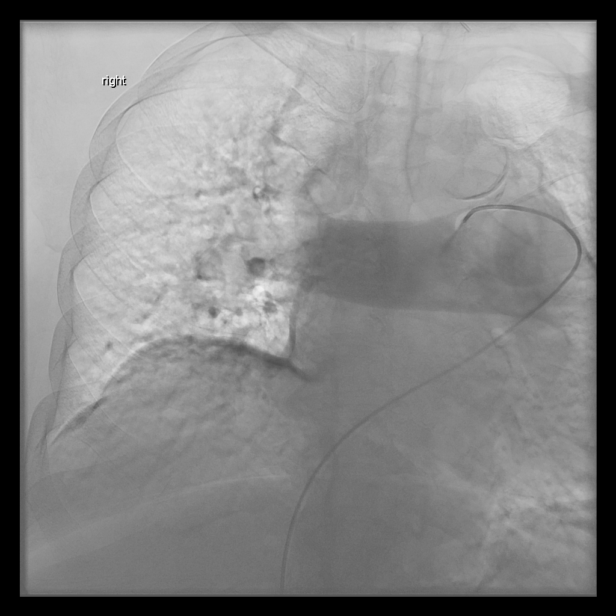

[Series 4: body 4 care · 2 acquisitions, 3 frames shown (3 of 3)]
[im 1/2]
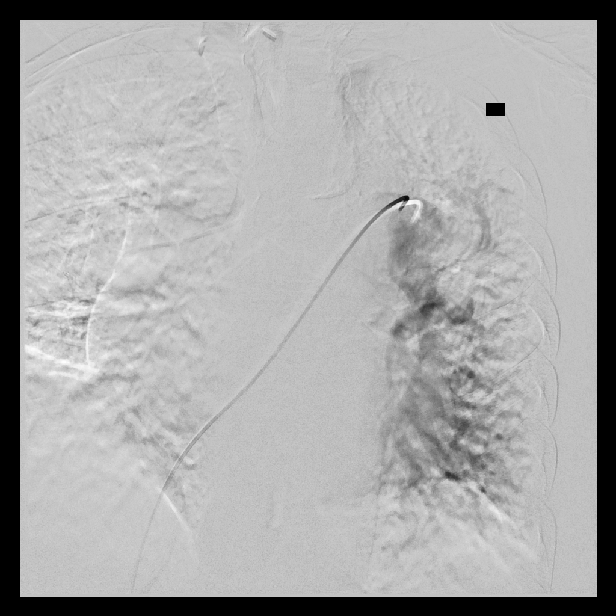
[im 1/2]
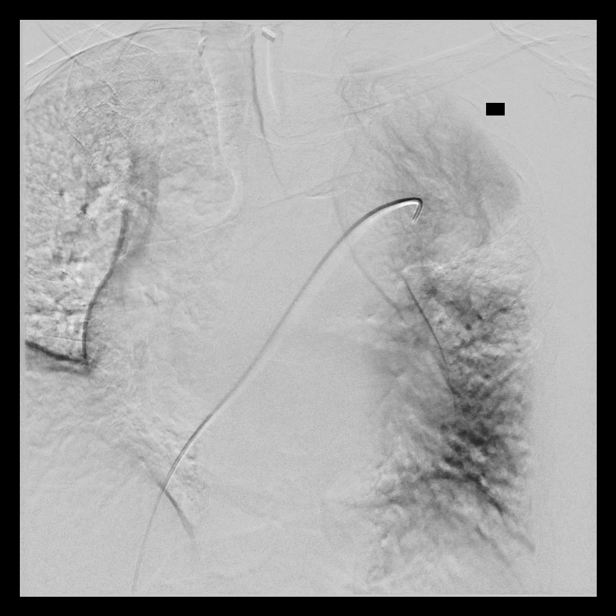
[im 2/2]
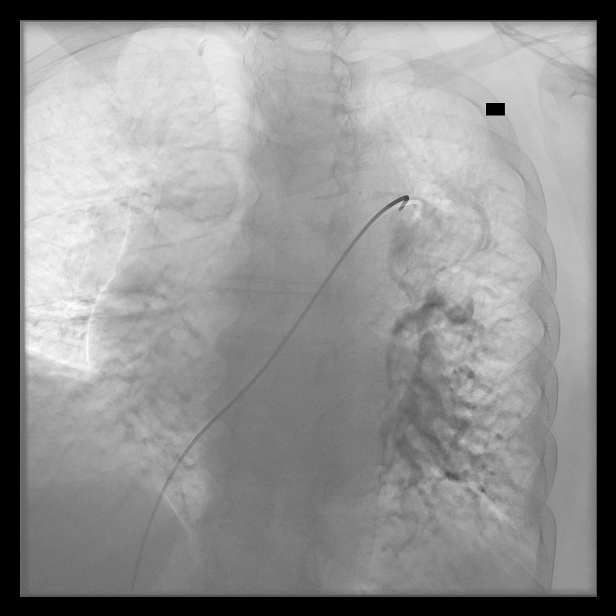

[12 of 18 positions shown; findings below may reference images not displayed]

EXAM:
BILATERAL PULMONARY ARTERIOGRAPHY; IR INFUSION THROMBOL ARTERIAL
INITIAL (MS); IR ULTRASOUND GUIDANCE VASC ACCESS RIGHT; ADDITIONAL
ARTERIOGRAPHY

1. Ultrasound-guided access right common femoral vein
2. Second ultrasound-guided access right common femoral vein
3. Catheterization of the main pulmonary artery with main pulmonary
arteriogram and pressure measurements
4. Catheterization of the left pulmonary artery with arteriogram
5. Placement of a left lower lobe pulmonary artery multi sidehole
ultrasound assisted infusion catheter
6. Catheterization of the right pulmonary artery with arteriogram
7. Placement of a right lower lobe pulmonary artery multi sidehole
ultrasound assisted infusion catheter
8. Initiation of bilateral pulmonary arterial thrombolysis
MEDICATIONS:
None.

ANESTHESIA/SEDATION:
Versed 1 mg IV; Fentanyl 50 mcg IV

Moderate Sedation Time:  39 minutes

The patient was continuously monitored during the procedure by the
interventional radiology nurse under my direct supervision.

FLUOROSCOPY TIME:  Fluoroscopy Time: 11 minutes 0 seconds (122 mGy).

COMPLICATIONS:
None immediate.
The right was interrogated with ultrasound and found to be widely
patent. An image was obtained and stored for the medical record.
Local anesthesia was attained by infiltration with 1% lidocaine. A
small dermatotomy was made. Under real-time sonographic guidance,
the vessel was punctured with a 21 gauge micropuncture needle. Using
standard technique, the initial micro needle was exchanged over a
0.018 micro wire for a transitional 4 French micro sheath. The micro
sheath was then exchanged over a 0.035 wire for a 6 French vascular
sheath. Using the same technique, a second 6 French vascular sheath
was also placed into the common femoral vein just lateral to the
first.

Working through the more medial sheath, a C2 cobra catheter was
advanced over a Bentson wire in used to select the main pulmonary
artery. A main pulmonary arteriogram was performed. There is massive
enlargement of the pulmonary trunk and central left and right
pulmonary arteries with very poor opacification of the lobar
pulmonary arteries bilaterally secondary to acute thrombus. The
pulmonary arterial pressure was obtained at 53/25 (35) mm Hg. This
is consistent with pulmonary arterial hypertension.

The C2 cobra catheter was then navigated into the left main
pulmonary artery. A left pulmonary arteriogram was performed.
Occlusive thrombus is present throughout the left lower and upper
lobe pulmonary arteries. Using a glidewire, the catheter was
successfully navigated into the distal aspect of the left lower lobe
pulmonary artery. The glidewire was then exchanged for a Rosen wire.
The 5 French catheter was removed and a 12 cm infusion length EKOS
multi sidehole ultrasound assisted infusion catheter was advanced
over the wire and positioned in the left lower lobe pulmonary
artery.

The C2 cobra catheter was then advanced through the more lateral
sheath and navigated into the right main pulmonary artery. A right
main pulmonary arteriogram was performed again demonstrating
occlusive thrombus within the right upper, middle and lower lobe
pulmonary arteries. The catheter was successfully navigated over a
Glidewire into the right lower lobe pulmonary artery. The glidewire
was then exchanged for a rose in wire. The 5 French catheter was
removed and a second 12 cm infusion length EKOS multi sidehole
ultrasound assisted infusion catheter was advanced over the wire and
positioned in the right lower lobe pulmonary artery.

The infusion catheters were connected to tPA and bilateral pulmonary
artery arterial thrombolysis was initiated at a rate of 1
milligram/hour per catheter for a total of 2 milligrams/hour. The
sheaths were secured with 0 Prolene suture. The patient tolerated
the procedure well.
FINDINGS: Main PAP: 53/25 (35) mm Hg
IMPRESSION: 1. Suspect acute on chronic pulmonary artery hypertension. The
pulmonary trunk and main pulmonary arteries are massive suggesting
underlying chronic pulmonary arterial hypertension.
2. Occlusive or near occlusive thrombus present within the lobar
arteries of all lobes of both lungs.
3. Successful initiation of bilateral pulmonary artery thrombolysis
using ultrasound assisted multi side-hole infusion catheters (EKOS).

## 2017-12-29 IMAGING — CT CT ANGIO CHEST
3 of 7 series · 18 of 36 positions shown · IV contrast (APPLIED)
Comparison: Chest radiograph [DATE]

CLINICAL DATA: Shortness of breath and chest pain

EXAM:
CT ANGIOGRAPHY CHEST WITH CONTRAST
TECHNIQUE: Multidetector CT imaging of the chest was performed using the
standard protocol during bolus administration of intravenous
contrast. Multiplanar CT image reconstructions and MIPs were
obtained to evaluate the vascular anatomy.
CONTRAST:  100mL [07] IOPAMIDOL ([07]) INJECTION 76%

[Series 6: thins · axial · 0.75mm/px · z∈[-185,+60]mm · 15 of 401 slices shown]
[im 26/401  lung]
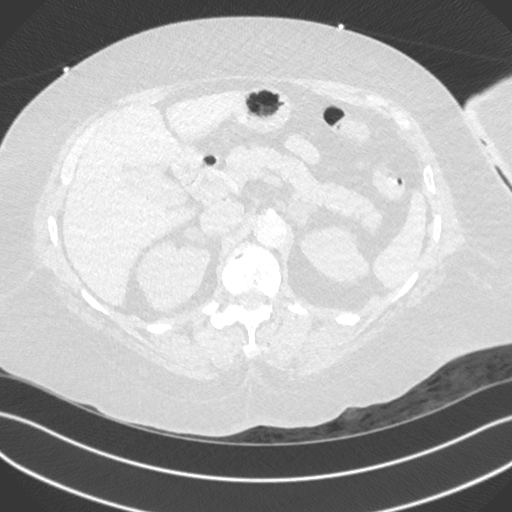
[im 51/401  mediastinal]
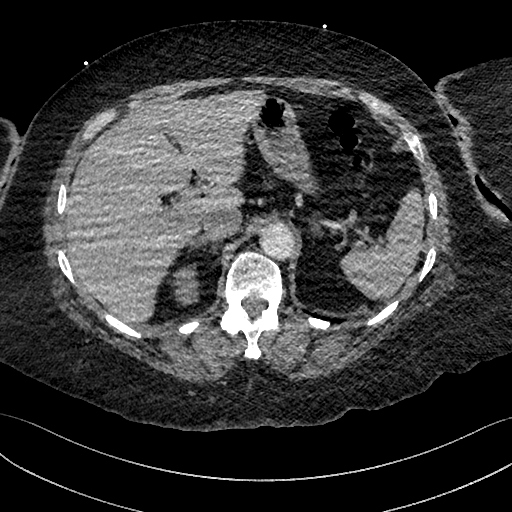
[im 76/401  lung]
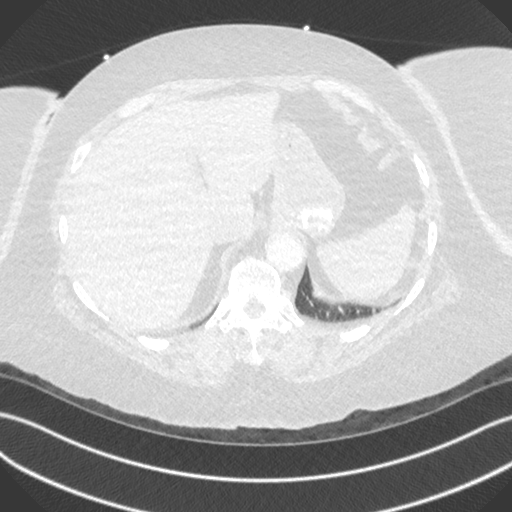
[im 101/401  mediastinal]
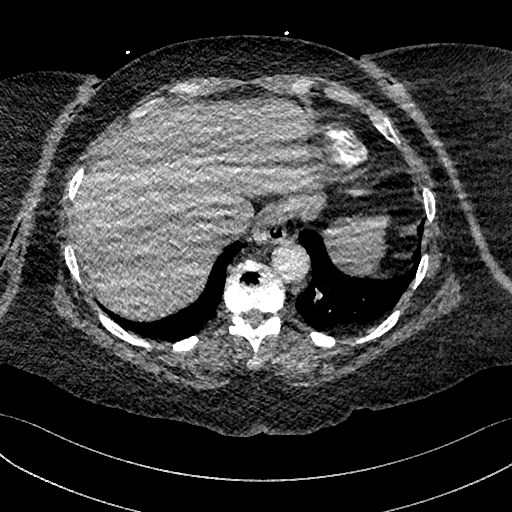
[im 126/401  lung]
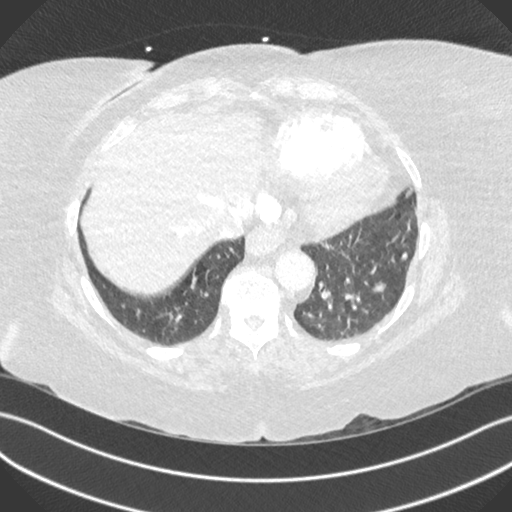
[im 151/401  mediastinal]
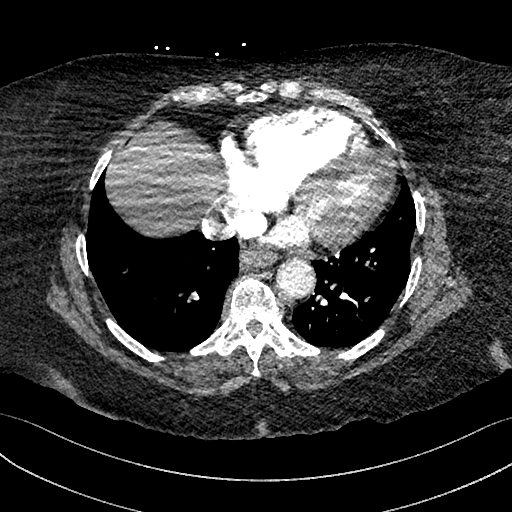
[im 176/401  lung]
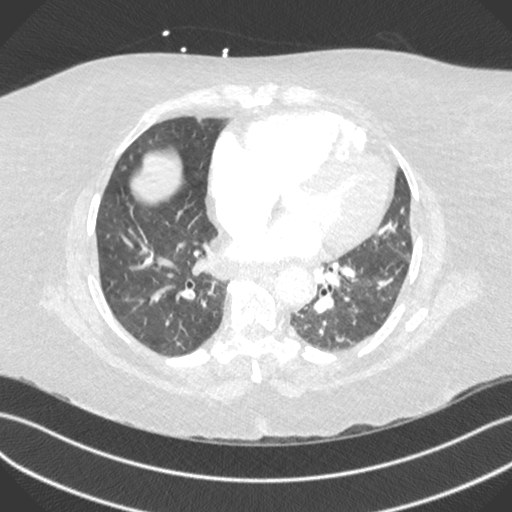
[im 201/401  mediastinal]
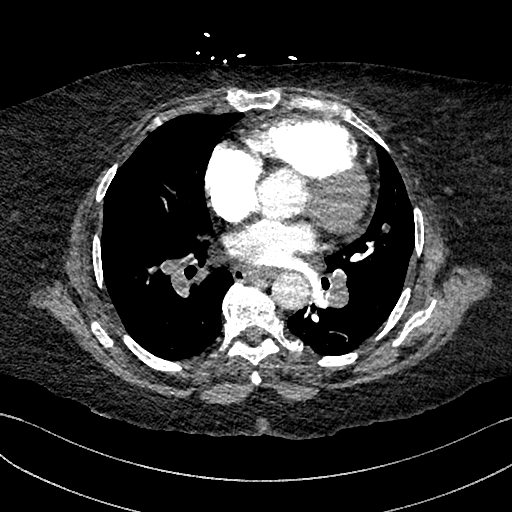
[im 226/401  lung]
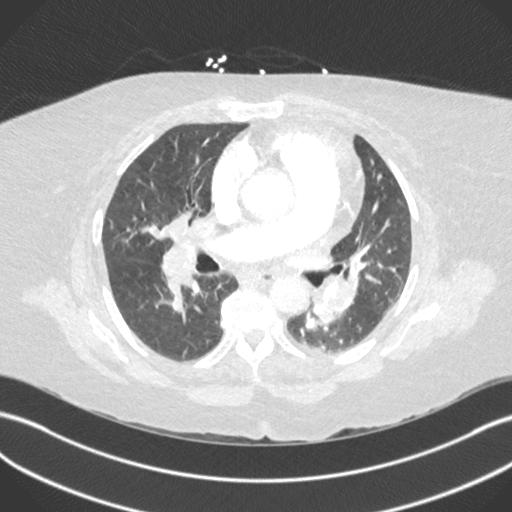
[im 251/401  mediastinal]
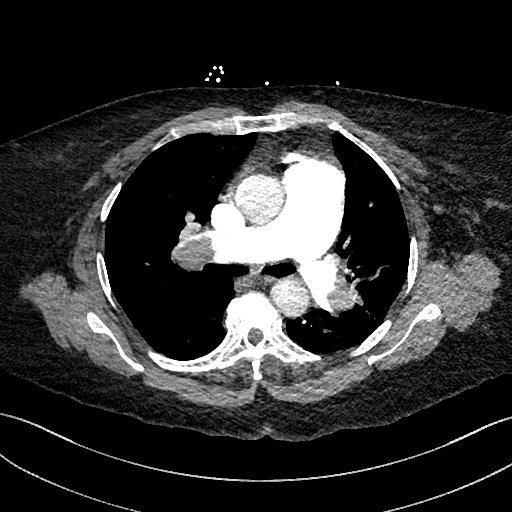
[im 276/401  lung]
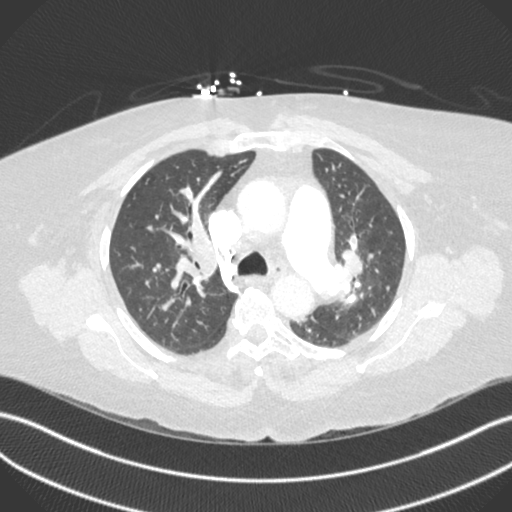
[im 301/401  mediastinal]
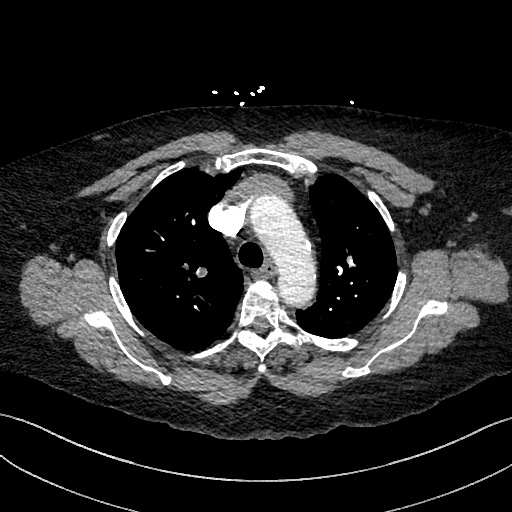
[im 326/401  lung]
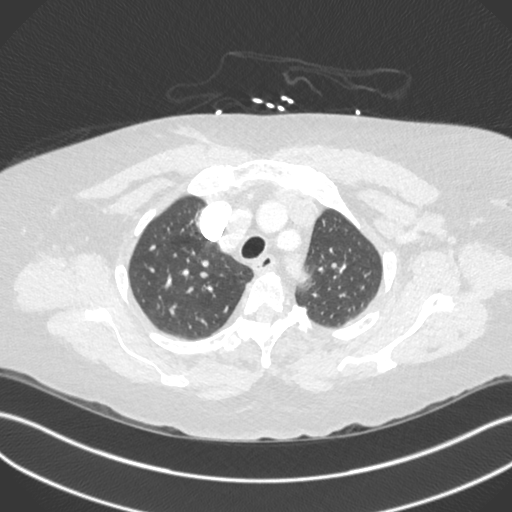
[im 351/401  mediastinal]
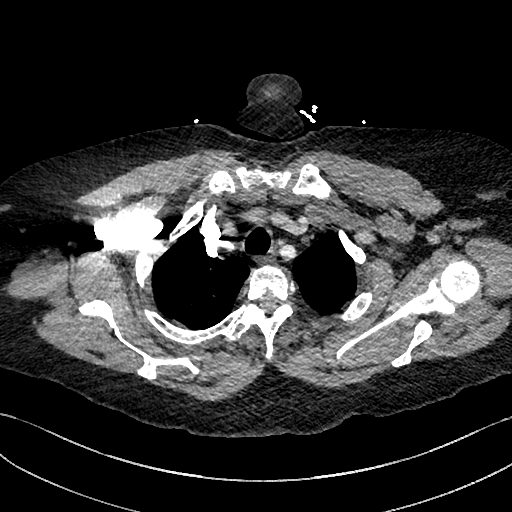
[im 376/401  lung]
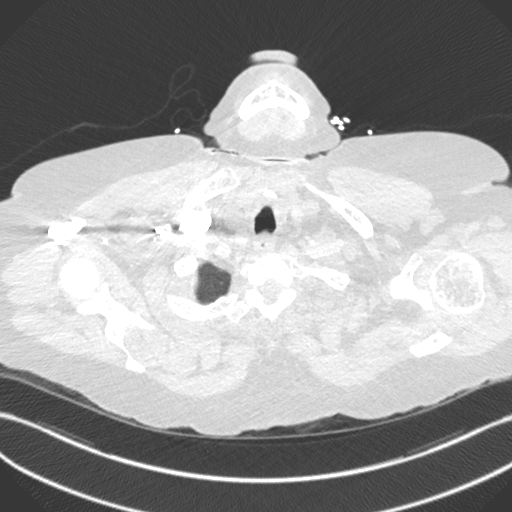

[Series 7: lung · axial · 0.63mm/px · z∈[-103,-43]mm · 2 of 120 slices shown]
[im 30/120  mediastinal]
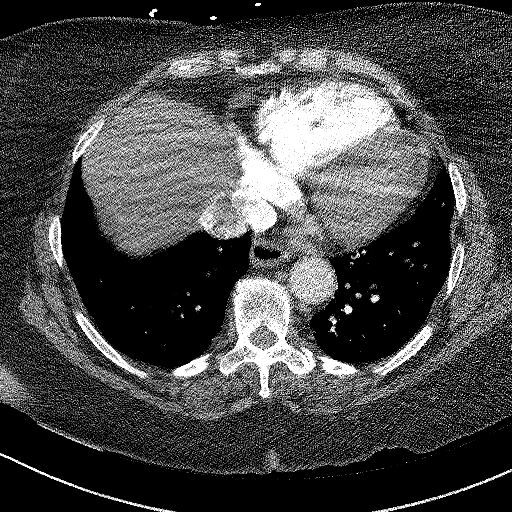
[im 60/120  mediastinal]
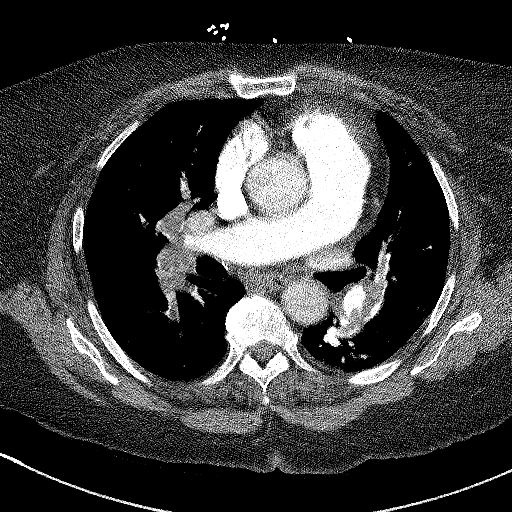

[Series 8: cor · coronal · 0.54mm/px · 1 of 123 slices shown]
[im 62/123  mediastinal]
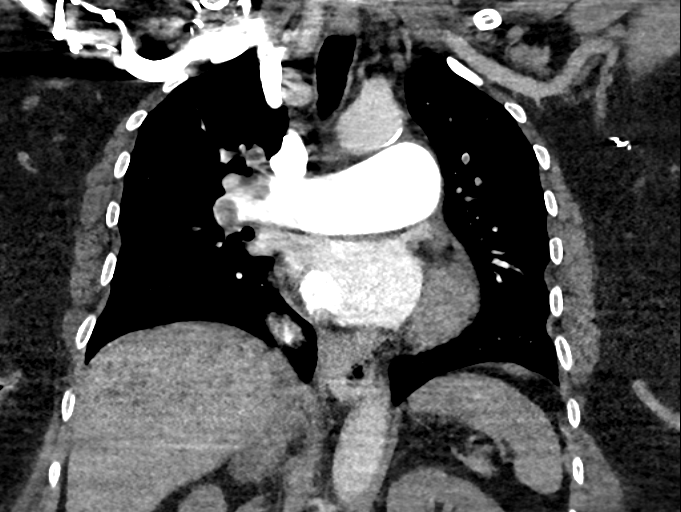

[18 of 36 positions shown; findings below may reference images not displayed]

FINDINGS: Cardiovascular: Contrast injection is sufficient to demonstrate
satisfactory opacification of the pulmonary arteries to the
segmental level.There is large volume pulmonary embolus within both
distal main pulmonary arteries and extending into the lobar and
segmental branches. There is evidence of right heart strain with an
RV/LV ratio of approximately 3. The main pulmonary artery is
enlarged, measuring 4 cm at the bifurcation. There is a normal
3-vessel arch branching pattern without evidence of acute aortic
syndrome. There is mildaortic atherosclerosis. Heart size is normal,
without pericardial effusion.

Mediastinum/Nodes: No mediastinal, hilar or axillary
lymphadenopathy. The visualized thyroid and thoracic esophageal
course are unremarkable.

Lungs/Pleura: No pulmonary nodules or masses. No pleural effusion or
pneumothorax. No focal airspace consolidation. No focal pleural
abnormality.

Upper Abdomen: Contrast bolus timing is not optimized for evaluation
of the abdominal organs. Within this limitation, the visualized
organs of the upper abdomen are normal.

Musculoskeletal: No chest wall abnormality. No acute or significant
osseous findings.

Review of the MIP images confirms the above findings.
IMPRESSION: 1. Positive for acute bilateral main pulmonary artery embolus with
CT evidence of right heart strain (RV/LV Ratio = 3), consistent with
at least submassive (intermediate risk) PE. Large volume emboli
extend into the lobar and segmental arteries of both lungs. The
presence of right heart strain has been associated with an increased
risk of morbidity and mortality. Please activate Code PE by paging
[[email or web address]].
2.  Aortic Atherosclerosis ([07]-[07]).

Critical Value/emergent results were called by telephone at the time
of interpretation on [DATE] at [DATE] to CHIKA , who
verbally acknowledged these results.

## 2017-12-29 MED ORDER — LIDOCAINE HCL (PF) 1 % IJ SOLN
INTRAMUSCULAR | Status: AC
  Filled 2017-12-29: qty 30

## 2017-12-29 MED ORDER — IOPAMIDOL (ISOVUE-370) INJECTION 76%
INTRAVENOUS | Status: AC
  Filled 2017-12-29: qty 100

## 2017-12-29 MED ORDER — SODIUM CHLORIDE 0.9 % IV SOLN: 250.0000 mL | INTRAVENOUS | Status: DC | PRN

## 2017-12-29 MED ORDER — SODIUM CHLORIDE 0.9 % IV SOLN
12.0000 mg | Freq: Once | INTRAVENOUS | Status: AC
  Administered 2017-12-29: 12 mg via INTRAVENOUS
  Filled 2017-12-29 (×2): qty 12

## 2017-12-29 MED ORDER — IOPAMIDOL (ISOVUE-300) INJECTION 61%
INTRAVENOUS | Status: AC
  Administered 2017-12-29: 30 mL
  Filled 2017-12-29: qty 50

## 2017-12-29 MED ORDER — FENTANYL CITRATE (PF) 100 MCG/2ML IJ SOLN
INTRAMUSCULAR | Status: AC | PRN
  Administered 2017-12-29: 50 ug via INTRAVENOUS

## 2017-12-29 MED ORDER — FENTANYL CITRATE (PF) 100 MCG/2ML IJ SOLN
INTRAMUSCULAR | Status: AC
  Filled 2017-12-29: qty 4

## 2017-12-29 MED ORDER — SODIUM CHLORIDE 0.9% FLUSH: 3.0000 mL | INTRAVENOUS | Status: DC | PRN

## 2017-12-29 MED ORDER — HEPARIN (PORCINE) IN NACL 100-0.45 UNIT/ML-% IJ SOLN
1900.0000 [IU]/h | INTRAMUSCULAR | Status: DC
  Administered 2017-12-29: 1600 [IU]/h via INTRAVENOUS
  Administered 2017-12-30: 1900 [IU]/h via INTRAVENOUS
  Filled 2017-12-29 (×4): qty 250

## 2017-12-29 MED ORDER — ORAL CARE MOUTH RINSE
15.0000 mL | Freq: Two times a day (BID) | OROMUCOSAL | Status: DC
  Administered 2017-12-30 – 2018-01-03 (×7): 15 mL via OROMUCOSAL

## 2017-12-29 MED ORDER — LIDOCAINE HCL (PF) 1 % IJ SOLN
INTRAMUSCULAR | Status: AC | PRN
  Administered 2017-12-29: 10 mL

## 2017-12-29 MED ORDER — INSULIN ASPART 100 UNIT/ML ~~LOC~~ SOLN
0.0000 [IU] | SUBCUTANEOUS | Status: DC
  Administered 2018-01-02: 2 [IU] via SUBCUTANEOUS

## 2017-12-29 MED ORDER — MIDAZOLAM HCL 2 MG/2ML IJ SOLN
INTRAMUSCULAR | Status: AC
  Filled 2017-12-29: qty 6

## 2017-12-29 MED ORDER — LEVOTHYROXINE SODIUM 75 MCG PO TABS
150.0000 ug | ORAL_TABLET | Freq: Every day | ORAL | Status: DC
  Administered 2017-12-30 – 2017-12-31 (×2): 150 ug via ORAL
  Filled 2017-12-29: qty 1
  Filled 2017-12-29: qty 2
  Filled 2017-12-29 (×2): qty 1
  Filled 2017-12-29: qty 2

## 2017-12-29 MED ORDER — SODIUM CHLORIDE 0.9 % IV SOLN
INTRAVENOUS | Status: DC
  Administered 2017-12-29: 23:00:00 via INTRAVENOUS

## 2017-12-29 MED ORDER — MIDAZOLAM HCL 2 MG/2ML IJ SOLN
INTRAMUSCULAR | Status: AC | PRN
  Administered 2017-12-29: 1 mg via INTRAVENOUS

## 2017-12-29 MED ORDER — IOPAMIDOL (ISOVUE-370) INJECTION 76%
100.0000 mL | Freq: Once | INTRAVENOUS | Status: AC | PRN
  Administered 2017-12-29: 100 mL via INTRAVENOUS

## 2017-12-29 MED ORDER — SODIUM CHLORIDE 0.9 % IV SOLN: INTRAVENOUS | Status: DC

## 2017-12-29 MED ORDER — ALTEPLASE 50 MG IV SOLR
12.0000 mg | Freq: Once | INTRAVENOUS | Status: AC
  Administered 2017-12-29: 12 mg via INTRAVENOUS
  Filled 2017-12-29 (×2): qty 12

## 2017-12-29 MED ORDER — HEPARIN BOLUS VIA INFUSION
5000.0000 [IU] | Freq: Once | INTRAVENOUS | Status: AC
  Administered 2017-12-29: 5000 [IU] via INTRAVENOUS
  Filled 2017-12-29: qty 5000

## 2017-12-29 MED ORDER — SODIUM CHLORIDE 0.9% FLUSH
3.0000 mL | Freq: Two times a day (BID) | INTRAVENOUS | Status: DC
  Administered 2017-12-30 – 2018-01-03 (×7): 3 mL via INTRAVENOUS

## 2017-12-29 MED ORDER — PANTOPRAZOLE SODIUM 40 MG PO TBEC
40.0000 mg | DELAYED_RELEASE_TABLET | Freq: Every day | ORAL | Status: DC
  Administered 2017-12-30 – 2018-01-03 (×5): 40 mg via ORAL
  Filled 2017-12-29 (×5): qty 1

## 2017-12-29 NOTE — Sedation Documentation (Signed)
PAP 53/25 (35)

## 2017-12-29 NOTE — ED Notes (Signed)
Echo in progress.

## 2017-12-29 NOTE — Procedures (Signed)
Interventional Radiology Procedure Note  Procedure: Pulmonary angiogram and initiation of bilateral PA thrombolysis with EKOS.    Complications: None  Estimated Blood Loss: None  Recommendations: - PE Lysis per protocol at 1mg /hr per catheter for 12 hrs (total dose 24 mg) - Bedrest in 2MW - Labs per protocol Q 6 hrs beginning at 17:15.  Draw through femoral sheath - IR to check pressures and remove catheters and sheaths in am   Signed,  Criselda Peaches, MD

## 2017-12-29 NOTE — Progress Notes (Signed)
Chief Complaint: Patient was seen in consultation today for  Chief Complaint  Patient presents with  . Shortness of Breath   at the request of Dr. Londell Moh  Referring Physician(s): Dr. Londell Moh  Supervising Physician: Jacqulynn Cadet  Patient Status: [facility] - In-pt  History of Present Illness: Sheila Jacobs is a 67 y.o. female who presented to the ER with SOB. Her workup has found extensive bilateral PE with evidence of right heart strain by CT criteria and troponin leak. After assessment by PCCM and review of case and imaging, feel pt is candidate for catheter directed thrombolysis. IR is asked to eval for procedure. Chart, PMHx, meds, labs reviewed. Few risks factors including sedentary lifestyle and prior hx of breast cancer. Pt started on heparin IV in the ER. Echo being performed at time of this exam. No family present.  Past Medical History:  Diagnosis Date  . Anginal pain ([place])   . Anxiety   . Arthritis   . Breast cancer ([place])   . Breast cancer of upper-outer quadrant of left female breast ([place]) 01/31/2016  . Colon polyps   . Enlarged LA (left atrium)   . GERD (gastroesophageal reflux disease)   . Hard of hearing    Wears hearing aids.  . Headache   . Heavy cigarette smoker    Quit 2015  . History of radiation therapy 06/18/16- 07/30/16   Left Breast 45 Gy in 25 fractions, Left Breast Boost 6 Gy in 3 fractions.   . Hyperlipidemia   . Hypertension   . Morbid obesity ([place])   . Numbness and tingling   . Obesity   . Thyroid goiter   . Vertigo    on occasion    Past Surgical History:  Procedure Laterality Date  . BREAST LUMPECTOMY Left 2017   radiation  . BREAST LUMPECTOMY WITH RADIOACTIVE SEED AND SENTINEL LYMPH NODE BIOPSY Left 04/11/2016   Procedure: BREAST LUMPECTOMY WITH RADIOACTIVE SEED AND SENTINEL LYMPH NODE BIOPSY;  Surgeon: Excell Seltzer, MD;  Location: [place];  Service: General;  Laterality: Left;  . COLONOSCOPY WITH PROPOFOL N/A  10/20/2015   Procedure: COLONOSCOPY WITH PROPOFOL;  Surgeon: Mauri Pole, MD;  Location: [facility];  Service: Endoscopy;  Laterality: N/A;  . ESOPHAGOGASTRODUODENOSCOPY (EGD) WITH PROPOFOL N/A 10/20/2015   Procedure: ESOPHAGOGASTRODUODENOSCOPY (EGD) WITH PROPOFOL;  Surgeon: Mauri Pole, MD;  Location: [facility];  Service: Endoscopy;  Laterality: N/A;  . LEFT HEART CATHETERIZATION WITH CORONARY ANGIOGRAM N/A 10/21/2014   Procedure: LEFT HEART CATHETERIZATION WITH CORONARY ANGIOGRAM;  Surgeon: Burnell Blanks, MD;  [facility] C[facility] LAB;  Service: Cardiovascular;  Laterality: N/A;  . THYROIDECTOMY N/A 02/26/2017   Procedure: TOTAL THYROIDECTOMY;  Surgeon: Excell Seltzer, MD;  Location: [facility];  Service: General;  Laterality: N/A;  . TUBAL LIGATION      Allergies: Cortisone; Effexor [venlafaxine]; and Lexapro [escitalopram]  Medications:  Current Facility-Administered Medications:  .  0.9 %  sodium chloride infusion, , Intravenous, Continuous, Jacqulynn Cadet, MD .  0.9 %  sodium chloride infusion, , Intravenous, Continuous, Jacqulynn Cadet, MD .  0.9 %  sodium chloride infusion, , Intravenous, Continuous, Jacqulynn Cadet, MD .  0.9 %  sodium chloride infusion, , Intravenous, Continuous, Jacqulynn Cadet, MD .  alteplase (tPA/ACTIVASE) 12 mg in sodium chloride 0.9 % 238 mL, 12 mg, Intravenous, Once, Jacqulynn Cadet, MD .  alteplase (tPA/ACTIVASE) 12 mg in sodium chloride 0.9 % 238 mL, 12 mg, Intravenous, Once, Jacqulynn Cadet, MD .  heparin  ADULT infusion 100 units/mL (25000 units/265mL sodium chloride 0.45%), 1,600 Units/hr, Intravenous, Continuous, Laren Everts, RPH, Last Rate: 16 mL/hr at 12/29/17 0344, 1,600 Units/hr at 12/29/17 0344 .  insulin aspart (novoLOG) injection 0-15 Units, 0-15 Units, Subcutaneous, Q4H, Eubanks, Katalina M, NP .  iopamidol (ISOVUE-370) 76 % injection, , , ,  .  levothyroxine (SYNTHROID, LEVOTHROID) tablet 150 mcg, 150 mcg,  Oral, QAC breakfast, Eubanks, Katalina M, NP .  pantoprazole (PROTONIX) EC tablet 40 mg, 40 mg, Oral, Daily, Omar Person, NP  Current Outpatient Medications:  .  ALPRAZolam (XANAX) 0.25 MG tablet, TAKE ONE TABLET BY MOUTH TWICE DAILY AS NEEDED FOR ANXIETY, Disp: 30 tablet, Rfl: 1 .  anastrozole (ARIMIDEX) 1 MG tablet, Take 1 tablet (1 mg total) by mouth daily., Disp: 90 tablet, Rfl: 3 .  aspirin EC 81 MG tablet, Take 81 mg by mouth daily., Disp: , Rfl:  .  hydrochlorothiazide (HYDRODIURIL) 25 MG tablet, Take 25 mg by mouth daily., Disp: , Rfl:  .  levothyroxine (SYNTHROID, LEVOTHROID) 150 MCG tablet, Take 1 tablet (150 mcg total) by mouth daily., Disp: 30 tablet, Rfl: 3 .  pantoprazole (PROTONIX) 40 MG tablet, TAKE ONE TABLET BY MOUTH ONCE DAILY, Disp: 30 tablet, Rfl: 11 .  ketoconazole (NIZORAL) 2 % cream, APPLY TOPICALLY DAILY AS NEEDED FOR IRRITATION (Patient not taking: Reported on 12/28/2017), Disp: 30 g, Rfl: 0    Family History  Problem Relation Age of Onset  . Hypertension Mother   . Heart disease Mother        Pacemaker  . Kidney disease Mother   . Cataracts Mother   . Hearing loss Mother   . Diabetes Mother   . Liver disease Mother   . Diabetes Sister   . Cataracts Brother   . Hearing loss Brother   . Colon cancer Neg Hx   . Thyroid disease Neg Hx     Social History   Socioeconomic History  . Marital status: Single    Spouse name: Not on file  . Number of children: 1  . Years of education: 72  . Highest education level: Not on file  Occupational History  . Occupation: Retired  Scientific laboratory technician  . Financial resource strain: Not on file  . Food insecurity:    Worry: Not on file    Inability: Not on file  . Transportation needs:    Medical: Not on file    Non-medical: Not on file  Tobacco Use  . Smoking status: Former Smoker    Packs/day: 1.00    Years: 45.00    Pack years: 45.00    Types: Cigarettes    Last attempt to quit: 07/11/2014    Years since  quitting: 3.4  . Smokeless tobacco: Never Used  Substance and Sexual Activity  . Alcohol use: No    Alcohol/week: 0.0 oz  . Drug use: No  . Sexual activity: Not on file  Lifestyle  . Physical activity:    Days per week: Not on file    Minutes per session: Not on file  . Stress: Not on file  Relationships  . Social connections:    Talks on phone: Not on file    Gets together: Not on file    Attends religious service: Not on file    Active member of club or organization: Not on file    Attends meetings of clubs or organizations: Not on file    Relationship status: Not on file  Other Topics Concern  .  Not on file  Social History Narrative   Lives with daughter.    Right-hand.   Occasional use of caffeine.     Review of Systems: A 12 point ROS discussed and pertinent positives are indicated in the HPI above.  All other systems are negative.  Review of Systems  Vital Signs: BP (!) 141/94   Pulse 86   Temp 97.9 F (36.6 C) (Oral)   Resp 20   Ht 5\' 3"  (1.6 m)   Wt (!) 309 lb 4.9 oz (140.3 kg)   SpO2 94%   BMI 54.79 kg/m   Physical Exam  Constitutional: She is oriented to person, place, and time.  Obese, nontoxic appearing.   HENT:  Head: Normocephalic.  Mouth/Throat: Oropharynx is clear and moist.  Neck: Normal range of motion. No JVD present. No tracheal deviation present.  Cardiovascular: Normal rate, regular rhythm and normal heart sounds.  Pulmonary/Chest: Effort normal. No respiratory distress.  No obvious resp distress but O2 sats do drop when pt speaking or moving about in bed.  Neurological: She is alert and oriented to person, place, and time.  Skin: Skin is warm and dry.  Psychiatric: She has a normal mood and affect.    Imaging: Dg Chest 2 View  Result Date: 12/28/2017 CLINICAL DATA:  Increasing shortness of breath EXAM: CHEST - 2 VIEW COMPARISON:  10/06/2017 FINDINGS: Cardiac shadow is stable. Aortic calcifications are again seen. Mild increased  vascular congestion is noted without interstitial edema. The lungs are well aerated bilaterally. No focal infiltrate or sizable effusion is seen. Mild degenerative changes of the thoracic spine are noted. IMPRESSION: Mild increased vascular congestion without edema. No other focal abnormality is seen. Electronically Signed   By: Inez Catalina M.D.   On: 12/28/2017 22:01   Ct Angio Chest Pe W And/or Wo Contrast  Result Date: 12/29/2017 CLINICAL DATA:  Shortness of breath and chest pain EXAM: CT ANGIOGRAPHY CHEST WITH CONTRAST TECHNIQUE: Multidetector CT imaging of the chest was performed using the standard protocol during bolus administration of intravenous contrast. Multiplanar CT image reconstructions and MIPs were obtained to evaluate the vascular anatomy. CONTRAST:  19mL ISOVUE-370 IOPAMIDOL (ISOVUE-370) INJECTION 76% COMPARISON:  Chest radiograph 12/28/2017 FINDINGS: Cardiovascular: Contrast injection is sufficient to demonstrate satisfactory opacification of the pulmonary arteries to the segmental level.There is large volume pulmonary embolus within both distal main pulmonary arteries and extending into the lobar and segmental branches. There is evidence of right heart strain with an RV/LV ratio of approximately 3. The main pulmonary artery is enlarged, measuring 4 cm at the bifurcation. There is a normal 3-vessel arch branching pattern without evidence of acute aortic syndrome. There is mildaortic atherosclerosis. Heart size is normal, without pericardial effusion. Mediastinum/Nodes: No mediastinal, hilar or axillary lymphadenopathy. The visualized thyroid and thoracic esophageal course are unremarkable. Lungs/Pleura: No pulmonary nodules or masses. No pleural effusion or pneumothorax. No focal airspace consolidation. No focal pleural abnormality. Upper Abdomen: Contrast bolus timing is not optimized for evaluation of the abdominal organs. Within this limitation, the visualized organs of the upper abdomen  are normal. Musculoskeletal: No chest wall abnormality. No acute or significant osseous findings. Review of the MIP images confirms the above findings. IMPRESSION: 1. Positive for acute bilateral main pulmonary artery embolus with CT evidence of right heart strain (RV/LV Ratio = 3), consistent with at least submassive (intermediate risk) PE. Large volume emboli extend into the lobar and segmental arteries of both lungs. The presence of right heart strain has been associated  with an increased risk of morbidity and mortality. Please activate Code PE by paging [phone]. 2.  Aortic Atherosclerosis (ICD10-I70.0). Critical Value/emergent results were called by telephone at the time of interpretation on 12/29/2017 at 3:07 am to [facility] , who verbally acknowledged these results. Electronically Signed   By: Ulyses Jarred M.D.   On: 12/29/2017 03:10    Labs:  CBC: Recent Labs    02/25/17 1206 10/06/17 1816 11/25/17 1026 12/28/17 2126  WBC 4.7 3.5* 4.7 6.9  HGB 11.9* 13.2  --  12.4  HCT 35.3* 38.7 36.8 36.9  PLT 228 201 238 161    COAGS: No results for input(s): INR, APTT in the last 8760 hours.  BMP: Recent Labs    10/06/17 1816 11/25/17 1026 12/28/17 2126 12/29/17 0442  NA 139 140 137 139  K 3.5 3.6 3.0* 3.6  CL 104 103 100* 102  CO2 23 29 22 26   GLUCOSE 98 111 183* 112*  BUN 9 11 11 11   CALCIUM 9.5 9.8 9.4 9.2  CREATININE 0.81 0.96 1.07* 0.90  GFRNONAA >60 59* 52* >60  GFRAA >60 >60 60* >60    LIVER FUNCTION TESTS: Recent Labs    11/25/17 1026 12/28/17 2126  BILITOT 0.4 1.2  AST 19 33  ALT 11 11*  ALKPHOS 74 63  PROT 7.8 8.0  ALBUMIN 3.6 3.6    TUMOR MARKERS: No results for input(s): AFPTM, CEA, CA199, CHROMGRNA in the last 8760 hours.  Assessment and Plan: Submassive bilateral PE with right heart strain. Feel pt is good candidate for catheter directed thrombolysis procedure. Low risks for bleeding, no contraindications. Discussed the procedure in detail  as well as the intended benefit of reducing the heart strain. Combined discussion with Dr. Lamonte Sakai at the time of the consult, the pt is anxious but agreeable to proceed. Risks and benefits discussed with the patient including, but not limited to bleeding, possible life threatening bleeding and need for blood product transfusion, vascular injury, stroke, contrast induced renal failure, limb loss and infection.  All of the patient's questions were answered, patient is agreeable to proceed. Consent signed and in chart.    Thank you for this interesting consult.  I greatly enjoyed meeting Sheila Jacobs and look forward to participating in their care.  A copy of this report was sent to the requesting provider on this date.  Electronically Signed: Ascencion Dike, PA-C 12/29/2017, 9:17 AM   I spent a total of 40 minutes in face to face in clinical consultation, greater than 50% of which was counseling/coordinating care for PE lysis

## 2017-12-29 NOTE — ED Provider Notes (Signed)
Pt's care turned over to me 7am.  Critical care here at 6:30.  They advised [facility] to admit if IR chooses no to do procedure.  8:15 IR here to see pt. Dr. Carson Myrtle advised IR is here to be seen.  Pt to be admitted to ICU.   Fransico Meadow, [place] 12/29/17 7939    Fatima Blank, MD 12/29/17 2012

## 2017-12-29 NOTE — Progress Notes (Signed)
  Echocardiogram 2D Echocardiogram has been performed.  Sheila Jacobs T [name] 12/29/2017, 8:36 AM

## 2017-12-29 NOTE — Progress Notes (Signed)
ANTICOAGULATION CONSULT NOTE - Initial Consult  Pharmacy Consult for heparin Indication: pulmonary embolus  Allergies  Allergen Reactions  . Cortisone Rash  . Effexor [Venlafaxine] Palpitations  . Lexapro [Escitalopram] Palpitations    Heart racing    Patient Measurements: Height: 5\' 3"  (160 cm) Weight: (!) 309 lb 4.9 oz (140.3 kg) IBW/kg (Calculated) : 52.4 Heparin Dosing Weight: 90kg  Vital Signs: Temp: 97.9 F (36.6 C) (04/20 2117) Temp Source: Oral (04/20 2117) BP: 123/84 (04/21 0200) Pulse Rate: 90 (04/21 0200)  Labs: Recent Labs    12/28/17 2126  HGB 12.4  HCT 36.9  PLT 161  CREATININE 1.07*    Estimated Creatinine Clearance: 68.6 mL/min (A) (by C-G formula based on SCr of 1.07 mg/dL (H)).   Medical History: Past Medical History:  Diagnosis Date  . Anginal pain ([place])   . Anxiety   . Arthritis   . Breast cancer ([place])   . Breast cancer of upper-outer quadrant of left female breast ([place]) 01/31/2016  . Colon polyps   . Enlarged LA (left atrium)   . GERD (gastroesophageal reflux disease)   . Hard of hearing    Wears hearing aids.  . Headache   . Heavy cigarette smoker    Quit 2015  . History of radiation therapy 06/18/16- 07/30/16   Left Breast 45 Gy in 25 fractions, Left Breast Boost 6 Gy in 3 fractions.   . Hyperlipidemia   . Hypertension   . Morbid obesity ([place])   . Numbness and tingling   . Obesity   . Thyroid goiter   . Vertigo    on occasion     Assessment: 67yo female c/o worsening SOB, D-dimer found to be elevated >20, CT reveals PE w/ evidences of RHS, to begin heparin.  Goal of Therapy:  Heparin level 0.3-0.7 units/ml Monitor platelets by anticoagulation protocol: Yes   Plan:  Will give heparin 5000 units IV bolus x1 followed by gtt at 1600 units/hr and monitor heparin levels and CBC.  Wynona Neat, PharmD, BCPS  12/29/2017,3:19 AM

## 2017-12-29 NOTE — Consult Note (Signed)
Name: Sheila Jacobs MRN: [id number] DOB: [birth year]    ADMISSION DATE:  12/28/2017 CONSULTATION DATE:  12/29/2017  REFERRING MD :  Dr. Thomasene Lot   CHIEF COMPLAINT:  PE   HISTORY OF PRESENT ILLNESS:   67 year old female with PMH of Breast CA s/p Lumpectomy/Radiation in 2017, HTN, Thyroid Goiter s/p Thyroidectomy, Hypothyroidism, GERD    Presents to ED on 4/20 with progressive dyspnea. Patient reports that for weeks she has been short of breath however tonight it seemed to get worse. Upon arrival to ED Oxygen Saturation 70% on room air with increased WOB. CTA with saddle PE with RV/LV of 3 and Troponin 0.77. PCCM asked to evaluate.    Patient states that she has not had a recent fall or surgery. Does state that overall she lives a sedentary lifestyle. Denies pain or swelling to lower extremities. Denies recent travel. States she followed up last with Oncology in March 2019 where she was clear of cancer.   SIGNIFICANT EVENTS  4/20 > Presents to ED   STUDIES:  CXR 4/20 > Cardiac shadow is stable. Aortic calcifications are again seen. Mild increased vascular congestion is noted without interstitial edema. The lungs are well aerated bilaterally. No focal infiltrate or sizable effusion is seen. Mild degenerative changes of the thoracic spine are noted. CTA Chest 4/21 > Cardiovascular: Contrast injection is sufficient to demonstrate satisfactory opacification of the pulmonary arteries to the segmental level.There is large volume pulmonary embolus within both distal main pulmonary arteries and extending into the lobar and segmental branches. There is evidence of right heart strain with an RV/LV ratio of approximately 3. The main pulmonary artery is enlarged, measuring 4 cm at the bifurcation. There is a normal 3-vessel arch branching pattern without evidence of acute aortic syndrome. There is mildaortic atherosclerosis. Heart size is normal, without pericardial effusion.  PAST MEDICAL  HISTORY :   has a past medical history of Anginal pain ([place]), Anxiety, Arthritis, Breast cancer ([place]), Breast cancer of upper-outer quadrant of left female breast ([place]) (01/31/2016), Colon polyps, Enlarged LA (left atrium), GERD (gastroesophageal reflux disease), Hard of hearing, Headache, Heavy cigarette smoker, History of radiation therapy (06/18/16- 07/30/16), Hyperlipidemia, Hypertension, Morbid obesity ([place]), Numbness and tingling, Obesity, Thyroid goiter, and Vertigo.  has a past surgical history that includes Tubal ligation; left heart catheterization with coronary angiogram (N/A, 10/21/2014); Colonoscopy with propofol (N/A, 10/20/2015); Esophagogastroduodenoscopy (egd) with propofol (N/A, 10/20/2015); Breast lumpectomy with radioactive seed and sentinel lymph node biopsy (Left, 04/11/2016); Thyroidectomy (N/A, 02/26/2017); and Breast lumpectomy (Left, 2017). Prior to Admission medications   Medication Sig Start Date End Date Taking? Authorizing Provider  ALPRAZolam (XANAX) 0.25 MG tablet TAKE ONE TABLET BY MOUTH TWICE DAILY AS NEEDED FOR ANXIETY 01/07/17  Yes Henson, Vickie L, NP-C  anastrozole (ARIMIDEX) 1 MG tablet Take 1 tablet (1 mg total) by mouth daily. 11/25/17  Yes Nicholas Lose, MD  aspirin EC 81 MG tablet Take 81 mg by mouth daily.   Yes [provider]  hydrochlorothiazide (HYDRODIURIL) 25 MG tablet Take 25 mg by mouth daily.   Yes [provider]  levothyroxine (SYNTHROID, LEVOTHROID) 150 MCG tablet Take 1 tablet (150 mcg total) by mouth daily. 08/29/17  Yes Elayne Snare, MD  pantoprazole (PROTONIX) 40 MG tablet TAKE ONE TABLET BY MOUTH ONCE DAILY 10/28/17  Yes Burtis Junes, NP  ketoconazole (NIZORAL) 2 % cream APPLY TOPICALLY DAILY AS NEEDED FOR IRRITATION Patient not taking: Reported on 12/28/2017 01/07/17   Wallene Huh, DPM  Allergies  Allergen Reactions  . Cortisone Rash  . Effexor [Venlafaxine] Palpitations  . Lexapro [Escitalopram] Palpitations    Heart racing      FAMILY HISTORY:  family history includes Cataracts in her brother and mother; Diabetes in her mother and sister; Hearing loss in her brother and mother; Heart disease in her mother; Hypertension in her mother; Kidney disease in her mother; Liver disease in her mother. SOCIAL HISTORY:  reports that she quit smoking about 3 years ago. Her smoking use included cigarettes. She has a 45.00 pack-year smoking history. She has never used smokeless tobacco. She reports that she does not drink alcohol or use drugs.  REVIEW OF SYSTEMS:   All negative; except for those that are bolded, which indicate positives.  Constitutional: weight loss, weight gain, night sweats, fevers, chills, fatigue, weakness.  HEENT: headaches, sore throat, sneezing, nasal congestion, post nasal drip, difficulty swallowing, tooth/dental problems, visual complaints, visual changes, ear aches. Neuro: difficulty with speech, weakness, numbness, ataxia. CV:  chest pain, orthopnea, PND, swelling in lower extremities, dizziness, palpitations, syncope.  Resp: cough, hemoptysis, dyspnea, wheezing. GI: heartburn, indigestion, abdominal pain, nausea, vomiting, diarrhea, constipation, change in bowel habits, loss of appetite, hematemesis, melena, hematochezia.  GU: dysuria, change in color of urine, urgency or frequency, flank pain, hematuria. MSK: joint pain or swelling, decreased range of motion. Psych: change in mood or affect, depression, anxiety, suicidal ideations, homicidal ideations. Skin: rash, itching, bruising.   SUBJECTIVE:   VITAL SIGNS: Temp:  [97.9 F (36.6 C)] 97.9 F (36.6 C) (04/20 2117) Pulse Rate:  [79-104] 85 (04/21 0415) Resp:  [11-25] 18 (04/21 0415) BP: (103-188)/(66-91) 124/83 (04/21 0415) SpO2:  [91 %-98 %] 94 % (04/21 0415) Weight:  [140.3 kg (309 lb 4.9 oz)] 140.3 kg (309 lb 4.9 oz) (04/21 0300)  PHYSICAL EXAMINATION: General:  Obese Adult female, mild respiratory distress  Neuro:  Alert,  oriented, follows commands  HEENT:  MMM Cardiovascular:  RRR, no MRG Lungs:  Diminished breath sounds, tachypnea  Abdomen:  Obese, active bowel sounds Musculoskeletal:  -edema  Skin:  Warm, dry, intact   Recent Labs  Lab 12/28/17 2126  NA 137  K 3.0*  CL 100*  CO2 22  BUN 11  CREATININE 1.07*  GLUCOSE 183*   Recent Labs  Lab 12/28/17 2126  HGB 12.4  HCT 36.9  WBC 6.9  PLT 161   Dg Chest 2 View  Result Date: 12/28/2017 CLINICAL DATA:  Increasing shortness of breath EXAM: CHEST - 2 VIEW COMPARISON:  10/06/2017 FINDINGS: Cardiac shadow is stable. Aortic calcifications are again seen. Mild increased vascular congestion is noted without interstitial edema. The lungs are well aerated bilaterally. No focal infiltrate or sizable effusion is seen. Mild degenerative changes of the thoracic spine are noted. IMPRESSION: Mild increased vascular congestion without edema. No other focal abnormality is seen. Electronically Signed   By: Inez Catalina M.D.   On: 12/28/2017 22:01   Ct Angio Chest Pe W And/or Wo Contrast  Result Date: 12/29/2017 CLINICAL DATA:  Shortness of breath and chest pain EXAM: CT ANGIOGRAPHY CHEST WITH CONTRAST TECHNIQUE: Multidetector CT imaging of the chest was performed using the standard protocol during bolus administration of intravenous contrast. Multiplanar CT image reconstructions and MIPs were obtained to evaluate the vascular anatomy. CONTRAST:  126mL ISOVUE-370 IOPAMIDOL (ISOVUE-370) INJECTION 76% COMPARISON:  Chest radiograph 12/28/2017 FINDINGS: Cardiovascular: Contrast injection is sufficient to demonstrate satisfactory opacification of the pulmonary arteries to the segmental level.There is large volume pulmonary embolus within  both distal main pulmonary arteries and extending into the lobar and segmental branches. There is evidence of right heart strain with an RV/LV ratio of approximately 3. The main pulmonary artery is enlarged, measuring 4 cm at the bifurcation.  There is a normal 3-vessel arch branching pattern without evidence of acute aortic syndrome. There is mildaortic atherosclerosis. Heart size is normal, without pericardial effusion. Mediastinum/Nodes: No mediastinal, hilar or axillary lymphadenopathy. The visualized thyroid and thoracic esophageal course are unremarkable. Lungs/Pleura: No pulmonary nodules or masses. No pleural effusion or pneumothorax. No focal airspace consolidation. No focal pleural abnormality. Upper Abdomen: Contrast bolus timing is not optimized for evaluation of the abdominal organs. Within this limitation, the visualized organs of the upper abdomen are normal. Musculoskeletal: No chest wall abnormality. No acute or significant osseous findings. Review of the MIP images confirms the above findings. IMPRESSION: 1. Positive for acute bilateral main pulmonary artery embolus with CT evidence of right heart strain (RV/LV Ratio = 3), consistent with at least submassive (intermediate risk) PE. Large volume emboli extend into the lobar and segmental arteries of both lungs. The presence of right heart strain has been associated with an increased risk of morbidity and mortality. Please activate Code PE by paging [phone]. 2.  Aortic Atherosclerosis (ICD10-I70.0). Critical Value/emergent results were called by telephone at the time of interpretation on 12/29/2017 at 3:07 am to [facility] , who verbally acknowledged these results. Electronically Signed   By: Ulyses Jarred M.D.   On: 12/29/2017 03:10    ASSESSMENT / PLAN:  Acute Hypoxic Respiratory Failure in setting of saddle pulmonary embolism with heart strain  RV/LV 3, Troponin 0.77  Plan  -Wean Supplemental Oxygen to Maintain Saturation >92 -Continue Heparin gtt  -IR consulted to evaluate patient for EKOS given RV/LF of 3 and Positive Troponin with hypoxia  -LE Dopplers and ECHO pending   Lactic Acidosis in setting of hypoperfusion  Hypokalemia  Plan  -Trend LA  -Trend BMP    -Replace Electrolytes as indicated   H/O HTN Plan  -Cardiac Monitoring  -Maintain Systolic <242 -Hold Hydrochlorothiazide   GERD Plan  -Continue home Protonix   H/O Hypothyroidism  Plan  -Continue Synthroid   Hyperglycemia  Plan  -Send Hemoglobin A1C -Trend Glucose  -SSI   Hayden Pedro, AGACNP-BC [facility]  Pgr: [phone]  PCCM Pgr: [phone]   Attending Note:  I have examined patient, reviewed labs, studies and notes. I have discussed the case with Beaulah Corin, and I agree with the data and plans as amended above.   67 yo woman with hx treated breast CA (2017), presented with hypoxemia, dyspnea. Found to have large bilateral PE with high clot burden, RV/LV ratio 3. She is hypoxemic on 5L/min [facility]. Echocardiogram is being performed now. Troponin positive at 0.77.   Vitals:   12/29/17 0645 12/29/17 0700 12/29/17 0730 12/29/17 0745  BP: 121/83 139/84  133/78  Pulse: 84 87 87 83  Resp: 18 18 (!) 23 19  Temp:      TempSrc:      SpO2: 93% 94% 92% 94%  Weight:      Height:       On eval she is  A pleasant obese woman, NAD, comfortable resp pattern on 5L/min but gets dyspneic when speaking or moving. Distant BS without any wheeze. Heart regular, no tachycardia, no M. Obese abdomen, + BS. No LE edema.   Based on her clot burden, RV/LV ratio, troponin I believe that she is an  appropriate candidate for EKOS to optimize future RV fxn, CO, resp function. She is speaking with IR about the procedure now. If she agrees then we will admit to ICU to complete the treatment. She is on heparin gtt currently. Will initiate discussions regarding her outpatient anticoagulation choices after EKOS completed.   Independent critical care time is 40 minutes.   Baltazar Apo, MD, PhD 12/29/2017, 8:22 AM [facility] [phone] or if no answer [phone]

## 2017-12-29 NOTE — Progress Notes (Signed)
[place] for heparin Indication: pulmonary embolus  Allergies  Allergen Reactions  . Cortisone Rash  . Effexor [Venlafaxine] Palpitations  . Lexapro [Escitalopram] Palpitations    Heart racing   Patient Measurements: Height: 5\' 3"  (160 cm) Weight: 295 lb 6.7 oz (134 kg) IBW/kg (Calculated) : 52.4 Heparin Dosing Weight: 90kg  Vital Signs: Temp: 97.8 F (36.6 C) (04/21 1225) Temp Source: Oral (04/21 1225) BP: 109/83 (04/21 1400) Pulse Rate: 67 (04/21 1400)  Labs: Recent Labs    12/28/17 2126 12/29/17 0442 12/29/17 0937  HGB 12.4  --   --   HCT 36.9  --   --   PLT 161  --   --   LABPROT  --   --  15.1  INR  --   --  1.20  HEPARINUNFRC  --   --  0.23*  CREATININE 1.07* 0.90  --    Estimated Creatinine Clearance: 79.2 mL/min (by C-G formula based on SCr of 0.9 mg/dL).  Medical History: Past Medical History:  Diagnosis Date  . Anginal pain ([place])   . Anxiety   . Arthritis   . Breast cancer ([place])   . Breast cancer of upper-outer quadrant of left female breast ([place]) 01/31/2016  . Colon polyps   . Enlarged LA (left atrium)   . GERD (gastroesophageal reflux disease)   . Hard of hearing    Wears hearing aids.  . Headache   . Heavy cigarette smoker    Quit 2015  . History of radiation therapy 06/18/16- 07/30/16   Left Breast 45 Gy in 25 fractions, Left Breast Boost 6 Gy in 3 fractions.   . Hyperlipidemia   . Hypertension   . Morbid obesity ([place])   . Numbness and tingling   . Obesity   . Thyroid goiter   . Vertigo    on occasion    Assessment: 67yo female c/o worsening SOB, D-dimer found to be elevated >20, CT reveals PE w/ evidences of RHS, to begin heparin. Patient went to IR this morning and currently undergoing EKOS. CBC WNL last evening.   Heparin level subtherapeutic: 0.23, no overt bleeding or infusion related issues reported. The patient only has one peripheral IV and the RN will not be able to get additional IV  access until after the tPA has finished infusing. For the anticipated q6 hour  heparin and fibrinogen levels, the RN was instructed to pause the heparin infusion and flush the line prior to blood collection.    Goal of Therapy:  Heparin level 0.3-0.7 units/ml Monitor platelets by anticoagulation protocol: Yes   Plan:  Increase heparin infusion to 1800 units/hr  Monitor heparin levels every 6 hours while tPA infusing  Daily heparin level and CBC Monitor for s/sx of bleeding  Georga Bora, PharmD Clinical Pharmacist 12/29/2017 2:34 PM

## 2017-12-29 NOTE — ED Notes (Signed)
Pt. Transported to CT 

## 2017-12-30 ENCOUNTER — Encounter (HOSPITAL_COMMUNITY): Payer: Self-pay | Admitting: Interventional Radiology

## 2017-12-30 ENCOUNTER — Inpatient Hospital Stay (HOSPITAL_COMMUNITY): Payer: Medicare Other

## 2017-12-30 DIAGNOSIS — R0602 Shortness of breath: Secondary | ICD-10-CM

## 2017-12-30 DIAGNOSIS — R609 Edema, unspecified: Secondary | ICD-10-CM

## 2017-12-30 DIAGNOSIS — I2692 Saddle embolus of pulmonary artery without acute cor pulmonale: Secondary | ICD-10-CM

## 2017-12-30 HISTORY — PX: IR THROMB F/U EVAL ART/VEN FINAL DAY (MS): IMG5379

## 2017-12-30 LAB — BASIC METABOLIC PANEL
ANION GAP: 11 (ref 5–15)
Anion gap: 7 (ref 5–15)
BUN: 6 mg/dL (ref 6–20)
BUN: 7 mg/dL (ref 6–20)
CALCIUM: 8.1 mg/dL — AB (ref 8.9–10.3)
CALCIUM: 9.1 mg/dL (ref 8.9–10.3)
CO2: 23 mmol/L (ref 22–32)
CO2: 18 mmol/L — ABNORMAL LOW (ref 22–32)
CREATININE: 0.72 mg/dL (ref 0.44–1.00)
CREATININE: 0.8 mg/dL (ref 0.44–1.00)
Chloride: 107 mmol/L (ref 101–111)
Chloride: 112 mmol/L — ABNORMAL HIGH (ref 101–111)
GFR calc non Af Amer: >60 mL/min (ref >60)
Glucose, Bld: 104 mg/dL — ABNORMAL HIGH (ref 65–99)
Glucose, Bld: 138 mg/dL — ABNORMAL HIGH (ref 65–99)
Potassium: 2.9 mmol/L — ABNORMAL LOW (ref 3.5–5.1)
Potassium: 5.4 mmol/L — ABNORMAL HIGH (ref 3.5–5.1)
SODIUM: 141 mmol/L (ref 135–145)
Sodium: 137 mmol/L (ref 135–145)

## 2017-12-30 LAB — FIBRINOGEN
Fibrinogen: 200 mg/dL — ABNORMAL LOW (ref 210–475)
Fibrinogen: 254 mg/dL (ref 210–475)

## 2017-12-30 LAB — CBC
HCT: 30.5 % — ABNORMAL LOW (ref 36.0–46.0)
HEMATOCRIT: 32.1 % — AB (ref 36.0–46.0)
HEMOGLOBIN: 10.3 g/dL — AB (ref 12.0–15.0)
Hemoglobin: 10.1 g/dL — ABNORMAL LOW (ref 12.0–15.0)
MCH: 32.3 pg (ref 26.0–34.0)
MCH: 31.4 pg (ref 26.0–34.0)
MCHC: 33.1 g/dL (ref 30.0–36.0)
MCHC: 32.1 g/dL (ref 30.0–36.0)
MCV: 97.4 fL (ref 78.0–100.0)
MCV: 97.9 fL (ref 78.0–100.0)
PLATELETS: 101 10*3/uL — AB (ref 150–400)
Platelets: 77 10*3/uL — ABNORMAL LOW (ref 150–400)
RBC: 3.13 MIL/uL — ABNORMAL LOW (ref 3.87–5.11)
RBC: 3.28 MIL/uL — ABNORMAL LOW (ref 3.87–5.11)
RDW: 13.5 % (ref 11.5–15.5)
RDW: 13.4 % (ref 11.5–15.5)
WBC: 5.5 10*3/uL (ref 4.0–10.5)
WBC: 5.8 10*3/uL (ref 4.0–10.5)

## 2017-12-30 LAB — GLUCOSE, CAPILLARY
GLUCOSE-CAPILLARY: 88 mg/dL (ref 65–99)
GLUCOSE-CAPILLARY: 108 mg/dL — AB (ref 65–99)

## 2017-12-30 LAB — PHOSPHORUS: PHOSPHORUS: 2.6 mg/dL (ref 2.5–4.6)

## 2017-12-30 LAB — HEPARIN LEVEL (UNFRACTIONATED)
HEPARIN UNFRACTIONATED: 0.22 [IU]/mL — AB (ref 0.30–0.70)
Heparin Unfractionated: >2.2 IU/mL — ABNORMAL HIGH (ref 0.30–0.70)

## 2017-12-30 LAB — MAGNESIUM: Magnesium: 1.8 mg/dL (ref 1.7–2.4)

## 2017-12-30 MED ORDER — POTASSIUM CHLORIDE CRYS ER 20 MEQ PO TBCR
40.0000 meq | EXTENDED_RELEASE_TABLET | ORAL | Status: AC
  Administered 2017-12-30 (×2): 40 meq via ORAL
  Filled 2017-12-30 (×2): qty 2

## 2017-12-30 MED ORDER — RIVAROXABAN 15 MG PO TABS
15.0000 mg | ORAL_TABLET | Freq: Two times a day (BID) | ORAL | Status: DC
  Administered 2017-12-30 – 2018-01-03 (×9): 15 mg via ORAL
  Filled 2017-12-30 (×10): qty 1

## 2017-12-30 MED ORDER — RIVAROXABAN 20 MG PO TABS: 20.0000 mg | ORAL_TABLET | Freq: Every day | ORAL | Status: DC

## 2017-12-30 NOTE — Progress Notes (Signed)
Went into pt. Room to obtain blood from PIV, noticed a large swollen hard bruised area about 2 inches about PIV insertion site. Attempted to pull back blood with success and PIV flushed without pt. Complaint. Although blood return from PIV, this RN still felt it was possibly infiltrated. Contacted MD Posey Pronto to obtain order for new PIV since s/p EKOS and TPA infusion. TPA was discontinued around 2230 and transitioned to NS. Received orders to place new PIV. Contacted IV therapy for a second opinion. Although this looks strange to be a infiltration, it was felt to remove the PIV and start new IV to be safe. New IV inserted by IV therapy and Heparin gtt was then moved to new site. No complications noted other than pt. States area is sore and IV therapy recommended to use heat and elevate right arm which was completed.

## 2017-12-30 NOTE — Progress Notes (Signed)
Per insurance check on Xarelto vs Eliquis  # 5.  PATIENT HAS: MEDICAID OF [place]      EFF-DATE- 05-11-2017      CO-PAY- $ 3.80 FOR EACH RX

## 2017-12-30 NOTE — Progress Notes (Signed)
ANTICOAGULATION CONSULT NOTE - Follow Up Consult  Pharmacy Consult for heparin Indication: pulmonary embolus  Labs: Recent Labs    12/28/17 2126 12/29/17 0442  12/29/17 0937 12/29/17 1715 12/29/17 1830 12/30/17 0317 12/30/17 0520  HGB 12.4  --   --   --  11.4*  --  10.1*  --   HCT 36.9  --   --   --  34.3*  --  30.5*  --   PLT 161  --   --   --  124*  --  101*  --   LABPROT  --   --   --  15.1  --   --   --   --   INR  --   --   --  1.20  --   --   --   --   HEPARINUNFRC  --   --    < > 0.23*  --  0.25* >2.20* 0.22*  CREATININE 1.07* 0.90  --   --   --   --  0.72  --    < > = values in this interval not displayed.     Assessment/Plan:  67yo female subtherapeutic on heparin though IV had been lost x1h. Will continue gtt at current rate and check additional level.   Wynona Neat, PharmD, BCPS  12/30/2017,6:18 AM

## 2017-12-30 NOTE — Progress Notes (Signed)
Bilateral lower extremity venous duplex completed. Preliminary results. Technically difficu.lt due to body habitus. Positive for a DVT noted in the proximal right popliteal vein. All other veins imaged bilaterally apear free of DVT and superficial thrombosis. [facility], [place] 12/30/2017 3:56 PM

## 2017-12-30 NOTE — Progress Notes (Signed)
ANTICOAGULATION CONSULT NOTE - Initial Consult  Pharmacy Consult for Change IV Heparin to Xarelto Indication: pulmonary embolus  Allergies  Allergen Reactions  . Cortisone Rash  . Effexor [Venlafaxine] Palpitations  . Lexapro [Escitalopram] Palpitations    Heart racing    Patient Measurements: Height: 5\' 3"  (160 cm) Weight: 295 lb 6.7 oz (134 kg) IBW/kg (Calculated) : 52.4   Vital Signs: Temp: 98.1 F (36.7 C) (04/22 0720) Temp Source: Oral (04/22 0720) BP: 104/66 (04/22 0700) Pulse Rate: 64 (04/22 0700)  Labs: Recent Labs    12/28/17 2126 12/29/17 0442  12/29/17 0937 12/29/17 1715 12/29/17 1830 12/30/17 0317 12/30/17 0520  HGB 12.4  --   --   --  11.4*  --  10.1*  --   HCT 36.9  --   --   --  34.3*  --  30.5*  --   PLT 161  --   --   --  124*  --  101*  --   LABPROT  --   --   --  15.1  --   --   --   --   INR  --   --   --  1.20  --   --   --   --   HEPARINUNFRC  --   --    < > 0.23*  --  0.25* >2.20* 0.22*  CREATININE 1.07* 0.90  --   --   --   --  0.72  --    < > = values in this interval not displayed.    Estimated Creatinine Clearance: 89.1 mL/min (by C-G formula based on SCr of 0.72 mg/dL).   Medical History: Past Medical History:  Diagnosis Date  . Anginal pain ([place])   . Anxiety   . Arthritis   . Breast cancer ([place])   . Breast cancer of upper-outer quadrant of left female breast ([place]) 01/31/2016  . Colon polyps   . Enlarged LA (left atrium)   . GERD (gastroesophageal reflux disease)   . Hard of hearing    Wears hearing aids.  . Headache   . Heavy cigarette smoker    Quit 2015  . History of radiation therapy 06/18/16- 07/30/16   Left Breast 45 Gy in 25 fractions, Left Breast Boost 6 Gy in 3 fractions.   . Hyperlipidemia   . Hypertension   . Morbid obesity ([place])   . Numbness and tingling   . Obesity   . Thyroid goiter   . Vertigo    on occasion    Assessment: 67 year old female with acute saddle pulmonary embolism on IV Heparin s/p  EKOS and tPA. Sheaths have been removed as of 9:36 AM. Now to switch to Xarelto.   H/H is slowly trending down at 10.1/30.5.  Platelets are trending down at 101 (down from 161 on 4/20 PM in setting of recent alteplase via catheters in EKOS. Fibrinogen 200.  Goal of Therapy:  Monitor platelets by anticoagulation protocol: Yes   Plan:  Stop Heparin Start Xarelto 15 mg po BID x21 days then 20 mg po daily with supper.  Monitor CBC Follow-up for education  Sloan Leiter, PharmD, BCPS, BCCCP Clinical Pharmacist Clinical phone 12/30/2017 until 3:30PM - [phone] After hours, please call #[phone] 12/30/2017,11:43 AM

## 2017-12-30 NOTE — H&P (Signed)
Name: Sheila Jacobs MRN:   [id number] DOB:   1951/08/26             ADMISSION DATE:  12/28/2017 CONSULTATION DATE:  12/29/2017  REFERRING MD :  Dr. Thomasene Lot   CHIEF COMPLAINT:  PE   HISTORY OF PRESENT ILLNESS:   67 year old female with PMH of Breast CA s/p Lumpectomy/Radiation in 2017, HTN, Thyroid Goiter s/p Thyroidectomy, Hypothyroidism, GERD    Presents to ED on 4/20 with progressive dyspnea. Patient reports that for weeks she has been short of breath however tonight it seemed to get worse. Upon arrival to ED Oxygen Saturation 70% on room air with increased WOB. CTA with saddle PE with RV/LV of 3 and Troponin 0.77. PCCM asked to evaluate.    Patient states that she has not had a recent fall or surgery. Does state that overall she lives a sedentary lifestyle. Denies pain or swelling to lower extremities. Denies recent travel. States she followed up last with Oncology in March 2019 where she was clear of cancer.   SIGNIFICANT EVENTS  4/20 > Presents to ED   STUDIES:  CXR 4/20 > Cardiac shadow is stable. Aortic calcifications are again seen. Mild increased vascular congestion is noted without interstitial edema. The lungs are well aerated bilaterally. No focal infiltrate or sizable effusion is seen. Mild degenerative changes of the thoracic spine are noted. CTA Chest 4/21 > Cardiovascular: Contrast injection is sufficient to demonstrate satisfactory opacification of the pulmonary arteries to the segmental level.There is large volume pulmonary embolus within both distal main pulmonary arteries and extending into the lobar and segmental branches. There is evidence of right heart strain with an RV/LV ratio of approximately 3. The main pulmonary artery is enlarged, measuring 4 cm at the bifurcation. There is a normal 3-vessel arch branching pattern without evidence of acute aortic syndrome. There is mildaortic atherosclerosis. Heart size is normal, without pericardial  effusion.  PAST MEDICAL HISTORY :   has a past medical history of Anginal pain ([place]), Anxiety, Arthritis, Breast cancer ([place]), Breast cancer of upper-outer quadrant of left female breast ([place]) (01/31/2016), Colon polyps, Enlarged LA (left atrium), GERD (gastroesophageal reflux disease), Hard of hearing, Headache, Heavy cigarette smoker, History of radiation therapy (06/18/16- 07/30/16), Hyperlipidemia, Hypertension, Morbid obesity ([place]), Numbness and tingling, Obesity, Thyroid goiter, and Vertigo.  has a past surgical history that includes Tubal ligation; left heart catheterization with coronary angiogram (N/A, 10/21/2014); Colonoscopy with propofol (N/A, 10/20/2015); Esophagogastroduodenoscopy (egd) with propofol (N/A, 10/20/2015); Breast lumpectomy with radioactive seed and sentinel lymph node biopsy (Left, 04/11/2016); Thyroidectomy (N/A, 02/26/2017); and Breast lumpectomy (Left, 2017).        Prior to Admission medications   Medication Sig Start Date End Date Taking? Authorizing Provider  ALPRAZolam (XANAX) 0.25 MG tablet TAKE ONE TABLET BY MOUTH TWICE DAILY AS NEEDED FOR ANXIETY 01/07/17  Yes Henson, Vickie L, NP-C  anastrozole (ARIMIDEX) 1 MG tablet Take 1 tablet (1 mg total) by mouth daily. 11/25/17  Yes Nicholas Lose, MD  aspirin EC 81 MG tablet Take 81 mg by mouth daily.   Yes [provider]  hydrochlorothiazide (HYDRODIURIL) 25 MG tablet Take 25 mg by mouth daily.   Yes [provider]  levothyroxine (SYNTHROID, LEVOTHROID) 150 MCG tablet Take 1 tablet (150 mcg total) by mouth daily. 08/29/17  Yes Elayne Snare, MD  pantoprazole (PROTONIX) 40 MG tablet TAKE ONE TABLET BY MOUTH ONCE DAILY 10/28/17  Yes Burtis Junes, NP  ketoconazole (NIZORAL) 2 % cream APPLY TOPICALLY  DAILY AS NEEDED FOR IRRITATION Patient not taking: Reported on 12/28/2017 01/07/17   Wallene Huh, DPM        Allergies  Allergen Reactions  . Cortisone Rash  . Effexor [Venlafaxine] Palpitations  .  Lexapro [Escitalopram] Palpitations    Heart racing    FAMILY HISTORY:  family history includes Cataracts in her brother and mother; Diabetes in her mother and sister; Hearing loss in her brother and mother; Heart disease in her mother; Hypertension in her mother; Kidney disease in her mother; Liver disease in her mother. SOCIAL HISTORY:  reports that she quit smoking about 3 years ago. Her smoking use included cigarettes. She has a 45.00 pack-year smoking history. She has never used smokeless tobacco. She reports that she does not drink alcohol or use drugs.  REVIEW OF SYSTEMS:   All negative; except for those that are bolded, which indicate positives.  Constitutional: weight loss, weight gain, night sweats, fevers, chills, fatigue, weakness.  HEENT: headaches, sore throat, sneezing, nasal congestion, post nasal drip, difficulty swallowing, tooth/dental problems, visual complaints, visual changes, ear aches. Neuro: difficulty with speech, weakness, numbness, ataxia. CV:  chest pain, orthopnea, PND, swelling in lower extremities, dizziness, palpitations, syncope.  Resp: cough, hemoptysis, dyspnea, wheezing. GI: heartburn, indigestion, abdominal pain, nausea, vomiting, diarrhea, constipation, change in bowel habits, loss of appetite, hematemesis, melena, hematochezia.  GU: dysuria, change in color of urine, urgency or frequency, flank pain, hematuria. MSK: joint pain or swelling, decreased range of motion. Psych: change in mood or affect, depression, anxiety, suicidal ideations, homicidal ideations. Skin: rash, itching, bruising.   SUBJECTIVE:   VITAL SIGNS: Temp:  [97.9 F (36.6 C)] 97.9 F (36.6 C) (04/20 2117) Pulse Rate:  [79-104] 85 (04/21 0415) Resp:  [11-25] 18 (04/21 0415) BP: (103-188)/(66-91) 124/83 (04/21 0415) SpO2:  [91 %-98 %] 94 % (04/21 0415) Weight:  [140.3 kg (309 lb 4.9 oz)] 140.3 kg (309 lb 4.9 oz) (04/21 0300)  PHYSICAL EXAMINATION: General:  Obese  Adult female, mild respiratory distress  Neuro:  Alert, oriented, follows commands  HEENT:  MMM Cardiovascular:  RRR, no MRG Lungs:  Diminished breath sounds, tachypnea  Abdomen:  Obese, active bowel sounds Musculoskeletal:  -edema  Skin:  Warm, dry, intact   LastLabs     Recent Labs  Lab 12/28/17 2126  NA 137  K 3.0*  CL 100*  CO2 22  BUN 11  CREATININE 1.07*  GLUCOSE 183*     LastLabs     Recent Labs  Lab 12/28/17 2126  HGB 12.4  HCT 36.9  WBC 6.9  PLT 161      ImagingResults(Last48hours)  Dg Chest 2 View  Result Date: 12/28/2017 CLINICAL DATA:  Increasing shortness of breath EXAM: CHEST - 2 VIEW COMPARISON:  10/06/2017 FINDINGS: Cardiac shadow is stable. Aortic calcifications are again seen. Mild increased vascular congestion is noted without interstitial edema. The lungs are well aerated bilaterally. No focal infiltrate or sizable effusion is seen. Mild degenerative changes of the thoracic spine are noted. IMPRESSION: Mild increased vascular congestion without edema. No other focal abnormality is seen. Electronically Signed   By: Inez Catalina M.D.   On: 12/28/2017 22:01   Ct Angio Chest Pe W And/or Wo Contrast  Result Date: 12/29/2017 CLINICAL DATA:  Shortness of breath and chest pain EXAM: CT ANGIOGRAPHY CHEST WITH CONTRAST TECHNIQUE: Multidetector CT imaging of the chest was performed using the standard protocol during bolus administration of intravenous contrast. Multiplanar CT image reconstructions and MIPs were obtained to  evaluate the vascular anatomy. CONTRAST:  172mL ISOVUE-370 IOPAMIDOL (ISOVUE-370) INJECTION 76% COMPARISON:  Chest radiograph 12/28/2017 FINDINGS: Cardiovascular: Contrast injection is sufficient to demonstrate satisfactory opacification of the pulmonary arteries to the segmental level.There is large volume pulmonary embolus within both distal main pulmonary arteries and extending into the lobar and segmental branches. There is  evidence of right heart strain with an RV/LV ratio of approximately 3. The main pulmonary artery is enlarged, measuring 4 cm at the bifurcation. There is a normal 3-vessel arch branching pattern without evidence of acute aortic syndrome. There is mildaortic atherosclerosis. Heart size is normal, without pericardial effusion. Mediastinum/Nodes: No mediastinal, hilar or axillary lymphadenopathy. The visualized thyroid and thoracic esophageal course are unremarkable. Lungs/Pleura: No pulmonary nodules or masses. No pleural effusion or pneumothorax. No focal airspace consolidation. No focal pleural abnormality. Upper Abdomen: Contrast bolus timing is not optimized for evaluation of the abdominal organs. Within this limitation, the visualized organs of the upper abdomen are normal. Musculoskeletal: No chest wall abnormality. No acute or significant osseous findings. Review of the MIP images confirms the above findings. IMPRESSION: 1. Positive for acute bilateral main pulmonary artery embolus with CT evidence of right heart strain (RV/LV Ratio = 3), consistent with at least submassive (intermediate risk) PE. Large volume emboli extend into the lobar and segmental arteries of both lungs. The presence of right heart strain has been associated with an increased risk of morbidity and mortality. Please activate Code PE by paging [phone]. 2.  Aortic Atherosclerosis (ICD10-I70.0). Critical Value/emergent results were called by telephone at the time of interpretation on 12/29/2017 at 3:07 am to [facility] , who verbally acknowledged these results. Electronically Signed   By: Ulyses Jarred M.D.   On: 12/29/2017 03:10     ASSESSMENT / PLAN:  Acute Hypoxic Respiratory Failure in setting of saddle pulmonary embolism with heart strain  RV/LV 3, Troponin 0.77  Plan  -Wean Supplemental Oxygen to Maintain Saturation >92 -Continue Heparin gtt  -IR consulted to evaluate patient for EKOS given RV/LF of 3 and Positive  Troponin with hypoxia  -LE Dopplers and ECHO pending   Lactic Acidosis in setting of hypoperfusion  Hypokalemia  Plan  -Trend LA  -Trend BMP  -Replace Electrolytes as indicated   H/O HTN Plan  -Cardiac Monitoring  -Maintain Systolic <098 -Hold Hydrochlorothiazide   GERD Plan  -Continue home Protonix   H/O Hypothyroidism  Plan  -Continue Synthroid   Hyperglycemia  Plan  -Send Hemoglobin A1C -Trend Glucose  -SSI   Hayden Pedro, AGACNP-BC [facility]  Pgr: [phone]  PCCM Pgr: [phone]   Attending Note:  I have examined patient, reviewed labs, studies and notes. I have discussed the case with Beaulah Corin, and I agree with the data and plans as amended above.   67 yo woman with hx treated breast CA (2017), presented with hypoxemia, dyspnea. Found to have large bilateral PE with high clot burden, RV/LV ratio 3. She is hypoxemic on 5L/min [place]. Echocardiogram is being performed now. Troponin positive at 0.77.         Vitals:   12/29/17 0645 12/29/17 0700 12/29/17 0730 12/29/17 0745  BP: 121/83 139/84  133/78  Pulse: 84 87 87 83  Resp: 18 18 (!) 23 19  Temp:      TempSrc:      SpO2: 93% 94% 92% 94%  Weight:      Height:       On eval she is  A pleasant obese woman,  NAD, comfortable resp pattern on 5L/min but gets dyspneic when speaking or moving. Distant BS without any wheeze. Heart regular, no tachycardia, no M. Obese abdomen, + BS. No LE edema.   Based on her clot burden, RV/LV ratio, troponin I believe that she is an appropriate candidate for EKOS to optimize future RV fxn, CO, resp function. She is speaking with IR about the procedure now. If she agrees then we will admit to ICU to complete the treatment. She is on heparin gtt currently. Will initiate discussions regarding her outpatient anticoagulation choices after EKOS completed.   Independent critical care time is 40 minutes.   Baltazar Apo, MD,  PhD 12/29/2017, 8:22 AM [facility] [phone] or if no answer [phone]

## 2017-12-30 NOTE — Progress Notes (Signed)
[facility] ADULT ICU REPLACEMENT PROTOCOL FOR AM LAB REPLACEMENT ONLY  The patient does apply for the [facility] Adult ICU Electrolyte Replacment Protocol based on the criteria listed below:   1. Is GFR >/= 40 ml/min? Yes.    Patient's GFR today is >60 2. Is urine output >/= 0.5 ml/kg/hr for the last 6 hours? Yes.   Patient's UOP is 0.5 ml/kg/hr 3. Is BUN < 60 mg/dL? Yes.    Patient's BUN today is 6 4. Abnormal electrolyte(s): K 2.9 5. Ordered repletion with: protocol 6. If a panic level lab has been reported, has the CCM MD in charge been notified? No..   Physician:    Ronda Fairly A 12/30/2017 5:36 AM

## 2017-12-30 NOTE — Progress Notes (Signed)
PULMONARY / CRITICAL CARE MEDICINE   Name: Sheila Jacobs MRN: [id number] DOB: 14-Sep-1950    ADMISSION DATE:  12/28/2017 CONSULTATION DATE:  12/29/17  REFERRING MD:  Dr. Thomasene Lot  CHIEF COMPLAINT:  Dyspnea  HISTORY OF PRESENT ILLNESS:   Sheila Jacobs is a 67 y.o f with breast cancer s/p lumpectomy/radiation in 2017, thyroid goiter s/p thyroidectomy, hypothyroidism, hypertension, gerd who presented to ED with dyspnea that has been present for past few weeks and has been worsening.   Patient has not had any recent trauma, recent travel, surgeries, and pain or swelling to lower extremity. Last oncology appointment was March 2019 when she was cancer free. She claims to be sedentary.  Patient was found to have o2 sat of 70% with increased wob. CTA showed saddle embolism with RV/LV=3 and troponin 0.77. Patient has a PESI score of 119/class IV which places patient at 4-11% 30 day mortality risk.  PAST MEDICAL HISTORY :  She  has a past medical history of Anginal pain ([place]), Anxiety, Arthritis, Breast cancer ([place]), Breast cancer of upper-outer quadrant of left female breast ([place]) (01/31/2016), Colon polyps, Enlarged LA (left atrium), GERD (gastroesophageal reflux disease), Hard of hearing, Headache, Heavy cigarette smoker, History of radiation therapy (06/18/16- 07/30/16), Hyperlipidemia, Hypertension, Morbid obesity ([place]), Numbness and tingling, Obesity, Thyroid goiter, and Vertigo.  PAST SURGICAL HISTORY: She  has a past surgical history that includes Tubal ligation; left heart catheterization with coronary angiogram (N/A, 10/21/2014); Colonoscopy with propofol (N/A, 10/20/2015); Esophagogastroduodenoscopy (egd) with propofol (N/A, 10/20/2015); Breast lumpectomy with radioactive seed and sentinel lymph node biopsy (Left, 04/11/2016); Thyroidectomy (N/A, 02/26/2017); Breast lumpectomy (Left, 2017); IR Angiogram Pulmonary Bilateral Selective (12/29/2017); IR INFUSION THROMBOL ARTERIAL INITIAL (MS) (12/29/2017); IR  US Guide Vasc Access Right (12/29/2017); IR INFUSION THROMBOL ARTERIAL INITIAL (MS) (12/29/2017); IR Angiogram Selective Each Additional Vessel (12/29/2017); and IR Angiogram Selective Each Additional Vessel (12/29/2017).  Allergies  Allergen Reactions  . Cortisone Rash  . Effexor [Venlafaxine] Palpitations  . Lexapro [Escitalopram] Palpitations    Heart racing    No current facility-administered medications on file prior to encounter.    Current Outpatient Medications on File Prior to Encounter  Medication Sig  . ALPRAZolam (XANAX) 0.25 MG tablet TAKE ONE TABLET BY MOUTH TWICE DAILY AS NEEDED FOR ANXIETY  . anastrozole (ARIMIDEX) 1 MG tablet Take 1 tablet (1 mg total) by mouth daily.  Marland Kitchen aspirin EC 81 MG tablet Take 81 mg by mouth daily.  . hydrochlorothiazide (HYDRODIURIL) 25 MG tablet Take 25 mg by mouth daily.  Marland Kitchen levothyroxine (SYNTHROID, LEVOTHROID) 150 MCG tablet Take 1 tablet (150 mcg total) by mouth daily.  . pantoprazole (PROTONIX) 40 MG tablet TAKE ONE TABLET BY MOUTH ONCE DAILY  . ketoconazole (NIZORAL) 2 % cream APPLY TOPICALLY DAILY AS NEEDED FOR IRRITATION (Patient not taking: Reported on 12/28/2017)    FAMILY HISTORY:  Her indicated that her mother is deceased. She indicated that her father is deceased. She indicated that her sister is deceased. She indicated that her brother is alive. She indicated that the status of her neg hx is unknown.   SOCIAL HISTORY: She  reports that she quit smoking about 3 years ago. Her smoking use included cigarettes. She has a 45.00 pack-year smoking history. She has never used smokeless tobacco. She reports that she does not drink alcohol or use drugs.  SUBJECTIVE:  Sheila Jacobs was seen sleeping in her bed this morning. She states that she is doing well and feels that her breathing has improved.  VITAL SIGNS: BP (!) 92/56   Pulse 66   Temp 98.5 F (36.9 C) (Oral)   Resp (!) 23   Ht 5\' 3"  (1.6 m)   Wt 295 lb 6.7 oz (134 kg)   SpO2 97%    BMI 52.33 kg/m   INTAKE / OUTPUT: I/O last 3 completed shifts: In: 1221.6 [P.O.:400; I.V.:821.6] Out: 300 [Urine:300]  PHYSICAL EXAMINATION: Physical Exam  Constitutional: She appears well-developed and well-nourished. No distress.  On 3L [place]  HENT:  Head: Normocephalic and atraumatic.  Eyes: Conjunctivae are normal.  Cardiovascular: Normal rate, regular rhythm and normal heart sounds.  Respiratory: Effort normal and breath sounds normal. No respiratory distress. She has no wheezes.  GI: Soft. Bowel sounds are normal. She exhibits no distension. There is no tenderness.  Musculoskeletal: She exhibits no edema.  Neurological:  Able to follow commands although lethargic  Skin: She is not diaphoretic. There is erythema.  Right antecubital area with erythema and swelling at site of periph IV     LABS:  BMET Recent Labs  Lab 12/28/17 2126 12/29/17 0442 12/30/17 0317  NA 137 139 137  K 3.0* 3.6 2.9*  CL 100* 102 107  CO2 22 26 23   BUN 11 11 6   CREATININE 1.07* 0.90 0.72  GLUCOSE 183* 112* 104*    Electrolytes Recent Labs  Lab 12/28/17 2126 12/29/17 0442 12/30/17 0317  CALCIUM 9.4 9.2 8.1*  MG  --  2.0 1.8  PHOS  --  2.2* 2.6    CBC Recent Labs  Lab 12/28/17 2126 12/29/17 1715 12/30/17 0317  WBC 6.9 6.1 5.5  HGB 12.4 11.4* 10.1*  HCT 36.9 34.3* 30.5*  PLT 161 124* 101*    Coag's Recent Labs  Lab 12/29/17 0937  INR 1.20    Sepsis Markers Recent Labs  Lab 12/28/17 2139 12/28/17 2330 12/29/17 1715  LATICACIDVEN 3.56* 2.56* 1.9    ABG No results for input(s): PHART, PCO2ART, PO2ART in the last 168 hours.  Liver Enzymes Recent Labs  Lab 12/28/17 2126  AST 33  ALT 11*  ALKPHOS 63  BILITOT 1.2  ALBUMIN 3.6    Cardiac Enzymes No results for input(s): TROPONINI, PROBNP in the last 168 hours.  Glucose Recent Labs  Lab 12/29/17 0741 12/29/17 1812 12/29/17 2036 12/29/17 2343 12/30/17 0347  GLUCAP 89 89 111* 86 88     Imaging Ir Angiogram Pulmonary Bilateral Selective  Result Date: 12/29/2017 INDICATION: 67 year old female with acute large volume bilateral pulmonary emboli and associated cor pulmonale (submassive/intermediate risk pulmonary embolus). She presents for catheter directed bilateral pulmonary arterial thrombolysis. EXAM: BILATERAL PULMONARY ARTERIOGRAPHY; IR INFUSION THROMBOL ARTERIAL INITIAL (MS); IR ULTRASOUND GUIDANCE VASC ACCESS RIGHT; ADDITIONAL ARTERIOGRAPHY 1. Ultrasound-guided access right common femoral vein 2. Second ultrasound-guided access right common femoral vein 3. Catheterization of the main pulmonary artery with main pulmonary arteriogram and pressure measurements 4. Catheterization of the left pulmonary artery with arteriogram 5. Placement of a left lower lobe pulmonary artery multi sidehole ultrasound assisted infusion catheter 6. Catheterization of the right pulmonary artery with arteriogram 7. Placement of a right lower lobe pulmonary artery multi sidehole ultrasound assisted infusion catheter 8. Initiation of bilateral pulmonary arterial thrombolysis COMPARISON:  CT PE study 12/29/2017 MEDICATIONS: None. ANESTHESIA/SEDATION: Versed 1 mg IV; Fentanyl 50 mcg IV Moderate Sedation Time:  39 minutes The patient was continuously monitored during the procedure by the interventional radiology nurse under my direct supervision. FLUOROSCOPY TIME:  Fluoroscopy Time: 11 minutes 0 seconds (122 mGy). COMPLICATIONS: None immediate.  TECHNIQUE: Informed written consent was obtained from the patient after a thorough discussion of the procedural risks, benefits and alternatives. All questions were addressed. Maximal Sterile Barrier Technique was utilized including caps, mask, sterile gowns, sterile gloves, sterile drape, hand hygiene and skin antiseptic. A timeout was performed prior to the initiation of the procedure. The right was interrogated with ultrasound and found to be widely patent. An image was  obtained and stored for the medical record. Local anesthesia was attained by infiltration with 1% lidocaine. A small dermatotomy was made. Under real-time sonographic guidance, the vessel was punctured with a 21 gauge micropuncture needle. Using standard technique, the initial micro needle was exchanged over a 0.018 micro wire for a transitional 4 [place] micro sheath. The micro sheath was then exchanged over a 0.035 wire for a 6 French vascular sheath. Using the same technique, a second 6 [place] vascular sheath was also placed into the common femoral vein just lateral to the first. Working through the more medial sheath, a C2 cobra catheter was advanced over a Bentson wire in used to select the main pulmonary artery. A main pulmonary arteriogram was performed. There is massive enlargement of the pulmonary trunk and central left and right pulmonary arteries with very poor opacification of the lobar pulmonary arteries bilaterally secondary to acute thrombus. The pulmonary arterial pressure was obtained at 53/25 (35) mm Hg. This is consistent with pulmonary arterial hypertension. The C2 cobra catheter was then navigated into the left main pulmonary artery. A left pulmonary arteriogram was performed. Occlusive thrombus is present throughout the left lower and upper lobe pulmonary arteries. Using a glidewire, the catheter was successfully navigated into the distal aspect of the left lower lobe pulmonary artery. The glidewire was then exchanged for a Rosen wire. The 5 French catheter was removed and a 12 cm infusion length EKOS multi sidehole ultrasound assisted infusion catheter was advanced over the wire and positioned in the left lower lobe pulmonary artery. The C2 cobra catheter was then advanced through the more lateral sheath and navigated into the right main pulmonary artery. A right main pulmonary arteriogram was performed again demonstrating occlusive thrombus within the right upper, middle and lower lobe  pulmonary arteries. The catheter was successfully navigated over a Glidewire into the right lower lobe pulmonary artery. The glidewire was then exchanged for a rose in wire. The 5 French catheter was removed and a second 12 cm infusion length EKOS multi sidehole ultrasound assisted infusion catheter was advanced over the wire and positioned in the right lower lobe pulmonary artery. The infusion catheters were connected to tPA and bilateral pulmonary artery arterial thrombolysis was initiated at a rate of 1 milligram/hour per catheter for a total of 2 milligrams/hour. The sheaths were secured with 0 Prolene suture. The patient tolerated the procedure well. FINDINGS: Main PAP: 53/25 (35) mm Hg IMPRESSION: 1. Suspect acute on chronic pulmonary artery hypertension. The pulmonary trunk and main pulmonary arteries are massive suggesting underlying chronic pulmonary arterial hypertension. 2. Occlusive or near occlusive thrombus present within the lobar arteries of all lobes of both lungs. 3. Successful initiation of bilateral pulmonary artery thrombolysis using ultrasound assisted multi side-hole infusion catheters (EKOS). Signed, Criselda Peaches, MD [facility] Radiology Electronically Signed   By: Jacqulynn Cadet M.D.   On: 12/29/2017 11:37   Ir Angiogram Selective Each Additional Vessel  Result Date: 12/29/2017 INDICATION: 67 year old female with acute large volume bilateral pulmonary emboli and associated cor pulmonale (submassive/intermediate risk pulmonary embolus).  She presents for catheter directed bilateral pulmonary arterial thrombolysis. EXAM: BILATERAL PULMONARY ARTERIOGRAPHY; IR INFUSION THROMBOL ARTERIAL INITIAL (MS); IR ULTRASOUND GUIDANCE VASC ACCESS RIGHT; ADDITIONAL ARTERIOGRAPHY 1. Ultrasound-guided access right common femoral vein 2. Second ultrasound-guided access right common femoral vein 3. Catheterization of the main pulmonary artery with  main pulmonary arteriogram and pressure measurements 4. Catheterization of the left pulmonary artery with arteriogram 5. Placement of a left lower lobe pulmonary artery multi sidehole ultrasound assisted infusion catheter 6. Catheterization of the right pulmonary artery with arteriogram 7. Placement of a right lower lobe pulmonary artery multi sidehole ultrasound assisted infusion catheter 8. Initiation of bilateral pulmonary arterial thrombolysis COMPARISON:  CT PE study 12/29/2017 MEDICATIONS: None. ANESTHESIA/SEDATION: Versed 1 mg IV; Fentanyl 50 mcg IV Moderate Sedation Time:  39 minutes The patient was continuously monitored during the procedure by the interventional radiology nurse under my direct supervision. FLUOROSCOPY TIME:  Fluoroscopy Time: 11 minutes 0 seconds (122 mGy). COMPLICATIONS: None immediate. TECHNIQUE: Informed written consent was obtained from the patient after a thorough discussion of the procedural risks, benefits and alternatives. All questions were addressed. Maximal Sterile Barrier Technique was utilized including caps, mask, sterile gowns, sterile gloves, sterile drape, hand hygiene and skin antiseptic. A timeout was performed prior to the initiation of the procedure. The right was interrogated with ultrasound and found to be widely patent. An image was obtained and stored for the medical record. Local anesthesia was attained by infiltration with 1% lidocaine. A small dermatotomy was made. Under real-time sonographic guidance, the vessel was punctured with a 21 gauge micropuncture needle. Using standard technique, the initial micro needle was exchanged over a 0.018 micro wire for a transitional 4 [place] micro sheath. The micro sheath was then exchanged over a 0.035 wire for a 6 French vascular sheath. Using the same technique, a second 6 [place] vascular sheath was also placed into the common femoral vein just lateral to the first. Working through the more medial sheath, a C2 cobra  catheter was advanced over a Bentson wire in used to select the main pulmonary artery. A main pulmonary arteriogram was performed. There is massive enlargement of the pulmonary trunk and central left and right pulmonary arteries with very poor opacification of the lobar pulmonary arteries bilaterally secondary to acute thrombus. The pulmonary arterial pressure was obtained at 53/25 (35) mm Hg. This is consistent with pulmonary arterial hypertension. The C2 cobra catheter was then navigated into the left main pulmonary artery. A left pulmonary arteriogram was performed. Occlusive thrombus is present throughout the left lower and upper lobe pulmonary arteries. Using a glidewire, the catheter was successfully navigated into the distal aspect of the left lower lobe pulmonary artery. The glidewire was then exchanged for a Rosen wire. The 5 French catheter was removed and a 12 cm infusion length EKOS multi sidehole ultrasound assisted infusion catheter was advanced over the wire and positioned in the left lower lobe pulmonary artery. The C2 cobra catheter was then advanced through the more lateral sheath and navigated into the right main pulmonary artery. A right main pulmonary arteriogram was performed again demonstrating occlusive thrombus within the right upper, middle and lower lobe pulmonary arteries. The catheter was successfully navigated over a Glidewire into the right lower lobe pulmonary artery. The glidewire was then exchanged for a rose in wire. The 5 French catheter was removed and a second 12 cm infusion length EKOS multi sidehole ultrasound assisted infusion catheter was advanced over the wire and positioned in the right lower lobe  pulmonary artery. The infusion catheters were connected to tPA and bilateral pulmonary artery arterial thrombolysis was initiated at a rate of 1 milligram/hour per catheter for a total of 2 milligrams/hour. The sheaths were secured with 0 Prolene suture. The patient tolerated the  procedure well. FINDINGS: Main PAP: 53/25 (35) mm Hg IMPRESSION: 1. Suspect acute on chronic pulmonary artery hypertension. The pulmonary trunk and main pulmonary arteries are massive suggesting underlying chronic pulmonary arterial hypertension. 2. Occlusive or near occlusive thrombus present within the lobar arteries of all lobes of both lungs. 3. Successful initiation of bilateral pulmonary artery thrombolysis using ultrasound assisted multi side-hole infusion catheters (EKOS). Signed, Criselda Peaches, MD [facility] Radiology Electronically Signed   By: Jacqulynn Cadet M.D.   On: 12/29/2017 11:37   Ir Angiogram Selective Each Additional Vessel  Result Date: 12/29/2017 INDICATION: 67 year old female with acute large volume bilateral pulmonary emboli and associated cor pulmonale (submassive/intermediate risk pulmonary embolus). She presents for catheter directed bilateral pulmonary arterial thrombolysis. EXAM: BILATERAL PULMONARY ARTERIOGRAPHY; IR INFUSION THROMBOL ARTERIAL INITIAL (MS); IR ULTRASOUND GUIDANCE VASC ACCESS RIGHT; ADDITIONAL ARTERIOGRAPHY 1. Ultrasound-guided access right common femoral vein 2. Second ultrasound-guided access right common femoral vein 3. Catheterization of the main pulmonary artery with main pulmonary arteriogram and pressure measurements 4. Catheterization of the left pulmonary artery with arteriogram 5. Placement of a left lower lobe pulmonary artery multi sidehole ultrasound assisted infusion catheter 6. Catheterization of the right pulmonary artery with arteriogram 7. Placement of a right lower lobe pulmonary artery multi sidehole ultrasound assisted infusion catheter 8. Initiation of bilateral pulmonary arterial thrombolysis COMPARISON:  CT PE study 12/29/2017 MEDICATIONS: None. ANESTHESIA/SEDATION: Versed 1 mg IV; Fentanyl 50 mcg IV Moderate Sedation Time:  39 minutes The patient was continuously monitored during the  procedure by the interventional radiology nurse under my direct supervision. FLUOROSCOPY TIME:  Fluoroscopy Time: 11 minutes 0 seconds (122 mGy). COMPLICATIONS: None immediate. TECHNIQUE: Informed written consent was obtained from the patient after a thorough discussion of the procedural risks, benefits and alternatives. All questions were addressed. Maximal Sterile Barrier Technique was utilized including caps, mask, sterile gowns, sterile gloves, sterile drape, hand hygiene and skin antiseptic. A timeout was performed prior to the initiation of the procedure. The right was interrogated with ultrasound and found to be widely patent. An image was obtained and stored for the medical record. Local anesthesia was attained by infiltration with 1% lidocaine. A small dermatotomy was made. Under real-time sonographic guidance, the vessel was punctured with a 21 gauge micropuncture needle. Using standard technique, the initial micro needle was exchanged over a 0.018 micro wire for a transitional 4 [place] micro sheath. The micro sheath was then exchanged over a 0.035 wire for a 6 French vascular sheath. Using the same technique, a second 6 [place] vascular sheath was also placed into the common femoral vein just lateral to the first. Working through the more medial sheath, a C2 cobra catheter was advanced over a Bentson wire in used to select the main pulmonary artery. A main pulmonary arteriogram was performed. There is massive enlargement of the pulmonary trunk and central left and right pulmonary arteries with very poor opacification of the lobar pulmonary arteries bilaterally secondary to acute thrombus. The pulmonary arterial pressure was obtained at 53/25 (35) mm Hg. This is consistent with pulmonary arterial hypertension. The C2 cobra catheter was then navigated into the left main pulmonary artery. A left pulmonary arteriogram was performed. Occlusive thrombus is present throughout the left  lower and upper lobe  pulmonary arteries. Using a glidewire, the catheter was successfully navigated into the distal aspect of the left lower lobe pulmonary artery. The glidewire was then exchanged for a Rosen wire. The 5 French catheter was removed and a 12 cm infusion length EKOS multi sidehole ultrasound assisted infusion catheter was advanced over the wire and positioned in the left lower lobe pulmonary artery. The C2 cobra catheter was then advanced through the more lateral sheath and navigated into the right main pulmonary artery. A right main pulmonary arteriogram was performed again demonstrating occlusive thrombus within the right upper, middle and lower lobe pulmonary arteries. The catheter was successfully navigated over a Glidewire into the right lower lobe pulmonary artery. The glidewire was then exchanged for a rose in wire. The 5 French catheter was removed and a second 12 cm infusion length EKOS multi sidehole ultrasound assisted infusion catheter was advanced over the wire and positioned in the right lower lobe pulmonary artery. The infusion catheters were connected to tPA and bilateral pulmonary artery arterial thrombolysis was initiated at a rate of 1 milligram/hour per catheter for a total of 2 milligrams/hour. The sheaths were secured with 0 Prolene suture. The patient tolerated the procedure well. FINDINGS: Main PAP: 53/25 (35) mm Hg IMPRESSION: 1. Suspect acute on chronic pulmonary artery hypertension. The pulmonary trunk and main pulmonary arteries are massive suggesting underlying chronic pulmonary arterial hypertension. 2. Occlusive or near occlusive thrombus present within the lobar arteries of all lobes of both lungs. 3. Successful initiation of bilateral pulmonary artery thrombolysis using ultrasound assisted multi side-hole infusion catheters (EKOS). Signed, Criselda Peaches, MD [facility] Radiology Electronically Signed   By: Jacqulynn Cadet M.D.   On:  12/29/2017 11:37   Ir US Guide Vasc Access Right  Result Date: 12/29/2017 INDICATION: 67 year old female with acute large volume bilateral pulmonary emboli and associated cor pulmonale (submassive/intermediate risk pulmonary embolus). She presents for catheter directed bilateral pulmonary arterial thrombolysis. EXAM: BILATERAL PULMONARY ARTERIOGRAPHY; IR INFUSION THROMBOL ARTERIAL INITIAL (MS); IR ULTRASOUND GUIDANCE VASC ACCESS RIGHT; ADDITIONAL ARTERIOGRAPHY 1. Ultrasound-guided access right common femoral vein 2. Second ultrasound-guided access right common femoral vein 3. Catheterization of the main pulmonary artery with main pulmonary arteriogram and pressure measurements 4. Catheterization of the left pulmonary artery with arteriogram 5. Placement of a left lower lobe pulmonary artery multi sidehole ultrasound assisted infusion catheter 6. Catheterization of the right pulmonary artery with arteriogram 7. Placement of a right lower lobe pulmonary artery multi sidehole ultrasound assisted infusion catheter 8. Initiation of bilateral pulmonary arterial thrombolysis COMPARISON:  CT PE study 12/29/2017 MEDICATIONS: None. ANESTHESIA/SEDATION: Versed 1 mg IV; Fentanyl 50 mcg IV Moderate Sedation Time:  39 minutes The patient was continuously monitored during the procedure by the interventional radiology nurse under my direct supervision. FLUOROSCOPY TIME:  Fluoroscopy Time: 11 minutes 0 seconds (122 mGy). COMPLICATIONS: None immediate. TECHNIQUE: Informed written consent was obtained from the patient after a thorough discussion of the procedural risks, benefits and alternatives. All questions were addressed. Maximal Sterile Barrier Technique was utilized including caps, mask, sterile gowns, sterile gloves, sterile drape, hand hygiene and skin antiseptic. A timeout was performed prior to the initiation of the procedure. The right was interrogated with ultrasound and found to be widely patent. An image was obtained  and stored for the medical record. Local anesthesia was attained by infiltration with 1% lidocaine. A small dermatotomy was made. Under real-time sonographic guidance, the vessel was punctured with a 21  gauge micropuncture needle. Using standard technique, the initial micro needle was exchanged over a 0.018 micro wire for a transitional 4 [place] micro sheath. The micro sheath was then exchanged over a 0.035 wire for a 6 French vascular sheath. Using the same technique, a second 6 [place] vascular sheath was also placed into the common femoral vein just lateral to the first. Working through the more medial sheath, a C2 cobra catheter was advanced over a Bentson wire in used to select the main pulmonary artery. A main pulmonary arteriogram was performed. There is massive enlargement of the pulmonary trunk and central left and right pulmonary arteries with very poor opacification of the lobar pulmonary arteries bilaterally secondary to acute thrombus. The pulmonary arterial pressure was obtained at 53/25 (35) mm Hg. This is consistent with pulmonary arterial hypertension. The C2 cobra catheter was then navigated into the left main pulmonary artery. A left pulmonary arteriogram was performed. Occlusive thrombus is present throughout the left lower and upper lobe pulmonary arteries. Using a glidewire, the catheter was successfully navigated into the distal aspect of the left lower lobe pulmonary artery. The glidewire was then exchanged for a Rosen wire. The 5 French catheter was removed and a 12 cm infusion length EKOS multi sidehole ultrasound assisted infusion catheter was advanced over the wire and positioned in the left lower lobe pulmonary artery. The C2 cobra catheter was then advanced through the more lateral sheath and navigated into the right main pulmonary artery. A right main pulmonary arteriogram was performed again demonstrating occlusive thrombus within the right upper, middle and lower lobe pulmonary  arteries. The catheter was successfully navigated over a Glidewire into the right lower lobe pulmonary artery. The glidewire was then exchanged for a rose in wire. The 5 French catheter was removed and a second 12 cm infusion length EKOS multi sidehole ultrasound assisted infusion catheter was advanced over the wire and positioned in the right lower lobe pulmonary artery. The infusion catheters were connected to tPA and bilateral pulmonary artery arterial thrombolysis was initiated at a rate of 1 milligram/hour per catheter for a total of 2 milligrams/hour. The sheaths were secured with 0 Prolene suture. The patient tolerated the procedure well. FINDINGS: Main PAP: 53/25 (35) mm Hg IMPRESSION: 1. Suspect acute on chronic pulmonary artery hypertension. The pulmonary trunk and main pulmonary arteries are massive suggesting underlying chronic pulmonary arterial hypertension. 2. Occlusive or near occlusive thrombus present within the lobar arteries of all lobes of both lungs. 3. Successful initiation of bilateral pulmonary artery thrombolysis using ultrasound assisted multi side-hole infusion catheters (EKOS). Signed, Criselda Peaches, MD [facility] Radiology Electronically Signed   By: Jacqulynn Cadet M.D.   On: 12/29/2017 11:37   Ir Infusion Thrombol Arterial Initial (ms)  Result Date: 12/29/2017 INDICATION: 67 year old female with acute large volume bilateral pulmonary emboli and associated cor pulmonale (submassive/intermediate risk pulmonary embolus). She presents for catheter directed bilateral pulmonary arterial thrombolysis. EXAM: BILATERAL PULMONARY ARTERIOGRAPHY; IR INFUSION THROMBOL ARTERIAL INITIAL (MS); IR ULTRASOUND GUIDANCE VASC ACCESS RIGHT; ADDITIONAL ARTERIOGRAPHY 1. Ultrasound-guided access right common femoral vein 2. Second ultrasound-guided access right common femoral vein 3. Catheterization of the main pulmonary artery with main pulmonary  arteriogram and pressure measurements 4. Catheterization of the left pulmonary artery with arteriogram 5. Placement of a left lower lobe pulmonary artery multi sidehole ultrasound assisted infusion catheter 6. Catheterization of the right pulmonary artery with arteriogram 7. Placement of a right lower lobe pulmonary artery multi sidehole ultrasound assisted  infusion catheter 8. Initiation of bilateral pulmonary arterial thrombolysis COMPARISON:  CT PE study 12/29/2017 MEDICATIONS: None. ANESTHESIA/SEDATION: Versed 1 mg IV; Fentanyl 50 mcg IV Moderate Sedation Time:  39 minutes The patient was continuously monitored during the procedure by the interventional radiology nurse under my direct supervision. FLUOROSCOPY TIME:  Fluoroscopy Time: 11 minutes 0 seconds (122 mGy). COMPLICATIONS: None immediate. TECHNIQUE: Informed written consent was obtained from the patient after a thorough discussion of the procedural risks, benefits and alternatives. All questions were addressed. Maximal Sterile Barrier Technique was utilized including caps, mask, sterile gowns, sterile gloves, sterile drape, hand hygiene and skin antiseptic. A timeout was performed prior to the initiation of the procedure. The right was interrogated with ultrasound and found to be widely patent. An image was obtained and stored for the medical record. Local anesthesia was attained by infiltration with 1% lidocaine. A small dermatotomy was made. Under real-time sonographic guidance, the vessel was punctured with a 21 gauge micropuncture needle. Using standard technique, the initial micro needle was exchanged over a 0.018 micro wire for a transitional 4 [place] micro sheath. The micro sheath was then exchanged over a 0.035 wire for a 6 French vascular sheath. Using the same technique, a second 6 [place] vascular sheath was also placed into the common femoral vein just lateral to the first. Working through the more medial sheath, a C2 cobra catheter was  advanced over a Bentson wire in used to select the main pulmonary artery. A main pulmonary arteriogram was performed. There is massive enlargement of the pulmonary trunk and central left and right pulmonary arteries with very poor opacification of the lobar pulmonary arteries bilaterally secondary to acute thrombus. The pulmonary arterial pressure was obtained at 53/25 (35) mm Hg. This is consistent with pulmonary arterial hypertension. The C2 cobra catheter was then navigated into the left main pulmonary artery. A left pulmonary arteriogram was performed. Occlusive thrombus is present throughout the left lower and upper lobe pulmonary arteries. Using a glidewire, the catheter was successfully navigated into the distal aspect of the left lower lobe pulmonary artery. The glidewire was then exchanged for a Rosen wire. The 5 French catheter was removed and a 12 cm infusion length EKOS multi sidehole ultrasound assisted infusion catheter was advanced over the wire and positioned in the left lower lobe pulmonary artery. The C2 cobra catheter was then advanced through the more lateral sheath and navigated into the right main pulmonary artery. A right main pulmonary arteriogram was performed again demonstrating occlusive thrombus within the right upper, middle and lower lobe pulmonary arteries. The catheter was successfully navigated over a Glidewire into the right lower lobe pulmonary artery. The glidewire was then exchanged for a rose in wire. The 5 French catheter was removed and a second 12 cm infusion length EKOS multi sidehole ultrasound assisted infusion catheter was advanced over the wire and positioned in the right lower lobe pulmonary artery. The infusion catheters were connected to tPA and bilateral pulmonary artery arterial thrombolysis was initiated at a rate of 1 milligram/hour per catheter for a total of 2 milligrams/hour. The sheaths were secured with 0 Prolene suture. The patient tolerated the procedure  well. FINDINGS: Main PAP: 53/25 (35) mm Hg IMPRESSION: 1. Suspect acute on chronic pulmonary artery hypertension. The pulmonary trunk and main pulmonary arteries are massive suggesting underlying chronic pulmonary arterial hypertension. 2. Occlusive or near occlusive thrombus present within the lobar arteries of all lobes of both lungs. 3. Successful initiation of bilateral pulmonary artery thrombolysis using  ultrasound assisted multi side-hole infusion catheters (EKOS). Signed, Criselda Peaches, MD [facility] Inter[facility] Radiology Special[facility] Radiology Electronically Signed   By: Jacqulynn Cadet M.D.   On: 12/29/2017 11:37   Ir Infusion Thrombol Arterial Initial (ms)  Result Date: 12/29/2017 INDICATION: 67 year old female with acute large volume bilateral pulmonary emboli and associated cor pulmonale (submassive/intermediate risk pulmonary embolus). She presents for catheter directed bilateral pulmonary arterial thrombolysis. EXAM: BILATERAL PULMONARY ARTERIOGRAPHY; IR INFUSION THROMBOL ARTERIAL INITIAL (MS); IR ULTRASOUND GUIDANCE VASC ACCESS RIGHT; ADDITIONAL ARTERIOGRAPHY 1. Ultrasound-guided access right common femoral vein 2. Second ultrasound-guided access right common femoral vein 3. Catheterization of the main pulmonary artery with main pulmonary arteriogram and pressure measurements 4. Catheterization of the left pulmonary artery with arteriogram 5. Placement of a left lower lobe pulmonary artery multi sidehole ultrasound assisted infusion catheter 6. Catheterization of the right pulmonary artery with arteriogram 7. Placement of a right lower lobe pulmonary artery multi sidehole ultrasound assisted infusion catheter 8. Initiation of bilateral pulmonary arterial thrombolysis COMPARISON:  CT PE study 12/29/2017 MEDICATIONS: None. ANESTHESIA/SEDATION: Versed 1 mg IV; Fentanyl 50 mcg IV Moderate Sedation Time:  39 minutes The patient was continuously monitored during the procedure by  the interventional radiology nurse under my direct supervision. FLUOROSCOPY TIME:  Fluoroscopy Time: 11 minutes 0 seconds (122 mGy). COMPLICATIONS: None immediate. TECHNIQUE: Informed written consent was obtained from the patient after a thorough discussion of the procedural risks, benefits and alternatives. All questions were addressed. Maximal Sterile Barrier Technique was utilized including caps, mask, sterile gowns, sterile gloves, sterile drape, hand hygiene and skin antiseptic. A timeout was performed prior to the initiation of the procedure. The right was interrogated with ultrasound and found to be widely patent. An image was obtained and stored for the medical record. Local anesthesia was attained by infiltration with 1% lidocaine. A small dermatotomy was made. Under real-time sonographic guidance, the vessel was punctured with a 21 gauge micropuncture needle. Using standard technique, the initial micro needle was exchanged over a 0.018 micro wire for a transitional 4 [place] micro sheath. The micro sheath was then exchanged over a 0.035 wire for a 6 French vascular sheath. Using the same technique, a second 6 [place] vascular sheath was also placed into the common femoral vein just lateral to the first. Working through the more medial sheath, a C2 cobra catheter was advanced over a Bentson wire in used to select the main pulmonary artery. A main pulmonary arteriogram was performed. There is massive enlargement of the pulmonary trunk and central left and right pulmonary arteries with very poor opacification of the lobar pulmonary arteries bilaterally secondary to acute thrombus. The pulmonary arterial pressure was obtained at 53/25 (35) mm Hg. This is consistent with pulmonary arterial hypertension. The C2 cobra catheter was then navigated into the left main pulmonary artery. A left pulmonary arteriogram was performed. Occlusive thrombus is present throughout the left lower and upper lobe pulmonary arteries.  Using a glidewire, the catheter was successfully navigated into the distal aspect of the left lower lobe pulmonary artery. The glidewire was then exchanged for a Rosen wire. The 5 French catheter was removed and a 12 cm infusion length EKOS multi sidehole ultrasound assisted infusion catheter was advanced over the wire and positioned in the left lower lobe pulmonary artery. The C2 cobra catheter was then advanced through the more lateral sheath and navigated into the right main pulmonary artery. A right main pulmonary arteriogram was performed again demonstrating occlusive thrombus within the right upper,  middle and lower lobe pulmonary arteries. The catheter was successfully navigated over a Glidewire into the right lower lobe pulmonary artery. The glidewire was then exchanged for a rose in wire. The 5 French catheter was removed and a second 12 cm infusion length EKOS multi sidehole ultrasound assisted infusion catheter was advanced over the wire and positioned in the right lower lobe pulmonary artery. The infusion catheters were connected to tPA and bilateral pulmonary artery arterial thrombolysis was initiated at a rate of 1 milligram/hour per catheter for a total of 2 milligrams/hour. The sheaths were secured with 0 Prolene suture. The patient tolerated the procedure well. FINDINGS: Main PAP: 53/25 (35) mm Hg IMPRESSION: 1. Suspect acute on chronic pulmonary artery hypertension. The pulmonary trunk and main pulmonary arteries are massive suggesting underlying chronic pulmonary arterial hypertension. 2. Occlusive or near occlusive thrombus present within the lobar arteries of all lobes of both lungs. 3. Successful initiation of bilateral pulmonary artery thrombolysis using ultrasound assisted multi side-hole infusion catheters (EKOS). Signed, Criselda Peaches, MD [facility] Inter[facility]iology [facility] Radiology Electronically Signed   By: Jacqulynn Cadet M.D.   On: 12/29/2017 11:37     CULTURES: none  ANTIBIOTICS: none  SIGNIFICANT EVENTS:   LINES/TUBES: Right upper forearm peripheral iv  DISCUSSION: 67 y.o f with  Presented with hypoxemia and dyspnea. CTA showed saddle embolism with RV/LV=3. Pesi score of 119.  Placed on  ASSESSMENT / PLAN:  PULMONARY A: Acute hypoxic respiratory failure secondary to saddle pulmonary embolism s/p bilateral PA thrombolysis with EKOS and TPA  P:   Wean supplemental oxygen as able to keep saturation>92 Continue heparin gtt, subtherapeutic heparin this morning S/p EKOS procedure by IR  TPA stopped  Fibrinogen: 200 (continue to monitor q6hrs post procedure) CBC q6hrs x4 post procedure  CARDIOVASCULAR A:  H/O HTN  P:  Echo showing severe lvh, g1dd, pa peak=51, RV dilation and hypokinetic  Pending lower extremity doppler Cardiac monitoring Hold hydrochlorothiazide  RENAL A:   Hypokalemia Lactic acidosis in setting of hypoperfusion   P:   Kdur 17meq q4hrs for 2 doses Trend LA Trend BMP  GASTROINTESTINAL A:   H/O GERD  P:   Continue home protonix  HEMATOLOGIC A:   Saddle pulmonary embolism  P:  Monitor cbc, INR  INFECTIOUS A:   None  P:     ENDOCRINE A:   Hyperglycemia, A1c=5.1 H/o hypothyroidism, thyroid goiter s/p thyroidectomy    P:   Continue synthroid Monitor glucose SSI  NEUROLOGIC A:   None  P:   Monitor mentation  FAMILY  - Updates:   Pulmonary and Critical Care Medicine [facility] Pager: [phone]  12/30/2017, 6:51 AM

## 2017-12-30 NOTE — Progress Notes (Signed)
Patient's daughter called and notified about the patient's condition and progress. All questions were answered.   Lars Mage, MD Internal Medicine PGY1 Pager:[phone] 12/30/2017, 4:34 PM

## 2017-12-30 NOTE — Procedures (Signed)
Pulmonary pressures 35/15, mean 23.  Pulled 2-6 french sheaths from right groin.  Held manual pressure for 15 minutes.  No complications.  Tegaderm and gauze applied.  Verified site with RN  France Ravens RT R, [place]

## 2017-12-30 NOTE — Progress Notes (Signed)
   12/30/17 1100  Clinical Encounter Type  Visited With Patient  Visit Type Initial  Referral From Nurse  Consult/Referral To Chaplain  Spiritual Encounters  Spiritual Needs Emotional  Stress Factors  Patient Stress Factors Exhausted  Family Stress Factors None identified    Pt was being helped by direct care nurse. Pt was a wake and seemingly alert. No family on-site but pt requested to share with her daughter about POA paperwork when she arrives tomorrow. Chaplain provided emotional support through reflective listening and compassionate presence.  Aneesa Romey a Medical sales representative, [employer]

## 2017-12-30 NOTE — Progress Notes (Addendum)
1900 Bedside shift report. Pt resting in bed, NAD, no complaints, right groin site checked, level 0. Dressing CDI. Pt denies pain, SOB. Fall precautions in place. WCTM.   1930 Pt assessed, see flow sheet  2030 Report called to Olivia Mackie, RN on 2W. Pt transferring to 2W room 15.  2130 Pt transferred to bed, Olivia Mackie, RN at bedside. Pt transferred to room 2W15 with glasses, top dentures, left hearing aid, pocket book, clothing, and shoes. VSS, no complaints at time of transfer.

## 2017-12-31 LAB — CBC
HCT: 29.4 % — ABNORMAL LOW (ref 36.0–46.0)
Hemoglobin: 9.6 g/dL — ABNORMAL LOW (ref 12.0–15.0)
MCH: 32.2 pg (ref 26.0–34.0)
MCHC: 32.7 g/dL (ref 30.0–36.0)
MCV: 98.7 fL (ref 78.0–100.0)
PLATELETS: 105 10*3/uL — AB (ref 150–400)
RBC: 2.98 MIL/uL — AB (ref 3.87–5.11)
RDW: 13.5 % (ref 11.5–15.5)
WBC: 4.7 10*3/uL (ref 4.0–10.5)

## 2017-12-31 LAB — GLUCOSE, CAPILLARY
GLUCOSE-CAPILLARY: 107 mg/dL — AB (ref 65–99)
GLUCOSE-CAPILLARY: 72 mg/dL (ref 65–99)
GLUCOSE-CAPILLARY: 93 mg/dL (ref 65–99)
Glucose-Capillary: 100 mg/dL — ABNORMAL HIGH (ref 65–99)
Glucose-Capillary: 105 mg/dL — ABNORMAL HIGH (ref 65–99)
Glucose-Capillary: 106 mg/dL — ABNORMAL HIGH (ref 65–99)
Glucose-Capillary: 97 mg/dL (ref 65–99)

## 2017-12-31 LAB — BASIC METABOLIC PANEL
Anion gap: 7 (ref 5–15)
BUN: <5 mg/dL — ABNORMAL LOW (ref 6–20)
CALCIUM: 8.7 mg/dL — AB (ref 8.9–10.3)
CO2: 23 mmol/L (ref 22–32)
Chloride: 107 mmol/L (ref 101–111)
Creatinine, Ser: 0.69 mg/dL (ref 0.44–1.00)
GLUCOSE: 109 mg/dL — AB (ref 65–99)
Potassium: 3.5 mmol/L (ref 3.5–5.1)
Sodium: 137 mmol/L (ref 135–145)

## 2017-12-31 MED ORDER — ANASTROZOLE 1 MG PO TABS
1.0000 mg | ORAL_TABLET | Freq: Every day | ORAL | Status: DC
  Administered 2017-12-31 – 2018-01-03 (×4): 1 mg via ORAL
  Filled 2017-12-31 (×5): qty 1

## 2017-12-31 MED ORDER — POTASSIUM CHLORIDE CRYS ER 20 MEQ PO TBCR
40.0000 meq | EXTENDED_RELEASE_TABLET | Freq: Once | ORAL | Status: AC
  Administered 2017-12-31: 40 meq via ORAL
  Filled 2017-12-31: qty 2

## 2017-12-31 MED ORDER — LEVOTHYROXINE SODIUM 75 MCG PO TABS
150.0000 ug | ORAL_TABLET | Freq: Every day | ORAL | Status: DC
  Administered 2018-01-01 – 2018-01-03 (×3): 150 ug via ORAL
  Filled 2017-12-31 (×3): qty 2

## 2017-12-31 MED ORDER — PANTOPRAZOLE SODIUM 40 MG PO TBEC: 40.0000 mg | DELAYED_RELEASE_TABLET | Freq: Every day | ORAL | Status: DC

## 2017-12-31 NOTE — Progress Notes (Addendum)
PROGRESS NOTE    Sheila Jacobs  JJK:[id number] DOB: 29-Aug-1951 DOA: 12/28/2017 PCP: Velna Hatchet, MD  Brief Narrative: 67 year old female with a history of breast cancer lumpectomy and radiation 2017 admitted to [facility] 12/28/2017 with progressive dyspnea found to have bilateral saddle PE status post lysis by IR 12/29/2017.  TRH picked up 12/31/2017.P CCM would like to keep her in the hospital until she is completely weaned off oxygen.  Assessment & Plan:   Active Problems:   Pulmonary embolism (HCC)  1] acute hypoxic respiratory failure secondary to saddle pulmonary embolism bilateral status post lysis by interventional radiology-heparin has been DC'd and Xarelto has been started pharmacy following.  2] hypothyroidism continue Synthroid.  3] breast cancer restart anastrozole 1 mg daily.  4] history of hypertension currently her blood pressure has been normal to soft.  She took hydrochlorothiazide at home.  5] hypokalemia K of 3.5 replete and recheck labs tomorrow.   DVT prophylaxis: Xarelto Code Status: Full code Family Communication: No family available Disposition Plan: To be determined.  I will obtain PT OT evaluation.   Consultants: PCCM TRANSFER  Procedures: IR LYSIS PE Antimicrobials:  NONE Subjective: Breathing feels better.  Objective: Resting in bed in no acute distress. Vitals:   12/31/17 0051 12/31/17 0148 12/31/17 0353 12/31/17 0721  BP: (!) 86/60 (!) 109/58 116/66 114/66  Pulse: 63 67 61 (!) 57  Resp:   20 20  Temp: 98 F (36.7 C) 98.3 F (36.8 C) 98.1 F (36.7 C) 98.4 F (36.9 C)  TempSrc: Oral Oral Oral Oral  SpO2: 96% 100% 100% 93%  Weight:      Height:        Intake/Output Summary (Last 24 hours) at 12/31/2017 1220 Last data filed at 12/31/2017 0910 Gross per 24 hour  Intake 463 ml  Output 800 ml  Net -337 ml   Filed Weights   12/29/17 0300 12/29/17 1200 12/30/17 2155  Weight: (!) 140.3 kg (309 lb 4.9 oz) 134 kg (295 lb 6.7 oz) 135.9 kg  (299 lb 9.7 oz)    Examination:  General exam: Appears calm and comfortable  Respiratory system: Clear to auscultation. Respiratory effort normal. Cardiovascular system: S1 & S2 heard, RRR. No JVD, murmurs, rubs, gallops or clicks. No pedal edema. Gastrointestinal system: Abdomen is nondistended, soft and nontender. No organomegaly or masses felt. Normal bowel sounds heard. Central nervous system: Alert and oriented. No focal neurological deficits. Extremities: Symmetric 5 x 5 power. Skin: No rashes, lesions or ulcers Psychiatry: Judgement and insight appear normal. Mood & affect appropriate.     Data Reviewed: I have personally reviewed following labs and imaging studies  CBC: Recent Labs  Lab 12/28/17 2126 12/29/17 1715 12/30/17 0317 12/30/17 1517 12/31/17 0250  WBC 6.9 6.1 5.5 5.8 4.7  NEUTROABS 5.0  --   --   --   --   HGB 12.4 11.4* 10.1* 10.3* 9.6*  HCT 36.9 34.3* 30.5* 32.1* 29.4*  MCV 96.3 99.4 97.4 97.9 98.7  PLT 161 124* 101* 77* 371*   Basic Metabolic Panel: Recent Labs  Lab 12/28/17 2126 12/29/17 0442 12/30/17 0317 12/30/17 1950 12/31/17 0250  NA 137 139 137 141 137  K 3.0* 3.6 2.9* 5.4* 3.5  CL 100* 102 107 112* 107  CO2 22 26 23  18* 23  GLUCOSE 183* 112* 104* 138* 109*  BUN 11 11 6 7  <5*  CREATININE 1.07* 0.90 0.72 0.80 0.69  CALCIUM 9.4 9.2 8.1* 9.1 8.7*  MG  --  2.0  1.8  --   --   PHOS  --  2.2* 2.6  --   --    GFR: Estimated Creatinine Clearance: 89.9 mL/min (by C-G formula based on SCr of 0.69 mg/dL). Liver Function Tests: Recent Labs  Lab 12/28/17 2126  AST 33  ALT 11*  ALKPHOS 63  BILITOT 1.2  PROT 8.0  ALBUMIN 3.6   No results for input(s): LIPASE, AMYLASE in the last 168 hours. No results for input(s): AMMONIA in the last 168 hours. Coagulation Profile: Recent Labs  Lab 12/29/17 0937  INR 1.20   Cardiac Enzymes: No results for input(s): CKTOTAL, CKMB, CKMBINDEX, TROPONINI in the last 168 hours. BNP (last 3 results) No  results for input(s): PROBNP in the last 8760 hours. HbA1C: Recent Labs    12/29/17 0442  HGBA1C 5.1   CBG: Recent Labs  Lab 12/30/17 1519 12/31/17 0045 12/31/17 0355 12/31/17 0723 12/31/17 1154  GLUCAP 108* 72 105* 106* 100*   Lipid Profile: No results for input(s): CHOL, HDL, LDLCALC, TRIG, CHOLHDL, LDLDIRECT in the last 72 hours. Thyroid Function Tests: Recent Labs    12/28/17 2126  TSH 12.472*   Anemia Panel: No results for input(s): VITAMINB12, FOLATE, FERRITIN, TIBC, IRON, RETICCTPCT in the last 72 hours. Sepsis Labs: Recent Labs  Lab 12/28/17 2139 12/28/17 2330 12/29/17 1715  LATICACIDVEN 3.56* 2.56* 1.9    Recent Results (from the past 240 hour(s))  MRSA PCR Screening     Status: None   Collection Time: 12/29/17 12:42 PM  Result Value Ref Range Status   MRSA by PCR NEGATIVE NEGATIVE Final    Comment:        The GeneXpert MRSA Assay (FDA approved for NASAL specimens only), is one component of a comprehensive MRSA colonization surveillance program. It is not intended to diagnose MRSA infection nor to guide or monitor treatment for MRSA infections. Performed at [facility], [place] [address], [place], [place] [address]          Radiology Studies: Ir Jacolyn Reedy F/u  Sauer Art/ven Final Day (ms)  Result Date: 12/30/2017 INDICATION: Acute bilateral sub massive pulmonary emboli, status post PE lysis EXAM: PE THROMBO LYSIS FOLLOW-UP, FINAL DAY COMPARISON:  12/29/2017 MEDICATIONS: None. ANESTHESIA/SEDATION: NONE. The patient was continuously monitored during the procedure by the interventional radiology nurse under my direct supervision. FLUOROSCOPY TIME:  Fluoroscopy Time: None. COMPLICATIONS: None immediate. TECHNIQUE: At the bedside, central pulmonary artery pressure measurements obtained. Right femoral access removed. Hemostasis obtained with manual compression. No immediate complication. Patient tolerated the procedure well. FINDINGS: Post lysis PA  pressure: 35/15 mm Hg (23) compared to a pretreatment pressure of 53/25 mm Hg (35) IMPRESSION: Completion of the PE lysis protocol with improvement in the PA pressures as above. Electronically Signed   By: Jerilynn Mages.  Shick M.D.   On: 12/30/2017 11:07        Scheduled Meds: . insulin aspart  0-15 Units Subcutaneous Q4H  . levothyroxine  150 mcg Oral QAC breakfast  . mouth rinse  15 mL Mouth Rinse BID  . pantoprazole  40 mg Oral Daily  . rivaroxaban  15 mg Oral BID   Followed by  . [START ON 01/20/2018] rivaroxaban  20 mg Oral Q supper  . sodium chloride flush  3 mL Intravenous Q12H   Continuous Infusions: . sodium chloride    . sodium chloride    . sodium chloride    . sodium chloride 10 mL/hr at 12/29/17 2230  . sodium chloride  LOS: 2 days      Georgette Shell, MD [facility]  If 7PM-7AM, please contact night-coverage [email or web address] Password [facility] 12/31/2017, 12:20 PM

## 2017-12-31 NOTE — Progress Notes (Signed)
Referring Physician(s): Dr Lamonte Sakai  Supervising Physician: Corrie Mckusick  Patient Status:  [facility] - In-pt  Chief Complaint:  Large volume B PE PE Lysis 4/21 in IR  Subjective:  Doing well today Breathing so much easier Eating well Denies CP; SOB Denies fever/chills Denies N/V  Allergies: Cortisone; Effexor [venlafaxine]; and Lexapro [escitalopram]  Medications: Prior to Admission medications   Medication Sig Start Date End Date Taking? Authorizing Provider  ALPRAZolam (XANAX) 0.25 MG tablet TAKE ONE TABLET BY MOUTH TWICE DAILY AS NEEDED FOR ANXIETY 01/07/17  Yes Henson, Vickie L, NP-C  anastrozole (ARIMIDEX) 1 MG tablet Take 1 tablet (1 mg total) by mouth daily. 11/25/17  Yes Nicholas Lose, MD  aspirin EC 81 MG tablet Take 81 mg by mouth daily.   Yes [provider]  hydrochlorothiazide (HYDRODIURIL) 25 MG tablet Take 25 mg by mouth daily.   Yes [provider]  levothyroxine (SYNTHROID, LEVOTHROID) 150 MCG tablet Take 1 tablet (150 mcg total) by mouth daily. 08/29/17  Yes Elayne Snare, MD  pantoprazole (PROTONIX) 40 MG tablet TAKE ONE TABLET BY MOUTH ONCE DAILY 10/28/17  Yes Burtis Junes, NP  ketoconazole (NIZORAL) 2 % cream APPLY TOPICALLY DAILY AS NEEDED FOR IRRITATION Patient not taking: Reported on 12/28/2017 01/07/17   Wallene Huh, DPM     Vital Signs: BP 114/66 (BP Location: Left Wrist)   Pulse (!) 57   Temp 98.4 F (36.9 C) (Oral)   Resp 20   Ht 5\' 3"  (1.6 m)   Wt 299 lb 9.7 oz (135.9 kg)   SpO2 93%   BMI 53.07 kg/m   Physical Exam  Pulmonary/Chest: Effort normal. No respiratory distress. She has decreased breath sounds in the right upper field.  Abdominal: Soft.  Musculoskeletal: Normal range of motion.  Skin: Skin is warm and dry.  Right groin site is clean and dry NT No bleeding No hematoma   Nursing note and vitals reviewed.   Imaging: Dg Chest 2 View  Result Date: 12/28/2017 CLINICAL DATA:  Increasing shortness of  breath EXAM: CHEST - 2 VIEW COMPARISON:  10/06/2017 FINDINGS: Cardiac shadow is stable. Aortic calcifications are again seen. Mild increased vascular congestion is noted without interstitial edema. The lungs are well aerated bilaterally. No focal infiltrate or sizable effusion is seen. Mild degenerative changes of the thoracic spine are noted. IMPRESSION: Mild increased vascular congestion without edema. No other focal abnormality is seen. Electronically Signed   By: Inez Catalina M.D.   On: 12/28/2017 22:01   Ct Angio Chest Pe W And/or Wo Contrast  Result Date: 12/29/2017 CLINICAL DATA:  Shortness of breath and chest pain EXAM: CT ANGIOGRAPHY CHEST WITH CONTRAST TECHNIQUE: Multidetector CT imaging of the chest was performed using the standard protocol during bolus administration of intravenous contrast. Multiplanar CT image reconstructions and MIPs were obtained to evaluate the vascular anatomy. CONTRAST:  145mL ISOVUE-370 IOPAMIDOL (ISOVUE-370) INJECTION 76% COMPARISON:  Chest radiograph 12/28/2017 FINDINGS: Cardiovascular: Contrast injection is sufficient to demonstrate satisfactory opacification of the pulmonary arteries to the segmental level.There is large volume pulmonary embolus within both distal main pulmonary arteries and extending into the lobar and segmental branches. There is evidence of right heart strain with an RV/LV ratio of approximately 3. The main pulmonary artery is enlarged, measuring 4 cm at the bifurcation. There is a normal 3-vessel arch branching pattern without evidence of acute aortic syndrome. There is mildaortic atherosclerosis. Heart size is normal, without pericardial effusion. Mediastinum/Nodes: No mediastinal, hilar or axillary lymphadenopathy.  The visualized thyroid and thoracic esophageal course are unremarkable. Lungs/Pleura: No pulmonary nodules or masses. No pleural effusion or pneumothorax. No focal airspace consolidation. No focal pleural abnormality. Upper Abdomen:  Contrast bolus timing is not optimized for evaluation of the abdominal organs. Within this limitation, the visualized organs of the upper abdomen are normal. Musculoskeletal: No chest wall abnormality. No acute or significant osseous findings. Review of the MIP images confirms the above findings. IMPRESSION: 1. Positive for acute bilateral main pulmonary artery embolus with CT evidence of right heart strain (RV/LV Ratio = 3), consistent with at least submassive (intermediate risk) PE. Large volume emboli extend into the lobar and segmental arteries of both lungs. The presence of right heart strain has been associated with an increased risk of morbidity and mortality. Please activate Code PE by paging [phone]. 2.  Aortic Atherosclerosis (ICD10-I70.0). Critical Value/emergent results were called by telephone at the time of interpretation on 12/29/2017 at 3:07 am to [facility] , who verbally acknowledged these results. Electronically Signed   By: Ulyses Jarred M.D.   On: 12/29/2017 03:10   Ir Angiogram Pulmonary Bilateral Selective  Result Date: 12/29/2017 INDICATION: 67 year old female with acute large volume bilateral pulmonary emboli and associated cor pulmonale (submassive/intermediate risk pulmonary embolus). She presents for catheter directed bilateral pulmonary arterial thrombolysis. EXAM: BILATERAL PULMONARY ARTERIOGRAPHY; IR INFUSION THROMBOL ARTERIAL INITIAL (MS); IR ULTRASOUND GUIDANCE VASC ACCESS RIGHT; ADDITIONAL ARTERIOGRAPHY 1. Ultrasound-guided access right common femoral vein 2. Second ultrasound-guided access right common femoral vein 3. Catheterization of the main pulmonary artery with main pulmonary arteriogram and pressure measurements 4. Catheterization of the left pulmonary artery with arteriogram 5. Placement of a left lower lobe pulmonary artery multi sidehole ultrasound assisted infusion catheter 6. Catheterization of the right pulmonary artery with arteriogram 7. Placement of a  right lower lobe pulmonary artery multi sidehole ultrasound assisted infusion catheter 8. Initiation of bilateral pulmonary arterial thrombolysis COMPARISON:  CT PE study 12/29/2017 MEDICATIONS: None. ANESTHESIA/SEDATION: Versed 1 mg IV; Fentanyl 50 mcg IV Moderate Sedation Time:  39 minutes The patient was continuously monitored during the procedure by the interventional radiology nurse under my direct supervision. FLUOROSCOPY TIME:  Fluoroscopy Time: 11 minutes 0 seconds (122 mGy). COMPLICATIONS: None immediate. TECHNIQUE: Informed written consent was obtained from the patient after a thorough discussion of the procedural risks, benefits and alternatives. All questions were addressed. Maximal Sterile Barrier Technique was utilized including caps, mask, sterile gowns, sterile gloves, sterile drape, hand hygiene and skin antiseptic. A timeout was performed prior to the initiation of the procedure. The right was interrogated with ultrasound and found to be widely patent. An image was obtained and stored for the medical record. Local anesthesia was attained by infiltration with 1% lidocaine. A small dermatotomy was made. Under real-time sonographic guidance, the vessel was punctured with a 21 gauge micropuncture needle. Using standard technique, the initial micro needle was exchanged over a 0.018 micro wire for a transitional 4 [place] micro sheath. The micro sheath was then exchanged over a 0.035 wire for a 6 French vascular sheath. Using the same technique, a second 6 [place] vascular sheath was also placed into the common femoral vein just lateral to the first. Working through the more medial sheath, a C2 cobra catheter was advanced over a Bentson wire in used to select the main pulmonary artery. A main pulmonary arteriogram was performed. There is massive enlargement of the pulmonary trunk and central left and right pulmonary arteries with very poor opacification of the lobar pulmonary arteries  bilaterally secondary  to acute thrombus. The pulmonary arterial pressure was obtained at 53/25 (35) mm Hg. This is consistent with pulmonary arterial hypertension. The C2 cobra catheter was then navigated into the left main pulmonary artery. A left pulmonary arteriogram was performed. Occlusive thrombus is present throughout the left lower and upper lobe pulmonary arteries. Using a glidewire, the catheter was successfully navigated into the distal aspect of the left lower lobe pulmonary artery. The glidewire was then exchanged for a Rosen wire. The 5 French catheter was removed and a 12 cm infusion length EKOS multi sidehole ultrasound assisted infusion catheter was advanced over the wire and positioned in the left lower lobe pulmonary artery. The C2 cobra catheter was then advanced through the more lateral sheath and navigated into the right main pulmonary artery. A right main pulmonary arteriogram was performed again demonstrating occlusive thrombus within the right upper, middle and lower lobe pulmonary arteries. The catheter was successfully navigated over a Glidewire into the right lower lobe pulmonary artery. The glidewire was then exchanged for a rose in wire. The 5 French catheter was removed and a second 12 cm infusion length EKOS multi sidehole ultrasound assisted infusion catheter was advanced over the wire and positioned in the right lower lobe pulmonary artery. The infusion catheters were connected to tPA and bilateral pulmonary artery arterial thrombolysis was initiated at a rate of 1 milligram/hour per catheter for a total of 2 milligrams/hour. The sheaths were secured with 0 Prolene suture. The patient tolerated the procedure well. FINDINGS: Main PAP: 53/25 (35) mm Hg IMPRESSION: 1. Suspect acute on chronic pulmonary artery hypertension. The pulmonary trunk and main pulmonary arteries are massive suggesting underlying chronic pulmonary arterial hypertension. 2. Occlusive or near occlusive thrombus present within the lobar  arteries of all lobes of both lungs. 3. Successful initiation of bilateral pulmonary artery thrombolysis using ultrasound assisted multi side-hole infusion catheters (EKOS). Signed, Criselda Peaches, MD [facility] Electronically Signed   By: Jacqulynn Cadet M.D.   On: 12/29/2017 11:37   Ir Angiogram Selective Each Additional Vessel  Result Date: 12/29/2017 INDICATION: 67 year old female with acute large volume bilateral pulmonary emboli and associated cor pulmonale (submassive/intermediate risk pulmonary embolus). She presents for catheter directed bilateral pulmonary arterial thrombolysis. EXAM: BILATERAL PULMONARY ARTERIOGRAPHY; IR INFUSION THROMBOL ARTERIAL INITIAL (MS); IR ULTRASOUND GUIDANCE VASC ACCESS RIGHT; ADDITIONAL ARTERIOGRAPHY 1. Ultrasound-guided access right common femoral vein 2. Second ultrasound-guided access right common femoral vein 3. Catheterization of the main pulmonary artery with main pulmonary arteriogram and pressure measurements 4. Catheterization of the left pulmonary artery with arteriogram 5. Placement of a left lower lobe pulmonary artery multi sidehole ultrasound assisted infusion catheter 6. Catheterization of the right pulmonary artery with arteriogram 7. Placement of a right lower lobe pulmonary artery multi sidehole ultrasound assisted infusion catheter 8. Initiation of bilateral pulmonary arterial thrombolysis COMPARISON:  CT PE study 12/29/2017 MEDICATIONS: None. ANESTHESIA/SEDATION: Versed 1 mg IV; Fentanyl 50 mcg IV Moderate Sedation Time:  39 minutes The patient was continuously monitored during the procedure by the interventional radiology nurse under my direct supervision. FLUOROSCOPY TIME:  Fluoroscopy Time: 11 minutes 0 seconds (122 mGy). COMPLICATIONS: None immediate. TECHNIQUE: Informed written consent was obtained from the patient after a thorough discussion of the procedural risks, benefits and  alternatives. All questions were addressed. Maximal Sterile Barrier Technique was utilized including caps, mask, sterile gowns, sterile gloves, sterile drape, hand hygiene and skin antiseptic. A timeout was performed prior to the initiation of  the procedure. The right was interrogated with ultrasound and found to be widely patent. An image was obtained and stored for the medical record. Local anesthesia was attained by infiltration with 1% lidocaine. A small dermatotomy was made. Under real-time sonographic guidance, the vessel was punctured with a 21 gauge micropuncture needle. Using standard technique, the initial micro needle was exchanged over a 0.018 micro wire for a transitional 4 [place] micro sheath. The micro sheath was then exchanged over a 0.035 wire for a 6 French vascular sheath. Using the same technique, a second 6 [place] vascular sheath was also placed into the common femoral vein just lateral to the first. Working through the more medial sheath, a C2 cobra catheter was advanced over a Bentson wire in used to select the main pulmonary artery. A main pulmonary arteriogram was performed. There is massive enlargement of the pulmonary trunk and central left and right pulmonary arteries with very poor opacification of the lobar pulmonary arteries bilaterally secondary to acute thrombus. The pulmonary arterial pressure was obtained at 53/25 (35) mm Hg. This is consistent with pulmonary arterial hypertension. The C2 cobra catheter was then navigated into the left main pulmonary artery. A left pulmonary arteriogram was performed. Occlusive thrombus is present throughout the left lower and upper lobe pulmonary arteries. Using a glidewire, the catheter was successfully navigated into the distal aspect of the left lower lobe pulmonary artery. The glidewire was then exchanged for a Rosen wire. The 5 French catheter was removed and a 12 cm infusion length EKOS multi sidehole ultrasound assisted infusion catheter  was advanced over the wire and positioned in the left lower lobe pulmonary artery. The C2 cobra catheter was then advanced through the more lateral sheath and navigated into the right main pulmonary artery. A right main pulmonary arteriogram was performed again demonstrating occlusive thrombus within the right upper, middle and lower lobe pulmonary arteries. The catheter was successfully navigated over a Glidewire into the right lower lobe pulmonary artery. The glidewire was then exchanged for a rose in wire. The 5 French catheter was removed and a second 12 cm infusion length EKOS multi sidehole ultrasound assisted infusion catheter was advanced over the wire and positioned in the right lower lobe pulmonary artery. The infusion catheters were connected to tPA and bilateral pulmonary artery arterial thrombolysis was initiated at a rate of 1 milligram/hour per catheter for a total of 2 milligrams/hour. The sheaths were secured with 0 Prolene suture. The patient tolerated the procedure well. FINDINGS: Main PAP: 53/25 (35) mm Hg IMPRESSION: 1. Suspect acute on chronic pulmonary artery hypertension. The pulmonary trunk and main pulmonary arteries are massive suggesting underlying chronic pulmonary arterial hypertension. 2. Occlusive or near occlusive thrombus present within the lobar arteries of all lobes of both lungs. 3. Successful initiation of bilateral pulmonary artery thrombolysis using ultrasound assisted multi side-hole infusion catheters (EKOS). Signed, Criselda Peaches, MD [facility] Electronically Signed   By: Jacqulynn Cadet M.D.   On: 12/29/2017 11:37   Ir Angiogram Selective Each Additional Vessel  Result Date: 12/29/2017 INDICATION: 67 year old female with acute large volume bilateral pulmonary emboli and associated cor pulmonale (submassive/intermediate risk pulmonary embolus). She presents for catheter directed bilateral pulmonary  arterial thrombolysis. EXAM: BILATERAL PULMONARY ARTERIOGRAPHY; IR INFUSION THROMBOL ARTERIAL INITIAL (MS); IR ULTRASOUND GUIDANCE VASC ACCESS RIGHT; ADDITIONAL ARTERIOGRAPHY 1. Ultrasound-guided access right common femoral vein 2. Second ultrasound-guided access right common femoral vein 3. Catheterization of the main pulmonary artery with main pulmonary arteriogram  and pressure measurements 4. Catheterization of the left pulmonary artery with arteriogram 5. Placement of a left lower lobe pulmonary artery multi sidehole ultrasound assisted infusion catheter 6. Catheterization of the right pulmonary artery with arteriogram 7. Placement of a right lower lobe pulmonary artery multi sidehole ultrasound assisted infusion catheter 8. Initiation of bilateral pulmonary arterial thrombolysis COMPARISON:  CT PE study 12/29/2017 MEDICATIONS: None. ANESTHESIA/SEDATION: Versed 1 mg IV; Fentanyl 50 mcg IV Moderate Sedation Time:  39 minutes The patient was continuously monitored during the procedure by the interventional radiology nurse under my direct supervision. FLUOROSCOPY TIME:  Fluoroscopy Time: 11 minutes 0 seconds (122 mGy). COMPLICATIONS: None immediate. TECHNIQUE: Informed written consent was obtained from the patient after a thorough discussion of the procedural risks, benefits and alternatives. All questions were addressed. Maximal Sterile Barrier Technique was utilized including caps, mask, sterile gowns, sterile gloves, sterile drape, hand hygiene and skin antiseptic. A timeout was performed prior to the initiation of the procedure. The right was interrogated with ultrasound and found to be widely patent. An image was obtained and stored for the medical record. Local anesthesia was attained by infiltration with 1% lidocaine. A small dermatotomy was made. Under real-time sonographic guidance, the vessel was punctured with a 21 gauge micropuncture needle. Using standard technique, the initial micro needle was  exchanged over a 0.018 micro wire for a transitional 4 [place] micro sheath. The micro sheath was then exchanged over a 0.035 wire for a 6 French vascular sheath. Using the same technique, a second 6 [place] vascular sheath was also placed into the common femoral vein just lateral to the first. Working through the more medial sheath, a C2 cobra catheter was advanced over a Bentson wire in used to select the main pulmonary artery. A main pulmonary arteriogram was performed. There is massive enlargement of the pulmonary trunk and central left and right pulmonary arteries with very poor opacification of the lobar pulmonary arteries bilaterally secondary to acute thrombus. The pulmonary arterial pressure was obtained at 53/25 (35) mm Hg. This is consistent with pulmonary arterial hypertension. The C2 cobra catheter was then navigated into the left main pulmonary artery. A left pulmonary arteriogram was performed. Occlusive thrombus is present throughout the left lower and upper lobe pulmonary arteries. Using a glidewire, the catheter was successfully navigated into the distal aspect of the left lower lobe pulmonary artery. The glidewire was then exchanged for a Rosen wire. The 5 French catheter was removed and a 12 cm infusion length EKOS multi sidehole ultrasound assisted infusion catheter was advanced over the wire and positioned in the left lower lobe pulmonary artery. The C2 cobra catheter was then advanced through the more lateral sheath and navigated into the right main pulmonary artery. A right main pulmonary arteriogram was performed again demonstrating occlusive thrombus within the right upper, middle and lower lobe pulmonary arteries. The catheter was successfully navigated over a Glidewire into the right lower lobe pulmonary artery. The glidewire was then exchanged for a rose in wire. The 5 French catheter was removed and a second 12 cm infusion length EKOS multi sidehole ultrasound assisted infusion catheter  was advanced over the wire and positioned in the right lower lobe pulmonary artery. The infusion catheters were connected to tPA and bilateral pulmonary artery arterial thrombolysis was initiated at a rate of 1 milligram/hour per catheter for a total of 2 milligrams/hour. The sheaths were secured with 0 Prolene suture. The patient tolerated the procedure well. FINDINGS: Main PAP: 53/25 (35) mm Hg IMPRESSION:  1. Suspect acute on chronic pulmonary artery hypertension. The pulmonary trunk and main pulmonary arteries are massive suggesting underlying chronic pulmonary arterial hypertension. 2. Occlusive or near occlusive thrombus present within the lobar arteries of all lobes of both lungs. 3. Successful initiation of bilateral pulmonary artery thrombolysis using ultrasound assisted multi side-hole infusion catheters (EKOS). Signed, Criselda Peaches, MD [facility] Electronically Signed   By: Jacqulynn Cadet M.D.   On: 12/29/2017 11:37   Ir US Guide Vasc Access Right  Result Date: 12/29/2017 INDICATION: 67 year old female with acute large volume bilateral pulmonary emboli and associated cor pulmonale (submassive/intermediate risk pulmonary embolus). She presents for catheter directed bilateral pulmonary arterial thrombolysis. EXAM: BILATERAL PULMONARY ARTERIOGRAPHY; IR INFUSION THROMBOL ARTERIAL INITIAL (MS); IR ULTRASOUND GUIDANCE VASC ACCESS RIGHT; ADDITIONAL ARTERIOGRAPHY 1. Ultrasound-guided access right common femoral vein 2. Second ultrasound-guided access right common femoral vein 3. Catheterization of the main pulmonary artery with main pulmonary arteriogram and pressure measurements 4. Catheterization of the left pulmonary artery with arteriogram 5. Placement of a left lower lobe pulmonary artery multi sidehole ultrasound assisted infusion catheter 6. Catheterization of the right pulmonary artery with arteriogram 7. Placement of a right lower  lobe pulmonary artery multi sidehole ultrasound assisted infusion catheter 8. Initiation of bilateral pulmonary arterial thrombolysis COMPARISON:  CT PE study 12/29/2017 MEDICATIONS: None. ANESTHESIA/SEDATION: Versed 1 mg IV; Fentanyl 50 mcg IV Moderate Sedation Time:  39 minutes The patient was continuously monitored during the procedure by the interventional radiology nurse under my direct supervision. FLUOROSCOPY TIME:  Fluoroscopy Time: 11 minutes 0 seconds (122 mGy). COMPLICATIONS: None immediate. TECHNIQUE: Informed written consent was obtained from the patient after a thorough discussion of the procedural risks, benefits and alternatives. All questions were addressed. Maximal Sterile Barrier Technique was utilized including caps, mask, sterile gowns, sterile gloves, sterile drape, hand hygiene and skin antiseptic. A timeout was performed prior to the initiation of the procedure. The right was interrogated with ultrasound and found to be widely patent. An image was obtained and stored for the medical record. Local anesthesia was attained by infiltration with 1% lidocaine. A small dermatotomy was made. Under real-time sonographic guidance, the vessel was punctured with a 21 gauge micropuncture needle. Using standard technique, the initial micro needle was exchanged over a 0.018 micro wire for a transitional 4 [place] micro sheath. The micro sheath was then exchanged over a 0.035 wire for a 6 French vascular sheath. Using the same technique, a second 6 [place] vascular sheath was also placed into the common femoral vein just lateral to the first. Working through the more medial sheath, a C2 cobra catheter was advanced over a Bentson wire in used to select the main pulmonary artery. A main pulmonary arteriogram was performed. There is massive enlargement of the pulmonary trunk and central left and right pulmonary arteries with very poor opacification of the lobar pulmonary arteries bilaterally secondary to acute  thrombus. The pulmonary arterial pressure was obtained at 53/25 (35) mm Hg. This is consistent with pulmonary arterial hypertension. The C2 cobra catheter was then navigated into the left main pulmonary artery. A left pulmonary arteriogram was performed. Occlusive thrombus is present throughout the left lower and upper lobe pulmonary arteries. Using a glidewire, the catheter was successfully navigated into the distal aspect of the left lower lobe pulmonary artery. The glidewire was then exchanged for a Rosen wire. The 5 French catheter was removed and a 12 cm infusion length EKOS multi sidehole ultrasound assisted infusion catheter  was advanced over the wire and positioned in the left lower lobe pulmonary artery. The C2 cobra catheter was then advanced through the more lateral sheath and navigated into the right main pulmonary artery. A right main pulmonary arteriogram was performed again demonstrating occlusive thrombus within the right upper, middle and lower lobe pulmonary arteries. The catheter was successfully navigated over a Glidewire into the right lower lobe pulmonary artery. The glidewire was then exchanged for a rose in wire. The 5 French catheter was removed and a second 12 cm infusion length EKOS multi sidehole ultrasound assisted infusion catheter was advanced over the wire and positioned in the right lower lobe pulmonary artery. The infusion catheters were connected to tPA and bilateral pulmonary artery arterial thrombolysis was initiated at a rate of 1 milligram/hour per catheter for a total of 2 milligrams/hour. The sheaths were secured with 0 Prolene suture. The patient tolerated the procedure well. FINDINGS: Main PAP: 53/25 (35) mm Hg IMPRESSION: 1. Suspect acute on chronic pulmonary artery hypertension. The pulmonary trunk and main pulmonary arteries are massive suggesting underlying chronic pulmonary arterial hypertension. 2. Occlusive or near occlusive thrombus present within the lobar arteries  of all lobes of both lungs. 3. Successful initiation of bilateral pulmonary artery thrombolysis using ultrasound assisted multi side-hole infusion catheters (EKOS). Signed, Criselda Peaches, MD [facility] Radiology Electronically Signed   By: Jacqulynn Cadet M.D.   On: 12/29/2017 11:37   Ir Infusion Thrombol Arterial Initial (ms)  Result Date: 12/29/2017 INDICATION: 67 year old female with acute large volume bilateral pulmonary emboli and associated cor pulmonale (submassive/intermediate risk pulmonary embolus). She presents for catheter directed bilateral pulmonary arterial thrombolysis. EXAM: BILATERAL PULMONARY ARTERIOGRAPHY; IR INFUSION THROMBOL ARTERIAL INITIAL (MS); IR ULTRASOUND GUIDANCE VASC ACCESS RIGHT; ADDITIONAL ARTERIOGRAPHY 1. Ultrasound-guided access right common femoral vein 2. Second ultrasound-guided access right common femoral vein 3. Catheterization of the main pulmonary artery with main pulmonary arteriogram and pressure measurements 4. Catheterization of the left pulmonary artery with arteriogram 5. Placement of a left lower lobe pulmonary artery multi sidehole ultrasound assisted infusion catheter 6. Catheterization of the right pulmonary artery with arteriogram 7. Placement of a right lower lobe pulmonary artery multi sidehole ultrasound assisted infusion catheter 8. Initiation of bilateral pulmonary arterial thrombolysis COMPARISON:  CT PE study 12/29/2017 MEDICATIONS: None. ANESTHESIA/SEDATION: Versed 1 mg IV; Fentanyl 50 mcg IV Moderate Sedation Time:  39 minutes The patient was continuously monitored during the procedure by the interventional radiology nurse under my direct supervision. FLUOROSCOPY TIME:  Fluoroscopy Time: 11 minutes 0 seconds (122 mGy). COMPLICATIONS: None immediate. TECHNIQUE: Informed written consent was obtained from the patient after a thorough discussion of the procedural risks, benefits and alternatives.  All questions were addressed. Maximal Sterile Barrier Technique was utilized including caps, mask, sterile gowns, sterile gloves, sterile drape, hand hygiene and skin antiseptic. A timeout was performed prior to the initiation of the procedure. The right was interrogated with ultrasound and found to be widely patent. An image was obtained and stored for the medical record. Local anesthesia was attained by infiltration with 1% lidocaine. A small dermatotomy was made. Under real-time sonographic guidance, the vessel was punctured with a 21 gauge micropuncture needle. Using standard technique, the initial micro needle was exchanged over a 0.018 micro wire for a transitional 4 [place] micro sheath. The micro sheath was then exchanged over a 0.035 wire for a 6 French vascular sheath. Using the same technique, a second 6 [place] vascular sheath was also placed into  the common femoral vein just lateral to the first. Working through the more medial sheath, a C2 cobra catheter was advanced over a Bentson wire in used to select the main pulmonary artery. A main pulmonary arteriogram was performed. There is massive enlargement of the pulmonary trunk and central left and right pulmonary arteries with very poor opacification of the lobar pulmonary arteries bilaterally secondary to acute thrombus. The pulmonary arterial pressure was obtained at 53/25 (35) mm Hg. This is consistent with pulmonary arterial hypertension. The C2 cobra catheter was then navigated into the left main pulmonary artery. A left pulmonary arteriogram was performed. Occlusive thrombus is present throughout the left lower and upper lobe pulmonary arteries. Using a glidewire, the catheter was successfully navigated into the distal aspect of the left lower lobe pulmonary artery. The glidewire was then exchanged for a Rosen wire. The 5 French catheter was removed and a 12 cm infusion length EKOS multi sidehole ultrasound assisted infusion catheter was advanced  over the wire and positioned in the left lower lobe pulmonary artery. The C2 cobra catheter was then advanced through the more lateral sheath and navigated into the right main pulmonary artery. A right main pulmonary arteriogram was performed again demonstrating occlusive thrombus within the right upper, middle and lower lobe pulmonary arteries. The catheter was successfully navigated over a Glidewire into the right lower lobe pulmonary artery. The glidewire was then exchanged for a rose in wire. The 5 French catheter was removed and a second 12 cm infusion length EKOS multi sidehole ultrasound assisted infusion catheter was advanced over the wire and positioned in the right lower lobe pulmonary artery. The infusion catheters were connected to tPA and bilateral pulmonary artery arterial thrombolysis was initiated at a rate of 1 milligram/hour per catheter for a total of 2 milligrams/hour. The sheaths were secured with 0 Prolene suture. The patient tolerated the procedure well. FINDINGS: Main PAP: 53/25 (35) mm Hg IMPRESSION: 1. Suspect acute on chronic pulmonary artery hypertension. The pulmonary trunk and main pulmonary arteries are massive suggesting underlying chronic pulmonary arterial hypertension. 2. Occlusive or near occlusive thrombus present within the lobar arteries of all lobes of both lungs. 3. Successful initiation of bilateral pulmonary artery thrombolysis using ultrasound assisted multi side-hole infusion catheters (EKOS). Signed, Criselda Peaches, MD [facility] Radiology Electronically Signed   By: Jacqulynn Cadet M.D.   On: 12/29/2017 11:37   Ir Infusion Thrombol Arterial Initial (ms)  Result Date: 12/29/2017 INDICATION: 67 year old female with acute large volume bilateral pulmonary emboli and associated cor pulmonale (submassive/intermediate risk pulmonary embolus). She presents for catheter directed bilateral pulmonary arterial  thrombolysis. EXAM: BILATERAL PULMONARY ARTERIOGRAPHY; IR INFUSION THROMBOL ARTERIAL INITIAL (MS); IR ULTRASOUND GUIDANCE VASC ACCESS RIGHT; ADDITIONAL ARTERIOGRAPHY 1. Ultrasound-guided access right common femoral vein 2. Second ultrasound-guided access right common femoral vein 3. Catheterization of the main pulmonary artery with main pulmonary arteriogram and pressure measurements 4. Catheterization of the left pulmonary artery with arteriogram 5. Placement of a left lower lobe pulmonary artery multi sidehole ultrasound assisted infusion catheter 6. Catheterization of the right pulmonary artery with arteriogram 7. Placement of a right lower lobe pulmonary artery multi sidehole ultrasound assisted infusion catheter 8. Initiation of bilateral pulmonary arterial thrombolysis COMPARISON:  CT PE study 12/29/2017 MEDICATIONS: None. ANESTHESIA/SEDATION: Versed 1 mg IV; Fentanyl 50 mcg IV Moderate Sedation Time:  39 minutes The patient was continuously monitored during the procedure by the interventional radiology nurse under my direct supervision. FLUOROSCOPY TIME:  Fluoroscopy Time:  11 minutes 0 seconds (122 mGy). COMPLICATIONS: None immediate. TECHNIQUE: Informed written consent was obtained from the patient after a thorough discussion of the procedural risks, benefits and alternatives. All questions were addressed. Maximal Sterile Barrier Technique was utilized including caps, mask, sterile gowns, sterile gloves, sterile drape, hand hygiene and skin antiseptic. A timeout was performed prior to the initiation of the procedure. The right was interrogated with ultrasound and found to be widely patent. An image was obtained and stored for the medical record. Local anesthesia was attained by infiltration with 1% lidocaine. A small dermatotomy was made. Under real-time sonographic guidance, the vessel was punctured with a 21 gauge micropuncture needle. Using standard technique, the initial micro needle was exchanged over a  0.018 micro wire for a transitional 4 [place] micro sheath. The micro sheath was then exchanged over a 0.035 wire for a 6 French vascular sheath. Using the same technique, a second 6 [place] vascular sheath was also placed into the common femoral vein just lateral to the first. Working through the more medial sheath, a C2 cobra catheter was advanced over a Bentson wire in used to select the main pulmonary artery. A main pulmonary arteriogram was performed. There is massive enlargement of the pulmonary trunk and central left and right pulmonary arteries with very poor opacification of the lobar pulmonary arteries bilaterally secondary to acute thrombus. The pulmonary arterial pressure was obtained at 53/25 (35) mm Hg. This is consistent with pulmonary arterial hypertension. The C2 cobra catheter was then navigated into the left main pulmonary artery. A left pulmonary arteriogram was performed. Occlusive thrombus is present throughout the left lower and upper lobe pulmonary arteries. Using a glidewire, the catheter was successfully navigated into the distal aspect of the left lower lobe pulmonary artery. The glidewire was then exchanged for a Rosen wire. The 5 French catheter was removed and a 12 cm infusion length EKOS multi sidehole ultrasound assisted infusion catheter was advanced over the wire and positioned in the left lower lobe pulmonary artery. The C2 cobra catheter was then advanced through the more lateral sheath and navigated into the right main pulmonary artery. A right main pulmonary arteriogram was performed again demonstrating occlusive thrombus within the right upper, middle and lower lobe pulmonary arteries. The catheter was successfully navigated over a Glidewire into the right lower lobe pulmonary artery. The glidewire was then exchanged for a rose in wire. The 5 French catheter was removed and a second 12 cm infusion length EKOS multi sidehole ultrasound assisted infusion catheter was advanced over  the wire and positioned in the right lower lobe pulmonary artery. The infusion catheters were connected to tPA and bilateral pulmonary artery arterial thrombolysis was initiated at a rate of 1 milligram/hour per catheter for a total of 2 milligrams/hour. The sheaths were secured with 0 Prolene suture. The patient tolerated the procedure well. FINDINGS: Main PAP: 53/25 (35) mm Hg IMPRESSION: 1. Suspect acute on chronic pulmonary artery hypertension. The pulmonary trunk and main pulmonary arteries are massive suggesting underlying chronic pulmonary arterial hypertension. 2. Occlusive or near occlusive thrombus present within the lobar arteries of all lobes of both lungs. 3. Successful initiation of bilateral pulmonary artery thrombolysis using ultrasound assisted multi side-hole infusion catheters (EKOS). Signed, Criselda Peaches, MD [facility] Inter[facility] Radiology Special[facility] Radiology Electronically Signed   By: Jacqulynn Cadet M.D.   On: 12/29/2017 11:37   Ir Jacolyn Reedy F/u Elizabeth Sauer Art/ven Final Day (ms)  Result Date: 12/30/2017 INDICATION: Acute bilateral sub massive pulmonary emboli,  status post PE lysis EXAM: PE THROMBO LYSIS FOLLOW-UP, FINAL DAY COMPARISON:  12/29/2017 MEDICATIONS: None. ANESTHESIA/SEDATION: NONE. The patient was continuously monitored during the procedure by the interventional radiology nurse under my direct supervision. FLUOROSCOPY TIME:  Fluoroscopy Time: None. COMPLICATIONS: None immediate. TECHNIQUE: At the bedside, central pulmonary artery pressure measurements obtained. Right femoral access removed. Hemostasis obtained with manual compression. No immediate complication. Patient tolerated the procedure well. FINDINGS: Post lysis PA pressure: 35/15 mm Hg (23) compared to a pretreatment pressure of 53/25 mm Hg (35) IMPRESSION: Completion of the PE lysis protocol with improvement in the PA pressures as above. Electronically Signed   By: Jerilynn Mages.  Shick M.D.   On: 12/30/2017  11:07    Labs:  CBC: Recent Labs    12/29/17 1715 12/30/17 0317 12/30/17 1517 12/31/17 0250  WBC 6.1 5.5 5.8 4.7  HGB 11.4* 10.1* 10.3* 9.6*  HCT 34.3* 30.5* 32.1* 29.4*  PLT 124* 101* 77* 105*    COAGS: Recent Labs    12/29/17 0937  INR 1.20    BMP: Recent Labs    12/29/17 0442 12/30/17 0317 12/30/17 1950 12/31/17 0250  NA 139 137 141 137  K 3.6 2.9* 5.4* 3.5  CL 102 107 112* 107  CO2 26 23 18* 23  GLUCOSE 112* 104* 138* 109*  BUN 11 6 7  <5*  CALCIUM 9.2 8.1* 9.1 8.7*  CREATININE 0.90 0.72 0.80 0.69  GFRNONAA >60 >60 >60 >60  GFRAA >60 >60 >60 >60    LIVER FUNCTION TESTS: Recent Labs    11/25/17 1026 12/28/17 2126  BILITOT 0.4 1.2  AST 19 33  ALT 11 11*  ALKPHOS 74 63  PROT 7.8 8.0  ALBUMIN 3.6 3.6    Assessment and Plan:  B PE Thrombolysis in IR 4/21 Better daily Breathing easier Plan per CCM  Electronically Signed: Darneshia Demary A, PA-C 12/31/2017, 10:58 AM   I spent a total of 15 Minutes at the the patient's bedside AND on the patient's hospital floor or unit, greater than 50% of which was counseling/coordinating care for B PE/ thrombolysis

## 2017-12-31 NOTE — Evaluation (Signed)
Physical Therapy Evaluation Patient Details Name: Sheila Jacobs MRN: [id number] DOB: 1950-12-14 Today's Date: 12/31/2017   History of Present Illness  67yo female who presented to the ED on 12/28/17 with progressive dyspnea, found SpO2 at 70% on room air and positive CTA for saddle PE in the ED. A R LE DVT was also found on 12/30/17. Patient in therapeutic window of Xarelto at time of PT eval. PMH vertigo, goiter, obesity, HTN, HOH, hx breast CA, cardiac cath, thryoidectomy  Clinical Impression   Patient received in bed, very pleasant and willing to participate in PT today; she describes her own home but states she is living with her daughter and is very vague about the setup of  Her daughter's home despite ongoing questions from PT. She requires MinA to complete functional bed mobility and functional transfers, but once standing was able to ambulate approximately 78f around the bed to the recliner today. SpO2 remained at 100% on 3LPM during session. She was left up in the chair with all needs met this afternoon. She will continue to benefit from skilled PT services in the acute setting, as well as skilled HHPT services to further address functional deficits moving forward.     Follow Up Recommendations Home health PT    Equipment Recommendations  Rolling walker with 5" wheels;3in1 (PT);Other (comment)(shower seat )    Recommendations for Other Services       Precautions / Restrictions Precautions Precautions: Fall Restrictions Weight Bearing Restrictions: No      Mobility  Bed Mobility Overal bed mobility: Needs Assistance Bed Mobility: Supine to Sit     Supine to sit: Min assist     General bed mobility comments: MinA to bring trunk up and steady at EOB   Transfers Overall transfer level: Needs assistance Equipment used: Rolling walker (2 wheeled) Transfers: Sit to/from Stand Sit to Stand: Min assist         General transfer comment: MinA for light boost and cues to  steady before initiating gait   Ambulation/Gait Ambulation/Gait assistance: Min guard Ambulation Distance (Feet): 15 Feet Assistive device: Rolling walker (2 wheeled) Gait Pattern/deviations: Step-through pattern;Decreased step length - left;Decreased stance time - right;Decreased weight shift to right;Antalgic;Wide base of support     General Gait Details: wide BOS with antalgic pattern, reduced weight bearing R LE due to pain but steady with RW   Stairs            Wheelchair Mobility    Modified Rankin (Stroke Patients Only)       Balance Overall balance assessment: Mild deficits observed, not formally tested                                           Pertinent Vitals/Pain Pain Assessment: Faces Faces Pain Scale: Hurts little more Pain Location: R LE  Pain Descriptors / Indicators: Aching;Sore Pain Intervention(s): Limited activity within patient's tolerance;Monitored during session;Repositioned    Home Living Family/patient expects to be discharged to:: Private residence Living Arrangements: Children Available Help at Discharge: Family Type of Home: House Home Access: Level entry     Home Layout: One level Home Equipment: None Additional Comments: patient reports that she has no stairs at her home but she has been staying with her daughter, unclear about setting at her daughter's home     Prior Function Level of Independence: Independent  Hand Dominance        Extremity/Trunk Assessment        Lower Extremity Assessment Lower Extremity Assessment: Generalized weakness    Cervical / Trunk Assessment Cervical / Trunk Assessment: Normal  Communication   Communication: HOH  Cognition Arousal/Alertness: Awake/alert Behavior During Therapy: WFL for tasks assessed/performed Overall Cognitive Status: Within Functional Limits for tasks assessed                                        General  Comments General comments (skin integrity, edema, etc.): SpO2 remained at 100% on 3LPM O2 during session     Exercises     Assessment/Plan    PT Assessment Patient needs continued PT services  PT Problem List Decreased strength;Decreased mobility;Decreased safety awareness;Decreased coordination;Obesity;Decreased balance       PT Treatment Interventions DME instruction;Therapeutic activities;Gait training;Therapeutic exercise;Patient/family education;Stair training;Balance training;Functional mobility training;Neuromuscular re-education    PT Goals (Current goals can be found in the Care Plan section)  Acute Rehab PT Goals Patient Stated Goal: to go home, feel better  PT Goal Formulation: With patient Time For Goal Achievement: 01/14/18 Potential to Achieve Goals: Good    Frequency Min 3X/week   Barriers to discharge        Co-evaluation               AM-PAC PT "6 Clicks" Daily Activity  Outcome Measure Difficulty turning over in bed (including adjusting bedclothes, sheets and blankets)?: Unable Difficulty moving from lying on back to sitting on the side of the bed? : Unable Difficulty sitting down on and standing up from a chair with arms (e.g., wheelchair, bedside commode, etc,.)?: Unable Help needed moving to and from a bed to chair (including a wheelchair)?: A Little Help needed walking in hospital room?: A Little Help needed climbing 3-5 steps with a railing? : A Lot 6 Click Score: 11    End of Session Equipment Utilized During Treatment: Gait belt Activity Tolerance: Patient tolerated treatment well;Patient limited by pain Patient left: in chair;with call bell/phone within reach   PT Visit Diagnosis: Muscle weakness (generalized) (M62.81);Difficulty in walking, not elsewhere classified (R26.2)    Time: 5208-0223 PT Time Calculation (min) (ACUTE ONLY): 28 min   Charges:   PT Evaluation $PT Eval Moderate Complexity: 1 Mod PT Treatments $Gait Training:  8-22 mins   PT G Codes:        Deniece Ree PT, DPT, CBIS  Supplemental Physical Therapist [place]   Pager [phone]

## 2017-12-31 NOTE — Discharge Instructions (Signed)

## 2018-01-01 DIAGNOSIS — R0902 Hypoxemia: Secondary | ICD-10-CM

## 2018-01-01 LAB — BASIC METABOLIC PANEL
Anion gap: 8 (ref 5–15)
BUN: 7 mg/dL (ref 6–20)
CALCIUM: 8.7 mg/dL — AB (ref 8.9–10.3)
CO2: 24 mmol/L (ref 22–32)
CREATININE: 0.79 mg/dL (ref 0.44–1.00)
Chloride: 105 mmol/L (ref 101–111)
GFR calc Af Amer: >60 mL/min (ref >60)
GLUCOSE: 124 mg/dL — AB (ref 65–99)
Potassium: 3.8 mmol/L (ref 3.5–5.1)
Sodium: 137 mmol/L (ref 135–145)

## 2018-01-01 LAB — CBC WITH DIFFERENTIAL/PLATELET
Basophils Absolute: 0 10*3/uL (ref 0.0–0.1)
Basophils Relative: 0 %
EOS PCT: 5 %
Eosinophils Absolute: 0.2 10*3/uL (ref 0.0–0.7)
HCT: 30.8 % — ABNORMAL LOW (ref 36.0–46.0)
Hemoglobin: 9.9 g/dL — ABNORMAL LOW (ref 12.0–15.0)
LYMPHS ABS: 1.6 10*3/uL (ref 0.7–4.0)
LYMPHS PCT: 36 %
MCH: 31.6 pg (ref 26.0–34.0)
MCHC: 32.1 g/dL (ref 30.0–36.0)
MCV: 98.4 fL (ref 78.0–100.0)
MONO ABS: 0.4 10*3/uL (ref 0.1–1.0)
Monocytes Relative: 9 %
Neutro Abs: 2.2 10*3/uL (ref 1.7–7.7)
Neutrophils Relative %: 50 %
PLATELETS: 131 10*3/uL — AB (ref 150–400)
RBC: 3.13 MIL/uL — ABNORMAL LOW (ref 3.87–5.11)
RDW: 13.3 % (ref 11.5–15.5)
WBC: 4.4 10*3/uL (ref 4.0–10.5)

## 2018-01-01 LAB — GLUCOSE, CAPILLARY
GLUCOSE-CAPILLARY: 92 mg/dL (ref 65–99)
GLUCOSE-CAPILLARY: 98 mg/dL (ref 65–99)
Glucose-Capillary: 110 mg/dL — ABNORMAL HIGH (ref 65–99)
Glucose-Capillary: 110 mg/dL — ABNORMAL HIGH (ref 65–99)
Glucose-Capillary: 95 mg/dL (ref 65–99)
Glucose-Capillary: 126 mg/dL — ABNORMAL HIGH (ref 65–99)

## 2018-01-01 MED ORDER — ACETAMINOPHEN 325 MG PO TABS
650.0000 mg | ORAL_TABLET | Freq: Four times a day (QID) | ORAL | Status: DC | PRN
  Administered 2018-01-01: 650 mg via ORAL
  Filled 2018-01-01: qty 2

## 2018-01-01 NOTE — Progress Notes (Addendum)
From home alone, states she will be going home with her daughter [name], [place] left [facility] agency list in room for daughter to look over to choose agency she would like patient to work with.  Patient states she would like to get rolling walker thru [facility] and she does not want the 3 n 1. NCM gave patient 30 day free card for xarelto, she has [employer] so her co pay for refills will be around 3.50.

## 2018-01-01 NOTE — Progress Notes (Signed)
Physical Therapy Treatment Patient Details Name: Sheila Jacobs MRN: [id number] DOB: 05/18/1951 Today's Date: 01/01/2018    History of Present Illness 67yo female who presented to the ED on 12/28/17 with progressive dyspnea, found SpO2 at 70% on room air and positive CTA for saddle PE in the ED. A R LE DVT was also found on 12/30/17. Patient in therapeutic window of Xarelto at time of PT eval. PMH vertigo, goiter, obesity, HTN, HOH, hx breast CA, cardiac cath, thryoidectomy    PT Comments    Patient seen for mobility progression. Tolerated activity well today. Ambulated on room air with saturations >95%. Modest DOE.   Follow Up Recommendations  Home health PT     Equipment Recommendations  Rolling walker with 5" wheels;3in1 (PT);Other (comment)(shower seat )    Recommendations for Other Services       Precautions / Restrictions Precautions Precautions: Fall Restrictions Weight Bearing Restrictions: No    Mobility  Bed Mobility               General bed mobility comments: received in chair  Transfers Overall transfer level: Needs assistance Equipment used: Rolling walker (2 wheeled) Transfers: Sit to/from Stand Sit to Stand: Supervision         General transfer comment: supervision for safety no physical assist required  Ambulation/Gait Ambulation/Gait assistance: Supervision Ambulation Distance (Feet): 120 Feet Assistive device: Rolling walker (2 wheeled) Gait Pattern/deviations: Step-through pattern;Decreased step length - left;Decreased stance time - right;Decreased weight shift to right;Antalgic;Wide base of support Gait velocity: decreased Gait velocity interpretation: <1.8 ft/sec, indicate of risk for recurrent falls General Gait Details: wide BOS ambulated on room air with saturations >95%    Stairs             Wheelchair Mobility    Modified Rankin (Stroke Patients Only)       Balance Overall balance assessment: Mild deficits  observed, not formally tested                                          Cognition Arousal/Alertness: Awake/alert Behavior During Therapy: WFL for tasks assessed/performed Overall Cognitive Status: Within Functional Limits for tasks assessed                                        Exercises      General Comments General comments (skin integrity, edema, etc.): ambulated on room air saturations >95% throughout with 2/5 DOE      Pertinent Vitals/Pain Pain Assessment: Faces Faces Pain Scale: Hurts little more Pain Location: back of right LE Pain Descriptors / Indicators: Aching;Sore Pain Intervention(s): Monitored during session    Home Living                      Prior Function            PT Goals (current goals can now be found in the care plan section) Acute Rehab PT Goals Patient Stated Goal: to go home, feel better  PT Goal Formulation: With patient Time For Goal Achievement: 01/14/18 Potential to Achieve Goals: Good Progress towards PT goals: Progressing toward goals    Frequency    Min 3X/week      PT Plan      Co-evaluation  AM-PAC PT "6 Clicks" Daily Activity  Outcome Measure  Difficulty turning over in bed (including adjusting bedclothes, sheets and blankets)?: Unable Difficulty moving from lying on back to sitting on the side of the bed? : Unable Difficulty sitting down on and standing up from a chair with arms (e.g., wheelchair, bedside commode, etc,.)?: Unable Help needed moving to and from a bed to chair (including a wheelchair)?: A Little Help needed walking in hospital room?: A Little Help needed climbing 3-5 steps with a railing? : A Lot 6 Click Score: 11    End of Session Equipment Utilized During Treatment: Gait belt Activity Tolerance: Patient tolerated treatment well;Patient limited by pain Patient left: in chair;with call bell/phone within reach   PT Visit Diagnosis: Muscle  weakness (generalized) (M62.81);Difficulty in walking, not elsewhere classified (R26.2)     Time: 3734-2876 PT Time Calculation (min) (ACUTE ONLY): 17 min  Charges:  $Gait Training: 8-22 mins                    G Codes:       Alben Deeds, PT DPT  Board Certified Neurologic Specialist [place] 01/01/2018, 3:09 PM

## 2018-01-01 NOTE — Progress Notes (Signed)
PROGRESS NOTE    Sheila Jacobs  ZOX:[id number] DOB: August 03, 1951 DOA: 12/28/2017 PCP: Velna Hatchet, MD  Brief Narrative: 67 year old female with a history of breast cancer lumpectomy and radiation 2017 admitted to [facility] 12/28/2017 with progressive dyspnea found to have bilateral saddle PE status post lysis by IR 12/29/2017.  TRH picked up 12/31/2017.P CCM would like to keep her in the hospital until she is completely weaned off oxygen.     Assessment & Plan:   Active Problems:   Pulmonary embolism (HCC) 1] acute hypoxic respiratory failure secondary to saddle pulmonary embolism bilateral status post lysis by interventional radiology-heparin has been DC'd and Xarelto has been started pharmacy following.  2] hypothyroidism continue Synthroid.  3] breast cancer restart anastrozole 1 mg daily.  4] history of hypertension currently her blood pressure has been normal to soft.  She took hydrochlorothiazide at home.  5] hypokalemia K of 3.5 replete and recheck labs tomorrow.      DVT prophylaxis: Xarelto Code Status: Full code Family Communication: No family available Disposition Plan:  If able to wean off oxygen will plan for discharge tomorrow or the day after. Consultants:  PCCM transfer Procedures: IR lysis of PE Antimicrobials: None  Subjective: No complaints resting in bed feels breathing is better.   Objective: Vitals:   12/31/17 2337 01/01/18 0300 01/01/18 0806 01/01/18 0900  BP: 111/61 114/62 (!) 106/55   Pulse: 71 66 72   Resp:  16 19   Temp:  97.9 F (36.6 C) 98.4 F (36.9 C)   TempSrc:  Oral Oral   SpO2: 99% 98% 100% 94%  Weight:      Height:        Intake/Output Summary (Last 24 hours) at 01/01/2018 1233 Last data filed at 01/01/2018 1205 Gross per 24 hour  Intake 1280 ml  Output 2375 ml  Net -1095 ml   Filed Weights   12/29/17 0300 12/29/17 1200 12/30/17 2155  Weight: (!) 140.3 kg (309 lb 4.9 oz) 134 kg (295 lb 6.7 oz) 135.9 kg (299 lb 9.7  oz)    Examination:  General exam: Appears calm and comfortable  Respiratory system: Clear to auscultation. Respiratory effort normal. Cardiovascular system: S1 & S2 heard, RRR. No JVD, murmurs, rubs, gallops or clicks. No pedal edema. Gastrointestinal system: Abdomen is nondistended, soft and nontender. No organomegaly or masses felt. Normal bowel sounds heard. Central nervous system: Alert and oriented. No focal neurological deficits. Extremities: Symmetric 5 x 5 power. Skin: No rashes, lesions or ulcers Psychiatry: Judgement and insight appear normal. Mood & affect appropriate.     Data Reviewed: I have personally reviewed following labs and imaging studies  CBC: Recent Labs  Lab 12/28/17 2126 12/29/17 1715 12/30/17 0317 12/30/17 1517 12/31/17 0250 01/01/18 0219  WBC 6.9 6.1 5.5 5.8 4.7 4.4  NEUTROABS 5.0  --   --   --   --  2.2  HGB 12.4 11.4* 10.1* 10.3* 9.6* 9.9*  HCT 36.9 34.3* 30.5* 32.1* 29.4* 30.8*  MCV 96.3 99.4 97.4 97.9 98.7 98.4  PLT 161 124* 101* 77* 105* 811*   Basic Metabolic Panel: Recent Labs  Lab 12/29/17 0442 12/30/17 0317 12/30/17 1950 12/31/17 0250 01/01/18 0219  NA 139 137 141 137 137  K 3.6 2.9* 5.4* 3.5 3.8  CL 102 107 112* 107 105  CO2 26 23 18* 23 24  GLUCOSE 112* 104* 138* 109* 124*  BUN 11 6 7  <5* 7  CREATININE 0.90 0.72 0.80 0.69 0.79  CALCIUM 9.2  8.1* 9.1 8.7* 8.7*  MG 2.0 1.8  --   --   --   PHOS 2.2* 2.6  --   --   --    GFR: Estimated Creatinine Clearance: 89.9 mL/min (by C-G formula based on SCr of 0.79 mg/dL). Liver Function Tests: Recent Labs  Lab 12/28/17 2126  AST 33  ALT 11*  ALKPHOS 63  BILITOT 1.2  PROT 8.0  ALBUMIN 3.6   No results for input(s): LIPASE, AMYLASE in the last 168 hours. No results for input(s): AMMONIA in the last 168 hours. Coagulation Profile: Recent Labs  Lab 12/29/17 0937  INR 1.20   Cardiac Enzymes: No results for input(s): CKTOTAL, CKMB, CKMBINDEX, TROPONINI in the last 168  hours. BNP (last 3 results) No results for input(s): PROBNP in the last 8760 hours. HbA1C: No results for input(s): HGBA1C in the last 72 hours. CBG: Recent Labs  Lab 12/31/17 2154 12/31/17 2335 01/01/18 0354 01/01/18 0807 01/01/18 1202  GLUCAP 107* 97 110* 110* 95   Lipid Profile: No results for input(s): CHOL, HDL, LDLCALC, TRIG, CHOLHDL, LDLDIRECT in the last 72 hours. Thyroid Function Tests: No results for input(s): TSH, T4TOTAL, FREET4, T3FREE, THYROIDAB in the last 72 hours. Anemia Panel: No results for input(s): VITAMINB12, FOLATE, FERRITIN, TIBC, IRON, RETICCTPCT in the last 72 hours. Sepsis Labs: Recent Labs  Lab 12/28/17 2139 12/28/17 2330 12/29/17 1715  LATICACIDVEN 3.56* 2.56* 1.9    Recent Results (from the past 240 hour(s))  MRSA PCR Screening     Status: None   Collection Time: 12/29/17 12:42 PM  Result Value Ref Range Status   MRSA by PCR NEGATIVE NEGATIVE Final    Comment:        The GeneXpert MRSA Assay (FDA approved for NASAL specimens only), is one component of a comprehensive MRSA colonization surveillance program. It is not intended to diagnose MRSA infection nor to guide or monitor treatment for MRSA infections. Performed at [facility], [place] [address] [place], [place] [address]          Radiology Studies: No results found.      Scheduled Meds: . anastrozole  1 mg Oral Daily  . insulin aspart  0-15 Units Subcutaneous Q4H  . levothyroxine  150 mcg Oral QAC breakfast  . mouth rinse  15 mL Mouth Rinse BID  . pantoprazole  40 mg Oral Daily  . rivaroxaban  15 mg Oral BID   Followed by  . [START ON 01/20/2018] rivaroxaban  20 mg Oral Q supper  . sodium chloride flush  3 mL Intravenous Q12H   Continuous Infusions: . sodium chloride    . sodium chloride    . sodium chloride    . sodium chloride 10 mL/hr at 12/29/17 2230  . sodium chloride       LOS: 3 days     Georgette Shell, MD [facility] If  7PM-7AM, please contact night-coverage [email or web address] Password [facility] 01/01/2018, 12:33 PM

## 2018-01-01 NOTE — Plan of Care (Signed)
Patient encouraged to be out of bed and in chair.  Continuing to monitor decrease in Oxygen requirements, currently weaning oxygen and patient tolerating well.  No complaints of being short of breath.  Will continue to monitor closely

## 2018-01-02 ENCOUNTER — Other Ambulatory Visit: Payer: Self-pay | Admitting: Cardiology

## 2018-01-02 DIAGNOSIS — R001 Bradycardia, unspecified: Secondary | ICD-10-CM

## 2018-01-02 LAB — GLUCOSE, CAPILLARY
GLUCOSE-CAPILLARY: 105 mg/dL — AB (ref 65–99)
GLUCOSE-CAPILLARY: 101 mg/dL — AB (ref 65–99)
GLUCOSE-CAPILLARY: 116 mg/dL — AB (ref 65–99)
Glucose-Capillary: 125 mg/dL — ABNORMAL HIGH (ref 65–99)
Glucose-Capillary: 99 mg/dL (ref 65–99)

## 2018-01-02 LAB — CBC WITH DIFFERENTIAL/PLATELET
BASOS ABS: 0 10*3/uL (ref 0.0–0.1)
Basophils Relative: 1 %
Eosinophils Absolute: 0.2 10*3/uL (ref 0.0–0.7)
Eosinophils Relative: 6 %
HEMATOCRIT: 33.4 % — AB (ref 36.0–46.0)
HEMOGLOBIN: 11 g/dL — AB (ref 12.0–15.0)
LYMPHS PCT: 35 %
Lymphs Abs: 1.1 10*3/uL (ref 0.7–4.0)
MCH: 32.2 pg (ref 26.0–34.0)
MCHC: 32.9 g/dL (ref 30.0–36.0)
MCV: 97.7 fL (ref 78.0–100.0)
MONO ABS: 0.4 10*3/uL (ref 0.1–1.0)
MONOS PCT: 11 %
NEUTROS ABS: 1.5 10*3/uL — AB (ref 1.7–7.7)
Neutrophils Relative %: 47 %
Platelets: 129 10*3/uL — ABNORMAL LOW (ref 150–400)
RBC: 3.42 MIL/uL — ABNORMAL LOW (ref 3.87–5.11)
RDW: 13.4 % (ref 11.5–15.5)
WBC: 3.2 10*3/uL — ABNORMAL LOW (ref 4.0–10.5)

## 2018-01-02 LAB — BASIC METABOLIC PANEL
Anion gap: 8 (ref 5–15)
BUN: 5 mg/dL — ABNORMAL LOW (ref 6–20)
CO2: 24 mmol/L (ref 22–32)
CREATININE: 0.87 mg/dL (ref 0.44–1.00)
Calcium: 8.9 mg/dL (ref 8.9–10.3)
Chloride: 108 mmol/L (ref 101–111)
GFR calc Af Amer: >60 mL/min (ref >60)
GFR calc non Af Amer: >60 mL/min (ref >60)
GLUCOSE: 124 mg/dL — AB (ref 65–99)
Potassium: 3.8 mmol/L (ref 3.5–5.1)
Sodium: 140 mmol/L (ref 135–145)

## 2018-01-02 LAB — MAGNESIUM: Magnesium: 2.1 mg/dL (ref 1.7–2.4)

## 2018-01-02 MED ORDER — SODIUM CHLORIDE 0.9 % IV SOLN
INTRAVENOUS | Status: DC
  Administered 2018-01-02: 1000 mL via INTRAVENOUS
  Administered 2018-01-03: 07:00:00 via INTRAVENOUS

## 2018-01-02 NOTE — Progress Notes (Signed)
On assessment, pt has a large hematoma on R forearm almost completely circumferential. On-call provider notified. Will continue to monitor.  Clint Bolder, RN 01/02/18 7:56 PM

## 2018-01-02 NOTE — Care Management Important Message (Signed)
Important Message  Patient Details  Name: Sheila Jacobs MRN: [id number] Date of Birth: 1950-10-31   Medicare Important Message Given:  Yes    Orbie Pyo 01/02/2018, 1:11 PM

## 2018-01-02 NOTE — Consult Note (Signed)
Cardiology Consult    Patient ID: REI CONTEE MRN: [id number], DOB/AGE: 1951-06-22   Admit date: 12/28/2017 Date of Consult: 01/02/2018  Primary Physician: Velna Hatchet, MD Primary Cardiologist: Dr. [name] Requesting Provider: Dr. Rodena Piety Reason for Consultation: Bradycardia  Sheila Jacobs is a 67 y.o. female who is being seen today for the evaluation of bradycardia at the request of No ref. provider found.   Patient Profile    67 yo female with PMH of obesity, HTN, HL, tobacco use, L sided Breast Ca who presented with progressive dyspnea and found to have bilateral saddle PE. Noted to have asymptomatic bradycardia and brief pause on telemetry.   Past Medical History   Past Medical History:  Diagnosis Date  . Anginal pain ([place])   . Anxiety   . Arthritis   . Breast cancer ([place])   . Breast cancer of upper-outer quadrant of left female breast ([place]) 01/31/2016  . Colon polyps   . Enlarged LA (left atrium)   . GERD (gastroesophageal reflux disease)   . Hard of hearing    Wears hearing aids.  . Headache   . Heavy cigarette smoker    Quit 2015  . History of radiation therapy 06/18/16- 07/30/16   Left Breast 45 Gy in 25 fractions, Left Breast Boost 6 Gy in 3 fractions.   . Hyperlipidemia   . Hypertension   . Morbid obesity ([place])   . Numbness and tingling   . Obesity   . Thyroid goiter   . Vertigo    on occasion    Past Surgical History:  Procedure Laterality Date  . BREAST LUMPECTOMY Left 2017   radiation  . BREAST LUMPECTOMY WITH RADIOACTIVE SEED AND SENTINEL LYMPH NODE BIOPSY Left 04/11/2016   Procedure: BREAST LUMPECTOMY WITH RADIOACTIVE SEED AND SENTINEL LYMPH NODE BIOPSY;  Surgeon: Excell Seltzer, MD;  Location: [facility];  Service: General;  Laterality: Left;  . COLONOSCOPY WITH PROPOFOL N/A 10/20/2015   Procedure: COLONOSCOPY WITH PROPOFOL;  Surgeon: Mauri Pole, MD;  Location: [facility];  Service: Endoscopy;  Laterality: N/A;  .  ESOPHAGOGASTRODUODENOSCOPY (EGD) WITH PROPOFOL N/A 10/20/2015   Procedure: ESOPHAGOGASTRODUODENOSCOPY (EGD) WITH PROPOFOL;  Surgeon: Mauri Pole, MD;  Location: [facility];  Service: Endoscopy;  Laterality: N/A;  . IR ANGIOGRAM PULMONARY BILATERAL SELECTIVE  12/29/2017  . IR ANGIOGRAM SELECTIVE EACH ADDITIONAL VESSEL  12/29/2017  . IR ANGIOGRAM SELECTIVE EACH ADDITIONAL VESSEL  12/29/2017  . IR INFUSION THROMBOL ARTERIAL INITIAL (MS)  12/29/2017  . IR INFUSION THROMBOL ARTERIAL INITIAL (MS)  12/29/2017  . IR THROMB F/U EVAL ART/VEN FINAL DAY (MS)  12/30/2017  . IR US GUIDE VASC ACCESS RIGHT  12/29/2017  . LEFT HEART CATHETERIZATION WITH CORONARY ANGIOGRAM N/A 10/21/2014   Procedure: LEFT HEART CATHETERIZATION WITH CORONARY ANGIOGRAM;  Surgeon: Burnell Blanks, MD;  Location: [facility] CATH LAB;  Service: Cardiovascular;  Laterality: N/A;  . THYROIDECTOMY N/A 02/26/2017   Procedure: TOTAL THYROIDECTOMY;  Surgeon: Excell Seltzer, MD;  Location: [facility];  Service: General;  Laterality: N/A;  . TUBAL LIGATION       Allergies  Allergies  Allergen Reactions  . Cortisone Rash  . Effexor [Venlafaxine] Palpitations  . Lexapro [Escitalopram] Palpitations    Heart racing    History of Present Illness    Mrs. Sheila Jacobs is a 67 yo female with PMH of obesity, HTN, HL, tobacco use, L sided Breast Ca treated with radiation. She is followed by Dr. [name] as an outpatient. Had a high risk  Nuclear stress test back in 2016 with large reversible inferior defect and was referred to [facility]. This was done by Dr. Angelena Form and found to have no evidence of CAD with normal LV function.   She presented to the ED on 12/29/17 with 2 weeks of progressive shortness of breath. Found to have D-dimer >20 with CTA showing bilateral PE with evidence of right heart strain consistent with submassive PE. Echo showed normal EF with RV dilated and hypokinetic. Underwent lysis by IR on 12/29/17. Was still O2 dependent. Placed on  heparin with transition to Xarelto. EKG on admission showed ST. She transferred from the unit. There was concern of bradycardia this morning. In review of telemetry she was noted in the 30-40s around 2am while at rest. Also had a brief 2.6 sec pause noted. Patient was asymptomatic during this. No further episodes. Follow up EKG today showed SR with TWI in v1-v4 suspect related to right heart strain on admission. No chest pain per her report.   Inpatient Medications    . anastrozole  1 mg Oral Daily  . insulin aspart  0-15 Units Subcutaneous Q4H  . levothyroxine  150 mcg Oral QAC breakfast  . mouth rinse  15 mL Mouth Rinse BID  . pantoprazole  40 mg Oral Daily  . rivaroxaban  15 mg Oral BID   Followed by  . [START ON 01/20/2018] rivaroxaban  20 mg Oral Q supper  . sodium chloride flush  3 mL Intravenous Q12H    Family History    Family History  Problem Relation Age of Onset  . Hypertension Mother   . Heart disease Mother        Pacemaker  . Kidney disease Mother   . Cataracts Mother   . Hearing loss Mother   . Diabetes Mother   . Liver disease Mother   . Diabetes Sister   . Cataracts Brother   . Hearing loss Brother   . Colon cancer Neg Hx   . Thyroid disease Neg Hx     Social History    Social History   Socioeconomic History  . Marital status: Single    Spouse name: Not on file  . Number of children: 1  . Years of education: 75  . Highest education level: Not on file  Occupational History  . Occupation: Retired  Scientific laboratory technician  . Financial resource strain: Not on file  . Food insecurity:    Worry: Not on file    Inability: Not on file  . Transportation needs:    Medical: Not on file    Non-medical: Not on file  Tobacco Use  . Smoking status: Former Smoker    Packs/day: 1.00    Years: 45.00    Pack years: 45.00    Types: Cigarettes    Last attempt to quit: 07/11/2014    Years since quitting: 3.4  . Smokeless tobacco: Never Used  Substance and Sexual Activity    . Alcohol use: No    Alcohol/week: 0.0 oz  . Drug use: No  . Sexual activity: Not on file  Lifestyle  . Physical activity:    Days per week: Not on file    Minutes per session: Not on file  . Stress: Not on file  Relationships  . Social connections:    Talks on phone: Not on file    Gets together: Not on file    Attends religious service: Not on file    Active member of club or organization: Not  on file    Attends meetings of clubs or organizations: Not on file    Relationship status: Not on file  . Intimate partner violence:    Fear of current or ex partner: Not on file    Emotionally abused: Not on file    Physically abused: Not on file    Forced sexual activity: Not on file  Other Topics Concern  . Not on file  Social History Narrative   Lives with daughter.    Right-hand.   Occasional use of caffeine.      Review of Systems    See HPI  All other systems reviewed and are otherwise negative except as noted above.  Physical Exam    Blood pressure 105/88, pulse 73, temperature 98.2 F (36.8 C), temperature source Oral, resp. rate (!) 23, height 5\' 3"  (1.6 m), weight (!) 300 lb 14.4 oz (136.5 kg), SpO2 99 %.  General: Pleasant, obese older AAF, NAD Psych: Normal affect. Neuro: Alert and oriented X 3. Moves all extremities spontaneously. HEENT: Normal  Neck: Supple, thick neck. Lungs:  Resp regular and unlabored, CTA. Heart: RRR no s3, s4, or murmurs. Abdomen: Soft, non-tender, non-distended, BS + x 4.  Extremities: No clubbing, cyanosis or edema. DP/PT/Radials 2+ and equal bilaterally.  Labs    Troponin (Point of Care Test) No results for input(s): TROPIPOC in the last 72 hours. No results for input(s): CKTOTAL, CKMB, TROPONINI in the last 72 hours. Lab Results  Component Value Date   WBC 4.4 01/01/2018   HGB 9.9 (L) 01/01/2018   HCT 30.8 (L) 01/01/2018   MCV 98.4 01/01/2018   PLT 131 (L) 01/01/2018    Recent Labs  Lab 12/28/17 2126  01/01/18 0219   NA 137   < > 137  K 3.0*   < > 3.8  CL 100*   < > 105  CO2 22   < > 24  BUN 11   < > 7  CREATININE 1.07*   < > 0.79  CALCIUM 9.4   < > 8.7*  PROT 8.0  --   --   BILITOT 1.2  --   --   ALKPHOS 63  --   --   ALT 11*  --   --   AST 33  --   --   GLUCOSE 183*   < > 124*   < > = values in this interval not displayed.   Lab Results  Component Value Date   CHOL 168 08/09/2016   HDL 65 08/09/2016   LDLCALC 95 08/09/2016   TRIG 39 08/09/2016   Lab Results  Component Value Date   DDIMER >20.00 (H) 12/28/2017     Radiology Studies    Dg Chest 2 View  Result Date: 12/28/2017 CLINICAL DATA:  Increasing shortness of breath EXAM: CHEST - 2 VIEW COMPARISON:  10/06/2017 FINDINGS: Cardiac shadow is stable. Aortic calcifications are again seen. Mild increased vascular congestion is noted without interstitial edema. The lungs are well aerated bilaterally. No focal infiltrate or sizable effusion is seen. Mild degenerative changes of the thoracic spine are noted. IMPRESSION: Mild increased vascular congestion without edema. No other focal abnormality is seen. Electronically Signed   By: Inez Catalina M.D.   On: 12/28/2017 22:01   Ct Angio Chest Pe W And/or Wo Contrast  Result Date: 12/29/2017 CLINICAL DATA:  Shortness of breath and chest pain EXAM: CT ANGIOGRAPHY CHEST WITH CONTRAST TECHNIQUE: Multidetector CT imaging of the chest was performed using the  standard protocol during bolus administration of intravenous contrast. Multiplanar CT image reconstructions and MIPs were obtained to evaluate the vascular anatomy. CONTRAST:  149mL ISOVUE-370 IOPAMIDOL (ISOVUE-370) INJECTION 76% COMPARISON:  Chest radiograph 12/28/2017 FINDINGS: Cardiovascular: Contrast injection is sufficient to demonstrate satisfactory opacification of the pulmonary arteries to the segmental level.There is large volume pulmonary embolus within both distal main pulmonary arteries and extending into the lobar and segmental branches.  There is evidence of right heart strain with an RV/LV ratio of approximately 3. The main pulmonary artery is enlarged, measuring 4 cm at the bifurcation. There is a normal 3-vessel arch branching pattern without evidence of acute aortic syndrome. There is mildaortic atherosclerosis. Heart size is normal, without pericardial effusion. Mediastinum/Nodes: No mediastinal, hilar or axillary lymphadenopathy. The visualized thyroid and thoracic esophageal course are unremarkable. Lungs/Pleura: No pulmonary nodules or masses. No pleural effusion or pneumothorax. No focal airspace consolidation. No focal pleural abnormality. Upper Abdomen: Contrast bolus timing is not optimized for evaluation of the abdominal organs. Within this limitation, the visualized organs of the upper abdomen are normal. Musculoskeletal: No chest wall abnormality. No acute or significant osseous findings. Review of the MIP images confirms the above findings. IMPRESSION: 1. Positive for acute bilateral main pulmonary artery embolus with CT evidence of right heart strain (RV/LV Ratio = 3), consistent with at least submassive (intermediate risk) PE. Large volume emboli extend into the lobar and segmental arteries of both lungs. The presence of right heart strain has been associated with an increased risk of morbidity and mortality. Please activate Code PE by paging [phone]. 2.  Aortic Atherosclerosis (ICD10-I70.0). Critical Value/emergent results were called by telephone at the time of interpretation on 12/29/2017 at 3:07 am to [facility] , who verbally acknowledged these results. Electronically Signed   By: Ulyses Jarred M.D.   On: 12/29/2017 03:10   Ir Angiogram Pulmonary Bilateral Selective  Result Date: 12/29/2017 INDICATION: 67 year old female with acute large volume bilateral pulmonary emboli and associated cor pulmonale (submassive/intermediate risk pulmonary embolus). She presents for catheter directed bilateral pulmonary arterial  thrombolysis. EXAM: BILATERAL PULMONARY ARTERIOGRAPHY; IR INFUSION THROMBOL ARTERIAL INITIAL (MS); IR ULTRASOUND GUIDANCE VASC ACCESS RIGHT; ADDITIONAL ARTERIOGRAPHY 1. Ultrasound-guided access right common femoral vein 2. Second ultrasound-guided access right common femoral vein 3. Catheterization of the main pulmonary artery with main pulmonary arteriogram and pressure measurements 4. Catheterization of the left pulmonary artery with arteriogram 5. Placement of a left lower lobe pulmonary artery multi sidehole ultrasound assisted infusion catheter 6. Catheterization of the right pulmonary artery with arteriogram 7. Placement of a right lower lobe pulmonary artery multi sidehole ultrasound assisted infusion catheter 8. Initiation of bilateral pulmonary arterial thrombolysis COMPARISON:  CT PE study 12/29/2017 MEDICATIONS: None. ANESTHESIA/SEDATION: Versed 1 mg IV; Fentanyl 50 mcg IV Moderate Sedation Time:  39 minutes The patient was continuously monitored during the procedure by the interventional radiology nurse under my direct supervision. FLUOROSCOPY TIME:  Fluoroscopy Time: 11 minutes 0 seconds (122 mGy). COMPLICATIONS: None immediate. TECHNIQUE: Informed written consent was obtained from the patient after a thorough discussion of the procedural risks, benefits and alternatives. All questions were addressed. Maximal Sterile Barrier Technique was utilized including caps, mask, sterile gowns, sterile gloves, sterile drape, hand hygiene and skin antiseptic. A timeout was performed prior to the initiation of the procedure. The right was interrogated with ultrasound and found to be widely patent. An image was obtained and stored for the medical record. Local anesthesia was attained by infiltration with 1% lidocaine. A small dermatotomy was  made. Under real-time sonographic guidance, the vessel was punctured with a 21 gauge micropuncture needle. Using standard technique, the initial micro needle was exchanged over a  0.018 micro wire for a transitional 4 [place] micro sheath. The micro sheath was then exchanged over a 0.035 wire for a 6 French vascular sheath. Using the same technique, a second 6 [place] vascular sheath was also placed into the common femoral vein just lateral to the first. Working through the more medial sheath, a C2 cobra catheter was advanced over a Bentson wire in used to select the main pulmonary artery. A main pulmonary arteriogram was performed. There is massive enlargement of the pulmonary trunk and central left and right pulmonary arteries with very poor opacification of the lobar pulmonary arteries bilaterally secondary to acute thrombus. The pulmonary arterial pressure was obtained at 53/25 (35) mm Hg. This is consistent with pulmonary arterial hypertension. The C2 cobra catheter was then navigated into the left main pulmonary artery. A left pulmonary arteriogram was performed. Occlusive thrombus is present throughout the left lower and upper lobe pulmonary arteries. Using a glidewire, the catheter was successfully navigated into the distal aspect of the left lower lobe pulmonary artery. The glidewire was then exchanged for a Rosen wire. The 5 French catheter was removed and a 12 cm infusion length EKOS multi sidehole ultrasound assisted infusion catheter was advanced over the wire and positioned in the left lower lobe pulmonary artery. The C2 cobra catheter was then advanced through the more lateral sheath and navigated into the right main pulmonary artery. A right main pulmonary arteriogram was performed again demonstrating occlusive thrombus within the right upper, middle and lower lobe pulmonary arteries. The catheter was successfully navigated over a Glidewire into the right lower lobe pulmonary artery. The glidewire was then exchanged for a rose in wire. The 5 French catheter was removed and a second 12 cm infusion length EKOS multi sidehole ultrasound assisted infusion catheter was advanced over  the wire and positioned in the right lower lobe pulmonary artery. The infusion catheters were connected to tPA and bilateral pulmonary artery arterial thrombolysis was initiated at a rate of 1 milligram/hour per catheter for a total of 2 milligrams/hour. The sheaths were secured with 0 Prolene suture. The patient tolerated the procedure well. FINDINGS: Main PAP: 53/25 (35) mm Hg IMPRESSION: 1. Suspect acute on chronic pulmonary artery hypertension. The pulmonary trunk and main pulmonary arteries are massive suggesting underlying chronic pulmonary arterial hypertension. 2. Occlusive or near occlusive thrombus present within the lobar arteries of all lobes of both lungs. 3. Successful initiation of bilateral pulmonary artery thrombolysis using ultrasound assisted multi side-hole infusion catheters (EKOS). Signed, Criselda Peaches, MD [facility] Radiology Electronically Signed   By: Jacqulynn Cadet M.D.   On: 12/29/2017 11:37   Ir Angiogram Selective Each Additional Vessel  Result Date: 12/29/2017 INDICATION: 67 year old female with acute large volume bilateral pulmonary emboli and associated cor pulmonale (submassive/intermediate risk pulmonary embolus). She presents for catheter directed bilateral pulmonary arterial thrombolysis. EXAM: BILATERAL PULMONARY ARTERIOGRAPHY; IR INFUSION THROMBOL ARTERIAL INITIAL (MS); IR ULTRASOUND GUIDANCE VASC ACCESS RIGHT; ADDITIONAL ARTERIOGRAPHY 1. Ultrasound-guided access right common femoral vein 2. Second ultrasound-guided access right common femoral vein 3. Catheterization of the main pulmonary artery with main pulmonary arteriogram and pressure measurements 4. Catheterization of the left pulmonary artery with arteriogram 5. Placement of a left lower lobe pulmonary artery multi sidehole ultrasound assisted infusion catheter 6. Catheterization of the right pulmonary artery with arteriogram 7.  Placement of a right lower  lobe pulmonary artery multi sidehole ultrasound assisted infusion catheter 8. Initiation of bilateral pulmonary arterial thrombolysis COMPARISON:  CT PE study 12/29/2017 MEDICATIONS: None. ANESTHESIA/SEDATION: Versed 1 mg IV; Fentanyl 50 mcg IV Moderate Sedation Time:  39 minutes The patient was continuously monitored during the procedure by the interventional radiology nurse under my direct supervision. FLUOROSCOPY TIME:  Fluoroscopy Time: 11 minutes 0 seconds (122 mGy). COMPLICATIONS: None immediate. TECHNIQUE: Informed written consent was obtained from the patient after a thorough discussion of the procedural risks, benefits and alternatives. All questions were addressed. Maximal Sterile Barrier Technique was utilized including caps, mask, sterile gowns, sterile gloves, sterile drape, hand hygiene and skin antiseptic. A timeout was performed prior to the initiation of the procedure. The right was interrogated with ultrasound and found to be widely patent. An image was obtained and stored for the medical record. Local anesthesia was attained by infiltration with 1% lidocaine. A small dermatotomy was made. Under real-time sonographic guidance, the vessel was punctured with a 21 gauge micropuncture needle. Using standard technique, the initial micro needle was exchanged over a 0.018 micro wire for a transitional 4 [place] micro sheath. The micro sheath was then exchanged over a 0.035 wire for a 6 French vascular sheath. Using the same technique, a second 6 [place] vascular sheath was also placed into the common femoral vein just lateral to the first. Working through the more medial sheath, a C2 cobra catheter was advanced over a Bentson wire in used to select the main pulmonary artery. A main pulmonary arteriogram was performed. There is massive enlargement of the pulmonary trunk and central left and right pulmonary arteries with very poor opacification of the lobar pulmonary arteries bilaterally secondary to acute  thrombus. The pulmonary arterial pressure was obtained at 53/25 (35) mm Hg. This is consistent with pulmonary arterial hypertension. The C2 cobra catheter was then navigated into the left main pulmonary artery. A left pulmonary arteriogram was performed. Occlusive thrombus is present throughout the left lower and upper lobe pulmonary arteries. Using a glidewire, the catheter was successfully navigated into the distal aspect of the left lower lobe pulmonary artery. The glidewire was then exchanged for a Rosen wire. The 5 French catheter was removed and a 12 cm infusion length EKOS multi sidehole ultrasound assisted infusion catheter was advanced over the wire and positioned in the left lower lobe pulmonary artery. The C2 cobra catheter was then advanced through the more lateral sheath and navigated into the right main pulmonary artery. A right main pulmonary arteriogram was performed again demonstrating occlusive thrombus within the right upper, middle and lower lobe pulmonary arteries. The catheter was successfully navigated over a Glidewire into the right lower lobe pulmonary artery. The glidewire was then exchanged for a rose in wire. The 5 French catheter was removed and a second 12 cm infusion length EKOS multi sidehole ultrasound assisted infusion catheter was advanced over the wire and positioned in the right lower lobe pulmonary artery. The infusion catheters were connected to tPA and bilateral pulmonary artery arterial thrombolysis was initiated at a rate of 1 milligram/hour per catheter for a total of 2 milligrams/hour. The sheaths were secured with 0 Prolene suture. The patient tolerated the procedure well. FINDINGS: Main PAP: 53/25 (35) mm Hg IMPRESSION: 1. Suspect acute on chronic pulmonary artery hypertension. The pulmonary trunk and main pulmonary arteries are massive suggesting underlying chronic pulmonary arterial hypertension. 2. Occlusive or near occlusive thrombus present within the lobar arteries  of all  lobes of both lungs. 3. Successful initiation of bilateral pulmonary artery thrombolysis using ultrasound assisted multi side-hole infusion catheters (EKOS). Signed, Criselda Peaches, MD [facility] Radiology Electronically Signed   By: Jacqulynn Cadet M.D.   On: 12/29/2017 11:37   Ir Angiogram Selective Each Additional Vessel  Result Date: 12/29/2017 INDICATION: 67 year old female with acute large volume bilateral pulmonary emboli and associated cor pulmonale (submassive/intermediate risk pulmonary embolus). She presents for catheter directed bilateral pulmonary arterial thrombolysis. EXAM: BILATERAL PULMONARY ARTERIOGRAPHY; IR INFUSION THROMBOL ARTERIAL INITIAL (MS); IR ULTRASOUND GUIDANCE VASC ACCESS RIGHT; ADDITIONAL ARTERIOGRAPHY 1. Ultrasound-guided access right common femoral vein 2. Second ultrasound-guided access right common femoral vein 3. Catheterization of the main pulmonary artery with main pulmonary arteriogram and pressure measurements 4. Catheterization of the left pulmonary artery with arteriogram 5. Placement of a left lower lobe pulmonary artery multi sidehole ultrasound assisted infusion catheter 6. Catheterization of the right pulmonary artery with arteriogram 7. Placement of a right lower lobe pulmonary artery multi sidehole ultrasound assisted infusion catheter 8. Initiation of bilateral pulmonary arterial thrombolysis COMPARISON:  CT PE study 12/29/2017 MEDICATIONS: None. ANESTHESIA/SEDATION: Versed 1 mg IV; Fentanyl 50 mcg IV Moderate Sedation Time:  39 minutes The patient was continuously monitored during the procedure by the interventional radiology nurse under my direct supervision. FLUOROSCOPY TIME:  Fluoroscopy Time: 11 minutes 0 seconds (122 mGy). COMPLICATIONS: None immediate. TECHNIQUE: Informed written consent was obtained from the patient after a thorough discussion of the procedural risks, benefits and alternatives.  All questions were addressed. Maximal Sterile Barrier Technique was utilized including caps, mask, sterile gowns, sterile gloves, sterile drape, hand hygiene and skin antiseptic. A timeout was performed prior to the initiation of the procedure. The right was interrogated with ultrasound and found to be widely patent. An image was obtained and stored for the medical record. Local anesthesia was attained by infiltration with 1% lidocaine. A small dermatotomy was made. Under real-time sonographic guidance, the vessel was punctured with a 21 gauge micropuncture needle. Using standard technique, the initial micro needle was exchanged over a 0.018 micro wire for a transitional 4 [place] micro sheath. The micro sheath was then exchanged over a 0.035 wire for a 6 French vascular sheath. Using the same technique, a second 6 [place] vascular sheath was also placed into the common femoral vein just lateral to the first. Working through the more medial sheath, a C2 cobra catheter was advanced over a Bentson wire in used to select the main pulmonary artery. A main pulmonary arteriogram was performed. There is massive enlargement of the pulmonary trunk and central left and right pulmonary arteries with very poor opacification of the lobar pulmonary arteries bilaterally secondary to acute thrombus. The pulmonary arterial pressure was obtained at 53/25 (35) mm Hg. This is consistent with pulmonary arterial hypertension. The C2 cobra catheter was then navigated into the left main pulmonary artery. A left pulmonary arteriogram was performed. Occlusive thrombus is present throughout the left lower and upper lobe pulmonary arteries. Using a glidewire, the catheter was successfully navigated into the distal aspect of the left lower lobe pulmonary artery. The glidewire was then exchanged for a Rosen wire. The 5 French catheter was removed and a 12 cm infusion length EKOS multi sidehole ultrasound assisted infusion catheter was advanced  over the wire and positioned in the left lower lobe pulmonary artery. The C2 cobra catheter was then advanced through the more lateral sheath and navigated into the right main pulmonary artery. A right  main pulmonary arteriogram was performed again demonstrating occlusive thrombus within the right upper, middle and lower lobe pulmonary arteries. The catheter was successfully navigated over a Glidewire into the right lower lobe pulmonary artery. The glidewire was then exchanged for a rose in wire. The 5 French catheter was removed and a second 12 cm infusion length EKOS multi sidehole ultrasound assisted infusion catheter was advanced over the wire and positioned in the right lower lobe pulmonary artery. The infusion catheters were connected to tPA and bilateral pulmonary artery arterial thrombolysis was initiated at a rate of 1 milligram/hour per catheter for a total of 2 milligrams/hour. The sheaths were secured with 0 Prolene suture. The patient tolerated the procedure well. FINDINGS: Main PAP: 53/25 (35) mm Hg IMPRESSION: 1. Suspect acute on chronic pulmonary artery hypertension. The pulmonary trunk and main pulmonary arteries are massive suggesting underlying chronic pulmonary arterial hypertension. 2. Occlusive or near occlusive thrombus present within the lobar arteries of all lobes of both lungs. 3. Successful initiation of bilateral pulmonary artery thrombolysis using ultrasound assisted multi side-hole infusion catheters (EKOS). Signed, Criselda Peaches, MD [facility] Radiology Electronically Signed   By: Jacqulynn Cadet M.D.   On: 12/29/2017 11:37   Ir US Guide Vasc Access Right  Result Date: 12/29/2017 INDICATION: 66 year old female with acute large volume bilateral pulmonary emboli and associated cor pulmonale (submassive/intermediate risk pulmonary embolus). She presents for catheter directed bilateral pulmonary arterial thrombolysis. EXAM:  BILATERAL PULMONARY ARTERIOGRAPHY; IR INFUSION THROMBOL ARTERIAL INITIAL (MS); IR ULTRASOUND GUIDANCE VASC ACCESS RIGHT; ADDITIONAL ARTERIOGRAPHY 1. Ultrasound-guided access right common femoral vein 2. Second ultrasound-guided access right common femoral vein 3. Catheterization of the main pulmonary artery with main pulmonary arteriogram and pressure measurements 4. Catheterization of the left pulmonary artery with arteriogram 5. Placement of a left lower lobe pulmonary artery multi sidehole ultrasound assisted infusion catheter 6. Catheterization of the right pulmonary artery with arteriogram 7. Placement of a right lower lobe pulmonary artery multi sidehole ultrasound assisted infusion catheter 8. Initiation of bilateral pulmonary arterial thrombolysis COMPARISON:  CT PE study 12/29/2017 MEDICATIONS: None. ANESTHESIA/SEDATION: Versed 1 mg IV; Fentanyl 50 mcg IV Moderate Sedation Time:  39 minutes The patient was continuously monitored during the procedure by the interventional radiology nurse under my direct supervision. FLUOROSCOPY TIME:  Fluoroscopy Time: 11 minutes 0 seconds (122 mGy). COMPLICATIONS: None immediate. TECHNIQUE: Informed written consent was obtained from the patient after a thorough discussion of the procedural risks, benefits and alternatives. All questions were addressed. Maximal Sterile Barrier Technique was utilized including caps, mask, sterile gowns, sterile gloves, sterile drape, hand hygiene and skin antiseptic. A timeout was performed prior to the initiation of the procedure. The right was interrogated with ultrasound and found to be widely patent. An image was obtained and stored for the medical record. Local anesthesia was attained by infiltration with 1% lidocaine. A small dermatotomy was made. Under real-time sonographic guidance, the vessel was punctured with a 21 gauge micropuncture needle. Using standard technique, the initial micro needle was exchanged over a 0.018 micro wire  for a transitional 4 [place] micro sheath. The micro sheath was then exchanged over a 0.035 wire for a 6 French vascular sheath. Using the same technique, a second 6 [place] vascular sheath was also placed into the common femoral vein just lateral to the first. Working through the more medial sheath, a C2 cobra catheter was advanced over a Bentson wire in used to select the main pulmonary artery. A main pulmonary  arteriogram was performed. There is massive enlargement of the pulmonary trunk and central left and right pulmonary arteries with very poor opacification of the lobar pulmonary arteries bilaterally secondary to acute thrombus. The pulmonary arterial pressure was obtained at 53/25 (35) mm Hg. This is consistent with pulmonary arterial hypertension. The C2 cobra catheter was then navigated into the left main pulmonary artery. A left pulmonary arteriogram was performed. Occlusive thrombus is present throughout the left lower and upper lobe pulmonary arteries. Using a glidewire, the catheter was successfully navigated into the distal aspect of the left lower lobe pulmonary artery. The glidewire was then exchanged for a Rosen wire. The 5 French catheter was removed and a 12 cm infusion length EKOS multi sidehole ultrasound assisted infusion catheter was advanced over the wire and positioned in the left lower lobe pulmonary artery. The C2 cobra catheter was then advanced through the more lateral sheath and navigated into the right main pulmonary artery. A right main pulmonary arteriogram was performed again demonstrating occlusive thrombus within the right upper, middle and lower lobe pulmonary arteries. The catheter was successfully navigated over a Glidewire into the right lower lobe pulmonary artery. The glidewire was then exchanged for a rose in wire. The 5 French catheter was removed and a second 12 cm infusion length EKOS multi sidehole ultrasound assisted infusion catheter was advanced over the wire and  positioned in the right lower lobe pulmonary artery. The infusion catheters were connected to tPA and bilateral pulmonary artery arterial thrombolysis was initiated at a rate of 1 milligram/hour per catheter for a total of 2 milligrams/hour. The sheaths were secured with 0 Prolene suture. The patient tolerated the procedure well. FINDINGS: Main PAP: 53/25 (35) mm Hg IMPRESSION: 1. Suspect acute on chronic pulmonary artery hypertension. The pulmonary trunk and main pulmonary arteries are massive suggesting underlying chronic pulmonary arterial hypertension. 2. Occlusive or near occlusive thrombus present within the lobar arteries of all lobes of both lungs. 3. Successful initiation of bilateral pulmonary artery thrombolysis using ultrasound assisted multi side-hole infusion catheters (EKOS). Signed, Criselda Peaches, MD [facility] Radiology Electronically Signed   By: Jacqulynn Cadet M.D.   On: 12/29/2017 11:37   Ir Infusion Thrombol Arterial Initial (ms)  Result Date: 12/29/2017 INDICATION: 67 year old female with acute large volume bilateral pulmonary emboli and associated cor pulmonale (submassive/intermediate risk pulmonary embolus). She presents for catheter directed bilateral pulmonary arterial thrombolysis. EXAM: BILATERAL PULMONARY ARTERIOGRAPHY; IR INFUSION THROMBOL ARTERIAL INITIAL (MS); IR ULTRASOUND GUIDANCE VASC ACCESS RIGHT; ADDITIONAL ARTERIOGRAPHY 1. Ultrasound-guided access right common femoral vein 2. Second ultrasound-guided access right common femoral vein 3. Catheterization of the main pulmonary artery with main pulmonary arteriogram and pressure measurements 4. Catheterization of the left pulmonary artery with arteriogram 5. Placement of a left lower lobe pulmonary artery multi sidehole ultrasound assisted infusion catheter 6. Catheterization of the right pulmonary artery with arteriogram 7. Placement of a right lower lobe pulmonary  artery multi sidehole ultrasound assisted infusion catheter 8. Initiation of bilateral pulmonary arterial thrombolysis COMPARISON:  CT PE study 12/29/2017 MEDICATIONS: None. ANESTHESIA/SEDATION: Versed 1 mg IV; Fentanyl 50 mcg IV Moderate Sedation Time:  39 minutes The patient was continuously monitored during the procedure by the interventional radiology nurse under my direct supervision. FLUOROSCOPY TIME:  Fluoroscopy Time: 11 minutes 0 seconds (122 mGy). COMPLICATIONS: None immediate. TECHNIQUE: Informed written consent was obtained from the patient after a thorough discussion of the procedural risks, benefits and alternatives. All questions were addressed. Maximal Sterile Barrier  Technique was utilized including caps, mask, sterile gowns, sterile gloves, sterile drape, hand hygiene and skin antiseptic. A timeout was performed prior to the initiation of the procedure. The right was interrogated with ultrasound and found to be widely patent. An image was obtained and stored for the medical record. Local anesthesia was attained by infiltration with 1% lidocaine. A small dermatotomy was made. Under real-time sonographic guidance, the vessel was punctured with a 21 gauge micropuncture needle. Using standard technique, the initial micro needle was exchanged over a 0.018 micro wire for a transitional 4 [place] micro sheath. The micro sheath was then exchanged over a 0.035 wire for a 6 French vascular sheath. Using the same technique, a second 6 [place] vascular sheath was also placed into the common femoral vein just lateral to the first. Working through the more medial sheath, a C2 cobra catheter was advanced over a Bentson wire in used to select the main pulmonary artery. A main pulmonary arteriogram was performed. There is massive enlargement of the pulmonary trunk and central left and right pulmonary arteries with very poor opacification of the lobar pulmonary arteries bilaterally secondary to acute thrombus. The  pulmonary arterial pressure was obtained at 53/25 (35) mm Hg. This is consistent with pulmonary arterial hypertension. The C2 cobra catheter was then navigated into the left main pulmonary artery. A left pulmonary arteriogram was performed. Occlusive thrombus is present throughout the left lower and upper lobe pulmonary arteries. Using a glidewire, the catheter was successfully navigated into the distal aspect of the left lower lobe pulmonary artery. The glidewire was then exchanged for a Rosen wire. The 5 French catheter was removed and a 12 cm infusion length EKOS multi sidehole ultrasound assisted infusion catheter was advanced over the wire and positioned in the left lower lobe pulmonary artery. The C2 cobra catheter was then advanced through the more lateral sheath and navigated into the right main pulmonary artery. A right main pulmonary arteriogram was performed again demonstrating occlusive thrombus within the right upper, middle and lower lobe pulmonary arteries. The catheter was successfully navigated over a Glidewire into the right lower lobe pulmonary artery. The glidewire was then exchanged for a rose in wire. The 5 French catheter was removed and a second 12 cm infusion length EKOS multi sidehole ultrasound assisted infusion catheter was advanced over the wire and positioned in the right lower lobe pulmonary artery. The infusion catheters were connected to tPA and bilateral pulmonary artery arterial thrombolysis was initiated at a rate of 1 milligram/hour per catheter for a total of 2 milligrams/hour. The sheaths were secured with 0 Prolene suture. The patient tolerated the procedure well. FINDINGS: Main PAP: 53/25 (35) mm Hg IMPRESSION: 1. Suspect acute on chronic pulmonary artery hypertension. The pulmonary trunk and main pulmonary arteries are massive suggesting underlying chronic pulmonary arterial hypertension. 2. Occlusive or near occlusive thrombus present within the lobar arteries of all lobes  of both lungs. 3. Successful initiation of bilateral pulmonary artery thrombolysis using ultrasound assisted multi side-hole infusion catheters (EKOS). Signed, Criselda Peaches, MD [facility] Inter[facility] Radiology Special[facility] Radiology Electronically Signed   By: Jacqulynn Cadet M.D.   On: 12/29/2017 11:37   Ir Infusion Thrombol Arterial Initial (ms)  Result Date: 12/29/2017 INDICATION: 67 year old female with acute large volume bilateral pulmonary emboli and associated cor pulmonale (submassive/intermediate risk pulmonary embolus). She presents for catheter directed bilateral pulmonary arterial thrombolysis. EXAM: BILATERAL PULMONARY ARTERIOGRAPHY; IR INFUSION THROMBOL ARTERIAL INITIAL (MS); IR ULTRASOUND GUIDANCE VASC ACCESS RIGHT; ADDITIONAL ARTERIOGRAPHY  1. Ultrasound-guided access right common femoral vein 2. Second ultrasound-guided access right common femoral vein 3. Catheterization of the main pulmonary artery with main pulmonary arteriogram and pressure measurements 4. Catheterization of the left pulmonary artery with arteriogram 5. Placement of a left lower lobe pulmonary artery multi sidehole ultrasound assisted infusion catheter 6. Catheterization of the right pulmonary artery with arteriogram 7. Placement of a right lower lobe pulmonary artery multi sidehole ultrasound assisted infusion catheter 8. Initiation of bilateral pulmonary arterial thrombolysis COMPARISON:  CT PE study 12/29/2017 MEDICATIONS: None. ANESTHESIA/SEDATION: Versed 1 mg IV; Fentanyl 50 mcg IV Moderate Sedation Time:  39 minutes The patient was continuously monitored during the procedure by the interventional radiology nurse under my direct supervision. FLUOROSCOPY TIME:  Fluoroscopy Time: 11 minutes 0 seconds (122 mGy). COMPLICATIONS: None immediate. TECHNIQUE: Informed written consent was obtained from the patient after a thorough discussion of the procedural risks, benefits and alternatives. All questions  were addressed. Maximal Sterile Barrier Technique was utilized including caps, mask, sterile gowns, sterile gloves, sterile drape, hand hygiene and skin antiseptic. A timeout was performed prior to the initiation of the procedure. The right was interrogated with ultrasound and found to be widely patent. An image was obtained and stored for the medical record. Local anesthesia was attained by infiltration with 1% lidocaine. A small dermatotomy was made. Under real-time sonographic guidance, the vessel was punctured with a 21 gauge micropuncture needle. Using standard technique, the initial micro needle was exchanged over a 0.018 micro wire for a transitional 4 [place] micro sheath. The micro sheath was then exchanged over a 0.035 wire for a 6 French vascular sheath. Using the same technique, a second 6 [place] vascular sheath was also placed into the common femoral vein just lateral to the first. Working through the more medial sheath, a C2 cobra catheter was advanced over a Bentson wire in used to select the main pulmonary artery. A main pulmonary arteriogram was performed. There is massive enlargement of the pulmonary trunk and central left and right pulmonary arteries with very poor opacification of the lobar pulmonary arteries bilaterally secondary to acute thrombus. The pulmonary arterial pressure was obtained at 53/25 (35) mm Hg. This is consistent with pulmonary arterial hypertension. The C2 cobra catheter was then navigated into the left main pulmonary artery. A left pulmonary arteriogram was performed. Occlusive thrombus is present throughout the left lower and upper lobe pulmonary arteries. Using a glidewire, the catheter was successfully navigated into the distal aspect of the left lower lobe pulmonary artery. The glidewire was then exchanged for a Rosen wire. The 5 French catheter was removed and a 12 cm infusion length EKOS multi sidehole ultrasound assisted infusion catheter was advanced over the wire and  positioned in the left lower lobe pulmonary artery. The C2 cobra catheter was then advanced through the more lateral sheath and navigated into the right main pulmonary artery. A right main pulmonary arteriogram was performed again demonstrating occlusive thrombus within the right upper, middle and lower lobe pulmonary arteries. The catheter was successfully navigated over a Glidewire into the right lower lobe pulmonary artery. The glidewire was then exchanged for a rose in wire. The 5 French catheter was removed and a second 12 cm infusion length EKOS multi sidehole ultrasound assisted infusion catheter was advanced over the wire and positioned in the right lower lobe pulmonary artery. The infusion catheters were connected to tPA and bilateral pulmonary artery arterial thrombolysis was initiated at a rate of 1 milligram/hour per catheter for a  total of 2 milligrams/hour. The sheaths were secured with 0 Prolene suture. The patient tolerated the procedure well. FINDINGS: Main PAP: 53/25 (35) mm Hg IMPRESSION: 1. Suspect acute on chronic pulmonary artery hypertension. The pulmonary trunk and main pulmonary arteries are massive suggesting underlying chronic pulmonary arterial hypertension. 2. Occlusive or near occlusive thrombus present within the lobar arteries of all lobes of both lungs. 3. Successful initiation of bilateral pulmonary artery thrombolysis using ultrasound assisted multi side-hole infusion catheters (EKOS). Signed, Criselda Peaches, MD [facility] Interventional Radiology Specialists [facility] Radiology Electronically Signed   By: Jacqulynn Cadet M.D.   On: 12/29/2017 11:37   Ir Jacolyn Reedy F/u Elizabeth Sauer Art/ven Final Day (ms)  Result Date: 12/30/2017 INDICATION: Acute bilateral sub massive pulmonary emboli, status post PE lysis EXAM: PE THROMBO LYSIS FOLLOW-UP, FINAL DAY COMPARISON:  12/29/2017 MEDICATIONS: None. ANESTHESIA/SEDATION: NONE. The patient was continuously monitored during the procedure by  the interventional radiology nurse under my direct supervision. FLUOROSCOPY TIME:  Fluoroscopy Time: None. COMPLICATIONS: None immediate. TECHNIQUE: At the bedside, central pulmonary artery pressure measurements obtained. Right femoral access removed. Hemostasis obtained with manual compression. No immediate complication. Patient tolerated the procedure well. FINDINGS: Post lysis PA pressure: 35/15 mm Hg (23) compared to a pretreatment pressure of 53/25 mm Hg (35) IMPRESSION: Completion of the PE lysis protocol with improvement in the PA pressures as above. Electronically Signed   By: Jerilynn Mages.  Shick M.D.   On: 12/30/2017 11:07    ECG & Cardiac Imaging    EKG:  The EKG was personally reviewed and demonstrates SR with TWI in v1-v4.  Echo: 12/29/17  Study Conclusions  - Left ventricle: Septal flattening consistent with possible   elevated right sided pressures. The cavity size was normal. Wall   thickness was increased in a pattern of severe LVH. Systolic   function was normal. The estimated ejection fraction was in the   range of 60% to 65%. Wall motion was normal; there were no   regional wall motion abnormalities. Doppler parameters are   consistent with abnormal left ventricular relaxation (grade 1   diastolic dysfunction). - Right ventricle: RV appears dilated and hypokinetic. - Atrial septum: No defect or patent foramen ovale was identified. - Tricuspid valve: There was moderate regurgitation. - Pulmonary arteries: PA peak pressure: 51 mm Hg (S). - Impressions: Findings are concerning for acute pulmonary embolus.  Impressions:  - Findings are concerning for acute pulmonary embolus.  Assessment & Plan    67 yo female with PMH of obesity, HTN, HL, tobacco use, L sided Breast Ca who presented with progressive dyspnea and found to have bilateral saddle PE. Noted to have asymptomatic bradycardia and brief pause on telemetry.   1. Asymptomatic bradycardia: 2.6 sec pause noted on telemetry  at 2am this morning while at rest. Patient asymptomatic. Does have an official dx of OSA but suspect the same. HR in the appropriate range while awake. Consider outpatient sleep study.   2. Bilateral PEs with right heart strain: Noted on CT on admission and underwent lysis on 4/21. Has been weaned from O2. Now on Xarelto. Right heart strain noted on echo on admission. EKG today with TWI in v1-v4 suspect from strain.  -- would plan for outpatient follow up echo in 6 weeks   3. Hx of Left sided Breast Ca: on anastrozole  4. HTN: stable  Signed, Reino Bellis, NP-C Pager [phone] 01/02/2018, 2:14 PM

## 2018-01-02 NOTE — Progress Notes (Signed)
PROGRESS NOTE    Sheila Jacobs  HYQ:[id number] DOB: 20-Jan-1951 DOA: 12/28/2017 PCP: Velna Hatchet, MD  Brief Narrative:67 year old female with a history of breast cancer lumpectomy and radiation 2017 admitted to [facility] 12/28/2017 with progressive dyspnea found to have bilateral saddle PE status post lysis by IR 12/29/2017.  TRH picked up 12/31/2017.P CCM would like to keep her in the hospital until she is completely weaned off oxygen.     Assessment & Plan:   Active Problems:   Pulmonary embolism (HCC)   Hypoxia 1] acute hypoxic respiratory failure secondary to saddle pulmonary embolism bilateral status post lysis by interventional radiology-heparin has been DC'd and Xarelto has been started pharmacy following.  2] hypothyroidism continue Synthroid.  3] breast cancer restart anastrozole 1 mg daily.  4] history of hypertension currently her blood pressure has been normal to soft.  She took hydrochlorothiazide at home.will give a bag of ns.  5] hypokalemia K of 3.5 replete and recheck labs tomorrow.  6]bradycardia -check ekg.card consult.      DVT prophylaxis xarelto Code Status:full Family Communication:none Disposition Plan:  tbd  Consultants:cardiology   Procedures:none  Antimicrobials:none  Subjective: complaits of feeling palpitations overnight.off of oxygen Objective: Vitals:   01/01/18 1800 01/01/18 2346 01/02/18 0158 01/02/18 0738  BP:  (!) 108/58 123/60 105/88  Pulse:  64  73  Resp:  20  (!) 23  Temp:  97.7 F (36.5 C)  98.2 F (36.8 C)  TempSrc:  Oral  Oral  SpO2:  99%    Weight: (!) 136.5 kg (300 lb 14.4 oz)     Height:        Intake/Output Summary (Last 24 hours) at 01/02/2018 1408 Last data filed at 01/02/2018 0919 Gross per 24 hour  Intake 783 ml  Output -  Net 783 ml   Filed Weights   12/29/17 1200 12/30/17 2155 01/01/18 1800  Weight: 134 kg (295 lb 6.7 oz) 135.9 kg (299 lb 9.7 oz) (!) 136.5 kg (300 lb 14.4 oz)     Examination:  General exam: Appears calm and comfortable  Respiratory system: Clear to auscultation. Respiratory effort normal. Cardiovascular system: S1 & S2 heard, RRR. No JVD, murmurs, rubs, gallops or clicks. No pedal edema. Gastrointestinal system: Abdomen is nondistended, soft and nontender. No organomegaly or masses felt. Normal bowel sounds heard. Central nervous system: Alert and oriented. No focal neurological deficits. Extremities: Symmetric 5 x 5 power. Skin: No rashes, lesions or ulcers Psychiatry: Judgement and insight appear normal. Mood & affect appropriate.     Data Reviewed: I have personally reviewed following labs and imaging studies  CBC: Recent Labs  Lab 12/28/17 2126 12/29/17 1715 12/30/17 0317 12/30/17 1517 12/31/17 0250 01/01/18 0219  WBC 6.9 6.1 5.5 5.8 4.7 4.4  NEUTROABS 5.0  --   --   --   --  2.2  HGB 12.4 11.4* 10.1* 10.3* 9.6* 9.9*  HCT 36.9 34.3* 30.5* 32.1* 29.4* 30.8*  MCV 96.3 99.4 97.4 97.9 98.7 98.4  PLT 161 124* 101* 77* 105* 952*   Basic Metabolic Panel: Recent Labs  Lab 12/29/17 0442 12/30/17 0317 12/30/17 1950 12/31/17 0250 01/01/18 0219  NA 139 137 141 137 137  K 3.6 2.9* 5.4* 3.5 3.8  CL 102 107 112* 107 105  CO2 26 23 18* 23 24  GLUCOSE 112* 104* 138* 109* 124*  BUN 11 6 7  <5* 7  CREATININE 0.90 0.72 0.80 0.69 0.79  CALCIUM 9.2 8.1* 9.1 8.7* 8.7*  MG 2.0 1.8  --   --   --  PHOS 2.2* 2.6  --   --   --    GFR: Estimated Creatinine Clearance: 90.1 mL/min (by C-G formula based on SCr of 0.79 mg/dL). Liver Function Tests: Recent Labs  Lab 12/28/17 2126  AST 33  ALT 11*  ALKPHOS 63  BILITOT 1.2  PROT 8.0  ALBUMIN 3.6   No results for input(s): LIPASE, AMYLASE in the last 168 hours. No results for input(s): AMMONIA in the last 168 hours. Coagulation Profile: Recent Labs  Lab 12/29/17 0937  INR 1.20   Cardiac Enzymes: No results for input(s): CKTOTAL, CKMB, CKMBINDEX, TROPONINI in the last 168  hours. BNP (last 3 results) No results for input(s): PROBNP in the last 8760 hours. HbA1C: No results for input(s): HGBA1C in the last 72 hours. CBG: Recent Labs  Lab 01/01/18 2008 01/01/18 2343 01/02/18 0423 01/02/18 0740 01/02/18 1113  GLUCAP 126* 92 105* 101* 125*   Lipid Profile: No results for input(s): CHOL, HDL, LDLCALC, TRIG, CHOLHDL, LDLDIRECT in the last 72 hours. Thyroid Function Tests: No results for input(s): TSH, T4TOTAL, FREET4, T3FREE, THYROIDAB in the last 72 hours. Anemia Panel: No results for input(s): VITAMINB12, FOLATE, FERRITIN, TIBC, IRON, RETICCTPCT in the last 72 hours. Sepsis Labs: Recent Labs  Lab 12/28/17 2139 12/28/17 2330 12/29/17 1715  LATICACIDVEN 3.56* 2.56* 1.9    Recent Results (from the past 240 hour(s))  MRSA PCR Screening     Status: None   Collection Time: 12/29/17 12:42 PM  Result Value Ref Range Status   MRSA by PCR NEGATIVE NEGATIVE Final    Comment:        The GeneXpert MRSA Assay (FDA approved for NASAL specimens only), is one component of a comprehensive MRSA colonization surveillance program. It is not intended to diagnose MRSA infection nor to guide or monitor treatment for MRSA infections. Performed at [facility], [place] [address], [place], [place] [address]          Radiology Studies: No results found.      Scheduled Meds: . anastrozole  1 mg Oral Daily  . insulin aspart  0-15 Units Subcutaneous Q4H  . levothyroxine  150 mcg Oral QAC breakfast  . mouth rinse  15 mL Mouth Rinse BID  . pantoprazole  40 mg Oral Daily  . rivaroxaban  15 mg Oral BID   Followed by  . [START ON 01/20/2018] rivaroxaban  20 mg Oral Q supper  . sodium chloride flush  3 mL Intravenous Q12H   Continuous Infusions: . sodium chloride    . sodium chloride    . sodium chloride    . sodium chloride 10 mL/hr at 12/29/17 2230  . sodium chloride       LOS: 4 days    Georgette Shell, MD [facility]  If  7PM-7AM, please contact night-coverage [email or web address] Password [facility] 01/02/2018, 2:08 PM

## 2018-01-02 NOTE — Progress Notes (Signed)
On call Baltazar Najjar was text paged to inform her of the patient heart rate dropping into the low 30s and having a 2.64 second pause. Pt's vital signs are stable and patient is assypmtomatic. Will continue to monitor the patient's HR. BP, and LOC.

## 2018-01-03 ENCOUNTER — Telehealth: Payer: Self-pay | Admitting: *Deleted

## 2018-01-03 DIAGNOSIS — E039 Hypothyroidism, unspecified: Secondary | ICD-10-CM

## 2018-01-03 MED ORDER — RIVAROXABAN (XARELTO) VTE STARTER PACK (15 & 20 MG): ORAL_TABLET | ORAL | 0 refills | Status: DC

## 2018-01-03 NOTE — Care Management Note (Signed)
Case Management Note  Patient Details  Name: Sheila Jacobs MRN: [id number] Date of Birth: 1951-01-06  Subjective/Objective:  From home alone, presents with PE , R DVT , on xarelto, NCM gave patient the 30 day savings coupon. She will be going home with daughter at dc to [address] [place] [address].  Daughter chose [facility] from the [facility] list, referral made to [facility], they are able to take patient soc will begin on Monday or Tuesday of next week , daughter states this is fine.  [facility] will bring rolling walker up to patient's room prior to discharge.                   Action/Plan: DC home with [facility] services and rolling walker when ready.  Expected Discharge Date:  01/03/18               Expected Discharge Plan:  [place]  In-House Referral:     Discharge planning Services  CM Consult  Post Acute Care Choice:  Durable Medical Equipment, Home Health Choice offered to:  Adult Children  DME Arranged:  Walker rolling DME Agency:  [place] Arranged:  PT [place] Agency:  [facility]  Status of Service:  Completed, signed off  If discussed at [place] of Stay Meetings, dates discussed:    Additional Comments:  Zenon Mayo, RN 01/03/2018, 10:21 AM

## 2018-01-03 NOTE — Discharge Summary (Signed)
Physician Discharge Summary  Sheila Jacobs SKA:[id number] DOB: 01/07/1951 DOA: 12/28/2017  PCP: Velna Hatchet, MD  Admit date: 12/28/2017 Discharge date: 01/03/2018  Admitted From: Home Disposition: Home  Recommendations for Outpatient Follow-up:  1. Follow up with PCP in 1-2 weeks 2. Please obtain BMP/CBC in one week  Home Health yes Equipment/Devices none  Discharge Condition: Stable CODE STATUS full code Diet recommendation: Cardiac  Brief/Interim Summary: 67 year old female with a history of breast cancer lumpectomy and radiation 2017 admitted to [facility] 12/28/2017 with progressive dyspnea found to have bilateral saddle PE status post lysis by IR 12/29/2017. TRH picked up 12/31/2017.PCCM would like to keep her in the hospital until she is completely weaned off oxygen.    Discharge Diagnoses:  Active Problems:   Pulmonary embolism (HCC)   Hypoxia   Bradycardia 1]acute hypoxic respiratory failure secondary to saddle pulmonary embolism bilateral status post lysis by interventional radiology.  She will be discharged on Xarelto.  2]hypothyroidism continue Synthroid.  3]breast cancer restart anastrozole 1 mg daily.  4]history of hypertension her blood pressure has been soft to normal during this hospital stay.  She was taking hydrochlorothiazide at home which will not be restarted due to this reason.  She will need to follow-up with her primary care physician in 2 weeks.  5]hypokalemia -resolved.  6]bradycardia -patient was seen by cardiology and deemed that she was asymptomatic and this happened while she was sleeping which is probably secondary to increased vagal tone during sleep and they recommended sleep study as an outpatient.      Discharge Instructions  Discharge Instructions    Call MD for:  difficulty breathing, headache or visual disturbances   Complete by:  As directed    Call MD for:  persistant dizziness or light-headedness   Complete by:  As  directed    Call MD for:  persistant nausea and vomiting   Complete by:  As directed    Call MD for:  severe uncontrolled pain   Complete by:  As directed    Call MD for:  temperature >100.4   Complete by:  As directed    Diet - low sodium heart healthy   Complete by:  As directed    Diet - low sodium heart healthy   Complete by:  As directed    Face-to-face encounter (required for Medicare/Medicaid patients)   Complete by:  As directed    I Georgette Shell certify that this patient is under my care and that I, or a nurse practitioner or physician's assistant working with me, had a face-to-face encounter that meets the physician face-to-face encounter requirements with this patient on 01/02/2018. The encounter with the patient was in whole, or in part for the following medical condition(s) which is the primary reason for home health care (List medical condition): pe,unsteady gait   The encounter with the patient was in whole, or in part, for the following medical condition, which is the primary reason for home health care:  pulmonary embolism unsteady gait   I certify that, based on my findings, the following services are medically necessary home health services:  Physical therapy   Reason for Medically Necessary Home Health Services:  Therapy- Personnel officer, Public librarian   My clinical findings support the need for the above services:  Unable to leave home safely without assistance and/or assistive device   Further, I certify that my clinical findings support that this patient is homebound due to:  Unsafe ambulation due to  balance issues   For home use only DME 4 wheeled rolling walker with seat   Complete by:  As directed    Patient needs a walker to treat with the following condition:  Unsteady gait   Home Health   Complete by:  As directed    To provide the following care/treatments:  PT   Increase activity slowly   Complete by:  As directed    Increase  activity slowly   Complete by:  As directed      Allergies as of 01/03/2018      Reactions   Cortisone Rash   Effexor [venlafaxine] Palpitations   Lexapro [escitalopram] Palpitations   Heart racing      Medication List    STOP taking these medications   hydrochlorothiazide 25 MG tablet Commonly known as:  HYDRODIURIL     TAKE these medications   ALPRAZolam 0.25 MG tablet Commonly known as:  XANAX TAKE ONE TABLET BY MOUTH TWICE DAILY AS NEEDED FOR ANXIETY   anastrozole 1 MG tablet Commonly known as:  ARIMIDEX Take 1 tablet (1 mg total) by mouth daily.   aspirin EC 81 MG tablet Take 81 mg by mouth daily.   ketoconazole 2 % cream Commonly known as:  NIZORAL APPLY TOPICALLY DAILY AS NEEDED FOR IRRITATION   levothyroxine 150 MCG tablet Commonly known as:  SYNTHROID, LEVOTHROID Take 1 tablet (150 mcg total) by mouth daily.   pantoprazole 40 MG tablet Commonly known as:  PROTONIX TAKE ONE TABLET BY MOUTH ONCE DAILY   Rivaroxaban 15 & 20 MG Tbpk Take as directed on package: Start with one 15mg  tablet by mouth twice a day with food. On Day 22, switch to one 20mg  tablet once a day with food.            Durable Medical Equipment  (From admission, onward)        Start     Ordered   01/03/18 1018  For home use only DME Walker rolling  Once    Question:  Patient needs a walker to treat with the following condition  Answer:  Weakness   01/03/18 1018   01/02/18 0000  For home use only DME 4 wheeled rolling walker with seat    Question:  Patient needs a walker to treat with the following condition  Answer:  Unsteady gait   01/02/18 1431     Follow-up Information    Velna Hatchet, MD Follow up.   Specialty:  Internal Medicine Contact information: [place] [address] [phone]        [facility] Follow up.   Specialty:  Home Health Services Why:  HHPT Contact information: [address]  [address] [phone]        [facility] - Dme Follow up.   Why:  rolling walker will be brought to patients room prior to [employer] information: [place] [address] [phone]          Allergies  Allergen Reactions  . Cortisone Rash  . Effexor [Venlafaxine] Palpitations  . Lexapro [Escitalopram] Palpitations    Heart racing    Consultations:  pccm   Procedures/Studies: Dg Chest 2 View  Result Date: 12/28/2017 CLINICAL DATA:  Increasing shortness of breath EXAM: CHEST - 2 VIEW COMPARISON:  10/06/2017 FINDINGS: Cardiac shadow is stable. Aortic calcifications are again seen. Mild increased vascular congestion is noted without interstitial edema. The lungs are well aerated bilaterally.  No focal infiltrate or sizable effusion is seen. Mild degenerative changes of the thoracic spine are noted. IMPRESSION: Mild increased vascular congestion without edema. No other focal abnormality is seen. Electronically Signed   By: Inez Catalina M.D.   On: 12/28/2017 22:01   Ct Angio Chest Pe W And/or Wo Contrast  Result Date: 12/29/2017 CLINICAL DATA:  Shortness of breath and chest pain EXAM: CT ANGIOGRAPHY CHEST WITH CONTRAST TECHNIQUE: Multidetector CT imaging of the chest was performed using the standard protocol during bolus administration of intravenous contrast. Multiplanar CT image reconstructions and MIPs were obtained to evaluate the vascular anatomy. CONTRAST:  120mL ISOVUE-370 IOPAMIDOL (ISOVUE-370) INJECTION 76% COMPARISON:  Chest radiograph 12/28/2017 FINDINGS: Cardiovascular: Contrast injection is sufficient to demonstrate satisfactory opacification of the pulmonary arteries to the segmental level.There is large volume pulmonary embolus within both distal main pulmonary arteries and extending into the lobar and segmental branches. There is evidence of right heart strain with an RV/LV ratio of approximately 3. The main pulmonary artery is enlarged,  measuring 4 cm at the bifurcation. There is a normal 3-vessel arch branching pattern without evidence of acute aortic syndrome. There is mildaortic atherosclerosis. Heart size is normal, without pericardial effusion. Mediastinum/Nodes: No mediastinal, hilar or axillary lymphadenopathy. The visualized thyroid and thoracic esophageal course are unremarkable. Lungs/Pleura: No pulmonary nodules or masses. No pleural effusion or pneumothorax. No focal airspace consolidation. No focal pleural abnormality. Upper Abdomen: Contrast bolus timing is not optimized for evaluation of the abdominal organs. Within this limitation, the visualized organs of the upper abdomen are normal. Musculoskeletal: No chest wall abnormality. No acute or significant osseous findings. Review of the MIP images confirms the above findings. IMPRESSION: 1. Positive for acute bilateral main pulmonary artery embolus with CT evidence of right heart strain (RV/LV Ratio = 3), consistent with at least submassive (intermediate risk) PE. Large volume emboli extend into the lobar and segmental arteries of both lungs. The presence of right heart strain has been associated with an increased risk of morbidity and mortality. Please activate Code PE by paging [phone]. 2.  Aortic Atherosclerosis (ICD10-I70.0). Critical Value/emergent results were called by telephone at the time of interpretation on 12/29/2017 at 3:07 am to [facility] , who verbally acknowledged these results. Electronically Signed   By: Ulyses Jarred M.D.   On: 12/29/2017 03:10   Ir Angiogram Pulmonary Bilateral Selective  Result Date: 12/29/2017 INDICATION: 67 year old female with acute large volume bilateral pulmonary emboli and associated cor pulmonale (submassive/intermediate risk pulmonary embolus). She presents for catheter directed bilateral pulmonary arterial thrombolysis. EXAM: BILATERAL PULMONARY ARTERIOGRAPHY; IR INFUSION THROMBOL ARTERIAL INITIAL (MS); IR ULTRASOUND GUIDANCE  VASC ACCESS RIGHT; ADDITIONAL ARTERIOGRAPHY 1. Ultrasound-guided access right common femoral vein 2. Second ultrasound-guided access right common femoral vein 3. Catheterization of the main pulmonary artery with main pulmonary arteriogram and pressure measurements 4. Catheterization of the left pulmonary artery with arteriogram 5. Placement of a left lower lobe pulmonary artery multi sidehole ultrasound assisted infusion catheter 6. Catheterization of the right pulmonary artery with arteriogram 7. Placement of a right lower lobe pulmonary artery multi sidehole ultrasound assisted infusion catheter 8. Initiation of bilateral pulmonary arterial thrombolysis COMPARISON:  CT PE study 12/29/2017 MEDICATIONS: None. ANESTHESIA/SEDATION: Versed 1 mg IV; Fentanyl 50 mcg IV Moderate Sedation Time:  39 minutes The patient was continuously monitored during the procedure by the interventional radiology nurse under my direct supervision. FLUOROSCOPY TIME:  Fluoroscopy Time: 11 minutes 0 seconds (122 mGy). COMPLICATIONS: None immediate. TECHNIQUE: Informed written consent was obtained  from the patient after a thorough discussion of the procedural risks, benefits and alternatives. All questions were addressed. Maximal Sterile Barrier Technique was utilized including caps, mask, sterile gowns, sterile gloves, sterile drape, hand hygiene and skin antiseptic. A timeout was performed prior to the initiation of the procedure. The right was interrogated with ultrasound and found to be widely patent. An image was obtained and stored for the medical record. Local anesthesia was attained by infiltration with 1% lidocaine. A small dermatotomy was made. Under real-time sonographic guidance, the vessel was punctured with a 21 gauge micropuncture needle. Using standard technique, the initial micro needle was exchanged over a 0.018 micro wire for a transitional 4 [place] micro sheath. The micro sheath was then exchanged over a 0.035 wire for a 6  French vascular sheath. Using the same technique, a second 6 [place] vascular sheath was also placed into the common femoral vein just lateral to the first. Working through the more medial sheath, a C2 cobra catheter was advanced over a Bentson wire in used to select the main pulmonary artery. A main pulmonary arteriogram was performed. There is massive enlargement of the pulmonary trunk and central left and right pulmonary arteries with very poor opacification of the lobar pulmonary arteries bilaterally secondary to acute thrombus. The pulmonary arterial pressure was obtained at 53/25 (35) mm Hg. This is consistent with pulmonary arterial hypertension. The C2 cobra catheter was then navigated into the left main pulmonary artery. A left pulmonary arteriogram was performed. Occlusive thrombus is present throughout the left lower and upper lobe pulmonary arteries. Using a glidewire, the catheter was successfully navigated into the distal aspect of the left lower lobe pulmonary artery. The glidewire was then exchanged for a Rosen wire. The 5 French catheter was removed and a 12 cm infusion length EKOS multi sidehole ultrasound assisted infusion catheter was advanced over the wire and positioned in the left lower lobe pulmonary artery. The C2 cobra catheter was then advanced through the more lateral sheath and navigated into the right main pulmonary artery. A right main pulmonary arteriogram was performed again demonstrating occlusive thrombus within the right upper, middle and lower lobe pulmonary arteries. The catheter was successfully navigated over a Glidewire into the right lower lobe pulmonary artery. The glidewire was then exchanged for a rose in wire. The 5 French catheter was removed and a second 12 cm infusion length EKOS multi sidehole ultrasound assisted infusion catheter was advanced over the wire and positioned in the right lower lobe pulmonary artery. The infusion catheters were connected to tPA and  bilateral pulmonary artery arterial thrombolysis was initiated at a rate of 1 milligram/hour per catheter for a total of 2 milligrams/hour. The sheaths were secured with 0 Prolene suture. The patient tolerated the procedure well. FINDINGS: Main PAP: 53/25 (35) mm Hg IMPRESSION: 1. Suspect acute on chronic pulmonary artery hypertension. The pulmonary trunk and main pulmonary arteries are massive suggesting underlying chronic pulmonary arterial hypertension. 2. Occlusive or near occlusive thrombus present within the lobar arteries of all lobes of both lungs. 3. Successful initiation of bilateral pulmonary artery thrombolysis using ultrasound assisted multi side-hole infusion catheters (EKOS). Signed, Criselda Peaches, MD [facility] Radiology Electronically Signed   By: Jacqulynn Cadet M.D.   On: 12/29/2017 11:37   Ir Angiogram Selective Each Additional Vessel  Result Date: 12/29/2017 INDICATION: 67 year old female with acute large volume bilateral pulmonary emboli and associated cor pulmonale (submassive/intermediate risk pulmonary embolus). She presents for catheter directed bilateral  pulmonary arterial thrombolysis. EXAM: BILATERAL PULMONARY ARTERIOGRAPHY; IR INFUSION THROMBOL ARTERIAL INITIAL (MS); IR ULTRASOUND GUIDANCE VASC ACCESS RIGHT; ADDITIONAL ARTERIOGRAPHY 1. Ultrasound-guided access right common femoral vein 2. Second ultrasound-guided access right common femoral vein 3. Catheterization of the main pulmonary artery with main pulmonary arteriogram and pressure measurements 4. Catheterization of the left pulmonary artery with arteriogram 5. Placement of a left lower lobe pulmonary artery multi sidehole ultrasound assisted infusion catheter 6. Catheterization of the right pulmonary artery with arteriogram 7. Placement of a right lower lobe pulmonary artery multi sidehole ultrasound assisted infusion catheter 8. Initiation of bilateral pulmonary  arterial thrombolysis COMPARISON:  CT PE study 12/29/2017 MEDICATIONS: None. ANESTHESIA/SEDATION: Versed 1 mg IV; Fentanyl 50 mcg IV Moderate Sedation Time:  39 minutes The patient was continuously monitored during the procedure by the interventional radiology nurse under my direct supervision. FLUOROSCOPY TIME:  Fluoroscopy Time: 11 minutes 0 seconds (122 mGy). COMPLICATIONS: None immediate. TECHNIQUE: Informed written consent was obtained from the patient after a thorough discussion of the procedural risks, benefits and alternatives. All questions were addressed. Maximal Sterile Barrier Technique was utilized including caps, mask, sterile gowns, sterile gloves, sterile drape, hand hygiene and skin antiseptic. A timeout was performed prior to the initiation of the procedure. The right was interrogated with ultrasound and found to be widely patent. An image was obtained and stored for the medical record. Local anesthesia was attained by infiltration with 1% lidocaine. A small dermatotomy was made. Under real-time sonographic guidance, the vessel was punctured with a 21 gauge micropuncture needle. Using standard technique, the initial micro needle was exchanged over a 0.018 micro wire for a transitional 4 [place] micro sheath. The micro sheath was then exchanged over a 0.035 wire for a 6 French vascular sheath. Using the same technique, a second 6 [place] vascular sheath was also placed into the common femoral vein just lateral to the first. Working through the more medial sheath, a C2 cobra catheter was advanced over a Bentson wire in used to select the main pulmonary artery. A main pulmonary arteriogram was performed. There is massive enlargement of the pulmonary trunk and central left and right pulmonary arteries with very poor opacification of the lobar pulmonary arteries bilaterally secondary to acute thrombus. The pulmonary arterial pressure was obtained at 53/25 (35) mm Hg. This is consistent with pulmonary  arterial hypertension. The C2 cobra catheter was then navigated into the left main pulmonary artery. A left pulmonary arteriogram was performed. Occlusive thrombus is present throughout the left lower and upper lobe pulmonary arteries. Using a glidewire, the catheter was successfully navigated into the distal aspect of the left lower lobe pulmonary artery. The glidewire was then exchanged for a Rosen wire. The 5 French catheter was removed and a 12 cm infusion length EKOS multi sidehole ultrasound assisted infusion catheter was advanced over the wire and positioned in the left lower lobe pulmonary artery. The C2 cobra catheter was then advanced through the more lateral sheath and navigated into the right main pulmonary artery. A right main pulmonary arteriogram was performed again demonstrating occlusive thrombus within the right upper, middle and lower lobe pulmonary arteries. The catheter was successfully navigated over a Glidewire into the right lower lobe pulmonary artery. The glidewire was then exchanged for a rose in wire. The 5 French catheter was removed and a second 12 cm infusion length EKOS multi sidehole ultrasound assisted infusion catheter was advanced over the wire and positioned in the right lower lobe pulmonary artery. The infusion catheters were  connected to tPA and bilateral pulmonary artery arterial thrombolysis was initiated at a rate of 1 milligram/hour per catheter for a total of 2 milligrams/hour. The sheaths were secured with 0 Prolene suture. The patient tolerated the procedure well. FINDINGS: Main PAP: 53/25 (35) mm Hg IMPRESSION: 1. Suspect acute on chronic pulmonary artery hypertension. The pulmonary trunk and main pulmonary arteries are massive suggesting underlying chronic pulmonary arterial hypertension. 2. Occlusive or near occlusive thrombus present within the lobar arteries of all lobes of both lungs. 3. Successful initiation of bilateral pulmonary artery thrombolysis using  ultrasound assisted multi side-hole infusion catheters (EKOS). Signed, Criselda Peaches, MD [facility] Radiology Electronically Signed   By: Jacqulynn Cadet M.D.   On: 12/29/2017 11:37   Ir Angiogram Selective Each Additional Vessel  Result Date: 12/29/2017 INDICATION: 67 year old female with acute large volume bilateral pulmonary emboli and associated cor pulmonale (submassive/intermediate risk pulmonary embolus). She presents for catheter directed bilateral pulmonary arterial thrombolysis. EXAM: BILATERAL PULMONARY ARTERIOGRAPHY; IR INFUSION THROMBOL ARTERIAL INITIAL (MS); IR ULTRASOUND GUIDANCE VASC ACCESS RIGHT; ADDITIONAL ARTERIOGRAPHY 1. Ultrasound-guided access right common femoral vein 2. Second ultrasound-guided access right common femoral vein 3. Catheterization of the main pulmonary artery with main pulmonary arteriogram and pressure measurements 4. Catheterization of the left pulmonary artery with arteriogram 5. Placement of a left lower lobe pulmonary artery multi sidehole ultrasound assisted infusion catheter 6. Catheterization of the right pulmonary artery with arteriogram 7. Placement of a right lower lobe pulmonary artery multi sidehole ultrasound assisted infusion catheter 8. Initiation of bilateral pulmonary arterial thrombolysis COMPARISON:  CT PE study 12/29/2017 MEDICATIONS: None. ANESTHESIA/SEDATION: Versed 1 mg IV; Fentanyl 50 mcg IV Moderate Sedation Time:  39 minutes The patient was continuously monitored during the procedure by the interventional radiology nurse under my direct supervision. FLUOROSCOPY TIME:  Fluoroscopy Time: 11 minutes 0 seconds (122 mGy). COMPLICATIONS: None immediate. TECHNIQUE: Informed written consent was obtained from the patient after a thorough discussion of the procedural risks, benefits and alternatives. All questions were addressed. Maximal Sterile Barrier Technique was utilized including caps, mask,  sterile gowns, sterile gloves, sterile drape, hand hygiene and skin antiseptic. A timeout was performed prior to the initiation of the procedure. The right was interrogated with ultrasound and found to be widely patent. An image was obtained and stored for the medical record. Local anesthesia was attained by infiltration with 1% lidocaine. A small dermatotomy was made. Under real-time sonographic guidance, the vessel was punctured with a 21 gauge micropuncture needle. Using standard technique, the initial micro needle was exchanged over a 0.018 micro wire for a transitional 4 [place] micro sheath. The micro sheath was then exchanged over a 0.035 wire for a 6 French vascular sheath. Using the same technique, a second 6 [place] vascular sheath was also placed into the common femoral vein just lateral to the first. Working through the more medial sheath, a C2 cobra catheter was advanced over a Bentson wire in used to select the main pulmonary artery. A main pulmonary arteriogram was performed. There is massive enlargement of the pulmonary trunk and central left and right pulmonary arteries with very poor opacification of the lobar pulmonary arteries bilaterally secondary to acute thrombus. The pulmonary arterial pressure was obtained at 53/25 (35) mm Hg. This is consistent with pulmonary arterial hypertension. The C2 cobra catheter was then navigated into the left main pulmonary artery. A left pulmonary arteriogram was performed. Occlusive thrombus is present throughout the left lower and upper lobe pulmonary arteries.  Using a glidewire, the catheter was successfully navigated into the distal aspect of the left lower lobe pulmonary artery. The glidewire was then exchanged for a Rosen wire. The 5 French catheter was removed and a 12 cm infusion length EKOS multi sidehole ultrasound assisted infusion catheter was advanced over the wire and positioned in the left lower lobe pulmonary artery. The C2 cobra catheter was then  advanced through the more lateral sheath and navigated into the right main pulmonary artery. A right main pulmonary arteriogram was performed again demonstrating occlusive thrombus within the right upper, middle and lower lobe pulmonary arteries. The catheter was successfully navigated over a Glidewire into the right lower lobe pulmonary artery. The glidewire was then exchanged for a rose in wire. The 5 French catheter was removed and a second 12 cm infusion length EKOS multi sidehole ultrasound assisted infusion catheter was advanced over the wire and positioned in the right lower lobe pulmonary artery. The infusion catheters were connected to tPA and bilateral pulmonary artery arterial thrombolysis was initiated at a rate of 1 milligram/hour per catheter for a total of 2 milligrams/hour. The sheaths were secured with 0 Prolene suture. The patient tolerated the procedure well. FINDINGS: Main PAP: 53/25 (35) mm Hg IMPRESSION: 1. Suspect acute on chronic pulmonary artery hypertension. The pulmonary trunk and main pulmonary arteries are massive suggesting underlying chronic pulmonary arterial hypertension. 2. Occlusive or near occlusive thrombus present within the lobar arteries of all lobes of both lungs. 3. Successful initiation of bilateral pulmonary artery thrombolysis using ultrasound assisted multi side-hole infusion catheters (EKOS). Signed, Criselda Peaches, MD [facility] Radiology Electronically Signed   By: Jacqulynn Cadet M.D.   On: 12/29/2017 11:37   Ir US Guide Vasc Access Right  Result Date: 12/29/2017 INDICATION: 67 year old female with acute large volume bilateral pulmonary emboli and associated cor pulmonale (submassive/intermediate risk pulmonary embolus). She presents for catheter directed bilateral pulmonary arterial thrombolysis. EXAM: BILATERAL PULMONARY ARTERIOGRAPHY; IR INFUSION THROMBOL ARTERIAL INITIAL (MS); IR ULTRASOUND GUIDANCE  VASC ACCESS RIGHT; ADDITIONAL ARTERIOGRAPHY 1. Ultrasound-guided access right common femoral vein 2. Second ultrasound-guided access right common femoral vein 3. Catheterization of the main pulmonary artery with main pulmonary arteriogram and pressure measurements 4. Catheterization of the left pulmonary artery with arteriogram 5. Placement of a left lower lobe pulmonary artery multi sidehole ultrasound assisted infusion catheter 6. Catheterization of the right pulmonary artery with arteriogram 7. Placement of a right lower lobe pulmonary artery multi sidehole ultrasound assisted infusion catheter 8. Initiation of bilateral pulmonary arterial thrombolysis COMPARISON:  CT PE study 12/29/2017 MEDICATIONS: None. ANESTHESIA/SEDATION: Versed 1 mg IV; Fentanyl 50 mcg IV Moderate Sedation Time:  39 minutes The patient was continuously monitored during the procedure by the interventional radiology nurse under my direct supervision. FLUOROSCOPY TIME:  Fluoroscopy Time: 11 minutes 0 seconds (122 mGy). COMPLICATIONS: None immediate. TECHNIQUE: Informed written consent was obtained from the patient after a thorough discussion of the procedural risks, benefits and alternatives. All questions were addressed. Maximal Sterile Barrier Technique was utilized including caps, mask, sterile gowns, sterile gloves, sterile drape, hand hygiene and skin antiseptic. A timeout was performed prior to the initiation of the procedure. The right was interrogated with ultrasound and found to be widely patent. An image was obtained and stored for the medical record. Local anesthesia was attained by infiltration with 1% lidocaine. A small dermatotomy was made. Under real-time sonographic guidance, the vessel was punctured with a 21 gauge micropuncture needle. Using standard technique, the  initial micro needle was exchanged over a 0.018 micro wire for a transitional 4 [place] micro sheath. The micro sheath was then exchanged over a 0.035 wire for a 6  French vascular sheath. Using the same technique, a second 6 [place] vascular sheath was also placed into the common femoral vein just lateral to the first. Working through the more medial sheath, a C2 cobra catheter was advanced over a Bentson wire in used to select the main pulmonary artery. A main pulmonary arteriogram was performed. There is massive enlargement of the pulmonary trunk and central left and right pulmonary arteries with very poor opacification of the lobar pulmonary arteries bilaterally secondary to acute thrombus. The pulmonary arterial pressure was obtained at 53/25 (35) mm Hg. This is consistent with pulmonary arterial hypertension. The C2 cobra catheter was then navigated into the left main pulmonary artery. A left pulmonary arteriogram was performed. Occlusive thrombus is present throughout the left lower and upper lobe pulmonary arteries. Using a glidewire, the catheter was successfully navigated into the distal aspect of the left lower lobe pulmonary artery. The glidewire was then exchanged for a Rosen wire. The 5 French catheter was removed and a 12 cm infusion length EKOS multi sidehole ultrasound assisted infusion catheter was advanced over the wire and positioned in the left lower lobe pulmonary artery. The C2 cobra catheter was then advanced through the more lateral sheath and navigated into the right main pulmonary artery. A right main pulmonary arteriogram was performed again demonstrating occlusive thrombus within the right upper, middle and lower lobe pulmonary arteries. The catheter was successfully navigated over a Glidewire into the right lower lobe pulmonary artery. The glidewire was then exchanged for a rose in wire. The 5 French catheter was removed and a second 12 cm infusion length EKOS multi sidehole ultrasound assisted infusion catheter was advanced over the wire and positioned in the right lower lobe pulmonary artery. The infusion catheters were connected to tPA and  bilateral pulmonary artery arterial thrombolysis was initiated at a rate of 1 milligram/hour per catheter for a total of 2 milligrams/hour. The sheaths were secured with 0 Prolene suture. The patient tolerated the procedure well. FINDINGS: Main PAP: 53/25 (35) mm Hg IMPRESSION: 1. Suspect acute on chronic pulmonary artery hypertension. The pulmonary trunk and main pulmonary arteries are massive suggesting underlying chronic pulmonary arterial hypertension. 2. Occlusive or near occlusive thrombus present within the lobar arteries of all lobes of both lungs. 3. Successful initiation of bilateral pulmonary artery thrombolysis using ultrasound assisted multi side-hole infusion catheters (EKOS). Signed, Criselda Peaches, MD [facility] Radiology Electronically Signed   By: Jacqulynn Cadet M.D.   On: 12/29/2017 11:37   Ir Infusion Thrombol Arterial Initial (ms)  Result Date: 12/29/2017 INDICATION: 67 year old female with acute large volume bilateral pulmonary emboli and associated cor pulmonale (submassive/intermediate risk pulmonary embolus). She presents for catheter directed bilateral pulmonary arterial thrombolysis. EXAM: BILATERAL PULMONARY ARTERIOGRAPHY; IR INFUSION THROMBOL ARTERIAL INITIAL (MS); IR ULTRASOUND GUIDANCE VASC ACCESS RIGHT; ADDITIONAL ARTERIOGRAPHY 1. Ultrasound-guided access right common femoral vein 2. Second ultrasound-guided access right common femoral vein 3. Catheterization of the main pulmonary artery with main pulmonary arteriogram and pressure measurements 4. Catheterization of the left pulmonary artery with arteriogram 5. Placement of a left lower lobe pulmonary artery multi sidehole ultrasound assisted infusion catheter 6. Catheterization of the right pulmonary artery with arteriogram 7. Placement of a right lower lobe pulmonary artery multi sidehole ultrasound assisted infusion catheter 8. Initiation of bilateral pulmonary  arterial  thrombolysis COMPARISON:  CT PE study 12/29/2017 MEDICATIONS: None. ANESTHESIA/SEDATION: Versed 1 mg IV; Fentanyl 50 mcg IV Moderate Sedation Time:  39 minutes The patient was continuously monitored during the procedure by the interventional radiology nurse under my direct supervision. FLUOROSCOPY TIME:  Fluoroscopy Time: 11 minutes 0 seconds (122 mGy). COMPLICATIONS: None immediate. TECHNIQUE: Informed written consent was obtained from the patient after a thorough discussion of the procedural risks, benefits and alternatives. All questions were addressed. Maximal Sterile Barrier Technique was utilized including caps, mask, sterile gowns, sterile gloves, sterile drape, hand hygiene and skin antiseptic. A timeout was performed prior to the initiation of the procedure. The right was interrogated with ultrasound and found to be widely patent. An image was obtained and stored for the medical record. Local anesthesia was attained by infiltration with 1% lidocaine. A small dermatotomy was made. Under real-time sonographic guidance, the vessel was punctured with a 21 gauge micropuncture needle. Using standard technique, the initial micro needle was exchanged over a 0.018 micro wire for a transitional 4 [place] micro sheath. The micro sheath was then exchanged over a 0.035 wire for a 6 French vascular sheath. Using the same technique, a second 6 [place] vascular sheath was also placed into the common femoral vein just lateral to the first. Working through the more medial sheath, a C2 cobra catheter was advanced over a Bentson wire in used to select the main pulmonary artery. A main pulmonary arteriogram was performed. There is massive enlargement of the pulmonary trunk and central left and right pulmonary arteries with very poor opacification of the lobar pulmonary arteries bilaterally secondary to acute thrombus. The pulmonary arterial pressure was obtained at 53/25 (35) mm Hg. This is consistent with pulmonary arterial  hypertension. The C2 cobra catheter was then navigated into the left main pulmonary artery. A left pulmonary arteriogram was performed. Occlusive thrombus is present throughout the left lower and upper lobe pulmonary arteries. Using a glidewire, the catheter was successfully navigated into the distal aspect of the left lower lobe pulmonary artery. The glidewire was then exchanged for a Rosen wire. The 5 French catheter was removed and a 12 cm infusion length EKOS multi sidehole ultrasound assisted infusion catheter was advanced over the wire and positioned in the left lower lobe pulmonary artery. The C2 cobra catheter was then advanced through the more lateral sheath and navigated into the right main pulmonary artery. A right main pulmonary arteriogram was performed again demonstrating occlusive thrombus within the right upper, middle and lower lobe pulmonary arteries. The catheter was successfully navigated over a Glidewire into the right lower lobe pulmonary artery. The glidewire was then exchanged for a rose in wire. The 5 French catheter was removed and a second 12 cm infusion length EKOS multi sidehole ultrasound assisted infusion catheter was advanced over the wire and positioned in the right lower lobe pulmonary artery. The infusion catheters were connected to tPA and bilateral pulmonary artery arterial thrombolysis was initiated at a rate of 1 milligram/hour per catheter for a total of 2 milligrams/hour. The sheaths were secured with 0 Prolene suture. The patient tolerated the procedure well. FINDINGS: Main PAP: 53/25 (35) mm Hg IMPRESSION: 1. Suspect acute on chronic pulmonary artery hypertension. The pulmonary trunk and main pulmonary arteries are massive suggesting underlying chronic pulmonary arterial hypertension. 2. Occlusive or near occlusive thrombus present within the lobar arteries of all lobes of both lungs. 3. Successful initiation of bilateral pulmonary artery thrombolysis using ultrasound  assisted multi side-hole infusion catheters (  EKOS). Signed, Criselda Peaches, MD [facility] Radiology Electronically Signed   By: Jacqulynn Cadet M.D.   On: 12/29/2017 11:37   Ir Infusion Thrombol Arterial Initial (ms)  Result Date: 12/29/2017 INDICATION: 67 year old female with acute large volume bilateral pulmonary emboli and associated cor pulmonale (submassive/intermediate risk pulmonary embolus). She presents for catheter directed bilateral pulmonary arterial thrombolysis. EXAM: BILATERAL PULMONARY ARTERIOGRAPHY; IR INFUSION THROMBOL ARTERIAL INITIAL (MS); IR ULTRASOUND GUIDANCE VASC ACCESS RIGHT; ADDITIONAL ARTERIOGRAPHY 1. Ultrasound-guided access right common femoral vein 2. Second ultrasound-guided access right common femoral vein 3. Catheterization of the main pulmonary artery with main pulmonary arteriogram and pressure measurements 4. Catheterization of the left pulmonary artery with arteriogram 5. Placement of a left lower lobe pulmonary artery multi sidehole ultrasound assisted infusion catheter 6. Catheterization of the right pulmonary artery with arteriogram 7. Placement of a right lower lobe pulmonary artery multi sidehole ultrasound assisted infusion catheter 8. Initiation of bilateral pulmonary arterial thrombolysis COMPARISON:  CT PE study 12/29/2017 MEDICATIONS: None. ANESTHESIA/SEDATION: Versed 1 mg IV; Fentanyl 50 mcg IV Moderate Sedation Time:  39 minutes The patient was continuously monitored during the procedure by the interventional radiology nurse under my direct supervision. FLUOROSCOPY TIME:  Fluoroscopy Time: 11 minutes 0 seconds (122 mGy). COMPLICATIONS: None immediate. TECHNIQUE: Informed written consent was obtained from the patient after a thorough discussion of the procedural risks, benefits and alternatives. All questions were addressed. Maximal Sterile Barrier Technique was utilized including caps, mask, sterile gowns,  sterile gloves, sterile drape, hand hygiene and skin antiseptic. A timeout was performed prior to the initiation of the procedure. The right was interrogated with ultrasound and found to be widely patent. An image was obtained and stored for the medical record. Local anesthesia was attained by infiltration with 1% lidocaine. A small dermatotomy was made. Under real-time sonographic guidance, the vessel was punctured with a 21 gauge micropuncture needle. Using standard technique, the initial micro needle was exchanged over a 0.018 micro wire for a transitional 4 [place] micro sheath. The micro sheath was then exchanged over a 0.035 wire for a 6 French vascular sheath. Using the same technique, a second 6 [place] vascular sheath was also placed into the common femoral vein just lateral to the first. Working through the more medial sheath, a C2 cobra catheter was advanced over a Bentson wire in used to select the main pulmonary artery. A main pulmonary arteriogram was performed. There is massive enlargement of the pulmonary trunk and central left and right pulmonary arteries with very poor opacification of the lobar pulmonary arteries bilaterally secondary to acute thrombus. The pulmonary arterial pressure was obtained at 53/25 (35) mm Hg. This is consistent with pulmonary arterial hypertension. The C2 cobra catheter was then navigated into the left main pulmonary artery. A left pulmonary arteriogram was performed. Occlusive thrombus is present throughout the left lower and upper lobe pulmonary arteries. Using a glidewire, the catheter was successfully navigated into the distal aspect of the left lower lobe pulmonary artery. The glidewire was then exchanged for a Rosen wire. The 5 French catheter was removed and a 12 cm infusion length EKOS multi sidehole ultrasound assisted infusion catheter was advanced over the wire and positioned in the left lower lobe pulmonary artery. The C2 cobra catheter was then advanced through  the more lateral sheath and navigated into the right main pulmonary artery. A right main pulmonary arteriogram was performed again demonstrating occlusive thrombus within the right upper, middle and lower lobe pulmonary arteries.  The catheter was successfully navigated over a Glidewire into the right lower lobe pulmonary artery. The glidewire was then exchanged for a rose in wire. The 5 French catheter was removed and a second 12 cm infusion length EKOS multi sidehole ultrasound assisted infusion catheter was advanced over the wire and positioned in the right lower lobe pulmonary artery. The infusion catheters were connected to tPA and bilateral pulmonary artery arterial thrombolysis was initiated at a rate of 1 milligram/hour per catheter for a total of 2 milligrams/hour. The sheaths were secured with 0 Prolene suture. The patient tolerated the procedure well. FINDINGS: Main PAP: 53/25 (35) mm Hg IMPRESSION: 1. Suspect acute on chronic pulmonary artery hypertension. The pulmonary trunk and main pulmonary arteries are massive suggesting underlying chronic pulmonary arterial hypertension. 2. Occlusive or near occlusive thrombus present within the lobar arteries of all lobes of both lungs. 3. Successful initiation of bilateral pulmonary artery thrombolysis using ultrasound assisted multi side-hole infusion catheters (EKOS). Signed, Criselda Peaches, MD [facility] Radiology Electronically Signed   By: Jacqulynn Cadet M.D.   On: 12/29/2017 11:37   Ir Jacolyn Reedy F/u Elizabeth Sauer Art/ven Final Day (ms)  Result Date: 12/30/2017 INDICATION: Acute bilateral sub massive pulmonary emboli, status post PE lysis EXAM: PE THROMBO LYSIS FOLLOW-UP, FINAL DAY COMPARISON:  12/29/2017 MEDICATIONS: None. ANESTHESIA/SEDATION: NONE. The patient was continuously monitored during the procedure by the interventional radiology nurse under my direct supervision. FLUOROSCOPY TIME:  Fluoroscopy  Time: None. COMPLICATIONS: None immediate. TECHNIQUE: At the bedside, central pulmonary artery pressure measurements obtained. Right femoral access removed. Hemostasis obtained with manual compression. No immediate complication. Patient tolerated the procedure well. FINDINGS: Post lysis PA pressure: 35/15 mm Hg (23) compared to a pretreatment pressure of 53/25 mm Hg (35) IMPRESSION: Completion of the PE lysis protocol with improvement in the PA pressures as above. Electronically Signed   By: Jerilynn Mages.  Shick M.D.   On: 12/30/2017 11:07    (Echo, Carotid, EGD, Colonoscopy, ERCP)    Subjective:   Discharge Exam: Vitals:   01/02/18 2340 01/03/18 0757  BP: 123/61 (!) 134/58  Pulse: 74 71  Resp: 18 20  Temp: 98.4 F (36.9 C) 98.4 F (36.9 C)  SpO2: 99% 98%   Vitals:   01/02/18 1700 01/02/18 2340 01/03/18 0657 01/03/18 0757  BP: 110/78 123/61  (!) 134/58  Pulse: 68 74  71  Resp: (!) 21 18  20   Temp: 98.2 F (36.8 C) 98.4 F (36.9 C)  98.4 F (36.9 C)  TempSrc: Oral Oral  Oral  SpO2: 98% 99%  98%  Weight:   (!) 139 kg (306 lb 7 oz)   Height:        General: Pt is alert, awake, not in acute distress Cardiovascular: RRR, S1/S2 +, no rubs, no gallops Respiratory: CTA bilaterally, no wheezing, no rhonchi Abdominal: Soft, NT, ND, bowel sounds + Extremities: no edema, no cyanosis    The results of significant diagnostics from this hospitalization (including imaging, microbiology, ancillary and laboratory) are listed below for reference.     Microbiology: Recent Results (from the past 240 hour(s))  MRSA PCR Screening     Status: None   Collection Time: 12/29/17 12:42 PM  Result Value Ref Range Status   MRSA by PCR NEGATIVE NEGATIVE Final    Comment:        The GeneXpert MRSA Assay (FDA approved for NASAL specimens only), is one component of a comprehensive MRSA colonization surveillance program. It is not intended  to diagnose MRSA infection nor to guide or monitor treatment  for MRSA infections. Performed at [facility], [place] [address] [place],  [address]      Labs: BNP (last 3 results) Recent Labs    12/28/17 2126  BNP 440.1*   Basic Metabolic Panel: Recent Labs  Lab 12/29/17 0442 12/30/17 0317 12/30/17 1950 12/31/17 0250 01/01/18 0219 01/02/18 1443  NA 139 137 141 137 137 140  K 3.6 2.9* 5.4* 3.5 3.8 3.8  CL 102 107 112* 107 105 108  CO2 26 23 18* 23 24 24   GLUCOSE 112* 104* 138* 109* 124* 124*  BUN 11 6 7  <5* 7 5*  CREATININE 0.90 0.72 0.80 0.69 0.79 0.87  CALCIUM 9.2 8.1* 9.1 8.7* 8.7* 8.9  MG 2.0 1.8  --   --   --  2.1  PHOS 2.2* 2.6  --   --   --   --    Liver Function Tests: Recent Labs  Lab 12/28/17 2126  AST 33  ALT 11*  ALKPHOS 63  BILITOT 1.2  PROT 8.0  ALBUMIN 3.6   No results for input(s): LIPASE, AMYLASE in the last 168 hours. No results for input(s): AMMONIA in the last 168 hours. CBC: Recent Labs  Lab 12/28/17 2126  12/30/17 0317 12/30/17 1517 12/31/17 0250 01/01/18 0219 01/02/18 2057  WBC 6.9   < > 5.5 5.8 4.7 4.4 3.2*  NEUTROABS 5.0  --   --   --   --  2.2 1.5*  HGB 12.4   < > 10.1* 10.3* 9.6* 9.9* 11.0*  HCT 36.9   < > 30.5* 32.1* 29.4* 30.8* 33.4*  MCV 96.3   < > 97.4 97.9 98.7 98.4 97.7  PLT 161   < > 101* 77* 105* 131* 129*   < > = values in this interval not displayed.   Cardiac Enzymes: No results for input(s): CKTOTAL, CKMB, CKMBINDEX, TROPONINI in the last 168 hours. BNP: Invalid input(s): POCBNP CBG: Recent Labs  Lab 01/02/18 0423 01/02/18 0740 01/02/18 1113 01/02/18 1656 01/02/18 2004  GLUCAP 105* 101* 125* 99 116*   D-Dimer No results for input(s): DDIMER in the last 72 hours. Hgb A1c No results for input(s): HGBA1C in the last 72 hours. Lipid Profile No results for input(s): CHOL, HDL, LDLCALC, TRIG, CHOLHDL, LDLDIRECT in the last 72 hours. Thyroid function studies No results for input(s): TSH, T4TOTAL, T3FREE, THYROIDAB in the last 72 hours.  Invalid  input(s): FREET3 Anemia work up No results for input(s): VITAMINB12, FOLATE, FERRITIN, TIBC, IRON, RETICCTPCT in the last 72 hours. Urinalysis    Component Value Date/Time   COLORURINE STRAW (A) 12/28/2017 0242   APPEARANCEUR CLEAR 12/28/2017 0242   LABSPEC 1.004 (L) 12/28/2017 0242   PHURINE 7.0 12/28/2017 0242   GLUCOSEU NEGATIVE 12/28/2017 0242   HGBUR SMALL (A) 12/28/2017 0242   BILIRUBINUR NEGATIVE 12/28/2017 0242   KETONESUR NEGATIVE 12/28/2017 0242   PROTEINUR NEGATIVE 12/28/2017 0242   NITRITE NEGATIVE 12/28/2017 0242   LEUKOCYTESUR NEGATIVE 12/28/2017 0242   Sepsis Labs Invalid input(s): PROCALCITONIN,  WBC,  LACTICIDVEN Microbiology Recent Results (from the past 240 hour(s))  MRSA PCR Screening     Status: None   Collection Time: 12/29/17 12:42 PM  Result Value Ref Range Status   MRSA by PCR NEGATIVE NEGATIVE Final    Comment:        The GeneXpert MRSA Assay (FDA approved for NASAL specimens only), is one component of a comprehensive MRSA colonization surveillance  program. It is not intended to diagnose MRSA infection nor to guide or monitor treatment for MRSA infections. Performed at [facility], [place] [address] [place], [place] [address]      Time coordinating discharge: Over 38 minutes  SIGNED:   Georgette Shell, MD  [facility] 01/03/2018, 10:21 AM Pager   If 7PM-7AM, please contact night-coverage [email or web address] Password [email or web address]

## 2018-01-03 NOTE — Telephone Encounter (Signed)
Patient notified of sleep study scheduled for 01/08/18. Sleep lab contact information provided to patient to reference in case she has further questions.

## 2018-01-03 NOTE — Progress Notes (Signed)
PT Cancellation Note  Patient Details Name: Sheila Jacobs MRN: [id number] DOB: [birth year]   Cancelled Treatment:    Reason Eval/Treat Not Completed: Other (comment)(refused due to d/c home per pt)   Denice Paradise 01/03/2018, 1:41 PM  [facility] [phone] (pager)

## 2018-01-08 ENCOUNTER — Ambulatory Visit (HOSPITAL_BASED_OUTPATIENT_CLINIC_OR_DEPARTMENT_OTHER): Payer: Medicare Other | Attending: Cardiology | Admitting: Cardiology

## 2018-01-08 VITALS — Ht 63.0 in | Wt 290.0 lb

## 2018-01-08 DIAGNOSIS — G4733 Obstructive sleep apnea (adult) (pediatric): Secondary | ICD-10-CM | POA: Diagnosis not present

## 2018-01-08 DIAGNOSIS — R001 Bradycardia, unspecified: Secondary | ICD-10-CM

## 2018-01-09 ENCOUNTER — Telehealth: Payer: Self-pay | Admitting: Cardiovascular Disease

## 2018-01-09 NOTE — Telephone Encounter (Signed)
Returned call to patient.Advised I will send message to our sleep coordinator.Advised she will call back tomorrow.

## 2018-01-09 NOTE — Telephone Encounter (Signed)
New Message  Pt wants to know when she needs to start using her cpap machine. Please call

## 2018-01-10 DIAGNOSIS — R269 Unspecified abnormalities of gait and mobility: Secondary | ICD-10-CM | POA: Diagnosis not present

## 2018-01-10 DIAGNOSIS — M6281 Muscle weakness (generalized): Secondary | ICD-10-CM | POA: Diagnosis not present

## 2018-01-10 DIAGNOSIS — Z86718 Personal history of other venous thrombosis and embolism: Secondary | ICD-10-CM | POA: Diagnosis not present

## 2018-01-10 DIAGNOSIS — I2699 Other pulmonary embolism without acute cor pulmonale: Secondary | ICD-10-CM | POA: Diagnosis not present

## 2018-01-10 NOTE — Telephone Encounter (Signed)
Left message that it will be at least 4-5 more weeks. The study has to be scored and entered into the system. Then it has to be read by the MD. Once this has been done it will be referred to the [employer] company who then has to get benefits and approval from her insurance company. Once all of this has been done she will be contacted by the [employer] company for set up. She was advised to return a call to me if she has any further questions or concerns. Ok per dpr to leave message on answering machine.

## 2018-01-14 DIAGNOSIS — I2699 Other pulmonary embolism without acute cor pulmonale: Secondary | ICD-10-CM | POA: Diagnosis not present

## 2018-01-14 DIAGNOSIS — Z86718 Personal history of other venous thrombosis and embolism: Secondary | ICD-10-CM | POA: Diagnosis not present

## 2018-01-14 DIAGNOSIS — M6281 Muscle weakness (generalized): Secondary | ICD-10-CM | POA: Diagnosis not present

## 2018-01-14 DIAGNOSIS — R269 Unspecified abnormalities of gait and mobility: Secondary | ICD-10-CM | POA: Diagnosis not present

## 2018-01-17 DIAGNOSIS — I2699 Other pulmonary embolism without acute cor pulmonale: Secondary | ICD-10-CM | POA: Diagnosis not present

## 2018-01-17 DIAGNOSIS — R269 Unspecified abnormalities of gait and mobility: Secondary | ICD-10-CM | POA: Diagnosis not present

## 2018-01-17 DIAGNOSIS — Z86718 Personal history of other venous thrombosis and embolism: Secondary | ICD-10-CM | POA: Diagnosis not present

## 2018-01-17 DIAGNOSIS — M6281 Muscle weakness (generalized): Secondary | ICD-10-CM | POA: Diagnosis not present

## 2018-01-19 NOTE — Procedures (Signed)
Patient Name: Sheila Jacobs, Sheila Jacobs Date: 01/08/2018 Gender: Female D.O.B: [birth year] Age (years): 57 Referring Provider: Reino Bellis NP Height (inches): 56 Interpreting Physician: Fransico Him MD, ABSM Weight (lbs): 290 RPSGT: Zadie Rhine BMI: 51 MRN: [id number] Neck Size: 16.50  CLINICAL INFORMATION Sleep Study Type: Split Night CPAP  Indication for sleep study: N/A  Epworth Sleepiness Score: 2  SLEEP STUDY TECHNIQUE As per the AASM Manual for the Scoring of Sleep and Associated Events v2.3 (April 2016) with a hypopnea requiring 4% desaturations.  The channels recorded and monitored were frontal, central and occipital EEG, electrooculogram (EOG), submentalis EMG (chin), nasal and oral airflow, thoracic and abdominal wall motion, anterior tibialis EMG, snore microphone, electrocardiogram, and pulse oximetry. Continuous positive airway pressure (CPAP) was initiated when the patient met split night criteria and was titrated according to treat sleep-disordered breathing.  MEDICATIONS Medications self-administered by patient taken the night of the study : N/A  RESPIRATORY PARAMETERS Diagnostic Total AHI (/hr): 30.1  RDI (/hr):36.2  OA Index (/hr): 4.4  CA Index (/hr): 0.0 REM AHI (/hr): 56.7  NREM AHI (/hr):24.9  Supine AHI (/hr):30.1  Non-supine AHI (/hr):N/A Min O2 Sat (%):80.0  Mean O2 (%): 93.8  Time below 88% (min):2.7   Titration Optimal Pressure (cm):15  AHI at Optimal Pressure (/hr):0.0  Min O2 at Optimal Pressure (%):92.0 Supine % at Optimal (%):100  Sleep % at Optimal (%):79   SLEEP ARCHITECTURE The recording time for the entire night was 351.8 minutes.  During a baseline period of 142.2 minutes, the patient slept for 109.5 minutes in REM and nonREM, yielding a sleep efficiency of 77.0%%. Sleep onset after lights out was 31.5 minutes with a REM latency of 92.5 minutes. The patient spent 1.4%% of the night in stage N1 sleep, 80.8%% in stage N2  sleep, 1.4%% in stage N3 and 16.4%% in REM.  During the titration period of 201.7 minutes, the patient slept for 156.5 minutes in REM and nonREM, yielding a sleep efficiency of 77.6%%. Sleep onset after CPAP initiation was 20.9 minutes with a REM latency of 64.0 minutes. The patient spent 3.5%% of the night in stage N1 sleep, 75.1%% in stage N2 sleep, 0.0%% in stage N3 and 21.4%% in REM.  CARDIAC DATA The 2 lead EKG demonstrated sinus rhythm. The mean heart rate was 100.0 beats per minute. Other EKG findings include: None.  LEG MOVEMENT DATA The total Periodic Limb Movements of Sleep (PLMS) were 0. The PLMS index was 0.0 .  IMPRESSIONS - Severe obstructive sleep apnea occurred during the diagnostic portion of the study (AHI = 30.1/hour). An optimal PAP pressure was selected for this patient ( 15 cm of water) - No significant central sleep apnea occurred during the diagnostic portion of the study (CAI = 0.0/hour). - Moderate oxygen desaturation was noted during the diagnostic portion of the study (Min O2 =80.0%). - No snoring was audible during the diagnostic portion of the study. - No cardiac abnormalities were noted during this study. - Clinically significant periodic limb movements did not occur during sleep.  DIAGNOSIS - Obstructive Sleep Apnea (327.23 [G47.33 ICD-10])  RECOMMENDATIONS - Trial of CPAP therapy on 15 cm H2O with a Large size Fisher&Paykel Full Face Mask Simplus mask and heated humidification. - Avoid alcohol, sedatives and other CNS depressants that may worsen sleep apnea and disrupt normal sleep architecture. - Sleep hygiene should be reviewed to assess factors that may improve sleep quality. - Weight management and regular exercise should be initiated or continued. - Return  to Sleep Center for re-evaluation after 10 weeks of therapy  [Electronically signed] 01/19/2018 03:06 PM  Fransico Him MD, ABSM Diplomate, American Board of Sleep Medicine

## 2018-01-20 DIAGNOSIS — I2699 Other pulmonary embolism without acute cor pulmonale: Secondary | ICD-10-CM | POA: Diagnosis not present

## 2018-01-20 DIAGNOSIS — Z86718 Personal history of other venous thrombosis and embolism: Secondary | ICD-10-CM | POA: Diagnosis not present

## 2018-01-20 DIAGNOSIS — M6281 Muscle weakness (generalized): Secondary | ICD-10-CM | POA: Diagnosis not present

## 2018-01-20 DIAGNOSIS — R269 Unspecified abnormalities of gait and mobility: Secondary | ICD-10-CM | POA: Diagnosis not present

## 2018-01-21 NOTE — Progress Notes (Signed)
Spoke with pt letting her know that Dr Claiborne Billings have not read the sleep study report and our office will call back as soon as we get the result, pt voice understanding.Marland Kitchen

## 2018-01-22 ENCOUNTER — Encounter: Payer: Self-pay | Admitting: Physician Assistant

## 2018-01-23 ENCOUNTER — Encounter: Payer: Self-pay | Admitting: Physician Assistant

## 2018-01-23 ENCOUNTER — Telehealth: Payer: Self-pay | Admitting: *Deleted

## 2018-01-23 ENCOUNTER — Telehealth: Payer: Self-pay

## 2018-01-23 ENCOUNTER — Ambulatory Visit (INDEPENDENT_AMBULATORY_CARE_PROVIDER_SITE_OTHER): Payer: Medicare Other | Admitting: Physician Assistant

## 2018-01-23 VITALS — BP 130/60 | HR 78 | Ht 63.0 in | Wt 303.2 lb

## 2018-01-23 DIAGNOSIS — I2692 Saddle embolus of pulmonary artery without acute cor pulmonale: Secondary | ICD-10-CM

## 2018-01-23 DIAGNOSIS — R609 Edema, unspecified: Secondary | ICD-10-CM | POA: Diagnosis not present

## 2018-01-23 DIAGNOSIS — G4733 Obstructive sleep apnea (adult) (pediatric): Secondary | ICD-10-CM

## 2018-01-23 HISTORY — DX: Obstructive sleep apnea (adult) (pediatric): G47.33

## 2018-01-23 NOTE — Telephone Encounter (Signed)
Patient because Suanne Marker wanted patient to start to Lasix 20mg  qd; called patient and she stated she was not home and she would give me a call back later.

## 2018-01-23 NOTE — Progress Notes (Signed)
Cardiology Office Note   Date:  01/23/2018   ID:  Sheila Jacobs, DOB April 19, 1951, MRN [id number]  PCP:  Velna Hatchet, MD  Cardiologist: Dr. Mardene Celeste, PA-C   Chief Complaint  Patient presents with  . Hospitalization Follow-up    slight chest pains comes and goes, denies SOB, noted swelling in hands/feet    History of Present Illness: Sheila Jacobs is a 67 y.o. female with a history of morbid obesity, HTN, HLD, ongoing tobacco, breast CA, GERD,   Admitted 4/20-4/26/2019 for shortness of breath>> bilateral saddle PE>>lysis, card soft for asymptomatic bradycardia, 2.6-second pause while asleep, probable OSA, follow-up echo in 6 weeks 01/08/2018 sleep study with severe OSA, CPAP recommended  Sheila Jacobs presents for cardiology follow-up.  She feels that her breathing is a little bit better.  She is not watching the sodium and what she eats.  She is not weighing herself daily.  Ambulation is limited, she walks with a cane.  She has swelling in her legs every day, but does not think she wakes up with it.  She has pain in her feet and ankles.  It gets better when she gets up and moves around some but is still there.  She had the sleep study, but is not sure she wants to wear CPAP every night.  She has a mask, she went home with it from the hospital.  She is not sure that the CPAP machine is going to help her.  She denies orthopnea or PND.  She admits that she watches TV until after 1 AM every day and then goes to sleep.  However, she thinks that she sleeps pretty well.  She is not having chest pain.  She is not having palpitations, denies presyncope or syncope.   Past Medical History:  Diagnosis Date  . Anginal pain ([place])   . Anxiety   . Arthritis   . Breast cancer ([place])   . Breast cancer of upper-outer quadrant of left female breast ([place]) 01/31/2016  . Colon polyps   . Enlarged LA (left atrium)   . GERD (gastroesophageal reflux disease)   .  Hard of hearing    Wears hearing aids.  . Headache   . Heavy cigarette smoker    Quit 2015  . History of radiation therapy 06/18/16- 07/30/16   Left Breast 45 Gy in 25 fractions, Left Breast Boost 6 Gy in 3 fractions.   . Hyperlipidemia   . Hypertension   . Morbid obesity ([place])   . Numbness and tingling   . Obesity   . Thyroid goiter   . Vertigo    on occasion    Past Surgical History:  Procedure Laterality Date  . BREAST LUMPECTOMY Left 2017   radiation  . BREAST LUMPECTOMY WITH RADIOACTIVE SEED AND SENTINEL LYMPH NODE BIOPSY Left 04/11/2016   Procedure: BREAST LUMPECTOMY WITH RADIOACTIVE SEED AND SENTINEL LYMPH NODE BIOPSY;  Surgeon: Excell Seltzer, MD;  Location: [facility];  Service: General;  Laterality: Left;  . COLONOSCOPY WITH PROPOFOL N/A 10/20/2015   Procedure: COLONOSCOPY WITH PROPOFOL;  Surgeon: Mauri Pole, MD;  Location: [facility];  Service: Endoscopy;  Laterality: N/A;  . ESOPHAGOGASTRODUODENOSCOPY (EGD) WITH PROPOFOL N/A 10/20/2015   Procedure: ESOPHAGOGASTRODUODENOSCOPY (EGD) WITH PROPOFOL;  Surgeon: Mauri Pole, MD;  Location: [facility];  Service: Endoscopy;  Laterality: N/A;  . IR ANGIOGRAM PULMONARY BILATERAL SELECTIVE  12/29/2017  . IR ANGIOGRAM SELECTIVE EACH ADDITIONAL VESSEL  12/29/2017  . IR ANGIOGRAM  SELECTIVE EACH ADDITIONAL VESSEL  12/29/2017  . IR INFUSION THROMBOL ARTERIAL INITIAL (MS)  12/29/2017  . IR INFUSION THROMBOL ARTERIAL INITIAL (MS)  12/29/2017  . IR THROMB F/U EVAL ART/VEN FINAL DAY (MS)  12/30/2017  . IR US GUIDE VASC ACCESS RIGHT  12/29/2017  . LEFT HEART CATHETERIZATION WITH CORONARY ANGIOGRAM N/A 10/21/2014   Procedure: LEFT HEART CATHETERIZATION WITH CORONARY ANGIOGRAM;  Surgeon: Burnell Blanks, MD;  Location: [facility] CATH LAB;  Service: Cardiovascular;  Laterality: N/A;  . THYROIDECTOMY N/A 02/26/2017   Procedure: TOTAL THYROIDECTOMY;  Surgeon: Excell Seltzer, MD;  Location: [facility];  Service: General;  Laterality: N/A;  .  TUBAL LIGATION      Current Outpatient Medications  Medication Sig Dispense Refill  . ALPRAZolam (XANAX) 0.25 MG tablet TAKE ONE TABLET BY MOUTH TWICE DAILY AS NEEDED FOR ANXIETY 30 tablet 1  . anastrozole (ARIMIDEX) 1 MG tablet Take 1 tablet (1 mg total) by mouth daily. 90 tablet 3  . aspirin EC 81 MG tablet Take 81 mg by mouth daily.    Marland Kitchen ketoconazole (NIZORAL) 2 % cream APPLY TOPICALLY DAILY AS NEEDED FOR IRRITATION 30 g 0  . levothyroxine (SYNTHROID, LEVOTHROID) 150 MCG tablet Take 1 tablet (150 mcg total) by mouth daily. 30 tablet 3  . pantoprazole (PROTONIX) 40 MG tablet TAKE ONE TABLET BY MOUTH ONCE DAILY 30 tablet 11  . Rivaroxaban 15 & 20 MG TBPK Take as directed on package: Start with one 15mg  tablet by mouth twice a day with food. On Day 22, switch to one 20mg  tablet once a day with food. 51 each 0   No current facility-administered medications for this visit.     Allergies:   Cortisone; Effexor [venlafaxine]; and Lexapro [escitalopram]    Social History:  The patient  reports that she quit smoking about 3 years ago. Her smoking use included cigarettes. She has a 45.00 pack-year smoking history. She has never used smokeless tobacco. She reports that she does not drink alcohol or use drugs.   Family History:  The patient's family history includes Cataracts in her brother and mother; Diabetes in her mother and sister; Hearing loss in her brother and mother; Heart disease in her mother; Hypertension in her mother; Kidney disease in her mother; Liver disease in her mother.    ROS:  Please see the history of present illness. All other systems are reviewed and negative.    PHYSICAL EXAM: VS:  BP 130/60 (BP Location: Right Arm)   Pulse 78   Ht 5\' 3"  (1.6 m)   Wt (!) 303 lb 3.2 oz (137.5 kg)   BMI 53.71 kg/m  , BMI Body mass index is 53.71 kg/m. GEN: Well nourished, well developed, female in no acute distress  HEENT: normal for age  Neck: no JVD seen, difficult to assess  secondary to body habitus, no carotid bruit, no masses Cardiac: RRR; no murmur, no rubs, or gallops Respiratory: Decreased breath sounds bases bilaterally, normal work of breathing GI: soft, nontender, nondistended, + BS MS: no deformity or atrophy; 1-2+ lower extremity edema; distal pulses are 2+ in all 4 extremities   Skin: warm and dry, no rash Neuro:  Strength and sensation are intact Psych: euthymic mood, full affect   EKG:  EKG is not ordered today.  ECHO: 12/29/2017 - Left ventricle: Septal flattening consistent with possible   elevated right sided pressures. The cavity size was normal. Wall   thickness was increased in a pattern of severe LVH. Systolic  function was normal. The estimated ejection fraction was in the   range of 60% to 65%. Wall motion was normal; there were no   regional wall motion abnormalities. Doppler parameters are   consistent with abnormal left ventricular relaxation (grade 1   diastolic dysfunction). - Right ventricle: RV appears dilated and hypokinetic. - Atrial septum: No defect or patent foramen ovale was identified. - Tricuspid valve: There was moderate regurgitation. - Pulmonary arteries: PA peak pressure: 51 mm Hg (S). - Impressions: Findings are concerning for acute pulmonary embolus.  Impressions:  - Findings are concerning for acute pulmonary embolus.  Recent Labs: 12/28/2017: ALT 11; B Natriuretic Peptide 592.6; TSH 12.472 01/02/2018: BUN 5; Creatinine, Ser 0.87; Hemoglobin 11.0; Magnesium 2.1; Platelets 129; Potassium 3.8; Sodium 140    Lipid Panel    Component Value Date/Time   CHOL 168 08/09/2016 1016   TRIG 39 08/09/2016 1016   HDL 65 08/09/2016 1016   CHOLHDL 2.6 08/09/2016 1016   VLDL 8 08/09/2016 1016   LDLCALC 95 08/09/2016 1016     Wt Readings from Last 3 Encounters:  01/23/18 (!) 303 lb 3.2 oz (137.5 kg)  01/08/18 290 lb (131.5 kg)  01/03/18 (!) 306 lb 7 oz (139 kg)     Other studies Reviewed: Additional  studies/ records that were reviewed today include: Notes, Hospital records and testing.  ASSESSMENT AND PLAN:  1.  Bilateral PE: She is encouraged to be compliant with the Xarelto and increase her activity as tolerated.  Per the patient, her shortness of breath is improved.  2.  Edema, Volume overload: She has some lower extremity edema.  She says that she does not wake with it, but I am not sure.  I think she would benefit from Lasix 20 mg daily.  She has an appointment coming up with Dr. Dwyane Dee, hopefully she can get a BMET at that time.  The decision on Lasix was made after the patient had left, the nursing staff is trying to get in touch with her to let her know about it.  3.  OSA: Ms Saverio Danker was in to speak with the patient and will proceed with the process of getting her CPAP.  She is encouraged to use it when she gets it and hopefully will see benefit from it which will help with compliance.   Current medicines are reviewed at length with the patient today.  The patient does not have concerns regarding medicines.  The following changes have been made: Add Lasix 20 mg daily, BMET in 2 weeks or so  Labs/ tests ordered today include:  No orders of the defined types were placed in this encounter.  Disposition:   FU with Dr. [name]  Signed, Rosaria Ferries, PA-C  01/23/2018 9:10 AM    [place] Phone: [phone]; Fax: [phone]  This note was written with the assistance of speech recognition software. Please excuse any transcriptional errors.

## 2018-01-23 NOTE — Telephone Encounter (Signed)
-----   Message from Sueanne Margarita, MD sent at 01/19/2018  3:11 PM EDT ----- Please let patient know that they have significant sleep apnea and had successful PAP titration and will be set up with PAP unit.  Please let DME know that order is in EPIC.  Please set patient up for OV in 10 weeks

## 2018-01-23 NOTE — Telephone Encounter (Signed)
This is the patient that I called you and Tae about this morning. She was here seeing Rhonda. I offered this to her today. She wants to be followed by Dr Radford Pax because [address] is closer to her house.

## 2018-01-23 NOTE — Patient Instructions (Signed)
Medication Instructions:  Your physician recommends that you continue on your current medications as directed. Please refer to the Current Medication list given to you today.  Labwork: None   Testing/Procedures: None   Follow-Up: Your provider recommends that you schedule a follow-up appointment in: 3 months with Dr [name].  Any Other Special Instructions Will Be Listed Below (If Applicable).  If you need a refill on your cardiac medications before your next appointment, please call your pharmacy.

## 2018-01-24 ENCOUNTER — Other Ambulatory Visit: Payer: Self-pay

## 2018-01-24 ENCOUNTER — Ambulatory Visit (INDEPENDENT_AMBULATORY_CARE_PROVIDER_SITE_OTHER): Payer: Medicare Other | Admitting: Family Medicine

## 2018-01-24 ENCOUNTER — Encounter: Payer: Self-pay | Admitting: Family Medicine

## 2018-01-24 VITALS — BP 126/60 | HR 75 | Temp 98.1°F | Ht 63.0 in | Wt 303.4 lb

## 2018-01-24 DIAGNOSIS — R609 Edema, unspecified: Secondary | ICD-10-CM

## 2018-01-24 DIAGNOSIS — Z7689 Persons encountering health services in other specified circumstances: Secondary | ICD-10-CM

## 2018-01-24 DIAGNOSIS — I2692 Saddle embolus of pulmonary artery without acute cor pulmonale: Secondary | ICD-10-CM

## 2018-01-24 MED ORDER — RIVAROXABAN 20 MG PO TABS: 20.0000 mg | ORAL_TABLET | Freq: Every day | ORAL | 4 refills | Status: DC

## 2018-01-24 MED ORDER — LEVOTHYROXINE SODIUM 150 MCG PO TABS: 150.0000 ug | ORAL_TABLET | Freq: Every day | ORAL | 3 refills | Status: DC

## 2018-01-24 MED ORDER — KETOCONAZOLE 2 % EX CREA: TOPICAL_CREAM | Freq: Every day | CUTANEOUS | 0 refills | Status: DC | PRN

## 2018-01-24 MED ORDER — FUROSEMIDE 20 MG PO TABS: 20.0000 mg | ORAL_TABLET | Freq: Every day | ORAL | 3 refills | Status: DC

## 2018-01-24 NOTE — Patient Instructions (Signed)
It was nice meeting you today Ms. Sheila Jacobs!  For your Xarelto, please finish the pack you are currently on and continue taking 20 mg per day (refills have been sent in for 6 months.)  For your ankle swelling, I am starting you on Lasix 20 mg.  Cardiology wanted to start this medication yesterday.  Please follow up with your cardiologist in a few weeks for them to test your electrolytes.  I am refilling your synthroid and hand cream.  If you need more Xanax in the future, please call the clinic.  I would like to see you again in one month to check on how you are doing.  If you have any questions or concerns, please feel free to call the clinic.   Be well,  Dr. Shan Levans

## 2018-01-24 NOTE — Progress Notes (Signed)
Subjective:    Sheila Jacobs - 67 y.o. female MRN [id number]  Date of birth: 11/20/1950  HPI  Sheila Jacobs is here to establish care.  She has pain on the L side of her chest.  Breathing deeply, drinking water, and taking an ASA helps the pain.  The pain comes and goes but is not correlated with activity.  She also has swollen feet.  She thinks her thyroid medication is causing her throat to be sore and her fingers to be swollen.  PMH: L breast cancer in 2017 treated with lumpectomy, radiation, currently on anastrozole therapy, bilateral saddle pulmonary emboli in April 2019, tobacco use disorder, post-surgical hypothyroidism, hypertension, anxiety, hearing difficulty   PSH: lumpectomy, tubal ligation  Meds: pantoprazole, synthroid 150 mcg daily, vitamin D3, HCTZ 25 mg, ketoconazole cream, ASA 81 mg, anastrazole 1 mg daily, xanax 0.25 mg PRN for anxiety (takes every couple weeks)  Social history: lives with daughter while she is recovering from her PE, quit smoking three years ago  Health Maintenance:  Health Maintenance Due  Topic Date Due  . DEXA SCAN  11/16/2013  . PNA vac Low Risk Adult (2 of 2 - PPSV23) 08/09/2016    -  reports that she quit smoking about 3 years ago. Her smoking use included cigarettes. She has a 45.00 pack-year smoking history. She has never used smokeless tobacco. - Review of Systems: Per HPI. - Past Medical History: Patient Active Problem List   Diagnosis Date Noted  . Encounter to establish care 01/26/2018  . OSA (obstructive sleep apnea) 01/23/2018  . Hypothyroidism   . Bradycardia   . Hypoxia   . Acute saddle pulmonary embolism without acute cor pulmonale ([place]) 12/29/2017  . History of breast cancer 10/23/2017  . Post-surgical hypothyroidism 06/27/2017  . Thyroid goiter 02/26/2017  . Palpitations 10/16/2016  . Personal history of noncompliance with medical treatment, presenting hazards to health 08/27/2016  . Generalized anxiety disorder  08/09/2016  . Abnormal TSH 05/09/2016  . Paresthesia 01/31/2016  . Abnormality of gait 01/31/2016  . Low back pain 01/31/2016  . Breast cancer of upper-outer quadrant of left female breast ([place]) 01/31/2016  . Former heavy cigarette smoker (20-39 per day) 11/08/2015  . Goiter 11/08/2015  . Esophageal reflux   . Special screening for malignant neoplasms, colon   . Hearing loss 10/07/2015  . GERD (gastroesophageal reflux disease) 05/03/2015  . Dependent edema 05/03/2015  . Morbid obesity ([place]) 04/29/2015  . Right carotid bruit 10/06/2014  . Hypokalemia   . Hypomagnesemia   . Atypical chest pain 09/18/2014  . Hypertension 09/18/2014  . Hyperlipidemia 09/18/2014  . Enlarged LA (left atrium)   . Chest pain, precordial 09/02/2014   - Medications: reviewed and updated   Objective:   Physical Exam BP 126/60   Pulse 75   Temp 98.1 F (36.7 C) (Oral)   Ht 5\' 3"  (1.6 m)   Wt (!) 303 lb 6.4 oz (137.6 kg)   SpO2 97%   BMI 53.74 kg/m  Gen: NAD, alert, cooperative with exam, obese HEENT: NCAT, clear conjunctiva, supple neck CV: RRR, good S1/S2, no murmur, 2+ pitting edema on bilateral feet and ankles, chest pain nonreproducible  Resp: CTABL, no wheezes, non-labored Abd: SNTND, BS present, no guarding or organomegaly Skin: no rashes, normal turgor  Neuro: no gross deficits.      Assessment & Plan:   Acute saddle pulmonary embolism without acute cor pulmonale (HCC) Reviewed patient's regimen of Xarelto with her.  Prescribed  5 months of 20 mg daily Xarelto.  Patient expressed understanding of the importance of taking this medication at least for the full six months to prevent another pulmonary embolism.  Patient's chest pain does not seem consistent with angina since it is not associated with activity or diaphoresis, is not severe, and is a chronic problem for her.  Could be a sequelae from her PE.  Return precautions were given.  I would like to see her back in 1-2 months to monitor her  symptoms and adherence to therapy.    Dependent edema Will prescribe Lasix 20 mg daily since this medicine was meant to be prescribed for patient according to office notes from her Cardiology appointment on 01/23/18.  Told patient that she will need to get labs checked with Cardiology in 2 weeks to check electrolytes while on Lasix.  Encounter to establish care Refilled synthroid and clotrimazole.  Patient will need PPSV23 shot and TSH checked at her next visit.    Maia Breslow, M.D. 01/26/2018, 5:21 PM PGY-1, [place]

## 2018-01-24 NOTE — Telephone Encounter (Signed)
Called results LMTCB 

## 2018-01-24 NOTE — Telephone Encounter (Signed)
Patient notified directly-about starting Lasix 20mg  qd, but patient stated that she is taking Hctz 25mg  QD and it was not on her medication list. Informed patient that I would speak with Suanne Marker and get back to her; she voiced understanding.

## 2018-01-24 NOTE — Telephone Encounter (Signed)
Just continue the HCTZ 25 mg. Reinforce the need to make low-sodium food choices and do daily weights.  Thanks

## 2018-01-24 NOTE — Telephone Encounter (Signed)
Left detailed message informing patient of Suanne Marker' recommendations; called pharmacy to d/c Lasix, pharmacist voiced understanding. Hctz added to patients chart.

## 2018-01-26 NOTE — Assessment & Plan Note (Signed)
Refilled synthroid and clotrimazole.  Patient will need PPSV23 shot and TSH checked at her next visit.

## 2018-01-26 NOTE — Assessment & Plan Note (Signed)
Will prescribe Lasix 20 mg daily since this medicine was meant to be prescribed for patient according to office notes from her Cardiology appointment on 01/23/18.  Told patient that she will need to get labs checked with Cardiology in 2 weeks to check electrolytes while on Lasix.

## 2018-01-26 NOTE — Assessment & Plan Note (Addendum)
Reviewed patient's regimen of Xarelto with her.  Prescribed 5 months of 20 mg daily Xarelto.  Patient expressed understanding of the importance of taking this medication at least for the full six months to prevent another pulmonary embolism.  Patient's chest pain does not seem consistent with angina since it is not associated with activity or diaphoresis, is not severe, and is a chronic problem for her.  Could be a sequelae from her PE.  Return precautions were given.  I would like to see her back in 1-2 months to monitor her symptoms and adherence to therapy.

## 2018-01-27 DIAGNOSIS — R269 Unspecified abnormalities of gait and mobility: Secondary | ICD-10-CM | POA: Diagnosis not present

## 2018-01-27 DIAGNOSIS — M6281 Muscle weakness (generalized): Secondary | ICD-10-CM | POA: Diagnosis not present

## 2018-01-27 DIAGNOSIS — Z86718 Personal history of other venous thrombosis and embolism: Secondary | ICD-10-CM | POA: Diagnosis not present

## 2018-01-27 DIAGNOSIS — I2699 Other pulmonary embolism without acute cor pulmonale: Secondary | ICD-10-CM | POA: Diagnosis not present

## 2018-01-27 NOTE — Telephone Encounter (Signed)
Called results LMTCB 

## 2018-01-28 ENCOUNTER — Telehealth: Payer: Self-pay | Admitting: Cardiology

## 2018-01-28 NOTE — Telephone Encounter (Signed)
New Message: ° ° ° ° ° ° °Pt is returning a call °

## 2018-01-30 NOTE — Telephone Encounter (Signed)
Return pt call, pt stated someone call and leave message on her phone and just needed some clarification, review pt chart and explain about medication changes as per Rosaria Ferries, PA

## 2018-02-02 DIAGNOSIS — R269 Unspecified abnormalities of gait and mobility: Secondary | ICD-10-CM | POA: Diagnosis not present

## 2018-02-05 NOTE — Telephone Encounter (Signed)
Informed patient of sleep study results and patient understanding was verbalized. Pt is aware and agreeable to her results being she has significant sleep apnea and had successful PAP titration and will be set up with PAP unit. Upon patient request DME selection is [facility] Patient understands she will be contacted by [facility] to set up her cpap. Patient understands to call if [facility] does not contact her with new setup in a timely manner. Patient understands they will be called once confirmation has been received from [facility] that they have received their new machine to schedule 10 week follow up appointment.  [place] notified of new cpap order  Please add to airview Patient was grateful for the call and thanked me.

## 2018-02-11 ENCOUNTER — Other Ambulatory Visit (INDEPENDENT_AMBULATORY_CARE_PROVIDER_SITE_OTHER): Payer: Medicare Other

## 2018-02-11 DIAGNOSIS — E890 Postprocedural hypothyroidism: Secondary | ICD-10-CM | POA: Diagnosis not present

## 2018-02-11 DIAGNOSIS — I10 Essential (primary) hypertension: Secondary | ICD-10-CM

## 2018-02-11 LAB — TSH: TSH: 3.14 u[IU]/mL (ref 0.35–4.50)

## 2018-02-11 LAB — BASIC METABOLIC PANEL
BUN: 10 mg/dL (ref 6–23)
CALCIUM: 10.1 mg/dL (ref 8.4–10.5)
CO2: 30 mEq/L (ref 19–32)
CREATININE: 0.77 mg/dL (ref 0.40–1.20)
Chloride: 104 mEq/L (ref 96–112)
GFR: 95.52 mL/min (ref >60.00)
Glucose, Bld: 112 mg/dL — ABNORMAL HIGH (ref 70–99)
Potassium: 3.6 mEq/L (ref 3.5–5.1)
Sodium: 140 mEq/L (ref 135–145)

## 2018-02-11 LAB — T4, FREE: Free T4: 1.13 ng/dL (ref 0.60–1.60)

## 2018-02-14 ENCOUNTER — Ambulatory Visit (INDEPENDENT_AMBULATORY_CARE_PROVIDER_SITE_OTHER): Payer: Medicare Other | Admitting: Endocrinology

## 2018-02-14 ENCOUNTER — Encounter: Payer: Self-pay | Admitting: Endocrinology

## 2018-02-14 VITALS — BP 140/70 | HR 88 | Ht 63.0 in | Wt 305.4 lb

## 2018-02-14 DIAGNOSIS — E890 Postprocedural hypothyroidism: Secondary | ICD-10-CM | POA: Diagnosis not present

## 2018-02-14 NOTE — Progress Notes (Signed)
Patient ID: Sheila Jacobs, female   DOB: 17-Dec-1950, 67 y.o.   MRN: [id number]           Reason for Appointment: HYPOTHYROIDISM, follow-up    History of Present Illness:   The patient's thyroid enlargement was first discovered in 2009 probably on her routine physical exam  [facility]   She however does not know when her goiter first started    On her initial evaluation she did have needle biopsy of her large right-sided nodule which was benign  Clinically she was felt to have a colloid goiter, mostly right sided  Because of her local pressure symptoms with choking feeling, coughing she was referred for thyroidectomy which was done in June 2018 This had relieved her local pressure symptoms  RECENT history:  She is currently on 150 mcg of levothyroxine  She had an increase in her dose in 06/2017 and subsequently increased further up to 150 mcg daily in 08/2017  She has complained on each visit that her physical symptoms are related to taking too strong medication and does not understand the need to take thyroid supplementation She is now saying that her finger on the left hand is hurting and swallowing and sometimes hard to open and she think this is from her thyroid medication Also she continues to have some trouble with chronic cough However she has been trying to take her levothyroxine regularly before breakfast consistently Not clear if she was taking only half a tablet prior to her hospitalization in April when she had a high TSH  Her weight appears to fluctuate  Now her TSH is now back to normal     Wt Readings from Last 3 Encounters:  02/14/18 (!) 305 lb 6.4 oz (138.5 kg)  01/24/18 (!) 303 lb 6.4 oz (137.6 kg)  01/23/18 (!) 303 lb 3.2 oz (137.5 kg)      Lab Results  Component Value Date   FREET4 1.13 02/11/2018   FREET4 0.73 08/13/2017   FREET4 1.02 12/10/2016   TSH 3.14 02/11/2018   TSH 12.472 (H) 12/28/2017   TSH 2.985 10/06/2017       Allergies as of 02/14/2018      Reactions   Cortisone Rash   Effexor [venlafaxine] Palpitations   Lexapro [escitalopram] Palpitations   Heart racing      Medication List        Accurate as of 02/14/18 10:14 AM. Always use your most recent med list.          ALPRAZolam 0.25 MG tablet Commonly known as:  XANAX TAKE ONE TABLET BY MOUTH TWICE DAILY AS NEEDED FOR ANXIETY   anastrozole 1 MG tablet Commonly known as:  ARIMIDEX Take 1 tablet (1 mg total) by mouth daily.   aspirin EC 81 MG tablet Take 81 mg by mouth daily.   hydrochlorothiazide 25 MG tablet Commonly known as:  HYDRODIURIL Take 1 tablet by mouth daily.   ketoconazole 2 % cream Commonly known as:  NIZORAL Apply topically daily as needed for irritation.   levothyroxine 150 MCG tablet Commonly known as:  SYNTHROID, LEVOTHROID Take 1 tablet (150 mcg total) by mouth daily.   pantoprazole 40 MG tablet Commonly known as:  PROTONIX TAKE ONE TABLET BY MOUTH ONCE DAILY   Rivaroxaban 15 & 20 MG Tbpk Take as directed on package: Start with one 15mg  tablet by mouth twice a day with food. On Day 22, switch to one 20mg  tablet  once a day with food.   rivaroxaban 20 MG Tabs tablet Commonly known as:  XARELTO Take 1 tablet (20 mg total) by mouth daily with supper.       Allergies:  Allergies  Allergen Reactions  . Cortisone Rash  . Effexor [Venlafaxine] Palpitations  . Lexapro [Escitalopram] Palpitations    Heart racing    Past Medical History:  Diagnosis Date  . Anginal pain ([place])   . Anxiety   . Arthritis   . Breast cancer ([place])   . Breast cancer of upper-outer quadrant of left female breast ([place]) 01/31/2016  . Colon polyps   . Enlarged LA (left atrium)   . GERD (gastroesophageal reflux disease)   . Hard of hearing    Wears hearing aids.  . Headache   . Heavy cigarette smoker    Quit 2015  . History of radiation therapy 06/18/16- 07/30/16   Left Breast 45 Gy in 25  fractions, Left Breast Boost 6 Gy in 3 fractions.   . Hyperlipidemia   . Hypertension   . Morbid obesity ([place])   . Numbness and tingling   . Obesity   . Pulmonary emboli ([place])   . Thyroid goiter   . Vertigo    on occasion    Past Surgical History:  Procedure Laterality Date  . BREAST LUMPECTOMY Left 2017   radiation  . BREAST LUMPECTOMY WITH RADIOACTIVE SEED AND SENTINEL LYMPH NODE BIOPSY Left 04/11/2016   Procedure: BREAST LUMPECTOMY WITH RADIOACTIVE SEED AND SENTINEL LYMPH NODE BIOPSY;  Surgeon: Excell Seltzer, MD;  Location: [facility];  Service: General;  Laterality: Left;  . COLONOSCOPY WITH PROPOFOL N/A 10/20/2015   Procedure: COLONOSCOPY WITH PROPOFOL;  Surgeon: Mauri Pole, MD;  Location: [facility];  Service: Endoscopy;  Laterality: N/A;  . ESOPHAGOGASTRODUODENOSCOPY (EGD) WITH PROPOFOL N/A 10/20/2015   Procedure: ESOPHAGOGASTRODUODENOSCOPY (EGD) WITH PROPOFOL;  Surgeon: Mauri Pole, MD;  Location: [facility];  Service: Endoscopy;  Laterality: N/A;  . IR ANGIOGRAM PULMONARY BILATERAL SELECTIVE  12/29/2017  . IR ANGIOGRAM SELECTIVE EACH ADDITIONAL VESSEL  12/29/2017  . IR ANGIOGRAM SELECTIVE EACH ADDITIONAL VESSEL  12/29/2017  . IR INFUSION THROMBOL ARTERIAL INITIAL (MS)  12/29/2017  . IR INFUSION THROMBOL ARTERIAL INITIAL (MS)  12/29/2017  . IR THROMB F/U EVAL ART/VEN FINAL DAY (MS)  12/30/2017  . IR US GUIDE VASC ACCESS RIGHT  12/29/2017  . LEFT HEART CATHETERIZATION WITH CORONARY ANGIOGRAM N/A 10/21/2014   Procedure: LEFT HEART CATHETERIZATION WITH CORONARY ANGIOGRAM;  Surgeon: Burnell Blanks, MD;  Location: [facility] CATH [facility];  Service: Cardiovascular;  Laterality: N/A;  . THYROIDECTOMY N/A 02/26/2017   Procedure: TOTAL THYROIDECTOMY;  Surgeon: Excell Seltzer, MD;  Location: [facility];  Service: General;  Laterality: N/A;  . TUBAL LIGATION      Family History  Problem Relation Age of Onset  . Hypertension Mother   . Heart disease Mother        Pacemaker  .  Kidney disease Mother   . Cataracts Mother   . Hearing loss Mother   . Diabetes Mother   . Liver disease Mother   . Diabetes Sister   . Cataracts Brother   . Hearing loss Brother   . Colon cancer Neg Hx   . Thyroid disease Neg Hx     Social History:  reports that she quit smoking about 3 years ago. Her smoking use included cigarettes. She has a 45.00 pack-year smoking history. She has never used smokeless tobacco. She reports that she does  not drink alcohol or use drugs.    Review of Systems   Complaining of pain in her left finger joint recently    Hypertension has been managed by PCP  BP Readings from Last 3 Encounters:  02/14/18 140/70  01/24/18 126/60  01/23/18 130/60       Examination:   BP 140/70 (BP Location: Right Arm, Patient Position: Sitting, Cuff Size: Large)   Pulse 88   Ht 5\' 3"  (1.6 m)   Wt (!) 305 lb 6.4 oz (138.5 kg)   SpO2 96%   BMI 54.10 kg/m           She looks well  Biceps reflexes are normal.  No tremor   Assessment/Plan:  Postsurgical hypothyroidism  She is concerned about levothyroxine causing various side effects and has a new complaint on each visit  She is still not understanding the need to take thyroid supplementation since her surgery and the role of thyroid hormone She was explained that this is just a supplement and not a medication that will cause side effects if used properly  Also reassured her that the medication is not excessive in dosage She has a normal TSH and has no unusual fatigue She is trying to take her supplement regularly now although she thinks she was taking a lower dose prior to hospitalization  Trigger finger of the left hand: Offered to refer her to an orthopedic surgeon but she will check with her PCP first  She will follow-up in 4 months to make sure she is consistent with her treatment and TSH is stable  Elayne Snare 02/14/2018

## 2018-02-14 NOTE — Patient Instructions (Signed)
Take 1 tab thyroid before Bfst daily

## 2018-02-18 DIAGNOSIS — G4733 Obstructive sleep apnea (adult) (pediatric): Secondary | ICD-10-CM | POA: Diagnosis not present

## 2018-02-18 DIAGNOSIS — R269 Unspecified abnormalities of gait and mobility: Secondary | ICD-10-CM | POA: Diagnosis not present

## 2018-02-28 ENCOUNTER — Telehealth: Payer: Self-pay | Admitting: Endocrinology

## 2018-02-28 NOTE — Telephone Encounter (Signed)
Patient stated the medication levothyroxine medication is making her head hurt, and feeling dizzy, patient Dr Dwyane Dee will have to put her on a different medication.  Please advise

## 2018-03-01 ENCOUNTER — Other Ambulatory Visit: Payer: Self-pay | Admitting: Family Medicine

## 2018-03-03 NOTE — Telephone Encounter (Signed)
Called and left detailed voicemail with MD message. 

## 2018-03-03 NOTE — Telephone Encounter (Signed)
Please advise 

## 2018-03-03 NOTE — Telephone Encounter (Signed)
Patient needs to understand that levothyroxine cannot make her dizzy or have headaches since her thyroid level is perfectly normal.  She is getting the right amount of thyroid supplement She needs to talk to her PCP about the symptoms

## 2018-03-04 NOTE — Telephone Encounter (Signed)
Patient has a 10 week follow up appointment scheduled for 05/08/18 2019. Patient understands he needs to keep this appointment for insurance compliance. Patient was grateful for the call and thanked me.

## 2018-03-05 DIAGNOSIS — R269 Unspecified abnormalities of gait and mobility: Secondary | ICD-10-CM | POA: Diagnosis not present

## 2018-03-10 DIAGNOSIS — R070 Pain in throat: Secondary | ICD-10-CM | POA: Insufficient documentation

## 2018-03-10 DIAGNOSIS — K219 Gastro-esophageal reflux disease without esophagitis: Secondary | ICD-10-CM | POA: Diagnosis not present

## 2018-03-10 DIAGNOSIS — E049 Nontoxic goiter, unspecified: Secondary | ICD-10-CM | POA: Diagnosis not present

## 2018-03-17 ENCOUNTER — Encounter: Payer: Self-pay | Admitting: Family Medicine

## 2018-03-17 ENCOUNTER — Other Ambulatory Visit: Payer: Self-pay

## 2018-03-17 ENCOUNTER — Ambulatory Visit (INDEPENDENT_AMBULATORY_CARE_PROVIDER_SITE_OTHER): Payer: Medicare Other | Admitting: Family Medicine

## 2018-03-17 DIAGNOSIS — I2692 Saddle embolus of pulmonary artery without acute cor pulmonale: Secondary | ICD-10-CM | POA: Diagnosis not present

## 2018-03-17 NOTE — Progress Notes (Deleted)
    Subjective:  Sheila Jacobs is a 67 y.o. female who presents to the [facility] today with a chief complaint of ***.   HPI:   ***HIST  Objective:  Physical Exam: BP 130/60   Pulse 80   Temp 98.5 F (36.9 C) (Oral)   Wt 297 lb (134.7 kg)   SpO2 93%   BMI 52.61 kg/m   Gen: ***NAD, resting comfortably CV: RRR with no murmurs appreciated Pulm: NWOB, CTAB with no crackles, wheezes, or rhonchi GI: Normal bowel sounds present. Soft, Nontender, Nondistended. MSK: no edema, cyanosis, or clubbing noted Skin: warm, dry Neuro: grossly normal, moves all extremities Psych: Normal affect and thought content  No results found for this or any previous visit (from the past 72 hour(s)).   Assessment/Plan:  No problem-specific Assessment & Plan notes found for this encounter.   Sherene Sires, DO FAMILY MEDICINE RESIDENT - PGY1 03/17/2018 2:04 PM

## 2018-03-17 NOTE — Patient Instructions (Signed)
It was a pleasure to see you today! Thank you for choosing [facility] for your primary care. Sheila Jacobs was seen for medication counseling about her xarelto. Come back to the clinic if you have any new concerns or questions, and go to the emergency room if you have any life threatening symptoms or signs of stroke (difficulty talking, facial drooping, or inability to move some of your limbs).    If we did any lab work today that did not result today, one of two things will happen.  1. If everything is normal, you will get a letter in mail sent to the address in your chart with the results for your records.  It is important to keep your address up to date as that is where we will send results.  2. If the results require some sort of discussion, my nurses or myself will call you on the phone number listed in your records.  It is important to keep your phone number up to date in our system as this is how we will try to reach you.  If we cannot reach you on the phone, we will try to send you a letter in the mail so please enable to voicemail function of your phone.  If you don't hear from [place] in two weeks, please give [place] a call to verify your results. Otherwise, we look forward to seeing you again at your next visit. If you have any questions or concerns before then, please call the clinic at [phone].   Please bring all your medications to every doctors visit   Sign up for My Chart to have easy access to your labs results, and communication with your Primary care physician.     Please check-out at the front desk before leaving the clinic.     Best,  Dr. Sherene Sires FAMILY MEDICINE RESIDENT - PGY1 03/17/2018 2:13 PM

## 2018-03-17 NOTE — Progress Notes (Signed)
    Subjective:  Sheila Jacobs is a 67 y.o. female who presents to the [facility] today with a chief complaint of medication discussion regarding her xarelto.   HPI:   Patient with hx of bilateral saddle PE in April 2019. Was started on xarelto.  She presets today to discuss her concerns that xarelto is causing a rash on her arm and intermittent itching on the right side of scalp, she wants to know if that means she is having a stroke.  She says she has been taking the medication as prescribed, was told she's going to be on it until Oct and would like to be off it sooner.  She denies facial droop, numbness, tingling, slurring of speech, or changes in vision  Objective:  Physical Exam: BP 130/60   Pulse 80   Temp 98.5 F (36.9 C) (Oral)   Wt 297 lb (134.7 kg)   SpO2 93%   BMI 52.61 kg/m   Gen: NAD, anxious, pleasant, obese *hearing imparied bilaterally but has one hearing aid CV: RRR with no murmurs appreciated, slight edema in LE bilaterally Pulm: NWOB, CTAB with no crackles, wheezes, or rhonchi GI: Normal bowel sounds present. Soft, Nontender, Nondistended. MSK: no edema, cyanosis, or clubbing noted Skin: warm, dry, mild heat rash vs contact dermatitis on upper right arm, no symptoms indicating DVT/VTE Neuro: CN exam normal without deficits aside from hearing (done due to patient asking if she is having a stroke, no indication of stroke) Psych: Normal affect and thought content  No results found for this or any previous visit (from the past 72 hour(s)).   Assessment/Plan:  Acute saddle pulmonary embolism without acute cor pulmonale (HCC) Patient wanted to know if symptoms of minimal arm rash (likely heat reaction vs contact dermatitis) or scalp itchiness (no lesions) could be stroke symptoms indicating she needs to stop xarelto.   CN exam was normal and none of these symptoms are indications to stop xarelto.  Patient encouraged to continue and agrees.   Sherene Sires, DO FAMILY  MEDICINE RESIDENT - PGY1 03/19/2018 9:32 AM

## 2018-03-19 NOTE — Assessment & Plan Note (Signed)
Patient wanted to know if symptoms of minimal arm rash (likely heat reaction vs contact dermatitis) or scalp itchiness (no lesions) could be stroke symptoms indicating she needs to stop xarelto.   CN exam was normal and none of these symptoms are indications to stop xarelto.  Patient encouraged to continue and agrees.

## 2018-03-20 DIAGNOSIS — G4733 Obstructive sleep apnea (adult) (pediatric): Secondary | ICD-10-CM | POA: Diagnosis not present

## 2018-03-28 ENCOUNTER — Telehealth: Payer: Self-pay | Admitting: *Deleted

## 2018-03-28 NOTE — Telephone Encounter (Signed)
-----   Message from Sueanne Margarita, MD sent at 03/25/2018 12:58 PM EDT ----- Good AHI on PAP but needs to improve compliance

## 2018-03-28 NOTE — Telephone Encounter (Signed)
Informed patient of COMPLIANCE results and verbalized understanding was indicated. Patient is aware and agreeable to AHI being within range at 3.2. Patient is aware and agreeable to improving in compliance with machine usage.

## 2018-04-04 DIAGNOSIS — R269 Unspecified abnormalities of gait and mobility: Secondary | ICD-10-CM | POA: Diagnosis not present

## 2018-04-11 DIAGNOSIS — K219 Gastro-esophageal reflux disease without esophagitis: Secondary | ICD-10-CM | POA: Diagnosis not present

## 2018-04-11 DIAGNOSIS — R05 Cough: Secondary | ICD-10-CM | POA: Diagnosis not present

## 2018-04-13 DIAGNOSIS — R059 Cough, unspecified: Secondary | ICD-10-CM | POA: Insufficient documentation

## 2018-04-20 DIAGNOSIS — G4733 Obstructive sleep apnea (adult) (pediatric): Secondary | ICD-10-CM | POA: Diagnosis not present

## 2018-04-24 ENCOUNTER — Emergency Department (HOSPITAL_COMMUNITY)
Admission: EM | Admit: 2018-04-24 | Discharge: 2018-04-24 | Disposition: A | Discharge status: Home / Self Care | Payer: Medicare Other | Attending: Emergency Medicine | Admitting: Emergency Medicine

## 2018-04-24 ENCOUNTER — Ambulatory Visit (INDEPENDENT_AMBULATORY_CARE_PROVIDER_SITE_OTHER): Payer: Medicare Other | Admitting: Family Medicine

## 2018-04-24 ENCOUNTER — Emergency Department (HOSPITAL_BASED_OUTPATIENT_CLINIC_OR_DEPARTMENT_OTHER): Payer: Medicare Other

## 2018-04-24 ENCOUNTER — Encounter (HOSPITAL_COMMUNITY): Payer: Self-pay | Admitting: Emergency Medicine

## 2018-04-24 ENCOUNTER — Other Ambulatory Visit: Payer: Self-pay

## 2018-04-24 ENCOUNTER — Emergency Department (HOSPITAL_COMMUNITY): Payer: Medicare Other

## 2018-04-24 VITALS — BP 130/70 | HR 75 | Temp 98.1°F | Ht 63.0 in | Wt 294.0 lb

## 2018-04-24 DIAGNOSIS — M25571 Pain in right ankle and joints of right foot: Secondary | ICD-10-CM

## 2018-04-24 DIAGNOSIS — I10 Essential (primary) hypertension: Secondary | ICD-10-CM | POA: Diagnosis not present

## 2018-04-24 DIAGNOSIS — E039 Hypothyroidism, unspecified: Secondary | ICD-10-CM | POA: Diagnosis not present

## 2018-04-24 DIAGNOSIS — R2241 Localized swelling, mass and lump, right lower limb: Secondary | ICD-10-CM | POA: Diagnosis present

## 2018-04-24 DIAGNOSIS — Z79899 Other long term (current) drug therapy: Secondary | ICD-10-CM | POA: Insufficient documentation

## 2018-04-24 DIAGNOSIS — M7989 Other specified soft tissue disorders: Secondary | ICD-10-CM

## 2018-04-24 DIAGNOSIS — M79609 Pain in unspecified limb: Secondary | ICD-10-CM | POA: Diagnosis not present

## 2018-04-24 DIAGNOSIS — Z87891 Personal history of nicotine dependence: Secondary | ICD-10-CM | POA: Insufficient documentation

## 2018-04-24 DIAGNOSIS — M25471 Effusion, right ankle: Secondary | ICD-10-CM | POA: Diagnosis not present

## 2018-04-24 DIAGNOSIS — Z7982 Long term (current) use of aspirin: Secondary | ICD-10-CM | POA: Insufficient documentation

## 2018-04-24 IMAGING — CR DG ANKLE COMPLETE 3+V*R*
3 series · 3 of 3 positions shown · non-contrast
Comparison: None.

CLINICAL DATA: 67 y/o  F; right ankle swelling yesterday.

EXAM:
RIGHT ANKLE - COMPLETE 3+ VIEW

[ankle ap]
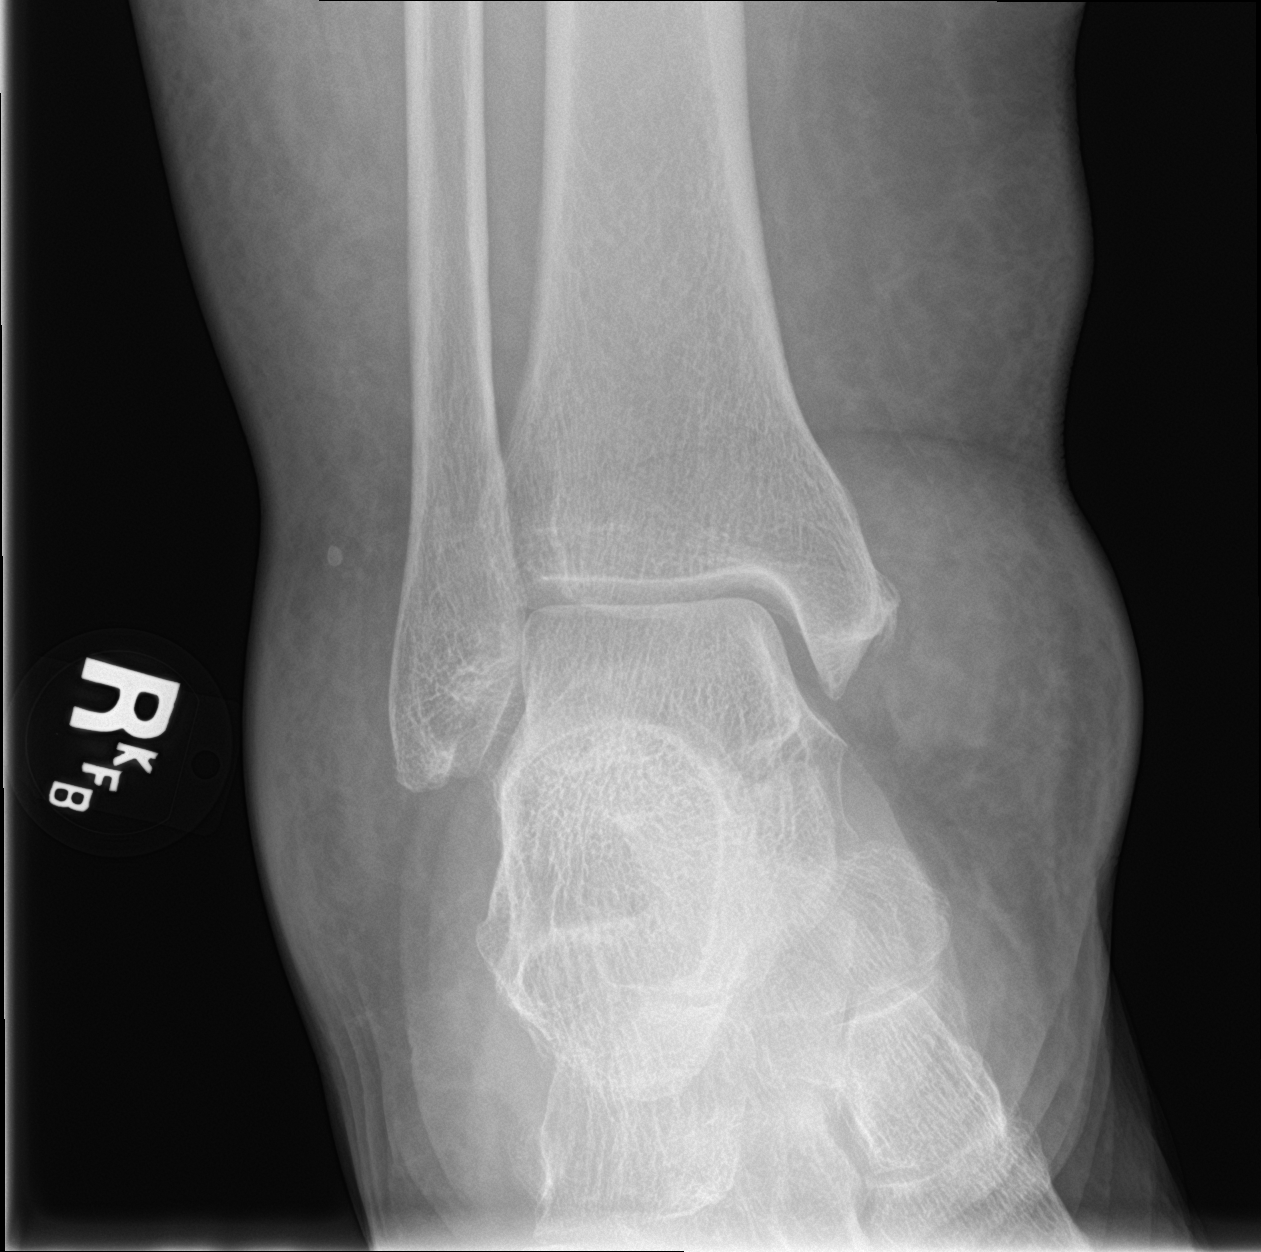

[ankle obl]
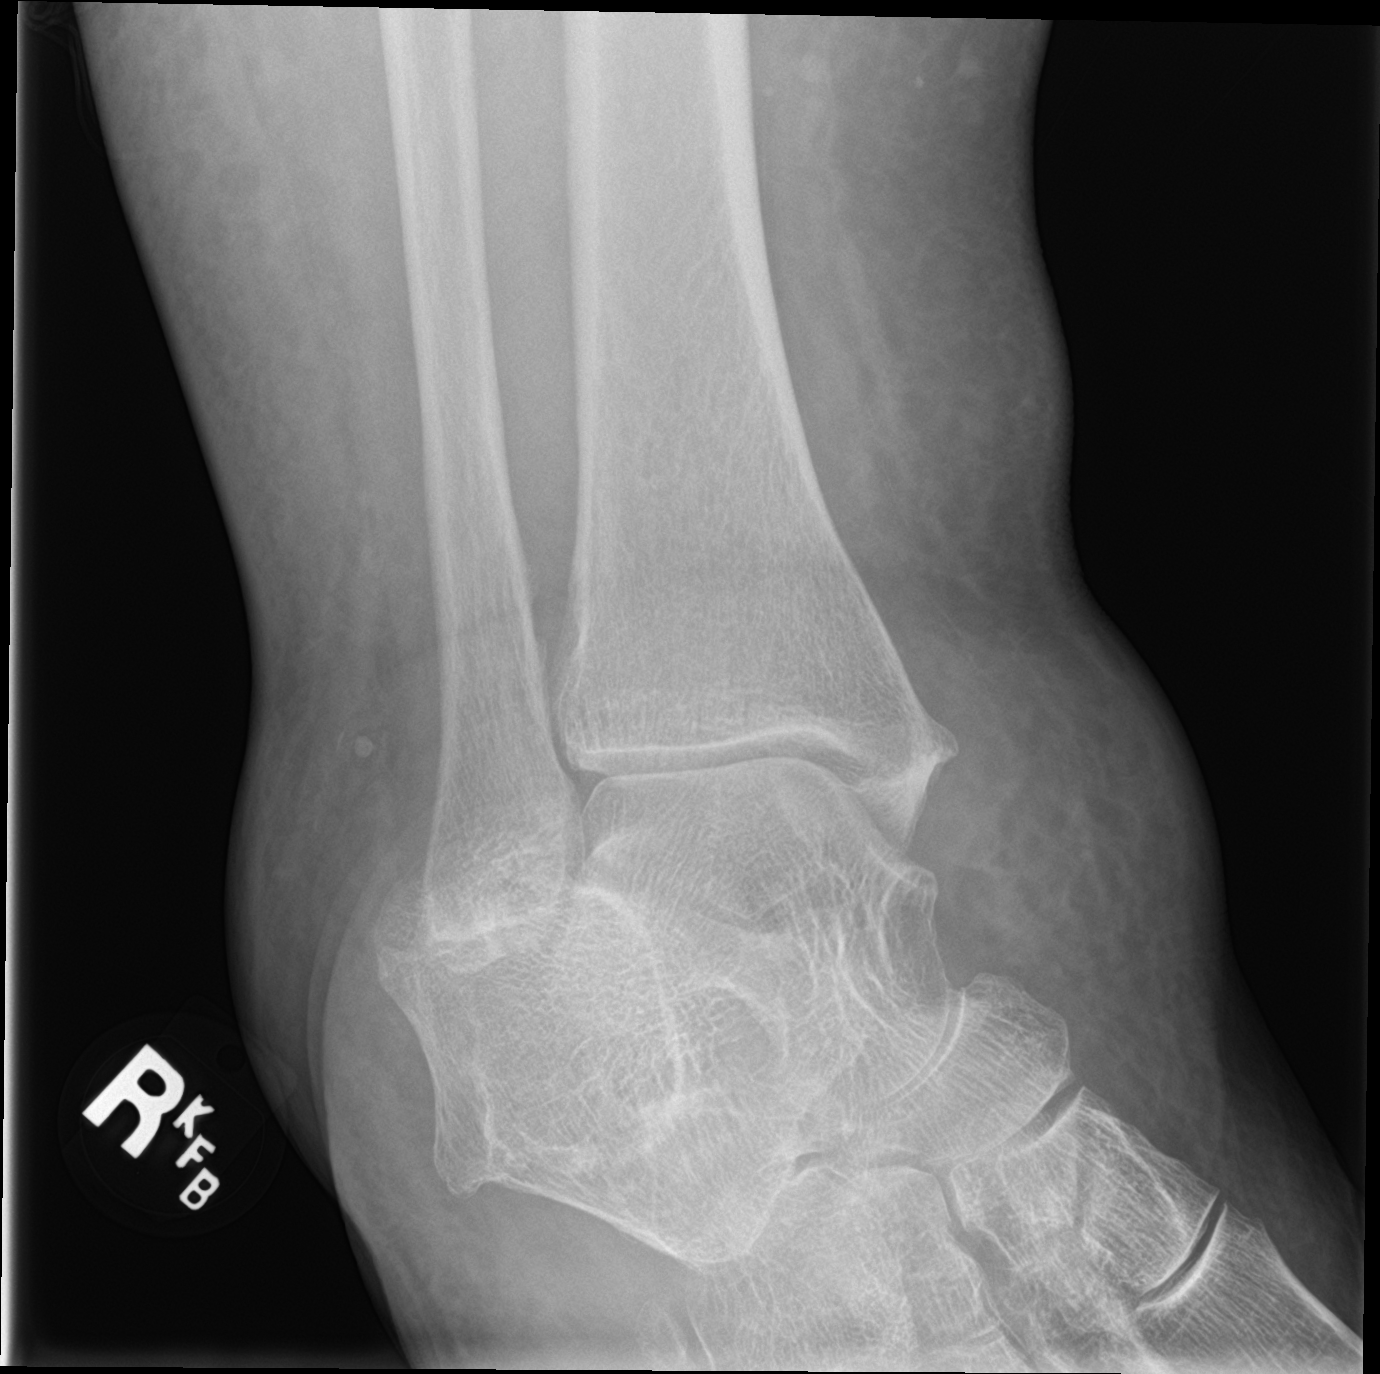

[ankle lat]
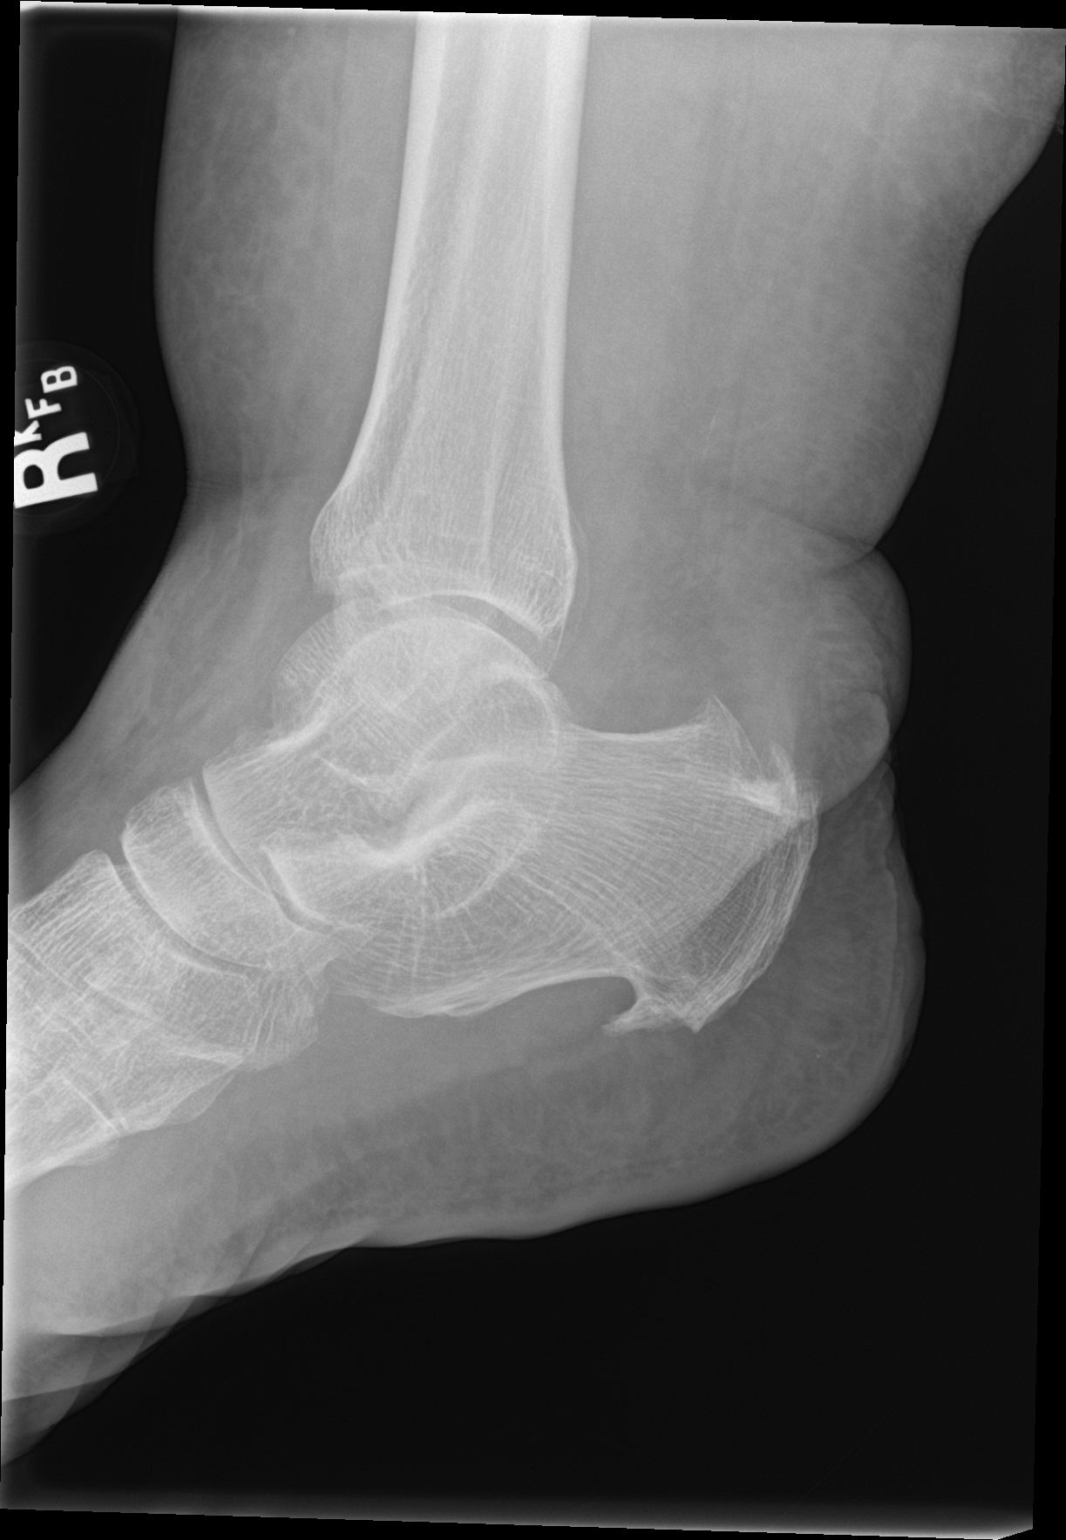

[3 of 3 positions shown; findings below may reference images not displayed]

FINDINGS: There is no evidence of fracture, dislocation, or joint effusion.
There is no evidence of arthropathy or other focal bone abnormality.
Pes planus. Medium dorsal and plantar calcaneal enthesophytes.
IMPRESSION: No acute fracture or dislocation identified.

By: PIMIENTA M.D.

## 2018-04-24 MED ORDER — HYDROCODONE-ACETAMINOPHEN 5-325 MG PO TABS: 1.0000 | ORAL_TABLET | ORAL | 0 refills | Status: DC | PRN

## 2018-04-24 NOTE — ED Notes (Signed)
ED Provider at bedside. 

## 2018-04-24 NOTE — Progress Notes (Signed)
Subjective:    Patient ID: Sheila Jacobs, female    DOB: 03-Apr-1951, 67 y.o.   MRN: [id number]   CC: "knot" in ankle   HPI: Sheila Jacobs is a 67 year old female with a past history significiant for saddle PE in 12/2017 now on Xarelto therapy that presented with increased ankle swelling and soreness. Pt notes over the past week, bilateral ankles have become more swollen, but with the right more prominent. Yesterday, she noticed the medial aspect of her right ankle became sore, with associated calf tenderness. She has tried tylenol, ibuprofen, and leg elevation with little relief. She is able to walk on her foot as normal, but notes it is tender. She denies any precipitating trauma, difficulty breathing, orthopnea, PND, erythema in the region, or fever.   She also endorses episodes of "pins sticking into her chest," lasting about a minute and occurring 1-2 times a week. States this has been going on for years and has not increased in severity or frequency since onset. No specific correlation to time, eating, particular movements, or exercise that she can identify.    Smoking status reviewed  Review of Systems Per HPI, also denies recent illness, fever, headache, changes in vision, shortness of breath, abdominal pain, N/V/D, weakness   Patient Active Problem List   Diagnosis Date Noted  . Pain and swelling of ankle, right 04/24/2018  . Encounter to establish care 01/26/2018  . OSA (obstructive sleep apnea) 01/23/2018  . Hypothyroidism   . Bradycardia   . Hypoxia   . Acute saddle pulmonary embolism without acute cor pulmonale ([place]) 12/29/2017  . History of breast cancer 10/23/2017  . Post-surgical hypothyroidism 06/27/2017  . Thyroid goiter 02/26/2017  . Palpitations 10/16/2016  . Personal history of noncompliance with medical treatment, presenting hazards to health 08/27/2016  . Generalized anxiety disorder 08/09/2016  . Abnormal TSH 05/09/2016  . Paresthesia 01/31/2016  .  Abnormality of gait 01/31/2016  . Low back pain 01/31/2016  . Breast cancer of upper-outer quadrant of left female breast ([place]) 01/31/2016  . Former heavy cigarette smoker (20-39 per day) 11/08/2015  . Goiter 11/08/2015  . Esophageal reflux   . Special screening for malignant neoplasms, colon   . Hearing loss 10/07/2015  . GERD (gastroesophageal reflux disease) 05/03/2015  . Dependent edema 05/03/2015  . Morbid obesity ([place]) 04/29/2015  . Right carotid bruit 10/06/2014  . Hypokalemia   . Hypomagnesemia   . Atypical chest pain 09/18/2014  . Hypertension 09/18/2014  . Hyperlipidemia 09/18/2014  . Enlarged LA (left atrium)   . Chest pain, precordial 09/02/2014     Objective:  BP 130/70   Pulse 75   Temp 98.1 F (36.7 C) (Oral)   Ht 5\' 3"  (1.6 m)   Wt 294 lb (133.4 kg)   SpO2 98%   BMI 52.08 kg/m  Vitals and nursing note reviewed  General: NAD, pleasant Cardiac: RRR, normal heart sounds, no murmurs, no JVD Respiratory: CTAB, normal effort, no crackles noted  Abdomen: soft large abdomen, nontender, nondistended Neuro: alert and oriented, no focal deficits Psych: normal affect MSK/Skin: Ankle ROM decreased, but bilaterally symmetrical, tender with ROM on right ankle. Bilateral 5/5 strength with knee F/E and ankle F/E/inversion/eversion. 1-2 + edema noted to bilateral LE, but increased swelling around ankle on R > L.        Right LE: Minimal ecchymosis-like skin changes noted to medial aspect of ankle overlying tense wrinkle-less skin. Increased swelling under this region, not a discrete fluid  collection. Tenderness with palpation of right calf and medial aspect of right ankle. Increased warmth of area compared to surrounding. Able to bear weight and walk. See picture below        Assessment & Plan:    Pain and swelling of ankle, right Pt with increased swelling and soreness of R ankle with associated calf tenderness. Appears to be an acute worsening of dependent edema  that pt chronically has. Pt denies any trauma precipitating event. Concern for DVT, especially given recent h/o of saddle PE, however understand pt is currently on anti-coagulation therapy with Xarelto, making this less likely, but regardless want to rule this out. Low concern for celluitis as patient has been afebrile and without erythema or drainage. Unlikely that this is sequela of heart failure (G1DD on Echo 03/2018), as patient not endorsing any additional symptoms and no other associated signs of fluid overload on exam. Thyroid and kidney function wnl in 02/2018.   Recommend she be evaluated in the ED immediately after this appointment with RLE doppler U/S. I have discussed this with the patient, and she is amenable to this plan. I have alerted the ED she will be coming over for further evaluation. She is to also to start wearing her compression stockings, reduce her salt and fluid intake, and elevate her legs when able.    F/u in 1 week with PCP. Will follow her ED course.     Darrelyn Hillock, DO Family Medicine Resident PGY-1

## 2018-04-24 NOTE — Progress Notes (Signed)
RLE venous duplex prelim: technically limited due to body habitus. No obvious DVT noted. Previous popliteal DVT appears to have resolved. Landry Mellow, RDMS, RVT

## 2018-04-24 NOTE — Patient Instructions (Addendum)
You were seen today for increased lower extremity swelling, especially in your right ankle, and right calf tenderness.  We want to make sure it is not a clot in your deep veins on the right (DVT).  We have already discussed this and you have agreed, however please go to the emergency room as soon as you leave here and have an ultrasound done of your right calf.  Please also make sure you are wearing your compression stockings, reducing your salt intake, reducing fluid intake, and elevating your legs when you can.  I would like to see you back in 1 week, depending on emergency room evaluation.

## 2018-04-24 NOTE — ED Provider Notes (Signed)
[facility] EMERGENCY DEPARTMENT Provider Note   CSN: [id number] Arrival date & time: 04/24/18  1624     History   Chief Complaint Chief Complaint  Patient presents with  . Ankle Pain  . Leg Swelling    HPI Sheila Jacobs is a 67 y.o. female.  67 year old female presents with right ankle swelling since yesterday.  Patient does use a cane which is in her right hand and she does tend to favor her right foot and ankle.  Patient is on blood thinners and was seen by her doctor was concerned about possible DVT and patient was sent here.  She denies any new chest pain or shortness of breath.  No fever or chills.     Past Medical History:  Diagnosis Date  . Acute saddle pulmonary embolism without acute cor pulmonale ([place]) 12/29/2017  . Anginal pain ([place])   . Anxiety   . Arthritis   . Bradycardia   . Breast cancer ([place])   . Breast cancer of upper-outer quadrant of left female breast ([place]) 01/31/2016  . Colon polyps   . Encounter to establish care 01/26/2018  . Enlarged LA (left atrium)   . GERD (gastroesophageal reflux disease)   . Hard of hearing    Wears hearing aids.  . Headache   . Heavy cigarette smoker    Quit 2015  . History of breast cancer 10/23/2017  . History of radiation therapy 06/18/16- 07/30/16   Left Breast 45 Gy in 25 fractions, Left Breast Boost 6 Gy in 3 fractions.   . Hyperlipidemia   . Hypertension   . Hypoxia   . Morbid obesity ([place])   . Numbness and tingling   . Obesity   . OSA (obstructive sleep apnea) 01/23/2018  . Palpitations 10/16/2016  . Personal history of noncompliance with medical treatment, presenting hazards to health 08/27/2016  . Pulmonary emboli ([place])   . Thyroid goiter   . Vertigo    on occasion    Patient Active Problem List   Diagnosis Date Noted  . Encounter to establish care 01/26/2018  . OSA (obstructive sleep apnea) 01/23/2018  . Hypothyroidism   . Bradycardia   . Hypoxia   . Acute saddle pulmonary  embolism without acute cor pulmonale ([place]) 12/29/2017  . History of breast cancer 10/23/2017  . Post-surgical hypothyroidism 06/27/2017  . Thyroid goiter 02/26/2017  . Palpitations 10/16/2016  . Personal history of noncompliance with medical treatment, presenting hazards to health 08/27/2016  . Generalized anxiety disorder 08/09/2016  . Abnormal TSH 05/09/2016  . Paresthesia 01/31/2016  . Abnormality of gait 01/31/2016  . Low back pain 01/31/2016  . Breast cancer of upper-outer quadrant of left female breast ([place]) 01/31/2016  . Former heavy cigarette smoker (20-39 per day) 11/08/2015  . Goiter 11/08/2015  . Esophageal reflux   . Special screening for malignant neoplasms, colon   . Hearing loss 10/07/2015  . GERD (gastroesophageal reflux disease) 05/03/2015  . Dependent edema 05/03/2015  . Morbid obesity ([place]) 04/29/2015  . Right carotid bruit 10/06/2014  . Hypokalemia   . Hypomagnesemia   . Atypical chest pain 09/18/2014  . Hypertension 09/18/2014  . Hyperlipidemia 09/18/2014  . Enlarged LA (left atrium)   . Chest pain, precordial 09/02/2014    Past Surgical History:  Procedure Laterality Date  . BREAST LUMPECTOMY Left 2017   radiation  . BREAST LUMPECTOMY WITH RADIOACTIVE SEED AND SENTINEL LYMPH NODE BIOPSY Left 04/11/2016   Procedure: BREAST LUMPECTOMY WITH RADIOACTIVE SEED AND  SENTINEL LYMPH NODE BIOPSY;  Surgeon: Excell Seltzer, MD;  Location: [facility];  Service: General;  Laterality: Left;  . COLONOSCOPY WITH PROPOFOL N/A 10/20/2015   Procedure: COLONOSCOPY WITH PROPOFOL;  Surgeon: Mauri Pole, MD;  Location: [facility];  Service: Endoscopy;  Laterality: N/A;  . ESOPHAGOGASTRODUODENOSCOPY (EGD) WITH PROPOFOL N/A 10/20/2015   Procedure: ESOPHAGOGASTRODUODENOSCOPY (EGD) WITH PROPOFOL;  Surgeon: Mauri Pole, MD;  Location: [facility];  Service: Endoscopy;  Laterality: N/A;  . IR ANGIOGRAM PULMONARY BILATERAL SELECTIVE  12/29/2017  . IR ANGIOGRAM SELECTIVE EACH  ADDITIONAL VESSEL  12/29/2017  . IR ANGIOGRAM SELECTIVE EACH ADDITIONAL VESSEL  12/29/2017  . IR INFUSION THROMBOL ARTERIAL INITIAL (MS)  12/29/2017  . IR INFUSION THROMBOL ARTERIAL INITIAL (MS)  12/29/2017  . IR THROMB F/U EVAL ART/VEN FINAL DAY (MS)  12/30/2017  . IR US GUIDE VASC ACCESS RIGHT  12/29/2017  . LEFT HEART CATHETERIZATION WITH CORONARY ANGIOGRAM N/A 10/21/2014   Procedure: LEFT HEART CATHETERIZATION WITH CORONARY ANGIOGRAM;  Surgeon: Burnell Blanks, MD;  Location: [facility] CATH LAB;  Service: Cardiovascular;  Laterality: N/A;  . THYROIDECTOMY N/A 02/26/2017   Procedure: TOTAL THYROIDECTOMY;  Surgeon: Excell Seltzer, MD;  Location: [facility];  Service: General;  Laterality: N/A;  . TUBAL LIGATION       OB History   None      Home Medications    Prior to Admission medications   Medication Sig Start Date End Date Taking? Authorizing Provider  ALPRAZolam (XANAX) 0.25 MG tablet TAKE ONE TABLET BY MOUTH TWICE DAILY AS NEEDED FOR ANXIETY 01/07/17   Henson, Vickie L, NP-C  anastrozole (ARIMIDEX) 1 MG tablet Take 1 tablet (1 mg total) by mouth daily. 11/25/17   Nicholas Lose, MD  aspirin EC 81 MG tablet Take 81 mg by mouth daily.    [provider]  hydrochlorothiazide (HYDRODIURIL) 25 MG tablet Take 1 tablet by mouth daily. 01/07/18   [provider]  ketoconazole (NIZORAL) 2 % cream  APPLY TOPICALLY DAILY AS NEEDED FOR IRRITATION. 03/03/18   Kathrene Alu, MD  levothyroxine (SYNTHROID, LEVOTHROID) 150 MCG tablet Take 1 tablet (150 mcg total) by mouth daily. 01/24/18   Kathrene Alu, MD  pantoprazole (PROTONIX) 40 MG tablet TAKE ONE TABLET BY MOUTH ONCE DAILY 10/28/17   Burtis Junes, NP  rivaroxaban (XARELTO) 20 MG TABS tablet Take 1 tablet (20 mg total) by mouth daily with supper. 01/24/18   Kathrene Alu, MD    Family History Family History  Problem Relation Age of Onset  . Hypertension Mother   . Heart disease Mother        Pacemaker  . Kidney  disease Mother   . Cataracts Mother   . Hearing loss Mother   . Diabetes Mother   . Liver disease Mother   . Diabetes Sister   . Cataracts Brother   . Hearing loss Brother   . Colon cancer Neg Hx   . Thyroid disease Neg Hx     Social History Social History   Tobacco Use  . Smoking status: Former Smoker    Packs/day: 1.00    Years: 45.00    Pack years: 45.00    Types: Cigarettes    Last attempt to quit: 07/11/2014    Years since quitting: 3.7  . Smokeless tobacco: Never Used  Substance Use Topics  . Alcohol use: No    Alcohol/week: 0.0 standard drinks  . Drug use: No     Allergies   Cortisone;  Effexor [venlafaxine]; and Lexapro [escitalopram]   Review of Systems Review of Systems  All other systems reviewed and are negative.    Physical Exam Updated Vital Signs BP 137/70 (BP Location: Right Arm)   Pulse 63   Temp 98.6 F (37 C) (Oral)   Resp 18   SpO2 100%   Physical Exam  Constitutional: She is oriented to person, place, and time. She appears well-developed and well-nourished.  Non-toxic appearance. No distress.  HENT:  Head: Normocephalic and atraumatic.  Eyes: Pupils are equal, round, and reactive to light. Conjunctivae, EOM and lids are normal.  Neck: Normal range of motion. Neck supple. No tracheal deviation present. No thyroid mass present.  Cardiovascular: Normal rate, regular rhythm and normal heart sounds. Exam reveals no gallop.  No murmur heard. Pulmonary/Chest: Effort normal and breath sounds normal. No stridor. No respiratory distress. She has no decreased breath sounds. She has no wheezes. She has no rhonchi. She has no rales.  Abdominal: Soft. Normal appearance and bowel sounds are normal. She exhibits no distension. There is no tenderness. There is no rebound and no CVA tenderness.  Musculoskeletal: Normal range of motion. She exhibits no edema or tenderness.       Feet:  Neurological: She is alert and oriented to person, place, and time.  She has normal strength. No cranial nerve deficit or sensory deficit. GCS eye subscore is 4. GCS verbal subscore is 5. GCS motor subscore is 6.  Skin: Skin is warm and dry. No abrasion and no rash noted.  Psychiatric: She has a normal mood and affect. Her speech is normal and behavior is normal.  Nursing note and vitals reviewed.    ED Treatments / Results  Labs (all labs ordered are listed, but only abnormal results are displayed) Labs Reviewed - No data to display  EKG None  Radiology No results found.  Procedures Procedures (including critical care time)  Medications Ordered in ED Medications - No data to display   Initial Impression / Assessment and Plan / ED Course  I have reviewed the triage vital signs and the nursing notes.  Pertinent labs & imaging results that were available during my care of the patient were reviewed by me and considered in my medical decision making (see chart for details).     Tray negative.  Lower extremity DVT ordered prior to my evaluation also negative.  Patient is on Plavix and does have some swelling and slight ecchymosis.  Suspect that she has some subcu bleeding from being on Plavix as well as using her cane and she favors that foot.  Return precautions given  Final Clinical Impressions(s) / ED Diagnoses   Final diagnoses:  None    ED Discharge Orders    None       Lacretia Leigh, MD 04/24/18 2307

## 2018-04-24 NOTE — ED Triage Notes (Signed)
Pt started having right ankle swelling yesterday and on blood thinners. Pt concerned for blood clot and saw PCP today. PCP sent her here for rule out DVT.

## 2018-04-24 NOTE — ED Provider Notes (Signed)
Patient placed in Quick Look pathway, seen and evaluated   Chief Complaint: right leg pain  HPI:   Sheila Jacobs is a 67 y.o. female Pt started having right ankle swelling yesterday and on blood thinners. Pt concerned for blood clot and saw PCP today. PCP sent her here for rule out DVT  ROS: M/S right leg pain and swelling  Physical Exam:  BP 136/77   Pulse 78   Temp 98.6 F (37 C) (Oral)   Resp 20   SpO2 100%    Gen: No distress  Neuro: Awake and Alert  Skin: right lower leg with swelling, ankle with increased warmth, calf pain on exam.    Initiation of care has begun. The patient has been counseled on the process, plan, and necessity for staying for the completion/evaluation, and the remainder of the medical screening examination    Ashley Murrain, NP 04/24/18 1646    Lacretia Leigh, MD 04/24/18 2228

## 2018-04-24 NOTE — Assessment & Plan Note (Signed)
Pt with increased swelling and soreness of R ankle with associated calf tenderness. Appears to be an acute worsening of dependent edema that pt chronically has. Pt denies any trauma precipitating event. Concern for DVT, especially given recent h/o of saddle PE, however understand pt is currently on anti-coagulation therapy with Xarelto, making this less likely, but regardless want to rule this out. Low concern for celluitis as patient has been afebrile and without erythema or drainage. Unlikely that this is sequela of heart failure (G1DD on Echo 03/2018), as patient not endorsing any additional symptoms and no other associated signs of fluid overload on exam. Thyroid and kidney function wnl in 02/2018.   Recommend she be evaluated in the ED immediately after this appointment with RLE doppler U/S. I have discussed this with the patient, and she is amenable to this plan. I have alerted the ED she will be coming over for further evaluation. She is to also to start wearing her compression stockings, reduce her salt and fluid intake, and elevate her legs when able.    F/u in 1 week with PCP. Will follow her ED course.

## 2018-04-25 ENCOUNTER — Encounter (HOSPITAL_COMMUNITY): Payer: Medicare Other

## 2018-04-28 ENCOUNTER — Telehealth: Payer: Self-pay | Admitting: Emergency Medicine

## 2018-04-28 NOTE — Telephone Encounter (Addendum)
CM contacted by Rachel Bo the pharmacist at [facility] regarding prescription from 8/15 ED encounter.  He reports pt has never had an rx for Norco and the MME is over 81.  He recommends changing rx instructions to 1-2 tab q 8 hours instead of q 4 hours or 1 tab q 4 hours.  CM confirmed 1-2 tab q 8 hours would be appropriate.  No further CM needs noted at this time.

## 2018-04-29 ENCOUNTER — Encounter: Payer: Self-pay | Admitting: Cardiology

## 2018-04-29 NOTE — Progress Notes (Signed)
Cardiology Office Note   Date:  04/30/2018   ID:  Sheila Jacobs, DOB 11-23-1950, MRN [id number]  PCP:  Kathrene Alu, MD  Cardiologist:   No primary care provider on file.   Chief Complaint  Patient presents with  . Shortness of Breath      History of Present Illness: Sheila Jacobs is a 67 y.o. female who presents for follow up of pulmonary embolism.  She was admitted in April with this.  She has also been diagnosed with sleep apnea this year.  Since I last saw her she is had no new cardiovascular complaints.  She bruised her ankle and was in the emergency room for this.  She continues to get some fleeting chest discomfort but this seems to be unchanged from 2016 when she had a high risk perfusion study but normal coronary arteries on cath.  She is had no new cardiovascular complaints.  I think she is very sedentary.  She has morbid obesity and it does not sound like she does a lot of walking.  She says she does not exercise as when she is watching.  She denies any new palpitations, presyncope or syncope.  She had none of the shortness of breath that she had prior to or at the time of her pulmonary embolism.   Past Medical History:  Diagnosis Date  . Acute saddle pulmonary embolism without acute cor pulmonale ([place]) 12/29/2017  . Anginal pain ([place])   . Anxiety   . Arthritis   . Bradycardia   . Breast cancer of upper-outer quadrant of left female breast ([place]) 01/31/2016  . Colon polyps   . GERD (gastroesophageal reflux disease)   . Heavy cigarette smoker    Quit 2015  . History of radiation therapy 06/18/16- 07/30/16   Left Breast 45 Gy in 25 fractions, Left Breast Boost 6 Gy in 3 fractions.   . Hyperlipidemia   . Hypertension   . Morbid obesity ([place])   . OSA (obstructive sleep apnea) 01/23/2018  . Palpitations 10/16/2016  . Personal history of noncompliance with medical treatment, presenting hazards to health 08/27/2016  . Thyroid goiter   . Vertigo    on occasion     Past Surgical History:  Procedure Laterality Date  . BREAST LUMPECTOMY Left 2017   radiation  . BREAST LUMPECTOMY WITH RADIOACTIVE SEED AND SENTINEL LYMPH NODE BIOPSY Left 04/11/2016   Procedure: BREAST LUMPECTOMY WITH RADIOACTIVE SEED AND SENTINEL LYMPH NODE BIOPSY;  Surgeon: Excell Seltzer, MD;  Location: [facility];  Service: General;  Laterality: Left;  . COLONOSCOPY WITH PROPOFOL N/A 10/20/2015   Procedure: COLONOSCOPY WITH PROPOFOL;  Surgeon: Mauri Pole, MD;  Location: [facility];  Service: Endoscopy;  Laterality: N/A;  . ESOPHAGOGASTRODUODENOSCOPY (EGD) WITH PROPOFOL N/A 10/20/2015   Procedure: ESOPHAGOGASTRODUODENOSCOPY (EGD) WITH PROPOFOL;  Surgeon: Mauri Pole, MD;  Location: [facility];  Service: Endoscopy;  Laterality: N/A;  . IR ANGIOGRAM PULMONARY BILATERAL SELECTIVE  12/29/2017  . IR ANGIOGRAM SELECTIVE EACH ADDITIONAL VESSEL  12/29/2017  . IR ANGIOGRAM SELECTIVE EACH ADDITIONAL VESSEL  12/29/2017  . IR INFUSION THROMBOL ARTERIAL INITIAL (MS)  12/29/2017  . IR INFUSION THROMBOL ARTERIAL INITIAL (MS)  12/29/2017  . IR THROMB F/U EVAL ART/VEN FINAL DAY (MS)  12/30/2017  . IR US GUIDE VASC ACCESS RIGHT  12/29/2017  . LEFT HEART CATHETERIZATION WITH CORONARY ANGIOGRAM N/A 10/21/2014   Procedure: LEFT HEART CATHETERIZATION WITH CORONARY ANGIOGRAM;  Surgeon: Burnell Blanks, MD;  Location: [facility] CATH  LAB;  Service: Cardiovascular;  Laterality: N/A;  . THYROIDECTOMY N/A 02/26/2017   Procedure: TOTAL THYROIDECTOMY;  Surgeon: Excell Seltzer, MD;  Location: [facility];  Service: General;  Laterality: N/A;  . TUBAL LIGATION       Current Outpatient Medications  Medication Sig Dispense Refill  . ALPRAZolam (XANAX) 0.25 MG tablet TAKE ONE TABLET BY MOUTH TWICE DAILY AS NEEDED FOR ANXIETY 30 tablet 1  . anastrozole (ARIMIDEX) 1 MG tablet Take 1 tablet (1 mg total) by mouth daily. 90 tablet 3  . hydrochlorothiazide (HYDRODIURIL) 25 MG tablet Take 1 tablet by mouth daily.     Marland Kitchen HYDROcodone-acetaminophen (NORCO/VICODIN) 5-325 MG tablet Take 1-2 tablets by mouth every 4 (four) hours as needed. 10 tablet 0  . ketoconazole (NIZORAL) 2 % cream  APPLY TOPICALLY DAILY AS NEEDED FOR IRRITATION. 30 g 1  . levothyroxine (SYNTHROID, LEVOTHROID) 150 MCG tablet Take 1 tablet (150 mcg total) by mouth daily. 30 tablet 3  . pantoprazole (PROTONIX) 40 MG tablet TAKE ONE TABLET BY MOUTH ONCE DAILY 30 tablet 11  . rivaroxaban (XARELTO) 20 MG TABS tablet Take 1 tablet (20 mg total) by mouth daily with supper. 30 tablet 4   No current facility-administered medications for this visit.     Allergies:   Cortisone; Effexor [venlafaxine]; and Lexapro [escitalopram]     ROS:  Please see the history of present illness.   Otherwise, review of systems are positive for none.   All other systems are reviewed and negative.    PHYSICAL EXAM: VS:  BP 127/71   Pulse 75   Ht 5' 3.5" (1.613 m)   Wt 293 lb (132.9 kg)   SpO2 95%   BMI 51.09 kg/m  , BMI Body mass index is 51.09 kg/m. GENERAL:  Well appearing NECK:  No jugular venous distention, waveform within normal limits, carotid upstroke brisk and symmetric, no bruits, no thyromegal LUNGS:  Clear to auscultation bilaterally CHEST:  Unremarkable HEART:  PMI not displaced or sustained,S1 and S2 within normal limits, no S3, no S4, no clicks, no rubs, no murmurs ABD:  Flat, positive bowel sounds normal in frequency in pitch, no bruits, no rebound, no guarding, no midline pulsatile mass, no hepatomegaly, no splenomegaly, morbidly obese EXT:  2 plus pulses throughout, no edema, no cyanosis no clubbing    EKG:  EKG is ordered today. The ekg ordered today demonstrates sinus rhythm, rate 66, axis within normal limits, intervals within normal limits, resolution of the anterior T wave inversion   Recent Labs: 12/28/2017: ALT 11; B Natriuretic Peptide 592.6 01/02/2018: Hemoglobin 11.0; Magnesium 2.1; Platelets 129 02/11/2018: BUN 10; Creatinine,  Ser 0.77; Potassium 3.6; Sodium 140; TSH 3.14    Lipid Panel    Component Value Date/Time   CHOL 168 08/09/2016 1016   TRIG 39 08/09/2016 1016   HDL 65 08/09/2016 1016   CHOLHDL 2.6 08/09/2016 1016   VLDL 8 08/09/2016 1016   LDLCALC 95 08/09/2016 1016      Wt Readings from Last 3 Encounters:  04/30/18 293 lb (132.9 kg)  04/24/18 294 lb (133.4 kg)  03/17/18 297 lb (134.7 kg)      Other studies Reviewed: Additional studies/ records that were reviewed today include: Hospital records, EKG. Review of the above records demonstrates:  Please see elsewhere in the note.     ASSESSMENT AND PLAN:  BILATERAL PE:    She will need follow up echo.  She had evidence of RV dysfunction with her PE.   She  will continue anticoagulation for now.   See her back in October at the six month visit.  I reviewed the hospital records today and believe this to be unprovoked as I do not see another if it can cause.  She did have a past history of breast cancer.  However, again I would continue anticoagulation indefinitely and what I believe to be unprovoked pulmonary emboli with a severe presentation.  OK to stop ASA.   SLEEP APNEA: Phone notes indicate that she has not had good compliance with CPAP.     MORBID OBESITY:    She needs continued instruction on diet an exercise.   HTN: The blood pressure is at target. No change in medications is indicated. We will continue with therapeutic lifestyle changes (TLC).  TOBACCO ABUSE:  Quit 4 yours ago.    Current medicines are reviewed at length with the patient today.  The patient does not have concerns regarding medicines.  The following changes have been made:  no change  Labs/ tests ordered today include:   Orders Placed This Encounter  Procedures  . EKG 12-Lead  . ECHOCARDIOGRAM COMPLETE     Disposition:   FU with APP in Oct.  O would like her to follow-up with the APP in October as her to see the APP echo because she has   Signed, Minus Breeding, MD  04/30/2018 10:58 AM    [place] history of pulmonary embolism she does not need to be on aspirin

## 2018-04-30 ENCOUNTER — Encounter: Payer: Self-pay | Admitting: Cardiology

## 2018-04-30 ENCOUNTER — Ambulatory Visit (INDEPENDENT_AMBULATORY_CARE_PROVIDER_SITE_OTHER): Payer: Medicare Other | Admitting: Cardiology

## 2018-04-30 VITALS — BP 127/71 | HR 75 | Ht 63.5 in | Wt 293.0 lb

## 2018-04-30 DIAGNOSIS — I10 Essential (primary) hypertension: Secondary | ICD-10-CM | POA: Diagnosis not present

## 2018-04-30 DIAGNOSIS — I2692 Saddle embolus of pulmonary artery without acute cor pulmonale: Secondary | ICD-10-CM | POA: Diagnosis not present

## 2018-04-30 DIAGNOSIS — G4730 Sleep apnea, unspecified: Secondary | ICD-10-CM | POA: Diagnosis not present

## 2018-04-30 NOTE — Patient Instructions (Signed)
Medication Instructions:   STOP- Aspirin  If you need a refill on your cardiac medications before your next appointment, please call your pharmacy.  Labwork: None Ordered    Testing/Procedures: Your physician has requested that you have an echocardiogram. Echocardiography is a painless test that uses sound waves to create images of your heart. It provides your doctor with information about the size and shape of your heart and how well your heart's chambers and valves are working. This procedure takes approximately one hour. There are no restrictions for this procedure.   Follow-Up: Your physician wants you to follow-up in: October with Rosaria Ferries.      Thank you for choosing [facility]!!

## 2018-05-05 DIAGNOSIS — R269 Unspecified abnormalities of gait and mobility: Secondary | ICD-10-CM | POA: Diagnosis not present

## 2018-05-08 ENCOUNTER — Encounter: Payer: Self-pay | Admitting: Cardiology

## 2018-05-08 ENCOUNTER — Ambulatory Visit (INDEPENDENT_AMBULATORY_CARE_PROVIDER_SITE_OTHER): Payer: Medicare Other | Admitting: Cardiology

## 2018-05-08 ENCOUNTER — Ambulatory Visit (HOSPITAL_COMMUNITY): Payer: Medicare Other | Attending: Cardiology

## 2018-05-08 VITALS — BP 140/60 | HR 82 | Ht 63.5 in | Wt 291.0 lb

## 2018-05-08 DIAGNOSIS — I10 Essential (primary) hypertension: Secondary | ICD-10-CM | POA: Diagnosis not present

## 2018-05-08 DIAGNOSIS — G4733 Obstructive sleep apnea (adult) (pediatric): Secondary | ICD-10-CM

## 2018-05-08 DIAGNOSIS — I2692 Saddle embolus of pulmonary artery without acute cor pulmonale: Secondary | ICD-10-CM | POA: Diagnosis not present

## 2018-05-08 LAB — ECHOCARDIOGRAM COMPLETE
Height: 63.5 in
Weight: 4656 oz

## 2018-05-08 NOTE — Patient Instructions (Signed)

## 2018-05-08 NOTE — Progress Notes (Addendum)
Cardiology Office Note:    Date:  05/08/2018   ID:  Sheila Jacobs, DOB 03-17-1951, MRN [id number]  PCP:  Kathrene Alu, MD  Cardiologist:  No primary care provider on file.    Referring MD: Kathrene Alu, MD   Chief Complaint  Patient presents with  . Sleep Apnea  . Hypertension    History of Present Illness:    Sheila Jacobs is a 67 y.o. female with a hx of bradycardia, HTN, obesity who was referred by Dr. [name] for sleep study.  ESS was 2.  She says that she has been told that she snores badly but since going on CPAP this has resolved. She was found to have severe OSA with an AHI of 30/hr and O2 desats as low as 80%.  She underwent CPAP titration to 15cm H2O.  She is doing well with her CPAP device and thinks that she has gotten used to it. She tolerates the mask and feels the pressure is adequate.  Since going on CPAP she feels rested in the am and has no significant daytime sleepiness. She denies any significant mouth or nasal dryness or nasal congestion.  She does not think that he snores.     Past Surgical History:  Procedure Laterality Date  . BREAST LUMPECTOMY Left 2017   radiation  . BREAST LUMPECTOMY WITH RADIOACTIVE SEED AND SENTINEL LYMPH NODE BIOPSY Left 04/11/2016   Procedure: BREAST LUMPECTOMY WITH RADIOACTIVE SEED AND SENTINEL LYMPH NODE BIOPSY;  Surgeon: Excell Seltzer, MD;  Location: [facility];  Service: General;  Laterality: Left;  . COLONOSCOPY WITH PROPOFOL N/A 10/20/2015   Procedure: COLONOSCOPY WITH PROPOFOL;  Surgeon: Mauri Pole, MD;  Location: [facility];  Service: Endoscopy;  Laterality: N/A;  . ESOPHAGOGASTRODUODENOSCOPY (EGD) WITH PROPOFOL N/A 10/20/2015   Procedure: ESOPHAGOGASTRODUODENOSCOPY (EGD) WITH PROPOFOL;  Surgeon: Mauri Pole, MD;  Location: [facility];  Service: Endoscopy;  Laterality: N/A;  . IR ANGIOGRAM PULMONARY BILATERAL SELECTIVE  12/29/2017  . IR ANGIOGRAM SELECTIVE EACH ADDITIONAL VESSEL  12/29/2017  . IR  ANGIOGRAM SELECTIVE EACH ADDITIONAL VESSEL  12/29/2017  . IR INFUSION THROMBOL ARTERIAL INITIAL (MS)  12/29/2017  . IR INFUSION THROMBOL ARTERIAL INITIAL (MS)  12/29/2017  . IR THROMB F/U EVAL ART/VEN FINAL DAY (MS)  12/30/2017  . IR US GUIDE VASC ACCESS RIGHT  12/29/2017  . LEFT HEART CATHETERIZATION WITH CORONARY ANGIOGRAM N/A 10/21/2014   Procedure: LEFT HEART CATHETERIZATION WITH CORONARY ANGIOGRAM;  Surgeon: Burnell Blanks, MD;  Location: [facility] CATH LAB;  Service: Cardiovascular;  Laterality: N/A;  . THYROIDECTOMY N/A 02/26/2017   Procedure: TOTAL THYROIDECTOMY;  Surgeon: Excell Seltzer, MD;  Location: [facility];  Service: General;  Laterality: N/A;  . TUBAL LIGATION      Current Medications: Current Meds  Medication Sig  . ALPRAZolam (XANAX) 0.25 MG tablet TAKE ONE TABLET BY MOUTH TWICE DAILY AS NEEDED FOR ANXIETY  . anastrozole (ARIMIDEX) 1 MG tablet Take 1 tablet (1 mg total) by mouth daily.  . hydrochlorothiazide (HYDRODIURIL) 25 MG tablet Take 1 tablet by mouth daily.  Marland Kitchen HYDROcodone-acetaminophen (NORCO/VICODIN) 5-325 MG tablet Take 1-2 tablets by mouth every 4 (four) hours as needed.  Marland Kitchen ketoconazole (NIZORAL) 2 % cream  APPLY TOPICALLY DAILY AS NEEDED FOR IRRITATION.  Marland Kitchen levothyroxine (SYNTHROID, LEVOTHROID) 150 MCG tablet Take 1 tablet (150 mcg total) by mouth daily.  . pantoprazole (PROTONIX) 40 MG tablet TAKE ONE TABLET BY MOUTH ONCE DAILY  . rivaroxaban (XARELTO) 20 MG TABS tablet Take  1 tablet (20 mg total) by mouth daily with supper.     Allergies:   Cortisone; Effexor [venlafaxine]; and Lexapro [escitalopram]   Social History   Socioeconomic History  . Marital status: Single    Spouse name: Not on file  . Number of children: 1  . Years of education: 26  . Highest education level: Not on file  Occupational History  . Occupation: Retired  Scientific laboratory technician  . Financial resource strain: Not on file  . Food insecurity:    Worry: Not on file    Inability: Not on file    . Transportation needs:    Medical: Not on file    Non-medical: Not on file  Tobacco Use  . Smoking status: Former Smoker    Packs/day: 1.00    Years: 45.00    Pack years: 45.00    Types: Cigarettes    Last attempt to quit: 07/11/2014    Years since quitting: 3.8  . Smokeless tobacco: Never Used  Substance and Sexual Activity  . Alcohol use: No    Alcohol/week: 0.0 standard drinks  . Drug use: No  . Sexual activity: Not on file  Lifestyle  . Physical activity:    Days per week: Not on file    Minutes per session: Not on file  . Stress: Not on file  Relationships  . Social connections:    Talks on phone: Not on file    Gets together: Not on file    Attends religious service: Not on file    Active member of club or organization: Not on file    Attends meetings of clubs or organizations: Not on file    Relationship status: Not on file  Other Topics Concern  . Not on file  Social History Narrative   Lives with daughter.    Right-hand.   Occasional use of caffeine.      Family History: The patient's family history includes Cataracts in her brother and mother; Diabetes in her mother and sister; Hearing loss in her brother and mother; Heart disease in her mother; Hypertension in her mother; Kidney disease in her mother; Liver disease in her mother. There is no history of Colon cancer or Thyroid disease.  ROS:   Please see the history of present illness.    ROS  All other systems reviewed and negative.   EKGs/Labs/Other Studies Reviewed:    The following studies were reviewed today: PAP download  EKG:  EKG is not ordered today.  T  Recent Labs: 12/28/2017: ALT 11; B Natriuretic Peptide 592.6 01/02/2018: Hemoglobin 11.0; Magnesium 2.1; Platelets 129 02/11/2018: BUN 10; Creatinine, Ser 0.77; Potassium 3.6; Sodium 140; TSH 3.14   Recent Lipid Panel    Component Value Date/Time   CHOL 168 08/09/2016 1016   TRIG 39 08/09/2016 1016   HDL 65 08/09/2016 1016   CHOLHDL 2.6  08/09/2016 1016   VLDL 8 08/09/2016 1016   LDLCALC 95 08/09/2016 1016    Physical Exam:    VS:  BP 140/60   Pulse 82   Ht 5' 3.5" (1.613 m)   Wt 291 lb (132 kg)   SpO2 94%   BMI 50.74 kg/m     Wt Readings from Last 3 Encounters:  05/08/18 291 lb (132 kg)  04/30/18 293 lb (132.9 kg)  04/24/18 294 lb (133.4 kg)     GEN:  Well nourished, well developed in no acute distress HEENT: Normal NECK: No JVD; No carotid bruits LYMPHATICS: No lymphadenopathy CARDIAC:  RRR, no murmurs, rubs, gallops RESPIRATORY:  Clear to auscultation without rales, wheezing or rhonchi  ABDOMEN: Soft, non-tender, non-distended MUSCULOSKELETAL:  No edema; No deformity  SKIN: Warm and dry NEUROLOGIC:  Alert and oriented x 3 PSYCHIATRIC:  Normal affect   ASSESSMENT:    1. OSA (obstructive sleep apnea)   2. Essential hypertension   3. Morbid obesity ([place])    PLAN:    In order of problems listed above:  1.  OSA - the patient is tolerating PAP therapy well without any problems. The PAP download was reviewed today and showed an AHI of 1.3/hr on 15 cm H2O with 93% compliance in using more than 4 hours nightly.  The patient has been using and benefiting from PAP use and will continue to benefit from therapy.   2.  HTN - BP is controlled on exam today.  She will continue on HCTZ 25mg  daily.    3.  Morbid obesity - I have encouraged her to get into a routine exercise program and cut back on carbs and portions.    Medication Adjustments/Labs and Tests Ordered: Current medicines are reviewed at length with the patient today.  Concerns regarding medicines are outlined above.  No orders of the defined types were placed in this encounter.  No orders of the defined types were placed in this encounter.   Signed, Fransico Him, MD  05/08/2018 10:41 AM    [place]

## 2018-05-15 ENCOUNTER — Other Ambulatory Visit: Payer: Self-pay

## 2018-05-16 ENCOUNTER — Other Ambulatory Visit: Payer: Self-pay | Admitting: Family Medicine

## 2018-05-16 MED ORDER — ALPRAZOLAM 0.25 MG PO TABS: 0.2500 mg | ORAL_TABLET | Freq: Two times a day (BID) | ORAL | 0 refills | Status: DC | PRN

## 2018-05-16 NOTE — Telephone Encounter (Signed)
I will refill a small quantity now, but Sheila Jacobs will need to be seen to discuss this medication before additional Xanax will be prescribed.

## 2018-05-21 DIAGNOSIS — G4733 Obstructive sleep apnea (adult) (pediatric): Secondary | ICD-10-CM | POA: Diagnosis not present

## 2018-05-27 DIAGNOSIS — G4733 Obstructive sleep apnea (adult) (pediatric): Secondary | ICD-10-CM | POA: Diagnosis not present

## 2018-05-27 DIAGNOSIS — R269 Unspecified abnormalities of gait and mobility: Secondary | ICD-10-CM | POA: Diagnosis not present

## 2018-05-29 ENCOUNTER — Other Ambulatory Visit: Payer: Self-pay | Admitting: Adult Health

## 2018-05-29 DIAGNOSIS — Z853 Personal history of malignant neoplasm of breast: Secondary | ICD-10-CM

## 2018-06-05 DIAGNOSIS — R269 Unspecified abnormalities of gait and mobility: Secondary | ICD-10-CM | POA: Diagnosis not present

## 2018-06-11 ENCOUNTER — Ambulatory Visit
Admission: RE | Admit: 2018-06-11 | Discharge: 2018-06-11 | Disposition: A | Discharge status: Home / Self Care | Payer: Medicare Other | Source: Ambulatory Visit | Attending: Adult Health | Admitting: Adult Health

## 2018-06-11 ENCOUNTER — Other Ambulatory Visit (INDEPENDENT_AMBULATORY_CARE_PROVIDER_SITE_OTHER): Payer: Medicare Other

## 2018-06-11 DIAGNOSIS — E890 Postprocedural hypothyroidism: Secondary | ICD-10-CM

## 2018-06-11 DIAGNOSIS — Z853 Personal history of malignant neoplasm of breast: Secondary | ICD-10-CM | POA: Diagnosis not present

## 2018-06-11 DIAGNOSIS — R928 Other abnormal and inconclusive findings on diagnostic imaging of breast: Secondary | ICD-10-CM | POA: Diagnosis not present

## 2018-06-11 LAB — TSH: TSH: 1.16 u[IU]/mL (ref 0.35–4.50)

## 2018-06-11 LAB — T4, FREE: FREE T4: 1.2 ng/dL (ref 0.60–1.60)

## 2018-06-11 IMAGING — MG DIGITAL DIAGNOSTIC BILATERAL MAMMOGRAM WITH TOMO AND CAD
8 of 17 series · 8 of 40 positions shown · non-contrast
Comparison: Previous exam(s).

CLINICAL DATA: 67-year-old female status post malignant left
lumpectomy in [M6]. No current symptoms.

EXAM:
DIGITAL DIAGNOSTIC BILATERAL MAMMOGRAM WITH CAD AND TOMO

[L CC]
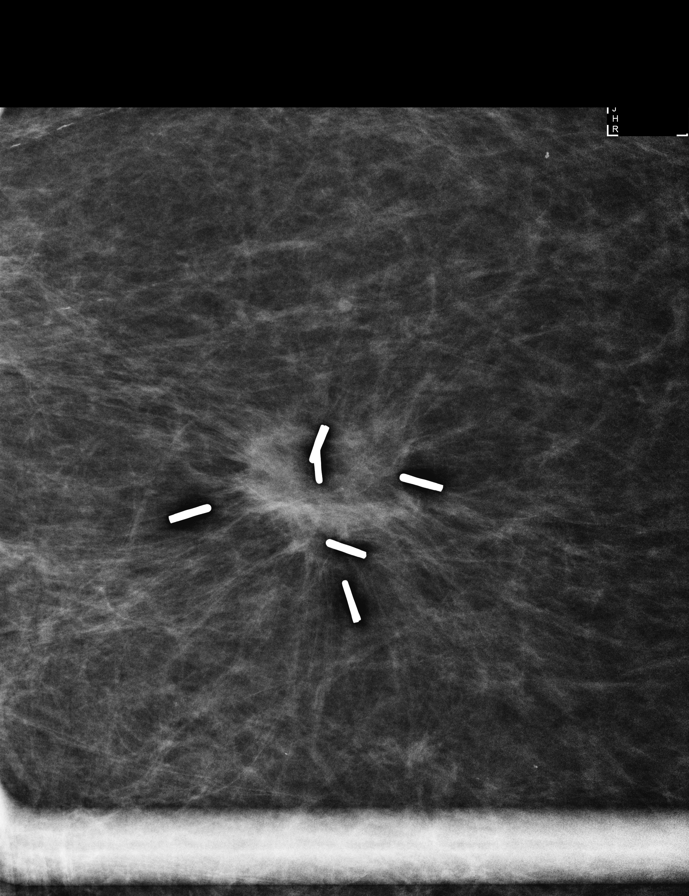

[L MLO synth-2D]
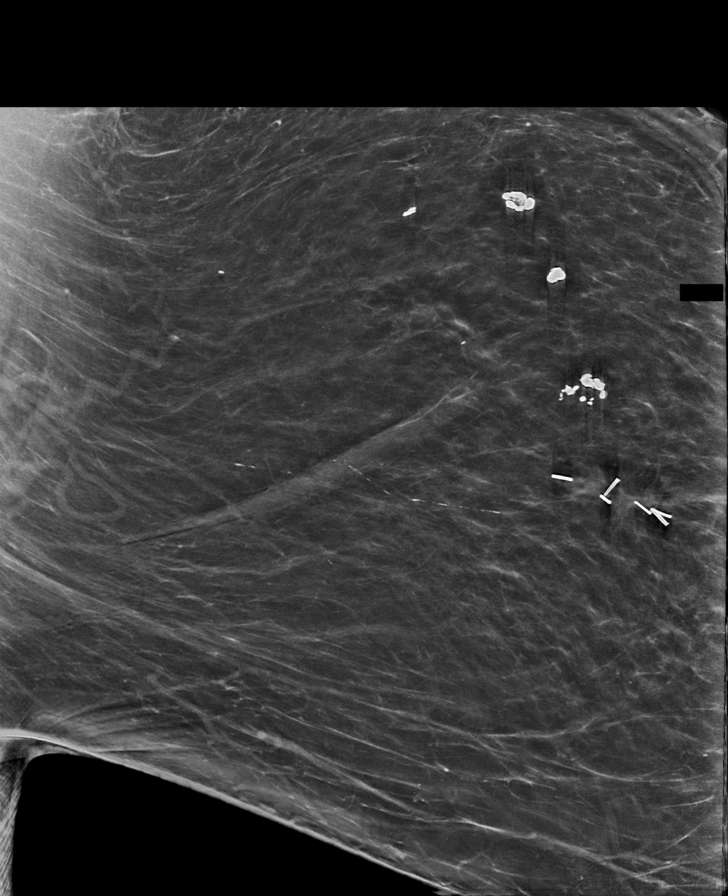

[L CC synth-2D]
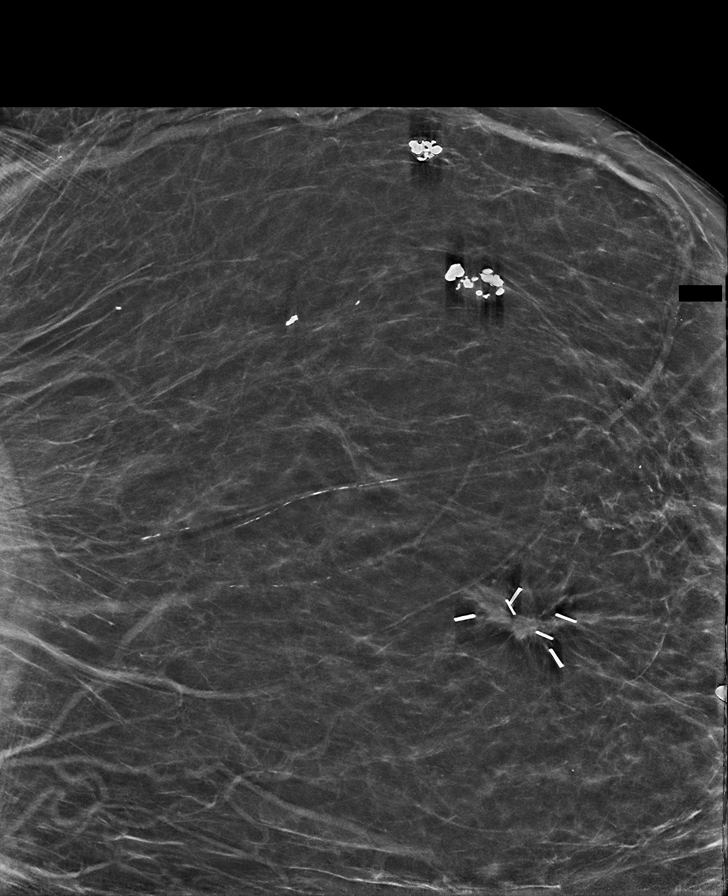

[R CC synth-2D (1 of 2)]
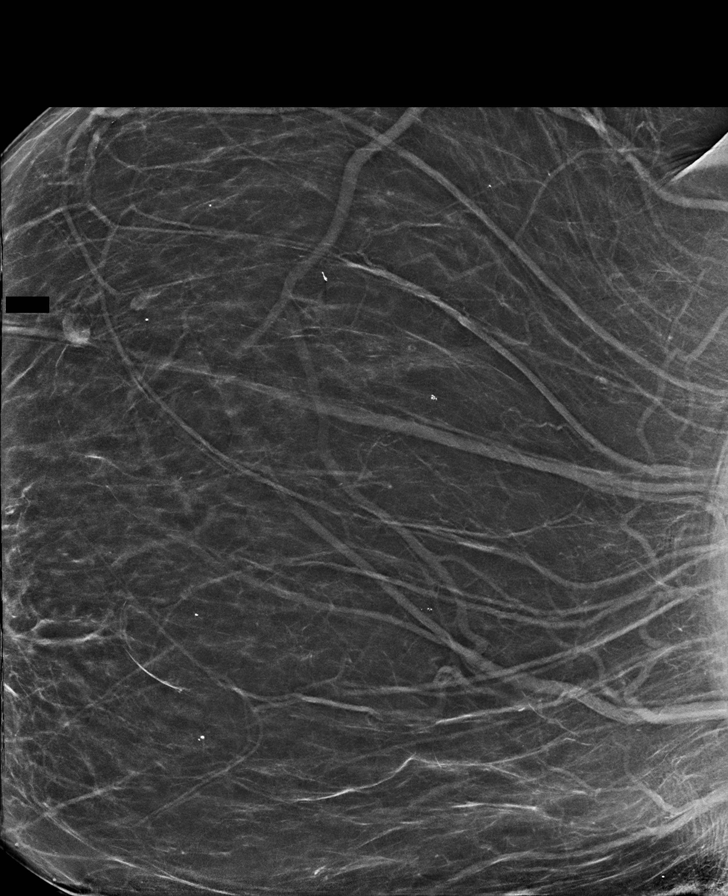

[R MLO synth-2D (1 of 2)]
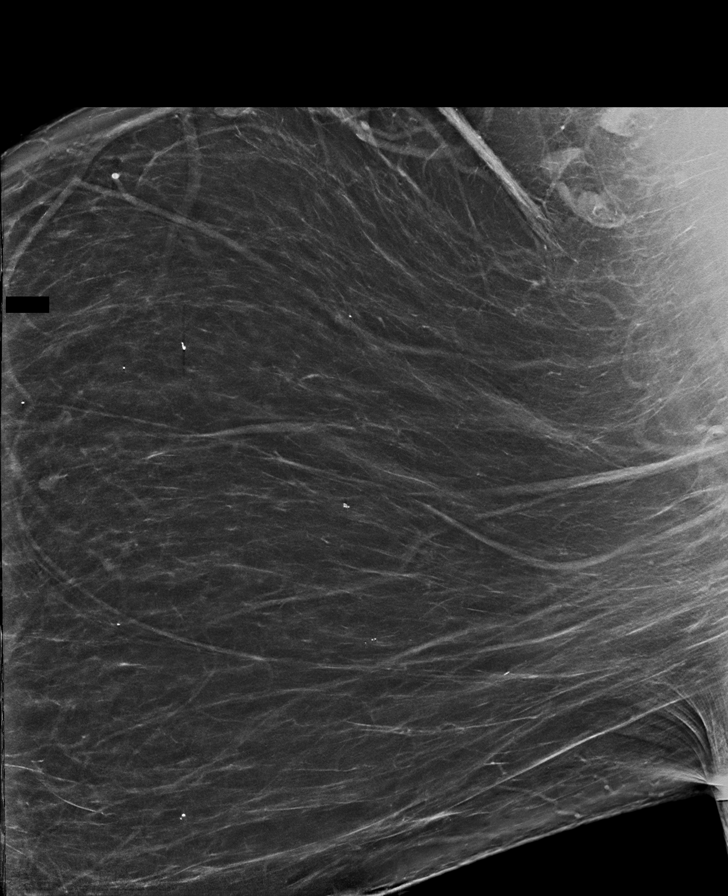

[R MLO synth-2D (2 of 2)]
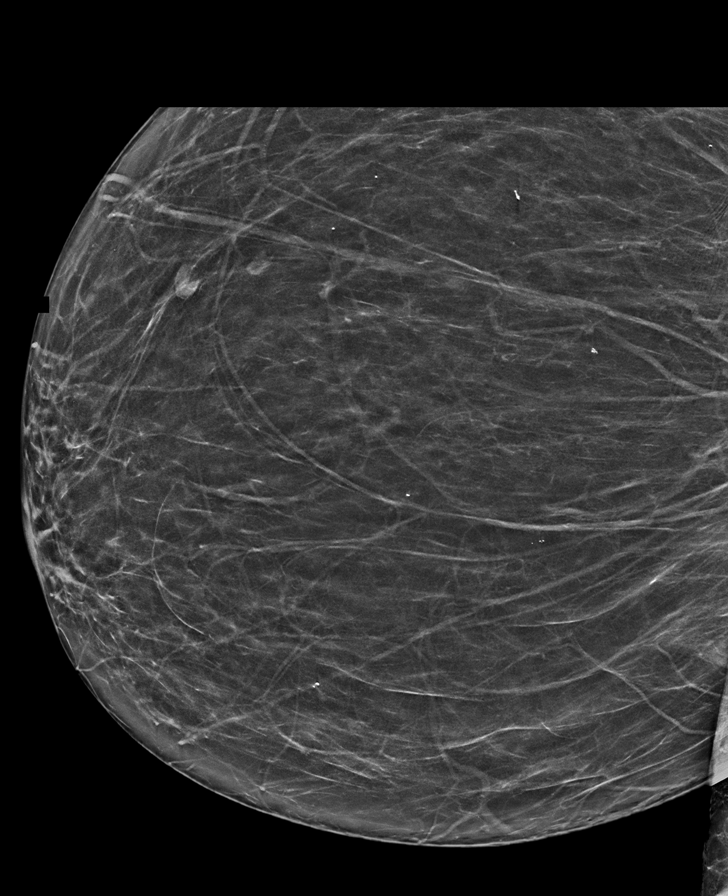

[R CC synth-2D (2 of 2)]
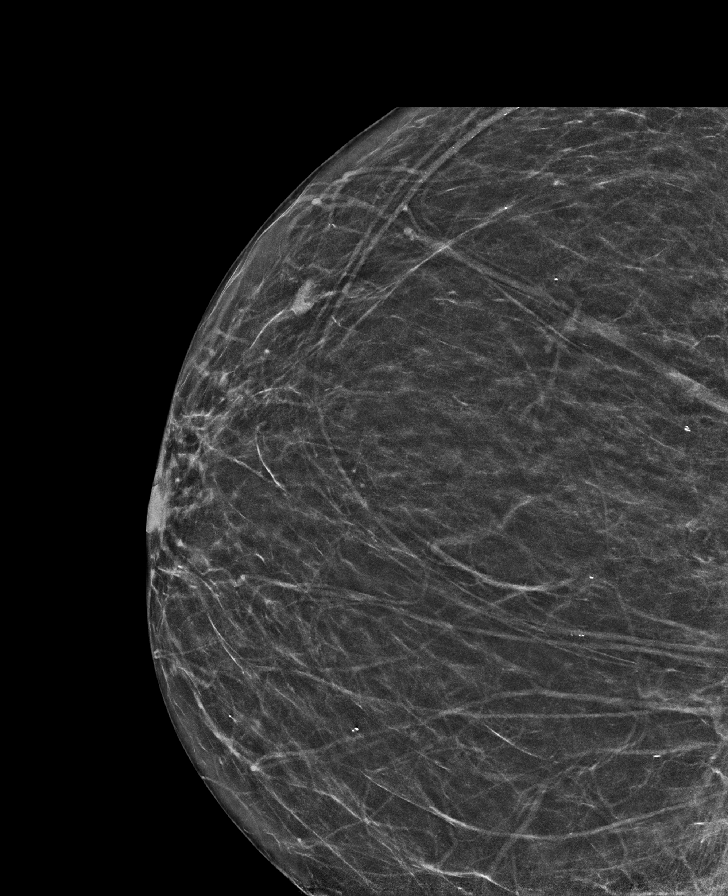

[R MLO tomo · tomo slice 62/91.0]
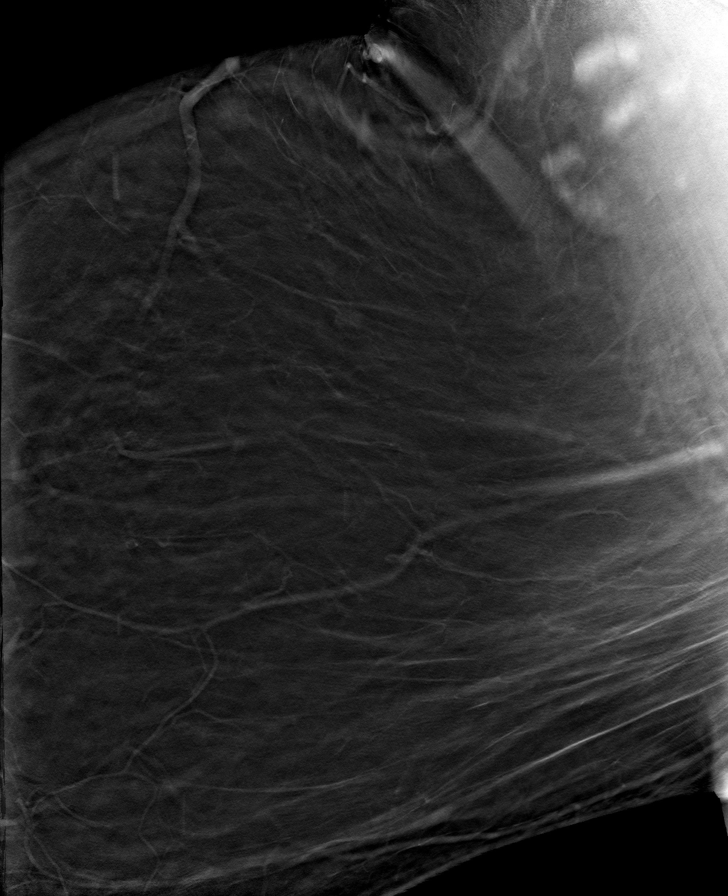

[8 of 40 positions shown; findings below may reference images not displayed]

ACR Breast Density Category b: There are scattered areas of
fibroglandular density.
FINDINGS: Stable postsurgical changes are noted in the left breast. No new or
suspicious findings are identified in either breast.

Mammographic images were processed with CAD.
IMPRESSION: 1. No mammographic evidence of malignancy in either breast.
2. Stable left breast posttreatment changes.

RECOMMENDATION:
Diagnostic mammogram is suggested in 1 year. (Code:[M6])

I have discussed the findings and recommendations with the patient.
Results were also provided in writing at the conclusion of the
visit. If applicable, a reminder letter will be sent to the patient
regarding the next appointment.

BI-RADS CATEGORY  2: Benign.

## 2018-06-12 ENCOUNTER — Other Ambulatory Visit: Payer: Medicare Other

## 2018-06-16 ENCOUNTER — Telehealth: Payer: Self-pay

## 2018-06-16 NOTE — Telephone Encounter (Signed)
I have not called this patient to my knowledge.  If I had, I would have left a voice message.  Thanks

## 2018-06-16 NOTE — Telephone Encounter (Signed)
Pt called LVM on nurse line stating, "someone called me from this number." I did not see any recent notes in chart. Will forward to pcp to see if she has tried contacting patient recently.

## 2018-06-17 ENCOUNTER — Encounter: Payer: Self-pay | Admitting: Endocrinology

## 2018-06-17 ENCOUNTER — Ambulatory Visit (INDEPENDENT_AMBULATORY_CARE_PROVIDER_SITE_OTHER): Payer: Medicare Other | Admitting: Endocrinology

## 2018-06-17 VITALS — BP 138/68 | HR 72 | Ht 63.0 in | Wt 294.0 lb

## 2018-06-17 DIAGNOSIS — E890 Postprocedural hypothyroidism: Secondary | ICD-10-CM

## 2018-06-17 DIAGNOSIS — Z23 Encounter for immunization: Secondary | ICD-10-CM

## 2018-06-17 NOTE — Progress Notes (Signed)
Patient ID: Sheila Jacobs, female   DOB: 09/29/1950, 67 y.o.   MRN: [id number]           Reason for Appointment: HYPOTHYROIDISM, follow-up    History of Present Illness:   The patient's thyroid enlargement was first discovered in 2009 probably on her routine physical exam  [facility]   She however does not know when her goiter first started    On her initial evaluation she did have needle biopsy of her large right-sided nodule which was benign  Clinically she was felt to have a colloid goiter, mostly right sided  Because of her local pressure symptoms with choking feeling, coughing she was referred for thyroidectomy which was done in June 2018 This had relieved her local pressure symptoms  RECENT history:  She is recently consistently on 150 mcg of levothyroxine supplementation for her postsurgical hypothyroidism  She had an increase in her dose in 06/2017 and subsequently increased further up to 150 mcg daily in 08/2017  She has complained on each visit  about various physical symptoms being related to taking too strong medication and does not understand the need to take thyroid supplementation She is now saying that when she takes her thyroid pill she feels a pressure or discomfort in her chest But on the day when she missed her medication she does not think it made much difference She does have a problem with history of reflux  Otherwise has been trying to take her levothyroxine regularly before breakfast consistently She does not complain of any palpitations, no unusual fatigue/cold intolerance  Her weight is recently leveled off appears to   TSH is again normal  Wt Readings from Last 3 Encounters:  06/17/18 294 lb (133.4 kg)  05/08/18 291 lb (132 kg)  04/30/18 293 lb (132.9 kg)      Lab Results  Component Value Date   FREET4 1.20 06/11/2018   FREET4 1.13 02/11/2018   FREET4 0.73 08/13/2017   TSH 1.16 06/11/2018   TSH 3.14 02/11/2018   TSH 12.472 (H) 12/28/2017       Allergies as of 06/17/2018      Reactions   Cortisone Rash   Effexor [venlafaxine] Palpitations   Lexapro [escitalopram] Palpitations   Heart racing      Medication List        Accurate as of 06/17/18 10:09 AM. Always use your most recent med list.          ALPRAZolam 0.25 MG tablet Commonly known as:  XANAX Take 1 tablet (0.25 mg total) by mouth 2 (two) times daily as needed. for anxiety   anastrozole 1 MG tablet Commonly known as:  ARIMIDEX Take 1 tablet (1 mg total) by mouth daily.   hydrochlorothiazide 25 MG tablet Commonly known as:  HYDRODIURIL Take 1 tablet by mouth daily.   ketoconazole 2 % cream Commonly known as:  NIZORAL APPLY TOPICALLY DAILY AS NEEDED FOR IRRITATION.   levothyroxine 150 MCG tablet Commonly known as:  SYNTHROID, LEVOTHROID Take 1 tablet (150 mcg total) by mouth daily.   pantoprazole 40 MG tablet Commonly known as:  PROTONIX TAKE ONE TABLET BY MOUTH ONCE DAILY   rivaroxaban 20 MG Tabs tablet Commonly known as:  XARELTO Take 1 tablet (20 mg total) by mouth daily with supper.       Allergies:  Allergies  Allergen Reactions  . Cortisone Rash  . Effexor [Venlafaxine] Palpitations  . Lexapro [Escitalopram] Palpitations  Heart racing    Past Medical History:  Diagnosis Date  . Acute saddle pulmonary embolism without acute cor pulmonale ([place]) 12/29/2017  . Anginal pain ([place])   . Anxiety   . Arthritis   . Bradycardia   . Breast cancer of upper-outer quadrant of left female breast ([place]) 01/31/2016  . Colon polyps   . GERD (gastroesophageal reflux disease)   . Heavy cigarette smoker    Quit 2015  . History of radiation therapy 06/18/16- 07/30/16   Left Breast 45 Gy in 25 fractions, Left Breast Boost 6 Gy in 3 fractions.   . Hyperlipidemia   . Hypertension   . Morbid obesity ([place])   . OSA (obstructive sleep apnea) 01/23/2018  . Palpitations 10/16/2016  . Personal history  of noncompliance with medical treatment, presenting hazards to health 08/27/2016  . Thyroid goiter   . Vertigo    on occasion    Past Surgical History:  Procedure Laterality Date  . BREAST LUMPECTOMY Left 2017   radiation  . BREAST LUMPECTOMY WITH RADIOACTIVE SEED AND SENTINEL LYMPH NODE BIOPSY Left 04/11/2016   Procedure: BREAST LUMPECTOMY WITH RADIOACTIVE SEED AND SENTINEL LYMPH NODE BIOPSY;  Surgeon: Excell Seltzer, MD;  Location: [facility];  Service: General;  Laterality: Left;  . COLONOSCOPY WITH PROPOFOL N/A 10/20/2015   Procedure: COLONOSCOPY WITH PROPOFOL;  Surgeon: Mauri Pole, MD;  [facility];  Service: Endoscopy;  Laterality: N/A;  . ESOPHAGOGASTRODUODENOSCOPY (EGD) WITH PROPOFOL N/A 10/20/2015   Procedure: ESOPHAGOGASTRODUODENOSCOPY (EGD) WITH PROPOFOL;  Surgeon: Mauri Pole, MD;  [facility]: [facility];  Service: Endoscopy;  Laterality: N/A;  . IR ANGIOGRAM PULMONARY BILATERAL SELECTIVE  12/29/2017  . IR ANGIOGRAM SELECTIVE EACH ADDITIONAL VESSEL  12/29/2017  . IR ANGIOGRAM SELECTIVE EACH ADDITIONAL VESSEL  12/29/2017  . IR INFUSION THROMBOL ARTERIAL INITIAL (MS)  12/29/2017  . IR INFUSION THROMBOL ARTERIAL INITIAL (MS)  12/29/2017  . IR THROMB F/U EVAL ART/VEN FINAL DAY (MS)  12/30/2017  . IR US GUIDE VASC ACCESS RIGHT  12/29/2017  . LEFT HEART CATHETERIZATION WITH CORONARY ANGIOGRAM N/A 10/21/2014   Procedure: LEFT HEART CATHETERIZATION WITH CORONARY ANGIOGRAM;  Surgeon: Burnell Blanks, MD;  Location: [facility];  Service: Cardiovascular;  Laterality: N/A;  . THYROIDECTOMY N/A 02/26/2017   Procedure: TOTAL THYROIDECTOMY;  Surgeon: Excell Seltzer, MD;  Location: [facility];  Service: General;  Laterality: N/A;  . TUBAL LIGATION      Family History  Problem Relation Age of Onset  . Hypertension Mother   . Heart disease Mother        Pacemaker  . Kidney disease Mother   . Cataracts Mother   . Hearing loss Mother   . Diabetes Mother   . Liver  disease Mother   . Diabetes Sister   . Cataracts Brother   . Hearing loss Brother   . Colon cancer Neg Hx   . Thyroid disease Neg Hx     Social History:  reports that she quit smoking about 3 years ago. Her smoking use included cigarettes. She has a 45.00 pack-year smoking history. She has never used smokeless tobacco. She reports that she does not drink alcohol or use drugs.    Review of Systems     Hypertension has been managed by PCP  Checks at the drugstore  BP Readings from Last 3 Encounters:  06/17/18 138/68  05/08/18 140/60  04/30/18 127/71       Examination:   BP 138/68   Pulse 72  Ht 5\' 3"  (1.6 m)   Wt 294 lb (133.4 kg)   SpO2 98%   BMI 52.08 kg/m           She looks well Normal anterior neck exam Biceps reflexes are normal.  Skin temperature appears normal   Assessment/Plan:  Postsurgical hypothyroidism  She is on supplementation with levothyroxine 150 mcg  Although she had been concerned about the medication causing side effects she is more comfortable with understanding that she needs the proper dosage of thyroid supplementation Again explained to her that her symptom of chest discomfort after taking the thyroid medication is unlikely to be from the medication itself and may be related to reflux  TSH is consistently normal and she is otherwise doing subjectively well Emphasized the need to take her levothyroxine every morning before breakfast and that she will need this lifelong  HYPERTENSION: She will continue follow-up with her PCP  Follow-up in 6 months  Abella Shugart 06/17/2018    Influenza vaccine given

## 2018-06-18 DIAGNOSIS — Z0100 Encounter for examination of eyes and vision without abnormal findings: Secondary | ICD-10-CM | POA: Diagnosis not present

## 2018-06-20 DIAGNOSIS — G4733 Obstructive sleep apnea (adult) (pediatric): Secondary | ICD-10-CM | POA: Diagnosis not present

## 2018-06-30 NOTE — Progress Notes (Signed)
Cardiology Office Note   Date:  07/04/2018   ID:  Sheila Jacobs, DOB December 01, 1950, MRN [id number]  PCP:  Kathrene Alu, MD Cardiologist:  Minus Breeding, MD 04/30/2018 Sleep medicine: Dr. Radford Pax, 05/08/2018 Rosaria Ferries, PA-C 01/23/2018  Chief Complaint  Patient presents with  . Follow-up    History of Present Illness: Sheila Jacobs is a 67 y.o. female with a history of PE 12/29/2017, OSA on CPAP, nl cors at cath 2016, hypothyroid  8/21 office visit with Dr. [name], follow-up echo in October as she had RV dysfunction with her PE, lifelong anticoagulation, blood pressure okay, echo 8/29 showed recovery of RV function 8/29 office visit with Dr. Radford Pax, patient compliant with CPAP and benefiting 06/17/2018 office visit with Dr. Dwyane Dee, no change in levothyroxine dose, chest discomfort after taking the medication may be related to reflux  [name] presents for cardiology follow up.  One of her medicines is making her cough, gives her a choking feeling.   When she takes the Xarelto, she takes it w/ supper and drinks liquids. After supper, she will get sharp pains across her chest and up into her neck, back and jaw.   She feels her heart pound at times. Says it is from the blood thinner.   She will cough and feel choked. She does not get these sx at other meals. However, she will start coughing after taking the levothyroxine and coughs all day.   Her BP is up today, she had Oodles of Noodles last night, but did not use the whole spice pack.   She was sitting watching TV all day long, not moving.   She is moving around more, but still not doing much. Says is willing to join a gym and exercise.   She walks to the store occasionally, does not feel safe doing that because of some dogs along the way.   She has developed a rash on her arms and trunk, neck and back. Says it is from the blood thinner. That is the only new medication she is on.   She feels she has  some lower extremity edema, but only trace pedal edema is noted.  She may have some lymphedema.   Past Medical History:  Diagnosis Date  . Acute saddle pulmonary embolism without acute cor pulmonale ([place]) 12/29/2017  . Anginal pain ([place])   . Anxiety   . Arthritis   . Bradycardia   . Breast cancer of upper-outer quadrant of left female breast ([place]) 01/31/2016  . Colon polyps   . GERD (gastroesophageal reflux disease)   . Heavy cigarette smoker    Quit 2015  . History of radiation therapy 10/9-11/20/17   Left Breast 45 Gy in 25 fractions, Left Breast Boost 6 Gy in 3 fractions.   . Hyperlipidemia   . Hypertension   . Morbid obesity ([place])   . OSA (obstructive sleep apnea) 01/23/2018  . Palpitations 10/16/2016  . Personal history of noncompliance with medical treatment, presenting hazards to health 08/27/2016  . Thyroid goiter   . Vertigo    on occasion    Past Surgical History:  Procedure Laterality Date  . BREAST LUMPECTOMY Left 2017   radiation  . BREAST LUMPECTOMY WITH RADIOACTIVE SEED AND SENTINEL LYMPH NODE BIOPSY Left 04/11/2016   Procedure: BREAST LUMPECTOMY WITH RADIOACTIVE SEED AND SENTINEL LYMPH NODE BIOPSY;  Surgeon: Excell Seltzer, MD;  Location: [facility];  Service: General;  Laterality: Left;  . COLONOSCOPY WITH PROPOFOL N/A 10/20/2015  Procedure: COLONOSCOPY WITH PROPOFOL;  Surgeon: Mauri Pole, MD;  Location: [facility];  Service: Endoscopy;  Laterality: N/A;  . ESOPHAGOGASTRODUODENOSCOPY (EGD) WITH PROPOFOL N/A 10/20/2015   Procedure: ESOPHAGOGASTRODUODENOSCOPY (EGD) WITH PROPOFOL;  Surgeon: Mauri Pole, MD;  Location: [facility];  Service: Endoscopy;  Laterality: N/A;  . IR ANGIOGRAM PULMONARY BILATERAL SELECTIVE  12/29/2017  . IR ANGIOGRAM SELECTIVE EACH ADDITIONAL VESSEL  12/29/2017  . IR ANGIOGRAM SELECTIVE EACH ADDITIONAL VESSEL  12/29/2017  . IR INFUSION THROMBOL ARTERIAL INITIAL (MS)  12/29/2017  . IR INFUSION THROMBOL ARTERIAL INITIAL (MS)  12/29/2017    . IR THROMB F/U EVAL ART/VEN FINAL DAY (MS)  12/30/2017  . IR US GUIDE VASC ACCESS RIGHT  12/29/2017  . LEFT HEART CATHETERIZATION WITH CORONARY ANGIOGRAM N/A 10/21/2014   Procedure: LEFT HEART CATHETERIZATION WITH CORONARY ANGIOGRAM;  Surgeon: Burnell Blanks, MD;  Location: [facility] CATH LAB;  Service: Cardiovascular;  Laterality: N/A;  . THYROIDECTOMY N/A 02/26/2017   Procedure: TOTAL THYROIDECTOMY;  Surgeon: Excell Seltzer, MD;  Location: [facility];  Service: General;  Laterality: N/A;  . TUBAL LIGATION      Current Outpatient Medications  Medication Sig Dispense Refill  . ALPRAZolam (XANAX) 0.25 MG tablet Take 1 tablet (0.25 mg total) by mouth 2 (two) times daily as needed. for anxiety 15 tablet 0  . anastrozole (ARIMIDEX) 1 MG tablet Take 1 tablet (1 mg total) by mouth daily. 90 tablet 3  . hydrochlorothiazide (HYDRODIURIL) 25 MG tablet Take 1 tablet by mouth daily.    Marland Kitchen ketoconazole (NIZORAL) 2 % cream  APPLY TOPICALLY DAILY AS NEEDED FOR IRRITATION. 30 g 1  . levothyroxine (SYNTHROID, LEVOTHROID) 150 MCG tablet Take 1 tablet (150 mcg total) by mouth daily. 30 tablet 3  . pantoprazole (PROTONIX) 40 MG tablet TAKE ONE TABLET BY MOUTH ONCE DAILY 30 tablet 11  . rivaroxaban (XARELTO) 20 MG TABS tablet Take 1 tablet (20 mg total) by mouth daily with supper. 30 tablet 4   No current facility-administered medications for this visit.     Allergies:   Cortisone; Effexor [venlafaxine]; and Lexapro [escitalopram]    Social History:  The patient  reports that she quit smoking about 3 years ago. Her smoking use included cigarettes. She has a 45.00 pack-year smoking history. She has never used smokeless tobacco. She reports that she does not drink alcohol or use drugs.   Family History:  The patient's family history includes Cataracts in her brother and mother; Diabetes in her mother and sister; Hearing loss in her brother and mother; Heart disease in her mother; Hypertension in her mother;  Kidney disease in her mother; Liver disease in her mother.  She indicated that her mother is deceased. She indicated that her father is deceased. She indicated that her sister is deceased. She indicated that her brother is alive. She indicated that her maternal grandmother is deceased. She indicated that her maternal grandfather is deceased. She indicated that her paternal grandmother is deceased. She indicated that her paternal grandfather is deceased. She indicated that the status of her neg hx is unknown.   ROS:  Please see the history of present illness. All other systems are reviewed and negative.    PHYSICAL EXAM: VS:  BP 140/80   Pulse 72   Ht 5\' 3"  (1.6 m)   Wt 294 lb 9.6 oz (133.6 kg)   BMI 52.19 kg/m  , BMI Body mass index is 52.19 kg/m. GEN: Well nourished, well developed, female in no acute distress  HEENT: normal for age, poor dentition Neck: no JVD, no carotid bruit, no masses Cardiac: RRR; no murmur, no rubs, or gallops Respiratory: Some rales bases that clear with repeated inhalation, likely atelectasis, normal work of breathing GI: soft, nontender, nondistended, + BS MS: no deformity or atrophy; trace pedal edema; distal pulses are 2+ in all 4 extremities  Skin: warm and dry, no rash Neuro:  Strength and sensation are intact Psych: euthymic mood, full affect   EKG:  EKG is not ordered today.  ECHO: 05/08/2018 - Left ventricle: The cavity size was normal. Systolic function was   normal. Wall motion was normal; there were no regional wall   motion abnormalities. There was an increased relative   contribution of atrial contraction to ventricular filling, which   may be due to aging or hypovolemia. Left ventricular diastolic   function parameters were normal. - Aortic arch: The aortic arch had moderate diffuse disease.  Impressions:  - Compared to April 2019, right ventricular size and function has   returned to normal.   MONITOR: 30-day monitor 10/2016 showed  sinus rhythm with PACs and PVCs, no significant ectopy   Recent Labs: 12/28/2017: ALT 11; B Natriuretic Peptide 592.6 01/02/2018: Hemoglobin 11.0; Magnesium 2.1; Platelets 129 02/11/2018: BUN 10; Creatinine, Ser 0.77; Potassium 3.6; Sodium 140 06/11/2018: TSH 1.16  CBC    Component Value Date/Time   WBC 3.2 (L) 01/02/2018 2057   RBC 3.42 (L) 01/02/2018 2057   HGB 11.0 (L) 01/02/2018 2057   HGB 12.2 11/25/2017 1026   HGB 11.2 (L) 08/29/2016 0856   HCT 33.4 (L) 01/02/2018 2057   HCT 33.9 (L) 08/29/2016 0856   PLT 129 (L) 01/02/2018 2057   PLT 238 11/25/2017 1026   PLT 153 08/29/2016 0856   MCV 97.7 01/02/2018 2057   MCV 95.0 08/29/2016 0856   MCH 32.2 01/02/2018 2057   MCHC 32.9 01/02/2018 2057   RDW 13.4 01/02/2018 2057   RDW 12.6 08/29/2016 0856   LYMPHSABS 1.1 01/02/2018 2057   LYMPHSABS 1.3 08/29/2016 0856   MONOABS 0.4 01/02/2018 2057   MONOABS 0.3 08/29/2016 0856   EOSABS 0.2 01/02/2018 2057   EOSABS 0.1 08/29/2016 0856   BASOSABS 0.0 01/02/2018 2057   BASOSABS 0.0 08/29/2016 0856   CMP Latest Ref Rng & Units 02/11/2018 01/02/2018 01/01/2018  Glucose 70 - 99 mg/dL 112(H) 124(H) 124(H)  BUN 6 - 23 mg/dL 10 5(L) 7  Creatinine 0.40 - 1.20 mg/dL 0.77 0.87 0.79  Sodium 135 - 145 mEq/L 140 140 137  Potassium 3.5 - 5.1 mEq/L 3.6 3.8 3.8  Chloride 96 - 112 mEq/L 104 108 105  CO2 19 - 32 mEq/L 30 24 24   Calcium 8.4 - 10.5 mg/dL 10.1 8.9 8.7(L)  Total Protein 6.5 - 8.1 g/dL - - -  Total Bilirubin 0.3 - 1.2 mg/dL - - -  Alkaline Phos 38 - 126 U/L - - -  AST 15 - 41 U/L - - -  ALT 14 - 54 U/L - - -     Lipid Panel    Component Value Date/Time   CHOL 168 08/09/2016 1016   TRIG 39 08/09/2016 1016   HDL 65 08/09/2016 1016   CHOLHDL 2.6 08/09/2016 1016   VLDL 8 08/09/2016 1016   LDLCALC 95 08/09/2016 1016     Wt Readings from Last 3 Encounters:  07/04/18 294 lb 9.6 oz (133.6 kg)  06/17/18 294 lb (133.4 kg)  05/08/18 291 lb (132 kg)     Other  studies  Reviewed: Additional studies/ records that were reviewed today include: Office notes, hospital records and testing.  ASSESSMENT AND PLAN:  1.  Lower extremity edema: She has minimal pitting edema, may have some lymphedema. - I requested she take extra HCTZ today and tomorrow. -She admitted that she sometimes gets more sodium than she should, she is encouraged to limit this. - She may also be over drinking, she is encouraged to cut back a little on that.  2.  Chest pain: Her symptoms are atypical and occur after swallowing pills. - Her anticoagulant is being changed.  She feels this medication is responsible for her symptoms -If her symptoms do not improve, discuss with MD GI eval - No clear indication for ischemic eval at this time, continue to follow  3.  Chronic anticoagulation, Drug rash: -She has a rash with Xarelto -She strongly wants to come off the blood thinner, but I reviewed Dr. Rosezella [place] last note with her and stated that he is the only one that can authorize her to come off her blood thinners. - Stop Xarelto -Start Eliquis.  She was given a week's worth of samples and a 30-day free card. -Follow-up with Dr. [name] or myself in less than 5 weeks and determine if she can come off the blood thinner  4.  History of PE: -Dr. [name] considered the PE unprovoked and and when he combined that with her history of breast cancer, felt that lifelong anticoagulation was needed. - However, the patient feels the PE was from her sitting on the couch 8 or more hours a day continuously, watching TV. - She says she is willing to join a gym and become more active.  She says she is already more active than she was. - I advised that if she would indeed join a gym, become more active and lose some weight, I would speak with Dr. [name] about stopping the Eliquis. -For now, continue it   5.  Hyperlipidemia: Her last lipid profile was July 2018 -At that time, total cholesterol 206,  triglycerides were 79, HDL 60 and LDL 130 -Recheck today  6.  Hypertension: Her blood pressure is a little bit elevated, but she admits to recent overindulgence in sodium. -She states her blood pressure does not usually run that high, follow.   Current medicines are reviewed at length with the patient today.  The patient has concerns regarding medicines.  The following changes have been made: Stop the Xarelto, start Eliquis  Labs/ tests ordered today include:  No orders of the defined types were placed in this encounter.    Disposition:   FU with Minus Breeding, MD  Signed, Rosaria Ferries, PA-C  07/04/2018 10:18 AM    [place] Phone: [phone]; Fax: [phone]  This note was written with the assistance of speech recognition software.  Please excuse any transcriptional errors.

## 2018-07-04 ENCOUNTER — Ambulatory Visit (INDEPENDENT_AMBULATORY_CARE_PROVIDER_SITE_OTHER): Payer: Medicare Other | Admitting: Physician Assistant

## 2018-07-04 ENCOUNTER — Encounter: Payer: Self-pay | Admitting: Physician Assistant

## 2018-07-04 VITALS — BP 140/80 | HR 72 | Ht 63.0 in | Wt 294.6 lb

## 2018-07-04 DIAGNOSIS — R600 Localized edema: Secondary | ICD-10-CM

## 2018-07-04 DIAGNOSIS — Z86711 Personal history of pulmonary embolism: Secondary | ICD-10-CM

## 2018-07-04 DIAGNOSIS — E785 Hyperlipidemia, unspecified: Secondary | ICD-10-CM | POA: Diagnosis not present

## 2018-07-04 DIAGNOSIS — I10 Essential (primary) hypertension: Secondary | ICD-10-CM

## 2018-07-04 DIAGNOSIS — R0789 Other chest pain: Secondary | ICD-10-CM

## 2018-07-04 DIAGNOSIS — L270 Generalized skin eruption due to drugs and medicaments taken internally: Secondary | ICD-10-CM

## 2018-07-04 LAB — LIPID PANEL
CHOL/HDL RATIO: 2.5 ratio (ref 0.0–4.4)
Cholesterol, Total: 174 mg/dL (ref 100–199)
HDL: 71 mg/dL (ref >39)
LDL CALC: 92 mg/dL (ref 0–99)
Triglycerides: 55 mg/dL (ref 0–149)
VLDL CHOLESTEROL CAL: 11 mg/dL (ref 5–40)

## 2018-07-04 MED ORDER — APIXABAN 5 MG PO TABS: 5.0000 mg | ORAL_TABLET | Freq: Two times a day (BID) | ORAL | 1 refills | Status: DC

## 2018-07-04 NOTE — Patient Instructions (Signed)
Medication Instructions:  Take extra 2 tablets of HCTZ. STOP Xarelto START Eliquis with this evenings dose. If you need a refill on your cardiac medications before your next appointment, please call your pharmacy.   Lab work: Lipid Panel If you have labs (blood work) drawn today and your tests are completely normal, you will receive your results only by: Marland Kitchen MyChart Message (if you have MyChart) OR . A paper copy in the mail If you have any lab test that is abnormal or we need to change your treatment, we will call you to review the results.  Testing/Procedures: None Ordered  Follow-Up: At [facility], you and your health needs are our priority.  As part of our continuing mission to provide you with exceptional heart care, we have created designated Provider Care Teams.  These Care Teams include your primary Cardiologist (physician) and Advanced Practice Providers (APPs -  Physician Assistants and Nurse Practitioners) who all work together to provide you with the care you need, when you need it. . Follow up in LESS THAN 5 weeks with Dr.Hochrein, or Rhonda Barrett, PA-C.  Any Other Special Instructions Will Be Listed Below (If Applicable). None

## 2018-07-05 DIAGNOSIS — R269 Unspecified abnormalities of gait and mobility: Secondary | ICD-10-CM | POA: Diagnosis not present

## 2018-07-07 ENCOUNTER — Telehealth: Payer: Self-pay | Admitting: Physician Assistant

## 2018-07-07 NOTE — Telephone Encounter (Signed)
New message   Pt c/o medication issue:  1. Name of Medication: apixaban (ELIQUIS) 5 MG TABS tablet  2. How are you currently taking this medication (dosage and times per day)? Take 1 tablet (5 mg total) by mouth 2 (two) times daily.  3. Are you having a reaction (difficulty breathing--STAT)? No  4. What is your medication issue? Patient would like to know if she takes one or 2 tablets daily.   Patient is also returning call for lab results.

## 2018-07-07 NOTE — Telephone Encounter (Signed)
Spoke with patient. Reviewed that eliquis BID is replacing xarelto QD. She should take eliquis 1 tablet about every 12 hours.   She was also notified of lipid panel results

## 2018-07-17 ENCOUNTER — Other Ambulatory Visit: Payer: Self-pay | Admitting: *Deleted

## 2018-07-17 ENCOUNTER — Other Ambulatory Visit: Payer: Self-pay | Admitting: Endocrinology

## 2018-07-17 MED ORDER — HYDROCHLOROTHIAZIDE 25 MG PO TABS: 25.0000 mg | ORAL_TABLET | Freq: Every day | ORAL | 3 refills | Status: DC

## 2018-07-21 DIAGNOSIS — G4733 Obstructive sleep apnea (adult) (pediatric): Secondary | ICD-10-CM | POA: Diagnosis not present

## 2018-07-22 ENCOUNTER — Ambulatory Visit (INDEPENDENT_AMBULATORY_CARE_PROVIDER_SITE_OTHER): Payer: Medicare Other | Admitting: Physician Assistant

## 2018-07-22 ENCOUNTER — Encounter: Payer: Self-pay | Admitting: Physician Assistant

## 2018-07-22 VITALS — BP 106/58 | HR 74 | Ht 63.0 in | Wt 287.0 lb

## 2018-07-22 DIAGNOSIS — I10 Essential (primary) hypertension: Secondary | ICD-10-CM

## 2018-07-22 DIAGNOSIS — Z7901 Long term (current) use of anticoagulants: Secondary | ICD-10-CM | POA: Diagnosis not present

## 2018-07-22 DIAGNOSIS — I5032 Chronic diastolic (congestive) heart failure: Secondary | ICD-10-CM

## 2018-07-22 DIAGNOSIS — R252 Cramp and spasm: Secondary | ICD-10-CM

## 2018-07-22 DIAGNOSIS — Z86711 Personal history of pulmonary embolism: Secondary | ICD-10-CM | POA: Diagnosis not present

## 2018-07-22 LAB — BASIC METABOLIC PANEL
BUN / CREAT RATIO: 10 — AB (ref 12–28)
BUN: 8 mg/dL (ref 8–27)
CHLORIDE: 99 mmol/L (ref 96–106)
CO2: 26 mmol/L (ref 20–29)
Calcium: 9.7 mg/dL (ref 8.7–10.3)
Creatinine, Ser: 0.79 mg/dL (ref 0.57–1.00)
GFR calc Af Amer: 88 mL/min/{1.73_m2} (ref >59)
GFR calc non Af Amer: 77 mL/min/{1.73_m2} (ref >59)
Glucose: 94 mg/dL (ref 65–99)
POTASSIUM: 3 mmol/L — AB (ref 3.5–5.2)
Sodium: 139 mmol/L (ref 134–144)

## 2018-07-22 NOTE — Patient Instructions (Addendum)
Medication Instructions:  CONTINUE TAKING ELIQUIS TWICE DAILY If you need a refill on your cardiac medications before your next appointment, please call your pharmacy.   Lab work: Your physician recommends that you return for lab work in: TODAY-BMET If you have labs (blood work) drawn today and your tests are completely normal, you will receive your results only by: Marland Kitchen MyChart Message (if you have MyChart) OR . A paper copy in the mail If you have any lab test that is abnormal or we need to change your treatment, we will call you to review the results.  Testing/Procedures: NONE  Follow-Up: At [facility], you and your health needs are our priority.  As part of our continuing mission to provide you with exceptional heart care, we have created designated Provider Care Teams.  These Care Teams include your primary Cardiologist (physician) and Advanced Practice Providers (APPs -  Physician Assistants and Nurse Practitioners) who all work together to provide you with the care you need, when you need it. . Your physician recommends that you schedule a follow-up appointment in: 3 MONTHS WITH DR [place].  Any Other Special Instructions Will Be Listed Below (If Applicable). ONLY EAT 500MG  OF SODIUM PER MEAL DRINK NO MORE THAN 2 LITERS OF FLUID PER DAY CHECK YOUR WEIGHT DAILY

## 2018-07-22 NOTE — Progress Notes (Addendum)
Cardiology Office Note   Date:  07/22/2018   ID:  Sheila Jacobs, DOB 12-Dec-1950, MRN [id number]  PCP:  Kathrene Alu, MD Cardiologist:  Minus Breeding, MD 04/30/2018 Sleep Medicine: Dr. Radford Pax, 05/08/2018 Rosaria Ferries, PA-C 07/04/2018  Chief Complaint  Patient presents with  . Follow-up    5 WEEK FOLLOWUP    History of Present Illness: Sheila Jacobs is a 67 y.o. female with a history of PE 12/29/2017 with lifelong anticoagulation recommended because of RV dysfunction on echo, OSA on CPAP, nl cors at cath 2016, hypothyroid  10/25 office visit, patient having a rash and other side effects from Xarelto>>Eliquis, extra HCTZ x2 days for edema, trouble swallowing pills, patient to discuss with GI, patient does not want lifelong anticoagulation, so she will become more active and asked to come off of it.  Lipids checked  Sheila Jacobs presents for cardiology follow up.  She feels the Eliquis gave her cramps in fingers and a headache.   She has moved and feels safer where she is now. She is able to walk for exercise. She goes to the store and around locally, is trying to get 15" 2 x day. She can walk in the building in bad weather.   She is having cramps in her legs, wonders about PAD. The cramps are in her calves, at rest and when she walks. She also has pain in her legs below her kneecaps. She eats bananas even though she dislikes them to get the potassium.   She is having pain in her right arm as well as all the way down her right leg.  She thinks this might be from the Eliquis.  She has cut back on Oodles of Noodles, using only part of the spice packet.  She is trying to minimize the sodium in her diet  She feels that her breathing is at baseline, the CPAP machine helps her.   Since changing from Xarelto to Eliquis, the rash has resolved.   Past Medical History:  Diagnosis Date  . Acute saddle pulmonary embolism without acute cor pulmonale ([place]) 12/29/2017  .  Anginal pain ([place])   . Anxiety   . Arthritis   . Bradycardia   . Breast cancer of upper-outer quadrant of left female breast ([place]) 01/31/2016  . Colon polyps   . GERD (gastroesophageal reflux disease)   . Heavy cigarette smoker    Quit 2015  . History of radiation therapy 10/9-11/20/17   Left Breast 45 Gy in 25 fractions, Left Breast Boost 6 Gy in 3 fractions.   . Hyperlipidemia   . Hypertension   . Morbid obesity ([place])   . OSA (obstructive sleep apnea) 01/23/2018  . Palpitations 10/16/2016  . Personal history of noncompliance with medical treatment, presenting hazards to health 08/27/2016  . Thyroid goiter   . Vertigo    on occasion    Past Surgical History:  Procedure Laterality Date  . BREAST LUMPECTOMY Left 2017   radiation  . BREAST LUMPECTOMY WITH RADIOACTIVE SEED AND SENTINEL LYMPH NODE BIOPSY Left 04/11/2016   Procedure: BREAST LUMPECTOMY WITH RADIOACTIVE SEED AND SENTINEL LYMPH NODE BIOPSY;  Surgeon: Excell Seltzer, MD;  Location: [facility];  Service: General;  Laterality: Left;  . COLONOSCOPY WITH PROPOFOL N/A 10/20/2015   Procedure: COLONOSCOPY WITH PROPOFOL;  Surgeon: Mauri Pole, MD;  Location: [facility];  Service: Endoscopy;  Laterality: N/A;  . ESOPHAGOGASTRODUODENOSCOPY (EGD) WITH PROPOFOL N/A 10/20/2015   Procedure: ESOPHAGOGASTRODUODENOSCOPY (EGD) WITH PROPOFOL;  Surgeon: Mauri Pole, MD;  Location: [facility];  Service: Endoscopy;  Laterality: N/A;  . IR ANGIOGRAM PULMONARY BILATERAL SELECTIVE  12/29/2017  . IR ANGIOGRAM SELECTIVE EACH ADDITIONAL VESSEL  12/29/2017  . IR ANGIOGRAM SELECTIVE EACH ADDITIONAL VESSEL  12/29/2017  . IR INFUSION THROMBOL ARTERIAL INITIAL (MS)  12/29/2017  . IR INFUSION THROMBOL ARTERIAL INITIAL (MS)  12/29/2017  . IR THROMB F/U EVAL ART/VEN FINAL DAY (MS)  12/30/2017  . IR US GUIDE VASC ACCESS RIGHT  12/29/2017  . LEFT HEART CATHETERIZATION WITH CORONARY ANGIOGRAM N/A 10/21/2014   Procedure: LEFT HEART CATHETERIZATION WITH  CORONARY ANGIOGRAM;  Surgeon: Burnell Blanks, MD;  Location: [facility] CATH LAB;  Service: Cardiovascular;  Laterality: N/A;  . THYROIDECTOMY N/A 02/26/2017   Procedure: TOTAL THYROIDECTOMY;  Surgeon: Excell Seltzer, MD;  Location: [facility];  Service: General;  Laterality: N/A;  . TUBAL LIGATION      Current Outpatient Medications  Medication Sig Dispense Refill  . ALPRAZolam (XANAX) 0.25 MG tablet Take 1 tablet (0.25 mg total) by mouth 2 (two) times daily as needed. for anxiety 15 tablet 0  . anastrozole (ARIMIDEX) 1 MG tablet Take 1 tablet (1 mg total) by mouth daily. 90 tablet 3  . apixaban (ELIQUIS) 5 MG TABS tablet Take 1 tablet (5 mg total) by mouth 2 (two) times daily. 60 tablet 1  . hydrochlorothiazide (HYDRODIURIL) 25 MG tablet Take 1 tablet (25 mg total) by mouth daily. 90 tablet 3  . ketoconazole (NIZORAL) 2 % cream  APPLY TOPICALLY DAILY AS NEEDED FOR IRRITATION. 30 g 1  . levothyroxine (SYNTHROID, LEVOTHROID) 150 MCG tablet Take 1 tablet (150 mcg total) by mouth daily. 30 tablet 3  . pantoprazole (PROTONIX) 40 MG tablet TAKE ONE TABLET BY MOUTH ONCE DAILY 30 tablet 11   No current facility-administered medications for this visit.     Allergies:   Cortisone; Effexor [venlafaxine]; Lexapro [escitalopram]; and Xarelto [rivaroxaban]    Social History:  The patient  reports that she quit smoking about 4 years ago. Her smoking use included cigarettes. She has a 45.00 pack-year smoking history. She has never used smokeless tobacco. She reports that she does not drink alcohol or use drugs.   Family History:  The patient's family history includes Cataracts in her brother and mother; Diabetes in her mother and sister; Hearing loss in her brother and mother; Heart disease in her mother; Hypertension in her mother; Kidney disease in her mother; Liver disease in her mother.  She indicated that her mother is deceased. She indicated that her father is deceased. She indicated that her sister  is deceased. She indicated that her brother is alive. She indicated that her maternal grandmother is deceased. She indicated that her maternal grandfather is deceased. She indicated that her paternal grandmother is deceased. She indicated that her paternal grandfather is deceased. She indicated that the status of her neg hx is unknown.   ROS:  Please see the history of present illness. All other systems are reviewed and negative.    PHYSICAL EXAM: VS:  BP (!) 106/58   Pulse 74   Ht 5\' 3"  (1.6 m)   Wt 287 lb (130.2 kg)   SpO2 93%   BMI 50.84 kg/m  , BMI Body mass index is 50.84 kg/m. GEN: Well nourished, well developed, female in no acute distress HEENT: normal for age  Neck: no JVD, no carotid bruit, no masses Cardiac: RRR; no murmur, no rubs, or gallops Respiratory: Decreased breath sounds bases bilaterally,  normal work of breathing GI: soft, nontender, nondistended, + BS MS: no deformity or atrophy; trace lower extremity edema; distal pulses are 2+ in all 4 extremities, carefully assessed in her lower extremities and were strong Skin: warm and dry, no rash Neuro:  Strength and sensation are intact Psych: euthymic mood, full affect   EKG:  EKG is not ordered today.  ECHO: 04/28/2018 - Left ventricle: The cavity size was normal. Systolic function was   normal. Wall motion was normal; there were no regional wall   motion abnormalities. There was an increased relative   contribution of atrial contraction to ventricular filling, which   may be due to aging or hypovolemia. Left ventricular diastolic   function parameters were normal. - Aortic arch: The aortic arch had moderate diffuse disease.  Impressions:  - Compared to April 2019, right ventricular size and function has   returned to normal.  Recent Labs: 12/28/2017: ALT 11; B Natriuretic Peptide 592.6 01/02/2018: Hemoglobin 11.0; Magnesium 2.1; Platelets 129 02/11/2018: BUN 10; Creatinine, Ser 0.77; Potassium 3.6; Sodium  140 06/11/2018: TSH 1.16  CBC    Component Value Date/Time   WBC 3.2 (L) 01/02/2018 2057   RBC 3.42 (L) 01/02/2018 2057   HGB 11.0 (L) 01/02/2018 2057   HGB 12.2 11/25/2017 1026   HGB 11.2 (L) 08/29/2016 0856   HCT 33.4 (L) 01/02/2018 2057   HCT 33.9 (L) 08/29/2016 0856   PLT 129 (L) 01/02/2018 2057   PLT 238 11/25/2017 1026   PLT 153 08/29/2016 0856   MCV 97.7 01/02/2018 2057   MCV 95.0 08/29/2016 0856   MCH 32.2 01/02/2018 2057   MCHC 32.9 01/02/2018 2057   RDW 13.4 01/02/2018 2057   RDW 12.6 08/29/2016 0856   LYMPHSABS 1.1 01/02/2018 2057   LYMPHSABS 1.3 08/29/2016 0856   MONOABS 0.4 01/02/2018 2057   MONOABS 0.3 08/29/2016 0856   EOSABS 0.2 01/02/2018 2057   EOSABS 0.1 08/29/2016 0856   BASOSABS 0.0 01/02/2018 2057   BASOSABS 0.0 08/29/2016 0856   CMP Latest Ref Rng & Units 02/11/2018 01/02/2018 01/01/2018  Glucose 70 - 99 mg/dL 112(H) 124(H) 124(H)  BUN 6 - 23 mg/dL 10 5(L) 7  Creatinine 0.40 - 1.20 mg/dL 0.77 0.87 0.79  Sodium 135 - 145 mEq/L 140 140 137  Potassium 3.5 - 5.1 mEq/L 3.6 3.8 3.8  Chloride 96 - 112 mEq/L 104 108 105  CO2 19 - 32 mEq/L 30 24 24   Calcium 8.4 - 10.5 mg/dL 10.1 8.9 8.7(L)  Total Protein 6.5 - 8.1 g/dL - - -  Total Bilirubin 0.3 - 1.2 mg/dL - - -  Alkaline Phos 38 - 126 U/L - - -  AST 15 - 41 U/L - - -  ALT 14 - 54 U/L - - -     Lipid Panel    Component Value Date/Time   CHOL 174 07/04/2018 1042   TRIG 55 07/04/2018 1042   HDL 71 07/04/2018 1042   CHOLHDL 2.5 07/04/2018 1042   CHOLHDL 2.6 08/09/2016 1016   VLDL 8 08/09/2016 1016   LDLCALC 92 07/04/2018 1042     Wt Readings from Last 3 Encounters:  07/22/18 287 lb (130.2 kg)  07/04/18 294 lb 9.6 oz (133.6 kg)  06/17/18 294 lb (133.4 kg)     Other studies Reviewed:  Additional studies/ records that were reviewed today include: Office notes, hospital records and testing.  ASSESSMENT AND PLAN:  1.  PE:  - Her respiratory status is improved and she is  ambulating more. -  Patient is encouraged to be compliant with lifelong anticoagulation.  2.  Chronic anticoagulation: -She did not tolerate Xarelto due to a rash. -She is not sure that she tolerates the Eliquis. -Her symptoms are not the traditional symptoms that are related to a drug reaction. - I explained that the only other drug we could put her on was Coumadin but that she did need to be on a blood thinner - When I explained the pros and cons of Coumadin, she decided she would keep taking the Eliquis and promises to take it twice a day.  3.  Hypertension: Her blood pressure is well controlled on the HCTZ, continue this. -Because of her tendency to put on fluid, I will not decrease the dose at this time  4.  Leg cramps: -Check a BMET to make sure her potassium level is good. - She may need oral supplement.  5.  Chronic diastolic CHF: -Her weight is decreased and her breathing is at baseline -Continue diuretic therapy   Current medicines are reviewed at length with the patient today.  The patient has concerns regarding medicines.  Concerns were addressed  The following changes have been made:  no change  Labs/ tests ordered today include:   Orders Placed This Encounter  Procedures  . Basic Metabolic Panel (BMET)     Disposition:   FU with Minus Breeding, MD  Signed, Rosaria Ferries, PA-C  07/22/2018 11:08 AM    [place] Phone: [phone]; Fax: [phone]  This note was written with the assistance of speech recognition software.  Please excuse any transcriptional errors.

## 2018-07-23 ENCOUNTER — Other Ambulatory Visit: Payer: Self-pay

## 2018-07-23 ENCOUNTER — Telehealth: Payer: Self-pay

## 2018-07-23 MED ORDER — POTASSIUM CHLORIDE ER 10 MEQ PO TBCR: EXTENDED_RELEASE_TABLET | ORAL | 0 refills | Status: DC

## 2018-07-23 MED ORDER — POTASSIUM CHLORIDE ER 10 MEQ PO TBCR: EXTENDED_RELEASE_TABLET | ORAL | 2 refills | Status: DC

## 2018-07-23 NOTE — Telephone Encounter (Signed)
Spoke with patient and gave her the results of her recent lab results. She stated the Eliquis is causing her some sharpe chest pain. She states it feels like needle pin pains all over her chest, she wanted to know if she can stop this medication. She wanted me to ask Dr [name] if she could stop the medication. She stated she asked Suanne Marker but was told to discuss it with Dr [name] at her next appt (10/22/18). Advised patient that I would speak with Dr [name] and get back with her. She voiced understanding.

## 2018-07-27 NOTE — Telephone Encounter (Signed)
She can change to another anticoagulant and could discuss warfarin with our Coumadin clinic.  Unfortunately, without blood thinner I believe that she is at risk for fatal pulmonary embolism if she comes off of blood thinner.

## 2018-07-28 ENCOUNTER — Telehealth: Payer: Self-pay

## 2018-07-28 NOTE — Telephone Encounter (Signed)
Patient called and said that cardiologist wants her to take her BP daily. She is not supposed to use her left arm but the machine at [facility] only lets you use left arm.  Wants to come here everyday or get machine for at home. Does a prescription need to be written for a home BP machine?  Call back is [phone]  Danley Danker, RN [facility] RN)

## 2018-07-28 NOTE — Telephone Encounter (Signed)
Called and spoke with pt to give her Dr [name] recommendation, pt stated that she has started taking her potassium and since taking it she has not gotten any chest pain, pt is will continue to take her Eliquis and will call us back if she has any more issue.

## 2018-07-29 NOTE — Telephone Encounter (Signed)
She will need to make an appointment to see if these medications are medically necessary.  Thanks!

## 2018-07-29 NOTE — Telephone Encounter (Signed)
Pt informed of PCP recommendations, pt agreeable to take BP at out patient pharmacy. While on the phone pt scheduled an apt with pcp and requesting her "hand cream" and xanax. Please advise

## 2018-07-29 NOTE — Telephone Encounter (Signed)
It is not necessary for me to write a prescription for blood pressure machine, and her BP was well controlled at her last visit on 11/12, so I do not think Sheila Jacobs needs to have her BP checked every day.  She can instead go to any outpatient pharmacy once or twice per week to have her blood pressure checked.  It is also okay with me if she wants to come to our office to get it checked about once a week, but please let me know if that is not feasible for the nurse clinic.

## 2018-07-31 NOTE — Telephone Encounter (Signed)
Pt informed of below and she will wait to see PCP to discuss getting these medications. Katharina Caper, April D, [place]

## 2018-08-05 DIAGNOSIS — R269 Unspecified abnormalities of gait and mobility: Secondary | ICD-10-CM | POA: Diagnosis not present

## 2018-08-15 ENCOUNTER — Other Ambulatory Visit: Payer: Self-pay | Admitting: Physician Assistant

## 2018-08-20 DIAGNOSIS — G4733 Obstructive sleep apnea (adult) (pediatric): Secondary | ICD-10-CM | POA: Diagnosis not present

## 2018-08-21 ENCOUNTER — Other Ambulatory Visit: Payer: Self-pay

## 2018-08-21 ENCOUNTER — Ambulatory Visit (INDEPENDENT_AMBULATORY_CARE_PROVIDER_SITE_OTHER): Payer: Medicare Other | Admitting: Family Medicine

## 2018-08-21 ENCOUNTER — Encounter: Payer: Self-pay | Admitting: Family Medicine

## 2018-08-21 VITALS — BP 130/62 | HR 65 | Temp 98.1°F | Ht 63.0 in | Wt 292.4 lb

## 2018-08-21 DIAGNOSIS — M7989 Other specified soft tissue disorders: Secondary | ICD-10-CM | POA: Diagnosis not present

## 2018-08-21 DIAGNOSIS — F411 Generalized anxiety disorder: Secondary | ICD-10-CM

## 2018-08-21 DIAGNOSIS — B354 Tinea corporis: Secondary | ICD-10-CM

## 2018-08-21 DIAGNOSIS — R0789 Other chest pain: Secondary | ICD-10-CM

## 2018-08-21 DIAGNOSIS — Z23 Encounter for immunization: Secondary | ICD-10-CM

## 2018-08-21 DIAGNOSIS — M8580 Other specified disorders of bone density and structure, unspecified site: Secondary | ICD-10-CM

## 2018-08-21 DIAGNOSIS — M810 Age-related osteoporosis without current pathological fracture: Secondary | ICD-10-CM | POA: Diagnosis not present

## 2018-08-21 MED ORDER — KETOCONAZOLE 2 % EX CREA: TOPICAL_CREAM | Freq: Every day | CUTANEOUS | 1 refills | Status: DC | PRN

## 2018-08-21 MED ORDER — TROLAMINE SALICYLATE 10 % EX CREA: 1.0000 "application " | TOPICAL_CREAM | CUTANEOUS | 0 refills | Status: DC | PRN

## 2018-08-21 NOTE — Assessment & Plan Note (Signed)
Patient's pain does not sound cardiac in nature.  Coronary angiogram from 2016 shows clear coronary arteries.  Patient's ability to hear her heartbeat is occasional rather than constant so less likely to be an AVM.  Advised patient that a referral to neurology would likely not be helpful.  Gave a prescription for Aspercreme to rub on the area for relief.

## 2018-08-21 NOTE — Patient Instructions (Signed)
It was nice seeing you today Ms. Doty!  I do not think that your chest pain is due to a damaged nerve, and I think that referring you to neurology would likely be a waste of your time and money.  I am sending you some cream to rub on the area when it hurts to hopefully relieve your pain.  I am also giving you prescription for your hand cream.  I hope this gets better soon.  If you have any questions or concerns, please feel free to call the clinic.   Be well,  Dr. Shan Levans

## 2018-08-21 NOTE — Progress Notes (Signed)
Subjective:    Sheila Jacobs - 67 y.o. female MRN [id number]  Date of birth: 12-22-1950  CC: discuss medications  HPI  Sheila Jacobs is here to discuss her medications.  She would also like to discuss throbbing in her right leg, dry skin on her left palm, and sharp pains in her chest radiating the right side of her neck.  Throbbing in right leg - feels like "veins are popping" - located in her medial and anterior R leg - takes her Eliquis twice daily with good adherence, although she has had concerns that this medication is causing her back pain and the dry skin on her left palm - wears support hose, which helps her  Dry skin on L palm - she did not find cortisone to be helpful, but ketoconazole caused resolution of her symptoms - her dry skin has returned now that she has stopped the ketoconazole - has been going on for 6-7 months  Chest pain - occurs at rest, not during activity - radiates to the right side of her neck and to the right side of her back - feels like "needles are sticking in me" - can hear her heartbeat in her R ear occasionally - thinks she has a damaged nerve  - says that she needs a referral to a neurologist and needs a screening test for this  Anxiety - requests a refill of her Xanax since this helps her go to sleep each night  Health Maintenance:  Health Maintenance Due  Topic Date Due  . DEXA SCAN  11/16/2013    -  reports that she quit smoking about 4 years ago. Her smoking use included cigarettes. She has a 45.00 pack-year smoking history. She has never used smokeless tobacco. - Review of Systems: Per HPI. - Past Medical History: Patient Active Problem List   Diagnosis Date Noted  . Tinea corporis 08/22/2018  . Right leg swelling 08/21/2018  . Pain and swelling of ankle, right 04/24/2018  . OSA (obstructive sleep apnea) 01/23/2018  . Hypothyroidism   . Hypoxia   . Acute saddle pulmonary embolism without acute cor pulmonale ([place])  12/29/2017  . History of breast cancer 10/23/2017  . Post-surgical hypothyroidism 06/27/2017  . Thyroid goiter 02/26/2017  . Personal history of noncompliance with medical treatment, presenting hazards to health 08/27/2016  . Generalized anxiety disorder 08/09/2016  . Abnormal TSH 05/09/2016  . Abnormality of gait 01/31/2016  . Low back pain 01/31/2016  . Breast cancer of upper-outer quadrant of left female breast ([place]) 01/31/2016  . Former heavy cigarette smoker (20-39 per day) 11/08/2015  . Esophageal reflux   . GERD (gastroesophageal reflux disease) 05/03/2015  . Dependent edema 05/03/2015  . Morbid obesity ([place]) 04/29/2015  . Right carotid bruit 10/06/2014  . Atypical chest pain 09/18/2014  . Hypertension 09/18/2014  . Hyperlipidemia 09/18/2014  . Enlarged LA (left atrium)    - Medications: reviewed and updated   Objective:   Physical Exam BP 130/62   Pulse 65   Temp 98.1 F (36.7 C) (Oral)   Ht 5\' 3"  (1.6 m)   Wt 292 lb 6.4 oz (132.6 kg)   SpO2 91%   BMI 51.80 kg/m  Gen: NAD, alert, cooperative with exam, morbidly obese CV: RRR, good S1/S2, no murmur, nontender to palpation Resp: CTABL, no wheezes, non-labored Abd: Mildly tender to palpation, bowel sounds present, no guarding Skin: no rashes, normal turgor  Neuro: no gross deficits. Musculoskeletal: Right leg nontender to palpation, unable  to see varicose veins due to support hose in place, nonpitting edema up to knee, Homans sign negative     Assessment & Plan:   Atypical chest pain Patient's pain does not sound cardiac in nature.  Coronary angiogram from 2016 shows clear coronary arteries.  Patient's ability to hear her heartbeat is occasional rather than constant so less likely to be an AVM.  Advised patient that a referral to neurology would likely not be helpful.  Gave a prescription for Aspercreme to rub on the area for relief.  Right leg swelling Likely due to chronic, dependent edema rather than blood  clot or cellulitis.  Reassuring that patient has been taking her Eliquis as prescribed.  Encouraged patient to continue wearing support hose and to elevate her feet as much as possible.  Unfortunately there are not many options for relief of her dependent edema, although weight loss would be beneficial.  Tinea corporis Unsure what the exact etiology of the patch of scaly, dry skin on patient's left palm is, but since ketoconazole has worked in the past for the patient and her lesion worsen after stopping this cream, we will re-prescribe it.  Recommend reevaluating this lesion to see if topical steroids might be a better choice in the future.  Generalized anxiety disorder Advised patient that Xanax is contraindicated in patients 76 years and age or older, and that I would be happy to discuss other options for better sleep with her at a follow-up visit.    Maia Breslow, M.D. 08/22/2018, 7:34 AM PGY-2, [place]

## 2018-08-22 ENCOUNTER — Encounter: Payer: Self-pay | Admitting: Family Medicine

## 2018-08-22 DIAGNOSIS — B354 Tinea corporis: Secondary | ICD-10-CM | POA: Insufficient documentation

## 2018-08-22 NOTE — Assessment & Plan Note (Signed)
Advised patient that Xanax is contraindicated in patients 69 years and age or older, and that I would be happy to discuss other options for better sleep with her at a follow-up visit.

## 2018-08-22 NOTE — Assessment & Plan Note (Signed)
Unsure what the exact etiology of the patch of scaly, dry skin on patient's left palm is, but since ketoconazole has worked in the past for the patient and her lesion worsen after stopping this cream, we will re-prescribe it.  Recommend reevaluating this lesion to see if topical steroids might be a better choice in the future.

## 2018-08-22 NOTE — Assessment & Plan Note (Signed)
Likely due to chronic, dependent edema rather than blood clot or cellulitis.  Reassuring that patient has been taking her Eliquis as prescribed.  Encouraged patient to continue wearing support hose and to elevate her feet as much as possible.  Unfortunately there are not many options for relief of her dependent edema, although weight loss would be beneficial.

## 2018-09-04 DIAGNOSIS — R269 Unspecified abnormalities of gait and mobility: Secondary | ICD-10-CM | POA: Diagnosis not present

## 2018-09-09 ENCOUNTER — Encounter: Payer: Self-pay | Admitting: Physician Assistant

## 2018-09-09 ENCOUNTER — Ambulatory Visit (INDEPENDENT_AMBULATORY_CARE_PROVIDER_SITE_OTHER): Payer: Medicare Other | Admitting: Physician Assistant

## 2018-09-09 ENCOUNTER — Encounter

## 2018-09-09 VITALS — BP 144/74 | HR 73 | Ht 63.0 in | Wt 289.6 lb

## 2018-09-09 DIAGNOSIS — Z86711 Personal history of pulmonary embolism: Secondary | ICD-10-CM

## 2018-09-09 DIAGNOSIS — H16141 Punctate keratitis, right eye: Secondary | ICD-10-CM | POA: Diagnosis not present

## 2018-09-09 DIAGNOSIS — R0789 Other chest pain: Secondary | ICD-10-CM | POA: Diagnosis not present

## 2018-09-09 DIAGNOSIS — I10 Essential (primary) hypertension: Secondary | ICD-10-CM | POA: Diagnosis not present

## 2018-09-09 DIAGNOSIS — Z7901 Long term (current) use of anticoagulants: Secondary | ICD-10-CM | POA: Diagnosis not present

## 2018-09-09 LAB — BASIC METABOLIC PANEL
BUN/Creatinine Ratio: 13 (ref 12–28)
BUN: 11 mg/dL (ref 8–27)
CO2: 26 mmol/L (ref 20–29)
CREATININE: 0.85 mg/dL (ref 0.57–1.00)
Calcium: 10.1 mg/dL (ref 8.7–10.3)
Chloride: 96 mmol/L (ref 96–106)
GFR calc Af Amer: 81 mL/min/{1.73_m2} (ref >59)
GFR calc non Af Amer: 70 mL/min/{1.73_m2} (ref >59)
Glucose: 100 mg/dL — ABNORMAL HIGH (ref 65–99)
Potassium: 3.7 mmol/L (ref 3.5–5.2)
Sodium: 138 mmol/L (ref 134–144)

## 2018-09-09 NOTE — Progress Notes (Signed)
Cardiology Office Note   Date:  09/09/2018   ID:  Sheila Jacobs, DOB [birth year], MRN [id number]  PCP:  Kathrene Alu, MD Cardiologist:  Minus Breeding, MD 05/08/2018 Sheila Ferries, PA-C 07/22/2018  Chief Complaint  Patient presents with  . Chest Pain    pt c/o chest pain sunday night feeling like needles    History of Present Illness: Sheila Jacobs is a 67 y.o. female with a history of  PE04/21/2019 with lifelong anticoagulation recommended because of RV dysfunction on echo, OSA on CPAP, nl cors at cath 2016, hypothyroid, rash with Xarelto>> Eliquis  11/12 office visit, patient felt she might be having a problem with Eliquis but preferred it to Coumadin, potassium 3.0 and supplement added, breathing at baseline with weight [place] presents for cardiology follow up.   She had CP the other night. She felt sharp needles sticking all over her upper chest and shoulders. She feels this is from the Eliquis. It started w/out exertion. She went to sleep on CPAP and the sx resolved.   She felt something snapped in her side once when she was eating. Sx resolved and have not returned.   She hears her heart beat in her ears at times.   She felt a knocking in her chest once, thought someone was at the door but they weren't.   Says she is more active now, wonders if she still needs to take the blood thinners.  Still feels that the PE was caused by her sitting in front of the couch watching TV too much.  She wants to find out what is causing the sx. Wonders if it is her nerves.    Past Medical History:  Diagnosis Date  . Acute saddle pulmonary embolism without acute cor pulmonale ([place]) 12/29/2017  . Anginal pain ([place])   . Arthritis   . Breast cancer of upper-outer quadrant of left female breast ([place]) 01/31/2016  . Colon polyps   . GERD (gastroesophageal reflux disease)   . History of radiation therapy 10/9-11/20/17   Left Breast 45 Gy in 25 fractions, Left  Breast Boost 6 Gy in 3 fractions.   . Hyperlipidemia   . Hypertension   . Morbid obesity ([place])   . OSA (obstructive sleep apnea) 01/23/2018  . Palpitations 10/16/2016  . Personal history of noncompliance with medical treatment, presenting hazards to health 08/27/2016  . Thyroid goiter     Past Surgical History:  Procedure Laterality Date  . BREAST LUMPECTOMY Left 2017   radiation  . BREAST LUMPECTOMY WITH RADIOACTIVE SEED AND SENTINEL LYMPH NODE BIOPSY Left 04/11/2016   Procedure: BREAST LUMPECTOMY WITH RADIOACTIVE SEED AND SENTINEL LYMPH NODE BIOPSY;  Surgeon: Excell Seltzer, MD;  Location: [facility];  Service: General;  Laterality: Left;  . COLONOSCOPY WITH PROPOFOL N/A 10/20/2015   Procedure: COLONOSCOPY WITH PROPOFOL;  Surgeon: Mauri Pole, MD;  Location: [facility];  Service: Endoscopy;  Laterality: N/A;  . ESOPHAGOGASTRODUODENOSCOPY (EGD) WITH PROPOFOL N/A 10/20/2015   Procedure: ESOPHAGOGASTRODUODENOSCOPY (EGD) WITH PROPOFOL;  Surgeon: Mauri Pole, MD;  Location: [facility];  Service: Endoscopy;  Laterality: N/A;  . IR ANGIOGRAM PULMONARY BILATERAL SELECTIVE  12/29/2017  . IR ANGIOGRAM SELECTIVE EACH ADDITIONAL VESSEL  12/29/2017  . IR ANGIOGRAM SELECTIVE EACH ADDITIONAL VESSEL  12/29/2017  . IR INFUSION THROMBOL ARTERIAL INITIAL (MS)  12/29/2017  . IR INFUSION THROMBOL ARTERIAL INITIAL (MS)  12/29/2017  . IR THROMB F/U EVAL ART/VEN FINAL DAY ([place])  12/30/2017  .  IR US GUIDE VASC ACCESS RIGHT  12/29/2017  . LEFT HEART CATHETERIZATION WITH CORONARY ANGIOGRAM N/A 10/21/2014   Procedure: LEFT HEART CATHETERIZATION WITH CORONARY ANGIOGRAM;  Surgeon: Burnell Blanks, MD;  Location: [facility] C[facility];  Service: Cardiovascular;  Laterality: N/A;  . THYROIDECTOMY N/A 02/26/2017   Procedure: TOTAL THYROIDECTOMY;  Surgeon: Excell Seltzer, MD;  Location: [facility];  Service: General;  Laterality: N/A;  . TUBAL LIGATION      Current Outpatient Medications  Medication Sig Dispense Refill   . anastrozole (ARIMIDEX) 1 MG tablet Take 1 tablet (1 mg total) by mouth daily. 90 tablet 3  . apixaban (ELIQUIS) 5 MG TABS tablet Take 1 tablet (5 mg total) by mouth 2 (two) times daily. 60 tablet 1  . hydrochlorothiazide (HYDRODIURIL) 25 MG tablet Take 1 tablet (25 mg total) by mouth daily. 90 tablet 3  . ketoconazole (NIZORAL) 2 % cream Apply topically daily as needed for irritation. 30 g 1  . levothyroxine (SYNTHROID, LEVOTHROID) 150 MCG tablet Take 1 tablet (150 mcg total) by mouth daily. 30 tablet 3  . pantoprazole (PROTONIX) 40 MG tablet TAKE ONE TABLET BY MOUTH ONCE DAILY 30 tablet 11  . potassium chloride (K-DUR) 10 MEQ tablet Take 1 tablet (10 mEq total) by mouth daily. 90 tablet 3  . trolamine salicylate (ASPER-FLEX) 10 % cream Apply 1 application topically as needed for muscle pain. 85 g 0   No current facility-administered medications for this visit.     Allergies:   Cortisone; Effexor [venlafaxine]; Lexapro [escitalopram]; and Xarelto [rivaroxaban]    Social History:  The patient  reports that she quit smoking about 4 years ago. Her smoking use included cigarettes. She has a 45.00 pack-year smoking history. She has never used smokeless tobacco. She reports that she does not drink alcohol or use drugs.   Family History:  The patient's family history includes Cataracts in her brother and mother; Diabetes in her mother and sister; Hearing loss in her brother and mother; Heart disease in her mother; Hypertension in her mother; Kidney disease in her mother; Liver disease in her mother.  She indicated that her mother is deceased. She indicated that her father is deceased. She indicated that her sister is deceased. She indicated that her brother is alive. She indicated that her maternal grandmother is deceased. She indicated that her maternal grandfather is deceased. She indicated that her paternal grandmother is deceased. She indicated that her paternal grandfather is deceased. She  indicated that the status of her neg hx is unknown.   ROS:  Please see the history of present illness. All other systems are reviewed and negative.    PHYSICAL EXAM: VS:  BP (!) 144/74   Pulse 73   Ht 5\' 3"  (1.6 m)   Wt 289 lb 9.6 oz (131.4 kg)   BMI 51.30 kg/m  , BMI Body mass index is 51.3 kg/m. GEN: Well nourished, well developed, morbidly obese female in no acute distress HEENT: normal for age  Neck: no JVD, no carotid bruit, no masses Cardiac: RRR; no murmur, no rubs, or gallops Respiratory:  clear to auscultation bilaterally, normal work of breathing GI: soft, nontender, nondistended, + BS MS: no deformity or atrophy; no edema; distal pulses are 2+ in all 4 extremities  Skin: warm and dry, no rash Neuro:  Strength and sensation are intact Psych: euthymic mood, full affect   EKG:  EKG is ordered today. The ekg ordered today demonstrates SR, no acute ischemic changes, normal intervals  ECHO:  - Left ventricle: The cavity size was normal. Systolic function was   normal. Wall motion was normal; there were no regional wall   motion abnormalities. There was an increased relative   contribution of atrial contraction to ventricular filling, which   may be due to aging or hypovolemia. Left ventricular diastolic   function parameters were normal. - Aortic arch: The aortic arch had moderate diffuse disease.  Impressions:  - Compared to April 2019, right ventricular size and function has   returned to normal.  Lower extremity Dopplers: 04/24/2018 Final Interpretation: Right: There is no evidence of deep vein thrombosis in the lower extremity. However, portions of this examination were limited- see technologist comments above. No cystic structure found in the popliteal fossa. Left: No evidence of common femoral vein obstruction.   Recent Labs: 12/28/2017: ALT 11; B Natriuretic Peptide 592.6 01/02/2018: Hemoglobin 11.0; Magnesium 2.1; Platelets 129 06/11/2018: TSH  1.16 07/22/2018: BUN 8; Creatinine, Ser 0.79; Potassium 3.0; Sodium 139  CBC    Component Value Date/Time   WBC 3.2 (L) 01/02/2018 2057   RBC 3.42 (L) 01/02/2018 2057   HGB 11.0 (L) 01/02/2018 2057   HGB 12.2 11/25/2017 1026   HGB 11.2 (L) 08/29/2016 0856   HCT 33.4 (L) 01/02/2018 2057   HCT 33.9 (L) 08/29/2016 0856   PLT 129 (L) 01/02/2018 2057   PLT 238 11/25/2017 1026   PLT 153 08/29/2016 0856   MCV 97.7 01/02/2018 2057   MCV 95.0 08/29/2016 0856   MCH 32.2 01/02/2018 2057   MCHC 32.9 01/02/2018 2057   RDW 13.4 01/02/2018 2057   RDW 12.6 08/29/2016 0856   LYMPHSABS 1.1 01/02/2018 2057   LYMPHSABS 1.3 08/29/2016 0856   MONOABS 0.4 01/02/2018 2057   MONOABS 0.3 08/29/2016 0856   EOSABS 0.2 01/02/2018 2057   EOSABS 0.1 08/29/2016 0856   BASOSABS 0.0 01/02/2018 2057   BASOSABS 0.0 08/29/2016 0856   CMP Latest Ref Rng & Units 07/22/2018 02/11/2018 01/02/2018  Glucose 65 - 99 mg/dL 94 112(H) 124(H)  BUN 8 - 27 mg/dL 8 10 5(L)  Creatinine 0.57 - 1.00 mg/dL 0.79 0.77 0.87  Sodium 134 - 144 mmol/L 139 140 140  Potassium 3.5 - 5.2 mmol/L 3.0(L) 3.6 3.8  Chloride 96 - 106 mmol/L 99 104 108  CO2 20 - 29 mmol/L 26 30 24   Calcium 8.7 - 10.3 mg/dL 9.7 10.1 8.9  Total Protein 6.5 - 8.1 g/dL - - -  Total Bilirubin 0.3 - 1.2 mg/dL - - -  Alkaline Phos 38 - 126 U/L - - -  AST 15 - 41 U/L - - -  ALT 14 - 54 U/L - - -     Lipid Panel Lab Results  Component Value Date   CHOL 174 07/04/2018   HDL 71 07/04/2018   LDLCALC 92 07/04/2018   TRIG 55 07/04/2018   CHOLHDL 2.5 07/04/2018      Wt Readings from Last 3 Encounters:  09/09/18 289 lb 9.6 oz (131.4 kg)  08/21/18 292 lb 6.4 oz (132.6 kg)  07/22/18 287 lb (130.2 kg)     Other studies Reviewed: Additional studies/ records that were reviewed today include: Office notes, hospital records and testing.  ASSESSMENT AND PLAN:  1.  History of saddle embolus PE: -Dr. Rosezella [place] previous note states lifelong anticoagulation. -Her  RV has returned to normal by most recent echo. -The patient states that she is more active now and feels that she is not going to have another  PE. -She is to discuss this with Dr. [name] when she sees him next.  For now, continue anticoagulation.  2.  Chronic anticoagulation: -I explained that I had no evidence that any of the symptoms she is having were coming from the Eliquis. -I explained that she could not go back to the Xarelto because it was associated with a rash.  Therefore, her only other option for anticoagulation is Coumadin. -I explained that the Eliquis is easier to manage in many ways than the Coumadin, she is probably better off staying on that.  3.  Chest pain: - I explained that the pins-and-needles feeling in her upper chest and shoulders was not consistent with angina.  I offered a stress test to her if she would like, but explained I did not feel strongly that one was needed. -For now, she is going to continue following up on some nerve testing that she is having done and will call back if she decides to have her chest pain worked up further.  4. HTN:  - BP is good on current therapy, no changes   Current medicines are reviewed at length with the patient today.  The patient has concerns regarding medicines. Concerns were addressed.   The following changes have been made:  no change  Labs/ tests ordered today include:   Orders Placed This Encounter  Procedures  . Basic metabolic panel  . EKG 12-Lead    Disposition:   FU with Minus Breeding, MD  Signed, Sheila Ferries, PA-C  09/09/2018 12:34 PM    [facility] Phone: [phone]; Fax: [phone]

## 2018-09-09 NOTE — Patient Instructions (Signed)
Medication Instructions:  Your physician recommends that you continue on your current medications as directed. Please refer to the Current Medication list given to you today.  If you need a refill on your cardiac medications before your next appointment, please call your pharmacy.   Lab work: Art gallery manager today  If you have labs (blood work) drawn today and your tests are completely normal, you will receive your results only by: Marland Kitchen MyChart Message (if you have MyChart) OR . A paper copy in the mail If you have any lab test that is abnormal or we need to change your treatment, we will call you to review the results.  Testing/Procedures: None ordered  Follow-Up: At [facility], you and your health needs are our priority.  As part of our continuing mission to provide you with exceptional heart care, we have created designated Provider Care Teams.  These Care Teams include your primary Cardiologist (physician) and Advanced Practice Providers (APPs -  Physician Assistants and Nurse Practitioners) who all work together to provide you with the care you need, when you need it. You will need a follow up appointment in 3 months.  Please call our office 2 months in advance to schedule this appointment.  You may see Minus Breeding, MD or one of the following Advanced Practice Providers on your designated Care Team:   Rosaria Ferries, PA-C . Jory Sims, DNP, ANP  Any Other Special Instructions Will Be Listed Below (If Applicable). Call the office if you have a reoccurrence of chest pain  If your have a reoccurrence of " hearing your heart beating in your ear" check your blood pressure it may be elevated. If your BP is consistently elevated contact the office.

## 2018-09-16 ENCOUNTER — Other Ambulatory Visit: Payer: Self-pay | Admitting: Pharmacist

## 2018-09-16 ENCOUNTER — Other Ambulatory Visit: Payer: Self-pay

## 2018-09-16 MED ORDER — APIXABAN 5 MG PO TABS: 5.0000 mg | ORAL_TABLET | Freq: Two times a day (BID) | ORAL | 10 refills | Status: DC

## 2018-09-16 MED ORDER — APIXABAN 5 MG PO TABS: 5.0000 mg | ORAL_TABLET | Freq: Two times a day (BID) | ORAL | 5 refills | Status: DC

## 2018-09-20 DIAGNOSIS — G4733 Obstructive sleep apnea (adult) (pediatric): Secondary | ICD-10-CM | POA: Diagnosis not present

## 2018-09-24 ENCOUNTER — Telehealth: Payer: Self-pay | Admitting: *Deleted

## 2018-09-24 NOTE — Telephone Encounter (Signed)
Pt states that she called last Thursday (no record of this) and never received a callback.  Last Thursday she said that the bottom of her right foot hurt and it was red when she looked at it.  She denies the redness now but states that it still hurts.    Advised that since she is on blood thinners I would like for her to be seen but she cant come in until Friday due to transportation.   Appt made but advised to call 911 if SOB or excruciating pain in foot.  Pt is agreeable.  Dhwani Venkatesh, Salome Spotted, CMA

## 2018-09-25 NOTE — Telephone Encounter (Signed)
Thank you for your help, Janett Billow!

## 2018-09-26 ENCOUNTER — Ambulatory Visit: Payer: Medicare Other | Admitting: Family Medicine

## 2018-09-30 ENCOUNTER — Ambulatory Visit (HOSPITAL_COMMUNITY)
Admission: RE | Admit: 2018-09-30 | Discharge: 2018-09-30 | Disposition: A | Discharge status: Home / Self Care | Payer: Medicare Other | Source: Ambulatory Visit | Attending: Family Medicine | Admitting: Family Medicine

## 2018-09-30 ENCOUNTER — Encounter: Payer: Self-pay | Admitting: Family Medicine

## 2018-09-30 ENCOUNTER — Ambulatory Visit (INDEPENDENT_AMBULATORY_CARE_PROVIDER_SITE_OTHER): Payer: Medicare Other | Admitting: Family Medicine

## 2018-09-30 ENCOUNTER — Other Ambulatory Visit: Payer: Self-pay

## 2018-09-30 VITALS — BP 128/62 | HR 74 | Temp 98.0°F | Ht 63.0 in | Wt 294.6 lb

## 2018-09-30 DIAGNOSIS — M7989 Other specified soft tissue disorders: Secondary | ICD-10-CM

## 2018-09-30 NOTE — Progress Notes (Signed)
Right lower extremity venous duplex exam completed. More details please see preliminary notes on CV PROC under chart review. Result called ordering physician and spoke with Dr. Garlan Fillers.  Arrayah Connors H Elbridge Magowan(RDMS RVT) 09/30/18 2:51 PM

## 2018-09-30 NOTE — Patient Instructions (Signed)
It was great to meet you today! Thank you for letting me participate in your care!  Today, we discussed your right leg pain which is most likely due to an acute exacerbation of your right knee osteoarthritis. I want you to take up to 3 Ibuprofen tablets (200mg  each) 3 times per day as needed for the pain. You can also pick up the cream from the pharmacy and try that if you don't want to take the pills.  I am having you get an ultrasound of your leg to check for a blood clot. If it shows a clot I will call you and tell you.   Be well, Harolyn Rutherford, DO PGY-2, [facility]

## 2018-09-30 NOTE — Progress Notes (Signed)
Subjective: Chief Complaint  Patient presents with  . Rt knee and foot pain     HPI: Sheila Jacobs is a 68 y.o. presenting to clinic today to discuss the following:   Right Leg Pain Patient presents with 2-3 months of progressive worsening right leg pain that goes from the medial side of the knee down the back in her calf to her foot. She has a history of OA right knee and DVT. She is also morbidly obese. She states the pain is a constant, throbbing type pain that is worse with walking, better with rest, and gets worse as the day progresses if she is up and moving. She has tried taking ASA for the pain with no help. She was seen in the clinic recently and prescribed a topical NSAID cream but has not yet filled that prescription. No recent injury, fall, or trauma to the right leg.  She denies any fever, chills, SOB, difficulty breathing, pain on deep inspiration, chest pain, abdominal pain, nausea, or vomiting.  Health Maintenance: none      ROS noted in HPI.   Past Medical, Surgical, Social, and Family History Reviewed & Updated per EMR.   Pertinent Historical Findings include:   Social History   Tobacco Use  Smoking Status Former Smoker  . Packs/day: 1.00  . Years: 45.00  . Pack years: 45.00  . Types: Cigarettes  . Last attempt to quit: 07/11/2014  . Years since quitting: 4.2  Smokeless Tobacco Never Used    Objective: BP 128/62   Pulse 74   Temp 98 F (36.7 C) (Oral)   Ht 5\' 3"  (1.6 m)   Wt 294 lb 9.6 oz (133.6 kg)   SpO2 98%   BMI 52.19 kg/m  Vitals and nursing notes reviewed  Physical Exam Gen: Alert and Oriented x 3, NAD HEENT: Normocephalic, atraumatic, PERRLA, EOMI, TM visible with good light reflex, non-swollen, non-erythematous turbinates, non-erythematous pharyngeal mucosa, no exudates Neck: trachea midline, no thyroidmegaly, no LAD CV: RRR, no murmurs, normal S1, S2 split Resp: CTAB, no wheezing, rales, or rhonchi, comfortable work of  breathing Abd: non-distended, non-tender, soft, +bs in all four quadrants MSK: Right Extremitiy: Obese extremity but no obvious bony deformity or effusion, no erythema or obvious swelling, Medial joint line of the knee is TTP and calf is TTP, limited range of motion in flexion and extension of the right knee, 4/5 strength and gross sensation intact. Right calf measures 62.5cm and left calf measures 59.5cm just below the tibial tuberosity Ext: no clubbing, cyanosis, or edema Neuro: No gross deficits Skin: warm, dry, intact, no rashes  No results found for this or any previous visit (from the past 72 hour(s)).  Assessment/Plan:  Right leg swelling Right knee pain is most likely an acute exacerbation of her right knee OA. No obvious swelling or erythema or warmth making a cellulitis unlikely. DVT is not likely either however given her history of DVT and her obesity makes it difficulty to assess if her leg is swollen or not. Most likely she has swelling due to chronic venous stasis and poor venous return. However, given that I cannot rule out DVT will get U/S doppler today - U/S was difficulty due to body habitus but did not show any acute changes or findings but did show chronic venous changes and stasis. No DVT found. - Use Asper cream as prescribed by Dr. Shan Levans - Given her body habitus she would not be a good candidate for TKR -  Weight loss recommended, could consider injection if pain does not improve   PATIENT EDUCATION PROVIDED: See AVS    Diagnosis and plan along with any newly prescribed medication(s) were discussed in detail with this patient today. The patient verbalized understanding and agreed with the plan. Patient advised if symptoms worsen return to clinic or ER.    No orders of the defined types were placed in this encounter.   No orders of the defined types were placed in this encounter.    Harolyn Rutherford, DO 09/30/2018, 11:31 AM PGY-2 [place]

## 2018-09-30 NOTE — Progress Notes (Signed)
[facility] Voucher:  Previous assistance?  No  Taxi, Bus pass, gas card, food, or other? Taxi Med co-pay? no         Overly?  no Other:  N/A  Taxi voucher faxed to [facility]).  Patient to be picked [facility] from [facility] and transported to [facility] for imaging (2pm appt).  Patient states she will take bus home or have daughter pick her up from [facility].   Provider:  [facility] Staff:  Burna Forts, MSN, RN-BC Note routed to Burna Forts, RN and Casimer Lanius, LCSW to add to [employer].

## 2018-10-02 NOTE — Assessment & Plan Note (Signed)
Right knee pain is most likely an acute exacerbation of her right knee OA. No obvious swelling or erythema or warmth making a cellulitis unlikely. DVT is not likely either however given her history of DVT and her obesity makes it difficulty to assess if her leg is swollen or not. Most likely she has swelling due to chronic venous stasis and poor venous return. However, given that I cannot rule out DVT will get U/S doppler today - U/S was difficulty due to body habitus but did not show any acute changes or findings but did show chronic venous changes and stasis. No DVT found. - Use Asper cream as prescribed by Dr. Shan Levans - Given her body habitus she would not be a good candidate for TKR - Weight loss recommended, could consider injection if pain does not improve

## 2018-10-05 DIAGNOSIS — R269 Unspecified abnormalities of gait and mobility: Secondary | ICD-10-CM | POA: Diagnosis not present

## 2018-10-21 DIAGNOSIS — G4733 Obstructive sleep apnea (adult) (pediatric): Secondary | ICD-10-CM | POA: Diagnosis not present

## 2018-10-21 NOTE — Progress Notes (Signed)
Cardiology Office Note   Date:  10/22/2018   ID:  Sheila Jacobs, DOB Sep 01, 1951, MRN [id number]  PCP:  Kathrene Alu, MD  Cardiologist:   Minus Breeding, MD   Chief Complaint  Patient presents with  . Pulmonary Emboli      History of Present Illness: Sheila Jacobs is a 68 y.o. female who presents for follow up of pulmonary embolism.  She was admitted in April 2019 with this.  She has also been diagnosed with sleep apnea last year.  Since I last saw her she was in the clinic with atypical chest pain and was managed medically.  Today she returns for follow-up.  She complains of her knee hurting her.  She is not complaining of any cardiac issues.  She walks around [place] and does some other household chores.  She complains of pain in the bottom of her feet.  She is not had any palpitations, presyncope or syncope.  She is not had any new shortness of breath, PND or orthopnea.  Said no weight gain or edema.   Past Medical History:  Diagnosis Date  . Acute saddle pulmonary embolism without acute cor pulmonale ([place]) 12/29/2017  . Anginal pain ([place])   . Arthritis   . Breast cancer of upper-outer quadrant of left female breast ([place]) 01/31/2016  . Colon polyps   . GERD (gastroesophageal reflux disease)   . History of radiation therapy 10/9-11/20/17   Left Breast 45 Gy in 25 fractions, Left Breast Boost 6 Gy in 3 fractions.   . Hyperlipidemia   . Hypertension   . Morbid obesity ([place])   . OSA (obstructive sleep apnea) 01/23/2018  . Palpitations 10/16/2016  . Personal history of noncompliance with medical treatment, presenting hazards to health 08/27/2016  . Thyroid goiter     Past Surgical History:  Procedure Laterality Date  . BREAST LUMPECTOMY Left 2017   radiation  . BREAST LUMPECTOMY WITH RADIOACTIVE SEED AND SENTINEL LYMPH NODE BIOPSY Left 04/11/2016   Procedure: BREAST LUMPECTOMY WITH RADIOACTIVE SEED AND SENTINEL LYMPH NODE BIOPSY;  Surgeon: Excell Seltzer, MD;   [facility];  Service: General;  Laterality: Left;  . COLONOSCOPY WITH PROPOFOL N/A 10/20/2015   Procedure: COLONOSCOPY WITH PROPOFOL;  Surgeon: Mauri Pole, MD;  [facility];  Service: Endoscopy;  Laterality: N/A;  . ESOPHAGOGASTRODUODENOSCOPY (EGD) WITH PROPOFOL N/A 10/20/2015   Procedure: ESOPHAGOGASTRODUODENOSCOPY (EGD) WITH PROPOFOL;  Surgeon: Mauri Pole, MD;  [facility];  Service: Endoscopy;  Laterality: N/A;  . IR ANGIOGRAM PULMONARY BILATERAL SELECTIVE  12/29/2017  . IR ANGIOGRAM SELECTIVE EACH ADDITIONAL VESSEL  12/29/2017  . IR ANGIOGRAM SELECTIVE EACH ADDITIONAL VESSEL  12/29/2017  . IR INFUSION THROMBOL ARTERIAL INITIAL (MS)  12/29/2017  . IR INFUSION THROMBOL ARTERIAL INITIAL (MS)  12/29/2017  . IR THROMB F/U EVAL ART/VEN FINAL DAY (MS)  12/30/2017  . IR US GUIDE VASC ACCESS RIGHT  12/29/2017  . LEFT HEART CATHETERIZATION WITH CORONARY ANGIOGRAM N/A 10/21/2014   Procedure: LEFT HEART CATHETERIZATION WITH CORONARY ANGIOGRAM;  Surgeon: Burnell Blanks, MD;  Location: [facility] CATH LAB;  Service: Cardiovascular;  Laterality: N/A;  . THYROIDECTOMY N/A 02/26/2017   Procedure: TOTAL THYROIDECTOMY;  Surgeon: Excell Seltzer, MD;  [facility];  Service: General;  Laterality: N/A;  . TUBAL LIGATION       Current Outpatient Medications  Medication Sig Dispense Refill  . anastrozole (ARIMIDEX) 1 MG tablet Take 1 tablet (1 mg total) by mouth daily. 90 tablet  3  . apixaban (ELIQUIS) 5 MG TABS tablet Take 1 tablet (5 mg total) by mouth 2 (two) times daily. 60 tablet 5  . hydrochlorothiazide (HYDRODIURIL) 25 MG tablet Take 1 tablet (25 mg total) by mouth daily. 90 tablet 3  . ketoconazole (NIZORAL) 2 % cream Apply topically daily as needed for irritation. 30 g 1  . levothyroxine (SYNTHROID, LEVOTHROID) 150 MCG tablet Take 1 tablet (150 mcg total) by mouth daily. 30 tablet 3  . pantoprazole (PROTONIX) 40 MG tablet TAKE ONE TABLET BY MOUTH ONCE DAILY 30  tablet 11  . potassium chloride (K-DUR) 10 MEQ tablet Take 1 tablet (10 mEq total) by mouth daily. 90 tablet 3  . trolamine salicylate (ASPER-FLEX) 10 % cream Apply 1 application topically as needed for muscle pain. 85 g 0   No current facility-administered medications for this visit.     Allergies:   Cortisone; Effexor [venlafaxine]; Lexapro [escitalopram]; and Xarelto [rivaroxaban]     ROS:  Please see the history of present illness.   Otherwise, review of systems are positive for none.   All other systems are reviewed and negative.    PHYSICAL EXAM: VS:  BP 140/70   Pulse 76   Ht 5\' 3"  (1.6 m)   Wt 289 lb (131.1 kg)   BMI 51.19 kg/m  , BMI Body mass index is 51.19 kg/m. GENERAL:  Well appearing HEENT:  Poor dentition   NECK:  No jugular venous distention, waveform within normal limits, carotid upstroke brisk and symmetric, no bruits, no thyromegaly LUNGS:  Clear to auscultation bilaterally CHEST:  Unremarkable HEART:  PMI not displaced or sustained,S1 and S2 within normal limits, no S3, no S4, no clicks, no rubs, no murmurs ABD:  Flat, positive bowel sounds normal in frequency in pitch, no bruits, no rebound, no guarding, no midline pulsatile mass, no hepatomegaly, no splenomegaly EXT:  2 plus pulses throughout, no edema, no cyanosis no clubbing   EKG:  EKG is not ordered today. The ekg ordered 09/11/18 demonstrates sinus rhythm, rate 73, axis within normal limits, intervals within normal limits, resolution of the anterior T wave inversion   Recent Labs: 12/28/2017: ALT 11; B Natriuretic Peptide 592.6 01/02/2018: Hemoglobin 11.0; Magnesium 2.1; Platelets 129 06/11/2018: TSH 1.16 09/09/2018: BUN 11; Creatinine, Ser 0.85; Potassium 3.7; Sodium 138    Lipid Panel    Component Value Date/Time   CHOL 174 07/04/2018 1042   TRIG 55 07/04/2018 1042   HDL 71 07/04/2018 1042   CHOLHDL 2.5 07/04/2018 1042   CHOLHDL 2.6 08/09/2016 1016   VLDL 8 08/09/2016 1016   LDLCALC 92  07/04/2018 1042      Wt Readings from Last 3 Encounters:  10/22/18 289 lb (131.1 kg)  09/30/18 294 lb 9.6 oz (133.6 kg)  09/09/18 289 lb 9.6 oz (131.4 kg)      Other studies Reviewed: Additional studies/ records that were reviewed today include: Labs Review of the above records demonstrates:    ASSESSMENT AND PLAN:  BILATERAL PE:     The patient is being managed as if she has an unprovoked pulmonary embolism.  She will continue on the meds as listed.  No change in therapy.   I will check a CBC.  KNEE/FOOT PAIN: She has excellent pulses.  I would not suggest a cardiac etiology.  SLEEP APNEA:   She is followed by Dr. Radford Pax.  She is going to see her again in August.   MORBID OBESITY:      She needs  to lose weight and we have discussed this.   HTN: The blood pressure is at target.  She needs weight loss for even better control. .  TOBACCO ABUSE:  Quit 4 years ago.     Current medicines are reviewed at length with the patient today.  The patient does not have concerns regarding medicines.  The following changes have been made: None Labs/ tests ordered today include: None  Orders Placed This Encounter  Procedures  . CBC     Disposition:   FU with  APP in 12 months.    Signed, Minus Breeding, MD  10/22/2018 10:43 AM

## 2018-10-22 ENCOUNTER — Encounter: Payer: Self-pay | Admitting: Cardiology

## 2018-10-22 ENCOUNTER — Ambulatory Visit (INDEPENDENT_AMBULATORY_CARE_PROVIDER_SITE_OTHER): Payer: Medicare Other | Admitting: Cardiology

## 2018-10-22 VITALS — BP 140/70 | HR 76 | Ht 63.0 in | Wt 289.0 lb

## 2018-10-22 DIAGNOSIS — Z7901 Long term (current) use of anticoagulants: Secondary | ICD-10-CM | POA: Insufficient documentation

## 2018-10-22 DIAGNOSIS — G4730 Sleep apnea, unspecified: Secondary | ICD-10-CM | POA: Diagnosis not present

## 2018-10-22 DIAGNOSIS — Z86711 Personal history of pulmonary embolism: Secondary | ICD-10-CM | POA: Diagnosis not present

## 2018-10-22 DIAGNOSIS — Z79899 Other long term (current) drug therapy: Secondary | ICD-10-CM | POA: Insufficient documentation

## 2018-10-22 DIAGNOSIS — I10 Essential (primary) hypertension: Secondary | ICD-10-CM

## 2018-10-22 LAB — CBC
Hematocrit: 35.2 % (ref 34.0–46.6)
Hemoglobin: 12.4 g/dL (ref 11.1–15.9)
MCH: 32.7 pg (ref 26.6–33.0)
MCHC: 35.2 g/dL (ref 31.5–35.7)
MCV: 93 fL (ref 79–97)
Platelets: 227 10*3/uL (ref 150–450)
RBC: 3.79 x10E6/uL (ref 3.77–5.28)
RDW: 12.2 % (ref 11.7–15.4)
WBC: 3.6 10*3/uL (ref 3.4–10.8)

## 2018-10-22 NOTE — Patient Instructions (Signed)
Medication Instructions:  Continue current medications  If you need a refill on your cardiac medications before your next appointment, please call your pharmacy.  Labwork: CBC today  Take the provided lab slips with you to the lab for your blood draw.   When you have your labs (blood work) drawn today and your tests are completely normal, you will receive your results only by MyChart Message (if you have MyChart) -OR-  A paper copy in the mail.  If you have any lab test that is abnormal or we need to change your treatment, we will call you to review these results.  Testing/Procedures: None Ordered  Follow-Up: You will need a follow up appointment in 1 Year.  Please call our office 2 months in advance to schedule this appointment.  You may see one of the following Advanced Practice Providers on your designated Care Team:   Rosaria Ferries, PA-C   At [facility], you and your health needs are our priority.  As part of our continuing mission to provide you with exceptional heart care, we have created designated Provider Care Teams.  These Care Teams include your primary Cardiologist (physician) and Advanced Practice Providers (APPs -  Physician Assistants and Nurse Practitioners) who all work together to provide you with the care you need, when you need it.   Thank you for choosing [facility] at [facility]!!

## 2018-10-29 ENCOUNTER — Other Ambulatory Visit: Payer: Self-pay | Admitting: Endocrinology

## 2018-10-30 ENCOUNTER — Telehealth: Payer: Self-pay | Admitting: Endocrinology

## 2018-10-30 ENCOUNTER — Other Ambulatory Visit: Payer: Self-pay

## 2018-10-30 MED ORDER — LEVOTHYROXINE SODIUM 150 MCG PO TABS: 150.0000 ug | ORAL_TABLET | Freq: Every day | ORAL | 0 refills | Status: DC

## 2018-10-30 NOTE — Telephone Encounter (Signed)
error 

## 2018-10-31 ENCOUNTER — Other Ambulatory Visit: Payer: Self-pay

## 2018-11-03 MED ORDER — PANTOPRAZOLE SODIUM 40 MG PO TBEC: 40.0000 mg | DELAYED_RELEASE_TABLET | Freq: Every day | ORAL | 11 refills | Status: DC

## 2018-11-21 ENCOUNTER — Other Ambulatory Visit: Payer: Self-pay | Admitting: Physician Assistant

## 2018-11-21 NOTE — Telephone Encounter (Signed)
New Message          *STAT* If patient is at the pharmacy, call can be transferred to refill team.   1. Which medications need to be refilled? (please list name of each medication and dose if known)  Eliquis  2. Which pharmacy/location (including street and city if local pharmacy) is medication to be sent to? [facility] on [address]  3. Do they need a 30 day or 90 day supply? [place]

## 2018-11-24 ENCOUNTER — Ambulatory Visit: Payer: Medicare Other | Admitting: Hematology and Oncology

## 2018-11-24 MED ORDER — APIXABAN 5 MG PO TABS: 5.0000 mg | ORAL_TABLET | Freq: Two times a day (BID) | ORAL | 2 refills | Status: DC

## 2018-11-24 NOTE — Telephone Encounter (Signed)
Pt is a 68 yr old female who last saw Dr [name] on 10/22/18, weight at that visit was 131.1Kg. Last SCr was 0.85 on 09/09/18. Will refill Eliquis 5mg  BID.

## 2018-12-01 ENCOUNTER — Ambulatory Visit: Payer: Medicare Other | Admitting: Hematology and Oncology

## 2018-12-03 ENCOUNTER — Other Ambulatory Visit: Payer: Self-pay | Admitting: Family Medicine

## 2018-12-05 ENCOUNTER — Telehealth: Payer: Self-pay | Admitting: Cardiology

## 2018-12-05 NOTE — Telephone Encounter (Signed)
New Message  Patient states she eats a lot of pickles and vinegar and wants to know if that's ok.

## 2018-12-05 NOTE — Telephone Encounter (Signed)
Returned call to pt informed pt that she should stay away from sodium abd these foods. She states that she would like a low sodium diet mailed to her. I will put in the mail today, she will refrain from these foods

## 2018-12-09 ENCOUNTER — Other Ambulatory Visit: Payer: Medicare Other

## 2018-12-16 ENCOUNTER — Ambulatory Visit: Payer: Medicare Other | Admitting: Endocrinology

## 2018-12-29 NOTE — Assessment & Plan Note (Signed)
Left lumpectomy 04/11/2016: IDC grade 3, 1.2 cm, IG DCIS, LVI present, 0/8 lymph nodes negative, ER 100%, PR 90%, HER-2 negative, Ki-67 10%, T1 cN0 stage I a pathologic stage  Adj XRT 06/18/16 to 07/30/16  Treatment Plan: Adj Anti-estrogen therapy with Letrozole 2.5 mg daily started 08/29/2016  Letrozole toxicities: Denies any hot flashes. Patient is significantly obese and has not been exercising or cutting down her food intake. I believe this is the reason why she hurts all over and has difficulty with ambulation. She blames her thyroid medication for the symptoms.  I do not believe it is her thyroid medicine at all.  Breast cancer surveillance: 1.  Breast exam 11/25/2017: Benign  2. mammogram 06/11/2018: Density category B, no evidence of breast cancer  Return to clinic in 1 year for follow-up

## 2018-12-31 ENCOUNTER — Telehealth: Payer: Self-pay | Admitting: Cardiology

## 2018-12-31 NOTE — Telephone Encounter (Signed)
Per pt call she would like to do the stress test that was discussed at her last appointment.  Pt is coplainig of getting up every morning with a chest, back and stomach pain it last for about 10 min.  Pt would like a call back please if any questions and to let her know the plan going forward.

## 2018-12-31 NOTE — Telephone Encounter (Signed)
Spoke with patient and she has been having sharp chest pain since last week. They last for about 10 minutes. Pain runs up shoulders and into neck bilaterally. Patient describes pains a needles sticking her. This morning when she had them she drank water real fast and took deep breath, this relieved pain. She has done this and in past and does help. Patient denies any shortness of breath or other symptoms. She has no way of checking her blood pressure at home.  Patient wants a stress test. Advised patient will forward to Dr [name] for review

## 2019-01-01 ENCOUNTER — Telehealth: Payer: Self-pay | Admitting: Hematology and Oncology

## 2019-01-01 NOTE — Telephone Encounter (Signed)
Calling regarding upcoming Webex appointment, patient does not have access for Webex and this needs to be a telephone visit.

## 2019-01-03 NOTE — Telephone Encounter (Signed)
She would need a telehealth visit to discuss symptoms.

## 2019-01-05 NOTE — Progress Notes (Signed)
HEMATOLOGY-ONCOLOGY TELEPHONE VISIT PROGRESS NOTE  I connected with Sheila Jacobs on 01/06/2019 at 11:30 AM EDT by telephone and verified that I am speaking with the correct person using two identifiers.  I discussed the limitations, risks, security and privacy concerns of performing an evaluation and management service by telephone and the availability of in person appointments.  I also discussed with the patient that there may be a patient responsible charge related to this service. The patient expressed understanding and agreed to proceed.   History of Present Illness: Sheila Jacobs is a 68 y.o. female with above-mentioned history of left breast cancer treated with lumpectomy, radiation, and is currently on anastrozole therapy. I last saw her a year ago. Her most recent mammogram on 06/11/18 showed no evidence of malignancy bilaterally. She presents today over the phone for annual follow-up.     Breast cancer of upper-outer quadrant of left female breast ([place])   01/27/2016 Initial Diagnosis    Screening mammogram: Left breast mass 9 mm, grade 1-2 IDC with DCIS, ER 100%, PR 90%, Ki-67 10%, HER-2 negative ratio 1.5, T1 BN 0 stage IA clinical stage    04/11/2016 Surgery    Left lumpectomy ([place]): IDC grade 3, 1.2 cm, IG DCIS, LVI present, 0/8 lymph nodes negative, ER 100%, PR 90%, HER-2 negative, Ki-67 10%, T1 cN0 stage I a pathologic stage    06/18/2016 - 07/30/2016 Radiation Therapy    Adj XRT Isidore Moos): 1) Left Breast / 45 Gy in 25 fractions.  2) Left Breast Boost /  6 Gy in 3 fractions    08/29/2016 -  Anti-estrogen oral therapy    Anastrozole daily     Observations/Objective: Patient has not cut down her eating and has not lost any significant weight.  She reports that sometimes the medicine causes her to be slightly lightheaded.    Assessment Plan:  Breast cancer of upper-outer quadrant of left female breast ([place]) Left lumpectomy 04/11/2016: IDC grade 3, 1.2 cm, IG DCIS,  LVI present, 0/8 lymph nodes negative, ER 100%, PR 90%, HER-2 negative, Ki-67 10%, T1 cN0 stage I a pathologic stage  Adj XRT 06/18/16 to 07/30/16  Treatment Plan: Adj Anti-estrogen therapy with Letrozole 2.5 mg daily started 08/29/2016  Letrozole toxicities: Denies any hot flashes. Occasional lightheadedness  Patient is significantly obese and has not been exercising or cutting down her food intake. I believe this is the reason why she hurts all over and has difficulty with ambulation. I once again reiterated the fact that she needs to be more active and cut down her food intake.  Breast cancer surveillance: 1.  Breast exam 11/25/2017: Benign  2. mammogram   06/11/2018: Density category B, no evidence of breast cancer  Return to clinic in 1 year for follow-up   I discussed the assessment and treatment plan with the patient. The patient was provided an opportunity to ask questions and all were answered. The patient agreed with the plan and demonstrated an understanding of the instructions. The patient was advised to call back or seek an in-person evaluation if the symptoms worsen or if the condition fails to improve as anticipated.   I provided 15 minutes of non-face-to-face time during this encounter.   Rulon Eisenmenger, MD 01/06/2019     I, Molly Dorshimer, am acting as scribe for Nicholas Lose, MD.  I have reviewed the above documentation for accuracy and completeness, and I agree with the above. [place] this is Dr. Gwynne Edinger cr

## 2019-01-06 ENCOUNTER — Other Ambulatory Visit: Payer: Medicare Other

## 2019-01-06 ENCOUNTER — Inpatient Hospital Stay: Payer: Medicare Other | Attending: Hematology and Oncology | Admitting: Hematology and Oncology

## 2019-01-06 DIAGNOSIS — Z79811 Long term (current) use of aromatase inhibitors: Secondary | ICD-10-CM

## 2019-01-06 DIAGNOSIS — C50412 Malignant neoplasm of upper-outer quadrant of left female breast: Secondary | ICD-10-CM | POA: Diagnosis not present

## 2019-01-06 DIAGNOSIS — Z923 Personal history of irradiation: Secondary | ICD-10-CM

## 2019-01-06 DIAGNOSIS — Z170 Estrogen receptor positive status [ER+]: Secondary | ICD-10-CM | POA: Diagnosis not present

## 2019-01-06 MED ORDER — ANASTROZOLE 1 MG PO TABS: 1.0000 mg | ORAL_TABLET | Freq: Every day | ORAL | 3 refills | Status: DC

## 2019-01-06 NOTE — Telephone Encounter (Signed)
Leave message for pt to call back 

## 2019-01-08 ENCOUNTER — Other Ambulatory Visit (INDEPENDENT_AMBULATORY_CARE_PROVIDER_SITE_OTHER): Payer: Medicare Other

## 2019-01-08 ENCOUNTER — Other Ambulatory Visit: Payer: Self-pay

## 2019-01-08 DIAGNOSIS — E890 Postprocedural hypothyroidism: Secondary | ICD-10-CM

## 2019-01-08 LAB — T4, FREE: Free T4: 1.48 ng/dL (ref 0.60–1.60)

## 2019-01-08 LAB — TSH: TSH: 0.22 u[IU]/mL — ABNORMAL LOW (ref 0.35–4.50)

## 2019-01-08 NOTE — Telephone Encounter (Signed)
Leave message for pt to call back 

## 2019-01-12 NOTE — Progress Notes (Signed)
Patient ID: Sheila Jacobs, female   DOB: March 09, 1951, 68 y.o.   MRN: [id number]           Reason for Appointment: HYPOTHYROIDISM, follow-up   Today's office visit was provided via telemedicine using phone call Explained to the patient and the the limitations of evaluation and management by telemedicine and the availability of in person appointments.  The patient understood the limitations and agreed to proceed. Patient also understood that the telehealth visit is billable. . Location of the patient: Home . 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, mostly right sided  Because of her local pressure symptoms with choking feeling, coughing she was referred for thyroidectomy which was done in June 2018 This had relieved her local pressure symptoms  RECENT history:  She has been consistently on 150 mcg of levothyroxine supplementation for her postsurgical hypothyroidism  She had an increase in her dose in 06/2017 and subsequently increased further up to 150 mcg daily in 08/2017  Previously had been concerned that her thyroid supplement was causing side effects which were varied in nature but now is not complaining as much She still has issues with nonspecific upper chest pain as before She does think that occasionally she feels her heart little irregular but no unusual fatigue She is reporting that she is losing some weight No shakiness Not complaining of cold intolerance or heat intolerance  She is here for her 32-month visit   TSH is now slightly below normal, previously 1.2  Wt Readings from Last 3 Encounters:  10/22/18 289 lb (131.1 kg)  09/30/18 294 lb 9.6 oz (133.6 kg)  09/09/18 289 lb 9.6 oz (131.4 kg)      Lab Results  Component Value Date   FREET4 1.48 01/08/2019   FREET4 1.20 06/11/2018   FREET4 1.13 02/11/2018   TSH 0.22 (L) 01/08/2019   TSH 1.16 06/11/2018   TSH 3.14 02/11/2018       Allergies as of 01/13/2019      Reactions   Cortisone Rash   Effexor [venlafaxine] Palpitations   Lexapro [escitalopram] Palpitations   Heart racing   Xarelto [rivaroxaban] Rash      Medication List       Accurate as of Jan 12, 2019  8:51 PM. Always use your most recent med list.        anastrozole 1 MG tablet Commonly known as:  ARIMIDEX Take 1 tablet (1 mg total) by mouth daily.   apixaban 5 MG Tabs tablet Commonly known as:  ELIQUIS Take 1 tablet (5 mg total) by mouth 2 (two) times daily.   hydrochlorothiazide 25 MG tablet Commonly known as:  HYDRODIURIL Take 1 tablet (25 mg total) by mouth daily.   ketoconazole 2 % cream Commonly known as:  NIZORAL APPLY TOPICALLY EVERY DAY AS NEEDED FOR IRRITATION   levothyroxine 150 MCG tablet Commonly known as:  SYNTHROID Take 1 tablet (150 mcg total) by mouth daily.   pantoprazole 40 MG tablet Commonly known as:  PROTONIX Take 1 tablet (40 mg total) by mouth daily.   potassium chloride 10 MEQ tablet Commonly known as:  K-DUR Take 1 tablet (10 mEq total) by mouth daily.   trolamine salicylate 10 % cream Commonly known as:  [place]per-Flex Apply 1 application topically as needed for muscle pain.       Allergies:  Allergies  Allergen Reactions  . Cortisone Rash  . Effexor [Venlafaxine] Palpitations  . Lexapro [Escitalopram] Palpitations    Heart racing  . Xarelto [Rivaroxaban] Rash    Past Medical History:  Diagnosis Date  . Acute saddle pulmonary embolism without acute cor pulmonale ([place]) 12/29/2017  . Anginal pain ([place])   .  Arthritis   . Breast cancer of upper-outer quadrant of left female breast ([place]) 01/31/2016  . Colon polyps   . GERD (gastroesophageal reflux disease)   . History of radiation therapy 10/9-11/20/17   Left Breast 45 Gy in 25 fractions, Left Breast Boost 6 Gy in 3 fractions.   . Hyperlipidemia   . Hypertension   . Morbid obesity ([place])   . OSA (obstructive sleep apnea) 01/23/2018  . Palpitations 10/16/2016  . Personal history of noncompliance with medical treatment, presenting hazards to health 08/27/2016  . Thyroid goiter     Past Surgical History:  Procedure Laterality Date  . BREAST LUMPECTOMY Left 2017   radiation  . BREAST LUMPECTOMY WITH RADIOACTIVE SEED AND SENTINEL LYMPH NODE BIOPSY Left 04/11/2016   Procedure: BREAST LUMPECTOMY WITH RADIOACTIVE SEED AND SENTINEL LYMPH NODE BIOPSY;  Surgeon: Excell Seltzer, MD;  Location: [place];  Service: General;  Laterality: Left;  . COLONOSCOPY WITH PROPOFOL N/A 10/20/2015   Procedure: COLONOSCOPY WITH PROPOFOL;  Surgeon: Mauri Pole, MD;  [facility];  Service: Endoscopy;  Laterality: N/A;  . ESOPHAGOGASTRODUODENOSCOPY (EGD) WITH PROPOFOL N/A 10/20/2015   Procedure: ESOPHAGOGASTRODUODENOSCOPY (EGD) WITH PROPOFOL;  Surgeon: Mauri Pole, MD;  [facility];  Service: Endoscopy;  Laterality: N/A;  . IR ANGIOGRAM PULMONARY BILATERAL SELECTIVE  12/29/2017  . IR ANGIOGRAM SELECTIVE EACH ADDITIONAL VESSEL  12/29/2017  . IR ANGIOGRAM SELECTIVE EACH ADDITIONAL VESSEL  12/29/2017  . IR INFUSION THROMBOL ARTERIAL INITIAL (MS)  12/29/2017  . IR INFUSION THROMBOL ARTERIAL INITIAL (MS)  12/29/2017  . IR THROMB F/U EVAL ART/VEN FINAL DAY ([place])  12/30/2017  . IR US GUIDE VASC ACCESS RIGHT  12/29/2017  . LEFT HEART CATHETERIZATION WITH CORONARY ANGIOGRAM N/A 10/21/2014   Procedure: LEFT HEART CATHETERIZATION WITH CORONARY ANGIOGRAM;  Surgeon: Burnell Blanks, MD;  Location[facility] CATH LAB;  Service: Cardiovascular;  Laterality: N/A;  .  THYROIDECTOMY N/A 02/26/2017   Procedure: TOTAL THYROIDECTOMY;  Surgeon: Excell Seltzer, MD;  Location[facility];  Service: General;  Laterality: N/A;  . TUBAL LIGATION      Family History  Problem Relation Age of Onset  . Hypertension Mother   . Heart disease Mother        Pacemaker  . Kidney disease Mother   . Cataracts Mother   . Hearing loss Mother   . Diabetes Mother   . Liver disease Mother   . Diabetes Sister   . Cataracts Brother   . Hearing loss Brother   . Colon cancer Neg Hx   . Thyroid disease Neg Hx     Social History:  reports that she quit smoking about 4 years ago. Her smoking use included cigarettes. She has a 45.00 pack-year smoking history. She has never used smokeless tobacco. She reports that she does not  drink alcohol or use drugs.    Review of Systems     Hypertension has been managed by PCP  Has not had monitoring at the drugstore lately  BP Readings from Last 3 Encounters:  10/22/18 140/70  09/30/18 128/62  09/09/18 (!) 144/74      Examination:   There were no vitals taken for this visit.            Assessment/Plan:  Postsurgical hypothyroidism  She is on supplementation with levothyroxine 150 mcg as before  She has been quite regular with taking her supplement and has not complained about this causing side effects which she had believed in the past She does have some nonspecific chest discomfort and mild palpitations but patient was not examined today She thinks she has lost some weight but not clear how much  TSH is slightly below normal, may be related to weight loss  She will now reduce her dose to 137 mcg instead of 150 and explained the reason for the change  HYPERTENSION: She will continue follow-up with her PCP  Follow-up in 2 months with repeat thyroid functions  Elayne Snare 01/12/2019  Duration of telephone encounter =5 minutes

## 2019-01-13 ENCOUNTER — Encounter: Payer: Self-pay | Admitting: Endocrinology

## 2019-01-13 ENCOUNTER — Other Ambulatory Visit: Payer: Self-pay

## 2019-01-13 ENCOUNTER — Telehealth: Payer: Self-pay

## 2019-01-13 ENCOUNTER — Ambulatory Visit (INDEPENDENT_AMBULATORY_CARE_PROVIDER_SITE_OTHER): Payer: Medicare Other | Admitting: Endocrinology

## 2019-01-13 DIAGNOSIS — E890 Postprocedural hypothyroidism: Secondary | ICD-10-CM | POA: Diagnosis not present

## 2019-01-13 MED ORDER — LEVOTHYROXINE SODIUM 137 MCG PO TABS: 137.0000 ug | ORAL_TABLET | Freq: Every day | ORAL | 3 refills | Status: DC

## 2019-01-13 NOTE — Telephone Encounter (Signed)
Patient calling to see if new prescription for lower dose of levothyroxine has been sent in

## 2019-01-13 NOTE — Telephone Encounter (Signed)
This Rx has been sent

## 2019-01-25 ENCOUNTER — Other Ambulatory Visit: Payer: Self-pay | Admitting: Endocrinology

## 2019-01-29 NOTE — Telephone Encounter (Addendum)
Patient called stating that she believes it was her thyroid medication that was cause the chest pain.  She said her chemo doctor lower her medication dosage. She has not been having any chest pain lately.

## 2019-02-04 ENCOUNTER — Other Ambulatory Visit: Payer: Self-pay | Admitting: Physician Assistant

## 2019-02-05 NOTE — Telephone Encounter (Signed)
This is Dr. Hochrein's pt. °

## 2019-02-18 ENCOUNTER — Telehealth: Payer: Self-pay | Admitting: Endocrinology

## 2019-02-18 NOTE — Telephone Encounter (Signed)
She was supposed to follow-up in July.  Please have her come in for labs and office visit and we can discuss the change at that time

## 2019-02-18 NOTE — Telephone Encounter (Signed)
Patient called re: Patient is having problems with throat (painful) since taking the Levothyroxine 137 mcg. Patient states  Before the dosage on Levothyroxine was 150 mcg and patient requests to go back to the 150 mcg dosage. Please call patient at ph# [phone] to advise.

## 2019-02-19 NOTE — Telephone Encounter (Signed)
Could you please call this pt and schedule her for labs and f/u?

## 2019-02-22 NOTE — Telephone Encounter (Signed)
Appointment for July still pending

## 2019-02-24 NOTE — Telephone Encounter (Signed)
Yes that is what was in my original message

## 2019-02-26 ENCOUNTER — Telehealth: Payer: Self-pay | Admitting: Endocrinology

## 2019-02-26 NOTE — Telephone Encounter (Signed)
Yes

## 2019-02-26 NOTE — Telephone Encounter (Signed)
LMTCB and schedule a lab appointment and Dr Dwyane Dee appointment in July 2020 per Dr Ronnie Derby request

## 2019-03-02 ENCOUNTER — Other Ambulatory Visit: Payer: Self-pay | Admitting: Family Medicine

## 2019-03-05 DIAGNOSIS — H9113 Presbycusis, bilateral: Secondary | ICD-10-CM | POA: Insufficient documentation

## 2019-03-05 DIAGNOSIS — M26623 Arthralgia of bilateral temporomandibular joint: Secondary | ICD-10-CM | POA: Insufficient documentation

## 2019-03-07 ENCOUNTER — Other Ambulatory Visit: Payer: Self-pay | Admitting: Endocrinology

## 2019-03-11 ENCOUNTER — Other Ambulatory Visit: Payer: Self-pay

## 2019-03-11 MED ORDER — LEVOTHYROXINE SODIUM 137 MCG PO TABS: 137.0000 ug | ORAL_TABLET | Freq: Every day | ORAL | 3 refills | Status: DC

## 2019-03-31 ENCOUNTER — Other Ambulatory Visit: Payer: Self-pay | Admitting: *Deleted

## 2019-03-31 NOTE — Patient Outreach (Signed)
Referral received [employer] high risk list, outreach call to pt for screening, no answer to telephone and no option to leave voicemail,  RN CM mailed unsuccessful outreach letter to pt home.  PLAN Outreach pt in 3-4 business days  Jacqlyn Larsen [facility], [place] [phone]

## 2019-04-06 ENCOUNTER — Other Ambulatory Visit: Payer: Self-pay | Admitting: *Deleted

## 2019-04-06 ENCOUNTER — Encounter: Payer: Self-pay | Admitting: *Deleted

## 2019-04-06 NOTE — Patient Outreach (Signed)
Referral received from [employer] high risk list, outreach call to pt for screening, pt reports she lives alone but spends a lot of time at her daughter's house, pt still drives and states she is mostly independent in every way.  Pt states she has all medications and takes as prescribed, pt states she is in process of getting a dental appointment as she needs to have teeth pulled and needs dentures, pt has been reluctant with Covid-19 and is on blood thinner which she states she will need to work out with her doctor about going off of if she has teeth pulled.  Pt states she is following precautions for Covid-19 and "could use some more masks"  Pt also in process of sending back a BP cuff she got from [employer] "that doesn't work and my daughter looked at it and somethings just wrong with it"  Pt states when she gets the new cuff she plans to start checking BP.  RN CM talked with pt about CHF diagnosis as per chart review and pt states " I don't have that"  Pt not willing to discuss anything further about CHF citing she has never been told she has this diagnosis. RN CM reviewed medications with pt. RN CM faxed today's note and barrier letter to primary MD.  Outpatient Encounter Medications as of 04/06/2019  Medication Sig  . anastrozole (ARIMIDEX) 1 MG tablet Take 1 tablet (1 mg total) by mouth daily.  Marland Kitchen apixaban (ELIQUIS) 5 MG TABS tablet Take 1 tablet (5 mg total) by mouth 2 (two) times daily.  . hydrochlorothiazide (HYDRODIURIL) 25 MG tablet Take 1 tablet (25 mg total) by mouth daily.  Marland Kitchen ketoconazole (NIZORAL) 2 % cream APPLY TOPICALLY EVERY DAY AS NEEDED FOR IRRITATION  . levothyroxine (SYNTHROID) 137 MCG tablet Take 1 tablet (137 mcg total) by mouth daily before breakfast.  . pantoprazole (PROTONIX) 40 MG tablet Take 1 tablet (40 mg total) by mouth daily.  . potassium chloride (K-DUR) 10 MEQ tablet TAKE 3 TABLETS BY MOUTH THE FIRST 2 DAYS, THEN 1 TABKET ONCE A DAY  . trolamine salicylate  (ASPER-FLEX) 10 % cream Apply 1 application topically as needed for muscle pain.   No facility-administered encounter medications on file as of 04/06/2019.     THN CM Care Plan Problem One     Most Recent Value  Care Plan Problem One  Knowledge deficit related to hypertension  Role Documenting the Problem One  Care Management Coordinator  Care Plan for Problem One  Active  THN Long Term Goal   Pt will verbalize improved self care related to overall health/ hypertension within 60 days  THN Long Term Goal Start Date  04/06/19  Interventions for Problem One Long Term Goal  RN CM established plan of care with pt, mailed successful outreach letter to pt home with consent form, pamphlet, 24 hour nurse line magnet, low sodium poster and 3 face masks  THN CM Short Term Goal #1   Pt will verbalize low sodium diet and choose foods low in sodium within 30 days  THN CM Short Term Goal #1 Start Date  04/06/19  Interventions for Short Term Goal #1  RN CM mailed low sodium poster to pt home, reviewed foods lower in sodium to choose, educated about the effects of higher sodium foods on BP  THN CM Short Term Goal #2   Pt will have BP cuff and start monitoring BP several times weekly within 30 days  THN CM Short  Term Goal #2 Start Date  04/06/19  Interventions for Short Term Goal #2  Pt states she has new BP cuff from [employer] and it does not work correctly, she is going to contact them and mail the cuff back to them and request a new one, pt states she is able to contact them herself and get this completed,  Rn CM ask pt to start monitoring  BP at least several times weekly when she gets the new cuff, ask pt to contact [employer] this week.      PLAN Outreach pt next month for telephone assessment Ask if pt checking BP Reinforce low sodium diet  Jacqlyn Larsen [facility], BSN [place] Coordinator [phone]

## 2019-04-14 ENCOUNTER — Ambulatory Visit
Admission: RE | Admit: 2019-04-14 | Discharge: 2019-04-14 | Disposition: A | Discharge status: Home / Self Care | Payer: Medicare Other | Source: Ambulatory Visit | Attending: Family | Admitting: Family

## 2019-04-14 ENCOUNTER — Other Ambulatory Visit: Payer: Self-pay | Admitting: Family Medicine

## 2019-04-14 ENCOUNTER — Other Ambulatory Visit (INDEPENDENT_AMBULATORY_CARE_PROVIDER_SITE_OTHER): Payer: Medicare Other

## 2019-04-14 ENCOUNTER — Other Ambulatory Visit: Payer: Self-pay

## 2019-04-14 DIAGNOSIS — E890 Postprocedural hypothyroidism: Secondary | ICD-10-CM | POA: Diagnosis not present

## 2019-04-14 DIAGNOSIS — M25561 Pain in right knee: Secondary | ICD-10-CM

## 2019-04-14 LAB — TSH: TSH: 0.84 u[IU]/mL (ref 0.35–4.50)

## 2019-04-14 LAB — T4, FREE: Free T4: 1.43 ng/dL (ref 0.60–1.60)

## 2019-04-14 IMAGING — CR RIGHT KNEE - COMPLETE 4+ VIEW
4 series · 4 of 4 positions shown · non-contrast
Comparison: [DATE]

CLINICAL DATA: Right knee pain

EXAM:
RIGHT KNEE - COMPLETE 4+ VIEW

[t knee ap right]
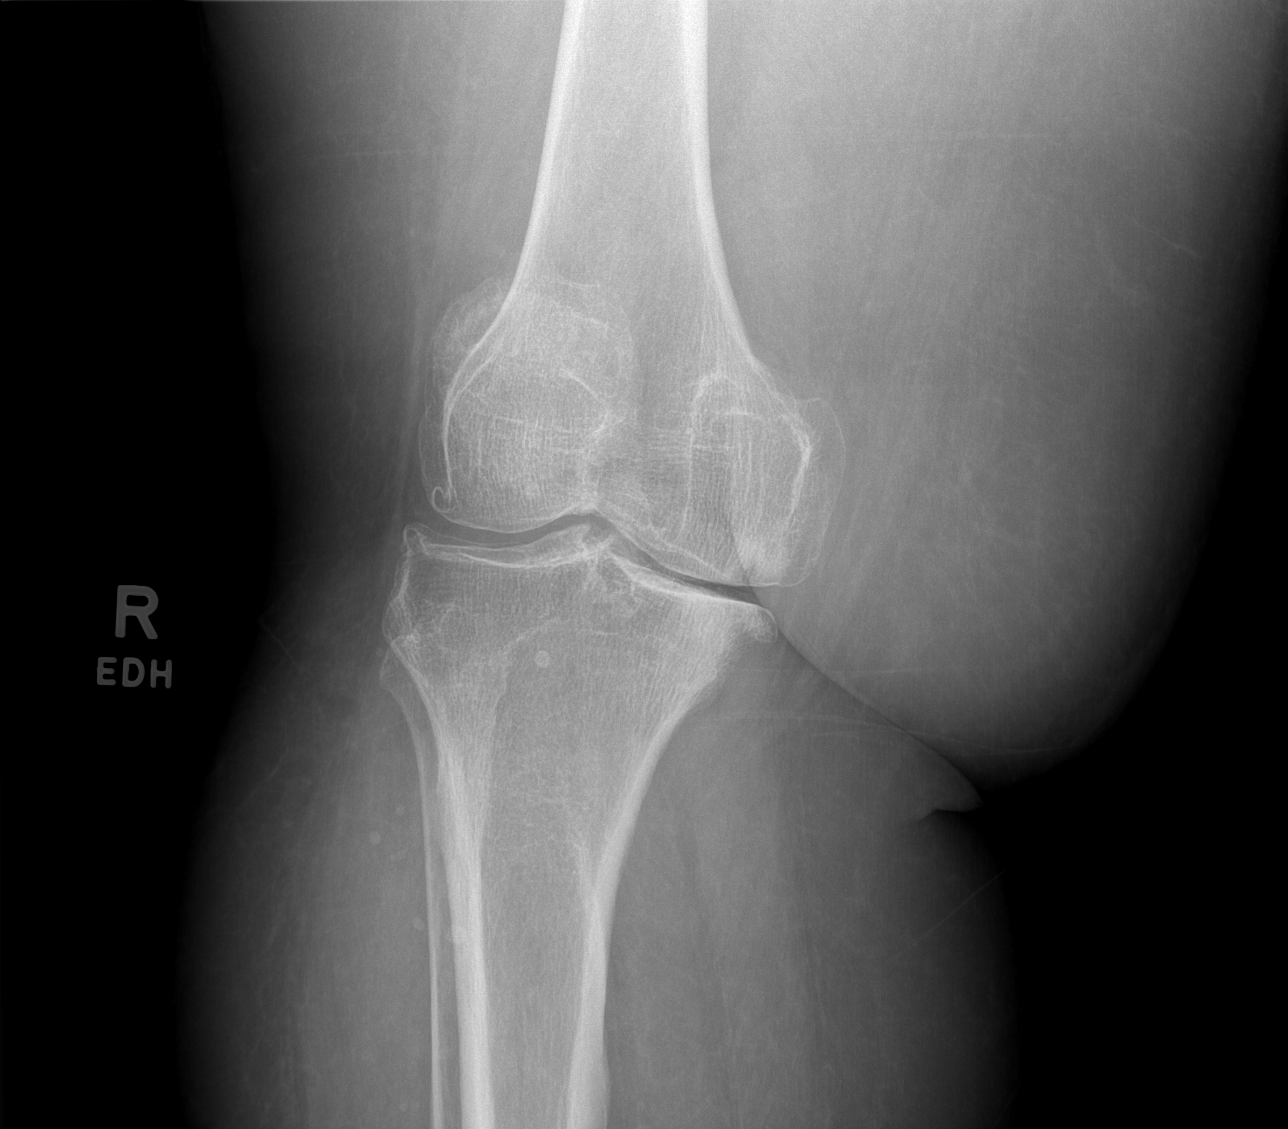

[t knee oblique right (1 of 2)]
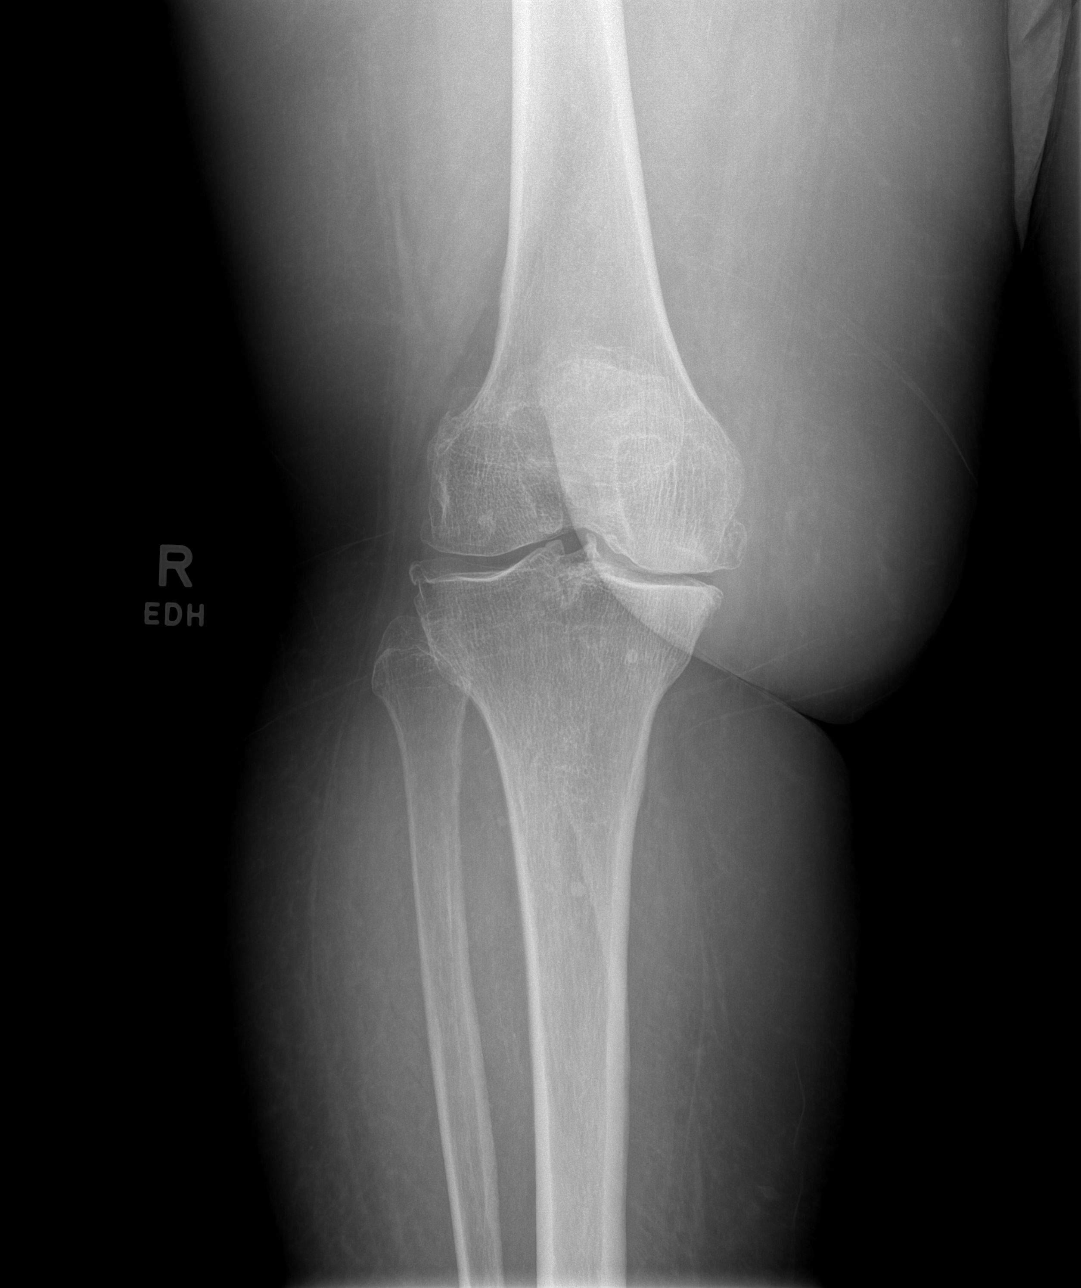

[t knee oblique right (2 of 2)]
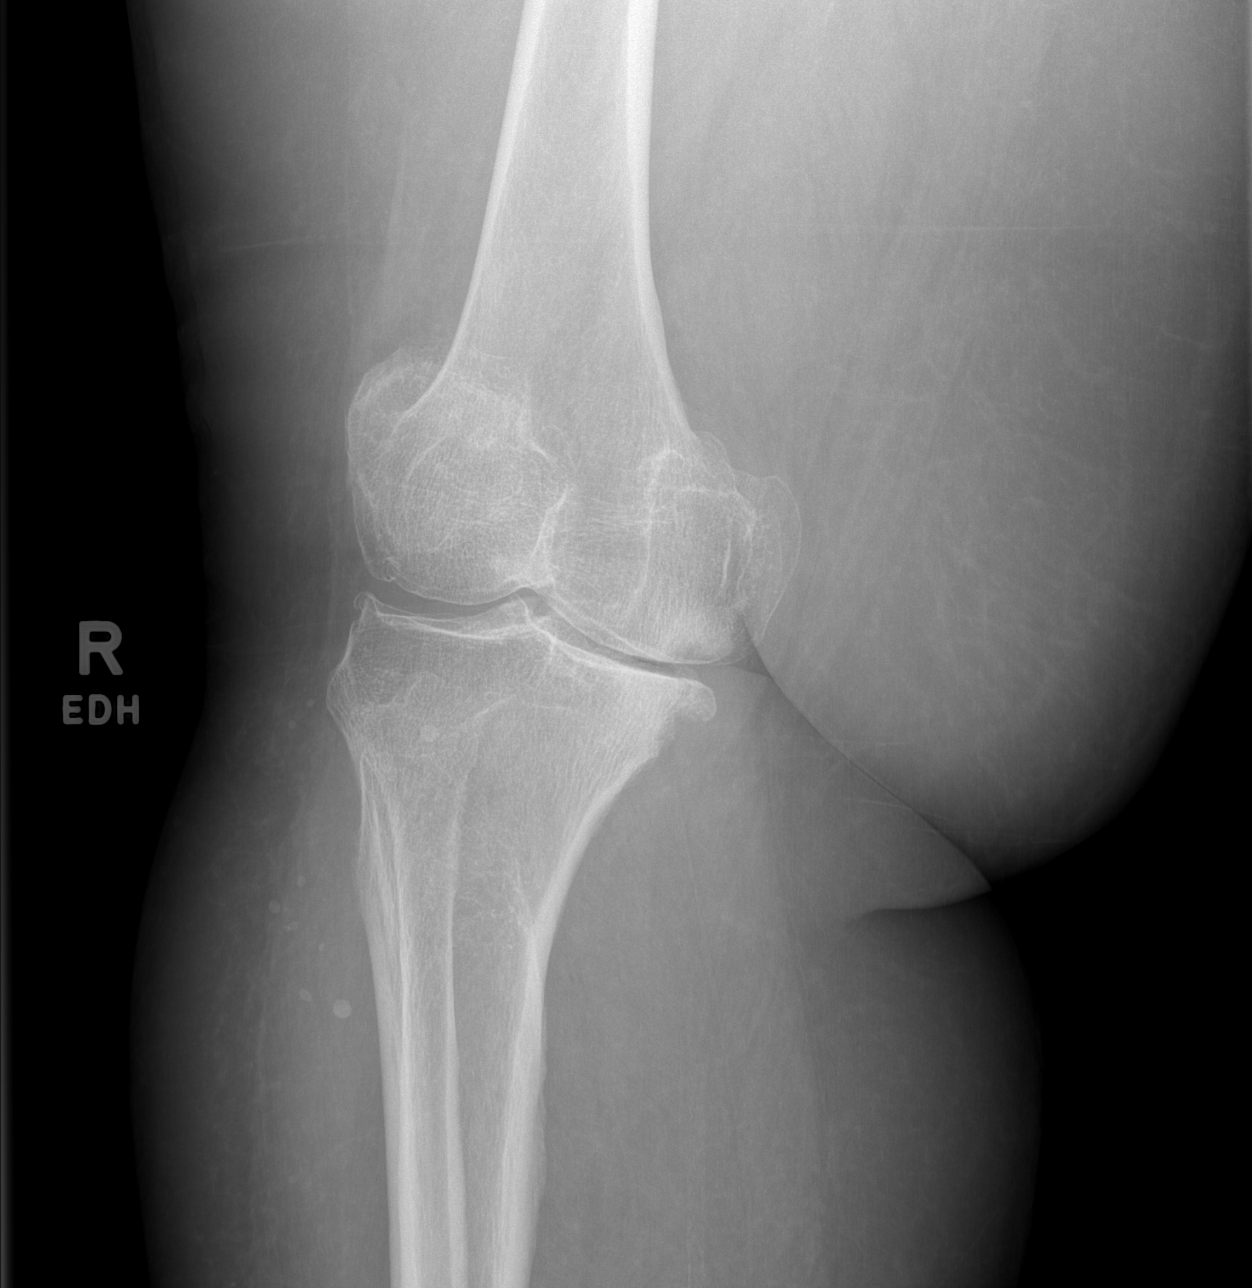

[t knee lat right]
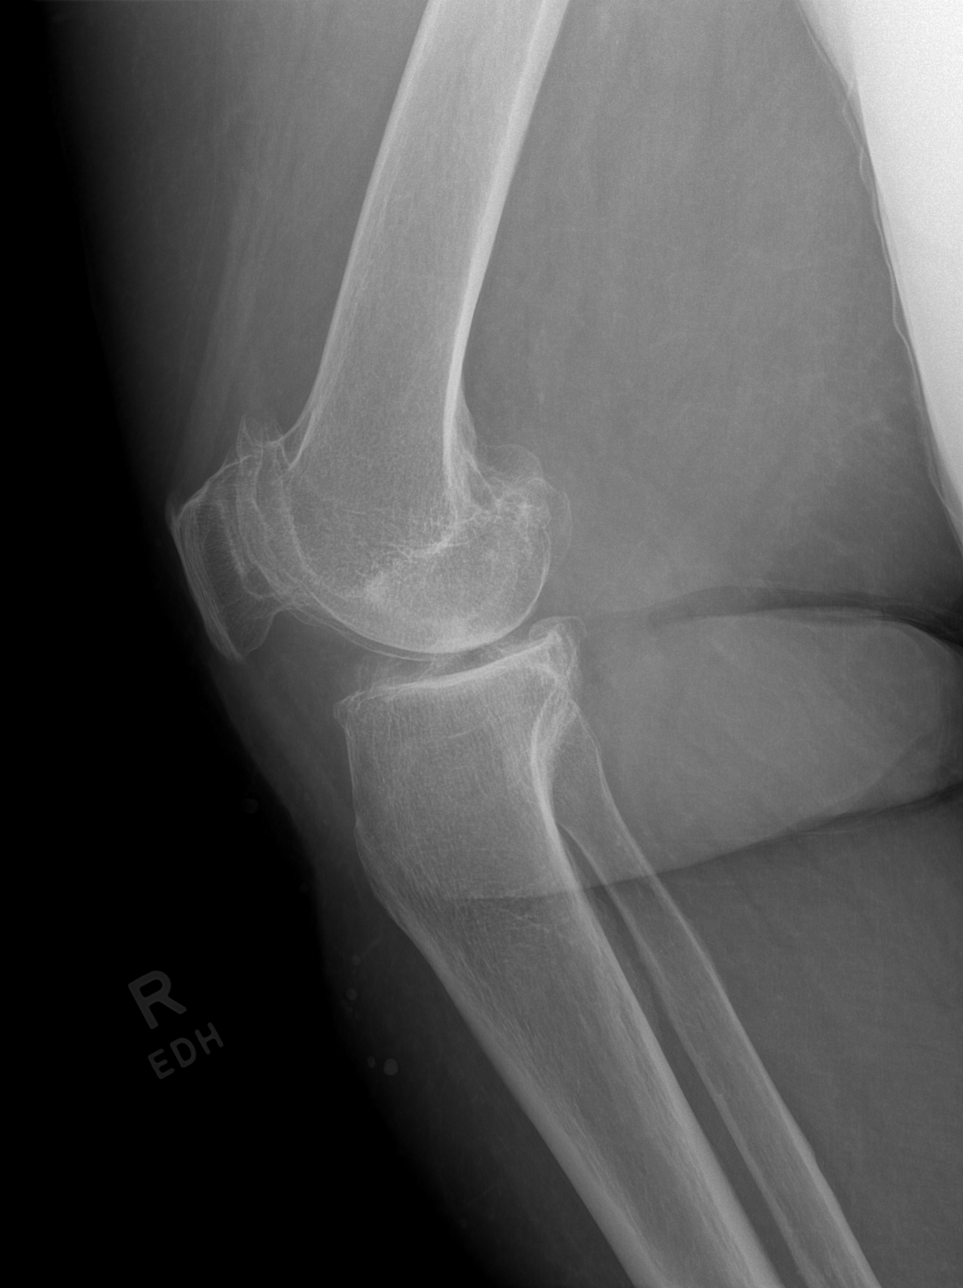

[4 of 4 positions shown; findings below may reference images not displayed]

FINDINGS: There are advanced multicompartmental degenerative changes of the
right knee, greatest within the medial and patellofemoral
compartments. There is no significant joint effusion. No displaced
fracture or dislocation. No radiopaque foreign body.
IMPRESSION: 1. No acute osseous abnormality.
2. Advanced osteoarthritis of the right knee.

## 2019-04-15 ENCOUNTER — Other Ambulatory Visit: Payer: Self-pay

## 2019-04-15 DIAGNOSIS — Z20822 Contact with and (suspected) exposure to covid-19: Secondary | ICD-10-CM

## 2019-04-16 LAB — NOVEL CORONAVIRUS, NAA: SARS-CoV-2, NAA: NOT DETECTED

## 2019-04-20 ENCOUNTER — Ambulatory Visit (INDEPENDENT_AMBULATORY_CARE_PROVIDER_SITE_OTHER): Payer: Medicare Other | Admitting: Endocrinology

## 2019-04-20 ENCOUNTER — Encounter: Payer: Self-pay | Admitting: Endocrinology

## 2019-04-20 ENCOUNTER — Other Ambulatory Visit: Payer: Self-pay

## 2019-04-20 VITALS — Ht 63.0 in | Wt 291.0 lb

## 2019-04-20 DIAGNOSIS — I10 Essential (primary) hypertension: Secondary | ICD-10-CM

## 2019-04-20 DIAGNOSIS — E890 Postprocedural hypothyroidism: Secondary | ICD-10-CM

## 2019-04-20 NOTE — Progress Notes (Signed)
Patient ID: Sheila Jacobs, female   DOB: 04-Mar-1951, 68 y.o.   MRN: [id number]           Reason for Appointment: HYPOTHYROIDISM, follow-up   Today's office visit was provided via telemedicine using phone call Explained to the patient and the the limitations of evaluation and management by telemedicine and the availability of in person appointments.  The patient understood the limitations and agreed to proceed. Patient also understood that the telehealth visit is billable. . Location of the patient: Home . 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, mostly right sided  Because of her local pressure symptoms with choking feeling, coughing she was referred for thyroidectomy which was done in June 2018 This had relieved her local pressure symptoms  RECENT history:  She has been previously on 150 mcg of levothyroxine supplementation for her postsurgical hypothyroidism since about 08/2017  Frequently she has been concerned that her thyroid supplement was causing side effects which were varied in nature  She frequently complains of her throat being uncomfortable or tight. However she has been found to have reflux from the ENT physician and is on Protonix She still has intermittent complaints  Also has had occasional palpitations as before Because of her TSH being 0.22 on 4/30 her dose was reduced to 137 mcg  She generally feels fairly good and has no fatigue Her weight is about the same She again is having some discomfort in  neck and throat area but no other specific complaints   TSH is now slightly below normal, previously 1.2  Wt Readings from Last 3 Encounters:  04/20/19 291 lb (132 kg)  04/06/19 287 lb (130.2 kg)  10/22/18 289 lb (131.1 kg)      Lab Results  Component Value Date   FREET4 1.43 04/14/2019   FREET4 1.48 01/08/2019   FREET4 1.20 06/11/2018   TSH 0.84 04/14/2019   TSH 0.22 (L) 01/08/2019   TSH 1.16 06/11/2018       Allergies as of 04/20/2019      Reactions   Cortisone Rash   Effexor [venlafaxine] Palpitations   Lexapro [escitalopram] Palpitations   Heart racing   Xarelto [rivaroxaban] Rash      Medication List       Accurate as of April 20, 2019 11:33 AM. If you have any questions, ask your nurse or doctor.        STOP taking these medications   trolamine salicylate 10 % cream Commonly known as: Asper-Flex Stopped by: Elayne Snare, MD     TAKE these medications   anastrozole 1 MG tablet Commonly known as: ARIMIDEX Take 1 tablet (1 mg total) by mouth daily.   apixaban 5 MG Tabs tablet Commonly known as: ELIQUIS Take 1 tablet (5 mg total) by mouth 2 (two) times daily.   hydrochlorothiazide 25 MG tablet Commonly known as: HYDRODIURIL Take 1 tablet (25 mg total) by mouth daily.   ketoconazole 2 % cream Commonly known as: NIZORAL APPLY TOPICALLY EVERY DAY  AS NEEDED FOR IRRITATION   levothyroxine 137 MCG tablet Commonly known as: Synthroid Take 1 tablet (137 mcg total) by mouth daily before breakfast.   pantoprazole 40 MG tablet Commonly known as: PROTONIX Take 1 tablet (40 mg total) by mouth daily.   potassium chloride 10 MEQ tablet Commonly known as: K-DUR TAKE 3 TABLETS BY MOUTH THE FIRST 2 DAYS, THEN 1 TABKET ONCE A DAY       Allergies:  Allergies  Allergen Reactions  . Cortisone Rash  . Effexor [Venlafaxine] Palpitations  . Lexapro [Escitalopram] Palpitations    Heart racing  . Xarelto [Rivaroxaban] Rash    Past Medical History:   Diagnosis Date  . Acute saddle pulmonary embolism without acute cor pulmonale ([place]) 12/29/2017  . Anginal pain ([place])   . Arthritis   . Breast cancer of upper-outer quadrant of left female breast ([place]) 01/31/2016  . Colon polyps   . GERD (gastroesophageal reflux disease)   . History of radiation therapy 10/9-11/20/17   Left Breast 45 Gy in 25 fractions, Left Breast Boost 6 Gy in 3 fractions.   . Hyperlipidemia   . Hypertension   . Morbid obesity ([place])   . OSA (obstructive sleep apnea) 01/23/2018  . Palpitations 10/16/2016  . Personal history of noncompliance with medical treatment, presenting hazards to health 08/27/2016  . Thyroid goiter     Past Surgical History:  Procedure Laterality Date  . BREAST LUMPECTOMY Left 2017   radiation  . BREAST LUMPECTOMY WITH RADIOACTIVE SEED AND SENTINEL LYMPH NODE BIOPSY Left 04/11/2016   Procedure: BREAST LUMPECTOMY WITH RADIOACTIVE SEED AND SENTINEL LYMPH NODE BIOPSY;  Surgeon: Excell Seltzer, MD;  Location: [place];  Service: General;  Laterality: Left;  . COLONOSCOPY WITH PROPOFOL N/A 10/20/2015   Procedure: COLONOSCOPY WITH PROPOFOL;  Surgeon: Mauri Pole, MD;  Location: [facility];  Service: Endoscopy;  Laterality: N/A;  . ESOPHAGOGASTRODUODENOSCOPY (EGD) WITH PROPOFOL N/A 10/20/2015   Procedure: ESOPHAGOGASTRODUODENOSCOPY (EGD) WITH PROPOFOL;  Surgeon: Mauri Pole, MD;  [facility]: [facility];  Service: Endoscopy;  Laterality: N/A;  . IR ANGIOGRAM PULMONARY BILATERAL SELECTIVE  12/29/2017  . IR ANGIOGRAM SELECTIVE EACH ADDITIONAL VESSEL  12/29/2017  . IR ANGIOGRAM SELECTIVE EACH ADDITIONAL VESSEL  12/29/2017  . IR INFUSION THROMBOL ARTERIAL INITIAL (MS)  12/29/2017  . IR INFUSION THROMBOL ARTERIAL INITIAL (MS)  12/29/2017  . IR THROMB F/U EVAL ART/VEN FINAL DAY (MS)  12/30/2017  . IR US GUIDE VASC ACCESS RIGHT  12/29/2017  . LEFT HEART CATHETERIZATION WITH CORONARY ANGIOGRAM N/A 10/21/2014   Procedure: LEFT HEART CATHETERIZATION WITH  CORONARY ANGIOGRAM;  Surgeon: Burnell Blanks, MD;  Location: [facility] CATH LAB;  Service: Cardiovascular;  Laterality: N/A;  . THYROIDECTOMY N/A 02/26/2017   Procedure: TOTAL THYROIDECTOMY;  Surgeon: Excell Seltzer, MD;  Location: [facility];  Service: General;  Laterality: N/A;  . TUBAL LIGATION      Family History  Problem Relation Age of Onset  . Hypertension Mother   . Heart disease Mother        Pacemaker  . Kidney disease Mother   . Cataracts Mother   . Hearing loss Mother   . Diabetes Mother   . Liver disease Mother   . Diabetes Sister   . Cataracts Brother   . Hearing loss Brother   . Colon cancer Neg Hx   . Thyroid disease Neg Hx     Social History:  reports that she quit smoking about 4 years ago. Her smoking use included cigarettes. She  has a 45.00 pack-year smoking history. She has never used smokeless tobacco. She reports that she does not drink alcohol or use drugs.    Review of Systems     Hypertension, using hydrochlorothiazide  Has not been able to check her blood pressure at home recently  BP Readings from Last 3 Encounters:  10/22/18 140/70  09/30/18 128/62  09/09/18 (!) 144/74      Examination:   Ht 5\' 3"  (1.6 m)   Wt 291 lb (132 kg)   BMI 51.55 kg/m             Assessment/Plan:  Postsurgical hypothyroidism  Currently taking 137 mcg levothyroxine  As before she has nonspecific chest and neck/throat symptoms from time to time and unrelated to her thyroid Also has issues with reflux according to ENT consultation  She has been quite regular with taking her levothyroxine as directed before breakfast on empty stomach   TSH is now back to normal, previously on 150 mcg it was 0.22 which is relatively low for age  TSH is back to normal and she will continue the same dose  HYPERTENSION: She will continue follow-up with her PCP  Follow-up in 4 months with repeat thyroid functions  Elayne Snare 04/20/2019  Duration of telephone  encounter =6 minutes

## 2019-04-22 ENCOUNTER — Ambulatory Visit: Payer: Medicare Other | Attending: Family | Admitting: Rehabilitation

## 2019-04-22 ENCOUNTER — Encounter: Payer: Self-pay | Admitting: Rehabilitation

## 2019-04-22 ENCOUNTER — Other Ambulatory Visit: Payer: Self-pay

## 2019-04-22 DIAGNOSIS — I890 Lymphedema, not elsewhere classified: Secondary | ICD-10-CM | POA: Diagnosis present

## 2019-04-22 DIAGNOSIS — R2689 Other abnormalities of gait and mobility: Secondary | ICD-10-CM | POA: Insufficient documentation

## 2019-04-22 NOTE — Therapy (Signed)
[place], [place], [address] Phone: [phone]   Fax:  [phone]  Physical Therapy Evaluation  Patient Details  Name: Sheila Jacobs MRN: [id number] Date of Birth: [birth year] Referring Provider (PT): Novella Rob, FNP   Encounter Date: 04/22/2019  PT End of Session - 04/22/19 1453    Visit Number  1    Number of Visits  13    Date for PT Re-Evaluation  06/17/19    PT Start Time  1300    PT Stop Time  8850    PT Time Calculation (min)  47 min    Activity Tolerance  Patient tolerated treatment well    Behavior During Therapy  [facility] for tasks assessed/performed       Past Medical History:  Diagnosis Date  . Acute saddle pulmonary embolism without acute cor pulmonale ([place]) 12/29/2017  . Anginal pain ([place])   . Arthritis   . Breast cancer of upper-outer quadrant of left female breast ([place]) 01/31/2016  . Colon polyps   . GERD (gastroesophageal reflux disease)   . History of radiation therapy 10/9-11/20/17   Left Breast 45 Gy in 25 fractions, Left Breast Boost 6 Gy in 3 fractions.   . Hyperlipidemia   . Hypertension   . Morbid obesity ([place])   . OSA (obstructive sleep apnea) 01/23/2018  . Palpitations 10/16/2016  . Personal history of noncompliance with medical treatment, presenting hazards to health 08/27/2016  . Thyroid goiter     Past Surgical History:  Procedure Laterality Date  . BREAST LUMPECTOMY Left 2017   radiation  . BREAST LUMPECTOMY WITH RADIOACTIVE SEED AND SENTINEL LYMPH NODE BIOPSY Left 04/11/2016   Procedure: BREAST LUMPECTOMY WITH RADIOACTIVE SEED AND SENTINEL LYMPH NODE BIOPSY;  Surgeon: Excell Seltzer, MD;  Location: [place];  Service: General;  Laterality: Left;  . COLONOSCOPY WITH PROPOFOL N/A 10/20/2015   Procedure: COLONOSCOPY WITH PROPOFOL;  Surgeon: Mauri Pole, MD;  [facility];  Service: Endoscopy;  Laterality: N/A;  . ESOPHAGOGASTRODUODENOSCOPY (EGD) WITH  PROPOFOL N/A 10/20/2015   Procedure: ESOPHAGOGASTRODUODENOSCOPY (EGD) WITH PROPOFOL;  Surgeon: Mauri Pole, MD;  [facility];  Service: Endoscopy;  Laterality: N/A;  . IR ANGIOGRAM PULMONARY BILATERAL SELECTIVE  12/29/2017  . IR ANGIOGRAM SELECTIVE EACH ADDITIONAL VESSEL  12/29/2017  . IR ANGIOGRAM SELECTIVE EACH ADDITIONAL VESSEL  12/29/2017  . IR INFUSION THROMBOL ARTERIAL INITIAL (MS)  12/29/2017  . IR INFUSION THROMBOL ARTERIAL INITIAL (MS)  12/29/2017  . IR THROMB F/U EVAL ART/VEN FINAL DAY (MS)  12/30/2017  . IR US GUIDE VASC ACCESS RIGHT  12/29/2017  . LEFT HEART CATHETERIZATION WITH CORONARY ANGIOGRAM N/A 10/21/2014   Procedure: LEFT HEART CATHETERIZATION WITH CORONARY ANGIOGRAM;  Surgeon: Burnell Blanks, MD;  Location: [facility] C[facility] LAB;  Service: Cardiovascular;  Laterality: N/A;  . THYROIDECTOMY N/A 02/26/2017   Procedure: TOTAL THYROIDECTOMY;  Surgeon: Excell Seltzer, MD;  [facility];  Service: General;  Laterality: N/A;  . TUBAL LIGATION      There were no vitals filed for this visit.   Subjective Assessment - 04/22/19 1259    Subjective  i am having swelling in my legs x 1 year or so.  It comes and goes.  I have Rt knee OA which makes me walk with a cane. (pt HOH).  I have used compression stockings but they cut in to me    Pertinent History  04/11/16 Lt lumpectomy by Dr. Excell Seltzer DCIS 0/8 nodes negative.  Radiation completed and taking letrozole.  History of saddle pulmonary embolism 12/29/17 with lifetime anticoagulation (Eliquis). Good heart and kidney function per chart.  Other history HTN, knee OA, chronic smoker, and thyroidectomy 02/25/18.    Limitations  Walking    Patient Stated Goals  decrease the swelling    Currently in Pain?  No/denies   sometimes my legs hurt not now        [facility] PT Assessment - 04/22/19 0001      Assessment   Medical Diagnosis  CVI/lymphedema    Referring Provider (PT)  Novella Rob, FNP    Onset Date/Surgical Date   04/10/18    Hand Dominance  Right    Prior Therapy  no      Precautions   Precaution Comments  PE, breast cancer history Rt UE      Restrictions   Weight Bearing Restrictions  No      Balance Screen   Has the patient fallen in the past 6 months  No    Has the patient had a decrease in activity level because of a fear of falling?   No    Is the patient reluctant to leave their home because of a fear of falling?   No      Home Film/video editor residence    Living Arrangements  Children    Available Help at Discharge  Family      Prior Function   Level of Independence  Independent    Vocation  Unemployed      Cognition   Overall Cognitive Status  Within Functional Limits for tasks assessed      Observation/Other Assessments   Observations  morbid obesity      Sensation   Light Touch  Appears Intact      Coordination   Gross Motor Movements are Fluid and Coordinated  Yes      Ambulation/Gait   Gait Comments  walks with SPC on the Lt wide BOS, shuffling gait        LYMPHEDEMA/ONCOLOGY QUESTIONNAIRE - 04/22/19 1321      Type   Cancer Type  --   history of Lt breast cancer in 2017 unrelated to LE edema     Lymphedema Assessments   Lymphedema Assessments  Lower extremities      Right Lower Extremity Lymphedema   10 cm Proximal to Suprapatella  74.5 cm    At Midpatella/Popliteal Crease  67.5 cm    30 cm Proximal to Floor at Lateral Plantar Foot  62.3 cm    20 cm Proximal to Floor at Lateral Plantar Foot  52 1    10 cm Proximal to Floor at Lateral Malleoli  37 cm    5 cm Proximal to 1st MTP Joint  28.8 cm    Across MTP Joint  27.8 cm    Around Proximal Great Toe  10 cm      Left Lower Extremity Lymphedema   10 cm Proximal to Suprapatella  72.5 cm    At Midpatella/Popliteal Crease  63 cm    30 cm Proximal to Floor at Lateral Plantar Foot  59 cm    20 cm Proximal to Floor at Lateral Plantar Foot  46.7 cm    10 cm Proximal to Floor at  Lateral Malleoli  33.5 cm    5 cm Proximal to 1st MTP Joint  27.3 cm    Across MTP Joint  26.3 cm    Around  Proximal Great Toe  9.8 cm             Outpatient Rehab from 04/22/2019 in [place]  Lymphedema Life Impact Scale Total Score  20.59 %      Objective measurements completed on examination: See above findings.              PT Education - 04/22/19 1452    Education Details  lymphedema, POC, compression options    Person(s) Educated  Patient    Methods  Explanation    Comprehension  Verbalized understanding;Need further instruction          PT Long Term Goals - 04/22/19 1501      PT LONG TERM GOAL #1   Title  Pt will be independent with self compression and lymphedema care at home    Time  6    Period  Weeks    Status  New      PT LONG TERM GOAL #2   Title  Pt will be educated on the importance of mobility and physical activity for swelling and overall health    Time  6    Period  Weeks    Status  New      PT LONG TERM GOAL #3   Title  Pt will decrease lower leg circumference by at least 2 cm    Baseline  Rt: 37, 52, 62.3   Lt: 33.5, 46.7, 59    Time  6    Period  Weeks    Status  New             Plan - 04/22/19 1454    Clinical Impression Statement  Pt presents with bilateral phlebolymphedema due to obesity and CVI.  Pitting edema is present to the knee bilaterally hard to tell if thigh size is adipose or also swelling.  Discussed treatment of bandaging 3x per week for CDT with pt not wanting to do this.  "I think I just need to lose weight." After education on lymphedema pt is willing to get circaid reduction kits for the LEs to manage swelling.  Pt does report it feels pretty good in the morning.  Pt agreeable to getting measured by Melissa on monday and was given A speical place information with instruction to return here with her garments for fitting and CDT/exercise.    Personal Factors and  Comorbidities  Time since onset of injury/illness/exacerbation;Behavior Pattern;Past/Current Experience    Examination-Activity Limitations  Stand;Locomotion Level;Stairs    Examination-Participation Restrictions  Cleaning;Meal Prep;Community Activity    Stability/Clinical Decision Making  Stable/Uncomplicated    Clinical Decision Making  Low    Rehab Potential  Fair    PT Frequency  3x / week    PT Duration  4 weeks    PT Treatment/Interventions  ADLs/Self Care Home Management;Manual lymph drainage;Patient/family education;Compression bandaging;Therapeutic exercise;Taping;Gait training    PT Next Visit Plan  fit circaids to the LE with instruction on use of these, if pt agreeable could advise MLD in clinic with circaids or self MLD instruction, also general exercise and mobility    Recommended Other Services  garment fitting; demo sent to [facility] for benefits.    Consulted and Agree with Plan of Care  Patient       Patient will benefit from skilled therapeutic intervention in order to improve the following deficits and impairments:  Abnormal gait, Increased edema, Difficulty walking  Visit Diagnosis: 1. Lymphedema, not elsewhere classified   2. Other abnormalities  of gait and mobility        Problem List Patient Active Problem List   Diagnosis Date Noted  . Medication management 10/22/2018  . History of pulmonary embolus (PE), 12/2017 10/22/2018  . Chronic anticoagulation 10/22/2018  . Tinea corporis 08/22/2018  . Right leg swelling 08/21/2018  . Pain and swelling of ankle, right 04/24/2018  . OSA (obstructive sleep apnea) 01/23/2018  . Hypothyroidism   . Hypoxia   . Acute saddle pulmonary embolism without acute cor pulmonale ([place]) 12/29/2017  . History of breast cancer 10/23/2017  . Post-surgical hypothyroidism 06/27/2017  . Thyroid goiter 02/26/2017  . Personal history of noncompliance with medical treatment, presenting hazards to health 08/27/2016  . Generalized anxiety  disorder 08/09/2016  . Abnormal TSH 05/09/2016  . Abnormality of gait 01/31/2016  . Low back pain 01/31/2016  . Breast cancer of upper-outer quadrant of left female breast ([place]) 01/31/2016  . Former heavy cigarette smoker (20-39 per day) 11/08/2015  . Esophageal reflux   . GERD (gastroesophageal reflux disease) 05/03/2015  . Dependent edema 05/03/2015  . Morbid obesity ([place]) 04/29/2015  . Right carotid bruit 10/06/2014  . Atypical chest pain 09/18/2014  . Hypertension 09/18/2014  . Hyperlipidemia 09/18/2014  . Enlarged LA (left atrium)     Stark Bray 04/22/2019, 3:04 PM  [place], [place], [address] Phone: [phone]   Fax:  [phone]  Name: Sheila Jacobs MRN: [id number] Date of Birth: [birth year]

## 2019-04-22 NOTE — Patient Instructions (Signed)
[facility] [address] Make an appointment at [phone] [place]  Or  12pm 8/17 here with Lenna Sciara  Make sure to walk, elevate the legs!

## 2019-04-27 ENCOUNTER — Other Ambulatory Visit: Payer: Self-pay | Admitting: *Deleted

## 2019-04-27 ENCOUNTER — Encounter: Payer: Self-pay | Admitting: *Deleted

## 2019-04-27 NOTE — Patient Outreach (Signed)
Outreach call to pt for telephone assessment, spoke with pt, HIPAA verified, pt reports she still has not sent the BP cuff back to [employer] but she intends to.  Pt states she has all medications and taking as prescribed, has appointment to have legs measured for compression hose due to LE edema, had xray on right knee due to ongoing pain and waiting on MD to call and discuss results.  RN CM to mail pt BP cuff and pt asks that it be sent to her daughter's home at [place]. [place] [address] due to sometimes things get stolen where pt lives.  THN CM Care Plan Problem One     Most Recent Value  Care Plan Problem One  Knowledge deficit related to hypertension  Role Documenting the Problem One  Care Management [place] for Problem One  Active  THN Long Term Goal   Pt will verbalize improved self care related to overall health/ hypertension within 60 days  THN Long Term Goal Start Date  04/06/19  Interventions for Problem One Long Term Goal  RN CM reviewed plan of care with pt, pt states she did receive masks and low sodium poster and is appreciative, pt reports she has all medications and taking as prescribed  THN CM Short Term Goal #1   Pt will verbalize low sodium diet and choose foods low in sodium within 30 days  THN CM Short Term Goal #1 Start Date  04/27/19 [goal re-established]  Interventions for Short Term Goal #1  RN CM reviewed low sodium food choices, pt states she will look at the low sodium poster, states she is trying to be careful with sodium intake  THN CM Short Term Goal #2   Pt will have BP cuff and start monitoring BP several times weekly within 30 days  THN CM Short Term Goal #2 Start Date  04/27/19 [goal re-established]  Interventions for Short Term Goal #2  Pt states she did not mail BP cuff back to [employer] but plans to, not sure when she will do it and cannot get a BP cuff from anywhere else until she gets her check.  RN CM mailed pt a BP cuff  and let pt know this will be arriving and ask pt to start checking BP and recording      PLAN Outreach pt next month for telephone assessment Assess BP readings  Jacqlyn Larsen [facility], BSN [place] Coordinator [phone]

## 2019-04-30 ENCOUNTER — Telehealth: Payer: Self-pay

## 2019-04-30 NOTE — Telephone Encounter (Signed)
Ms. Deangelo was at [facility] to be fitted for compression garments, so fitter Katrina was calling on behalf of patient to find out what type of garments to order. This therapist had not seen patient so in reviewing last PT note it was determined pt has Medicaid which will not be covered at that store. It was suggested to pt to wait until 05/11/19 to be seen at our clinic by another fitter for [facility] as this company will cover Medicaid and garments should be covered fully. Pt agreeable to wait instead of paying out of pocket at [place]. Fitter Katrina relayed this to pt and therapist could hear pt in background agreeing.

## 2019-05-25 ENCOUNTER — Other Ambulatory Visit: Payer: Self-pay | Admitting: *Deleted

## 2019-05-25 NOTE — Patient Outreach (Signed)
Outreach call to pt for telephone assessment, no answer to telephone and no option to leave voicemail,  RN CM mailed unsuccessful outreach letter to pt home.  PLAN  Outreach pt in 3-4 business days  Navaeh Kehres RNC, BSN THN Community Care Coordinator [phone]   

## 2019-05-26 ENCOUNTER — Other Ambulatory Visit: Payer: Self-pay | Admitting: Family Medicine

## 2019-05-26 DIAGNOSIS — Z9889 Other specified postprocedural states: Secondary | ICD-10-CM

## 2019-05-27 ENCOUNTER — Telehealth: Payer: Self-pay | Admitting: *Deleted

## 2019-05-27 ENCOUNTER — Ambulatory Visit: Payer: Medicare Other | Admitting: *Deleted

## 2019-05-27 NOTE — Telephone Encounter (Signed)

## 2019-05-28 ENCOUNTER — Other Ambulatory Visit: Payer: Self-pay | Admitting: *Deleted

## 2019-05-28 NOTE — Patient Outreach (Signed)
Telephone assessment outreach to follow up on pt HTN. Sheila Larsen, RN, had arranged for her to receive a BP cuff. Need to establish is she has received it and started monitoring. Also verify taking meds routinely and following a low sodium diet.  Pt did not answer her phone and her mailbox was not set up. Called her daughter and left a message requested that she ask her mother to call me.  Sheila Jacobs. Sheila Neither, MSN, [facility] Gerontological Nurse Practitioner [facility] Care Management [phone]

## 2019-05-29 ENCOUNTER — Other Ambulatory Visit: Payer: Self-pay | Admitting: *Deleted

## 2019-05-29 NOTE — Patient Outreach (Signed)
Mrs. Reidinger called me back today. She said she has a new phone and she does not know how to retrieve her messages.  She reports she did receive her BP monitor but has not used it yet. She has her small grandchildren there and they are rambunctious. She opened the BP Box but was not able to use the monitor.  She said her daughter or grand daughter can help her with her phone and the monititor.  Called her daughter and requested she assist her mother to set up her voice mail box on her phone and show her how to use it and to help her use the BP monitor and provide her with a place to write down her readings.  Asked pt to take her BP at lunch and write it down or have someone assist her that can help her.   We talked about the reason to avoid salt, causes water retention and can raise her BP. Also talked about choosing fresh veggies and fruits when she can, avoid processed foods. Have who ever is grocery shopping look for lowest sodium content, with goal of 2000mg  a day.  Jacqlyn Larsen, RN, will call her next week.  Eulah Pont. Myrtie Neither, MSN, [facility] Gerontological Nurse Practitioner [facility] Care Management [phone]

## 2019-06-03 NOTE — Progress Notes (Signed)
Virtual Visit via Video Note   This visit type was conducted due to national recommendations for restrictions regarding the COVID-19 Pandemic (e.g. social distancing) in an effort to limit this patient's exposure and mitigate transmission in our community.  Due to her co-morbid illnesses, this patient is at least at moderate risk for complications without adequate follow up.  This format is felt to be most appropriate for this patient at this time.  All issues noted in this document were discussed and addressed.  A limited physical exam was performed with this format.  Please refer to the patient's chart for her consent to telehealth for [facility]   Evaluation Performed:  Follow-up visit  This visit type was conducted due to national recommendations for restrictions regarding the COVID-19 Pandemic (e.g. social distancing).  This format is felt to be most appropriate for this patient at this time.  All issues noted in this document were discussed and addressed.  No physical exam was performed (except for noted visual exam findings with Video Visits).  Please refer to the patient's chart (MyChart message for video visits and phone note for telephone visits) for the patient's consent to telehealth for [facility].  Date:  06/03/2019   ID:  Sheila Jacobs, DOB [birth year], MRN [id number]  Patient Location:  Home  Provider location:   [place]  PCP:  [name], FNP  Cardiologist:  Minus Breeding, MD  Sleep Medicine:  Fransico Him, MD Electrophysiologist:  None   Chief Complaint:  OSA, HTN  History of Present Illness:    Sheila Jacobs is a 68 y.o. female who presents via audio/video conferencing for a telehealth visit today.    Sheila Jacobs is a 68 y.o. female with a hx of  bradycardia, HTN, obesity who was referred by Dr. [name] for sleep study.  ESS was 2.  She says that she has been told that she snores badly but since going on CPAP this has resolved. She  was found to have severe OSA with an AHI of 30/hr and O2 desats as low as 80%.  She underwent CPAP titration to 15cm H2O.   She is doing well with her CPAP device.  She tolerates the mask and feels the pressure is adequate.  Since going on CPAP she feels rested in the am and has no significant daytime sleepiness.  She denies any significant mouth or nasal dryness or nasal congestion.  She does not think that he snores.    The patient does not have symptoms concerning for COVID-19 infection (fever, chills, cough, or new shortness of breath).    Prior CV studies:   The following studies were reviewed today:  PAP compliance download  Past Medical History:  Diagnosis Date  . Acute saddle pulmonary embolism without acute cor pulmonale ([place]) 12/29/2017  . Anginal pain ([place])   . Arthritis   . Breast cancer of upper-outer quadrant of left female breast ([place]) 01/31/2016  . Colon polyps   . GERD (gastroesophageal reflux disease)   . History of radiation therapy 10/9-11/20/17   Left Breast 45 Gy in 25 fractions, Left Breast Boost 6 Gy in 3 fractions.   . Hyperlipidemia   . Hypertension   . Morbid obesity ([place])   . OSA (obstructive sleep apnea) 01/23/2018  . Palpitations 10/16/2016  . Personal history of noncompliance with medical treatment, presenting hazards to health 08/27/2016  . Thyroid goiter    Past Surgical History:  Procedure Laterality Date  .  BREAST LUMPECTOMY Left 2017   radiation  . BREAST LUMPECTOMY WITH RADIOACTIVE SEED AND SENTINEL LYMPH NODE BIOPSY Left 04/11/2016   Procedure: BREAST LUMPECTOMY WITH RADIOACTIVE SEED AND SENTINEL LYMPH NODE BIOPSY;  Surgeon: Excell Seltzer, MD;  Location: [place];  Service: General;  Laterality: Left;  . COLONOSCOPY WITH PROPOFOL N/A 10/20/2015   Procedure: COLONOSCOPY WITH PROPOFOL;  Surgeon: Mauri Pole, MD;  [facility];  Service: Endoscopy;  Laterality: N/A;  . ESOPHAGOGASTRODUODENOSCOPY (EGD) WITH PROPOFOL N/A 10/20/2015    Procedure: ESOPHAGOGASTRODUODENOSCOPY (EGD) WITH PROPOFOL;  Surgeon: Mauri Pole, MD;  [facility];  Service: Endoscopy;  Laterality: N/A;  . IR ANGIOGRAM PULMONARY BILATERAL SELECTIVE  12/29/2017  . IR ANGIOGRAM SELECTIVE EACH ADDITIONAL VESSEL  12/29/2017  . IR ANGIOGRAM SELECTIVE EACH ADDITIONAL VESSEL  12/29/2017  . IR INFUSION THROMBOL ARTERIAL INITIAL (MS)  12/29/2017  . IR INFUSION THROMBOL ARTERIAL INITIAL (MS)  12/29/2017  . IR THROMB F/U EVAL ART/VEN FINAL DAY ([place])  12/30/2017  . IR US GUIDE VASC ACCESS RIGHT  12/29/2017  . LEFT HEART CATHETERIZATION WITH CORONARY ANGIOGRAM N/A 10/21/2014   Procedure: LEFT HEART CATHETERIZATION WITH CORONARY ANGIOGRAM;  Surgeon: Burnell Blanks, MD;  [facility];  Service: Cardiovascular;  Laterality: N/A;  . THYROIDECTOMY N/A 02/26/2017   Procedure: TOTAL THYROIDECTOMY;  Surgeon: Excell Seltzer, MD;  [facility];  Service: General;  Laterality: N/A;  . TUBAL LIGATION       No outpatient medications have been marked as taking for the 06/04/19 encounter (Appointment) with Sueanne Margarita, MD.     Allergies:   Cortisone, Effexor [venlafaxine], Lexapro [escitalopram], and Xarelto [rivaroxaban]   Social History   Tobacco Use  . Smoking status: Former Smoker    Packs/day: 1.00    Years: 45.00    Pack years: 45.00    Types: Cigarettes    Quit date: 07/11/2014    Years since quitting: 4.8  . Smokeless tobacco: Never Used  Substance Use Topics  . Alcohol use: No    Alcohol/week: 0.0 standard drinks  . Drug use: No     Family Hx: The patient's family hx was reviewed and significant for HTN and heart dz in her mom and DM in her sister  ROS:   Please see the history of present illness.     All other systems reviewed and are negative.   Labs/Other Tests and Data Reviewed:    Recent Labs: 09/09/2018: BUN 11; Creatinine, Ser 0.85; Potassium 3.7; Sodium 138 10/22/2018: Hemoglobin 12.4; Platelets 227  04/14/2019: TSH 0.84   Recent Lipid Panel Lab Results  Component Value Date/Time   CHOL 174 07/04/2018 10:42 AM   TRIG 55 07/04/2018 10:42 AM   HDL 71 07/04/2018 10:42 AM   CHOLHDL 2.5 07/04/2018 10:42 AM   CHOLHDL 2.6 08/09/2016 10:16 AM   LDLCALC 92 07/04/2018 10:42 AM    Wt Readings from Last 3 Encounters:  04/20/19 291 lb (132 kg)  04/06/19 287 lb (130.2 kg)  10/22/18 289 lb (131.1 kg)     Objective:    Vital Signs:  There were no vitals taken for this visit.   CONSTITUTIONAL:  Well nourished, well developed female in no acute distress.  EYES: anicteric MOUTH: oral mucosa is pink RESPIRATORY: Normal respiratory effort, symmetric expansion CARDIOVASCULAR: No peripheral edema SKIN: No rash, lesions or ulcers MUSCULOSKELETAL: no digital cyanosis NEURO: Cranial Nerves II-XII grossly intact, moves all extremities PSYCH: Intact judgement and insight.  A&O x 3, Mood/affect appropriate  ASSESSMENT & PLAN:    1.  OSA - The patient is tolerating PAP therapy well without any problems. The PAP download was reviewed today and showed an AHI of 0.2/hr on 15 cm H2O with 73% compliance in using more than 4 hours nightly.  The patient has been using and benefiting from PAP use and will continue to benefit from therapy.   2.  HTN -BP is controlled -continue HCTZ 25mg  daily  3.  Morbid Obesity  -I have encouraged her to get into a routine exercise program and cut back on carbs and portions.   COVID-19 Education: The signs and symptoms of COVID-19 were discussed with the patient and how to seek care for testing (follow up with PCP or arrange E-visit).  The importance of social distancing was discussed today.  Patient Risk:   After full review of this patient's clinical status, I feel that they are at least moderate risk at this time.  Time:   Today, I have spent 20 minutes directly with the patient on telemedicine discussing medical problems including OSA, HTN, obesity.  We also  reviewed the symptoms of COVID 19 and the ways to protect against contracting the virus with telehealth technology.  I spent an additional 5 minutes reviewing patient's chart including PAP compliance download.  Medication Adjustments/Labs and Tests Ordered: Current medicines are reviewed at length with the patient today.  Concerns regarding medicines are outlined above.  Tests Ordered: No orders of the defined types were placed in this encounter.  Medication Changes: No orders of the defined types were placed in this encounter.   Disposition:  Follow up in 1 year(s)  Signed, Fransico Him, MD  06/03/2019 9:51 AM    [facility]  This encounter was created in error - please disregard.

## 2019-06-04 ENCOUNTER — Encounter: Payer: Medicare Other | Admitting: Cardiology

## 2019-06-04 ENCOUNTER — Other Ambulatory Visit: Payer: Self-pay

## 2019-06-08 NOTE — Progress Notes (Signed)
Virtual Visit via Telephone Note   This visit type was conducted due to national recommendations for restrictions regarding the COVID-19 Pandemic (e.g. social distancing) in an effort to limit this patient's exposure and mitigate transmission in our community.  Due to her co-morbid illnesses, this patient is at least at moderate risk for complications without adequate follow up.  This format is felt to be most appropriate for this patient at this time.  All issues noted in this document were discussed and addressed.  A limited physical exam was performed with this format.  Please refer to the patient's chart for her consent to telehealth for [facility].   Evaluation Performed:  Follow-up visit  This visit type was conducted due to national recommendations for restrictions regarding the COVID-19 Pandemic (e.g. social distancing).  This format is felt to be most appropriate for this patient at this time.  All issues noted in this document were discussed and addressed.  No physical exam was performed (except for noted visual exam findings with Video Visits).  Please refer to the patient's chart (MyChart message for video visits and phone note for telephone visits) for the patient's consent to telehealth for [facility].  Date:  06/09/2019   ID:  Sheila Jacobs, DOB [birth year], MRN [id number]  Patient Location:  Home  Provider location:   [place]  PCP:  [name], FNP  Cardiologist:  Minus Breeding, MD  Sleep Medicine:  Fransico Him, MD Electrophysiologist:  None   Chief Complaint:  OSA  History of Present Illness:    Sheila Jacobs is a 68 y.o. female who presents via audio/video conferencing for a telehealth visit today.    Sheila Jacobs is a 68 y.o. female with a hx of bradycardia, HTN, obesity who was referred by Dr. [name] for sleep study. ESS was 2 but severe snoring and was found  to have severe OSA with an AHI of 30/hr and O2 desats as low as 80%.She  underwent CPAP titration to 15cm H2O.  She is doing well with her CPAP device.  She tolerates the full face mask and feels the pressure is adequate.  Since going on CPAP she feels rested in the am and has no significant daytime sleepiness.  She denies any significant mouth or nasal dryness or nasal congestion.  She does not think that he snores.   She is having problems with the water reservoir not using up the water and it is sitting in the device for several days.    The patient does not have symptoms concerning for COVID-19 infection (fever, chills, cough, or new shortness of breath).    Prior CV studies:   The following studies were reviewed today:  PAP compliance download  Past Medical History:  Diagnosis Date  . Acute saddle pulmonary embolism without acute cor pulmonale ([place]) 12/29/2017  . Anginal pain ([place])   . Arthritis   . Breast cancer of upper-outer quadrant of left female breast ([place]) 01/31/2016  . Colon polyps   . GERD (gastroesophageal reflux disease)   . History of radiation therapy 10/9-11/20/17   Left Breast 45 Gy in 25 fractions, Left Breast Boost 6 Gy in 3 fractions.   . Hyperlipidemia   . Hypertension   . Morbid obesity ([place])   . OSA (obstructive sleep apnea) 01/23/2018  . Palpitations 10/16/2016  . Personal history of noncompliance with medical treatment, presenting hazards to health 08/27/2016  . Thyroid goiter    Past Surgical History:  Procedure Laterality  Date  . BREAST LUMPECTOMY Left 2017   radiation  . BREAST LUMPECTOMY WITH RADIOACTIVE SEED AND SENTINEL LYMPH NODE BIOPSY Left 04/11/2016   Procedure: BREAST LUMPECTOMY WITH RADIOACTIVE SEED AND SENTINEL LYMPH NODE BIOPSY;  Surgeon: Excell Seltzer, MD;  Location: [place];  Service: General;  Laterality: Left;  . COLONOSCOPY WITH PROPOFOL N/A 10/20/2015   Procedure: COLONOSCOPY WITH PROPOFOL;  Surgeon: Mauri Pole, MD;  [facility];  Service: Endoscopy;  Laterality: N/A;  .  ESOPHAGOGASTRODUODENOSCOPY (EGD) WITH PROPOFOL N/A 10/20/2015   Procedure: ESOPHAGOGASTRODUODENOSCOPY (EGD) WITH PROPOFOL;  Surgeon: Mauri Pole, MD;  [facility];  Service: Endoscopy;  Laterality: N/A;  . IR ANGIOGRAM PULMONARY BILATERAL SELECTIVE  12/29/2017  . IR ANGIOGRAM SELECTIVE EACH ADDITIONAL VESSEL  12/29/2017  . IR ANGIOGRAM SELECTIVE EACH ADDITIONAL VESSEL  12/29/2017  . IR INFUSION THROMBOL ARTERIAL INITIAL (MS)  12/29/2017  . IR INFUSION THROMBOL ARTERIAL INITIAL (MS)  12/29/2017  . IR THROMB F/U EVAL ART/VEN FINAL DAY ([place])  12/30/2017  . IR US GUIDE VASC ACCESS RIGHT  12/29/2017  . LEFT HEART CATHETERIZATION WITH CORONARY ANGIOGRAM N/A 10/21/2014   Procedure: LEFT HEART CATHETERIZATION WITH CORONARY ANGIOGRAM;  Surgeon: Burnell Blanks, MD;  Location[facility] CATH LAB;  Service: Cardiovascular;  Laterality: N/A;  . THYROIDECTOMY N/A 02/26/2017   Procedure: TOTAL THYROIDECTOMY;  Surgeon: Excell Seltzer, MD;  [facility];  Service: General;  Laterality: N/A;  . TUBAL LIGATION       No outpatient medications have been marked as taking for the 06/09/19 encounter (Telemedicine) with Sueanne Margarita, MD.     Allergies:   Cortisone, Effexor [venlafaxine], Lexapro [escitalopram], and Xarelto [rivaroxaban]   Social History   Tobacco Use  . Smoking status: Former Smoker    Packs/day: 1.00    Years: 45.00    Pack years: 45.00    Types: Cigarettes    Quit date: 07/11/2014    Years since quitting: 4.9  . Smokeless tobacco: Never Used  Substance Use Topics  . Alcohol use: No    Alcohol/week: 0.0 standard drinks  . Drug use: No     Family Hx: The patient's family history includes Cataracts in her brother and mother; Diabetes in her mother and sister; Hearing loss in her brother and mother; Heart disease in her mother; Hypertension in her mother; Kidney disease in her mother; Liver disease in her mother. There is no history of Colon cancer or Thyroid disease.   ROS:   Please see the history of present illness.     All other systems reviewed and are negative.   Labs/Other Tests and Data Reviewed:    Recent Labs: 09/09/2018: BUN 11; Creatinine, Ser 0.85; Potassium 3.7; Sodium 138 10/22/2018: Hemoglobin 12.4; Platelets 227 04/14/2019: TSH 0.84   Recent Lipid Panel Lab Results  Component Value Date/Time   CHOL 174 07/04/2018 10:42 AM   TRIG 55 07/04/2018 10:42 AM   HDL 71 07/04/2018 10:42 AM   CHOLHDL 2.5 07/04/2018 10:42 AM   CHOLHDL 2.6 08/09/2016 10:16 AM   LDLCALC 92 07/04/2018 10:42 AM    Wt Readings from Last 3 Encounters:  06/09/19 298 lb (135.2 kg)  04/20/19 291 lb (132 kg)  04/06/19 287 lb (130.2 kg)     Objective:    Vital Signs:  BP 135/60   Ht 5\' 3"  (1.6 m)   Wt 298 lb (135.2 kg)   BMI 52.79 kg/m     ASSESSMENT & PLAN:    1.  OSA -  The patient is tolerating PAP therapy well without any problems. The PAP download was reviewed today and showed an AHI of 0.2/hr on 15 cm H2O with 73% compliance in using more than 4 hours nightly.  The patient has been using and benefiting from PAP use and will continue to benefit from therapy. I encouraged her to go to her DME to have them check her H2O reservoir.  2.  HTN -BP controlled -continue diuretic  3.  Morbid Obesity  -I have encouraged her continue exercising and cut back on carbs and portions.    COVID-19 Education: The signs and symptoms of COVID-19 were discussed with the patient and how to seek care for testing (follow up with PCP or arrange E-visit).  The importance of social distancing was discussed today.  Patient Risk:   After full review of this patient's clinical status, I feel that they are at least moderate risk at this time.  Time:   Today, I have spent 20 minutes directly with the patient on telemedicine discussing medical problems including OSA, HTN, obesity.  We also reviewed the symptoms of COVID 19 and the ways to protect against contracting the virus  with telehealth technology.  I spent an additional 5 minutes reviewing patient's chart including PAP compliance download.  Medication Adjustments/Labs and Tests Ordered: Current medicines are reviewed at length with the patient today.  Concerns regarding medicines are outlined above.  Tests Ordered: No orders of the defined types were placed in this encounter.  Medication Changes: No orders of the defined types were placed in this encounter.   Disposition:  Follow up in 1 year(s)  Signed, Fransico Him, MD  06/09/2019 7:56 AM    [facility]

## 2019-06-09 ENCOUNTER — Other Ambulatory Visit: Payer: Self-pay

## 2019-06-09 ENCOUNTER — Telehealth (INDEPENDENT_AMBULATORY_CARE_PROVIDER_SITE_OTHER): Payer: Medicare Other | Admitting: Cardiology

## 2019-06-09 VITALS — BP 135/60 | Ht 63.0 in | Wt 298.0 lb

## 2019-06-09 DIAGNOSIS — I10 Essential (primary) hypertension: Secondary | ICD-10-CM

## 2019-06-09 DIAGNOSIS — G4730 Sleep apnea, unspecified: Secondary | ICD-10-CM | POA: Diagnosis not present

## 2019-06-09 NOTE — Patient Instructions (Signed)
Medication Instructions:  Your physician recommends that you continue on your current medications as directed. Please refer to the Current Medication list given to you today.  If you need a refill on your cardiac medications before your next appointment, please call your pharmacy.   Lab work: None If you have labs (blood work) drawn today and your tests are completely normal, you will receive your results only by: Marland Kitchen MyChart Message (if you have MyChart) OR . A paper copy in the mail If you have any lab test that is abnormal or we need to change your treatment, we will call you to review the results.  Testing/Procedures: None  Follow-Up: At [facility], you and your health needs are our priority.  As part of our continuing mission to provide you with exceptional heart care, we have created designated Provider Care Teams.  These Care Teams include your primary Cardiologist (physician) and Advanced Practice Providers (APPs -  Physician Assistants and Nurse Practitioners) who all work together to provide you with the care you need, when you need it. You will need a follow up appointment in 12 months.  Please call our office 2 months in advance to schedule this appointment.  You may see Dr. Fransico Him or one of the following Advanced Practice Providers on your designated Care Team:   [place], PA-C Melina Copa, PA-C . Ermalinda Barrios, PA-C  Any Other Special Instructions Will Be Listed Below (If Applicable).

## 2019-06-11 ENCOUNTER — Encounter: Payer: Self-pay | Admitting: Physical Therapy

## 2019-06-11 ENCOUNTER — Ambulatory Visit: Payer: Medicare Other | Attending: Family | Admitting: Physical Therapy

## 2019-06-11 DIAGNOSIS — I890 Lymphedema, not elsewhere classified: Secondary | ICD-10-CM | POA: Diagnosis present

## 2019-06-11 NOTE — Therapy (Signed)
[place], [place], [address] Phone: [phone]   Fax:  [phone]  Physical Therapy Treatment  Patient Details  Name: Sheila Jacobs MRN: [id number] Date of Birth: 1951/01/13 Referring Provider (PT): Novella Rob, FNP   Encounter Date: 06/11/2019  PT End of Session - 06/11/19 1351    Visit Number  2    Number of Visits  13    Date for PT Re-Evaluation  06/17/19    PT Start Time  [id number]   pt arrived late   PT Stop Time  1345    PT Time Calculation (min)  23 min       Past Medical History:  Diagnosis Date  . Acute saddle pulmonary embolism without acute cor pulmonale ([place]) 12/29/2017  . Anginal pain ([place])   . Arthritis   . Breast cancer of upper-outer quadrant of left female breast ([place]) 01/31/2016  . Colon polyps   . GERD (gastroesophageal reflux disease)   . History of radiation therapy 10/9-11/20/17   Left Breast 45 Gy in 25 fractions, Left Breast Boost 6 Gy in 3 fractions.   . Hyperlipidemia   . Hypertension   . Morbid obesity ([place])   . OSA (obstructive sleep apnea) 01/23/2018  . Palpitations 10/16/2016  . Personal history of noncompliance with medical treatment, presenting hazards to health 08/27/2016  . Thyroid goiter     Past Surgical History:  Procedure Laterality Date  . BREAST LUMPECTOMY Left 2017   radiation  . BREAST LUMPECTOMY WITH RADIOACTIVE SEED AND SENTINEL LYMPH NODE BIOPSY Left 04/11/2016   Procedure: BREAST LUMPECTOMY WITH RADIOACTIVE SEED AND SENTINEL LYMPH NODE BIOPSY;  Surgeon: Excell Seltzer, MD;  Location: [facility];  Service: General;  Laterality: Left;  . COLONOSCOPY WITH PROPOFOL N/A 10/20/2015   Procedure: COLONOSCOPY WITH PROPOFOL;  Surgeon: Mauri Pole, MD;  [facility];  Service: Endoscopy;  Laterality: N/A;  . ESOPHAGOGASTRODUODENOSCOPY (EGD) WITH PROPOFOL N/A 10/20/2015   Procedure: ESOPHAGOGASTRODUODENOSCOPY (EGD) WITH PROPOFOL;  Surgeon: Mauri Pole, MD;  [facility];  Service: Endoscopy;  Laterality: N/A;  . IR ANGIOGRAM PULMONARY BILATERAL SELECTIVE  12/29/2017  . IR ANGIOGRAM SELECTIVE EACH ADDITIONAL VESSEL  12/29/2017  . IR ANGIOGRAM SELECTIVE EACH ADDITIONAL VESSEL  12/29/2017  . IR INFUSION THROMBOL ARTERIAL INITIAL (MS)  12/29/2017  . IR INFUSION THROMBOL ARTERIAL INITIAL (MS)  12/29/2017  . IR THROMB F/U EVAL ART/VEN FINAL DAY (MS)  12/30/2017  . IR US GUIDE VASC ACCESS RIGHT  12/29/2017  . LEFT HEART CATHETERIZATION WITH CORONARY ANGIOGRAM N/A 10/21/2014   Procedure: LEFT HEART CATHETERIZATION WITH CORONARY ANGIOGRAM;  Surgeon: Burnell Blanks, MD;  Location: [facility] C[facility] LAB;  Service: Cardiovascular;  Laterality: N/A;  . THYROIDECTOMY N/A 02/26/2017   Procedure: TOTAL THYROIDECTOMY;  Surgeon: Excell Seltzer, MD;  [facility];  Service: General;  Laterality: N/A;  . TUBAL LIGATION      There were no vitals filed for this visit.  Subjective Assessment - 06/11/19 1324    Subjective  My legs are the same as they were when I was here last time.    Pertinent History  04/11/16 Lt lumpectomy by Dr. Excell Seltzer DCIS 0/8 nodes negative.  Radiation completed and taking letrozole.  History of saddle pulmonary embolism 12/29/17 with lifetime anticoagulation (Eliquis). Good heart and kidney function per chart.  Other history HTN, knee OA, chronic smoker, and thyroidectomy 02/25/18.    Patient Stated Goals  decrease the swelling    Currently  in Pain?  No/denies    Pain Score  0-No pain                  Outpatient Rehab from 04/22/2019 in [place]  Lymphedema Life Impact Scale Total Score  20.59 %           [facility] Adult PT Treatment/Exercise - 06/11/19 0001      Manual Therapy   Manual Therapy  Edema management    Edema Management  spent time re educating patient about need for garments, how to get measured for garments, insurance coverage for garments, stockings vs  velcro garments and need for more containment and course of therapy- signed pt up to be measured on Oct 12             PT Education - 06/11/19 1349    Education Details  difference between stockings and Circaids, course of complete CDT, needing to use compression 24/7    Person(s) Educated  Patient    Methods  Explanation    Comprehension  Verbalized understanding          PT Long Term Goals - 06/11/19 1349      PT LONG TERM GOAL #1   Title  Pt will be independent with self compression and lymphedema care at home    Time  6    Period  Weeks    Status  On-going    Target Date  07/23/19      PT LONG TERM GOAL #2   Title  Pt will receive appropriate compression garments for long term management of lymphedema.    Time  6    Period  Weeks    Status  New    Target Date  07/23/19      PT LONG TERM GOAL #3   Title  Pt will decrease lower leg circumference by at least 2 cm    Baseline  Rt: 37, 52, 62.3   Lt: 33.5, 46.7, 59    Time  6    Period  Weeks    Status  On-going            Plan - 06/11/19 1351    Clinical Impression Statement  Pt returns to PT after not being seen since 04/22/19. She did not obtain compression garments because Medicare did not cover them. She is unable to attend PT 3x/wk due to keeping her grandkids and transportation issues. She would like to be measured for her garments on Oct 12 by Melissa with [employer]. Will follow up with pt in mid November to give her time to receive garments and then assess for fit and to be sure that pt can manage lymphedema indpendently with these.    Rehab Potential  Fair    PT Frequency  Monthy    PT Duration  6 weeks    PT Treatment/Interventions  ADLs/Self Care Home Management;Manual lymph drainage;Patient/family education;Compression bandaging;Therapeutic exercise;Taping;Gait training    PT Next Visit Plan  fit circaids to the LE with instruction on use of these, if pt agreeable could advise MLD in clinic with  circaids or self MLD instruction, also general exercise and mobility    Consulted and Agree with Plan of Care  Patient       Patient will benefit from skilled therapeutic intervention in order to improve the following deficits and impairments:  Abnormal gait, Increased edema, Difficulty walking  Visit Diagnosis: Lymphedema, not elsewhere classified     Problem List Patient Active  Problem List   Diagnosis Date Noted  . Medication management 10/22/2018  . History of pulmonary embolus (PE), 12/2017 10/22/2018  . Chronic anticoagulation 10/22/2018  . Tinea corporis 08/22/2018  . Right leg swelling 08/21/2018  . Pain and swelling of ankle, right 04/24/2018  . OSA (obstructive sleep apnea) 01/23/2018  . Hypothyroidism   . Hypoxia   . Acute saddle pulmonary embolism without acute cor pulmonale ([place]) 12/29/2017  . History of breast cancer 10/23/2017  . Post-surgical hypothyroidism 06/27/2017  . Thyroid goiter 02/26/2017  . Personal history of noncompliance with medical treatment, presenting hazards to health 08/27/2016  . Generalized anxiety disorder 08/09/2016  . Abnormal TSH 05/09/2016  . Abnormality of gait 01/31/2016  . Low back pain 01/31/2016  . Breast cancer of upper-outer quadrant of left female breast ([place]) 01/31/2016  . Former heavy cigarette smoker (20-39 per day) 11/08/2015  . Esophageal reflux   . GERD (gastroesophageal reflux disease) 05/03/2015  . Dependent edema 05/03/2015  . Morbid obesity ([place]) 04/29/2015  . Right carotid bruit 10/06/2014  . Atypical chest pain 09/18/2014  . Hypertension 09/18/2014  . Hyperlipidemia 09/18/2014  . Enlarged LA (left atrium)     Allyson Sabal Blue 06/11/2019, 2:02 PM  [place], [place], [address] Phone: [phone]   Fax:  [phone]  Name: Sheila Jacobs MRN: [id number] Date of Birth: 27-Sep-1950  Manus Gunning, PT 06/11/19  2:02 PM

## 2019-06-15 ENCOUNTER — Other Ambulatory Visit: Payer: Self-pay | Admitting: Adult Health

## 2019-06-15 DIAGNOSIS — Z9889 Other specified postprocedural states: Secondary | ICD-10-CM

## 2019-06-25 ENCOUNTER — Other Ambulatory Visit: Payer: Self-pay | Admitting: *Deleted

## 2019-06-25 NOTE — Patient Outreach (Signed)
Outreach call to pt for telephone assessment, no answer to telephone and no option to leave voicemail,  RN CM mailed unsuccessful outreach letter to patient's home.  PLAN Outreach pt in 3-4 business days  Jacqlyn Larsen [facility], [place] [phone]

## 2019-06-30 ENCOUNTER — Encounter: Payer: Self-pay | Admitting: *Deleted

## 2019-06-30 ENCOUNTER — Other Ambulatory Visit: Payer: Self-pay | Admitting: *Deleted

## 2019-06-30 NOTE — Patient Outreach (Signed)
Outreach call to pt for telephone assessment, spoke with pt, HIPAA verified, pt reports she did receive BP cuff and is checking daily and recording, she states " readings are around 135/78 and in that range"  Pt states " I really am trying to lay off the salt"  Pt to see primary care on 07/23/19, pt was measured for compression hose on 06/11/19 and states she is awaiting approval from insurance.  No further or new needs identified.  RN CM faxed case closure letter to primary care MD and mailed case closure letter to pt home.  THN CM Care Plan Problem One     Most Recent Value  Care Plan Problem One  Knowledge deficit related to hypertension  Role Documenting the Problem One  Care Management Coordinator  Care Plan for Problem One  Active  THN Long Term Goal   Pt will verbalize improved self care related to overall health/ hypertension within 60 days  THN Long Term Goal Start Date  04/06/19  [facility] Long Term Goal Met Date  06/30/19  Interventions for Problem One Long Term Goal  RN CM reviewed discharge plan with pt and will close case today, pt received BP cuff and is checking daily and recording,  RN CM ask pt to take log to next primary care MD appointment in November.  THN CM Short Term Goal #1   Pt will verbalize low sodium diet and choose foods low in sodium within 30 days  THN CM Short Term Goal #1 Start Date  04/27/19 [goal re-established]  THN CM Short Term Goal #1 Met Date  06/30/19  Interventions for Short Term Goal #1  RN CM reinforced low sodium foods and healthy food choices, ask pt to refer to her low sodium poster  THN CM Short Term Goal #2   Pt will have BP cuff and start monitoring BP several times weekly within 30 days  THN CM Short Term Goal #2 Start Date  04/27/19 [goal re-established]  THN CM Short Term Goal #2 Met Date  06/30/19  Interventions for Short Term Goal #2  RN CM praised pt for checking BP daily and recording, pt states most readings are within normal limits       PLAN Close case today  Jacqlyn Larsen [facility], BSN [place] Coordinator [phone]

## 2019-07-13 ENCOUNTER — Other Ambulatory Visit: Payer: Medicare Other

## 2019-07-15 ENCOUNTER — Other Ambulatory Visit: Payer: Self-pay | Admitting: Endocrinology

## 2019-07-16 ENCOUNTER — Ambulatory Visit: Payer: Medicare Other | Admitting: Endocrinology

## 2019-07-24 ENCOUNTER — Ambulatory Visit: Payer: Medicare Other | Admitting: Physical Therapy

## 2019-07-27 ENCOUNTER — Ambulatory Visit
Admission: RE | Admit: 2019-07-27 | Discharge: 2019-07-27 | Disposition: A | Discharge status: Home / Self Care | Payer: Medicare Other | Source: Ambulatory Visit | Attending: Adult Health | Admitting: Adult Health

## 2019-07-27 ENCOUNTER — Other Ambulatory Visit: Payer: Self-pay

## 2019-07-27 DIAGNOSIS — Z9889 Other specified postprocedural states: Secondary | ICD-10-CM

## 2019-07-27 HISTORY — DX: Personal history of irradiation: Z92.3

## 2019-07-27 IMAGING — MG DIGITAL DIAGNOSTIC BILAT W/ TOMO W/ CAD
8 of 17 series · 8 of 40 positions shown · non-contrast
Comparison: Previous exam(s).

ACR Breast Density Category a: The breast tissue is almost entirely
fatty.

CLINICAL DATA: 68-year-old female for annual follow-up. History of
LEFT breast cancer and lumpectomy in [[place]].

EXAM:
DIGITAL DIAGNOSTIC BILATERAL MAMMOGRAM WITH CAD AND TOMO

[L CC]
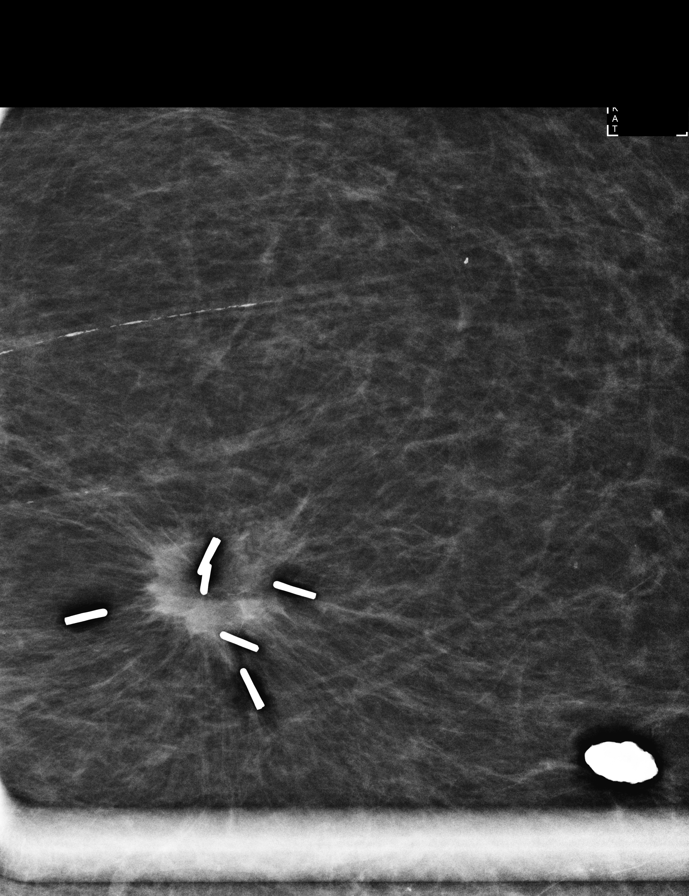

[L MLO synth-2D]
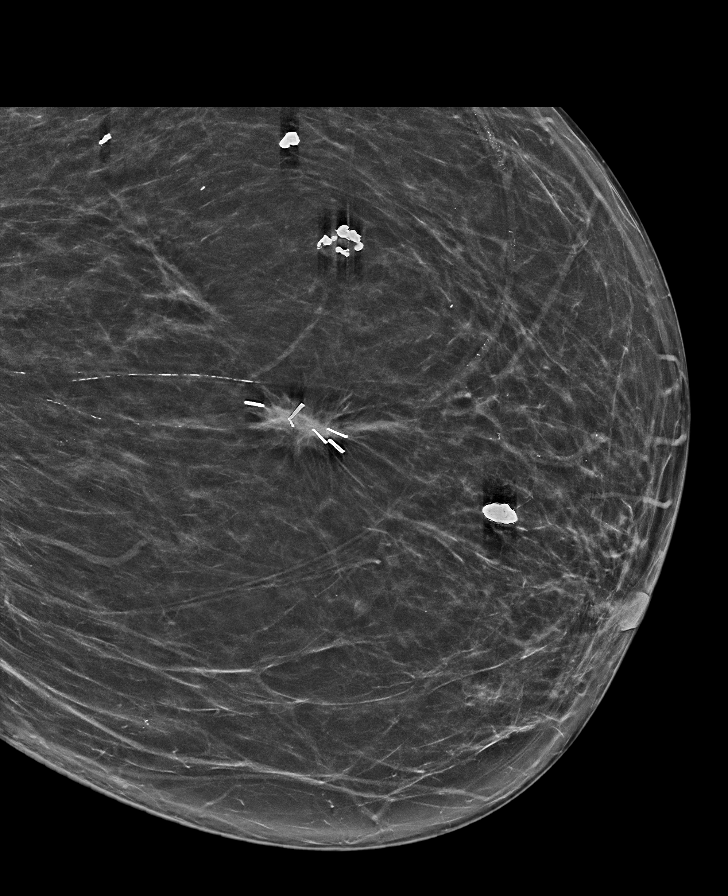

[R MLO synth-2D (1 of 2)]
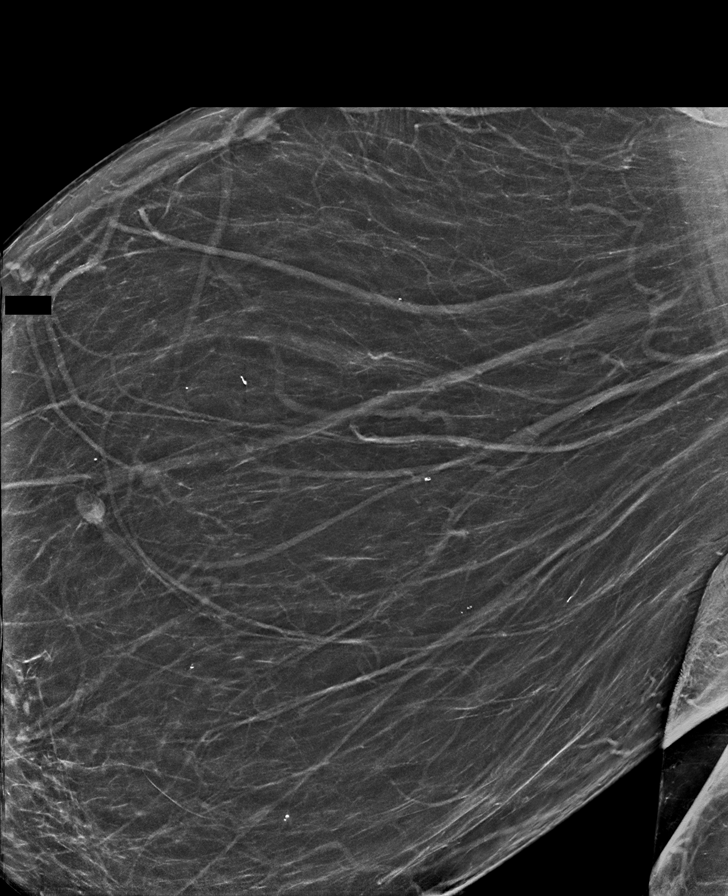

[R CC synth-2D]
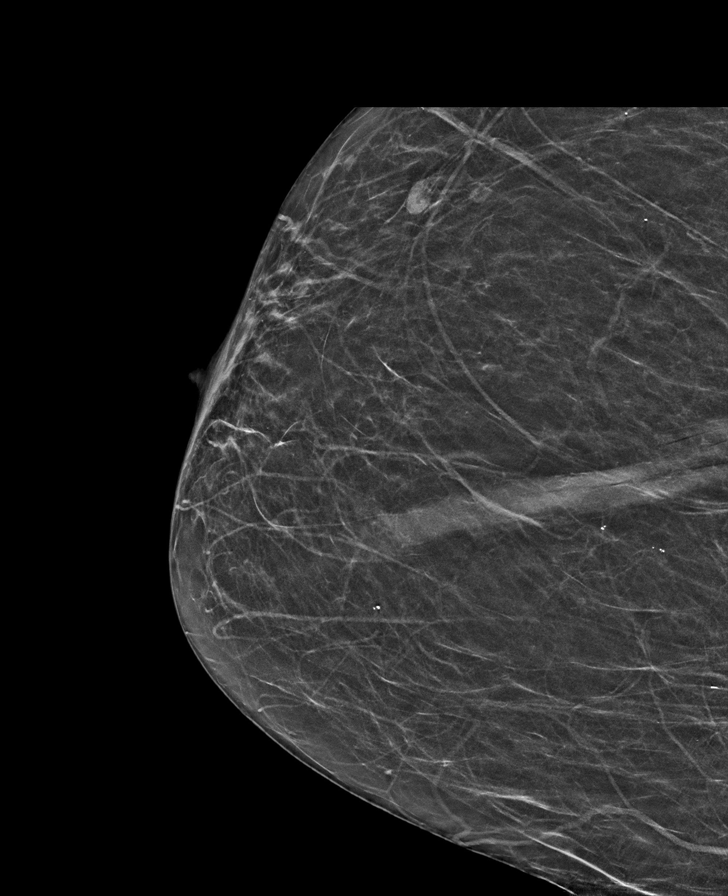

[R MLO synth-2D (2 of 2)]
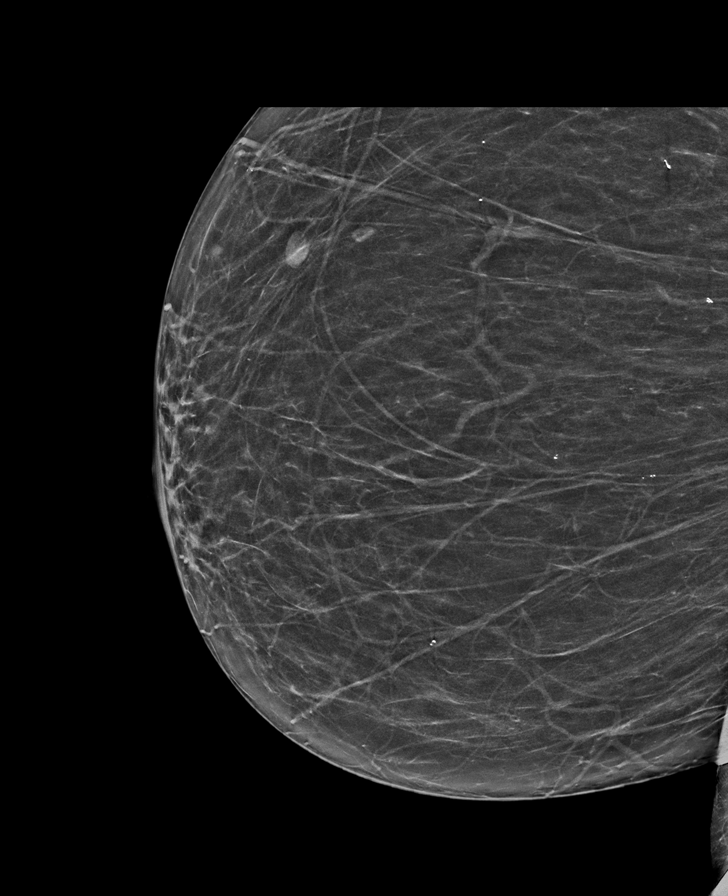

[L CC synth-2D (1 of 2)]
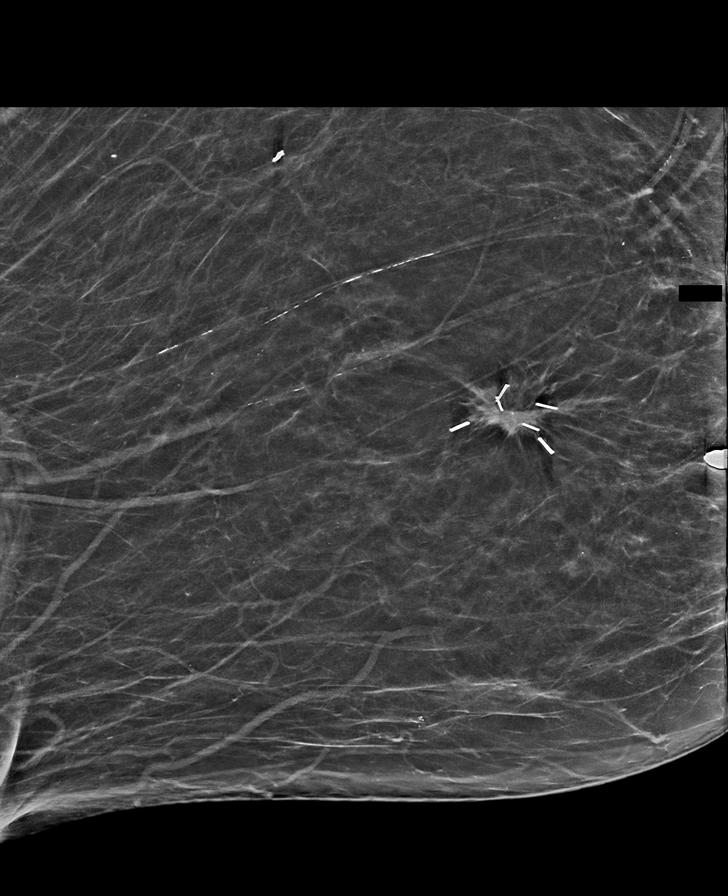

[L CC synth-2D (2 of 2)]
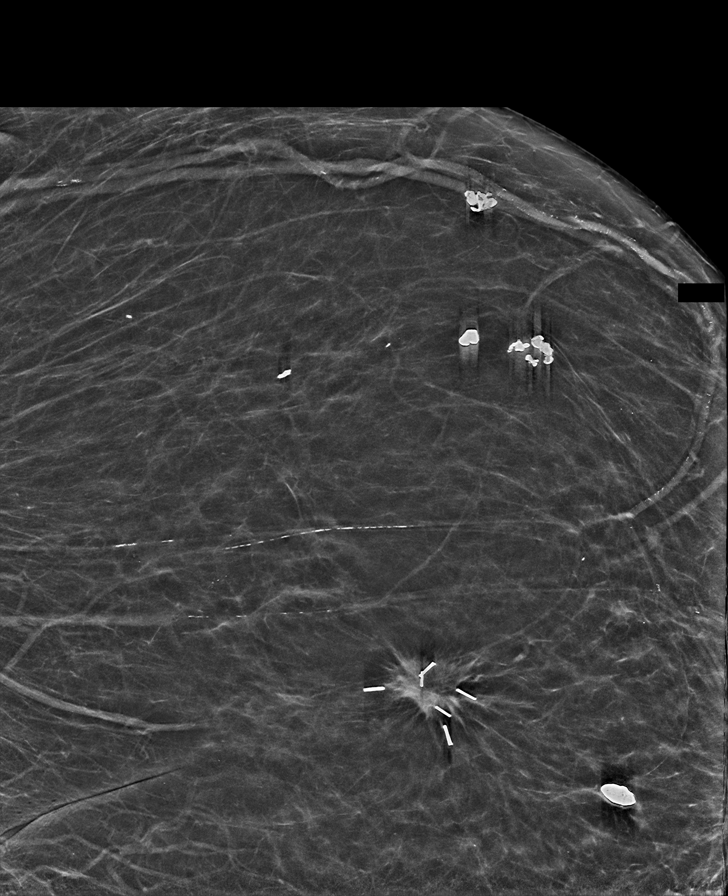

[L CC tomo · tomo slice 53/78.0]
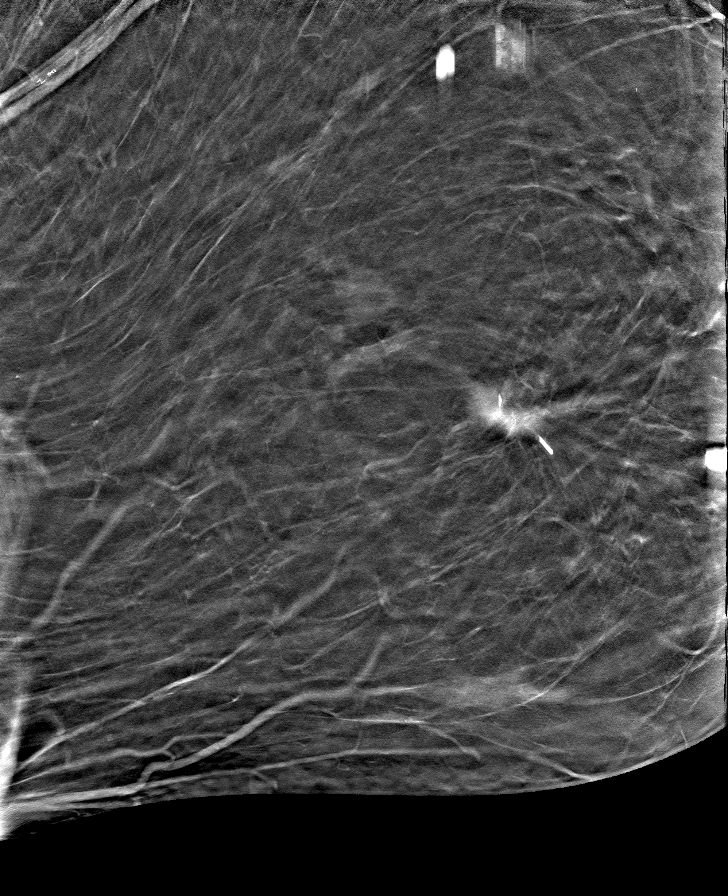

[8 of 40 positions shown; findings below may reference images not displayed]

FINDINGS: 2D and 3D full field views of both breasts and a magnification view
of the lumpectomy site demonstrate no suspicious mass, nonsurgical
distortion or worrisome calcifications.

LEFT lumpectomy changes again noted.

Mammographic images were processed with CAD.
IMPRESSION: No mammographic evidence of breast malignancy.

RECOMMENDATION:
.  Bilateral diagnostic mammogram in 1 year

I have discussed the findings and recommendations with the patient.
If applicable, a reminder letter will be sent to the patient
regarding the next appointment.

BI-RADS CATEGORY  2: Benign.

## 2019-07-31 ENCOUNTER — Other Ambulatory Visit: Payer: Self-pay

## 2019-07-31 ENCOUNTER — Ambulatory Visit: Payer: Medicare Other | Attending: Family

## 2019-07-31 DIAGNOSIS — R2689 Other abnormalities of gait and mobility: Secondary | ICD-10-CM

## 2019-07-31 DIAGNOSIS — I890 Lymphedema, not elsewhere classified: Secondary | ICD-10-CM | POA: Diagnosis present

## 2019-07-31 NOTE — Patient Instructions (Addendum)
Deep Effective Breath   Standing, sitting, or laying down place both hands on the belly. Take a deep breath IN, expanding the belly; then breath OUT, contracting the belly. Repeat __5__ times. Do __2-3__ sessions per day and before each self massage.  http://gt2.exer.us/866   Copyright  VHI. All rights reserved.  Inguinal Nodes to Axilla - Clear   On both sides, at armpit, make _5__ in-place circles. Then from hip proceed in sections to armpit with stationary circles or pumps _5_ times, this is your pathway. Do _1__ time per day.  Copyright  VHI. All rights reserved.  LEG: Knee to Hip - Clear    Pump up outer thigh of involved leg from knee to outer hip. Then do stationary circles from inner to outer thigh, then do outer thigh again. Next, interlace fingers behind knee IF ABLE and make in-place circles. Do _5_ times of each sequence.  Do _1__ time per day.  Copyright  VHI. All rights reserved.  You Can Stop Here :) LEG: Ankle to Hip Sweep   Hands on sides of ankle of involved leg, pump _5__ times up both sides of lower leg, then retrace steps up outer thigh to hip as before and back to pathway. Do _2-3_ times. Do __1_ time per day.  Copyright  VHI. All rights reserved.  FOOT: Dorsum of Foot and Toes Massage   One hand on top of foot make _5_ stationary circles or pumps, then either on top of toes or each individual toe do _5_ pumps. Then retrace all steps pumping back up both sides of lower leg, outer thigh, and then pathway. Finish with what you started with, _5_ circles at involved side arm pit. All _2-3_ times at each sequence. Do _1__ time per day.  Copyright  VHI. All rights reserved.   Access Code: [email or web address]  URL: [email or web address]  Date: 07/31/2019  Prepared by: Tomma Rakers   Exercises Standing Upper Cervical Flexion and Extension - 20 reps - 1 sets - 1x daily - 7x weekly Standing Cervical Sidebending AROM - 20 reps - 1 sets - 1x daily -  7x weekly Standing Cervical Rotation AROM - 20 reps - 1 sets - 1x daily - 7x weekly Standing Shoulder Flexion Full Range - 20 reps - 1 sets - 1x daily - 7x weekly Standing Shoulder Shrugs - 20 reps - 1 sets - 1x daily - 7x weekly Standing Scapular Retraction - 20 reps - 1 sets - 1x daily - 7x weekly Trunk Sidebending with Compression Garment - 20 reps - 1 sets - 1x daily - 7x weekly Standing Hip Extension with Counter Support - 20 reps - 1 sets - 1x daily - 7x weekly Seated Knee Flexion Extension AROM - 20 reps - 1 sets - 1x daily - 7x weekly Heel Toe Raises with Counter Support - 20 reps - 1 sets - 1x daily - 7x weekly

## 2019-07-31 NOTE — Therapy (Signed)
[place], [place], [address] Phone: [phone]   Fax:  [phone]  Physical Therapy Treatment  Patient Details  Name: Sheila Jacobs MRN: [id number] Date of Birth: 14-Nov-1950 Referring Provider (PT): Novella Rob, FNP   Encounter Date: 07/31/2019  PT End of Session - 07/31/19 1112    Visit Number  3    Number of Visits  13    Date for PT Re-Evaluation  06/17/19    PT Start Time  1105    PT Stop Time  1158    PT Time Calculation (min)  53 min    Activity Tolerance  Patient tolerated treatment well    Behavior During Therapy  [facility] for tasks assessed/performed       Past Medical History:  Diagnosis Date  . Acute saddle pulmonary embolism without acute cor pulmonale ([place]) 12/29/2017  . Anginal pain ([place])   . Arthritis   . Breast cancer of upper-outer quadrant of left female breast ([place]) 01/31/2016  . Colon polyps   . GERD (gastroesophageal reflux disease)   . History of radiation therapy 10/9-11/20/17   Left Breast 45 Gy in 25 fractions, Left Breast Boost 6 Gy in 3 fractions.   . Hyperlipidemia   . Hypertension   . Morbid obesity ([place])   . OSA (obstructive sleep apnea) 01/23/2018  . Palpitations 10/16/2016  . Personal history of noncompliance with medical treatment, presenting hazards to health 08/27/2016  . Personal history of radiation therapy   . Thyroid goiter     Past Surgical History:  Procedure Laterality Date  . BREAST BIOPSY Left 01/2016  . BREAST LUMPECTOMY Left 04/2016  . BREAST LUMPECTOMY WITH RADIOACTIVE SEED AND SENTINEL LYMPH NODE BIOPSY Left 04/11/2016   Procedure: BREAST LUMPECTOMY WITH RADIOACTIVE SEED AND SENTINEL LYMPH NODE BIOPSY;  Surgeon: Excell Seltzer, MD;  Location: [place];  Service: General;  Laterality: Left;  . COLONOSCOPY WITH PROPOFOL N/A 10/20/2015   Procedure: COLONOSCOPY WITH PROPOFOL;  Surgeon: Mauri Pole, MD;  [facility];  Service:  Endoscopy;  Laterality: N/A;  . ESOPHAGOGASTRODUODENOSCOPY (EGD) WITH PROPOFOL N/A 10/20/2015   Procedure: ESOPHAGOGASTRODUODENOSCOPY (EGD) WITH PROPOFOL;  Surgeon: Mauri Pole, MD;  [facility];  Service: Endoscopy;  Laterality: N/A;  . IR ANGIOGRAM PULMONARY BILATERAL SELECTIVE  12/29/2017  . IR ANGIOGRAM SELECTIVE EACH ADDITIONAL VESSEL  12/29/2017  . IR ANGIOGRAM SELECTIVE EACH ADDITIONAL VESSEL  12/29/2017  . IR INFUSION THROMBOL ARTERIAL INITIAL (MS)  12/29/2017  . IR INFUSION THROMBOL ARTERIAL INITIAL (MS)  12/29/2017  . IR THROMB F/U EVAL ART/VEN FINAL DAY (MS)  12/30/2017  . IR US GUIDE VASC ACCESS RIGHT  12/29/2017  . LEFT HEART CATHETERIZATION WITH CORONARY ANGIOGRAM N/A 10/21/2014   Procedure: LEFT HEART CATHETERIZATION WITH CORONARY ANGIOGRAM;  Surgeon: Burnell Blanks, MD;  Location[facility];  Service: Cardiovascular;  Laterality: N/A;  . THYROIDECTOMY N/A 02/26/2017   Procedure: TOTAL THYROIDECTOMY;  Surgeon: Excell Seltzer, MD;  [facility];  Service: General;  Laterality: N/A;  . TUBAL LIGATION      There were no vitals filed for this visit.  Subjective Assessment - 07/31/19 1113    Subjective  Pt reports that she has no pain. She states she brought her wraps and her medication sheet. She has not yet received her Circaid.    Pertinent History  04/11/16 Lt lumpectomy by Dr. Excell Seltzer DCIS 0/8 nodes negative.  Radiation completed and taking letrozole.  History of saddle pulmonary  embolism 12/29/17 with lifetime anticoagulation (Eliquis). Good heart and kidney function per chart.  Other history HTN, knee OA, chronic smoker, and thyroidectomy 02/25/18.    Limitations  Walking    Patient Stated Goals  decrease the swelling    Currently in Pain?  No/denies    Pain Score  0-No pain                  Outpatient Rehab from 04/22/2019 in [place]  Lymphedema Life Impact Scale Total Score  20.59 %            [facility] Adult PT Treatment/Exercise - 07/31/19 0001      Exercises   Exercises  --    Other Exercises   Pt performed full LE lymphatic facilitation exercises for 10 repetitions each with demonstration and VC to avoid pain. Cervical flexion/extension, Cervical R/L rotaiton, Cervical R/L side bending, Bil shoulder flexion, Bil shoulder shrug, Bil scapular retraction, Bil trunk side bending, Standing hip extension w/counter support 10x Bil, Seated R knee extension/flexion, Seated R ankle pumps      Manual Therapy   Manual Therapy  Manual Lymphatic Drainage (MLD)    Manual Lymphatic Drainage (MLD)  Pt was educated through each step with instruction for hand placement, hand positioning, which hand to use for best results, skin stretch, pressure and direction. In supine: 5 deep diaphragmatic breathes, Bil axillary nodes, Bil inguinal nodes, R inguino-axillary anastomosis, attempted in R thigh in supine but pt was unable to perform adequately modified to seated position with improved results for lateral R thigh, Medial to lateral R thigh, posterior knee (stopped here due to pt had difficulty reaching further), re-working all surfaces followed by deep breathes in supine.              PT Education - 07/31/19 1217    Education Details  Access Code: [email or web address], Pt was educated on LE lymphatic faclitation exercises and on self MLD which was modified due to pt difficulty performing in supine with focus on correct hand positioning (including which hand to use), direction, pressure and skin stretch. Using hand over hand placement and demonstration with VC.    Person(s) Educated  Patient    Methods  Explanation;Demonstration;Handout;Verbal cues;Tactile cues    Comprehension  Verbalized understanding;Returned demonstration          PT Long Term Goals - 06/11/19 1349      PT LONG TERM GOAL #1   Title  Pt will be independent with self compression and lymphedema care at home    Time  6     Period  Weeks    Status  On-going    Target Date  07/23/19      PT LONG TERM GOAL #2   Title  Pt will receive appropriate compression garments for long term management of lymphedema.    Time  6    Period  Weeks    Status  New    Target Date  07/23/19      PT LONG TERM GOAL #3   Title  Pt will decrease lower leg circumference by at least 2 cm    Baseline  Rt: 37, 52, 62.3   Lt: 33.5, 46.7, 59    Time  6    Period  Weeks    Status  On-going            Plan - 07/31/19 1112    Clinical Impression Statement  Pt returns to physical therapy after 1 month.  She still has not received her Circaid. She was provided with information to call and company was contacted for pt order. Pt was educated on lymphatic facilitation exercises with good demonstration and on MLD requiring extra time for correct movement and pressure. Pt will call when she receives her garment to set up another appointment. Pt will benefit from continued POC at this time for at least one more visit to make sure that she is able to maintain and manage her lymphedema independently.    Personal Factors and Comorbidities  Time since onset of injury/illness/exacerbation;Behavior Pattern;Past/Current Experience    Examination-Activity Limitations  Stand;Locomotion Level;Stairs    Examination-Participation Restrictions  Cleaning;Meal Prep;Community Activity    Rehab Potential  Fair    PT Frequency  Monthy    PT Duration  6 weeks    PT Treatment/Interventions  ADLs/Self Care Home Management;Manual lymph drainage;Patient/family education;Compression bandaging;Therapeutic exercise;Taping;Gait training    PT Next Visit Plan  fit circaids to the LE with instruction on use of these, if pt agreeable could advise MLD in clinic with circaids or self MLD instruction, also general exercise and mobility    PT Home Exercise Plan  Access Code: [email or web address]    Consulted and Agree with Plan of Care  Patient       Patient will benefit from skilled  therapeutic intervention in order to improve the following deficits and impairments:  Abnormal gait, Increased edema, Difficulty walking  Visit Diagnosis: Lymphedema, not elsewhere classified  Other abnormalities of gait and mobility     Problem List Patient Active Problem List   Diagnosis Date Noted  . Medication management 10/22/2018  . History of pulmonary embolus (PE), 12/2017 10/22/2018  . Chronic anticoagulation 10/22/2018  . Tinea corporis 08/22/2018  . Right leg swelling 08/21/2018  . Pain and swelling of ankle, right 04/24/2018  . OSA (obstructive sleep apnea) 01/23/2018  . Hypothyroidism   . Hypoxia   . Acute saddle pulmonary embolism without acute cor pulmonale ([place]) 12/29/2017  . History of breast cancer 10/23/2017  . Post-surgical hypothyroidism 06/27/2017  . Thyroid goiter 02/26/2017  . Personal history of noncompliance with medical treatment, presenting hazards to health 08/27/2016  . Generalized anxiety disorder 08/09/2016  . Abnormal TSH 05/09/2016  . Abnormality of gait 01/31/2016  . Low back pain 01/31/2016  . Breast cancer of upper-outer quadrant of left female breast ([place]) 01/31/2016  . Former heavy cigarette smoker (20-39 per day) 11/08/2015  . Esophageal reflux   . GERD (gastroesophageal reflux disease) 05/03/2015  . Dependent edema 05/03/2015  . Morbid obesity ([place]) 04/29/2015  . Right carotid bruit 10/06/2014  . Atypical chest pain 09/18/2014  . Hypertension 09/18/2014  . Hyperlipidemia 09/18/2014  . Enlarged LA (left atrium)     Ander Purpura , PT 07/31/2019, 12:22 PM  [place], [place], [address] Phone: [phone]   Fax:  [phone]  Name: Sheila Jacobs MRN: [id number] Date of Birth: [birth year]

## 2019-08-18 ENCOUNTER — Other Ambulatory Visit: Payer: Self-pay

## 2019-08-18 ENCOUNTER — Other Ambulatory Visit (INDEPENDENT_AMBULATORY_CARE_PROVIDER_SITE_OTHER): Payer: Medicare Other

## 2019-08-18 DIAGNOSIS — E890 Postprocedural hypothyroidism: Secondary | ICD-10-CM | POA: Diagnosis not present

## 2019-08-18 LAB — TSH: TSH: 0.73 u[IU]/mL (ref 0.35–4.50)

## 2019-08-18 LAB — T4, FREE: Free T4: 1.54 ng/dL (ref 0.60–1.60)

## 2019-08-19 NOTE — Progress Notes (Signed)
Patient ID: Sheila Jacobs, female   DOB: 1951-01-05, 68 y.o.   MRN: [id number]           Reason for Appointment: HYPOTHYROIDISM, follow-up   Today's office visit was provided via telemedicine using phone call Explained to the patient and the the limitations of evaluation and management by telemedicine and the availability of in person appointments.  The patient understood the limitations and agreed to proceed. Patient also understood that the telehealth visit is billable. . Location of the patient: Home . 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, mostly right sided  Because of her local pressure symptoms with choking feeling, coughing she was referred for thyroidectomy which was done in June 2018 This had relieved her local pressure symptoms  RECENT history:  She has been previously on 150 mcg of levothyroxine supplementation for her postsurgical hypothyroidism since about 08/2017 Because of her TSH being 0.22 in 12/2018 her dose was reduced to 137 mcg  She still complains of her throat feeling tight or uncomfortable occasionally However she has been found to have reflux from the ENT physician and is on Protonix No difficulty swallowing  Recently did not complain of pain palpitations, jitteriness, weight change  Overall she feels fairly good with her energy level She has been quite regular with taking her generic Synthroid with water in the morning before breakfast   TSH is now slightly below normal, previously 1.2  Wt  Readings from Last 3 Encounters:  06/09/19 298 lb (135.2 kg)  04/20/19 291 lb (132 kg)  04/06/19 287 lb (130.2 kg)    Lab Results  Component Value Date   TSH 0.73 08/18/2019   TSH 0.84 04/14/2019   TSH 0.22 (L) 01/08/2019   FREET4 1.54 08/18/2019   FREET4 1.43 04/14/2019   FREET4 1.48 01/08/2019        Allergies as of 08/20/2019      Reactions   Cortisone Rash   Effexor [venlafaxine] Palpitations   Lexapro [escitalopram] Palpitations   Heart racing   Xarelto [rivaroxaban] Rash      Medication List       Accurate as of August 19, 2019  2:57 PM. If you have any questions, ask your nurse or doctor.        acetaminophen 500 MG tablet Commonly known as: TYLENOL Take 500 mg by mouth every 6 (six) hours as needed.   anastrozole 1 MG tablet Commonly known as: ARIMIDEX Take 1 tablet (1 mg total) by mouth daily.   apixaban 5 MG Tabs tablet Commonly known as: ELIQUIS Take 1 tablet (5 mg total) by mouth 2 (two) times daily.   cyclobenzaprine 5 MG tablet Commonly known as: FLEXERIL Take 5 mg by mouth.   gabapentin 100 MG capsule Commonly known as: NEURONTIN Take 100 mg by mouth 3 (three) times daily.   hydrochlorothiazide 25 MG tablet Commonly known as: HYDRODIURIL Take 1 tablet (25 mg total) by mouth daily.   hydrOXYzine 25 MG capsule Commonly known as: VISTARIL  Take 25 mg by mouth 3 (three) times daily as needed.   ketoconazole 2 % cream Commonly known as: NIZORAL APPLY TOPICALLY EVERY DAY AS NEEDED FOR IRRITATION   levothyroxine 137 MCG tablet Commonly known as: SYNTHROID TAKE 1 TABLET (137 MCG TOTAL) BY MOUTH DAILY BEFORE BREAKFAST.   pantoprazole 40 MG tablet Commonly known as: PROTONIX Take 1 tablet (40 mg total) by mouth daily.   potassium chloride 10 MEQ tablet Commonly known as: KLOR-CON TAKE 3 TABLETS BY MOUTH THE FIRST 2 DAYS, THEN 1 TABKET ONCE A DAY       Allergies:  Allergies  Allergen Reactions  . Cortisone Rash  . Effexor  [Venlafaxine] Palpitations  . Lexapro [Escitalopram] Palpitations    Heart racing  . Xarelto [Rivaroxaban] Rash    Past Medical History:  Diagnosis Date  . Acute saddle pulmonary embolism without acute cor pulmonale ([place]) 12/29/2017  . Anginal pain ([place])   . Arthritis   . Breast cancer of upper-outer quadrant of left female breast ([place]) 01/31/2016  . Colon polyps   . GERD (gastroesophageal reflux disease)   . History of radiation therapy 10/9-11/20/17   Left Breast 45 Gy in 25 fractions, Left Breast Boost 6 Gy in 3 fractions.   . Hyperlipidemia   . Hypertension   . Morbid obesity ([place])   . OSA (obstructive sleep apnea) 01/23/2018  . Palpitations 10/16/2016  . Personal history of noncompliance with medical treatment, presenting hazards to health 08/27/2016  . Personal history of radiation therapy   . Thyroid goiter     Past Surgical History:  Procedure Laterality Date  . BREAST BIOPSY Left 01/2016  . BREAST LUMPECTOMY Left 04/2016  . BREAST LUMPECTOMY WITH RADIOACTIVE SEED AND SENTINEL LYMPH NODE BIOPSY Left 04/11/2016   Procedure: BREAST LUMPECTOMY WITH RADIOACTIVE SEED AND SENTINEL LYMPH NODE BIOPSY;  Surgeon: Excell Seltzer, MD;  Location: [place];  Service: General;  Laterality: Left;  . COLONOSCOPY WITH PROPOFOL N/A 10/20/2015   Procedure: COLONOSCOPY WITH PROPOFOL;  Surgeon: Mauri Pole, MD;  [facility];  Service: Endoscopy;  Laterality: N/A;  . ESOPHAGOGASTRODUODENOSCOPY (EGD) WITH PROPOFOL N/A 10/20/2015   Procedure: ESOPHAGOGASTRODUODENOSCOPY (EGD) WITH PROPOFOL;  Surgeon: Mauri Pole, MD;  [facility];  Service: Endoscopy;  Laterality: N/A;  . IR ANGIOGRAM PULMONARY BILATERAL SELECTIVE  12/29/2017  . IR ANGIOGRAM SELECTIVE EACH ADDITIONAL VESSEL  12/29/2017  . IR ANGIOGRAM SELECTIVE EACH ADDITIONAL VESSEL  12/29/2017  . IR INFUSION THROMBOL ARTERIAL INITIAL (MS)  12/29/2017  . IR INFUSION THROMBOL ARTERIAL INITIAL (MS)  12/29/2017  . IR THROMB  F/U EVAL ART/VEN FINAL DAY (MS)  12/30/2017  . IR US GUIDE VASC ACCESS RIGHT  12/29/2017  . LEFT HEART CATHETERIZATION WITH CORONARY ANGIOGRAM N/A 10/21/2014   Procedure: LEFT HEART CATHETERIZATION WITH CORONARY ANGIOGRAM;  Surgeon: Burnell Blanks, MD;  Location: [facility];  Service: Cardiovascular;  Laterality: N/A;  . THYROIDECTOMY N/A 02/26/2017   Procedure: TOTAL THYROIDECTOMY;  Surgeon: Excell Seltzer, MD;  [facility];  Service: General;  Laterality: N/A;  . TUBAL LIGATION      Family History  Problem Relation Age of Onset  . Hypertension Mother   . Heart disease Mother        Pacemaker  . Kidney disease Mother   . Cataracts Mother   . Hearing loss Mother   . Diabetes Mother   . Liver disease Mother   . Diabetes Sister   . Cataracts Brother   . Hearing loss Brother   .  Colon cancer Neg Hx   . Thyroid disease Neg Hx     Social History:  reports that she quit smoking about 5 years ago. Her smoking use included cigarettes. She has a 45.00 pack-year smoking history. She has never used smokeless tobacco. She reports that she does not drink alcohol or use drugs.    Review of Systems   Hypertension, using hydrochlorothiazide  Has not been able to check her blood pressure at home recently  BP Readings from Last 3 Encounters:  06/09/19 135/60  10/22/18 140/70  09/30/18 128/62      Examination:   There were no vitals taken for this visit.            Assessment/Plan:  Postsurgical hypothyroidism  She is still taking 137 mcg levothyroxine, last dosage change was in April 2020  She feels subjectively well with the supplement and is not compliant with her regimen Again reassured her that her symptoms of neck discomfort are unrelated to the levothyroxine and she is getting adequate amounts of medication  TSH is consistently normal at 0.73 now and she will continue the same dose  All questions related to her medication and hypothyroidism were  answered  HYPERTENSION: Reminded her to keep regular follow-up with her PCP  She should have annual diabetes screening from PCP  Follow-up in 6 months with follow-up studies again    Elayne Snare 08/19/2019  Duration of telephone encounter = 5-1/2 minutes

## 2019-08-20 ENCOUNTER — Other Ambulatory Visit: Payer: Self-pay

## 2019-08-20 ENCOUNTER — Encounter: Payer: Self-pay | Admitting: Endocrinology

## 2019-08-20 ENCOUNTER — Ambulatory Visit (INDEPENDENT_AMBULATORY_CARE_PROVIDER_SITE_OTHER): Payer: Medicare Other | Admitting: Endocrinology

## 2019-08-20 DIAGNOSIS — E890 Postprocedural hypothyroidism: Secondary | ICD-10-CM | POA: Diagnosis not present

## 2019-08-20 DIAGNOSIS — Z131 Encounter for screening for diabetes mellitus: Secondary | ICD-10-CM | POA: Diagnosis not present

## 2019-08-24 ENCOUNTER — Telehealth: Payer: Self-pay

## 2019-08-24 ENCOUNTER — Other Ambulatory Visit: Payer: Self-pay | Admitting: Cardiology

## 2019-08-24 NOTE — Telephone Encounter (Signed)
Spoke with patient. She states that her circaid wrap has been shipped but she has not received it yet. She will call back once she receives her wrap to set up an appointment with physical therapy.   Tomma Rakers DPT

## 2019-10-16 DIAGNOSIS — G4733 Obstructive sleep apnea (adult) (pediatric): Secondary | ICD-10-CM | POA: Diagnosis not present

## 2019-10-25 ENCOUNTER — Other Ambulatory Visit: Payer: Self-pay | Admitting: Endocrinology

## 2019-11-04 DIAGNOSIS — K219 Gastro-esophageal reflux disease without esophagitis: Secondary | ICD-10-CM | POA: Diagnosis not present

## 2019-11-04 DIAGNOSIS — E785 Hyperlipidemia, unspecified: Secondary | ICD-10-CM | POA: Diagnosis not present

## 2019-11-04 DIAGNOSIS — Z008 Encounter for other general examination: Secondary | ICD-10-CM | POA: Diagnosis not present

## 2019-11-04 DIAGNOSIS — I10 Essential (primary) hypertension: Secondary | ICD-10-CM | POA: Diagnosis not present

## 2019-11-08 NOTE — Progress Notes (Deleted)
Cardiology Office Note   Date:  11/08/2019   ID:  Sheila Jacobs, DOB 08/23/1951, MRN [id number]  PCP:  Sheila Brasil, FNP  Cardiologist: Dr. Percival Jacobs  No chief complaint on file.    History of Present Illness: Sheila Jacobs is a 69 y.o. female who presents for ongoing assessment and management of chest pain, history of pulmonary emboli on 12/29/2017 with lifelong anticoagulation because of RV dysfunction on echocardiogram, history of OSA on CPAP, she did have a normal cardiac catheterization in 2016 which did not reveal any CAD.  On last office visit 09/09/2018 with Dr. Percival Jacobs it was noted that she was having some chest discomfort and "sticking feeling" in her chest after taking Eliquis.  She also has somatic complaints of a snapping feeling inside her chest and also hearing her heartbeat in her ears sometimes.  On the last office visit she was to continue anticoagulation therapy, she was not discontinued on Eliquis as she tolerated this more compared to Xarelto which caused a rash.  Options to consider Coumadin were discussed with her but she chose to stay on Eliquis.  It was explained to her that the sticking pain in her chest was not cardiac in etiology.  She was to come back on annual follow-up.  She is also being followed by Dr. Golden Jacobs for obstructive sleep apnea and CPAP titration.  She was seen by Dr. Radford Jacobs last on 06/09/2019.    Past Medical History:  Diagnosis Date  . Acute saddle pulmonary embolism without acute cor pulmonale ([place]) 12/29/2017  . Anginal pain ([place])   . Arthritis   . Breast cancer of upper-outer quadrant of left female breast ([place]) 01/31/2016  . Colon polyps   . GERD (gastroesophageal reflux disease)   . History of radiation therapy 10/9-11/20/17   Left Breast 45 Gy in 25 fractions, Left Breast Boost 6 Gy in 3 fractions.   . Hyperlipidemia   . Hypertension   . Morbid obesity ([place])   . OSA (obstructive sleep apnea) 01/23/2018  . Palpitations  10/16/2016  . Personal history of noncompliance with medical treatment, presenting hazards to health 08/27/2016  . Personal history of radiation therapy   . Thyroid goiter     Past Surgical History:  Procedure Laterality Date  . BREAST BIOPSY Left 01/2016  . BREAST LUMPECTOMY Left 04/2016  . BREAST LUMPECTOMY WITH RADIOACTIVE SEED AND SENTINEL LYMPH NODE BIOPSY Left 04/11/2016   Procedure: BREAST LUMPECTOMY WITH RADIOACTIVE SEED AND SENTINEL LYMPH NODE BIOPSY;  Surgeon: Sheila Seltzer, MD;  Location: [place];  Service: General;  Laterality: Left;  . COLONOSCOPY WITH PROPOFOL N/A 10/20/2015   Procedure: COLONOSCOPY WITH PROPOFOL;  Surgeon: Sheila Pole, MD;  [facility];  Service: Endoscopy;  Laterality: N/A;  . ESOPHAGOGASTRODUODENOSCOPY (EGD) WITH PROPOFOL N/A 10/20/2015   Procedure: ESOPHAGOGASTRODUODENOSCOPY (EGD) WITH PROPOFOL;  Surgeon: Sheila Pole, MD;  [facility];  Service: Endoscopy;  Laterality: N/A;  . IR ANGIOGRAM PULMONARY BILATERAL SELECTIVE  12/29/2017  . IR ANGIOGRAM SELECTIVE EACH ADDITIONAL VESSEL  12/29/2017  . IR ANGIOGRAM SELECTIVE EACH ADDITIONAL VESSEL  12/29/2017  . IR INFUSION THROMBOL ARTERIAL INITIAL (MS)  12/29/2017  . IR INFUSION THROMBOL ARTERIAL INITIAL (MS)  12/29/2017  . IR THROMB F/U EVAL ART/VEN FINAL DAY (MS)  12/30/2017  . IR US GUIDE VASC ACCESS RIGHT  12/29/2017  . LEFT HEART CATHETERIZATION WITH CORONARY ANGIOGRAM N/A 10/21/2014   Procedure: LEFT HEART CATHETERIZATION WITH CORONARY ANGIOGRAM;  Surgeon: Sheila Blanks, MD;  Location: [facility];  Service: Cardiovascular;  Laterality: N/A;  . THYROIDECTOMY N/A 02/26/2017   Procedure: TOTAL THYROIDECTOMY;  Surgeon: Sheila Seltzer, MD;  [facility];  Service: General;  Laterality: N/A;  . TUBAL LIGATION       Current Outpatient Medications  Medication Sig Dispense Refill  . acetaminophen (TYLENOL) 500 MG tablet Take 500 mg by mouth every 6 (six) hours as needed.     Sheila Jacobs anastrozole (ARIMIDEX) 1 MG tablet Take 1 tablet (1 mg total) by mouth daily. 90 tablet 3  . cyclobenzaprine (FLEXERIL) 5 MG tablet Take 5 mg by mouth.    Sheila Jacobs 5 MG TABS tablet TAKE 1 TABLET BY MOUTH TWICE A DAY 180 tablet 2  . gabapentin (NEURONTIN) 100 MG capsule Take 100 mg by mouth 3 (three) times daily.    . hydrochlorothiazide (HYDRODIURIL) 25 MG tablet Take 1 tablet (25 mg total) by mouth daily. 90 tablet 3  . hydrOXYzine (VISTARIL) 25 MG capsule Take 25 mg by mouth 3 (three) times daily as needed.    Sheila Jacobs ketoconazole (NIZORAL) 2 % cream APPLY TOPICALLY EVERY DAY AS NEEDED FOR IRRITATION 30 g 1  . levothyroxine (SYNTHROID) 137 MCG tablet TAKE 1 TABLET (137 MCG TOTAL) BY MOUTH DAILY BEFORE BREAKFAST. 60 tablet 1  . pantoprazole (PROTONIX) 40 MG tablet Take 1 tablet (40 mg total) by mouth daily. 30 tablet 11  . potassium chloride (K-DUR) 10 MEQ tablet TAKE 3 TABLETS BY MOUTH THE FIRST 2 DAYS, THEN 1 TABKET ONCE A DAY 35 tablet 0   No current facility-administered medications for this visit.    Allergies:   Cortisone, Effexor [venlafaxine], Lexapro [escitalopram], and Xarelto [rivaroxaban]    Social History:  The patient  reports that she quit smoking about 5 years ago. Her smoking use included cigarettes. She has a 45.00 pack-year smoking history. She has never used smokeless tobacco. She reports that she does not drink alcohol or use drugs.   Family History:  The patient's family history includes Cataracts in her brother and mother; Diabetes in her mother and sister; Hearing loss in her brother and mother; Heart disease in her mother; Hypertension in her mother; Kidney disease in her mother; Liver disease in her mother.    ROS: All other systems are reviewed and negative. Unless otherwise mentioned in H&P    PHYSICAL EXAM: VS:  There were no vitals taken for this visit. , BMI There is no height or weight on file to calculate BMI. GEN: Well nourished, well developed, in no  acute distress HEENT: normal Neck: no JVD, carotid bruits, or masses Cardiac: ***RRR; no murmurs, rubs, or gallops,no edema  Respiratory:  Clear to auscultation bilaterally, normal work of breathing GI: soft, nontender, nondistended, + BS MS: no deformity or atrophy Skin: warm and dry, no rash Neuro:  Strength and sensation are intact Psych: euthymic mood, full affect   EKG:  EKG {ACTION; IS/IS [id number] ordered today. The ekg ordered today demonstrates ***   Recent Labs: 08/18/2019: TSH 0.73    Lipid Panel    Component Value Date/Time   CHOL 174 07/04/2018 1042   TRIG 55 07/04/2018 1042   HDL 71 07/04/2018 1042   CHOLHDL 2.5 07/04/2018 1042   CHOLHDL 2.6 08/09/2016 1016   VLDL 8 08/09/2016 1016   LDLCALC 92 07/04/2018 1042      Wt Readings from Last 3 Encounters:  06/09/19 298 lb (135.2 kg)  04/20/19 291 lb (132 kg)  04/06/19 287 lb (130.2 kg)  Other studies Reviewed: Echocardiogram 05-10-2018 Left ventricle: The cavity size was normal. Systolic function was  normal. Wall motion was normal; there were no regional wall  motion abnormalities. There was an increased relative  contribution of atrial contraction to ventricular filling, which  may be due to aging or hypovolemia. Left ventricular diastolic  function parameters were normal.  - Aortic arch: The aortic arch had moderate diffuse disease.    ASSESSMENT AND PLAN:  1.  ***   Current medicines are reviewed at length with the patient today.  I have spent *** dedicated to the care of this patient on the date of this encounter to include pre-visit review of records, assessment, management and diagnostic testing,with shared decision making.  Labs/ tests ordered today include: *** Phill Myron. West Pugh, ANP, [facility]   11/08/2019 10:18 AM    [facility] [address] Office [phone] Fax [phone]  Notice: This dictation was prepared with Dragon  dictation along with smaller phrase technology. Any transcriptional errors that result from this process are unintentional and may not be corrected upon review.

## 2019-11-09 ENCOUNTER — Telehealth: Payer: Self-pay | Admitting: Cardiology

## 2019-11-09 ENCOUNTER — Ambulatory Visit: Payer: Medicare Other | Admitting: Adult Health

## 2019-11-09 NOTE — Telephone Encounter (Signed)
Attempted to contact patient to discuss exactly what issues with this medication.  Patient did not answer, and unable to leave message- but will route to nurse to make aware.

## 2019-11-09 NOTE — Progress Notes (Deleted)
Cardiology Office Note   Date:  11/09/2019   ID:  [name], DOB [birth year], MRN [id number]  PCP:  Sheila Brasil, FNP  Cardiologist: Dr. [name]  No chief complaint on file.    History of Present Illness: Sheila Jacobs is a 69 y.o. female who presents for ongoing assessment and management of hx of PE, atypical chest pain, with other hx of OSA, with chronic knee pain. Other hx of HTN,Hypothyroidism, GERD.   Last seen by Dr. [name] on 10/22/2018. She was stable at that time. She was to continue Eliquis.     Past Medical History:  Diagnosis Date  . Acute saddle pulmonary embolism without acute cor pulmonale ([place]) 12/29/2017  . Anginal pain ([place])   . Arthritis   . Breast cancer of upper-outer quadrant of left female breast ([place]) 01/31/2016  . Colon polyps   . GERD (gastroesophageal reflux disease)   . History of radiation therapy 10/9-11/20/17   Left Breast 45 Gy in 25 fractions, Left Breast Boost 6 Gy in 3 fractions.   . Hyperlipidemia   . Hypertension   . Morbid obesity ([place])   . OSA (obstructive sleep apnea) 01/23/2018  . Palpitations 10/16/2016  . Personal history of noncompliance with medical treatment, presenting hazards to health 08/27/2016  . Personal history of radiation therapy   . Thyroid goiter     Past Surgical History:  Procedure Laterality Date  . BREAST BIOPSY Left 01/2016  . BREAST LUMPECTOMY Left 04/2016  . BREAST LUMPECTOMY WITH RADIOACTIVE SEED AND SENTINEL LYMPH NODE BIOPSY Left 04/11/2016   Procedure: BREAST LUMPECTOMY WITH RADIOACTIVE SEED AND SENTINEL LYMPH NODE BIOPSY;  Surgeon: Sheila Seltzer, MD;  Location: [place];  Service: General;  Laterality: Left;  . COLONOSCOPY WITH PROPOFOL N/A 10/20/2015   Procedure: COLONOSCOPY WITH PROPOFOL;  Surgeon: Sheila Pole, MD;  [facility];  Service: Endoscopy;  Laterality: N/A;  . ESOPHAGOGASTRODUODENOSCOPY (EGD) WITH PROPOFOL N/A 10/20/2015   Procedure: ESOPHAGOGASTRODUODENOSCOPY (EGD) WITH  PROPOFOL;  Surgeon: Sheila Pole, MD;  [facility];  Service: Endoscopy;  Laterality: N/A;  . IR ANGIOGRAM PULMONARY BILATERAL SELECTIVE  12/29/2017  . IR ANGIOGRAM SELECTIVE EACH ADDITIONAL VESSEL  12/29/2017  . IR ANGIOGRAM SELECTIVE EACH ADDITIONAL VESSEL  12/29/2017  . IR INFUSION THROMBOL ARTERIAL INITIAL (MS)  12/29/2017  . IR INFUSION THROMBOL ARTERIAL INITIAL (MS)  12/29/2017  . IR THROMB F/U EVAL ART/VEN FINAL DAY (MS)  12/30/2017  . IR US GUIDE VASC ACCESS RIGHT  12/29/2017  . LEFT HEART CATHETERIZATION WITH CORONARY ANGIOGRAM N/A 10/21/2014   Procedure: LEFT HEART CATHETERIZATION WITH CORONARY ANGIOGRAM;  Surgeon: Sheila Blanks, MD;  Location: [facility];  Service: Cardiovascular;  Laterality: N/A;  . THYROIDECTOMY N/A 02/26/2017   Procedure: TOTAL THYROIDECTOMY;  Surgeon: Sheila Seltzer, MD;  [facility];  Service: General;  Laterality: N/A;  . TUBAL LIGATION       Current Outpatient Medications  Medication Sig Dispense Refill  . acetaminophen (TYLENOL) 500 MG tablet Take 500 mg by mouth every 6 (six) hours as needed.    Marland Kitchen anastrozole (ARIMIDEX) 1 MG tablet Take 1 tablet (1 mg total) by mouth daily. 90 tablet 3  . cyclobenzaprine (FLEXERIL) 5 MG tablet Take 5 mg by mouth.    Sheila Jacobs 5 MG TABS tablet TAKE 1 TABLET BY MOUTH TWICE A DAY 180 tablet 2  . gabapentin (NEURONTIN) 100 MG capsule Take 100 mg by mouth 3 (three) times daily.    . hydrochlorothiazide (  HYDRODIURIL) 25 MG tablet Take 1 tablet (25 mg total) by mouth daily. 90 tablet 3  . hydrOXYzine (VISTARIL) 25 MG capsule Take 25 mg by mouth 3 (three) times daily as needed.    Marland Kitchen ketoconazole (NIZORAL) 2 % cream APPLY TOPICALLY EVERY DAY AS NEEDED FOR IRRITATION 30 g 1  . levothyroxine (SYNTHROID) 137 MCG tablet TAKE 1 TABLET (137 MCG TOTAL) BY MOUTH DAILY BEFORE BREAKFAST. 60 tablet 1  . pantoprazole (PROTONIX) 40 MG tablet Take 1 tablet (40 mg total) by mouth daily. 30 tablet 11  . potassium  chloride (K-DUR) 10 MEQ tablet TAKE 3 TABLETS BY MOUTH THE FIRST 2 DAYS, THEN 1 TABKET ONCE A DAY 35 tablet 0   No current facility-administered medications for this visit.    Allergies:   Cortisone, Effexor [venlafaxine], Lexapro [escitalopram], and Xarelto [rivaroxaban]    Social History:  The patient  reports that she quit smoking about 5 years ago. Her smoking use included cigarettes. She has a 45.00 pack-year smoking history. She has never used smokeless tobacco. She reports that she does not drink alcohol or use drugs.   Family History:  The patient's family history includes Cataracts in her brother and mother; Diabetes in her mother and sister; Hearing loss in her brother and mother; Heart disease in her mother; Hypertension in her mother; Kidney disease in her mother; Liver disease in her mother.    ROS: All other systems are reviewed and negative. Unless otherwise mentioned in H&P    PHYSICAL EXAM: VS:  There were no vitals taken for this visit. , BMI There is no height or weight on file to calculate BMI. GEN: Well nourished, well developed, in no acute distress HEENT: normal Neck: no JVD, carotid bruits, or masses Cardiac: ***RRR; no murmurs, rubs, or gallops,no edema  Respiratory:  Clear to auscultation bilaterally, normal work of breathing GI: soft, nontender, nondistended, + BS MS: no deformity or atrophy Skin: warm and dry, no rash Neuro:  Strength and sensation are intact Psych: euthymic mood, full affect   EKG:  EKG {ACTION; IS/IS [id number] ordered today. The ekg ordered today demonstrates ***   Recent Labs: 08/18/2019: TSH 0.73    Lipid Panel    Component Value Date/Time   CHOL 174 07/04/2018 1042   TRIG 55 07/04/2018 1042   HDL 71 07/04/2018 1042   CHOLHDL 2.5 07/04/2018 1042   CHOLHDL 2.6 08/09/2016 1016   VLDL 8 08/09/2016 1016   LDLCALC 92 07/04/2018 1042      Wt Readings from Last 3 Encounters:  06/09/19 298 lb (135.2 kg)  04/20/19 291 lb  (132 kg)  04/06/19 287 lb (130.2 kg)      Other studies Reviewed: Left ventricle: The cavity size was normal. Systolic function was  normal. Wall motion was normal; there were no regional wall  motion abnormalities. There was an increased relative  contribution of atrial contraction to ventricular filling, which  may be due to aging or hypovolemia. Left ventricular diastolic  function parameters were normal.  - Aortic arch: The aortic arch had moderate diffuse disease.   Impressions:   - Compared to April 2019, right ventricular size and function has  returned to normal.    ASSESSMENT AND PLAN:  1.  ***   Current medicines are reviewed at length with the patient today.  I have spent *** dedicated to the care of this patient on the date of this encounter to include pre-visit review of records, assessment, management and diagnostic testing,with shared  decision making.  Labs/ tests ordered today include: *** Phill Myron. West Pugh, ANP, [facility]   11/09/2019 10:27 AM    [facility] [address] [address] Office [phone] Fax [phone]  Notice: This dictation was prepared with Dragon dictation along with smaller phrase technology. Any transcriptional errors that result from this process are unintentional and may not be corrected upon review.

## 2019-11-09 NOTE — Telephone Encounter (Signed)
Spoke with patient. She reports she saw her PCP on Thursday for chest pain. Her PCP performed an EKG in office. Patient was started on Omeprazole and Metoprolol. Patient called to ask why she was on metoprolol. Advised patient to contact PCP and ask them why she was started on the Metoprolol.   Patient reports burning chest pain, not having active chest pain during call. States she was started on Omeprazole for this and told it was most likely acid reflux. No chest pain in the last day.   Patient has a follow up appointment with her PCP on 11/19/2019.

## 2019-11-09 NOTE — Telephone Encounter (Signed)
° °  Pt c/o medication issue:  1. Name of Medication: metoprolol  2. How are you currently taking this medication (dosage and times per day)?   3. Are you having a reaction (difficulty breathing--STAT)?   4. What is your medication issue? Pt called she said that her PCP prescribed her metoprolol recently, she took it last Friday and her chest hurts after taking it. Advised pt to contact PCP, she still wanted to let Dr. [name] know.

## 2019-11-12 ENCOUNTER — Ambulatory Visit: Payer: Medicare Other | Admitting: Adult Health

## 2019-11-12 DIAGNOSIS — Z79899 Other long term (current) drug therapy: Secondary | ICD-10-CM | POA: Diagnosis not present

## 2019-11-13 ENCOUNTER — Telehealth: Payer: Medicare Other | Admitting: Physician Assistant

## 2019-11-13 ENCOUNTER — Telehealth: Payer: Self-pay

## 2019-11-13 NOTE — Progress Notes (Unsigned)
{Choose 1 Note Type (Telehealth Visit or Telephone Visit):[phone]}  Evaluation Performed:  Follow-up visit  This visit type was conducted due to national recommendations for restrictions regarding the COVID-19 Pandemic (e.g. social distancing).  This format is felt to be most appropriate for this patient at this time.  All issues noted in this document were discussed and addressed.  No physical exam was performed (except for noted visual exam findings with Video Visits).  Please refer to the patient's chart (MyChart message for video visits and phone note for telephone visits) for the patient's consent to telehealth for [facility].  Date:  11/13/2019   ID:  Sheila Jacobs, DOB 02/19/1951, MRN [id number]  Patient Location:  ***  Provider location:   Off site  PCP:  Sheila Jacobs, Sheila Jacobs  Cardiologist:  Sheila Breeding, MD 10/22/2018 Sheila Jacobs, [facility] 09/09/2018 Electrophysiologist:  None   Chief Complaint:  ***  History of Present Illness:    Sheila Jacobs is a 69 y.o. female who presents via audio/video conferencing for a telehealth visit today.    69 y.o. yo female who has a hx of PE04/21/2019with lifelong anticoagulation recommended because of RV dysfunction on echo, OSA on CPAP, nl cors at cath 2016, hypothyroid, rash with Xarelto>> Eliquis, morbid obesity  ***   The patient {does/does not:200015} have symptoms concerning for COVID-19 infection (fever, chills, cough, or new shortness of breath).    Prior CV studies:   The following studies were reviewed today:  ***  Past Medical History:  Diagnosis Date  . Acute saddle pulmonary embolism without acute cor pulmonale ([place]) 12/29/2017  . Anginal pain ([place])   . Arthritis   . Breast cancer of upper-outer quadrant of left female breast ([place]) 01/31/2016  . Colon polyps   . GERD (gastroesophageal reflux disease)   . History of radiation therapy 10/9-11/20/17   Left Breast 45 Gy in 25 fractions, Left Breast Boost  6 Gy in 3 fractions.   . Hyperlipidemia   . Hypertension   . Morbid obesity ([place])   . OSA (obstructive sleep apnea) 01/23/2018  . Palpitations 10/16/2016  . Personal history of noncompliance with medical treatment, presenting hazards to health 08/27/2016  . Personal history of radiation therapy   . Thyroid goiter    Past Surgical History:  Procedure Laterality Date  . BREAST BIOPSY Left 01/2016  . BREAST LUMPECTOMY Left 04/2016  . BREAST LUMPECTOMY WITH RADIOACTIVE SEED AND SENTINEL LYMPH NODE BIOPSY Left 04/11/2016   Procedure: BREAST LUMPECTOMY WITH RADIOACTIVE SEED AND SENTINEL LYMPH NODE BIOPSY;  Surgeon: Excell Seltzer, MD;  Location: [place];  Service: General;  Laterality: Left;  . COLONOSCOPY WITH PROPOFOL N/A 10/20/2015   Procedure: COLONOSCOPY WITH PROPOFOL;  Surgeon: Mauri Pole, MD;  Location: [facility];  Service: Endoscopy;  Laterality: N/A;  . ESOPHAGOGASTRODUODENOSCOPY (EGD) WITH PROPOFOL N/A 10/20/2015   Procedure: ESOPHAGOGASTRODUODENOSCOPY (EGD) WITH PROPOFOL;  Surgeon: Mauri Pole, MD;  [facility];  Service: Endoscopy;  Laterality: N/A;  . IR ANGIOGRAM PULMONARY BILATERAL SELECTIVE  12/29/2017  . IR ANGIOGRAM SELECTIVE EACH ADDITIONAL VESSEL  12/29/2017  . IR ANGIOGRAM SELECTIVE EACH ADDITIONAL VESSEL  12/29/2017  . IR INFUSION THROMBOL ARTERIAL INITIAL (MS)  12/29/2017  . IR INFUSION THROMBOL ARTERIAL INITIAL (MS)  12/29/2017  . IR THROMB F/U EVAL ART/VEN FINAL DAY (MS)  12/30/2017  . IR US GUIDE VASC ACCESS RIGHT  12/29/2017  . LEFT HEART CATHETERIZATION WITH CORONARY ANGIOGRAM N/A 10/21/2014   Procedure: LEFT  HEART CATHETERIZATION WITH CORONARY ANGIOGRAM;  Surgeon: Burnell Blanks, MD;  Location: [facility] CATH LAB;  Service: Cardiovascular;  Laterality: N/A;  . THYROIDECTOMY N/A 02/26/2017   Procedure: TOTAL THYROIDECTOMY;  Surgeon: Excell Seltzer, MD;  Location: [facility];  Service: General;  Laterality: N/A;  . TUBAL LIGATION       No outpatient  medications have been marked as taking for the 11/13/19 encounter (Appointment) with Sheila Jacobs, Sheila Croon, Sheila Jacobs.     Allergies:   Cortisone, Effexor [venlafaxine], Lexapro [escitalopram], and Xarelto [rivaroxaban]   Social History   Tobacco Use  . Smoking status: Former Smoker    Packs/day: 1.00    Years: 45.00    Pack years: 45.00    Types: Cigarettes    Quit date: 07/11/2014    Years since quitting: 5.3  . Smokeless tobacco: Never Used  Substance Use Topics  . Alcohol use: No    Alcohol/week: 0.0 standard drinks  . Drug use: No     Family Hx: The patient's family history includes Cataracts in her brother and mother; Diabetes in her mother and sister; Hearing loss in her brother and mother; Heart disease in her mother; Hypertension in her mother; Kidney disease in her mother; Liver disease in her mother. There is no history of Colon cancer or Thyroid disease.  ROS:   Please see the history of present illness.    All other systems reviewed and are negative.   Labs/Other Tests and Data Reviewed:    Recent Labs: 08/18/2019: TSH 0.73   CBC    Component Value Date/Time   WBC 3.6 10/22/2018 1030   WBC 3.2 (L) 01/02/2018 2057   RBC 3.79 10/22/2018 1030   RBC 3.42 (L) 01/02/2018 2057   HGB 12.4 10/22/2018 1030   HGB 11.2 (L) 08/29/2016 0856   HCT 35.2 10/22/2018 1030   HCT 33.9 (L) 08/29/2016 0856   PLT 227 10/22/2018 1030   MCV 93 10/22/2018 1030   MCV 95.0 08/29/2016 0856   MCH 32.7 10/22/2018 1030   MCH 32.2 01/02/2018 2057   MCHC 35.2 10/22/2018 1030   MCHC 32.9 01/02/2018 2057   RDW 12.2 10/22/2018 1030   RDW 12.6 08/29/2016 0856   LYMPHSABS 1.1 01/02/2018 2057   LYMPHSABS 1.3 08/29/2016 0856   MONOABS 0.4 01/02/2018 2057   MONOABS 0.3 08/29/2016 0856   EOSABS 0.2 01/02/2018 2057   EOSABS 0.1 08/29/2016 0856   BASOSABS 0.0 01/02/2018 2057   BASOSABS 0.0 08/29/2016 0856    CMP Latest Ref Rng & Units 09/09/2018 07/22/2018 02/11/2018  Glucose 65 - 99 mg/dL 100(H)  94 112(H)  BUN 8 - 27 mg/dL 11 8 10   Creatinine 0.57 - 1.00 mg/dL 0.85 0.79 0.77  Sodium 134 - 144 mmol/L 138 139 140  Potassium 3.5 - 5.2 mmol/L 3.7 3.0(L) 3.6  Chloride 96 - 106 mmol/L 96 99 104  CO2 20 - 29 mmol/L 26 26 30   Calcium 8.7 - 10.3 mg/dL 10.1 9.7 10.1  Total Protein 6.5 - 8.1 g/dL - - -  Total Bilirubin 0.3 - 1.2 mg/dL - - -  Alkaline Phos 38 - 126 U/L - - -  AST 15 - 41 U/L - - -  ALT 14 - 54 U/L - - -     Recent Lipid Panel Lab Results  Component Value Date/Time   CHOL 174 07/04/2018 10:42 AM   TRIG 55 07/04/2018 10:42 AM   HDL 71 07/04/2018 10:42 AM   CHOLHDL 2.5 07/04/2018 10:42 AM  CHOLHDL 2.6 08/09/2016 10:16 AM   LDLCALC 92 07/04/2018 10:42 AM    Wt Readings from Last 3 Encounters:  06/09/19 298 lb (135.2 kg)  04/20/19 291 lb (132 kg)  04/06/19 287 lb (130.2 kg)     Objective:    Vital Signs:  There were no vitals taken for this visit.   69 y.o. female in no*** acute distress.   ASSESSMENT & PLAN:    1.  ***  COVID-19 Education: The signs and symptoms of COVID-19 were discussed with the patient and how to seek care for testing (follow up with PCP or arrange E-visit).  The importance of social distancing was discussed today.  Patient Risk:   After full review of this patient's clinical status, I feel that they are at least moderate risk at this time.  Time:   Today, I have spent *** minutes with the patient with telehealth technology discussing ***.     Medication Adjustments/Labs and Tests Ordered: Current medicines are reviewed at length with the patient today.  Concerns regarding medicines are outlined above.  Tests Ordered: No orders of the defined types were placed in this encounter.  Medication Changes: No orders of the defined types were placed in this encounter.   Disposition:  Follow up with Sheila Breeding, MD   Signed, Rosaria Ferries, Sheila Jacobs  11/13/2019 10:03 AM    [place]

## 2019-11-13 NOTE — Telephone Encounter (Signed)
Left a voice message for the patient informing her that I was calling to go over her medications and obtain vitals if possible before her virtual visit with Rosaria Ferries, PA-C. Will try calling again.

## 2019-11-13 NOTE — Telephone Encounter (Addendum)
Called the patient multiple times for her virtual visit and called the number listed for her listed for her daughter Jalaiah Dolence who is on the patient's DPR. I could not leave a message for voicemail box is full.

## 2019-11-16 ENCOUNTER — Telehealth (INDEPENDENT_AMBULATORY_CARE_PROVIDER_SITE_OTHER): Payer: Medicare Other | Admitting: Physician Assistant

## 2019-11-16 VITALS — BP 135/75 | HR 70 | Ht 63.0 in | Wt 300.0 lb

## 2019-11-16 DIAGNOSIS — I10 Essential (primary) hypertension: Secondary | ICD-10-CM | POA: Diagnosis not present

## 2019-11-16 DIAGNOSIS — R0789 Other chest pain: Secondary | ICD-10-CM

## 2019-11-16 DIAGNOSIS — Z86711 Personal history of pulmonary embolism: Secondary | ICD-10-CM

## 2019-11-16 DIAGNOSIS — Z7901 Long term (current) use of anticoagulants: Secondary | ICD-10-CM

## 2019-11-16 DIAGNOSIS — Z6841 Body Mass Index (BMI) 40.0 and over, adult: Secondary | ICD-10-CM

## 2019-11-16 DIAGNOSIS — Z86718 Personal history of other venous thrombosis and embolism: Secondary | ICD-10-CM | POA: Diagnosis not present

## 2019-11-16 NOTE — Patient Instructions (Signed)
Medication Instructions:  Your physician recommends that you continue on your current medications as directed. Please refer to the Current Medication list given to you today.  *If you need a refill on your cardiac medications before your next appointment, please call your pharmacy*   Lab Work: I am calling your Primary Care to request recent blood work.  If you have labs (blood work) drawn today and your tests are completely normal, you will receive your results only by: Marland Kitchen MyChart Message (if you have MyChart) OR . A paper copy in the mail If you have any lab test that is abnormal or we need to change your treatment, we will call you to review the results.   Testing/Procedures: I am calling your Primary Care to request recent testing.   Follow-Up: At [facility], you and your health needs are our priority.  As part of our continuing mission to provide you with exceptional heart care, we have created designated Provider Care Teams.  These Care Teams include your primary Cardiologist (physician) and Advanced Practice Providers (APPs -  Physician Assistants and Nurse Practitioners) who all work together to provide you with the care you need, when you need it.  We recommend signing up for the patient portal called "MyChart".  Sign up information is provided on this After Visit Summary.  MyChart is used to connect with patients for Virtual Visits (Telemedicine).  Patients are able to view lab/test results, encounter notes, upcoming appointments, etc.  Non-urgent messages can be sent to your provider as well.   To learn more about what you can do with MyChart, go to [email or web address].    Your next appointment:   12 month(s)  The format for your next appointment:   In Person  Provider:   Minus Breeding, MD   Other Instructions Please call our office two months in advance to schedule your follow-up annual appointment. If results of recent testing and labs are abnormal, we may  call you to schedule a sooner appointment.

## 2019-11-16 NOTE — Progress Notes (Signed)
Virtual Visit via Telephone Note   This visit type was conducted due to national recommendations for restrictions regarding the COVID-19 Pandemic (e.g. social distancing) in an effort to limit this patient's exposure and mitigate transmission in our community.  Due to her co-morbid illnesses, this patient is at least at moderate risk for complications without adequate follow up.  This format is felt to be most appropriate for this patient at this time.  The patient did not have access to video technology/had technical difficulties with video requiring transitioning to audio format only (telephone).  All issues noted in this document were discussed and addressed.  No physical exam could be performed with this format.  Please refer to the patient's chart for her  consent to telehealth for [facility].  Evaluation Performed:  Follow-up visit  This visit type was conducted due to national recommendations for restrictions regarding the COVID-19 Pandemic (e.g. social distancing).  This format is felt to be most appropriate for this patient at this time.  All issues noted in this document were discussed and addressed.  No physical exam was performed (except for noted visual exam findings with Video Visits).  Please refer to the patient's chart (MyChart message for video visits and phone note for telephone visits) for the patient's consent to telehealth for [facility].  Date:  11/16/2019   ID:  Sheila Jacobs, DOB 1951-06-20, MRN [id number]  Patient Location:  Home  Provider location:   Off-site  PCP:  [name], [place] Cardiologist:  Minus Breeding, MD 10/22/2018 Electrophysiologist:  None   Chief Complaint:  Cardiology follow up  History of Present Illness:    Sheila Jacobs is a 69 y.o. female who presents via audio/video conferencing for a telehealth visit today.    69 y.o. yo female who has a hx of PE04/21/2019with lifelong anticoagulation  recommended because of RV dysfunction on echo, OSA on CPAP, nl cors at cath 2016, hypothyroid, rash with Xarelto>>Eliquis, morbid obesity  She had some testing by her PCP, we do not have those records. Her PCP said something about her heart.   Her CPAP acted like it wanted to stop, it scared her and she shut it off. However, she is using it regularly.   The gabapentin was started for her symptoms and chest pain. It has helped her symptoms, she is not hearing the noise in her right ear. Tid was too much for her, she could not get up off the cough.   Sometimes she feels a bubbling in her chest. Her doctor told her it was GERD. She takes omeprazole daily for this, it helps.   Her last episode of chest pain was last pm. She did not take anything for it. It was the bubbling feeling. She has never tried Mylanta or GasX. She has taken ASA for it, not sure that it helped.   When she walks, she gets pain in her legs and hips. Her calf and other muscles in her legs will hurt.   She had ordered some compression stockings last August, has not gotten them yet.    The patient does not have symptoms concerning for COVID-19 infection (fever, chills, cough, or new shortness of breath).    Prior CV studies:   The following studies were reviewed today:  DOPPER: 09/30/2018 Summary:  Right: There is no evidence of deep vein thrombosis in the lower  extremity. However, Chronic hyperechoic material residual seen at distal  segment of femoral vein with patency. Portions of this examination were  limited- see technologist comments above.  No cystic structure found in the popliteal fossa.  Left: No evidence of common femoral vein obstruction.   ECHO: 05/08/2018  - Left ventricle: The cavity size was normal. Systolic function was  normal. Wall motion was normal; there were no regional wall  motion abnormalities. There was an increased relative  contribution of atrial contraction to ventricular  filling, which  may be due to aging or hypovolemia. Left ventricular diastolic  function parameters were normal.  - Aortic arch: The aortic arch had moderate diffuse disease.   Impressions:   - Compared to April 2019, right ventricular size and function has  returned to normal.   Past Medical History:  Diagnosis Date  . Acute saddle pulmonary embolism without acute cor pulmonale ([place]) 12/29/2017  . Anginal pain ([place])   . Arthritis   . Breast cancer of upper-outer quadrant of left female breast ([place]) 01/31/2016  . Colon polyps   . GERD (gastroesophageal reflux disease)   . History of radiation therapy 10/9-11/20/17   Left Breast 45 Gy in 25 fractions, Left Breast Boost 6 Gy in 3 fractions.   . Hyperlipidemia   . Hypertension   . Morbid obesity ([place])   . OSA (obstructive sleep apnea) 01/23/2018  . Palpitations 10/16/2016  . Personal history of noncompliance with medical treatment, presenting hazards to health 08/27/2016  . Personal history of radiation therapy   . Thyroid goiter    Past Surgical History:  Procedure Laterality Date  . BREAST BIOPSY Left 01/2016  . BREAST LUMPECTOMY Left 04/2016  . BREAST LUMPECTOMY WITH RADIOACTIVE SEED AND SENTINEL LYMPH NODE BIOPSY Left 04/11/2016   Procedure: BREAST LUMPECTOMY WITH RADIOACTIVE SEED AND SENTINEL LYMPH NODE BIOPSY;  Surgeon: Excell Seltzer, MD;  Location: [place];  Service: General;  Laterality: Left;  . COLONOSCOPY WITH PROPOFOL N/A 10/20/2015   Procedure: COLONOSCOPY WITH PROPOFOL;  Surgeon: Mauri Pole, MD;  [facility];  Service: Endoscopy;  Laterality: N/A;  . ESOPHAGOGASTRODUODENOSCOPY (EGD) WITH PROPOFOL N/A 10/20/2015   Procedure: ESOPHAGOGASTRODUODENOSCOPY (EGD) WITH PROPOFOL;  Surgeon: Mauri Pole, MD;  [facility];  Service: Endoscopy;  Laterality: N/A;  . IR ANGIOGRAM PULMONARY BILATERAL SELECTIVE  12/29/2017  . IR ANGIOGRAM SELECTIVE EACH ADDITIONAL VESSEL  12/29/2017  . IR ANGIOGRAM  SELECTIVE EACH ADDITIONAL VESSEL  12/29/2017  . IR INFUSION THROMBOL ARTERIAL INITIAL (MS)  12/29/2017  . IR INFUSION THROMBOL ARTERIAL INITIAL (MS)  12/29/2017  . IR THROMB F/U EVAL ART/VEN FINAL DAY (MS)  12/30/2017  . IR US GUIDE VASC ACCESS RIGHT  12/29/2017  . LEFT HEART CATHETERIZATION WITH CORONARY ANGIOGRAM N/A 10/21/2014   Procedure: LEFT HEART CATHETERIZATION WITH CORONARY ANGIOGRAM;  Surgeon: Burnell Blanks, MD;  Location: [facility] CATH LAB;  Service: Cardiovascular;  Laterality: N/A;  . THYROIDECTOMY N/A 02/26/2017   Procedure: TOTAL THYROIDECTOMY;  Surgeon: Excell Seltzer, MD;  [facility];  Service: General;  Laterality: N/A;  . TUBAL LIGATION       Current Meds  Medication Sig  . acetaminophen (TYLENOL) 500 MG tablet Take 500 mg by mouth every 6 (six) hours as needed.  Marland Kitchen anastrozole (ARIMIDEX) 1 MG tablet Take 1 tablet (1 mg total) by mouth daily.  . cyclobenzaprine (FLEXERIL) 5 MG tablet Take 5 mg by mouth.  Arne Cleveland 5 MG TABS tablet TAKE 1 TABLET BY MOUTH TWICE A DAY  . gabapentin (NEURONTIN) 100 MG capsule Take 100  mg by mouth 3 (three) times daily.  . hydrochlorothiazide (HYDRODIURIL) 25 MG tablet Take 1 tablet (25 mg total) by mouth daily.  . hydrOXYzine (VISTARIL) 25 MG capsule Take 25 mg by mouth 3 (three) times daily as needed.  Marland Kitchen ketoconazole (NIZORAL) 2 % cream APPLY TOPICALLY EVERY DAY AS NEEDED FOR IRRITATION  . levothyroxine (SYNTHROID) 137 MCG tablet TAKE 1 TABLET (137 MCG TOTAL) BY MOUTH DAILY BEFORE BREAKFAST.  . pantoprazole (PROTONIX) 40 MG tablet Take 1 tablet (40 mg total) by mouth daily.  . potassium chloride (K-DUR) 10 MEQ tablet TAKE 3 TABLETS BY MOUTH THE FIRST 2 DAYS, THEN 1 TABKET ONCE A DAY     Allergies:   Cortisone, Effexor [venlafaxine], Lexapro [escitalopram], and Xarelto [rivaroxaban]   Social History   Tobacco Use  . Smoking status: Former Smoker    Packs/day: 1.00    Years: 45.00    Pack years: 45.00    Types: Cigarettes     Quit date: 07/11/2014    Years since quitting: 5.3  . Smokeless tobacco: Never Used  Substance Use Topics  . Alcohol use: No    Alcohol/week: 0.0 standard drinks  . Drug use: No     Family Hx: The patient's family history includes Cataracts in her brother and mother; Diabetes in her mother and sister; Hearing loss in her brother and mother; Heart disease in her mother; Hypertension in her mother; Kidney disease in her mother; Liver disease in her mother. There is no history of Colon cancer or Thyroid disease.  ROS:   Please see the history of present illness.    All other systems reviewed and are negative.   Labs/Other Tests and Data Reviewed:    Recent Labs: 08/18/2019: TSH 0.73   CBC    Component Value Date/Time   WBC 3.6 10/22/2018 1030   WBC 3.2 (L) 01/02/2018 2057   RBC 3.79 10/22/2018 1030   RBC 3.42 (L) 01/02/2018 2057   HGB 12.4 10/22/2018 1030   HGB 11.2 (L) 08/29/2016 0856   HCT 35.2 10/22/2018 1030   HCT 33.9 (L) 08/29/2016 0856   PLT 227 10/22/2018 1030   MCV 93 10/22/2018 1030   MCV 95.0 08/29/2016 0856   MCH 32.7 10/22/2018 1030   MCH 32.2 01/02/2018 2057   MCHC 35.2 10/22/2018 1030   MCHC 32.9 01/02/2018 2057   RDW 12.2 10/22/2018 1030   RDW 12.6 08/29/2016 0856   LYMPHSABS 1.1 01/02/2018 2057   LYMPHSABS 1.3 08/29/2016 0856   MONOABS 0.4 01/02/2018 2057   MONOABS 0.3 08/29/2016 0856   EOSABS 0.2 01/02/2018 2057   EOSABS 0.1 08/29/2016 0856   BASOSABS 0.0 01/02/2018 2057   BASOSABS 0.0 08/29/2016 0856    CMP Latest Ref Rng & Units 09/09/2018 07/22/2018 02/11/2018  Glucose 65 - 99 mg/dL 100(H) 94 112(H)  BUN 8 - 27 mg/dL 11 8 10   Creatinine 0.57 - 1.00 mg/dL 0.85 0.79 0.77  Sodium 134 - 144 mmol/L 138 139 140  Potassium 3.5 - 5.2 mmol/L 3.7 3.0(L) 3.6  Chloride 96 - 106 mmol/L 96 99 104  CO2 20 - 29 mmol/L 26 26 30   Calcium 8.7 - 10.3 mg/dL 10.1 9.7 10.1  Total Protein 6.5 - 8.1 g/dL - - -  Total Bilirubin 0.3 - 1.2 mg/dL - - -  Alkaline Phos  38 - 126 U/L - - -  AST 15 - 41 U/L - - -  ALT 14 - 54 U/L - - -     Recent  Lipid Panel Lab Results  Component Value Date/Time   CHOL 174 07/04/2018 10:42 AM   TRIG 55 07/04/2018 10:42 AM   HDL 71 07/04/2018 10:42 AM   CHOLHDL 2.5 07/04/2018 10:42 AM   CHOLHDL 2.6 08/09/2016 10:16 AM   LDLCALC 92 07/04/2018 10:42 AM    Wt Readings from Last 3 Encounters:  11/16/19 300 lb (136.1 kg)  06/09/19 298 lb (135.2 kg)  04/20/19 291 lb (132 kg)     Objective:    Vital Signs:  BP 135/75   Pulse 70   Ht 5\' 3"  (1.6 m)   Wt 300 lb (136.1 kg)   BMI 53.14 kg/m    69 y.o. female in no acute distress.   ASSESSMENT & PLAN:    1.  Hypertension: -Her blood pressure is right at target. -No med changes, continue HCTZ  2.  History of PE/DVT on chronic anticoagulation -Lower extremity Dopplers done in 2020 showed possible residual DVT, continue anticoagulation -No bleeding issues  3.  Morbid obesity: -Patient says her weight had gotten up to 304 pounds last week, but is 300 pounds today -She is encouraged to eat healthy and increase activity as she is able  4. Atypical Chest pain: -No history of exertional symptoms - Symptoms seem to occur most often in the evening -She has been told that it is reflux, but has not tried taking additional meds, she remains on omeprazole, taking it daily. -Okay to take Mylanta or Gas-X as needed -All previous testing has been normal, do not feel that an ischemic evaluation is needed at this time but need to review the results from the test done by her her PCP to make sure    COVID-19 Education: The signs and symptoms of COVID-19 were discussed with the patient and how to seek care for testing (follow up with PCP or arrange E-visit).  The importance of social distancing was discussed today.  Patient Risk:   After full review of this patient's clinical status, I feel that they are at least moderate risk at this time.  Time:   Today, I have spent  16 minutes with the patient with telehealth technology discussing cardiac and other health issues.     Medication Adjustments/Labs and Tests Ordered: Current medicines are reviewed at length with the patient today.  Concerns regarding medicines are outlined above.  Tests Ordered: No orders of the defined types were placed in this encounter.  Medication Changes: No orders of the defined types were placed in this encounter.   Disposition:  Follow up with Minus Breeding, MD   Signed, Rosaria Ferries, PA-C  11/16/2019 10:52 AM    [place]

## 2019-11-26 DIAGNOSIS — K219 Gastro-esophageal reflux disease without esophagitis: Secondary | ICD-10-CM | POA: Diagnosis not present

## 2019-11-26 DIAGNOSIS — R1314 Dysphagia, pharyngoesophageal phase: Secondary | ICD-10-CM | POA: Diagnosis not present

## 2019-11-26 DIAGNOSIS — R070 Pain in throat: Secondary | ICD-10-CM | POA: Diagnosis not present

## 2019-12-01 ENCOUNTER — Other Ambulatory Visit: Payer: Self-pay | Admitting: Otolaryngology

## 2019-12-01 DIAGNOSIS — R1314 Dysphagia, pharyngoesophageal phase: Secondary | ICD-10-CM

## 2019-12-01 DIAGNOSIS — K219 Gastro-esophageal reflux disease without esophagitis: Secondary | ICD-10-CM

## 2019-12-11 ENCOUNTER — Other Ambulatory Visit: Payer: Self-pay | Admitting: Hematology and Oncology

## 2019-12-14 ENCOUNTER — Ambulatory Visit: Payer: Medicare Other | Admitting: Adult Health

## 2019-12-14 ENCOUNTER — Other Ambulatory Visit: Payer: Medicare Other

## 2019-12-15 DIAGNOSIS — E039 Hypothyroidism, unspecified: Secondary | ICD-10-CM | POA: Diagnosis not present

## 2019-12-15 DIAGNOSIS — K219 Gastro-esophageal reflux disease without esophagitis: Secondary | ICD-10-CM | POA: Diagnosis not present

## 2019-12-15 DIAGNOSIS — C50412 Malignant neoplasm of upper-outer quadrant of left female breast: Secondary | ICD-10-CM | POA: Diagnosis not present

## 2019-12-15 DIAGNOSIS — I25118 Atherosclerotic heart disease of native coronary artery with other forms of angina pectoris: Secondary | ICD-10-CM | POA: Diagnosis not present

## 2019-12-21 ENCOUNTER — Ambulatory Visit
Admission: RE | Admit: 2019-12-21 | Discharge: 2019-12-21 | Disposition: A | Discharge status: Home / Self Care | Payer: Medicare Other | Source: Ambulatory Visit | Attending: Otolaryngology | Admitting: Otolaryngology

## 2019-12-21 ENCOUNTER — Other Ambulatory Visit: Payer: Self-pay | Admitting: Otolaryngology

## 2019-12-21 DIAGNOSIS — K219 Gastro-esophageal reflux disease without esophagitis: Secondary | ICD-10-CM

## 2019-12-21 DIAGNOSIS — R1314 Dysphagia, pharyngoesophageal phase: Secondary | ICD-10-CM

## 2019-12-21 DIAGNOSIS — K224 Dyskinesia of esophagus: Secondary | ICD-10-CM | POA: Diagnosis not present

## 2019-12-21 IMAGING — RF DG ESOPHAGUS
9 of 11 series · 14 of 24 positions shown · non-contrast
Comparison: [DATE] chest CT angiogram.

CLINICAL DATA: Pharyngoesophageal dysphagia. Gastroesophageal
reflux disease. Food sticking in the chest.

EXAM:
ESOPHOGRAM/BARIUM SWALLOW
TECHNIQUE: Single contrast examination was performed using  thin barium.
FLUOROSCOPY TIME:  Fluoroscopy Time:  2 minutes 0 seconds
Number of Acquired Spot Images: 6

[Series 1: sequence · 2 of 53 frames shown (1 of 7)]
[frame 8/53]
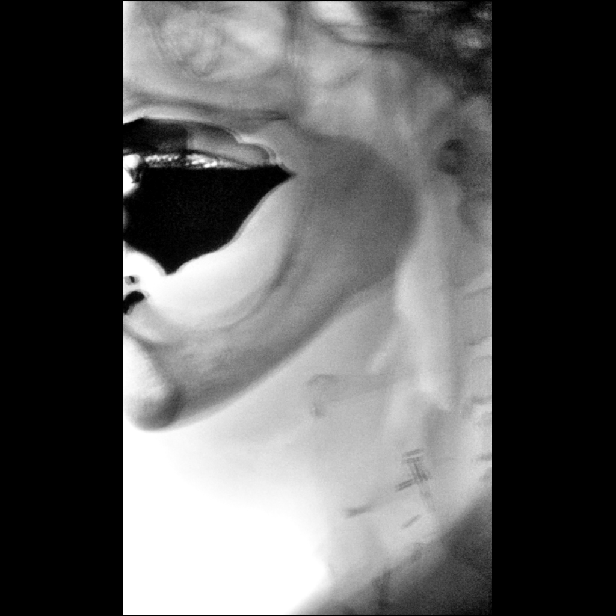
[frame 48/53]
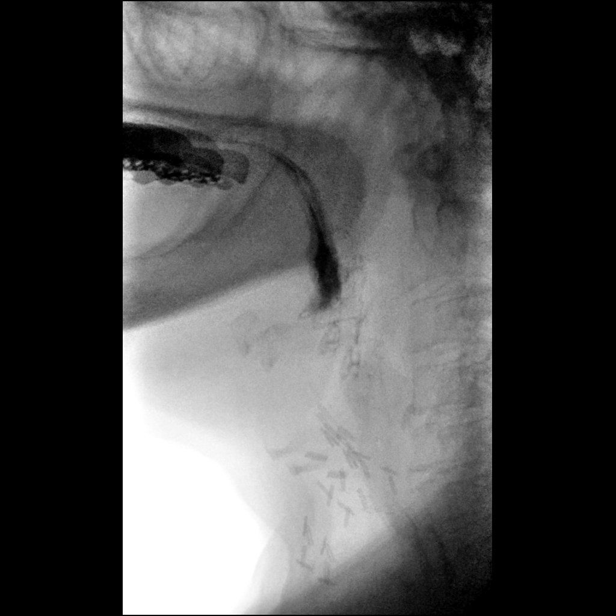

[Series 3: sequence · 1 of 34 frames shown (2 of 7)]
[frame 6/34]
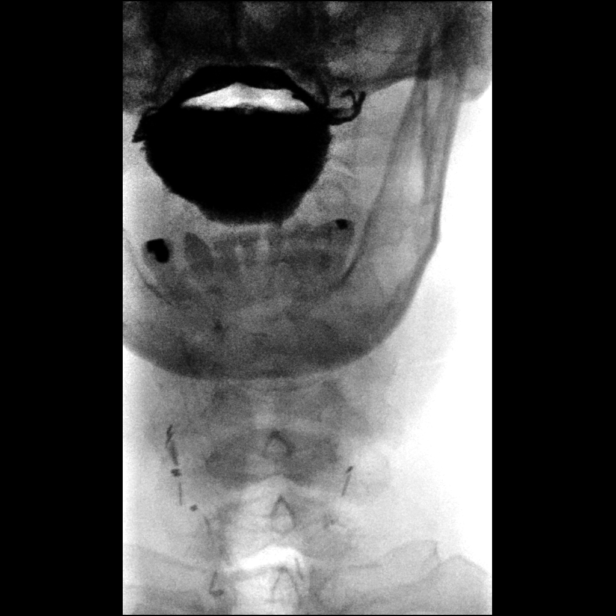

[Series 4: one shot · 0.15mm/px · 3 of 5 slices shown (1 of 2)]
[im 1/5]
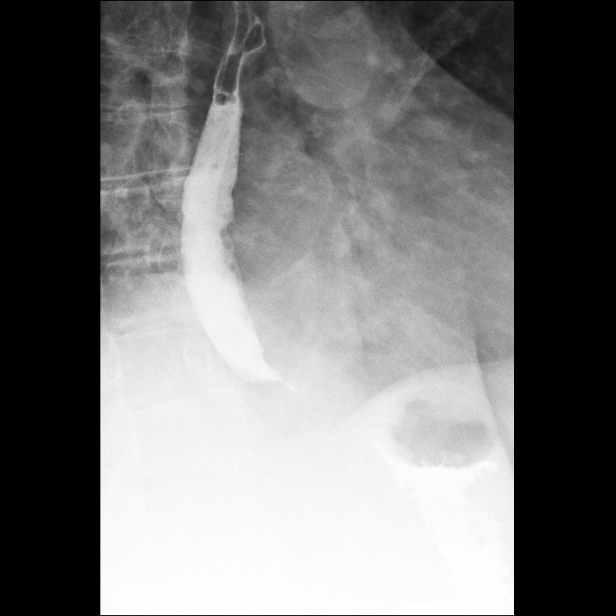
[im 2/5]
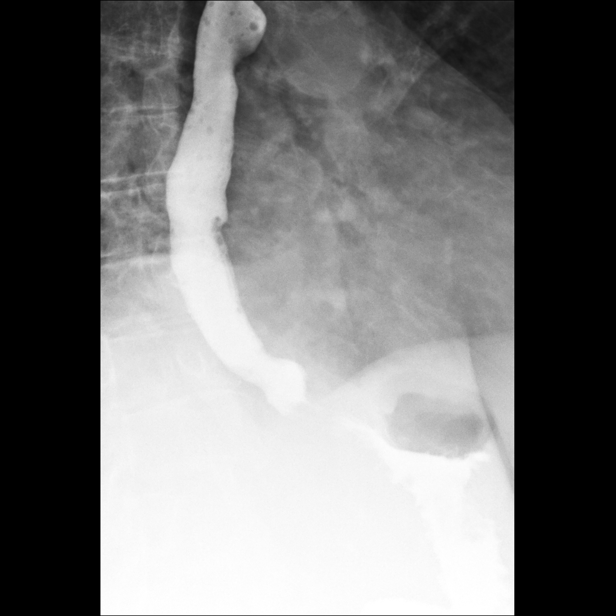
[im 5/5]
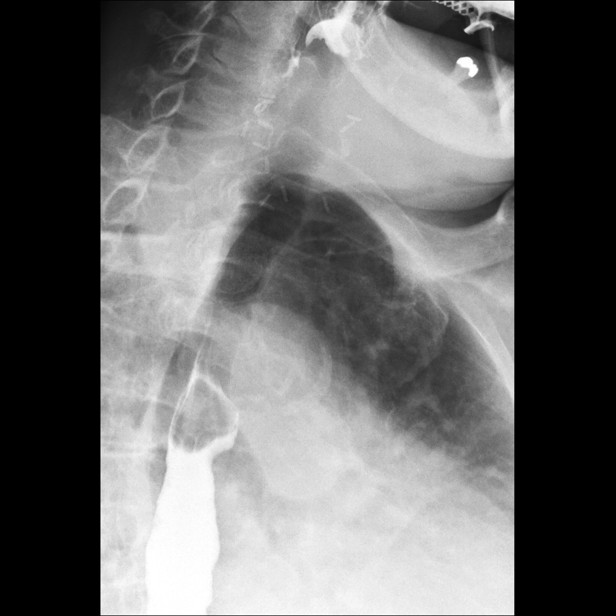

[Series 5: sequence · 1 of 27 frames shown (3 of 7)]
[frame 23/27]
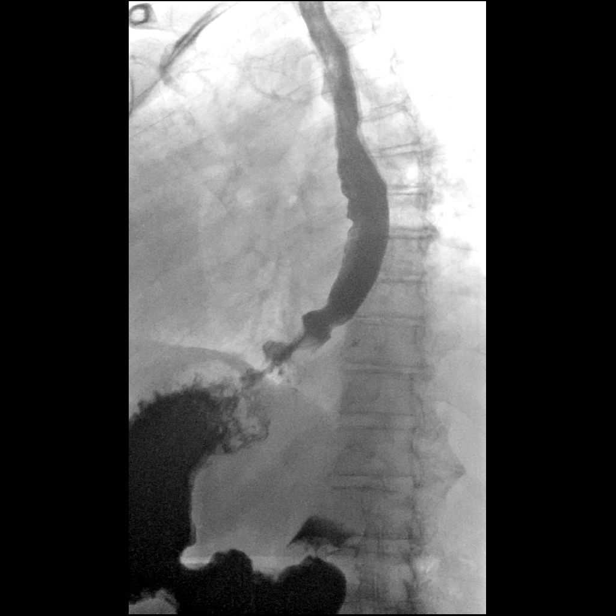

[Series 6: sequence · 2 of 54 frames shown (4 of 7)]
[frame 9/54]
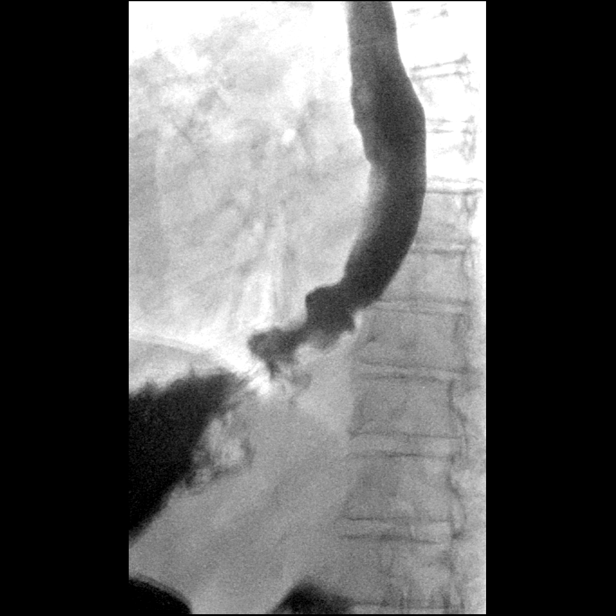
[frame 49/54]
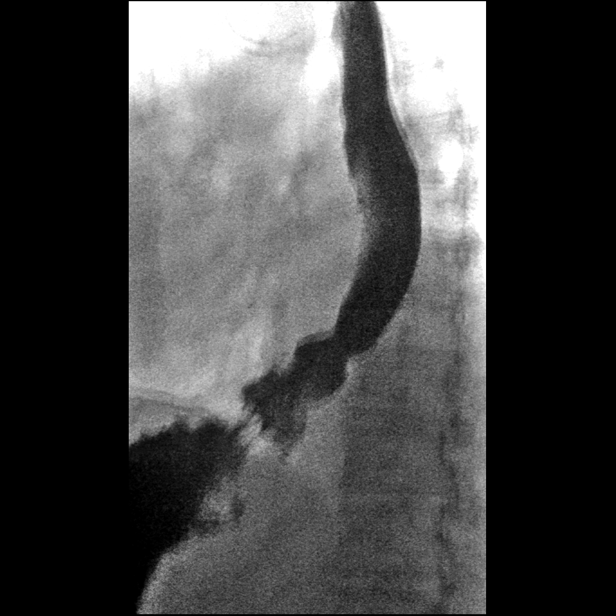

[Series 8: sequence · 1 of 8 frames shown (5 of 7)]
[frame 5/8]
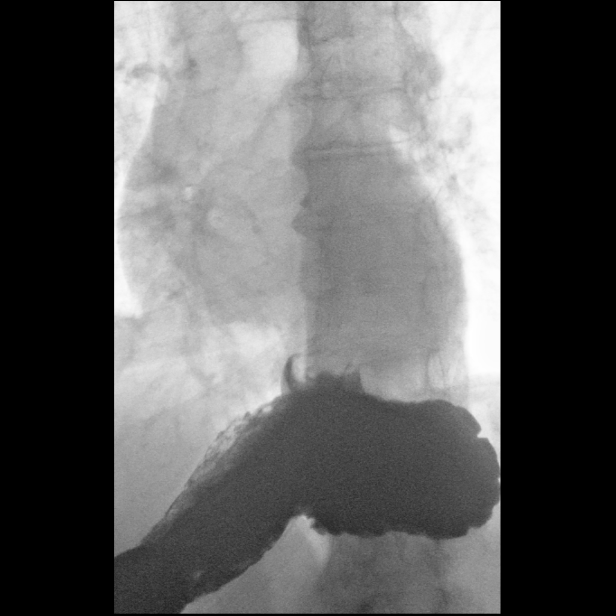

[Series 9: sequence · 2 of 8 frames shown (6 of 7)]
[frame 1/8]
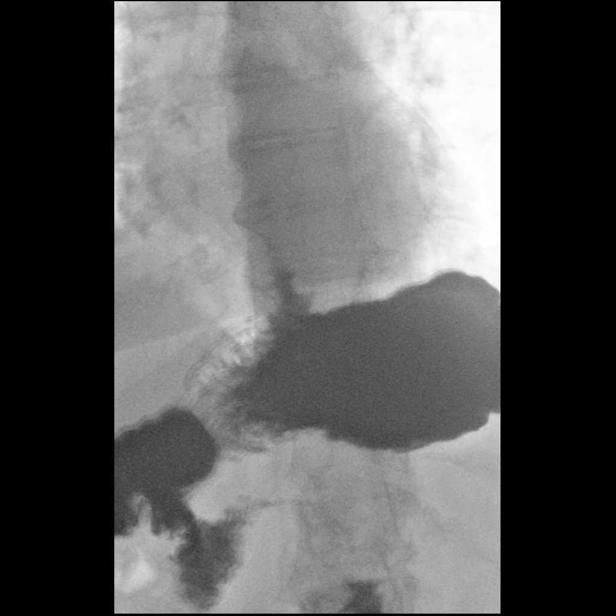
[frame 2/8]
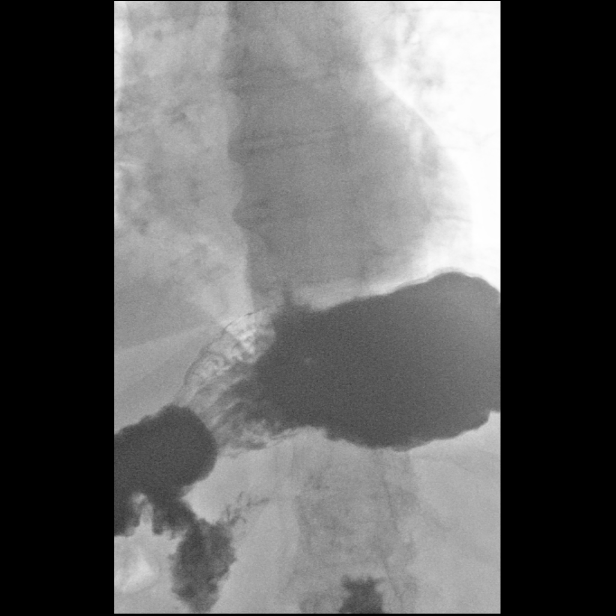

[Series 10: sequence · 1 of 34 frames shown (7 of 7)]
[frame 6/34]
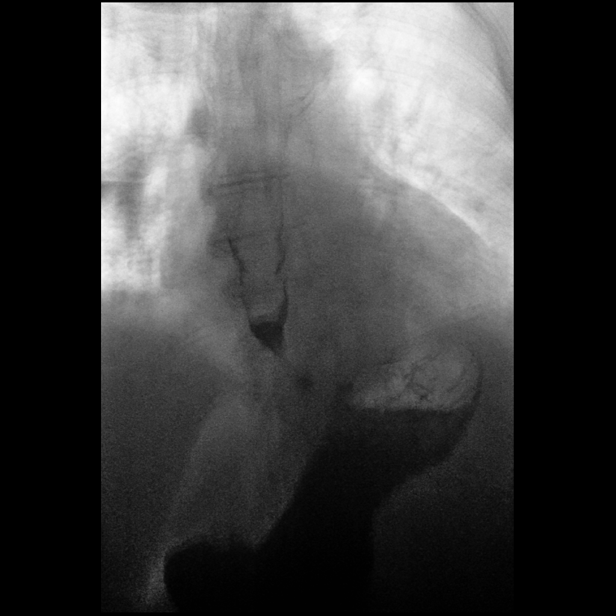

[Series 11: one shot · 1 of 1 slices shown (2 of 2)]
[im 1/1]
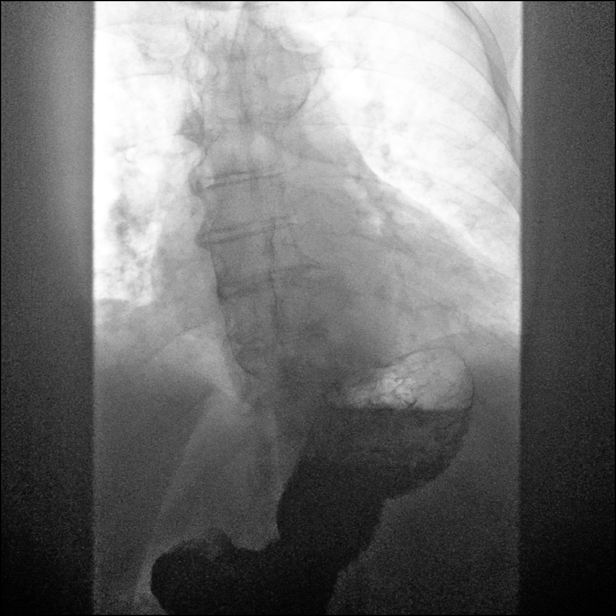

[14 of 24 positions shown; findings below may reference images not displayed]

FINDINGS: Normal oral and pharyngeal phases of swallowing, with no laryngeal
penetration or tracheobronchial aspiration. No significant barium
retention in the pharynx. No evidence of pharyngeal mass, stricture
or diverticulum. No evidence of cricopharyngeus muscle dysfunction.

Small hiatal hernia. Mild gastroesophageal reflux elicited to the
level of the lower thoracic esophagus with water siphon test. Mild
esophageal dysmotility, characterized by intermittent mild weakening
of primary peristalsis in the mid to lower thoracic esophagus.
Normal esophageal distensibility, with no evidence of esophageal
mass, stricture or ulcer. Barium tablet traversed the esophagus into
the stomach without delay.
IMPRESSION: 1. Normal oral and pharyngeal phases of swallowing, with no
laryngeal penetration or tracheobronchial aspiration.
2. Small hiatal hernia. Mild gastroesophageal reflux elicited.
3. Mild esophageal dysmotility, with a pattern characteristic of
chronic reflux related dysmotility.
4. No evidence of esophageal mass or stricture.

## 2020-01-06 NOTE — Progress Notes (Signed)
  HEMATOLOGY-ONCOLOGY TELEPHONE VISIT PROGRESS NOTE  I connected with '@PTNAME'$ @ on 01/07/20 at 11:30 AM EDT by telephone and verified that I am speaking with the correct person using two identifiers.  I discussed the limitations, risks, security and privacy concerns of performing an evaluation and management service by telephone and the availability of in person appointments.  I also discussed with the patient that there may be a patient responsible charge related to this service. The patient expressed understanding and agreed to proceed.   History of Present Illness: F/U on Letrozole Denies any pain or discomfort in breast.  Oncology History  Breast cancer of upper-outer quadrant of left female breast ([place])  01/27/2016 Initial Diagnosis   Screening mammogram: Left breast mass 9 mm, grade 1-2 IDC with DCIS, ER 100%, PR 90%, Ki-67 10%, HER-2 negative ratio 1.5, T1 BN 0 stage IA clinical stage   04/11/2016 Surgery   Left lumpectomy ([facility]): IDC grade 3, 1.2 cm, IG DCIS, LVI present, 0/8 lymph nodes negative, ER 100%, PR 90%, HER-2 negative, Ki-67 10%, T1 cN0 stage I a pathologic stage   06/18/2016 - 07/30/2016 Radiation Therapy   Adj XRT Isidore Moos): 1) Left Breast / 45 Gy in 25 fractions.  2) Left Breast Boost /  6 Gy in 3 fractions   08/29/2016 -  Anti-estrogen oral therapy   Anastrozole daily     REVIEW OF SYSTEMS:   Constitutional: Denies fevers, chills or abnormal weight loss Eyes: Denies blurriness of vision Ears, nose, mouth, throat, and face: Denies mucositis or sore throat Respiratory: Denies cough, dyspnea or wheezes Cardiovascular: Denies palpitation, chest discomfort Gastrointestinal:  Denies nausea, heartburn or change in bowel habits Skin: Denies abnormal skin rashes Lymphatics: Denies new lymphadenopathy or easy bruising Neurological:Denies numbness, tingling or new weaknesses Behavioral/Psych: Mood is stable, no new changes  Extremities: No lower extremity edema Breast:   denies any pain or lumps or nodules in either breasts All other systems were reviewed with the patient and are negative.     Assessment Plan:  Breast cancer of upper-outer quadrant of left female breast ([place]) Left lumpectomy 04/11/2016: IDC grade 3, 1.2 cm, IG DCIS, LVI present, 0/8 lymph nodes negative, ER 100%, PR 90%, HER-2 negative, Ki-67 10%, T1 cN0 stage I a pathologic stage  Adj XRT 06/18/16 to 07/30/16  Treatment Plan: Adj Anti-estrogen therapy with Letrozole 2.5 mg dailystarted 08/29/2016  Letrozole toxicities:Denies any hot flashes. Occasional lightheadedness Difficulty with ambulation and obesity and arthralgias: These are pre-existing before letrozole therapy. Patient assured me that she will lose a lot of weight when she sees me next year because she is walking and staying active.  Breast cancer surveillance: 1.Breast exam: Benign  2.mammogram   07/17/2019: Density category A, no evidence of breast cancer  Return to clinic in 1 year for follow-up    I discussed the assessment and treatment plan with the patient. The patient was provided an opportunity to ask questions and all were answered. The patient agreed with the plan and demonstrated an understanding of the instructions. The patient was advised to call back or seek an in-person evaluation if the symptoms worsen or if the condition fails to improve as anticipated.   I provided 12 minutes of non-face-to-face time during this encounter. Harriette Ohara, MD

## 2020-01-07 ENCOUNTER — Inpatient Hospital Stay: Payer: Medicare Other | Attending: Hematology and Oncology | Admitting: Hematology and Oncology

## 2020-01-07 DIAGNOSIS — C50412 Malignant neoplasm of upper-outer quadrant of left female breast: Secondary | ICD-10-CM | POA: Diagnosis not present

## 2020-01-07 DIAGNOSIS — Z170 Estrogen receptor positive status [ER+]: Secondary | ICD-10-CM | POA: Diagnosis not present

## 2020-01-07 NOTE — Assessment & Plan Note (Signed)
Left lumpectomy 04/11/2016: IDC grade 3, 1.2 cm, IG DCIS, LVI present, 0/8 lymph nodes negative, ER 100%, PR 90%, HER-2 negative, Ki-67 10%, T1 cN0 stage I a pathologic stage  Adj XRT 06/18/16 to 07/30/16  Treatment Plan: Adj Anti-estrogen therapy with Letrozole 2.5 mg dailystarted 08/29/2016  Letrozole toxicities:Denies any hot flashes. Occasional lightheadedness Difficulty with ambulation and obesity and arthralgias: These are pre-existing before letrozole therapy.  Breast cancer surveillance: 1.Breast exam 01/07/2020: Benign  2.mammogram   07/17/2019: Density category A, no evidence of breast cancer  Return to clinic in 1 year for follow-up

## 2020-02-03 DIAGNOSIS — R1314 Dysphagia, pharyngoesophageal phase: Secondary | ICD-10-CM | POA: Diagnosis not present

## 2020-02-03 DIAGNOSIS — K219 Gastro-esophageal reflux disease without esophagitis: Secondary | ICD-10-CM | POA: Diagnosis not present

## 2020-02-05 ENCOUNTER — Other Ambulatory Visit: Payer: Self-pay | Admitting: Endocrinology

## 2020-02-12 ENCOUNTER — Other Ambulatory Visit: Payer: Self-pay

## 2020-02-12 ENCOUNTER — Other Ambulatory Visit (INDEPENDENT_AMBULATORY_CARE_PROVIDER_SITE_OTHER): Payer: Medicare Other

## 2020-02-12 DIAGNOSIS — Z131 Encounter for screening for diabetes mellitus: Secondary | ICD-10-CM | POA: Diagnosis not present

## 2020-02-12 DIAGNOSIS — E890 Postprocedural hypothyroidism: Secondary | ICD-10-CM | POA: Diagnosis not present

## 2020-02-12 LAB — TSH: TSH: 1.33 u[IU]/mL (ref 0.35–4.50)

## 2020-02-12 LAB — T4, FREE: Free T4: 1.23 ng/dL (ref 0.60–1.60)

## 2020-02-12 LAB — GLUCOSE, RANDOM: Glucose, Bld: 107 mg/dL — ABNORMAL HIGH (ref 70–99)

## 2020-02-15 ENCOUNTER — Ambulatory Visit (INDEPENDENT_AMBULATORY_CARE_PROVIDER_SITE_OTHER): Payer: Medicare Other | Admitting: Endocrinology

## 2020-02-15 ENCOUNTER — Other Ambulatory Visit: Payer: Self-pay

## 2020-02-15 ENCOUNTER — Encounter: Payer: Self-pay | Admitting: Endocrinology

## 2020-02-15 DIAGNOSIS — E890 Postprocedural hypothyroidism: Secondary | ICD-10-CM | POA: Diagnosis not present

## 2020-02-15 DIAGNOSIS — R7301 Impaired fasting glucose: Secondary | ICD-10-CM

## 2020-02-15 NOTE — Progress Notes (Signed)
Patient ID: Sheila Jacobs, female   DOB: 28-Mar-1951, 69 y.o.   MRN: [id number]           Reason for Appointment: HYPOTHYROIDISM, follow-up   Today's office visit was provided via telemedicine using phone call Explained to the patient and the the limitations of evaluation and management by telemedicine and the availability of in person appointments.  The patient understood the limitations and agreed to proceed. Patient also understood that the telehealth visit is billable. . Location of the patient: Home . 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, mostly right sided  Because of her local pressure symptoms with choking feeling, coughing she was referred for thyroidectomy which was done in June 2018 This had relieved her local pressure symptoms  RECENT history:  She has been previously on 150 mcg of levothyroxine supplementation for her postsurgical hypothyroidism since about 08/2017 Because of her TSH being 0.22 in 12/2018 her dose was reduced to 137 mcg  She is complaining about cough at times with certain foods or if there is pepper in them She does have reflux diagnosed by the ENT physician and is on Protonix No difficulty swallowing  Overall she feels fairly good with her energy level and no recent fatigue She thinks she is gaining weight No heat or cold intolerance  She believes she has been quite regular with taking her generic Synthroid with water in the morning on empty stomach   TSH is now quite normal at  1.3  Wt Readings from Last 3 Encounters:  11/16/19 300 lb (136.1 kg)  06/09/19 298 lb (135.2 kg)  04/20/19 291 lb (132 kg)    Lab Results  Component Value Date   TSH 1.33 02/12/2020   TSH 0.73 08/18/2019   TSH 0.84 04/14/2019   FREET4 1.23 02/12/2020   FREET4 1.54 08/18/2019   FREET4 1.43 04/14/2019        Allergies as of 02/15/2020      Reactions   Cortisone Rash   Effexor [venlafaxine] Palpitations   Lexapro [escitalopram] Palpitations   Heart racing   Xarelto [rivaroxaban] Rash      Medication List       Accurate as of February 15, 2020 10:23 AM. If you have any questions, ask your nurse or doctor.        acetaminophen 500 MG tablet Commonly known as: TYLENOL Take 500 mg by mouth every 6 (six) hours as needed.   anastrozole 1 MG tablet Commonly known as: ARIMIDEX TAKE 1 TABLET BY MOUTH EVERY DAY   cyclobenzaprine 5 MG tablet Commonly known as: FLEXERIL Take 5 mg by mouth.   Eliquis 5 MG Tabs tablet Generic drug: apixaban TAKE 1 TABLET BY MOUTH TWICE A DAY   gabapentin 100 MG capsule Commonly known as: NEURONTIN Take 100 mg by mouth 3 (three) times daily.   hydrochlorothiazide 25 MG tablet Commonly known as: HYDRODIURIL Take 1 tablet (25 mg total) by mouth daily.   hydrOXYzine 25 MG capsule Commonly known as:  VISTARIL Take 25 mg by mouth 3 (three) times daily as needed.   ketoconazole 2 % cream Commonly known as: NIZORAL APPLY TOPICALLY EVERY DAY AS NEEDED FOR IRRITATION   levothyroxine 137 MCG tablet Commonly known as: SYNTHROID TAKE 1 TABLET (137 MCG TOTAL) BY MOUTH DAILY BEFORE BREAKFAST.   pantoprazole 40 MG tablet Commonly known as: PROTONIX Take 1 tablet (40 mg total) by mouth daily.   potassium chloride 10 MEQ tablet Commonly known as: KLOR-CON TAKE 3 TABLETS BY MOUTH THE FIRST 2 DAYS, THEN 1 TABKET ONCE A DAY       Allergies:  Allergies  Allergen Reactions  . Cortisone Rash  . Effexor [Venlafaxine] Palpitations  . Lexapro  [Escitalopram] Palpitations    Heart racing  . Xarelto [Rivaroxaban] Rash    Past Medical History:  Diagnosis Date  . Acute saddle pulmonary embolism without acute cor pulmonale ([place]) 12/29/2017  . Anginal pain ([place])   . Arthritis   . Breast cancer of upper-outer quadrant of left female breast ([place]) 01/31/2016  . Colon polyps   . GERD (gastroesophageal reflux disease)   . History of radiation therapy 10/9-11/20/17   Left Breast 45 Gy in 25 fractions, Left Breast Boost 6 Gy in 3 fractions.   . Hyperlipidemia   . Hypertension   . Morbid obesity ([place])   . OSA (obstructive sleep apnea) 01/23/2018  . Palpitations 10/16/2016  . Personal history of noncompliance with medical treatment, presenting hazards to health 08/27/2016  . Personal history of radiation therapy   . Thyroid goiter     Past Surgical History:  Procedure Laterality Date  . BREAST BIOPSY Left 01/2016  . BREAST LUMPECTOMY Left 04/2016  . BREAST LUMPECTOMY WITH RADIOACTIVE SEED AND SENTINEL LYMPH NODE BIOPSY Left 04/11/2016   Procedure: BREAST LUMPECTOMY WITH RADIOACTIVE SEED AND SENTINEL LYMPH NODE BIOPSY;  Surgeon: Excell Seltzer, MD;  Location: [facility];  Service: General;  Laterality: Left;  . COLONOSCOPY WITH PROPOFOL N/A 10/20/2015   Procedure: COLONOSCOPY WITH PROPOFOL;  Surgeon: Mauri Pole, MD;  Location: [facility];  Service: Endoscopy;  Laterality: N/A;  . ESOPHAGOGASTRODUODENOSCOPY (EGD) WITH PROPOFOL N/A 10/20/2015   Procedure: ESOPHAGOGASTRODUODENOSCOPY (EGD) WITH PROPOFOL;  Surgeon: Mauri Pole, MD;  Location: [facility];  Service: Endoscopy;  Laterality: N/A;  . IR ANGIOGRAM PULMONARY BILATERAL SELECTIVE  12/29/2017  . IR ANGIOGRAM SELECTIVE EACH ADDITIONAL VESSEL  12/29/2017  . IR ANGIOGRAM SELECTIVE EACH ADDITIONAL VESSEL  12/29/2017  . IR INFUSION THROMBOL ARTERIAL INITIAL (MS)  12/29/2017  . IR INFUSION THROMBOL ARTERIAL INITIAL (MS)  12/29/2017  . IR THROMB F/U EVAL ART/VEN FINAL DAY (MS)   12/30/2017  . IR US GUIDE VASC ACCESS RIGHT  12/29/2017  . LEFT HEART CATHETERIZATION WITH CORONARY ANGIOGRAM N/A 10/21/2014   Procedure: LEFT HEART CATHETERIZATION WITH CORONARY ANGIOGRAM;  Surgeon: Burnell Blanks, MD;  [facility];  Service: Cardiovascular;  Laterality: N/A;  . THYROIDECTOMY N/A 02/26/2017   Procedure: TOTAL THYROIDECTOMY;  Surgeon: Excell Seltzer, MD;  Location: [facility];  Service: General;  Laterality: N/A;  . TUBAL LIGATION      Family History  Problem Relation Age of Onset  . Hypertension Mother   . Heart disease Mother        Pacemaker  . Kidney disease Mother   . Cataracts Mother   . Hearing loss Mother   . Diabetes Mother   . Liver disease Mother   . Diabetes Sister   . Cataracts Brother   . Hearing loss  Brother   . Colon cancer Neg Hx   . Thyroid disease Neg Hx     Social History:  reports that she quit smoking about 5 years ago. Her smoking use included cigarettes. She has a 45.00 pack-year smoking history. She has never used smokeless tobacco. She reports that she does not drink alcohol or use drugs.    Review of Systems   Hypertension, treated by PCP with hydrochlorothiazide   BP Readings from Last 3 Encounters:  11/16/19 135/75  06/09/19 135/60  10/22/18 140/70   She thinks she is gaining weight from snacking and her fasting glucose was high at 107  Lab Results  Component Value Date   HGBA1C 5.1 12/29/2017   HGBA1C 5.3 04/24/2016   HGBA1C 4.9% 05/03/2015   Lab Results  Component Value Date   LDLCALC 92 07/04/2018   CREATININE 0.85 09/09/2018      Examination:   There were no vitals taken for this visit.            Assessment/Plan:  Postsurgical hypothyroidism  She is stable with taking 137 mcg levothyroxine, last dosage change was in April 2020  She is not complaining of any hypothyroid symptoms She has nonspecific symptoms of occasional cough Again reassured her that her symptoms of neck discomfort  are unrelated to the levothyroxine and she is getting adequate amounts of medication  TSH is consistently normal at 1.3  Discussed that she needs to follow-up with her PCP or ENT surgeon regarding occasional cough Reminded her to take her Synthroid on empty stomach every morning   She should have an A1c checked because of high fasting glucose Discussed need for weight loss and improved insulin sensitivity to prevent diabetes  Follow-up in 6 months     Twila Rappa Dwyane Dee 02/15/2020  Duration of telephone encounter = 7 minutes

## 2020-03-03 ENCOUNTER — Other Ambulatory Visit: Payer: Self-pay | Admitting: Endocrinology

## 2020-03-09 ENCOUNTER — Other Ambulatory Visit: Payer: Self-pay | Admitting: Cardiology

## 2020-03-09 DIAGNOSIS — D649 Anemia, unspecified: Secondary | ICD-10-CM | POA: Diagnosis not present

## 2020-03-09 DIAGNOSIS — Z0001 Encounter for general adult medical examination with abnormal findings: Secondary | ICD-10-CM | POA: Diagnosis not present

## 2020-03-09 DIAGNOSIS — Z79899 Other long term (current) drug therapy: Secondary | ICD-10-CM | POA: Diagnosis not present

## 2020-03-10 ENCOUNTER — Telehealth: Payer: Self-pay | Admitting: Cardiology

## 2020-03-10 NOTE — Telephone Encounter (Signed)
Spoke to pt who report since starting metoprolol she's been experiencing occasionally CP that feels like a sharp needle stick. She report symptoms is very brief but occurs roughly 3 hours after taking mediation. She also voiced that metoprolol makes her feel like her heart is going to stop and describes it as a quick flutter sensation. Symptoms isn't brought on by exertion  but just comes and go while sitting. Pt voiced she only took 1/2 metoprolol todayto see if symptoms are related to medication.  Will route to MD

## 2020-03-10 NOTE — Telephone Encounter (Signed)
Tell her to let us know how she does on the half dose.

## 2020-03-10 NOTE — Telephone Encounter (Signed)
    Pt c/o medication issue:  1. Name of Medication: metoprolol 50 mg  2. How are you currently taking this medication (dosage and times per day)? Once a day  3. Are you having a reaction (difficulty breathing--STAT)?   4. What is your medication issue?   Mateo Flow Nurse from [employer] [facility] [address] [place] pt's pcp called, she said per pt having reaction with this medication, pt is having chest discomfort. She said she stopped taking her acid reflux medication to see hats causing her chest discomfort and turns out it is from metoprolol. valerie said to follow up with pt.

## 2020-03-15 NOTE — Telephone Encounter (Signed)
Spoke with patient of Dr. [name] calling w/update on metoprolol. She reports when she takes even a half-dose of medication that her feet hurt on bottom and she has chest pain. She has not taken medication today and "is just as calm as ever". She states her PCP started her on this medication after doing an EKG. Explained we do not have access to those records to review.   Will send to MD/RN as update

## 2020-03-16 NOTE — Telephone Encounter (Signed)
If we were not the MDs who prescribed the beta blocker please refer her back to the prescribing MD.  Sounds like she might need an in office appt with Suanne Marker.

## 2020-03-23 NOTE — Telephone Encounter (Signed)
Left message for pt to call.

## 2020-03-26 ENCOUNTER — Other Ambulatory Visit: Payer: Self-pay | Admitting: Endocrinology

## 2020-04-15 ENCOUNTER — Ambulatory Visit: Payer: Medicare Other

## 2020-04-26 ENCOUNTER — Other Ambulatory Visit: Payer: Self-pay

## 2020-04-26 ENCOUNTER — Other Ambulatory Visit: Payer: Medicare Other

## 2020-04-26 DIAGNOSIS — Z20822 Contact with and (suspected) exposure to covid-19: Secondary | ICD-10-CM

## 2020-04-28 ENCOUNTER — Telehealth: Payer: Self-pay | Admitting: General Practice

## 2020-04-28 LAB — NOVEL CORONAVIRUS, NAA

## 2020-04-28 NOTE — Telephone Encounter (Signed)
Gave patient negative covid test results Patient understood 

## 2020-04-29 ENCOUNTER — Other Ambulatory Visit: Payer: Medicare Other

## 2020-04-29 ENCOUNTER — Ambulatory Visit: Payer: Medicare Other

## 2020-05-12 ENCOUNTER — Other Ambulatory Visit: Payer: Self-pay | Admitting: Endocrinology

## 2020-06-10 ENCOUNTER — Other Ambulatory Visit: Payer: Self-pay

## 2020-06-10 ENCOUNTER — Ambulatory Visit: Payer: Medicare Other | Admitting: Physical Therapy

## 2020-06-10 DIAGNOSIS — H9190 Unspecified hearing loss, unspecified ear: Secondary | ICD-10-CM | POA: Diagnosis not present

## 2020-06-10 DIAGNOSIS — C50412 Malignant neoplasm of upper-outer quadrant of left female breast: Secondary | ICD-10-CM | POA: Diagnosis not present

## 2020-06-10 DIAGNOSIS — Z23 Encounter for immunization: Secondary | ICD-10-CM | POA: Diagnosis not present

## 2020-06-10 DIAGNOSIS — I890 Lymphedema, not elsewhere classified: Secondary | ICD-10-CM | POA: Diagnosis not present

## 2020-06-17 ENCOUNTER — Ambulatory Visit: Payer: Medicare Other | Attending: General Practice

## 2020-06-17 ENCOUNTER — Other Ambulatory Visit: Payer: Self-pay

## 2020-06-17 DIAGNOSIS — I890 Lymphedema, not elsewhere classified: Secondary | ICD-10-CM | POA: Diagnosis not present

## 2020-06-17 DIAGNOSIS — R2689 Other abnormalities of gait and mobility: Secondary | ICD-10-CM

## 2020-06-17 DIAGNOSIS — G4733 Obstructive sleep apnea (adult) (pediatric): Secondary | ICD-10-CM | POA: Diagnosis not present

## 2020-06-17 NOTE — Therapy (Signed)
[facility] [address] [place], [place], [address] Phone: [phone]   Fax:  [phone]  Physical Therapy Evaluation  Patient Details  Name: Sheila Jacobs MRN: [id number] Date of Birth: 05-Sep-1951 Referring Provider (PT): Karl Ito   Encounter Date: 06/17/2020   PT End of Session - 06/17/20 1239    Visit Number 1    Number of Visits 13   Date for PT Re-Evaluation 07/29/20    PT Start Time 0915    PT Stop Time 1010    PT Time Calculation (min) 55 min    Activity Tolerance Patient tolerated treatment well    Behavior During Therapy [facility] for tasks assessed/performed           Past Medical History:  Diagnosis Date  . Acute saddle pulmonary embolism without acute cor pulmonale (HCC) 12/29/2017  . Anginal pain (HCC)   . Arthritis   . Breast cancer of upper-outer quadrant of left female breast (HCC) 01/31/2016  . Colon polyps   . GERD (gastroesophageal reflux disease)   . History of radiation therapy 10/9-11/20/17   Left Breast 45 Gy in 25 fractions, Left Breast Boost 6 Gy in 3 fractions.   . Hyperlipidemia   . Hypertension   . Morbid obesity (HCC)   . OSA (obstructive sleep apnea) 01/23/2018  . Palpitations 10/16/2016  . Personal history of noncompliance with medical treatment, presenting hazards to health 08/27/2016  . Personal history of radiation therapy   . Thyroid goiter     Past Surgical History:  Procedure Laterality Date  . BREAST BIOPSY Left 01/2016  . BREAST LUMPECTOMY Left 04/2016  . BREAST LUMPECTOMY WITH RADIOACTIVE SEED AND SENTINEL LYMPH NODE BIOPSY Left 04/11/2016   Procedure: BREAST LUMPECTOMY WITH RADIOACTIVE SEED AND SENTINEL LYMPH NODE BIOPSY;  Surgeon: Glenna Fellows, MD;  Location: [facility];  Service: General;  Laterality: Left;  . COLONOSCOPY WITH PROPOFOL N/A 10/20/2015   Procedure: COLONOSCOPY WITH PROPOFOL;  Surgeon: Napoleon Form, MD;  Location: [facility];  Service: Endoscopy;   Laterality: N/A;  . ESOPHAGOGASTRODUODENOSCOPY (EGD) WITH PROPOFOL N/A 10/20/2015   Procedure: ESOPHAGOGASTRODUODENOSCOPY (EGD) WITH PROPOFOL;  Surgeon: Napoleon Form, MD;  Location: [facility];  Service: Endoscopy;  Laterality: N/A;  . IR ANGIOGRAM PULMONARY BILATERAL SELECTIVE  12/29/2017  . IR ANGIOGRAM SELECTIVE EACH ADDITIONAL VESSEL  12/29/2017  . IR ANGIOGRAM SELECTIVE EACH ADDITIONAL VESSEL  12/29/2017  . IR INFUSION THROMBOL ARTERIAL INITIAL (MS)  12/29/2017  . IR INFUSION THROMBOL ARTERIAL INITIAL (MS)  12/29/2017  . IR THROMB F/U EVAL ART/VEN FINAL DAY (MS)  12/30/2017  . IR US GUIDE VASC ACCESS RIGHT  12/29/2017  . LEFT HEART CATHETERIZATION WITH CORONARY ANGIOGRAM N/A 10/21/2014   Procedure: LEFT HEART CATHETERIZATION WITH CORONARY ANGIOGRAM;  Surgeon: Kathleene Hazel, MD;  Location: [facility] CATH LAB;  Service: Cardiovascular;  Laterality: N/A;  . THYROIDECTOMY N/A 02/26/2017   Procedure: TOTAL THYROIDECTOMY;  Surgeon: Glenna Fellows, MD;  Location: [facility];  Service: General;  Laterality: N/A;  . TUBAL LIGATION      There were no vitals filed for this visit.    Subjective Assessment - 06/17/20 0925    Subjective Ordered a pair of knee high stockings that she ordered herself but they tore up. Measured several times for  custom stockings(circaid) last year but insurance would not cover and she could not afford. Here tofind out if there are other options.  Really just wants [place] to measure her for stockings she can get  at Dr[employer] Lowry [employer] catalog.  Her legs are more swollen because she has been sleeping sitting up, and she has not been wearing stockings.    Pertinent History Pt is not wearing her hearing aids and has significant difficulty hearing today.  She lives alone, but sees her daughter who lives nearby frequently.  Prefers limited visits.  Has had lymphedema for approx 2 years. Is on chronic Eliquis.  See Medical History    Limitations Walking;House hold activities    How long  can you walk comfortably? short distances only and with [facility]    Patient Stated Goals Want to get measured so she can order new stockings herself from Dr. Hollice Espy or another catalog    Currently in Pain? No/denies   occasional pain in right knee secondary to OA   Pain Descriptors / Indicators Aching    Aggravating Factors  sitting too long legs swell, walking bothers right knee              [facility] PT Assessment - 06/17/20 0001      Assessment   Medical Diagnosis bilateral LE lymphedema    Referring Provider (PT) Andree Moro    Onset Date/Surgical Date 04/17/18    Prior Therapy yes 2020      Precautions   Precautions Other (comment)    Precaution Comments infection risk      Balance Screen   Has the patient had a decrease in activity level because of a fear of falling?  Yes      [place] residence    Available Help at Discharge Family    Type of Home Apartment    Home Access Elevator      Cognition   Overall Cognitive Status Difficult to assess    Difficult to assess due to Impaired communication    Attention --   hearing loss     Observation/Other Assessments   Skin Integrity intact, thickened      Observation/Other Assessments-Edema    Edema --   mild pitting     Ambulation/Gait   Assistive device Straight cane             LYMPHEDEMA/ONCOLOGY QUESTIONNAIRE - 06/17/20 0001      Surgeries   Lumpectomy Date 04/11/16      Treatment   Active Chemotherapy Treatment No    Past Radiation Treatment Yes      What other symptoms do you have   Are you Having Heaviness or Tightness Yes    Are you having pitting edema Yes   mild   Body Site both legs    Is it Hard or Difficult finding clothes that fit Yes      Lymphedema Stage   Stage STAGE 2 SPONTANEOUSLY IRREVERSIBLE      Lymphedema Assessments   Lymphedema Assessments Lower extremities      Right Lower Extremity Lymphedema   Around Proximal Great Toe 10.4 cm    Other  length 39.5 (floor)    Other calf 68    Other ankle 45 cm      Left Lower Extremity Lymphedema   Other length 39.5 floor    Other calf 64 cm    Other ankle 42 cm                   Outpatient Rehab from 04/22/[facility]  Lymphedema Life Impact Scale Total Score 20.59 %      Objective measurements completed on  examination: See above findings.               PT Education - 06/17/20 1054    Education Details Pt was educated about the need for a garmet such as a circaid reduction kit secondary to the amount of swelling especially in her right leg.  She was advised it would feel better, be easier to don and doff. Gave info for size wide standard length lower leg reduction kit and Circaid PAC band for the foot and information on [employer] to order.She is very concerned about the cost, but will try to get atleast for right leg.  She continued to indicate that she just wants regular stockings but explained they will slide to the grooves in her legs and contrict.    Person(s) Educated Patient    Methods Explanation;Handout    Comprehension Verbalized understanding;Need further instruction   seems to understand but really wants regular compression stockings which are not really appropriate for her              PT Long Term Goals - 06/17/20 1254      PT LONG TERM GOAL #1   Title Pt will be independent with self compression and lymphedema care at home    Time 6    Period Weeks    Status New      PT LONG TERM GOAL #2   Title Pt will receive appropriate compression garments for long term management of lymphedema.    Time 6    Period Weeks    Status New      PT LONG TERM GOAL #3   Title Pt will decrease lower leg circumference by at least 2 cm at ankle and calf    Period Weeks    Status New                  Plan - 06/17/20 1241    Clinical Impression Statement Pt. presents with significant bilateral lower  extremity lymphedema present for 2 years and worsening recently.  Lymphedema likely from CVI and obesity.  She has extensive medical hx and is on chronic Eliquis.  She would benefit from CDP treatment however expressed difficulty with getting here regularly and really just wants to be measured for stockings.  She was measured and given info on knee high circaid reduction kit with PAC band for foot especially for the right LE.  She was advised they will last longer and will not slide into the grooves on her like as a regular stocking.  Her measurements are outside of an off the shelf stocking.  She still implied she would like to get stockings through a mail order company.  Evaluation was difficult secondary to pt being very hard of hearing and she did not have her hearing aids on today.    Personal Factors and Comorbidities Age;Comorbidity 1;Fitness;Comorbidity 3+;Finances;Transportation    Comorbidities Obesity, breast CA, chronic Eliquis for PE    Examination-Activity Limitations Stairs    Examination-Participation Restrictions Personal Finances    Stability/Clinical Decision Making Stable/Uncomplicated    Clinical Decision Making Low    Rehab Potential Fair    PT Frequency 2x / week    PT Duration 6 weeks    PT Treatment/Interventions Therapeutic exercise;Patient/family education;Orthotic Fit/Training;Manual techniques;Manual lymph drainage;Compression bandaging;ADLs/Self Care Home Management    PT Next Visit Plan Pt to call to schedule next visit if she orders circaids.  Fit to pt and instruct on donning and doffing, lymphedema precautions, Self  MLD prn    Consulted and Agree with Plan of Care Patient           Patient will benefit from skilled therapeutic intervention in order to improve the following deficits and impairments:  Abnormal gait, Decreased mobility, Increased edema, Difficulty walking, Decreased knowledge of precautions  Visit Diagnosis: Lymphedema, not elsewhere  classified  Other abnormalities of gait and mobility     Problem List Patient Active Problem List   Diagnosis Date Noted  . Medication management 10/22/2018  . History of pulmonary embolus (PE), 12/2017 10/22/2018  . Chronic anticoagulation 10/22/2018  . Tinea corporis 08/22/2018  . Right leg swelling 08/21/2018  . Pain and swelling of ankle, right 04/24/2018  . OSA (obstructive sleep apnea) 01/23/2018  . Hypothyroidism   . Hypoxia   . Acute saddle pulmonary embolism without acute cor pulmonale ([place]) 12/29/2017  . History of breast cancer 10/23/2017  . Post-surgical hypothyroidism 06/27/2017  . Thyroid goiter 02/26/2017  . Personal history of noncompliance with medical treatment, presenting hazards to health 08/27/2016  . Generalized anxiety disorder 08/09/2016  . Abnormal TSH 05/09/2016  . Abnormality of gait 01/31/2016  . Low back pain 01/31/2016  . Breast cancer of upper-outer quadrant of left female breast ([place]) 01/31/2016  . Former heavy cigarette smoker (20-39 per day) 11/08/2015  . Esophageal reflux   . GERD (gastroesophageal reflux disease) 05/03/2015  . Dependent edema 05/03/2015  . Morbid obesity ([place]) 04/29/2015  . Right carotid bruit 10/06/2014  . Atypical chest pain 09/18/2014  . Hypertension 09/18/2014  . Hyperlipidemia 09/18/2014  . Enlarged LA (left atrium)     Claris Pong, PT 06/17/2020, 1:12 PM  [place], [place], [address] Phone: [phone]   Fax:  [phone]  Name: Sheila Jacobs MRN: [id number] Date of Birth: [birth year]

## 2020-06-17 NOTE — Progress Notes (Signed)
Seemed to comprehend but does not seem to want the circaid despite our good intentions secondary to cost.

## 2020-06-28 ENCOUNTER — Other Ambulatory Visit: Payer: Self-pay | Admitting: *Deleted

## 2020-06-28 DIAGNOSIS — N63 Unspecified lump in unspecified breast: Secondary | ICD-10-CM

## 2020-06-28 DIAGNOSIS — Z170 Estrogen receptor positive status [ER+]: Secondary | ICD-10-CM

## 2020-07-01 ENCOUNTER — Ambulatory Visit: Payer: Medicare Other | Admitting: Cardiology

## 2020-07-04 ENCOUNTER — Other Ambulatory Visit: Payer: Medicare Other

## 2020-07-12 NOTE — Progress Notes (Deleted)
Cardiology Office Note   Date:  07/12/2020   ID:  Sheila Jacobs, DOB July 11, 1951, MRN [id number]  PCP:  [name], FNP (Inactive)  Cardiologist:   Minus Breeding, MD   No chief complaint on file.     History of Present Illness: Sheila Jacobs is a 69 y.o. female who presents for follow up of pulmonary embolism.  She was admitted in April 2019 with this.  She has also been diagnosed with sleep apnea .  Since I last saw her ***   *** ast year.  Since I last saw her she was in the clinic with atypical chest pain and was managed medically.  Today she returns for follow-up.  She complains of her knee hurting her.  She is not complaining of any cardiac issues.  She walks around [place] and does some other household chores.  She complains of pain in the bottom of her feet.  She is not had any palpitations, presyncope or syncope.  She is not had any new shortness of breath, PND or orthopnea.  Said no weight gain or edema.   Past Medical History:  Diagnosis Date  . Acute saddle pulmonary embolism without acute cor pulmonale ([place]) 12/29/2017  . Anginal pain ([place])   . Arthritis   . Breast cancer of upper-outer quadrant of left female breast ([place]) 01/31/2016  . Colon polyps   . GERD (gastroesophageal reflux disease)   . History of radiation therapy 10/9-11/20/17   Left Breast 45 Gy in 25 fractions, Left Breast Boost 6 Gy in 3 fractions.   . Hyperlipidemia   . Hypertension   . Morbid obesity ([place])   . OSA (obstructive sleep apnea) 01/23/2018  . Palpitations 10/16/2016  . Personal history of noncompliance with medical treatment, presenting hazards to health 08/27/2016  . Personal history of radiation therapy   . Thyroid goiter     Past Surgical History:  Procedure Laterality Date  . BREAST BIOPSY Left 01/2016  . BREAST LUMPECTOMY Left 04/2016  . BREAST LUMPECTOMY WITH RADIOACTIVE SEED AND SENTINEL LYMPH NODE BIOPSY Left 04/11/2016   Procedure: BREAST LUMPECTOMY WITH RADIOACTIVE  SEED AND SENTINEL LYMPH NODE BIOPSY;  Surgeon: Excell Seltzer, MD;  Location: [place];  Service: General;  Laterality: Left;  . COLONOSCOPY WITH PROPOFOL N/A 10/20/2015   Procedure: COLONOSCOPY WITH PROPOFOL;  Surgeon: Mauri Pole, MD;  [facility];  Service: Endoscopy;  Laterality: N/A;  . ESOPHAGOGASTRODUODENOSCOPY (EGD) WITH PROPOFOL N/A 10/20/2015   Procedure: ESOPHAGOGASTRODUODENOSCOPY (EGD) WITH PROPOFOL;  Surgeon: Mauri Pole, MD;  [facility]: [facility];  Service: Endoscopy;  Laterality: N/A;  . IR ANGIOGRAM PULMONARY BILATERAL SELECTIVE  12/29/2017  . IR ANGIOGRAM SELECTIVE EACH ADDITIONAL VESSEL  12/29/2017  . IR ANGIOGRAM SELECTIVE EACH ADDITIONAL VESSEL  12/29/2017  . IR INFUSION THROMBOL ARTERIAL INITIAL (MS)  12/29/2017  . IR INFUSION THROMBOL ARTERIAL INITIAL (MS)  12/29/2017  . IR THROMB F/U EVAL ART/VEN FINAL DAY (MS)  12/30/2017  . IR US GUIDE VASC ACCESS RIGHT  12/29/2017  . LEFT HEART CATHETERIZATION WITH CORONARY ANGIOGRAM N/A 10/21/2014   Procedure: LEFT HEART CATHETERIZATION WITH CORONARY ANGIOGRAM;  Surgeon: Burnell Blanks, MD;  Location: [facility] CATH LAB;  Service: Cardiovascular;  Laterality: N/A;  . THYROIDECTOMY N/A 02/26/2017   Procedure: TOTAL THYROIDECTOMY;  Surgeon: Excell Seltzer, MD;  [facility]: [facility];  Service: General;  Laterality: N/A;  . TUBAL LIGATION       Current Outpatient Medications  Medication Sig Dispense Refill  .  acetaminophen (TYLENOL) 500 MG tablet Take 500 mg by mouth every 6 (six) hours as needed.    Marland Kitchen anastrozole (ARIMIDEX) 1 MG tablet TAKE 1 TABLET BY MOUTH EVERY DAY 90 tablet 3  . cyclobenzaprine (FLEXERIL) 5 MG tablet Take 5 mg by mouth. (Patient not taking: Reported on 06/17/2020)    . ELIQUIS 5 MG TABS tablet TAKE 1 TABLET BY MOUTH TWICE A DAY 180 tablet 2  . hydrochlorothiazide (HYDRODIURIL) 25 MG tablet Take 1 tablet (25 mg total) by mouth daily. 90 tablet 3  . hydrOXYzine (VISTARIL) 25 MG capsule Take 25 mg by  mouth 3 (three) times daily as needed. (Patient not taking: Reported on 06/17/2020)    . ketoconazole (NIZORAL) 2 % cream APPLY TOPICALLY EVERY DAY AS NEEDED FOR IRRITATION 30 g 1  . levothyroxine (SYNTHROID) 137 MCG tablet TAKE 1 TABLET (137 MCG TOTAL) BY MOUTH DAILY BEFORE BREAKFAST. 90 tablet 0  . omeprazole (PRILOSEC) 20 MG capsule Take 20 mg by mouth in the morning and at bedtime.    . potassium chloride (K-DUR) 10 MEQ tablet TAKE 3 TABLETS BY MOUTH THE FIRST 2 DAYS, THEN 1 TABKET ONCE A DAY 35 tablet 0   No current facility-administered medications for this visit.    Allergies:   Cortisone, Effexor [venlafaxine], Lexapro [escitalopram], and Xarelto [rivaroxaban]     ROS:  Please see the history of present illness.   Otherwise, review of systems are positive for ***.   All other systems are reviewed and negative.    PHYSICAL EXAM: VS:  There were no vitals taken for this visit. , BMI There is no height or weight on file to calculate BMI. GENERAL:  Well appearing NECK:  No jugular venous distention, waveform within normal limits, carotid upstroke brisk and symmetric, no bruits, no thyromegaly LUNGS:  Clear to auscultation bilaterally CHEST:  Unremarkable HEART:  PMI not displaced or sustained,S1 and S2 within normal limits, no S3, no S4, no clicks, no rubs, *** murmurs ABD:  Flat, positive bowel sounds normal in frequency in pitch, no bruits, no rebound, no guarding, no midline pulsatile mass, no hepatomegaly, no splenomegaly EXT:  2 plus pulses throughout, no edema, no cyanosis no clubbing    ***GENERAL:  Well appearing HEENT:  Poor dentition   NECK:  No jugular venous distention, waveform within normal limits, carotid upstroke brisk and symmetric, no bruits, no thyromegaly LUNGS:  Clear to auscultation bilaterally CHEST:  Unremarkable HEART:  PMI not displaced or sustained,S1 and S2 within normal limits, no S3, no S4, no clicks, no rubs, no murmurs ABD:  Flat, positive bowel  sounds normal in frequency in pitch, no bruits, no rebound, no guarding, no midline pulsatile mass, no hepatomegaly, no splenomegaly EXT:  2 plus pulses throughout, no edema, no cyanosis no clubbing   EKG:  EKG is *** ordered today. The ekg ordered 09/11/18 demonstrates sinus rhythm, rate ***, axis within normal limits, intervals within normal limits, resolution of the anterior T wave inversion   Recent Labs: 02/12/2020: TSH 1.33    Lipid Panel    Component Value Date/Time   CHOL 174 07/04/2018 1042   TRIG 55 07/04/2018 1042   HDL 71 07/04/2018 1042   CHOLHDL 2.5 07/04/2018 1042   CHOLHDL 2.6 08/09/2016 1016   VLDL 8 08/09/2016 1016   LDLCALC 92 07/04/2018 1042      Wt Readings from Last 3 Encounters:  11/16/19 300 lb (136.1 kg)  06/09/19 298 lb (135.2 kg)  04/20/19 291 lb (132  kg)      Other studies Reviewed: Additional studies/ records that were reviewed today include: *** Review of the above records demonstrates:  ***   ASSESSMENT AND PLAN:  BILATERAL PE:   ***  The patient is being managed as if she has an unprovoked pulmonary embolism.  She will continue on the meds as listed.  No change in therapy.   I will check a CBC.  SLEEP APNEA:   She is followed by Dr. Radford Pax.  ***  She is going to see her again in August.   MORBID OBESITY:  ***    She needs to lose weight and we have discussed this.   HTN: The blood pressure is ***at target.  She needs weight loss for even better control.     Current medicines are reviewed at length with the patient today.  The patient does not have concerns regarding medicines.  The following changes have been made: *** Labs/ tests ordered today include: ***  No orders of the defined types were placed in this encounter.    Disposition:   FU with ***     Signed, Minus Breeding, MD  07/12/2020 8:32 PM

## 2020-07-13 ENCOUNTER — Ambulatory Visit: Payer: Medicare Other | Admitting: Nurse Practitioner

## 2020-07-13 ENCOUNTER — Ambulatory Visit: Payer: Medicare Other | Admitting: Cardiology

## 2020-07-13 DIAGNOSIS — I2699 Other pulmonary embolism without acute cor pulmonale: Secondary | ICD-10-CM

## 2020-07-13 DIAGNOSIS — I10 Essential (primary) hypertension: Secondary | ICD-10-CM

## 2020-07-13 DIAGNOSIS — G4730 Sleep apnea, unspecified: Secondary | ICD-10-CM

## 2020-07-27 ENCOUNTER — Other Ambulatory Visit: Payer: Self-pay | Admitting: Hematology and Oncology

## 2020-07-27 ENCOUNTER — Other Ambulatory Visit: Payer: Self-pay

## 2020-07-27 ENCOUNTER — Ambulatory Visit
Admission: RE | Admit: 2020-07-27 | Discharge: 2020-07-27 | Disposition: A | Discharge status: Home / Self Care | Payer: Medicare Other | Source: Ambulatory Visit | Attending: Hematology and Oncology | Admitting: Hematology and Oncology

## 2020-07-27 DIAGNOSIS — Z170 Estrogen receptor positive status [ER+]: Secondary | ICD-10-CM

## 2020-07-27 DIAGNOSIS — C50412 Malignant neoplasm of upper-outer quadrant of left female breast: Secondary | ICD-10-CM

## 2020-07-27 DIAGNOSIS — N63 Unspecified lump in unspecified breast: Secondary | ICD-10-CM

## 2020-07-27 IMAGING — MG DIGITAL DIAGNOSTIC BILAT W/ TOMO W/ CAD
8 of 18 series · 8 of 40 positions shown · non-contrast
Comparison: Previous exam(s).

CLINICAL DATA: 69-year-old female presenting for annual exam.
Patient reports a possible new lump in the left breast though has
difficulty locating at today's appointment. Patient has a history of
left breast cancer in [3B] status post lumpectomy.

EXAM:
DIGITAL DIAGNOSTIC BILATERAL MAMMOGRAM WITH CAD AND TOMO
ULTRASOUND LEFT BREAST

[L CC]
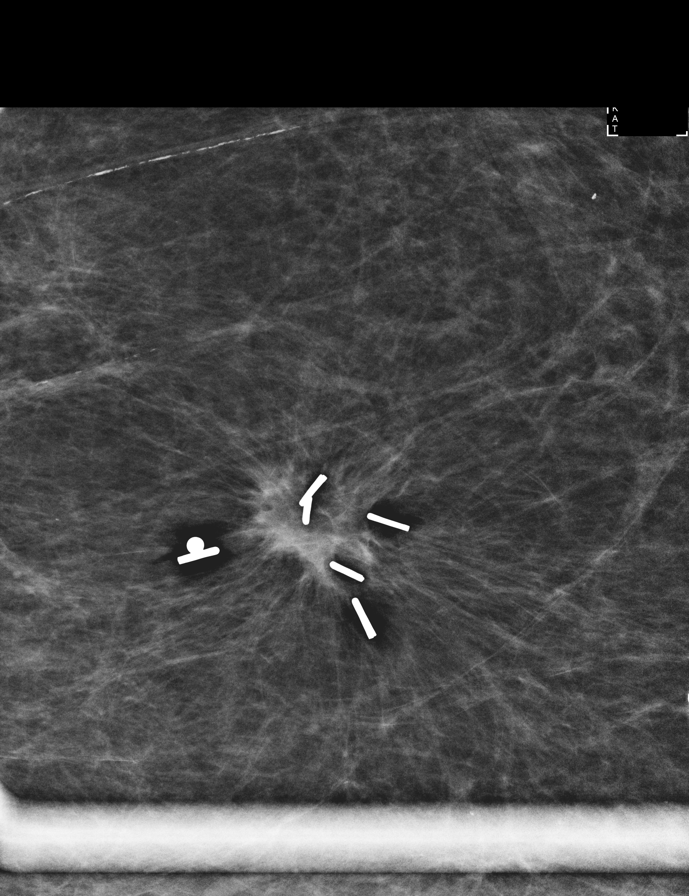

[L CC synth-2D (1 of 3)]
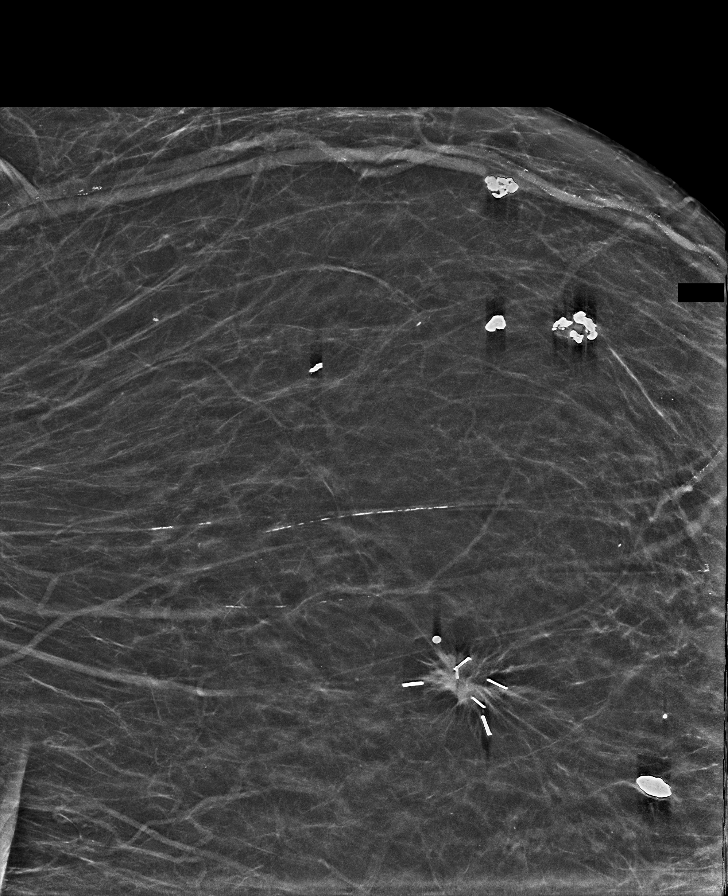

[R CC synth-2D (1 of 2)]
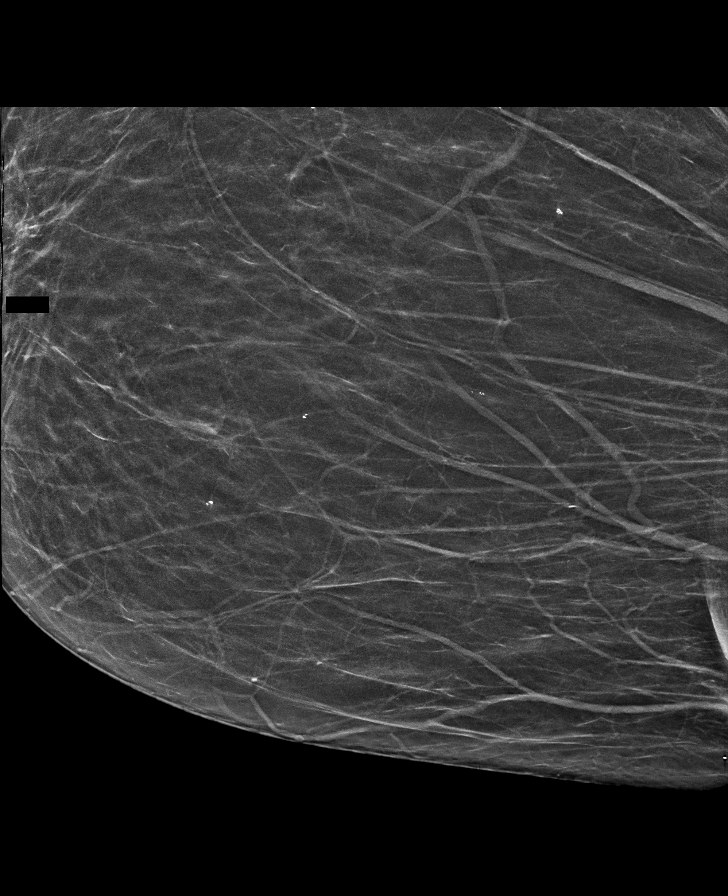

[L CC synth-2D (2 of 3)]
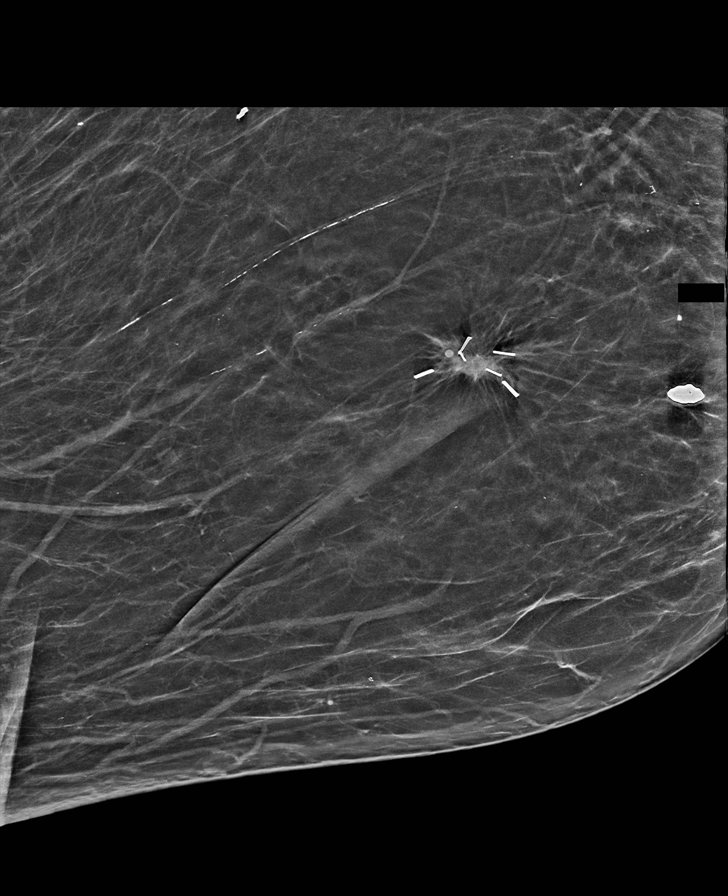

[L CC synth-2D (3 of 3)]
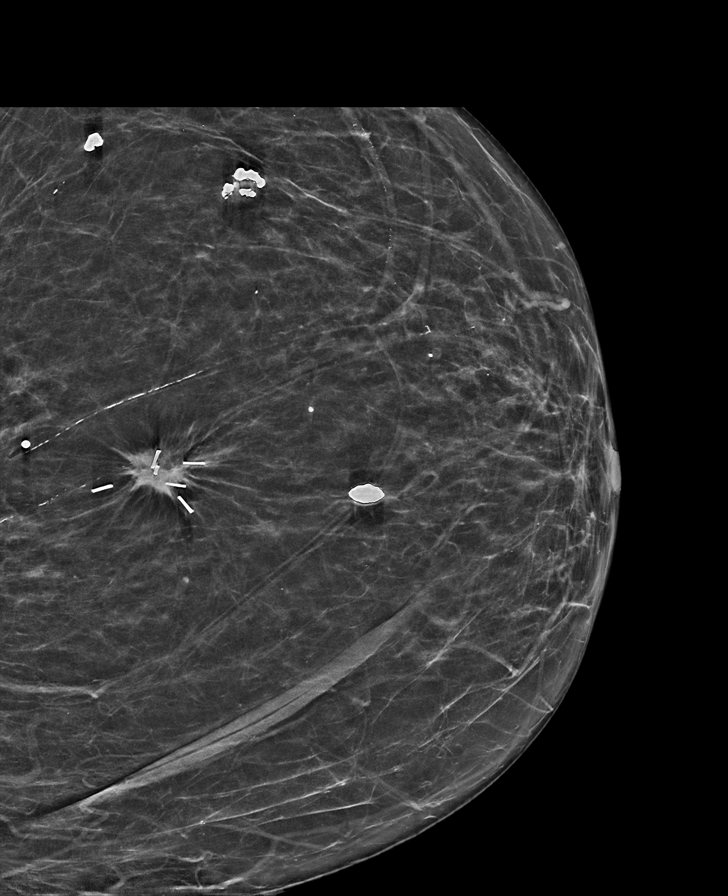

[R CC synth-2D (2 of 2)]
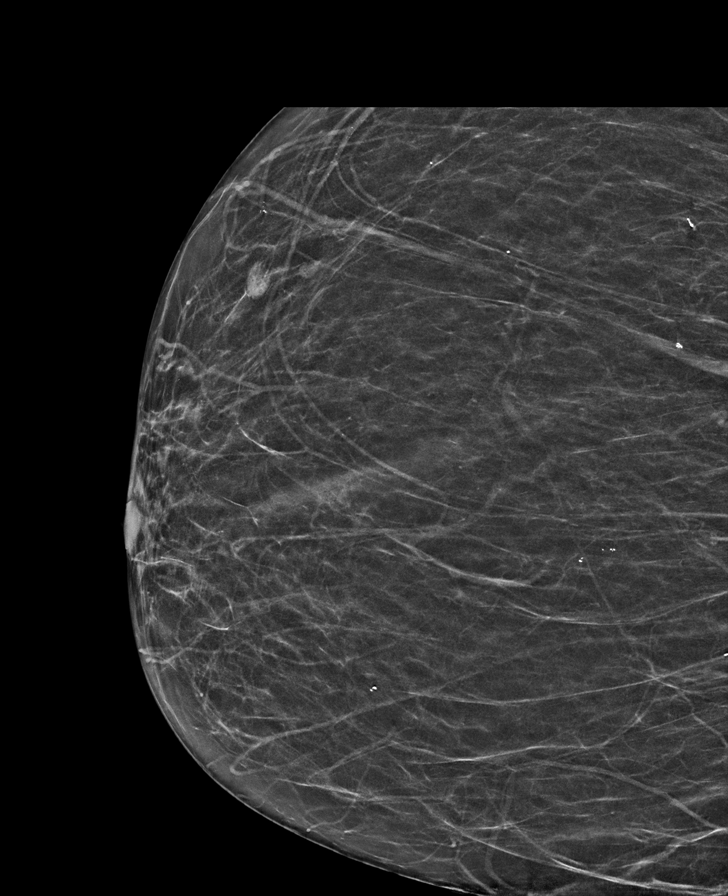

[L MLO synth-2D]
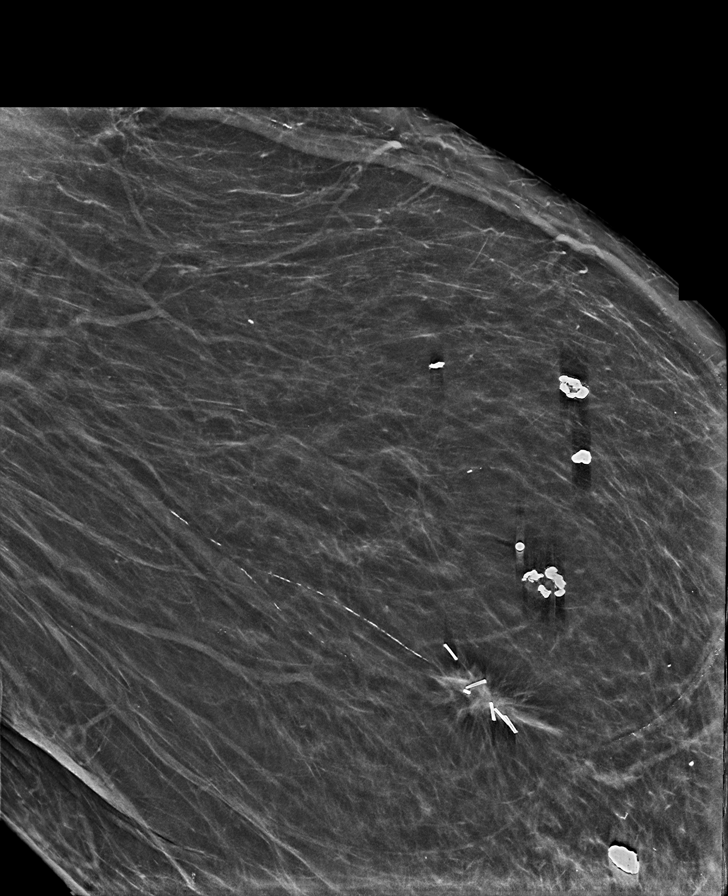

[L CC tomo · tomo slice 35/70.0]
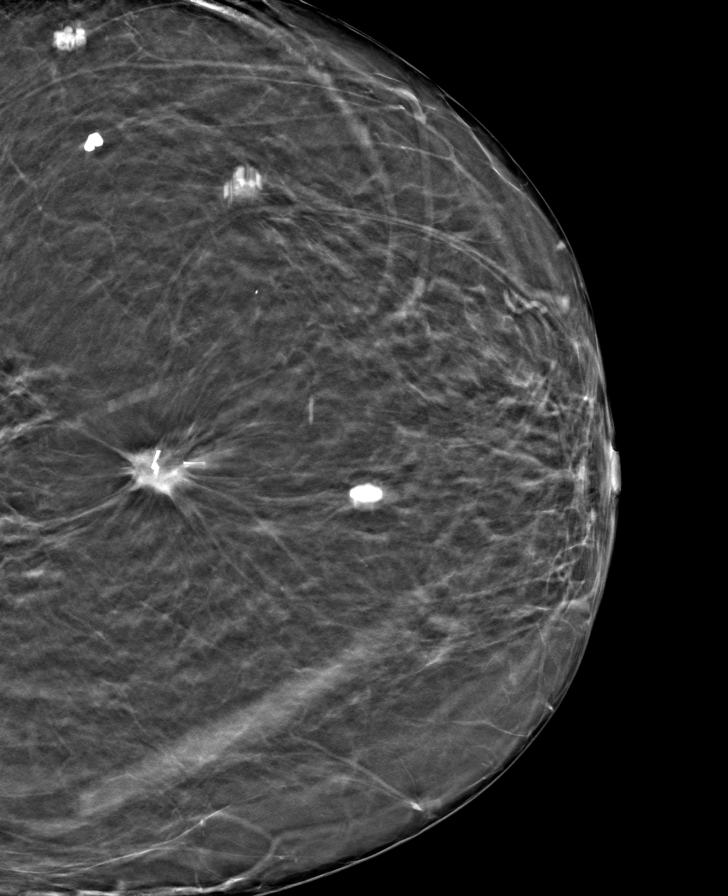

[8 of 40 positions shown; findings below may reference images not displayed]

ACR Breast Density Category b: There are scattered areas of
fibroglandular density.
FINDINGS: Mammogram:

Right breast: No suspicious mass, distortion, or microcalcifications
are identified to suggest presence of malignancy.

Left breast: A spot 2D magnification view of the lumpectomy site
which were performed in addition to standard views. There are stable
postsurgical changes. A skin BB marks the approximate site of
concern reported by the patient in the upper left breast. A
tangential spot compression tomosynthesis view was performed at the
palpable site of concern demonstrating no new abnormality.

Mammographic images were processed with CAD.

Ultrasound:

Targeted ultrasound is performed at the approximate area of
patient's lump at 1 o'clock 14 cm from the nipple demonstrating no
cystic or solid mass.
IMPRESSION: 1.  Stable postsurgical changes in the left breast.

2. No mammographic or sonographic evidence of malignancy at the site
of concern reported by the patient in the upper left breast. Patient
was unable to definitively locate a lump on exam today.

3.  No mammographic evidence of malignancy in the right breast.

RECOMMENDATION:
Diagnostic bilateral mammogram in 1 year.

I have discussed the findings and recommendations with the patient.
If applicable, a reminder letter will be sent to the patient
regarding the next appointment.

BI-RADS CATEGORY  2: Benign.

## 2020-07-27 IMAGING — US US BREAST*L* LIMITED INC AXILLA
1 series · 4 of 4 positions shown · non-contrast
Comparison: Previous exam(s).

CLINICAL DATA: 69-year-old female presenting for annual exam.
Patient reports a possible new lump in the left breast though has
difficulty locating at today's appointment. Patient has a history of
left breast cancer in [3B] status post lumpectomy.

EXAM:
DIGITAL DIAGNOSTIC BILATERAL MAMMOGRAM WITH CAD AND TOMO
ULTRASOUND LEFT BREAST

[Series 1: us breast*left* limited inc axilla · 0.09mm/px · 4 of 4 slices shown]
[im 1/4]
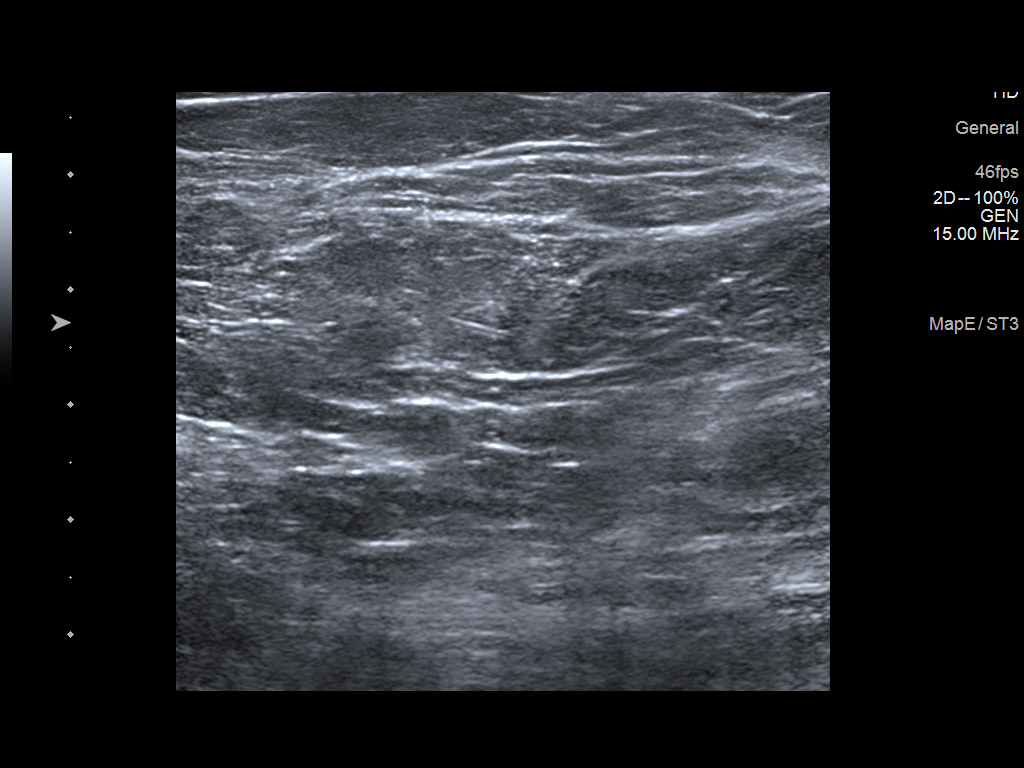
[im 2/4]
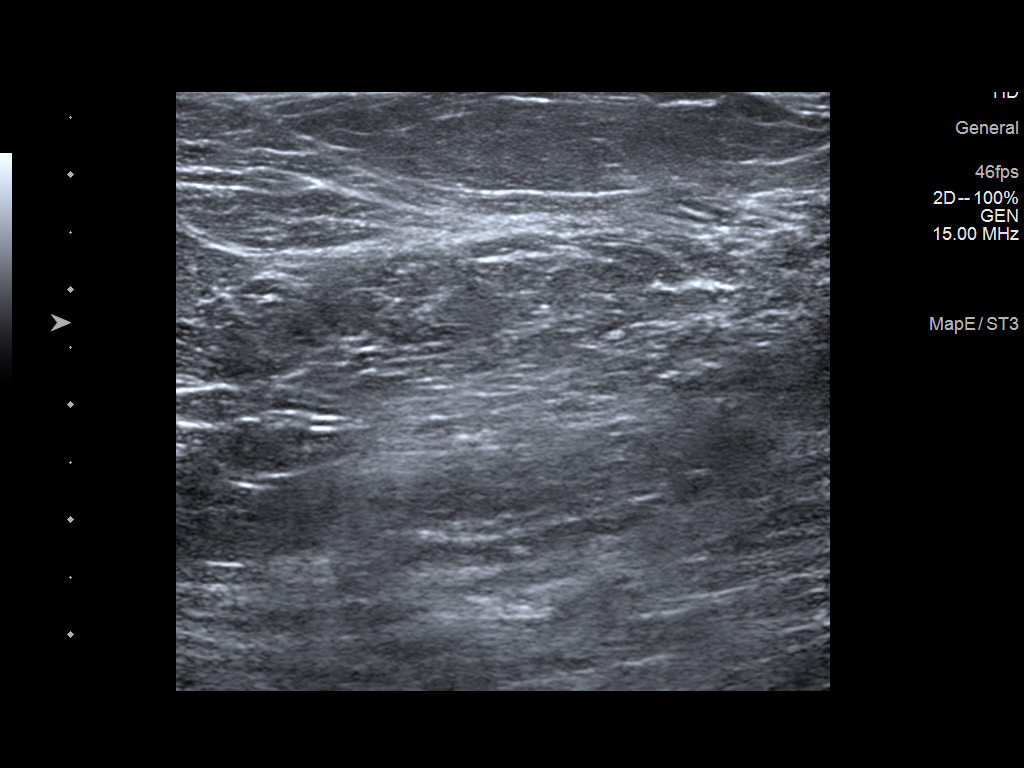
[im 3/4]
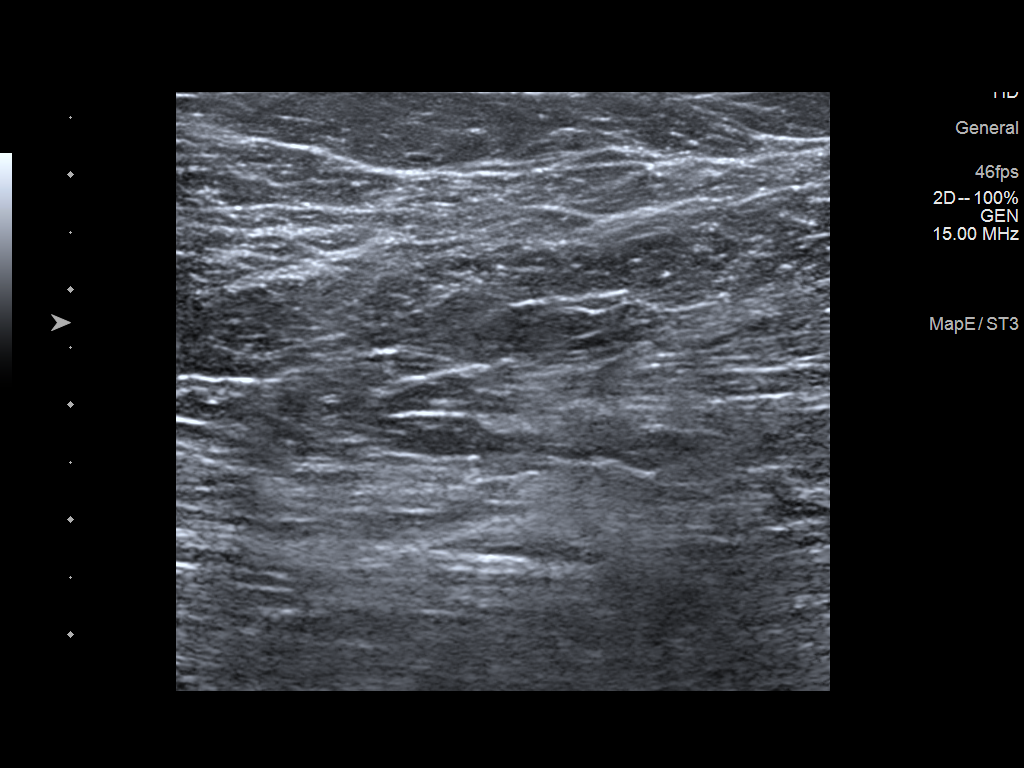
[im 4/4]
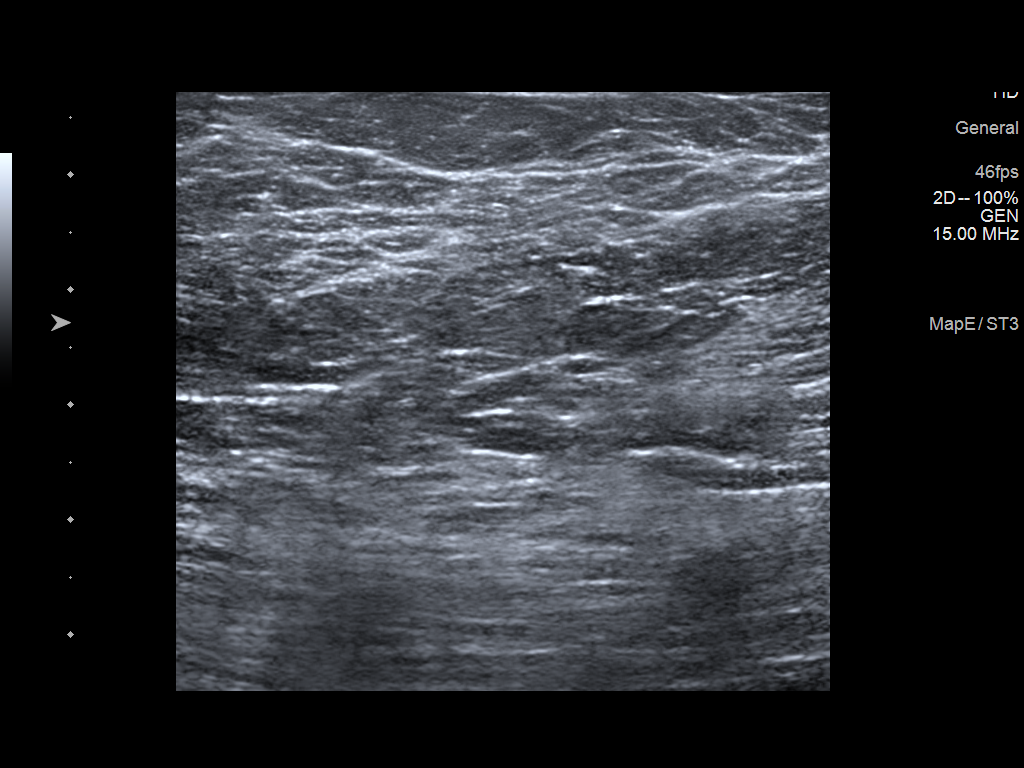

[4 of 4 positions shown; findings below may reference images not displayed]

ACR Breast Density Category b: There are scattered areas of
fibroglandular density.
FINDINGS: Mammogram:

Right breast: No suspicious mass, distortion, or microcalcifications
are identified to suggest presence of malignancy.

Left breast: A spot 2D magnification view of the lumpectomy site
which were performed in addition to standard views. There are stable
postsurgical changes. A skin BB marks the approximate site of
concern reported by the patient in the upper left breast. A
tangential spot compression tomosynthesis view was performed at the
palpable site of concern demonstrating no new abnormality.

Mammographic images were processed with CAD.

Ultrasound:

Targeted ultrasound is performed at the approximate area of
patient's lump at 1 o'clock 14 cm from the nipple demonstrating no
cystic or solid mass.
IMPRESSION: 1.  Stable postsurgical changes in the left breast.

2. No mammographic or sonographic evidence of malignancy at the site
of concern reported by the patient in the upper left breast. Patient
was unable to definitively locate a lump on exam today.

3.  No mammographic evidence of malignancy in the right breast.

RECOMMENDATION:
Diagnostic bilateral mammogram in 1 year.

I have discussed the findings and recommendations with the patient.
If applicable, a reminder letter will be sent to the patient
regarding the next appointment.

BI-RADS CATEGORY  2: Benign.

## 2020-08-11 ENCOUNTER — Other Ambulatory Visit: Payer: Self-pay | Admitting: Student

## 2020-08-11 DIAGNOSIS — Z86718 Personal history of other venous thrombosis and embolism: Secondary | ICD-10-CM

## 2020-08-14 ENCOUNTER — Other Ambulatory Visit: Payer: Self-pay | Admitting: Endocrinology

## 2020-08-17 ENCOUNTER — Other Ambulatory Visit: Payer: Self-pay

## 2020-08-17 ENCOUNTER — Other Ambulatory Visit (INDEPENDENT_AMBULATORY_CARE_PROVIDER_SITE_OTHER): Payer: Medicare Other

## 2020-08-17 ENCOUNTER — Other Ambulatory Visit: Payer: Medicare Other

## 2020-08-17 DIAGNOSIS — R7301 Impaired fasting glucose: Secondary | ICD-10-CM | POA: Diagnosis not present

## 2020-08-17 DIAGNOSIS — E890 Postprocedural hypothyroidism: Secondary | ICD-10-CM

## 2020-08-17 LAB — T4, FREE: Free T4: 1.3 ng/dL (ref 0.60–1.60)

## 2020-08-17 LAB — TSH: TSH: 1.27 u[IU]/mL (ref 0.35–4.50)

## 2020-08-17 LAB — HEMOGLOBIN A1C: Hgb A1c MFr Bld: 5.4 % (ref 4.6–6.5)

## 2020-08-17 LAB — GLUCOSE, RANDOM: Glucose, Bld: 111 mg/dL — ABNORMAL HIGH (ref 70–99)

## 2020-08-17 NOTE — Progress Notes (Unsigned)
Cardiology Office Note   Date:  08/18/2020   ID:  ARDETH REPETTO, DOB 12/21/1950, MRN [id number]  PCP:  Andree Moro, DO  Cardiologist:   Minus Breeding, MD   No chief complaint on file.     History of Present Illness: Sheila Jacobs is a 69 y.o. female who presents for follow up of pulmonary embolism.  She was admitted in April 2019 with this.   She had a follow up Doppler in Jan 2020 and had no evidence of thrombus.     The patient has no new complaints.  She thinks that when she takes her p.m. dose of Eliquis she gets some back pain.  She has lymphedema.  She has some soreness in her legs with this.  She says she does have some follow-up soon apparently to treat this.  She is been staying with her daughter.  However, she lives in the apartments on [address] and wants to go back there.  She does a lot of her shopping up a [employer] across the street.  She eats potato chips, drinks soda and likes "Oodles of Noodles.   Past Medical History:  Diagnosis Date  . Acute saddle pulmonary embolism without acute cor pulmonale ([place]) 12/29/2017  . Anginal pain ([place])   . Arthritis   . Breast cancer of upper-outer quadrant of left female breast ([place]) 01/31/2016  . Colon polyps   . GERD (gastroesophageal reflux disease)   . History of radiation therapy 10/9-11/20/17   Left Breast 45 Gy in 25 fractions, Left Breast Boost 6 Gy in 3 fractions.   . Hyperlipidemia   . Hypertension   . Morbid obesity ([place])   . OSA (obstructive sleep apnea) 01/23/2018  . Palpitations 10/16/2016  . Personal history of noncompliance with medical treatment, presenting hazards to health 08/27/2016  . Personal history of radiation therapy   . Thyroid goiter     Past Surgical History:  Procedure Laterality Date  . BREAST BIOPSY Left 01/2016  . BREAST LUMPECTOMY Left 04/2016  . BREAST LUMPECTOMY WITH RADIOACTIVE SEED AND SENTINEL LYMPH NODE BIOPSY Left 04/11/2016   Procedure: BREAST  LUMPECTOMY WITH RADIOACTIVE SEED AND SENTINEL LYMPH NODE BIOPSY;  Surgeon: Excell Seltzer, MD;  Location: [place] [facility];  Service: General;  Laterality: Left;  . COLONOSCOPY WITH PROPOFOL N/A 10/20/2015   Procedure: COLONOSCOPY WITH PROPOFOL;  Surgeon: Mauri Pole, MD;  Location: [facility];  Service: Endoscopy;  Laterality: N/A;  . ESOPHAGOGASTRODUODENOSCOPY (EGD) WITH PROPOFOL N/A 10/20/2015   Procedure: ESOPHAGOGASTRODUODENOSCOPY (EGD) WITH PROPOFOL;  Surgeon: Mauri Pole, MD;  Location: [facility];  Service: Endoscopy;  Laterality: N/A;  . IR ANGIOGRAM PULMONARY BILATERAL SELECTIVE  12/29/2017  . IR ANGIOGRAM SELECTIVE EACH ADDITIONAL VESSEL  12/29/2017  . IR ANGIOGRAM SELECTIVE EACH ADDITIONAL VESSEL  12/29/2017  . IR INFUSION THROMBOL ARTERIAL INITIAL (MS)  12/29/2017  . IR INFUSION THROMBOL ARTERIAL INITIAL (MS)  12/29/2017  . IR THROMB F/U EVAL ART/VEN FINAL DAY (MS)  12/30/2017  . IR US GUIDE VASC ACCESS RIGHT  12/29/2017  . LEFT HEART CATHETERIZATION WITH CORONARY ANGIOGRAM N/A 10/21/2014   Procedure: LEFT HEART CATHETERIZATION WITH CORONARY ANGIOGRAM;  Surgeon: Burnell Blanks, MD;  [facility];  Service: Cardiovascular;  Laterality: N/A;  . THYROIDECTOMY N/A 02/26/2017   Procedure: TOTAL THYROIDECTOMY;  Surgeon: Excell Seltzer, MD;  Location: [facility];  Service: General;  Laterality: N/A;  . TUBAL LIGATION       Current Outpatient Medications  Medication Sig Dispense Refill  . acetaminophen (TYLENOL) 500 MG tablet Take 500 mg by mouth every 6 (six) hours as needed.    Marland Kitchen anastrozole (ARIMIDEX) 1 MG tablet TAKE 1 TABLET BY MOUTH EVERY DAY 90 tablet 3  . ELIQUIS 5 MG TABS tablet TAKE 1 TABLET BY MOUTH TWICE A DAY 180 tablet 2  . hydrochlorothiazide (HYDRODIURIL) 25 MG tablet Take 1 tablet (25 mg total) by mouth daily. 90 tablet 3  . ketoconazole (NIZORAL) 2 % cream APPLY TOPICALLY EVERY DAY AS NEEDED FOR IRRITATION 30 g 1  . levothyroxine (SYNTHROID) 137 MCG  tablet TAKE 1 TABLET (137 MCG TOTAL) BY MOUTH DAILY BEFORE BREAKFAST. 90 tablet 0  . metoprolol succinate (TOPROL-XL) 50 MG 24 hr tablet Take 50 mg by mouth daily.    Marland Kitchen omeprazole (PRILOSEC) 20 MG capsule Take 20 mg by mouth in the morning and at bedtime.    . potassium chloride (K-DUR) 10 MEQ tablet TAKE 3 TABLETS BY MOUTH THE FIRST 2 DAYS, THEN 1 TABKET ONCE A DAY 35 tablet 0   No current facility-administered medications for this visit.    Allergies:   Cortisone, Effexor [venlafaxine], Lexapro [escitalopram], and Xarelto [rivaroxaban]   ROS:  Please see the history of present illness.   Otherwise, review of systems are positive for none if she here.   All other systems are reviewed and negative.    PHYSICAL EXAM: VS:  BP (!) 148/70   Pulse 85   Ht 5\' 3"  (1.6 m)   Wt (!) 310 lb 3.2 oz (140.7 kg)   BMI 54.95 kg/m  , BMI Body mass index is 54.95 kg/m. GENERAL:  Well appearing NECK:  No jugular venous distention, waveform within normal limits, carotid upstroke brisk and symmetric, no bruits, no thyromegaly LUNGS:  Clear to auscultation bilaterally CHEST:  Unremarkable HEART:  PMI not displaced or sustained,S1 and S2 within normal limits, no S3, no S4, no clicks, no rubs, NO murmurs ABD:  Flat, positive bowel sounds normal in frequency in pitch, no bruits, no rebound, no guarding, no midline pulsatile mass, no hepatomegaly, no splenomegaly EXT:  2 plus pulses   EKG:  EKG is  ordered today. The ekg ordered today demonstrates sinus rhythm, rate 85, axis within normal limits, intervals within normal limits, nonspecific ST-T wave changes.  Final Recent Labs: 08/17/2020: TSH 1.27    Lipid Panel    Component Value Date/Time   CHOL 174 07/04/2018 1042   TRIG 55 07/04/2018 1042   HDL 71 07/04/2018 1042   CHOLHDL 2.5 07/04/2018 1042   CHOLHDL 2.6 08/09/2016 1016   VLDL 8 08/09/2016 1016   LDLCALC 92 07/04/2018 1042      Wt Readings from Last 3 Encounters:  08/18/20 (!) 310 lb  3.2 oz (140.7 kg)  11/16/19 300 lb (136.1 kg)  06/09/19 298 lb (135.2 kg)      Other studies Reviewed: Additional studies/ records that were reviewed today include: None Review of the above records demonstrates: NA   ASSESSMENT AND PLAN:  BILATERAL PE:     The patient should be on chronic anticoagulation given unprovoked pulmonary emboli.  We had a long discussion about this.  I offered to switch to Xarelto since she thinks the Eliquis is causing some back pains occasionally but she does not want to do this.  She is on the appropriate dose  SLEEP APNEA: She wears her CPAP but forgets at night.  She wants to know if it is okay to start  wearing it when she takes naps and I suggested yes.  MORBID OBESITY:   She eats food as above.  She shops at the [employer] across the street.  I am going to have her see our health coach.  She needs a walk-through on her diet.  LYMPHEDEMA: We discussed the fact that weight loss will help this and she needs to not miss her therapy appointments.  HTN: The blood pressure is blood pressure is mildly elevated.  However, I looked back in her previous readings and they are fine.  No change in therapy.   Current medicines are reviewed at length with the patient today.  The patient does not have concerns regarding medicines.  The following changes have been made: None Labs/ tests ordered today include: None  Orders Placed This Encounter  Procedures  . EKG 12-Lead     Disposition:   FU with APP in six months.   Signed, Minus Breeding, MD  08/18/2020 9:32 AM

## 2020-08-18 ENCOUNTER — Encounter: Payer: Self-pay | Admitting: Cardiology

## 2020-08-18 ENCOUNTER — Ambulatory Visit (INDEPENDENT_AMBULATORY_CARE_PROVIDER_SITE_OTHER): Payer: Medicare Other | Admitting: Cardiology

## 2020-08-18 ENCOUNTER — Ambulatory Visit
Admission: RE | Admit: 2020-08-18 | Discharge: 2020-08-18 | Disposition: A | Discharge status: Home / Self Care | Payer: Medicare Other | Source: Ambulatory Visit | Attending: Student | Admitting: Student

## 2020-08-18 VITALS — BP 148/70 | HR 85 | Ht 63.0 in | Wt 310.2 lb

## 2020-08-18 DIAGNOSIS — I10 Essential (primary) hypertension: Secondary | ICD-10-CM

## 2020-08-18 DIAGNOSIS — Z7901 Long term (current) use of anticoagulants: Secondary | ICD-10-CM | POA: Diagnosis not present

## 2020-08-18 DIAGNOSIS — Z86718 Personal history of other venous thrombosis and embolism: Secondary | ICD-10-CM

## 2020-08-18 DIAGNOSIS — G4730 Sleep apnea, unspecified: Secondary | ICD-10-CM | POA: Diagnosis not present

## 2020-08-18 DIAGNOSIS — R0789 Other chest pain: Secondary | ICD-10-CM

## 2020-08-18 IMAGING — US US EXTREM LOW VENOUS*R*
1 series · 13 of 24 positions shown · non-contrast
Comparison: None.

CLINICAL DATA: Calf swelling in the setting of a history of prior
DVT and PE. Evaluate for DVT.

EXAM:
RIGHT LOWER EXTREMITY VENOUS DOPPLER ULTRASOUND
TECHNIQUE: Gray-scale sonography with compression, as well as color and duplex
ultrasound, were performed to evaluate the deep venous system(s)
from the level of the common femoral vein through the popliteal and
proximal calf veins.

[Series 1: us extrem low venous*right* · 0.09mm/px · 13 of 33 slices shown]
[im 1/33]
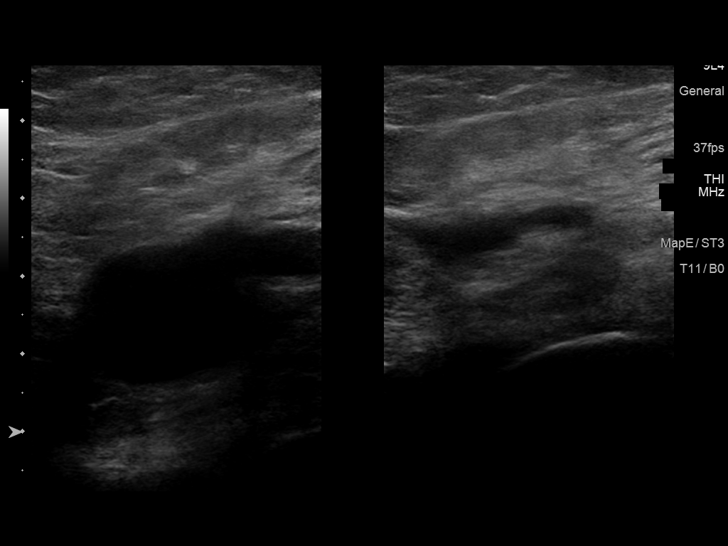
[im 3/33]
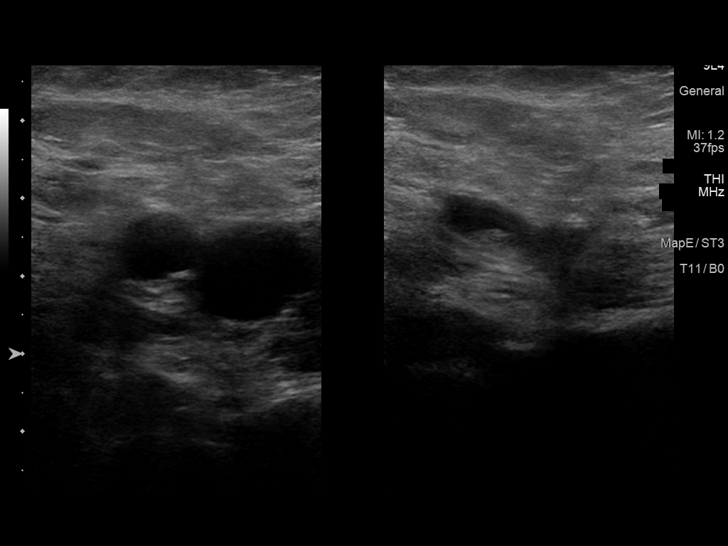
[im 6/33]
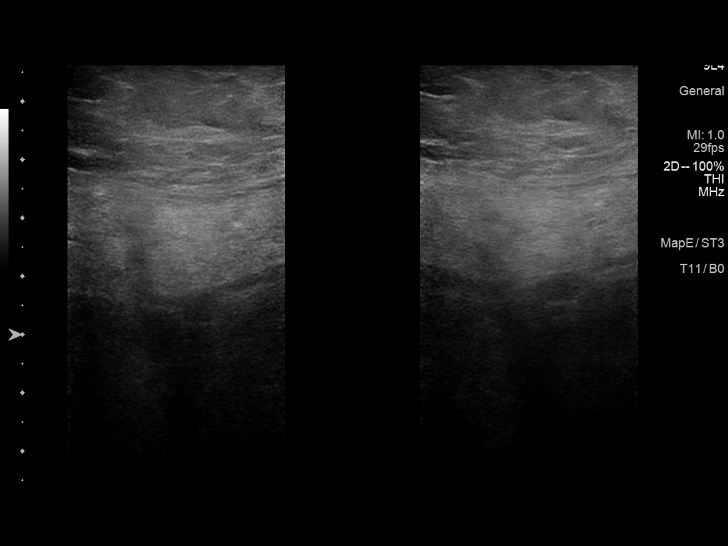
[im 9/33]
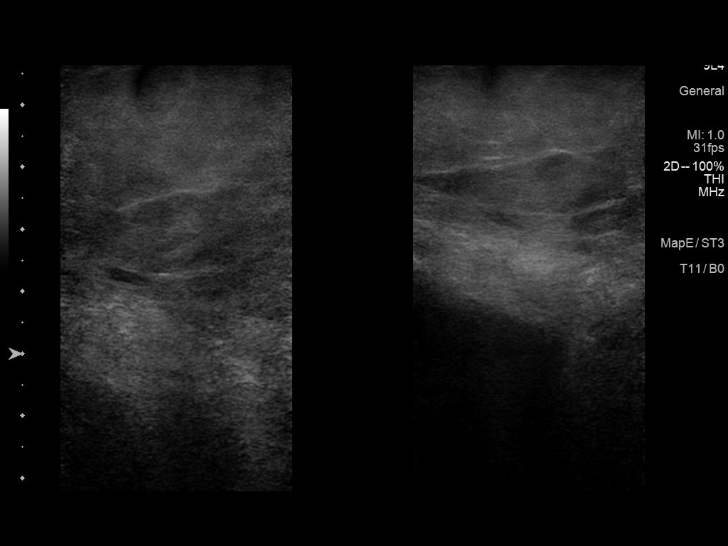
[im 12/33]
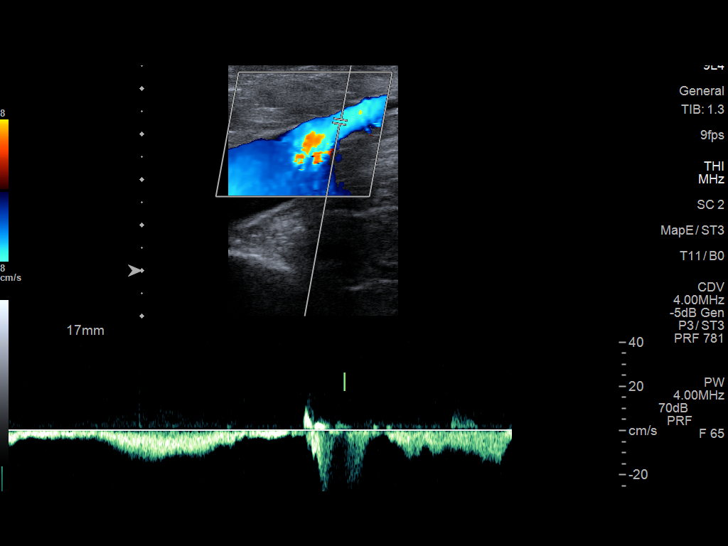
[im 14/33]
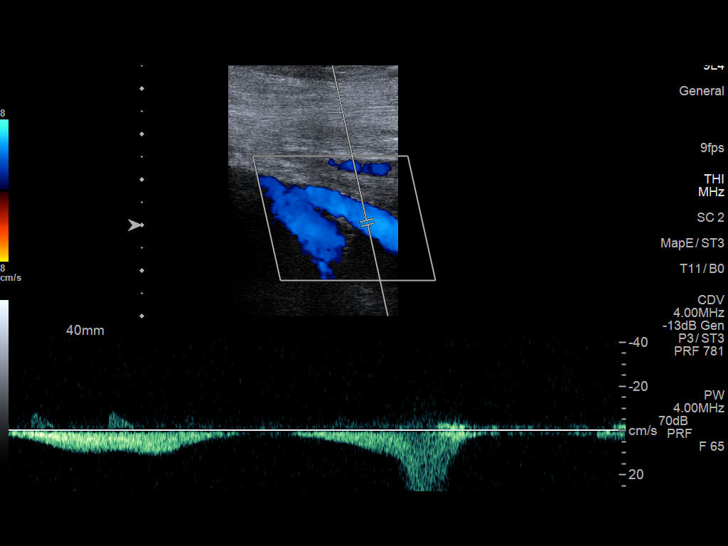
[im 17/33]
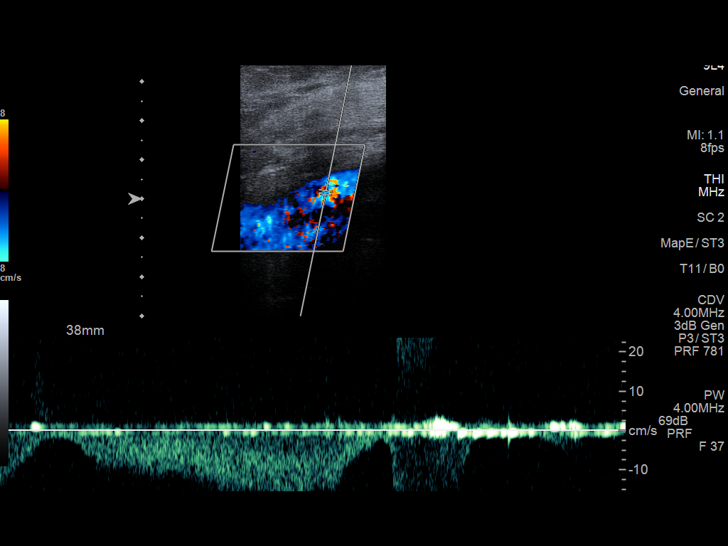
[im 19/33]
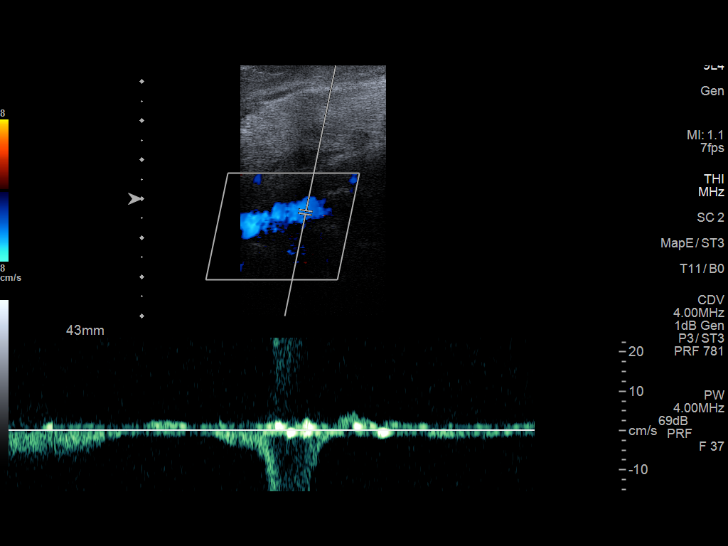
[im 21/33]
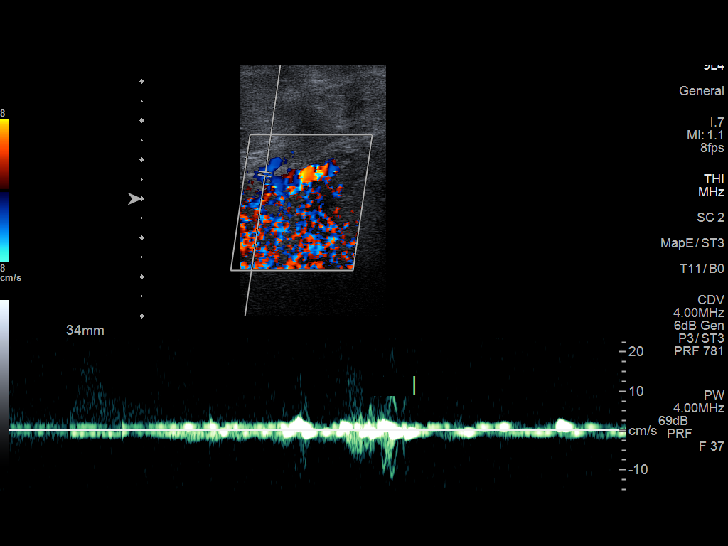
[im 24/33]
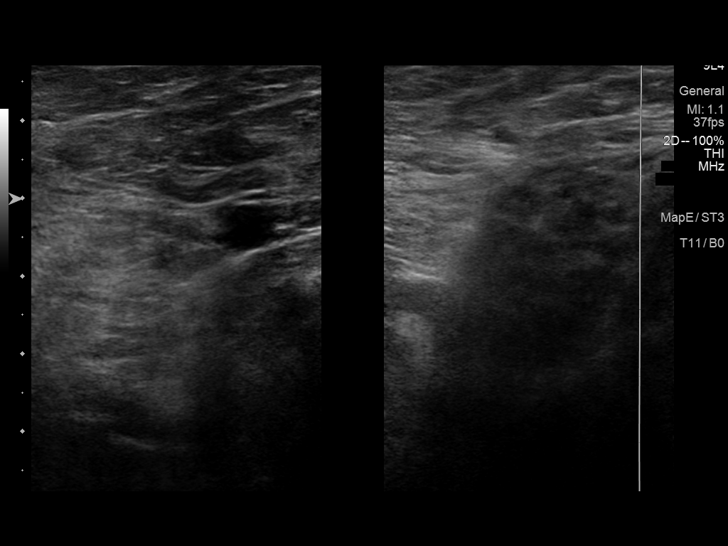
[im 27/33]
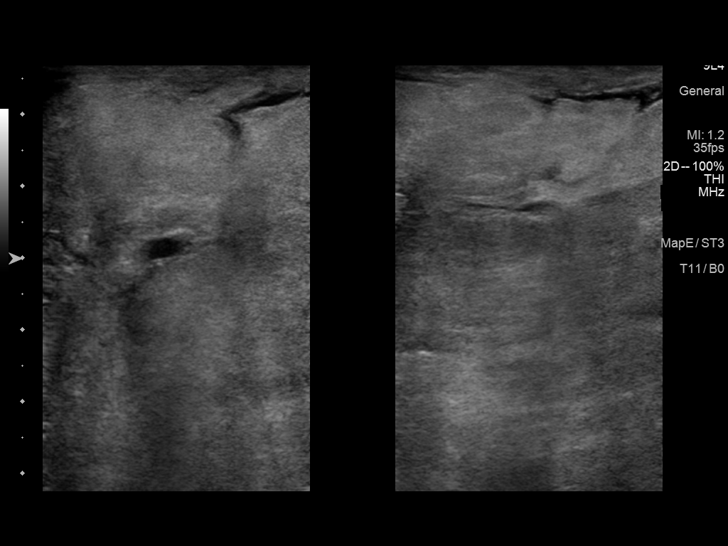
[im 30/33]
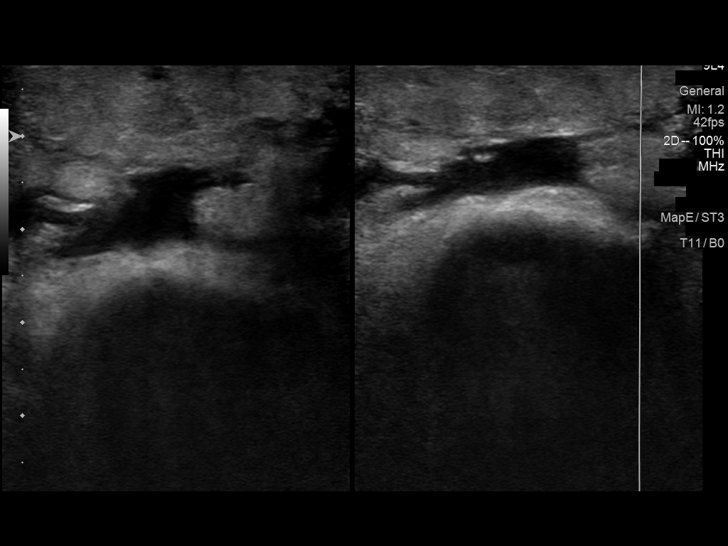
[im 33/33]
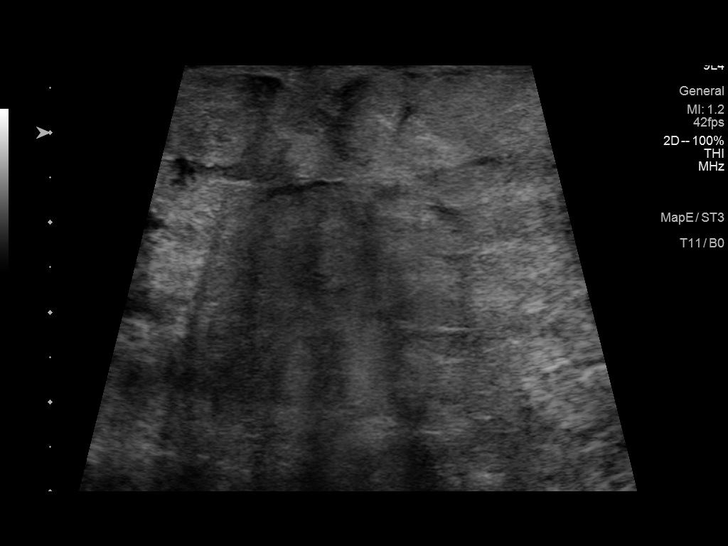

[13 of 24 positions shown; findings below may reference images not displayed]

FINDINGS: VENOUS

Normal compressibility of the common femoral, superficial femoral,
and popliteal veins, as well as the visualized calf veins. Of note,
visualization of the calf veins is quite limited by patient body
habitus and superficial subcutaneous edema. Visualized portions of
profunda femoral vein and great saphenous vein unremarkable. No
filling defects to suggest DVT on grayscale or color Doppler
imaging. Doppler waveforms show normal direction of venous flow,
normal respiratory plasticity and response to augmentation.

Limited views of the contralateral common femoral vein are
unremarkable.

OTHER

None.

Limitations: none
IMPRESSION: 1. No evidence of deep venous thrombosis to the level of the
popliteal vein.
2. Evaluation of the calf veins is somewhat limited by patient body
habitus and superficial subcutaneous edema. No convincing evidence
of calf vein DVT visualized.

## 2020-08-18 NOTE — Patient Instructions (Signed)
Medication Instructions:  No changes *If you need a refill on your cardiac medications before your next appointment, please call your pharmacy*  Lab Work: None ordered this visit  Testing/Procedures: None ordered this visit  Follow-Up: At [facility], you and your health needs are our priority.  As part of our continuing mission to provide you with exceptional heart care, we have created designated Provider Care Teams.  These Care Teams include your primary Cardiologist (physician) and Advanced Practice Providers (APPs -  Physician Assistants and Nurse Practitioners) who all work together to provide you with the care you need, when you need it.   Your next appointment:   6 month(s)  You will receive a reminder letter in the mail two months in advance. If you don't receive a letter, please call our office to schedule the follow-up appointment.  The format for your next appointment:   In Person  Provider:   Kerin Ransom, [place]  Other Instructions Referred to health coach

## 2020-08-19 ENCOUNTER — Ambulatory Visit: Payer: Medicare Other | Admitting: Endocrinology

## 2020-08-19 ENCOUNTER — Telehealth: Payer: Self-pay

## 2020-08-19 DIAGNOSIS — Z0000 Encounter for general adult medical examination without abnormal findings: Secondary | ICD-10-CM

## 2020-08-19 NOTE — Telephone Encounter (Signed)
Called patient per Dr. [name] regarding healthy eating behaviors. Patient is interested in health coaching. Patient has been scheduled for initial health coaching session with Care Guide on 09/05/20 at 11:15am. Patient will be arranging transportation for appointment.

## 2020-08-25 ENCOUNTER — Encounter: Payer: Self-pay | Admitting: Endocrinology

## 2020-08-25 ENCOUNTER — Other Ambulatory Visit: Payer: Self-pay

## 2020-08-25 ENCOUNTER — Ambulatory Visit (INDEPENDENT_AMBULATORY_CARE_PROVIDER_SITE_OTHER): Payer: Medicare Other | Admitting: Endocrinology

## 2020-08-25 VITALS — BP 140/82 | HR 72 | Ht 63.0 in | Wt 307.6 lb

## 2020-08-25 DIAGNOSIS — E890 Postprocedural hypothyroidism: Secondary | ICD-10-CM

## 2020-08-25 NOTE — Progress Notes (Signed)
Patient ID: Sheila Jacobs, female   DOB: 07/21/1951, 69 y.o.   MRN: [id number]           Reason for Appointment: HYPOTHYROIDISM, follow-up      History of Present Illness:   The patient's thyroid enlargement was first discovered in 2009 probably on her routine physical exam  [facility]   She however does not know when her goiter first started    On her initial evaluation she did have needle biopsy of her large right-sided nodule which was benign  Clinically she was felt to have a colloid goiter, mostly right sided  Because of her local pressure symptoms with choking feeling, coughing she was referred for thyroidectomy which was done in June 2018 This had relieved her local pressure symptoms  RECENT history:  She has been previously on 150 mcg of levothyroxine supplementation for her postsurgical hypothyroidism since about 08/2017 Because of her TSH being 0.22 in 12/2018 her dose was reduced to 137 mcg  Since then she has been on the same dose Her only complaint is some discomfort in her lower neck at times at times difficulty swallowing unless she is eating slowly No complaints of unusual fatigue She has gained weight  She has been regular with taking her generic levothyroxine with water in the morning on empty stomach   TSH is quite consistent at 1.3  Wt Readings from Last 3 Encounters:  08/25/20 (!) 307 lb 9.6 oz (139.5 kg)  08/18/20 (!) 310 lb 3.2 oz (140.7 kg)  11/16/19 300 lb (136.1 kg)    Lab Results  Component Value Date   TSH 1.27 08/17/2020   TSH 1.33 02/12/2020   TSH 0.73 08/18/2019   FREET4 1.30 08/17/2020   FREET4 1.23 02/12/2020   FREET4 1.54 08/18/2019        Allergies as of 08/25/2020      Reactions   Cortisone Rash   Effexor [venlafaxine] Palpitations   Lexapro [escitalopram] Palpitations   Heart racing   Xarelto [rivaroxaban] Rash      Medication List       Accurate as of August 25, 2020  4:21 PM. If you have any questions, ask your nurse or doctor.        acetaminophen 500 MG tablet Commonly known as: TYLENOL Take 500 mg by mouth every 6 (six) hours as needed.   anastrozole 1 MG tablet Commonly known as: ARIMIDEX TAKE 1 TABLET BY MOUTH EVERY DAY   cholecalciferol 25 MCG (1000 UNIT) tablet Commonly known as: VITAMIN D3 Take 1,000 Units by mouth daily.   Eliquis 5 MG Tabs tablet Generic drug: apixaban TAKE 1 TABLET BY MOUTH TWICE A DAY   hydrochlorothiazide 25 MG tablet Commonly known as: HYDRODIURIL Take 1 tablet (25 mg total) by mouth daily.   ketoconazole 2 % cream Commonly known as: NIZORAL APPLY TOPICALLY EVERY DAY AS NEEDED FOR IRRITATION   levothyroxine 137 MCG tablet Commonly known as: SYNTHROID TAKE 1 TABLET (137 MCG TOTAL) BY MOUTH DAILY BEFORE BREAKFAST.   metoprolol succinate 50 MG 24 hr tablet Commonly known as: TOPROL-XL Take 50 mg by mouth daily.   omeprazole 20 MG capsule Commonly known as: PRILOSEC Take 20 mg by mouth in the morning and at bedtime.   potassium chloride 10 MEQ tablet Commonly known as: KLOR-CON TAKE 3 TABLETS BY MOUTH THE FIRST 2 DAYS, THEN 1 TABKET ONCE A DAY   vitamin B-12 100 MCG tablet Commonly  known as: CYANOCOBALAMIN Take 100 mcg by mouth daily.       Allergies:  Allergies  Allergen Reactions  . Cortisone Rash  . Effexor [Venlafaxine] Palpitations  . Lexapro [Escitalopram] Palpitations    Heart racing  . Xarelto [Rivaroxaban] Rash    Past Medical History:  Diagnosis Date  . Acute saddle pulmonary embolism without acute cor pulmonale ([place]) 12/29/2017  . Anginal pain ([place])   . Arthritis   . Breast cancer of upper-outer quadrant of left female breast ([place]) 01/31/2016  . Colon polyps   . GERD (gastroesophageal reflux disease)   . History of radiation therapy 10/9-11/20/17   Left Breast 45 Gy in 25 fractions, Left Breast Boost 6 Gy in 3 fractions.   . Hyperlipidemia   . Hypertension   .  Morbid obesity ([place])   . OSA (obstructive sleep apnea) 01/23/2018  . Palpitations 10/16/2016  . Personal history of noncompliance with medical treatment, presenting hazards to health 08/27/2016  . Personal history of radiation therapy   . Thyroid goiter     Past Surgical History:  Procedure Laterality Date  . BREAST BIOPSY Left 01/2016  . BREAST LUMPECTOMY Left 04/2016  . BREAST LUMPECTOMY WITH RADIOACTIVE SEED AND SENTINEL LYMPH NODE BIOPSY Left 04/11/2016   Procedure: BREAST LUMPECTOMY WITH RADIOACTIVE SEED AND SENTINEL LYMPH NODE BIOPSY;  Surgeon: Excell Seltzer, MD;  Location: [place];  Service: General;  Laterality: Left;  . COLONOSCOPY WITH PROPOFOL N/A 10/20/2015   Procedure: COLONOSCOPY WITH PROPOFOL;  Surgeon: Mauri Pole, MD;  [facility];  Service: Endoscopy;  Laterality: N/A;  . ESOPHAGOGASTRODUODENOSCOPY (EGD) WITH PROPOFOL N/A 10/20/2015   Procedure: ESOPHAGOGASTRODUODENOSCOPY (EGD) WITH PROPOFOL;  Surgeon: Mauri Pole, MD;  [facility];  Service: Endoscopy;  Laterality: N/A;  . IR ANGIOGRAM PULMONARY BILATERAL SELECTIVE  12/29/2017  . IR ANGIOGRAM SELECTIVE EACH ADDITIONAL VESSEL  12/29/2017  . IR ANGIOGRAM SELECTIVE EACH ADDITIONAL VESSEL  12/29/2017  . IR INFUSION THROMBOL ARTERIAL INITIAL (MS)  12/29/2017  . IR INFUSION THROMBOL ARTERIAL INITIAL (MS)  12/29/2017  . IR THROMB F/U EVAL ART/VEN FINAL DAY (MS)  12/30/2017  . IR US GUIDE VASC ACCESS RIGHT  12/29/2017  . LEFT HEART CATHETERIZATION WITH CORONARY ANGIOGRAM N/A 10/21/2014   Procedure: LEFT HEART CATHETERIZATION WITH CORONARY ANGIOGRAM;  Surgeon: Burnell Blanks, MD;  Location: [facility] CATH LAB;  Service: Cardiovascular;  Laterality: N/A;  . THYROIDECTOMY N/A 02/26/2017   Procedure: TOTAL THYROIDECTOMY;  Surgeon: Excell Seltzer, MD;  [facility];  Service: General;  Laterality: N/A;  . TUBAL LIGATION      Family History  Problem Relation Age of Onset  . Hypertension Mother   .  Heart disease Mother        Pacemaker  . Kidney disease Mother   . Cataracts Mother   . Hearing loss Mother   . Diabetes Mother   . Liver disease Mother   . Diabetes Sister   . Cataracts Brother   . Hearing loss Brother   . Colon cancer Neg Hx   . Thyroid disease Neg Hx     Social History:  reports that she quit smoking about 6 years ago. Her smoking use included cigarettes. She has a 45.00 pack-year smoking history. She has never used smokeless tobacco. She reports that she does not drink alcohol and does not use drugs.    Review of Systems   Hypertension, treated by PCP with hydrochlorothiazide   BP Readings from Last 3 Encounters:  08/25/20 140/82  08/18/20 (!) 148/70  11/16/19 135/75   Previously fasting glucose was high at 107  Lab Results  Component Value Date   HGBA1C 5.4 08/17/2020   HGBA1C 5.1 12/29/2017   HGBA1C 5.3 04/24/2016   Lab Results  Component Value Date   LDLCALC 92 07/04/2018   CREATININE 0.85 09/09/2018      Examination:   BP 140/82   Pulse 72   Ht 5\' 3"  (1.6 m)   Wt (!) 307 lb 9.6 oz (139.5 kg)   SpO2 99%   BMI 54.49 kg/m         No mass felt in the neck, no lymphadenopathy  Bicep reflexes are normal   Assessment/Plan:  Postsurgical hypothyroidism  Thyroid levels are consistent with taking 137 mcg levothyroxine, last dosage change was in April 2020  Subjectively doing well Again she has some nonspecific neck symptoms, also mild difficulty swallowing is unrelated Her neck exam is normal today  She does have some weight gain  TSH is consistently normal at 1.3  Since her thyroid levels are consistently she can come back annually now  Reassured her about the safety of the Covid vaccines and patient handout given, strongly encouraged her to start taking the course of vaccination  Elayne Snare 08/25/2020

## 2020-09-05 ENCOUNTER — Telehealth: Payer: Self-pay

## 2020-09-05 ENCOUNTER — Ambulatory Visit: Payer: Medicare Other

## 2020-09-05 DIAGNOSIS — Z0000 Encounter for general adult medical examination without abnormal findings: Secondary | ICD-10-CM

## 2020-09-05 NOTE — Telephone Encounter (Signed)
Called patient to discuss rescheduling appointment that she canceled today. Was unable to leave message due to voicemail being full. Care Guide will reach out to patient again by 09/08/20.

## 2020-09-08 ENCOUNTER — Ambulatory Visit (INDEPENDENT_AMBULATORY_CARE_PROVIDER_SITE_OTHER): Payer: Medicare Other | Admitting: Podiatry

## 2020-09-08 ENCOUNTER — Ambulatory Visit (INDEPENDENT_AMBULATORY_CARE_PROVIDER_SITE_OTHER): Payer: Medicare Other

## 2020-09-08 ENCOUNTER — Other Ambulatory Visit: Payer: Self-pay

## 2020-09-08 ENCOUNTER — Encounter: Payer: Self-pay | Admitting: Podiatry

## 2020-09-08 DIAGNOSIS — M79671 Pain in right foot: Secondary | ICD-10-CM | POA: Diagnosis not present

## 2020-09-08 DIAGNOSIS — M722 Plantar fascial fibromatosis: Secondary | ICD-10-CM | POA: Diagnosis not present

## 2020-09-08 MED ORDER — TRIAMCINOLONE ACETONIDE 10 MG/ML IJ SUSP
10.0000 mg | Freq: Once | INTRAMUSCULAR | Status: AC
  Administered 2020-09-08: 10 mg

## 2020-09-08 NOTE — Progress Notes (Signed)
Subjective:   Patient ID: Sheila Jacobs, female   DOB: 69 y.o.   MRN: [id number]   HPI Patient presents stating that she has had a lot of pain in the bottom of her right heel over the last few months and she does have lymphedema with swelling of her legs.  States it is been very tender when she tries to walk and gradually has become more of an issue for her.  Patient is obese which is complicating factor for her condition and it does not smoke and is not active   Review of Systems  All other systems reviewed and are negative.       Objective:  Physical Exam Vitals and nursing note reviewed.  Constitutional:      Appearance: She is well-developed and well-nourished.  Cardiovascular:     Pulses: Intact distal pulses.  Pulmonary:     Effort: Pulmonary effort is normal.  Musculoskeletal:        General: Normal range of motion.  Skin:    General: Skin is warm.  Neurological:     Mental Status: She is alert.     Neurovascular status was found to be intact muscle strength was found to be adequate range of motion diminished subtalar midtarsal joint.  She does have quite a bit of edema she has tingling-like pain which is probably related to her obesity lymphedema and has exquisite discomfort plantar aspect right heel at the insertional point tendon calcaneus with collapse of the medial longitudinal arch secondary to obesity and lymphedema with stress on her foot structure.  Patient has good digital perfusion is well oriented     Assessment:  Acute plantar fasciitis right along with collapse medial longitudinal arch and probable neuropathic symptomatology 8     Plan:  NP reviewed all conditions and x-rays.  Today I did sterile prep injected the plantar fascia 3 mg Dexasone Kenalog 5 mg Xylocaine advised on support and will reappoint as needed I do not see any good treatments for neuropathy and she is attempting to utilize treatments for lymphedema  X-rays indicate quite a bit of  edema collapse medial longitudinal arch large spur formation plantar heel with arthritis of the subtalar joint midtarsal joint

## 2020-11-02 ENCOUNTER — Other Ambulatory Visit: Payer: Self-pay | Admitting: Podiatry

## 2020-11-02 DIAGNOSIS — M722 Plantar fascial fibromatosis: Secondary | ICD-10-CM

## 2020-11-27 ENCOUNTER — Other Ambulatory Visit: Payer: Self-pay | Admitting: Endocrinology

## 2020-11-29 ENCOUNTER — Telehealth: Payer: Self-pay | Admitting: Cardiology

## 2020-11-29 NOTE — Telephone Encounter (Signed)
Agree she should take the Fe if prescribed.

## 2020-11-29 NOTE — Telephone Encounter (Signed)
Spoke to patient she stated her PCP told her she was anemic and was advised to take a iron supplement.She wanted to know if eating iron enriched foods would be enough.Advised she should take a iron supplement as recommended by PCP and also eat iron enriched foods.Advised to make sure she has cbc repeated as recommended by PCP.I will make Dr.Hochrein aware.

## 2020-11-29 NOTE — Telephone Encounter (Signed)
Patient states she recently saw her PCP and was advised that she is anemic.  She states she was told she will need to go on iron pills and/or adjust her diet. She would like to know if Dr. [name] recommends taking iron pills or just adjusting her diet.  Please advise.

## 2020-12-12 ENCOUNTER — Other Ambulatory Visit: Payer: Self-pay | Admitting: Hematology and Oncology

## 2020-12-21 ENCOUNTER — Telehealth: Payer: Self-pay | Admitting: Hematology and Oncology

## 2020-12-21 NOTE — Telephone Encounter (Signed)
Scheduled appt per 4/4 sch msg. Pt aware.  

## 2021-02-10 ENCOUNTER — Other Ambulatory Visit: Payer: Self-pay | Admitting: Cardiology

## 2021-02-13 ENCOUNTER — Ambulatory Visit: Payer: Medicare Other | Admitting: Hematology and Oncology

## 2021-02-14 NOTE — Assessment & Plan Note (Deleted)
Left lumpectomy 04/11/2016: IDC grade 3, 1.2 cm, IG DCIS, LVI present, 0/8 lymph nodes negative, ER 100%, PR 90%, HER-2 negative, Ki-67 10%, T1 cN0 stage I a pathologic stage  Adj XRT 06/18/16 to 07/30/16  Treatment Plan: Adj Anti-estrogen therapy with Letrozole 2.5 mg dailystarted 08/29/2016  Letrozole toxicities:Denies any hot flashes. Occasional lightheadedness Difficulty with ambulation and obesity and arthralgias: These are pre-existing before letrozole therapy. Patient assured me that she will lose a lot of weight when she sees me next year because she is walking and staying active.  Breast cancer surveillance: 1.Breast exam: Benign  2.mammogram 07/27/20: Benign.  Density category A, no evidence of breast cancer  Return to clinic in 1 year for follow-up

## 2021-02-15 ENCOUNTER — Inpatient Hospital Stay: Payer: Medicare Other | Attending: Hematology and Oncology | Admitting: Hematology and Oncology

## 2021-02-15 DIAGNOSIS — C50412 Malignant neoplasm of upper-outer quadrant of left female breast: Secondary | ICD-10-CM

## 2021-03-02 NOTE — Progress Notes (Deleted)
Cardiology Clinic Note   Patient Name: Sheila Jacobs Date of Encounter: 03/02/2021  Primary Care Provider:  Andree Moro, DO Primary Cardiologist:  Minus Breeding, MD  Patient Profile     Past Medical History    Past Medical History:  Diagnosis Date   Acute saddle pulmonary embolism without acute cor pulmonale ([place]) 12/29/2017   Anginal pain ([place])    Arthritis    Breast cancer of upper-outer quadrant of left female breast ([place]) 01/31/2016   Colon polyps    GERD (gastroesophageal reflux disease)    History of radiation therapy 10/9-11/20/17   Left Breast 45 Gy in 25 fractions, Left Breast Boost 6 Gy in 3 fractions.    Hyperlipidemia    Hypertension    Morbid obesity (H[facility])    OSA (obstructive sleep apnea) 01/23/2018   Palpitations 10/16/2016   Personal history of noncompliance with medical treatment, presenting hazards to health 08/27/2016   Personal history of radiation therapy    Thyroid goiter    Past Surgical History:  Procedure Laterality Date   BREAST BIOPSY Left 01/2016   BREAST LUMPECTOMY Left 04/2016   BREAST LUMPECTOMY WITH RADIOACTIVE SEED AND SENTINEL LYMPH NODE BIOPSY Left 04/11/2016   Procedure: BREAST LUMPECTOMY WITH RADIOACTIVE SEED AND SENTINEL LYMPH NODE BIOPSY;  Surgeon: Excell Seltzer, MD;  Location: [place];  Service: General;  Laterality: Left;   COLONOSCOPY WITH PROPOFOL N/A 10/20/2015   Procedure: COLONOSCOPY WITH PROPOFOL;  Surgeon: Mauri Pole, MD;  Location: [facility];  Service: Endoscopy;  Laterality: N/A;   ESOPHAGOGASTRODUODENOSCOPY (EGD) WITH PROPOFOL N/A 10/20/2015   Procedure: ESOPHAGOGASTRODUODENOSCOPY (EGD) WITH PROPOFOL;  Surgeon: Mauri Pole, MD;  Location: [facility];  Service: Endoscopy;  Laterality: N/A;   IR ANGIOGRAM PULMONARY BILATERAL SELECTIVE  12/29/2017   IR ANGIOGRAM SELECTIVE EACH ADDITIONAL VESSEL  12/29/2017   IR ANGIOGRAM SELECTIVE EACH ADDITIONAL VESSEL  12/29/2017   IR INFUSION THROMBOL ARTERIAL INITIAL (MS)   12/29/2017   IR INFUSION THROMBOL ARTERIAL INITIAL (MS)  12/29/2017   IR THROMB F/U EVAL ART/VEN FINAL DAY (MS)  12/30/2017   IR US GUIDE VASC ACCESS RIGHT  12/29/2017   LEFT HEART CATHETERIZATION WITH CORONARY ANGIOGRAM N/A 10/21/2014   Procedure: LEFT HEART CATHETERIZATION WITH CORONARY ANGIOGRAM;  Surgeon: Burnell Blanks, MD;  Location: [facility];  Service: Cardiovascular;  Laterality: N/A;   THYROIDECTOMY N/A 02/26/2017   Procedure: TOTAL THYROIDECTOMY;  Surgeon: Excell Seltzer, MD;  Location: [facility];  Service: General;  Laterality: N/A;   TUBAL LIGATION      Allergies  Allergies  Allergen Reactions   Cortisone Rash   Effexor [Venlafaxine] Palpitations   Lexapro [Escitalopram] Palpitations    Heart racing   Xarelto [Rivaroxaban] Rash    History of Present Illness     Home Medications    Prior to Admission medications   Medication Sig Start Date End Date Taking? Authorizing Provider  acetaminophen (TYLENOL) 500 MG tablet Take 500 mg by mouth every 6 (six) hours as needed.    [provider]  anastrozole (ARIMIDEX) 1 MG tablet TAKE 1 TABLET BY MOUTH EVERY DAY 12/12/20   Nicholas Lose, MD  atorvastatin (LIPITOR) 10 MG tablet Take 10 mg by mouth daily. 08/30/20   [provider]  cholecalciferol (VITAMIN D3) 25 MCG (1000 UNIT) tablet Take 1,000 Units by mouth daily.    [provider]  ELIQUIS 5 MG TABS tablet TAKE 1 TABLET BY MOUTH TWICE A DAY 02/13/21   Minus Breeding, MD  gabapentin (NEURONTIN) 100 MG capsule Take 100 mg by mouth 3 (three) times daily. 08/28/20   [provider]  hydrochlorothiazide (HYDRODIURIL) 25 MG tablet Take 1 tablet (25 mg total) by mouth daily. 07/17/18   Kathrene Alu, MD  ketoconazole (NIZORAL) 2 % cream APPLY TOPICALLY EVERY DAY AS NEEDED FOR IRRITATION 03/02/19   Kathrene Alu, MD  ketoconazole (NIZORAL) 2 % shampoo Apply topically. 08/28/20   [provider]  levothyroxine (SYNTHROID) 137  MCG tablet TAKE 1 TABLET (137 MCG TOTAL) BY MOUTH DAILY BEFORE BREAKFAST. 11/28/20   Elayne Snare, MD  metoprolol succinate (TOPROL-XL) 50 MG 24 hr tablet Take 50 mg by mouth daily. 06/22/20   [provider]  omeprazole (PRILOSEC) 20 MG capsule Take 20 mg by mouth in the morning and at bedtime.    [provider]  potassium chloride (K-DUR) 10 MEQ tablet TAKE 3 TABLETS BY MOUTH THE FIRST 2 DAYS, THEN 1 TABKET ONCE A DAY 02/05/19   Barrett, Evelene Croon, PA-C  vitamin B-12 (CYANOCOBALAMIN) 100 MCG tablet Take 100 mcg by mouth daily.    [provider]    Family History    Family History  Problem Relation Age of Onset   Hypertension Mother    Heart disease Mother        Pacemaker   Kidney disease Mother    Cataracts Mother    Hearing loss Mother    Diabetes Mother    Liver disease Mother    Diabetes Sister    Cataracts Brother    Hearing loss Brother    Colon cancer Neg Hx    Thyroid disease Neg Hx    She indicated that her mother is deceased. She indicated that her father is deceased. She indicated that her sister is deceased. She indicated that her brother is alive. She indicated that her maternal grandmother is deceased. She indicated that her maternal grandfather is deceased. She indicated that her paternal grandmother is deceased. She indicated that her paternal grandfather is deceased. She indicated that the status of her neg hx is unknown.  Social History    Social History   Socioeconomic History   Marital status: Single    Spouse name: Not on file   Number of children: 1   Years of education: 12   Highest education level: Not on file  Occupational History   Occupation: Retired  Tobacco Use   Smoking status: Former    Packs/day: 1.00    Years: 45.00    Pack years: 45.00    Types: Cigarettes    Quit date: 07/11/2014    Years since quitting: 6.6   Smokeless tobacco: Never  Vaping Use   Vaping Use: Never used  Substance and Sexual Activity    Alcohol use: No    Alcohol/week: 0.0 standard drinks   Drug use: No   Sexual activity: Not on file  Other Topics Concern   Not on file  Social History Narrative   Lives with daughter.    Right-hand.   Occasional use of caffeine.    Social Determinants of Health   Financial Resource Strain: Not on file  Food Insecurity: Not on file  Transportation Needs: Not on file  Physical Activity: Not on file  Stress: Not on file  Social Connections: Not on file  Intimate Partner Violence: Not on file     Review of Systems    General:  No chills, fever, night sweats or weight changes.  Cardiovascular:  No chest  pain, dyspnea on exertion, edema, orthopnea, palpitations, paroxysmal nocturnal dyspnea. Dermatological: No rash, lesions/masses Respiratory: No cough, dyspnea Urologic: No hematuria, dysuria Abdominal:   No nausea, vomiting, diarrhea, bright red blood per rectum, melena, or hematemesis Neurologic:  No visual changes, wkns, changes in mental status. All other systems reviewed and are otherwise negative except as noted above.  Physical Exam    VS:  There were no vitals taken for this visit. , BMI There is no height or weight on file to calculate BMI. GEN: Well nourished, well developed, in no acute distress. HEENT: normal. Neck: Supple, no JVD, carotid bruits, or masses. Cardiac: RRR, no murmurs, rubs, or gallops. No clubbing, cyanosis, edema.  Radials/DP/PT 2+ and equal bilaterally.  Respiratory:  Respirations regular and unlabored, clear to auscultation bilaterally. GI: Soft, nontender, nondistended, BS + x 4. MS: no deformity or atrophy. Skin: warm and dry, no rash. Neuro:  Strength and sensation are intact. Psych: Normal affect.  Accessory Clinical Findings    Recent Labs: 08/17/2020: TSH 1.27   Recent Lipid Panel    Component Value Date/Time   CHOL 174 07/04/2018 1042   TRIG 55 07/04/2018 1042   HDL 71 07/04/2018 1042   CHOLHDL 2.5 07/04/2018 1042   CHOLHDL 2.6  08/09/2016 1016   VLDL 8 08/09/2016 1016   LDLCALC 92 07/04/2018 1042    ECG personally reviewed by me today- *** - No acute changes  Assessment & Plan   1.  ***   Jossie Ng. Sherena Machorro NP-C    03/02/2021, 7:05 AM [place] [address] Office [phone] Fax [phone]  Notice: This dictation was prepared with Dragon dictation along with smaller phrase technology. Any transcriptional errors that result from this process are unintentional and may not be corrected upon review.  I spent***minutes examining this patient, reviewing medications, and using patient centered shared decision making involving her cardiac care.  Prior to her visit I spent greater than 20 minutes reviewing her past medical history,  medications, and prior cardiac tests.

## 2021-03-06 ENCOUNTER — Ambulatory Visit: Payer: Medicare Other | Admitting: General Practice

## 2021-03-06 NOTE — Progress Notes (Deleted)
Cardiology Office Note   Date:  03/06/2021   ID:  Sheila Jacobs, DOB 06/23/1951, MRN [id number]  PCP:  Andree Moro, DO  Cardiologist:   Minus Breeding, MD   No chief complaint on file.     History of Present Illness: Sheila Jacobs is a 70 y.o. female who presents for follow up of pulmonary embolism.  She was admitted in April 2019 with this.   She had a follow up Doppler in Jan 2020 and had no evidence of thrombus.     Since I last saw her ***  *** The patient has no new complaints.  She thinks that when she takes her p.m. dose of Eliquis she gets some back pain.  She has lymphedema.  She has some soreness in her legs with this.  She says she does have some follow-up soon apparently to treat this.  She is been staying with her daughter.  However, she lives in the apartments on [address] and wants to go back there.  She does a lot of her shopping up a [employer] across the street.  She eats potato chips, drinks soda and likes "[employer]odles of Noodles.   Past Medical History:  Diagnosis Date   Acute saddle pulmonary embolism without acute cor pulmonale ([facility]) 12/29/2017   Anginal pain ([facility])    Arthritis    Breast cancer of upper-outer quadrant of left female breast ([place]) 01/31/2016   Colon polyps    GERD (gastroesophageal reflux disease)    History of radiation therapy 10/9-11/20/17   Left Breast 45 Gy in 25 fractions, Left Breast Boost 6 Gy in 3 fractions.    Hyperlipidemia    Hypertension    Morbid obesity (HCC)    OSA (obstructive sleep apnea) 01/23/2018   Palpitations 10/16/2016   Personal history of noncompliance with medical treatment, presenting hazards to health 08/27/2016   Personal history of radiation therapy    Thyroid goiter     Past Surgical History:  Procedure Laterality Date   BREAST BIOPSY Left 01/2016   BREAST LUMPECTOMY Left 04/2016   BREAST LUMPECTOMY WITH RADIOACTIVE SEED AND SENTINEL LYMPH NODE BIOPSY Left 04/11/2016   Procedure:  BREAST LUMPECTOMY WITH RADIOACTIVE SEED AND SENTINEL LYMPH NODE BIOPSY;  Surgeon: Excell Seltzer, MD;  Location: [place];  Service: General;  Laterality: Left;   COLONOSCOPY WITH PROPOFOL N/A 10/20/2015   Procedure: COLONOSCOPY WITH PROPOFOL;  Surgeon: Mauri Pole, MD;  [facility];  Service: Endoscopy;  Laterality: N/A;   ESOPHAGOGASTRODUODENOSCOPY (EGD) WITH PROPOFOL N/A 10/20/2015   Procedure: ESOPHAGOGASTRODUODENOSCOPY (EGD) WITH PROPOFOL;  Surgeon: Mauri Pole, MD;  [facility];  Service: Endoscopy;  Laterality: N/A;   IR ANGIOGRAM PULMONARY BILATERAL SELECTIVE  12/29/2017   IR ANGIOGRAM SELECTIVE EACH ADDITIONAL VESSEL  12/29/2017   IR ANGIOGRAM SELECTIVE EACH ADDITIONAL VESSEL  12/29/2017   IR INFUSION THROMBOL ARTERIAL INITIAL (MS)  12/29/2017   IR INFUSION THROMBOL ARTERIAL INITIAL (MS)  12/29/2017   IR THROMB F/U EVAL ART/VEN FINAL DAY (MS)  12/30/2017   IR US GUIDE VASC ACCESS RIGHT  12/29/2017   LEFT HEART CATHETERIZATION WITH CORONARY ANGIOGRAM N/A 10/21/2014   Procedure: LEFT HEART CATHETERIZATION WITH CORONARY ANGIOGRAM;  Surgeon: Burnell Blanks, MD;  Location: [facility];  Service: Cardiovascular;  Laterality: N/A;   THYROIDECTOMY N/A 02/26/2017   Procedure: TOTAL THYROIDECTOMY;  Surgeon: Excell Seltzer, MD;  Location: [facility];  Service: General;  Laterality: N/A;   TUBAL LIGATION  Current Outpatient Medications  Medication Sig Dispense Refill   acetaminophen (TYLENOL) 500 MG tablet Take 500 mg by mouth every 6 (six) hours as needed.     anastrozole (ARIMIDEX) 1 MG tablet TAKE 1 TABLET BY MOUTH EVERY DAY 90 tablet 0   atorvastatin (LIPITOR) 10 MG tablet Take 10 mg by mouth daily.     cholecalciferol (VITAMIN D3) 25 MCG (1000 UNIT) tablet Take 1,000 Units by mouth daily.     ELIQUIS 5 MG TABS tablet TAKE 1 TABLET BY MOUTH TWICE A DAY 180 tablet 1   gabapentin (NEURONTIN) 100 MG capsule Take 100 mg by mouth 3 (three) times daily.      hydrochlorothiazide (HYDRODIURIL) 25 MG tablet Take 1 tablet (25 mg total) by mouth daily. 90 tablet 3   ketoconazole (NIZORAL) 2 % cream APPLY TOPICALLY EVERY DAY AS NEEDED FOR IRRITATION 30 g 1   ketoconazole (NIZORAL) 2 % shampoo Apply topically.     levothyroxine (SYNTHROID) 137 MCG tablet TAKE 1 TABLET (137 MCG TOTAL) BY MOUTH DAILY BEFORE BREAKFAST. 90 tablet 2   metoprolol succinate (TOPROL-XL) 50 MG 24 hr tablet Take 50 mg by mouth daily.     omeprazole (PRILOSEC) 20 MG capsule Take 20 mg by mouth in the morning and at bedtime.     potassium chloride (K-DUR) 10 MEQ tablet TAKE 3 TABLETS BY MOUTH THE FIRST 2 DAYS, THEN 1 TABKET ONCE A DAY 35 tablet 0   vitamin B-12 (CYANOCOBALAMIN) 100 MCG tablet Take 100 mcg by mouth daily.     No current facility-administered medications for this visit.    Allergies:   Cortisone, Effexor [venlafaxine], Lexapro [escitalopram], and Xarelto [rivaroxaban]   ROS:  Please see the history of present illness.   Otherwise, review of systems are positive for *** .   All other systems are reviewed and negative.    PHYSICAL EXAM: VS:  There were no vitals taken for this visit. , BMI There is no height or weight on file to calculate BMI. GENERAL:  Well appearing NECK:  No jugular venous distention, waveform within normal limits, carotid upstroke brisk and symmetric, no bruits, no thyromegaly LUNGS:  Clear to auscultation bilaterally CHEST:  Unremarkable HEART:  PMI not displaced or sustained,S1 and S2 within normal limits, no S3, no S4, no clicks, no rubs, *** murmurs ABD:  Flat, positive bowel sounds normal in frequency in pitch, no bruits, no rebound, no guarding, no midline pulsatile mass, no hepatomegaly, no splenomegaly EXT:  2 plus pulses throughout, no edema, no cyanosis no clubbing    *** GENERAL:  Well appearing NECK:  No jugular venous distention, waveform within normal limits, carotid upstroke brisk and symmetric, no bruits, no  thyromegaly LUNGS:  Clear to auscultation bilaterally CHEST:  Unremarkable HEART:  PMI not displaced or sustained,S1 and S2 within normal limits, no S3, no S4, no clicks, no rubs, NO murmurs ABD:  Flat, positive bowel sounds normal in frequency in pitch, no bruits, no rebound, no guarding, no midline pulsatile mass, no hepatomegaly, no splenomegaly EXT:  2 plus pulses   EKG:  EKG is *** ordered today. The ekg ordered today demonstrates sinus rhythm, rate ***, axis within normal limits, intervals within normal limits, nonspecific ST-T wave changes.  Final Recent Labs: 08/17/2020: TSH 1.27    Lipid Panel    Component Value Date/Time   CHOL 174 07/04/2018 1042   TRIG 55 07/04/2018 1042   HDL 71 07/04/2018 1042   CHOLHDL 2.5 07/04/2018 1042  CHOLHDL 2.6 08/09/2016 1016   VLDL 8 08/09/2016 1016   LDLCALC 92 07/04/2018 1042      Wt Readings from Last 3 Encounters:  08/25/20 (!) 307 lb 9.6 oz (139.5 kg)  08/18/20 (!) 310 lb 3.2 oz (140.7 kg)  11/16/19 300 lb (136.1 kg)      Other studies Reviewed: Additional studies/ records that were reviewed today include: *** Review of the above records demonstrates:  ***   ASSESSMENT AND PLAN:  BILATERAL PE:     *** The patient should be on chronic anticoagulation given unprovoked pulmonary emboli.  We had a long discussion about this.  I offered to switch to Xarelto since she thinks the Eliquis is causing some back pains occasionally but she does not want to do this.  She is on the appropriate dose  SLEEP APNEA: She wears her CPAP.  ***  but forgets at night.  She wants to know if it is okay to start wearing it when she takes naps and I suggested yes.  MORBID OBESITY:   *** She eats food as above.  She shops at the [employer] across the street.  I am going to have her see our health coach.  She needs a walk-through on her diet.  LYMPHEDEMA:   ***  We discussed the fact that weight loss will help this and she needs to not miss her  therapy appointments.  HTN: The blood pressure is *** blood pressure is mildly elevated.  However, I looked back in her previous readings and they are fine.  No change in therapy.   Current medicines are reviewed at length with the patient today.  The patient does not have concerns regarding medicines.  The following changes have been made: *** Labs/ tests ordered today include:  ***  No orders of the defined types were placed in this encounter.    Disposition:   FU in *** months.   Signed, Minus Breeding, MD  03/06/2021 8:30 AM

## 2021-03-07 DIAGNOSIS — I890 Lymphedema, not elsewhere classified: Secondary | ICD-10-CM | POA: Insufficient documentation

## 2021-03-09 ENCOUNTER — Ambulatory Visit: Payer: Medicare Other | Admitting: Cardiology

## 2021-03-15 ENCOUNTER — Other Ambulatory Visit: Payer: Self-pay | Admitting: Hematology and Oncology

## 2021-04-17 ENCOUNTER — Ambulatory Visit: Payer: Medicare Other | Admitting: Podiatry

## 2021-04-28 ENCOUNTER — Ambulatory Visit: Payer: Medicare Other | Admitting: Podiatry

## 2021-05-09 NOTE — Progress Notes (Deleted)
Cardiology Office Note   Date:  05/09/2021   ID:  Kema, Reding 1951/05/22, MRN [id number]  PCP:  Arthur Holms, NP  Cardiologist:   Minus Breeding, MD   No chief complaint on file.     History of Present Illness: Sheila Jacobs is a 70 y.o. female who presents for follow up of pulmonary embolism.  She was admitted in April 2019 with this.   She had a follow up Doppler in Jan 2020 and had no evidence of thrombus.     Since I last saw her ***  ***The patient has no new complaints.  She thinks that when she takes her p.m. dose of Eliquis she gets some back pain.  She has lymphedema.  She has some soreness in her legs with this.  She says she does have some follow-up soon apparently to treat this.  She is been staying with her daughter.  However, she lives in the apartments on [address] and wants to go back there.  She does a lot of her shopping up a [employer] across the street.  She eats potato chips, drinks soda and likes "[employer]od[employer] of Noodles.   Past Medical History:  Diagnosis Date   Acute saddle pulmonary embolism without acute cor pulmonale (HCC) 12/29/2017   Anginal pain (HCC)    Arthritis    Breast cancer of upper-outer quadrant of left female breast ([place]) 01/31/2016   Colon polyps    GERD (gastroesophageal reflux disease)    History of radiation therapy 10/9-11/20/17   Left Breast 45 Gy in 25 fractions, Left Breast Boost 6 Gy in 3 fractions.    Hyperlipidemia    Hypertension    Morbid obesity (HCC)    OSA (obstructive sleep apnea) 01/23/2018   Palpitations 10/16/2016   Personal history of noncompliance with medical treatment, presenting hazards to health 08/27/2016   Personal history of radiation therapy    Thyroid goiter     Past Surgical History:  Procedure Laterality Date   BREAST BIOPSY Left 01/2016   BREAST LUMPECTOMY Left 04/2016   BREAST LUMPECTOMY WITH RADIOACTIVE SEED AND SENTINEL LYMPH NODE BIOPSY Left 04/11/2016   Procedure:  BREAST LUMPECTOMY WITH RADIOACTIVE SEED AND SENTINEL LYMPH NODE BIOPSY;  Surgeon: Excell Seltzer, MD;  Location: [facility];  Service: General;  Laterality: Left;   COLONOSCOPY WITH PROPOFOL N/A 10/20/2015   Procedure: COLONOSCOPY WITH PROPOFOL;  Surgeon: Mauri Pole, MD;  [facility];  Service: Endoscopy;  Laterality: N/A;   ESOPHAGOGASTRODUODENOSCOPY (EGD) WITH PROPOFOL N/A 10/20/2015   Procedure: ESOPHAGOGASTRODUODENOSCOPY (EGD) WITH PROPOFOL;  Surgeon: Mauri Pole, MD;  [facility];  Service: Endoscopy;  Laterality: N/A;   IR ANGIOGRAM PULMONARY BILATERAL SELECTIVE  12/29/2017   IR ANGIOGRAM SELECTIVE EACH ADDITIONAL VESSEL  12/29/2017   IR ANGIOGRAM SELECTIVE EACH ADDITIONAL VESSEL  12/29/2017   IR INFUSION THROMBOL ARTERIAL INITIAL (MS)  12/29/2017   IR INFUSION THROMBOL ARTERIAL INITIAL (MS)  12/29/2017   IR THROMB F/U EVAL ART/VEN FINAL DAY (MS)  12/30/2017   IR US GUIDE VASC ACCESS RIGHT  12/29/2017   LEFT HEART CATHETERIZATION WITH CORONARY ANGIOGRAM N/A 10/21/2014   Procedure: LEFT HEART CATHETERIZATION WITH CORONARY ANGIOGRAM;  Surgeon: Burnell Blanks, MD;  Location[facility] C[facility];  Service: Cardiovascular;  Laterality: N/A;   THYROIDECTOMY N/A 02/26/2017   Procedure: TOTAL THYROIDECTOMY;  Surgeon: Excell Seltzer, MD;  Location: [facility];  Service: General;  Laterality: N/A;   TUBAL LIGATION  Current Outpatient Medications  Medication Sig Dispense Refill   acetaminophen (TYLENOL) 500 MG tablet Take 500 mg by mouth every 6 (six) hours as needed.     anastrozole (ARIMIDEX) 1 MG tablet TAKE 1 TABLET BY MOUTH EVERY DAY 90 tablet 0   atorvastatin (LIPITOR) 10 MG tablet Take 10 mg by mouth daily.     cholecalciferol (VITAMIN D3) 25 MCG (1000 UNIT) tablet Take 1,000 Units by mouth daily.     ELIQUIS 5 MG TABS tablet TAKE 1 TABLET BY MOUTH TWICE A DAY 180 tablet 1   gabapentin (NEURONTIN) 100 MG capsule Take 100 mg by mouth 3 (three) times daily.      hydrochlorothiazide (HYDRODIURIL) 25 MG tablet Take 1 tablet (25 mg total) by mouth daily. 90 tablet 3   ketoconazole (NIZORAL) 2 % cream APPLY TOPICALLY EVERY DAY AS NEEDED FOR IRRITATION 30 g 1   ketoconazole (NIZORAL) 2 % shampoo Apply topically.     levothyroxine (SYNTHROID) 137 MCG tablet TAKE 1 TABLET (137 MCG TOTAL) BY MOUTH DAILY BEFORE BREAKFAST. 90 tablet 2   metoprolol succinate (TOPROL-XL) 50 MG 24 hr tablet Take 50 mg by mouth daily.     omeprazole (PRILOSEC) 20 MG capsule Take 20 mg by mouth in the morning and at bedtime.     potassium chloride (K-DUR) 10 MEQ tablet TAKE 3 TABLETS BY MOUTH THE FIRST 2 DAYS, THEN 1 TABKET ONCE A DAY 35 tablet 0   vitamin B-12 (CYANOCOBALAMIN) 100 MCG tablet Take 100 mcg by mouth daily.     No current facility-administered medications for this visit.    Allergies:   Cortisone, Effexor [venlafaxine], Lexapro [escitalopram], and Xarelto [rivaroxaban]   ROS:  Please see the history of present illness.   Otherwise, review of systems are positive for ***.   All other systems are reviewed and negative.    PHYSICAL EXAM: VS:  There were no vitals taken for this visit. , BMI There is no height or weight on file to calculate BMI. GENERAL:  Well appearing NECK:  No jugular venous distention, waveform within normal limits, carotid upstroke brisk and symmetric, no bruits, no thyromegaly LUNGS:  Clear to auscultation bilaterally CHEST:  Unremarkable HEART:  PMI not displaced or sustained,S1 and S2 within normal limits, no S3, no S4, no clicks, no rubs, *** murmurs ABD:  Flat, positive bowel sounds normal in frequency in pitch, no bruits, no rebound, no guarding, no midline pulsatile mass, no hepatomegaly, no splenomegaly EXT:  2 plus pulses throughout, no edema, no cyanosis no clubbing    ***GENERAL:  Well appearing NECK:  No jugular venous distention, waveform within normal limits, carotid upstroke brisk and symmetric, no bruits, no thyromegaly LUNGS:   Clear to auscultation bilaterally CHEST:  Unremarkable HEART:  PMI not displaced or sustained,S1 and S2 within normal limits, no S3, no S4, no clicks, no rubs, NO murmurs ABD:  Flat, positive bowel sounds normal in frequency in pitch, no bruits, no rebound, no guarding, no midline pulsatile mass, no hepatomegaly, no splenomegaly EXT:  2 plus pulses   EKG:  EKG is *** ordered today. The ekg ordered today demonstrates sinus rhythm, rate ***, axis within normal limits, intervals within normal limits, nonspecific ST-T wave changes.  Final Recent Labs: 08/17/2020: TSH 1.27    Lipid Panel    Component Value Date/Time   CHOL 174 07/04/2018 1042   TRIG 55 07/04/2018 1042   HDL 71 07/04/2018 1042   CHOLHDL 2.5 07/04/2018 1042   CHOLHDL 2.6  08/09/2016 1016   VLDL 8 08/09/2016 1016   LDLCALC 92 07/04/2018 1042      Wt Readings from Last 3 Encounters:  08/25/20 (!) 307 lb 9.6 oz (139.5 kg)  08/18/20 (!) 310 lb 3.2 oz (140.7 kg)  11/16/19 300 lb (136.1 kg)      Other studies Reviewed: Additional studies/ records that were reviewed today include: *** Review of the above records demonstrates: ***   ASSESSMENT AND PLAN:  BILATERAL PE:    ***  The patient should be on chronic anticoagulation given unprovoked pulmonary emboli.  We had a long discussion about this.  I offered to switch to Xarelto since she thinks the Eliquis is causing some back pains occasionally but she does not want to do this.  She is on the appropriate dose  SLEEP APNEA: ***  She wears her CPAP but forgets at night.  She wants to know if it is okay to start wearing it when she takes naps and I suggested yes.  MORBID OBESITY:   ***  She eats food as above.  She shops at the [employer] across the street.  I am going to have her see our health coach.  She needs a walk-through on her diet.  LYMPHEDEMA:  *** We discussed the fact that weight loss will help this and she needs to not miss her therapy  appointments.  HTN: The blood pressure is *** blood pressure is mildly elevated.  However, I looked back in her previous readings and they are fine.  No change in therapy.   Current medicines are reviewed at length with the patient today.  The patient does not have concerns regarding medicines.  The following changes have been made: ***  Labs/ tests ordered today include: ***  No orders of the defined types were placed in this encounter.    Disposition:   FU with ***  in *** months.   Signed, Minus Breeding, MD  05/09/2021 9:19 PM

## 2021-05-10 ENCOUNTER — Ambulatory Visit: Payer: Medicare Other | Admitting: Cardiology

## 2021-05-10 DIAGNOSIS — I890 Lymphedema, not elsewhere classified: Secondary | ICD-10-CM

## 2021-05-10 DIAGNOSIS — I10 Essential (primary) hypertension: Secondary | ICD-10-CM

## 2021-05-10 DIAGNOSIS — G4730 Sleep apnea, unspecified: Secondary | ICD-10-CM

## 2021-05-10 NOTE — Progress Notes (Deleted)
Cardiology Office Note:    Date:  05/10/2021   ID:  Sheila Jacobs, DOB 1951-07-16, MRN [id number]  PCP:  Sheila Holms, NP Referring MD: Sheila Jacobs, [facility]  Cardiologist:  Minus Breeding, MD   Reason for visit: overdue 6 month follow-up  History of Present Illness:    Sheila Jacobs is a 70 y.o. female with a hx of pulmonary embolism in April 2019.  Doppler in January 2020 showed no evidence of thrombus.  Patient last saw Dr. Percival Jacobs in December 2021 and complained of leg soreness, lymphedema and back pain patient associated with her evening dose of Eliquis.  Dr. Percival Jacobs offered to switch her to Xarelto since patient thought she was having side effects of Eliquis with patient denied changing medications.  He referred her to our health coach for morbid obesity.  History of bilateral PE - Continue anticoagulation ***  ? Referral to Avelino Leeds, care guide for health goals, tobacco cessation, nutrition, stress management and sleep hygiene. ? Referral to P.R.E.P. Deloria Lair, the [employer] and will begin in [place] in October. The PREP program is a 12 week exercise and lifestyle management program led by an RN who works out the [employer].  This RN is employed by [facility]- however ALL patients are welcome in this program.  [facility] will cover the $100 cost of the program for [facility] patients (we will also find money for non-[facility] patients when necessary).  If you have a patient that needs exercise, nutrition, lifestyle change or just socialization- PLEASE REFER!  Easiest way to refer is to email  [email or web address] or call any of the numbers on the flyer  Hypertension - *** - Goal BP is <130/80.  Recommend DASH diet (high in vegetables, fruits, low-fat dairy products, whole grains, poultry, fish, and nuts and low in sweets, sugar-sweetened beverages, and red meats), salt restriction and increase physical activity.  Obesity - Discussed how even a 5-10% weight loss  can have cardiovascular benefits.   - Recommend moderate intensity activity for 30 minutes 5 days/week and the DASH diet.  Disposition - Follow-up in ***    Past Medical History:  Diagnosis Date   Acute saddle pulmonary embolism without acute cor pulmonale (HCC) 12/29/2017   Anginal pain (HCC)    Arthritis    Breast cancer of upper-outer quadrant of left female breast ([place]) 01/31/2016   Colon polyps    GERD (gastroesophageal reflux disease)    History of radiation therapy 10/9-11/20/17   Left Breast 45 Gy in 25 fractions, Left Breast Boost 6 Gy in 3 fractions.    Hyperlipidemia    Hypertension    Morbid obesity (HCC)    OSA (obstructive sleep apnea) 01/23/2018   Palpitations 10/16/2016   Personal history of noncompliance with medical treatment, presenting hazards to health 08/27/2016   Personal history of radiation therapy    Thyroid goiter     Past Surgical History:  Procedure Laterality Date   BREAST BIOPSY Left 01/2016   BREAST LUMPECTOMY Left 04/2016   BREAST LUMPECTOMY WITH RADIOACTIVE SEED AND SENTINEL LYMPH NODE BIOPSY Left 04/11/2016   Procedure: BREAST LUMPECTOMY WITH RADIOACTIVE SEED AND SENTINEL LYMPH NODE BIOPSY;  Surgeon: Excell Seltzer, MD;  Location: [facility];  Service: General;  Laterality: Left;   COLONOSCOPY WITH PROPOFOL N/A 10/20/2015   Procedure: COLONOSCOPY WITH PROPOFOL;  Surgeon: Mauri Pole, MD;  [facility];  Service: Endoscopy;  Laterality: N/A;   ESOPHAGOGASTRODUODENOSCOPY (EGD) WITH PROPOFOL N/A  10/20/2015   Procedure: ESOPHAGOGASTRODUODENOSCOPY (EGD) WITH PROPOFOL;  Surgeon: Mauri Pole, MD;  [facility];  Service: Endoscopy;  Laterality: N/A;   IR ANGIOGRAM PULMONARY BILATERAL SELECTIVE  12/29/2017   IR ANGIOGRAM SELECTIVE EACH ADDITIONAL VESSEL  12/29/2017   IR ANGIOGRAM SELECTIVE EACH ADDITIONAL VESSEL  12/29/2017   IR INFUSION THROMBOL ARTERIAL INITIAL (MS)  12/29/2017   IR INFUSION THROMBOL ARTERIAL INITIAL (MS)   12/29/2017   IR THROMB F/U EVAL ART/VEN FINAL DAY (MS)  12/30/2017   IR US GUIDE VASC ACCESS RIGHT  12/29/2017   LEFT HEART CATHETERIZATION WITH CORONARY ANGIOGRAM N/A 10/21/2014   Procedure: LEFT HEART CATHETERIZATION WITH CORONARY ANGIOGRAM;  Surgeon: Burnell Blanks, MD;  Location[facility] C[facility] LAB;  Service: Cardiovascular;  Laterality: N/A;   THYROIDECTOMY N/A 02/26/2017   Procedure: TOTAL THYROIDECTOMY;  Surgeon: Excell Seltzer, MD;  [facility];  Service: General;  Laterality: N/A;   TUBAL LIGATION      Current Medications: No outpatient medications have been marked as taking for the 05/11/21 encounter (Appointment) with Warren Lacy, PA-C.     Allergies:   Cortisone, Effexor [venlafaxine], Lexapro [escitalopram], and Xarelto [rivaroxaban]   Social History   Socioeconomic History   Marital status: Single    Spouse name: Not on file   Number of children: 1   Years of education: 12   Highest education level: Not on file  Occupational History   Occupation: Retired  Tobacco Use   Smoking status: Former    Packs/day: 1.00    Years: 45.00    Pack years: 45.00    Types: Cigarettes    Quit date: 07/11/2014    Years since quitting: 6.8   Smokeless tobacco: Never  Vaping Use   Vaping Use: Never used  Substance and Sexual Activity   Alcohol use: No    Alcohol/week: 0.0 standard drinks   Drug use: No   Sexual activity: Not on file  Other Topics Concern   Not on file  Social History Narrative   Lives with daughter.    Right-hand.   Occasional use of caffeine.    Social Determinants of Health   Financial Resource Strain: Not on file  Food Insecurity: Not on file  Transportation Needs: Not on file  Physical Activity: Not on file  Stress: Not on file  Social Connections: Not on file     Family History: The patient's family history includes Cataracts in her brother and mother; Diabetes in her mother and sister; Hearing loss in her brother and mother; Heart  disease in her mother; Hypertension in her mother; Kidney disease in her mother; Liver disease in her mother. There is no history of Colon cancer or Thyroid disease.  ROS:   Please see the history of present illness.     EKGs/Labs/Other Studies Reviewed:    EKG:  The ekg ordered today demonstrates ***  Recent Labs: 08/17/2020: TSH 1.27  Recent Lipid Panel    Component Value Date/Time   CHOL 174 07/04/2018 1042   TRIG 55 07/04/2018 1042   HDL 71 07/04/2018 1042   CHOLHDL 2.5 07/04/2018 1042   CHOLHDL 2.6 08/09/2016 1016   VLDL 8 08/09/2016 1016   LDLCALC 92 07/04/2018 1042    Physical Exam:    VS:  There were no vitals taken for this visit.    Wt Readings from Last 3 Encounters:  08/25/20 (!) 307 lb 9.6 oz (139.5 kg)  08/18/20 (!) 310 lb 3.2 oz (140.7 kg)  11/16/19  300 lb (136.1 kg)     GEN: *** Well nourished, well developed in no acute distress HEENT: Normal NECK: No JVD; No carotid bruits CARDIAC: ***RRR, no murmurs, rubs, gallops RESPIRATORY:  Clear to auscultation without rales, wheezing or rhonchi  ABDOMEN: Soft, non-tender, non-distended MUSCULOSKELETAL: No edema; No deformity  SKIN: Warm and dry NEUROLOGIC:  Alert and oriented PSYCHIATRIC:  Normal affect   ASSESSMENT AND PLAN   ***   {Are you ordering a CV Procedure (e.g. stress test, cath, DCCV, TEE, etc)?   Press F2        :[id number]    Medication Adjustments/Labs and Tests Ordered: Current medicines are reviewed at length with the patient today.  Concerns regarding medicines are outlined above.  No orders of the defined types were placed in this encounter.  No orders of the defined types were placed in this encounter.   There are no Patient Instructions on file for this visit.   Signed, Warren Lacy, PA-C  05/10/2021 10:05 PM    [facility]

## 2021-05-11 ENCOUNTER — Telehealth: Payer: Self-pay

## 2021-05-11 ENCOUNTER — Ambulatory Visit: Payer: Medicare Other | Admitting: Physician Assistant

## 2021-05-11 NOTE — Telephone Encounter (Signed)
Called and left a detailed message for Sheila Jacobs stating that her appointment scheduled for today 05/11/21 at 11:15 AM is being cancelled and that myself or someone from our office will give her a call back later today to reschedule for tomorrow Friday 05/12/21 possibly for the same time. I asked if she has any questions to please give our office a call.

## 2021-05-11 NOTE — Telephone Encounter (Signed)
Left patient another detailed message that her 11:15a appointment today has been canceled and we will work to get her rescheduled. She is welcome to call our office and reschedule herself if she would like.

## 2021-05-11 NOTE — Progress Notes (Deleted)
Cardiology Office Note:    Date:  05/11/2021   ID:  Sheila Jacobs, DOB [birth year], MRN [id number]  PCP:  Arthur Holms, NP Referring MD: Andree Moro, [facility]  Cardiologist:  Minus Breeding, MD   Reason for visit: 6 month follow-up  History of Present Illness:     Sheila Jacobs is a 70 y.o. female with a hx of pulmonary embolism in April 2019.  Doppler in January 2020 showed no evidence of thrombus.  Patient last saw Dr. [name] in December 2021 and complained of leg soreness, lymphedema and back pain patient associated with her evening dose of Eliquis.  Dr. [name] offered to switch her to Xarelto since patient thought she was having side effects of Eliquis with patient denied changing medications.  He referred her to our health coach for morbid obesity.  History of bilateral PE - Continue anticoagulation ***  ? Referral to Avelino Leeds, care guide for health goals, tobacco cessation, nutrition, stress management and sleep hygiene. ? Referral to P.R.E.P. Deloria Lair, the [employer] and will begin in [place] in October. The PREP program is a 12 week exercise and lifestyle management program led by an RN who works out the [employer].  This RN is employed by [facility]- however ALL patients are welcome in this program.  [facility] will cover the $100 cost of the program for [facility] patients (we will also find money for non-[facility] patients when necessary).  If you have a patient that needs exercise, nutrition, lifestyle change or just socialization- PLEASE REFER!  Easiest way to refer is to email  [email or web address] or call any of the numbers on the flyer  Hypertension - *** - Goal BP is <130/80.  Recommend DASH diet (high in vegetables, fruits, low-fat dairy products, whole grains, poultry, fish, and nuts and low in sweets, sugar-sweetened beverages, and red meats), salt restriction and increase physical activity.  Obesity - Discussed how even a 5-10% weight loss can  have cardiovascular benefits.   - Recommend moderate intensity activity for 30 minutes 5 days/week and the DASH diet.  Disposition - Follow-up in ***  Sheila Jacobs is a 70 y.o. female with a hx of pulmonary embolism in April 2019.  Doppler in January 2020 showed no evidence of thrombus.  Patient last saw Dr. [name] in December 2021 and complained of leg soreness, lymphedema and back pain patient associated with her evening dose of Eliquis.  Dr. [name] offered to switch her to Xarelto since patient thought she was having side effects of Eliquis with patient denied changing medications.  He referred her to our health coach for morbid obesity.  History of bilateral PE - Continue anticoagulation ***  ? Referral to Avelino Leeds, care guide for health goals, tobacco cessation, nutrition, stress management and sleep hygiene. ? Referral to P.R.E.P. Deloria Lair, the [employer] and will begin in [place] in October. The PREP program is a 12 week exercise and lifestyle management program led by an RN who works out the [employer].  This RN is employed by [facility]- however ALL patients are welcome in this program.  [facility] will cover the $100 cost of the program for [facility] patients (we will also find money for non-[facility] patients when necessary).  If you have a patient that needs exercise, nutrition, lifestyle change or just socialization- PLEASE REFER!  Easiest way to refer is to email  [email or web address] or call any of the numbers on the flyer  Hypertension - *** -  Goal BP is <130/80.  Recommend DASH diet (high in vegetables, fruits, low-fat dairy products, whole grains, poultry, fish, and nuts and low in sweets, sugar-sweetened beverages, and red meats), salt restriction and increase physical activity.  Obesity - Discussed how even a 5-10% weight loss can have cardiovascular benefits.   - Recommend moderate intensity activity for 30 minutes 5 days/week and the DASH diet.  Disposition - Follow-up in  ***   Past Medical History:  Diagnosis Date   Acute saddle pulmonary embolism without acute cor pulmonale (HCC) 12/29/2017   Anginal pain (HCC)    Arthritis    Breast cancer of upper-outer quadrant of left female breast ([place]) 01/31/2016   Colon polyps    GERD (gastroesophageal reflux disease)    History of radiation therapy 10/9-11/20/17   Left Breast 45 Gy in 25 fractions, Left Breast Boost 6 Gy in 3 fractions.    Hyperlipidemia    Hypertension    Morbid obesity (HCC)    OSA (obstructive sleep apnea) 01/23/2018   Palpitations 10/16/2016   Personal history of noncompliance with medical treatment, presenting hazards to health 08/27/2016   Personal history of radiation therapy    Thyroid goiter     Past Surgical History:  Procedure Laterality Date   BREAST BIOPSY Left 01/2016   BREAST LUMPECTOMY Left 04/2016   BREAST LUMPECTOMY WITH RADIOACTIVE SEED AND SENTINEL LYMPH NODE BIOPSY Left 04/11/2016   Procedure: BREAST LUMPECTOMY WITH RADIOACTIVE SEED AND SENTINEL LYMPH NODE BIOPSY;  Surgeon: Excell Seltzer, MD;  Location: [place];  Service: General;  Laterality: Left;   COLONOSCOPY WITH PROPOFOL N/A 10/20/2015   Procedure: COLONOSCOPY WITH PROPOFOL;  Surgeon: Mauri Pole, MD;  [facility];  Service: Endoscopy;  Laterality: N/A;   ESOPHAGOGASTRODUODENOSCOPY (EGD) WITH PROPOFOL N/A 10/20/2015   Procedure: ESOPHAGOGASTRODUODENOSCOPY (EGD) WITH PROPOFOL;  Surgeon: Mauri Pole, MD;  [facility];  Service: Endoscopy;  Laterality: N/A;   IR ANGIOGRAM PULMONARY BILATERAL SELECTIVE  12/29/2017   IR ANGIOGRAM SELECTIVE EACH ADDITIONAL VESSEL  12/29/2017   IR ANGIOGRAM SELECTIVE EACH ADDITIONAL VESSEL  12/29/2017   IR INFUSION THROMBOL ARTERIAL INITIAL (MS)  12/29/2017   IR INFUSION THROMBOL ARTERIAL INITIAL (MS)  12/29/2017   IR THROMB F/U EVAL ART/VEN FINAL DAY (MS)  12/30/2017   IR US GUIDE VASC ACCESS RIGHT  12/29/2017   LEFT HEART CATHETERIZATION WITH CORONARY  ANGIOGRAM N/A 10/21/2014   Procedure: LEFT HEART CATHETERIZATION WITH CORONARY ANGIOGRAM;  Surgeon: Burnell Blanks, MD;  Location: [facility];  Service: Cardiovascular;  Laterality: N/A;   THYROIDECTOMY N/A 02/26/2017   Procedure: TOTAL THYROIDECTOMY;  Surgeon: Excell Seltzer, MD;  Location: [facility];  Service: General;  Laterality: N/A;   TUBAL LIGATION      Current Medications: No outpatient medications have been marked as taking for the 05/12/21 encounter (Appointment) with Warren Lacy, PA-C.     Allergies:   Cortisone, Effexor [venlafaxine], Lexapro [escitalopram], and Xarelto [rivaroxaban]   Social History   Socioeconomic History   Marital status: Single    Spouse name: Not on file   Number of children: 1   Years of education: 12   Highest education level: Not on file  Occupational History   Occupation: Retired  Tobacco Use   Smoking status: Former    Packs/day: 1.00    Years: 45.00    Pack years: 45.00    Types: Cigarettes    Quit date: 07/11/2014    Years since quitting: 6.8   Smokeless tobacco:  Never  Vaping Use   Vaping Use: Never used  Substance and Sexual Activity   Alcohol use: No    Alcohol/week: 0.0 standard drinks   Drug use: No   Sexual activity: Not on file  Other Topics Concern   Not on file  Social History Narrative   Lives with daughter.    Right-hand.   Occasional use of caffeine.    Social Determinants of Health   Financial Resource Strain: Not on file  Food Insecurity: Not on file  Transportation Needs: Not on file  Physical Activity: Not on file  Stress: Not on file  Social Connections: Not on file     Family History: The patient's family history includes Cataracts in her brother and mother; Diabetes in her mother and sister; Hearing loss in her brother and mother; Heart disease in her mother; Hypertension in her mother; Kidney disease in her mother; Liver disease in her mother. There is no history of Colon cancer or  Thyroid disease.  ROS:   Please see the history of present illness.     EKGs/Labs/Other Studies Reviewed:    EKG:  The ekg ordered today demonstrates ***  Recent Labs: 08/17/2020: TSH 1.27  Recent Lipid Panel    Component Value Date/Time   CHOL 174 07/04/2018 1042   TRIG 55 07/04/2018 1042   HDL 71 07/04/2018 1042   CHOLHDL 2.5 07/04/2018 1042   CHOLHDL 2.6 08/09/2016 1016   VLDL 8 08/09/2016 1016   LDLCALC 92 07/04/2018 1042    Physical Exam:    VS:  There were no vitals taken for this visit.    Wt Readings from Last 3 Encounters:  08/25/20 (!) 307 lb 9.6 oz (139.5 kg)  08/18/20 (!) 310 lb 3.2 oz (140.7 kg)  11/16/19 300 lb (136.1 kg)     GEN: *** Well nourished, well developed in no acute distress HEENT: Normal NECK: No JVD; No carotid bruits CARDIAC: ***RRR, no murmurs, rubs, gallops RESPIRATORY:  Clear to auscultation without rales, wheezing or rhonchi  ABDOMEN: Soft, non-tender, non-distended MUSCULOSKELETAL: No edema; No deformity  SKIN: Warm and dry NEUROLOGIC:  Alert and oriented PSYCHIATRIC:  Normal affect   ASSESSMENT AND PLAN   ***   {Are you ordering a CV Procedure (e.g. stress test, cath, DCCV, TEE, etc)?   Press F2        :[id number]    Medication Adjustments/Labs and Tests Ordered: Current medicines are reviewed at length with the patient today.  Concerns regarding medicines are outlined above.  No orders of the defined types were placed in this encounter.  No orders of the defined types were placed in this encounter.   There are no Patient Instructions on file for this visit.   Signed, Warren Lacy, PA-C  05/11/2021 1:54 PM    [facility]

## 2021-05-12 ENCOUNTER — Ambulatory Visit: Payer: Medicare Other | Admitting: Physician Assistant

## 2021-05-31 ENCOUNTER — Ambulatory Visit: Payer: Medicare Other | Admitting: Podiatry

## 2021-06-13 ENCOUNTER — Other Ambulatory Visit: Payer: Self-pay | Admitting: Hematology and Oncology

## 2021-06-13 ENCOUNTER — Telehealth: Payer: Self-pay | Admitting: Hematology and Oncology

## 2021-06-13 NOTE — Telephone Encounter (Signed)
Per 10/4 sch msg, pt was called and confirmed appt

## 2021-06-14 ENCOUNTER — Other Ambulatory Visit: Payer: Self-pay

## 2021-06-14 ENCOUNTER — Ambulatory Visit (INDEPENDENT_AMBULATORY_CARE_PROVIDER_SITE_OTHER): Payer: Medicare Other | Admitting: Podiatry

## 2021-06-14 ENCOUNTER — Encounter: Payer: Self-pay | Admitting: Podiatry

## 2021-06-14 DIAGNOSIS — M2141 Flat foot [pes planus] (acquired), right foot: Secondary | ICD-10-CM

## 2021-06-14 DIAGNOSIS — M2140 Flat foot [pes planus] (acquired), unspecified foot: Secondary | ICD-10-CM | POA: Insufficient documentation

## 2021-06-14 DIAGNOSIS — L84 Corns and callosities: Secondary | ICD-10-CM | POA: Diagnosis not present

## 2021-06-14 DIAGNOSIS — M2142 Flat foot [pes planus] (acquired), left foot: Secondary | ICD-10-CM

## 2021-06-14 NOTE — Progress Notes (Signed)
This patient presents to the office with pain right heel.  She says she has developed callus on her heel which is painful when walking.  She says she has been soaking her foot in epsom salts.  She has significant lymphededma  B/L.  She also has been taking gabapentin which helps control her right heel pain. She has been diagnosed with chronic anticoagulation and takes eliquis.  She also has history of surgery which she has previously been casted for orthoses.  She presents for evaluation and treatment.  Vascular  Dorsalis pedis  are palpable  B/L. Posterior tibial pulses are absent due to severe swelling both ankles.   Capillary return  WNL.  Temperature gradient is  WNL.  Skin turgor  WNL  Sensorium  Senn Weinstein monofilament wire  WNL. Normal tactile sensation.  Nail Exam  Patient has normal nails with no evidence of bacterial or fungal infection.  Orthopedic  Exam  Muscle tone and muscle strength  WNL.  No limitations of motion feet  B/L.  No crepitus or joint effusion noted.  Foot type is unremarkable and digits show no abnormalities.  Bony prominences are unremarkable.Pes planus  B/L.  Skin  No open lesions.  Normal skin texture and turgor.  Heel callus  B/L.  Heel Callus    Pes Planus  B/L.  IE.   Debrided heel callus with dremel tool.  Discussed her flatfootness with this patient.  Told her about powerstep insoles.  She will look for her previously casted orthoses at home.  RTC prn.  Gardiner Barefoot DPM

## 2021-06-30 NOTE — Progress Notes (Signed)
Cardiology Office Note   Date:  07/07/2021   ID:  Sheila Jacobs, DOB Jun 16, 1951, MRN [id number]  PCP:  Arthur Holms, NP  Cardiologist:  Dr. [name]  CC; Follow Up     History of Present Illness: Sheila Jacobs is a 70 y.o. female who presents for ongoing assessment and management of history of pulmonary embolism which was found on admission in April 2019 revealing acute saddle pulmonary embolism without acute cor pulmonale.  She was placed on Eliquis 5 mg twice daily.  She was last seen by Dr. [name] on 08/18/2020 was doing well with occasional complaints of back pain.   She has other history of OSA and is not always compliant with this, lymphedema, hypertension, and morbid obesity.  She comes today with no new complaints.  She has gained a little bit of weight.  She has some discomfort when she takes deep breaths but not always.  She is not very active.  Past Medical History:  Diagnosis Date   Acute saddle pulmonary embolism without acute cor pulmonale (HCC) 12/29/2017   Anginal pain (HCC)    Arthritis    Breast cancer of upper-outer quadrant of left female breast ([employer]) 01/31/2016   Colon polyps    GERD (gastroesophageal reflux disease)    History of radiation therapy 10/9-11/20/17   Left Breast 45 Gy in 25 fractions, Left Breast Boost 6 Gy in 3 fractions.    Hyperlipidemia    Hypertension    Morbid obesity (HCC)    OSA (obstructive sleep apnea) 01/23/2018   Palpitations 10/16/2016   Personal history of noncompliance with medical treatment, presenting hazards to health 08/27/2016   Personal history of radiation therapy    Thyroid goiter     Past Surgical History:  Procedure Laterality Date   BREAST BIOPSY Left 01/2016   BREAST LUMPECTOMY Left 04/2016   BREAST LUMPECTOMY WITH RADIOACTIVE SEED AND SENTINEL LYMPH NODE BIOPSY Left 04/11/2016   Procedure: BREAST LUMPECTOMY WITH RADIOACTIVE SEED AND SENTINEL LYMPH NODE BIOPSY;  Surgeon: Excell Seltzer, MD;  Location: [place];   Service: General;  Laterality: Left;   COLONOSCOPY WITH PROPOFOL N/A 10/20/2015   Procedure: COLONOSCOPY WITH PROPOFOL;  Surgeon: Mauri Pole, MD;  [facility];  Service: Endoscopy;  Laterality: N/A;   ESOPHAGOGASTRODUODENOSCOPY (EGD) WITH PROPOFOL N/A 10/20/2015   Procedure: ESOPHAGOGASTRODUODENOSCOPY (EGD) WITH PROPOFOL;  Surgeon: Mauri Pole, MD;  [facility];  Service: Endoscopy;  Laterality: N/A;   IR ANGIOGRAM PULMONARY BILATERAL SELECTIVE  12/29/2017   IR ANGIOGRAM SELECTIVE EACH ADDITIONAL VESSEL  12/29/2017   IR ANGIOGRAM SELECTIVE EACH ADDITIONAL VESSEL  12/29/2017   IR INFUSION THROMBOL ARTERIAL INITIAL (MS)  12/29/2017   IR INFUSION THROMBOL ARTERIAL INITIAL (MS)  12/29/2017   IR THROMB F/U EVAL ART/VEN FINAL DAY (MS)  12/30/2017   IR US GUIDE VASC ACCESS RIGHT  12/29/2017   LEFT HEART CATHETERIZATION WITH CORONARY ANGIOGRAM N/A 10/21/2014   Procedure: LEFT HEART CATHETERIZATION WITH CORONARY ANGIOGRAM;  Surgeon: Burnell Blanks, MD;  Location: [facility] LAB;  Service: Cardiovascular;  Laterality: N/A;   THYROIDECTOMY N/A 02/26/2017   Procedure: TOTAL THYROIDECTOMY;  Surgeon: Excell Seltzer, MD;  Location: [facility];  Service: General;  Laterality: N/A;   TUBAL LIGATION       Current Outpatient Medications  Medication Sig Dispense Refill   acetaminophen (TYLENOL) 500 MG tablet Take 500 mg by mouth every 6 (six) hours as needed.     anastrozole (ARIMIDEX) 1 MG tablet TAKE 1 TABLET  BY MOUTH EVERY DAY 90 tablet 0   atorvastatin (LIPITOR) 10 MG tablet Take 10 mg by mouth daily.     cholecalciferol (VITAMIN D3) 25 MCG (1000 UNIT) tablet Take 1,000 Units by mouth daily.     ELIQUIS 5 MG TABS tablet TAKE 1 TABLET BY MOUTH TWICE A DAY 180 tablet 1   gabapentin (NEURONTIN) 100 MG capsule Take 100 mg by mouth 3 (three) times daily.     hydrochlorothiazide (HYDRODIURIL) 25 MG tablet Take 1 tablet (25 mg total) by mouth daily. 90 tablet 3   ketoconazole  (NIZORAL) 2 % cream APPLY TOPICALLY EVERY DAY AS NEEDED FOR IRRITATION 30 g 1   ketoconazole (NIZORAL) 2 % shampoo Apply topically.     levothyroxine (SYNTHROID) 137 MCG tablet TAKE 1 TABLET (137 MCG TOTAL) BY MOUTH DAILY BEFORE BREAKFAST. 90 tablet 2   metoprolol succinate (TOPROL-XL) 50 MG 24 hr tablet Take 50 mg by mouth daily.     omeprazole (PRILOSEC) 20 MG capsule Take 20 mg by mouth in the morning and at bedtime.     potassium chloride (K-DUR) 10 MEQ tablet TAKE 3 TABLETS BY MOUTH THE FIRST 2 DAYS, THEN 1 TABKET ONCE A DAY 35 tablet 0   vitamin B-12 (CYANOCOBALAMIN) 100 MCG tablet Take 100 mcg by mouth daily.     No current facility-administered medications for this visit.    Allergies:   Cortisone, Effexor [venlafaxine], Lexapro [escitalopram], and Xarelto [rivaroxaban]    Social History:  The patient  reports that she quit smoking about 6 years ago. Her smoking use included cigarettes. She has a 45.00 pack-year smoking history. She has never used smokeless tobacco. She reports that she does not drink alcohol and does not use drugs.   Family History:  The patient's family history includes Cataracts in her brother and mother; Diabetes in her mother and sister; Hearing loss in her brother and mother; Heart disease in her mother; Hypertension in her mother; Kidney disease in her mother; Liver disease in her mother.    ROS: All other systems are reviewed and negative. Unless otherwise mentioned in H&P    PHYSICAL EXAM: VS:  BP (!) 150/73   Pulse 83   Ht 5\' 2"  (1.575 m)   Wt (!) 313 lb 9.6 oz (142.2 kg)   SpO2 95%   BMI 57.36 kg/m  , BMI Body mass index is 57.36 kg/m. GEN: Well nourished, well developed, in no acute distress, morbidly obese HEENT: normal Neck: no JVD, carotid bruits, or masses Cardiac: RRR; no murmurs, rubs, or gallops, mild bilateral lymphedema Respiratory:  Clear to auscultation bilaterally, normal work of breathing GI: soft, nontender, nondistended, +  BS MS: no deformity or atrophy Skin: warm and dry, no rash Neuro:  Strength and sensation are intact Psych: euthymic mood, full affect   EKG:  EKG is ordered today. The ekg ordered today demonstrates (personally reviewed) normal sinus rhythm with right ventricular conduction delay, and left axis deviation.  Heart rate of 83 bpm.   Recent Labs: 08/17/2020: TSH 1.27    Lipid Panel    Component Value Date/Time   CHOL 174 07/04/2018 1042   TRIG 55 07/04/2018 1042   HDL 71 07/04/2018 1042   CHOLHDL 2.5 07/04/2018 1042   CHOLHDL 2.6 08/09/2016 1016   VLDL 8 08/09/2016 1016   LDLCALC 92 07/04/2018 1042   Echocardiogram 05/08/2018.   Systolic function was normal. Wall motion was normal; there were no regional wall motion abnormalities. There was an increased relative  contribution of atrial contraction to ventricular filling, which  may be due to aging or hypovolemia. Left ventricular diastolic function parameters were normal.  - Aortic arch: The aortic arch had moderate diffuse disease.    ASSESSMENT AND PLAN:  1.  Hypertension: Elevated today.  No evidence of volume overload currently.  I have counseled her on low-sodium diet and weight loss to assist in reduction of blood pressure and improvement of health.  I have given her "salty 6-Southern" instructions, also walking program instructions, and information on support hose which may be difficult due to her body habitus.  No changes in her medication regimen at this time.  If blood pressure is not well controlled may need to add additional agent to keep BP less than 130/60.  2.  History of saddle PE: Remains on anticoagulation therapy with apixaban 5 mg twice daily.  No complaints of bleeding.  She is due to see her PCP next month for annual labs.  3.  OSA: She reports that she is using CPAP as directed.  With history of noncompliance uncertain if this is completely true but I hope that she has become more consistent as this will also  help her with blood pressure control.  4.  Obesity: I have counseled her on increasing her activity to assist with better stamina, breathing, and weight loss.  She should begin walking a minimum of 15 minutes each day, and slowly work her way up.  I let her know that the more active she becomes, her energy level will increase.  Uncertain if she is a candidate for bariatric consultation/surgery.  Will defer to PCP   Current medicines are reviewed at length with the patient today.  I have spent 25 minutes dedicated to the care of this patient on the date of this encounter to include pre-visit review of records, assessment, management and diagnostic testing,with shared decision making.  Labs/ tests ordered today include: None  Phill Myron. West Pugh, ANP, AACC   07/07/2021 11:59 AM    [address] Office [phone] Fax [phone]  Notice: This dictation was prepared with Dragon dictation along with smaller phrase technology. Any transcriptional errors that result from this process are unintentional and may not be corrected upon review.

## 2021-07-07 ENCOUNTER — Other Ambulatory Visit: Payer: Self-pay

## 2021-07-07 ENCOUNTER — Ambulatory Visit (INDEPENDENT_AMBULATORY_CARE_PROVIDER_SITE_OTHER): Payer: Medicare Other | Admitting: Adult Health

## 2021-07-07 ENCOUNTER — Encounter: Payer: Self-pay | Admitting: Adult Health

## 2021-07-07 VITALS — BP 150/73 | HR 83 | Ht 62.0 in | Wt 313.6 lb

## 2021-07-07 DIAGNOSIS — I2692 Saddle embolus of pulmonary artery without acute cor pulmonale: Secondary | ICD-10-CM

## 2021-07-07 DIAGNOSIS — I517 Cardiomegaly: Secondary | ICD-10-CM

## 2021-07-07 DIAGNOSIS — I10 Essential (primary) hypertension: Secondary | ICD-10-CM

## 2021-07-07 DIAGNOSIS — Z7901 Long term (current) use of anticoagulants: Secondary | ICD-10-CM

## 2021-07-07 DIAGNOSIS — G4733 Obstructive sleep apnea (adult) (pediatric): Secondary | ICD-10-CM

## 2021-07-07 NOTE — Patient Instructions (Signed)
Medication Instructions:  No Changes *If you need a refill on your cardiac medications before your next appointment, please call your pharmacy*   Lab Work: No Labs If you have labs (blood work) drawn today and your tests are completely normal, you will receive your results only by: [place] (if you have MyChart) OR A paper copy in the mail If you have any lab test that is abnormal or we need to change your treatment, we will call you to review the results.   Testing/Procedures: No Testing   Follow-Up: At [facility], you and your health needs are our priority.  As part of our continuing mission to provide you with exceptional heart care, we have created designated Provider Care Teams.  These Care Teams include your primary Cardiologist (physician) and Advanced Practice Providers (APPs -  Physician Assistants and Nurse Practitioners) who all work together to provide you with the care you need, when you need it.  We recommend signing up for the patient portal called "MyChart".  Sign up information is provided on this After Visit Summary.  MyChart is used to connect with patients for Virtual Visits (Telemedicine).  Patients are able to view lab/test results, encounter notes, upcoming appointments, etc.  Non-urgent messages can be sent to your provider as well.   To learn more about what you can do with MyChart, go to [email or web address].    Your next appointment:   1 year(s)  The format for your next appointment:   In Person  Provider:   Minus Breeding, MD

## 2021-07-17 ENCOUNTER — Other Ambulatory Visit: Payer: Self-pay | Admitting: Cardiology

## 2021-07-17 ENCOUNTER — Other Ambulatory Visit: Payer: Self-pay | Admitting: Hematology and Oncology

## 2021-07-17 ENCOUNTER — Other Ambulatory Visit: Payer: Self-pay | Admitting: Endocrinology

## 2021-07-18 NOTE — Telephone Encounter (Signed)
Called and spoke to pt who stated that she recently got blood work at her PCP. Called pt's PCP to fax over the results of the lab work pt recently had done to [phone].

## 2021-07-18 NOTE — Telephone Encounter (Signed)
Prescription refill request for Eliquis received. Indication: PE Last office visit: Sheila Jacobs, 07/07/2021 Scr: 0.85, 09/09/2018 Age: 70 yo  Weight: 142 kg   Pt is overdue for blood work.

## 2021-08-11 ENCOUNTER — Emergency Department (HOSPITAL_COMMUNITY)
Admission: EM | Admit: 2021-08-11 | Discharge: 2021-08-12 | Disposition: A | Discharge status: Home / Self Care | Payer: Medicare Other | Attending: Emergency Medicine | Admitting: Emergency Medicine

## 2021-08-11 ENCOUNTER — Other Ambulatory Visit: Payer: Self-pay

## 2021-08-11 ENCOUNTER — Emergency Department (HOSPITAL_COMMUNITY): Payer: Medicare Other

## 2021-08-11 ENCOUNTER — Encounter (HOSPITAL_COMMUNITY): Payer: Self-pay | Admitting: Emergency Medicine

## 2021-08-11 DIAGNOSIS — Z87891 Personal history of nicotine dependence: Secondary | ICD-10-CM | POA: Insufficient documentation

## 2021-08-11 DIAGNOSIS — R0789 Other chest pain: Secondary | ICD-10-CM | POA: Diagnosis present

## 2021-08-11 DIAGNOSIS — E039 Hypothyroidism, unspecified: Secondary | ICD-10-CM | POA: Diagnosis not present

## 2021-08-11 DIAGNOSIS — R609 Edema, unspecified: Secondary | ICD-10-CM | POA: Insufficient documentation

## 2021-08-11 DIAGNOSIS — Z853 Personal history of malignant neoplasm of breast: Secondary | ICD-10-CM | POA: Insufficient documentation

## 2021-08-11 DIAGNOSIS — R600 Localized edema: Secondary | ICD-10-CM | POA: Insufficient documentation

## 2021-08-11 DIAGNOSIS — K219 Gastro-esophageal reflux disease without esophagitis: Secondary | ICD-10-CM | POA: Insufficient documentation

## 2021-08-11 DIAGNOSIS — Z79899 Other long term (current) drug therapy: Secondary | ICD-10-CM | POA: Insufficient documentation

## 2021-08-11 DIAGNOSIS — I10 Essential (primary) hypertension: Secondary | ICD-10-CM | POA: Diagnosis not present

## 2021-08-11 DIAGNOSIS — Z7901 Long term (current) use of anticoagulants: Secondary | ICD-10-CM | POA: Insufficient documentation

## 2021-08-11 DIAGNOSIS — R918 Other nonspecific abnormal finding of lung field: Secondary | ICD-10-CM

## 2021-08-11 LAB — CBC
HCT: 36.5 % (ref 36.0–46.0)
Hemoglobin: 12.4 g/dL (ref 12.0–15.0)
MCH: 33 pg (ref 26.0–34.0)
MCHC: 34 g/dL (ref 30.0–36.0)
MCV: 97.1 fL (ref 80.0–100.0)
Platelets: 243 10*3/uL (ref 150–400)
RBC: 3.76 MIL/uL — ABNORMAL LOW (ref 3.87–5.11)
RDW: 12.4 % (ref 11.5–15.5)
WBC: 3.5 10*3/uL — ABNORMAL LOW (ref 4.0–10.5)
nRBC: 0 % (ref 0.0–0.2)

## 2021-08-11 LAB — TROPONIN I (HIGH SENSITIVITY)
Troponin I (High Sensitivity): 3 ng/L (ref <18)
Troponin I (High Sensitivity): 3 ng/L (ref <18)

## 2021-08-11 LAB — BASIC METABOLIC PANEL
Anion gap: 8 (ref 5–15)
BUN: 9 mg/dL (ref 8–23)
CO2: 29 mmol/L (ref 22–32)
Calcium: 9.6 mg/dL (ref 8.9–10.3)
Chloride: 102 mmol/L (ref 98–111)
Creatinine, Ser: 0.8 mg/dL (ref 0.44–1.00)
GFR, Estimated: >60 mL/min (ref >60)
Glucose, Bld: 100 mg/dL — ABNORMAL HIGH (ref 70–99)
Potassium: 3.4 mmol/L — ABNORMAL LOW (ref 3.5–5.1)
Sodium: 139 mmol/L (ref 135–145)

## 2021-08-11 LAB — BRAIN NATRIURETIC PEPTIDE: B Natriuretic Peptide: 91.2 pg/mL (ref 0.0–100.0)

## 2021-08-11 IMAGING — CR DG CHEST 2V
2 series · 2 of 2 positions shown · non-contrast
Comparison: [DATE]

CLINICAL DATA: Chest pain

EXAM:
CHEST - 2 VIEW

[chest pa]
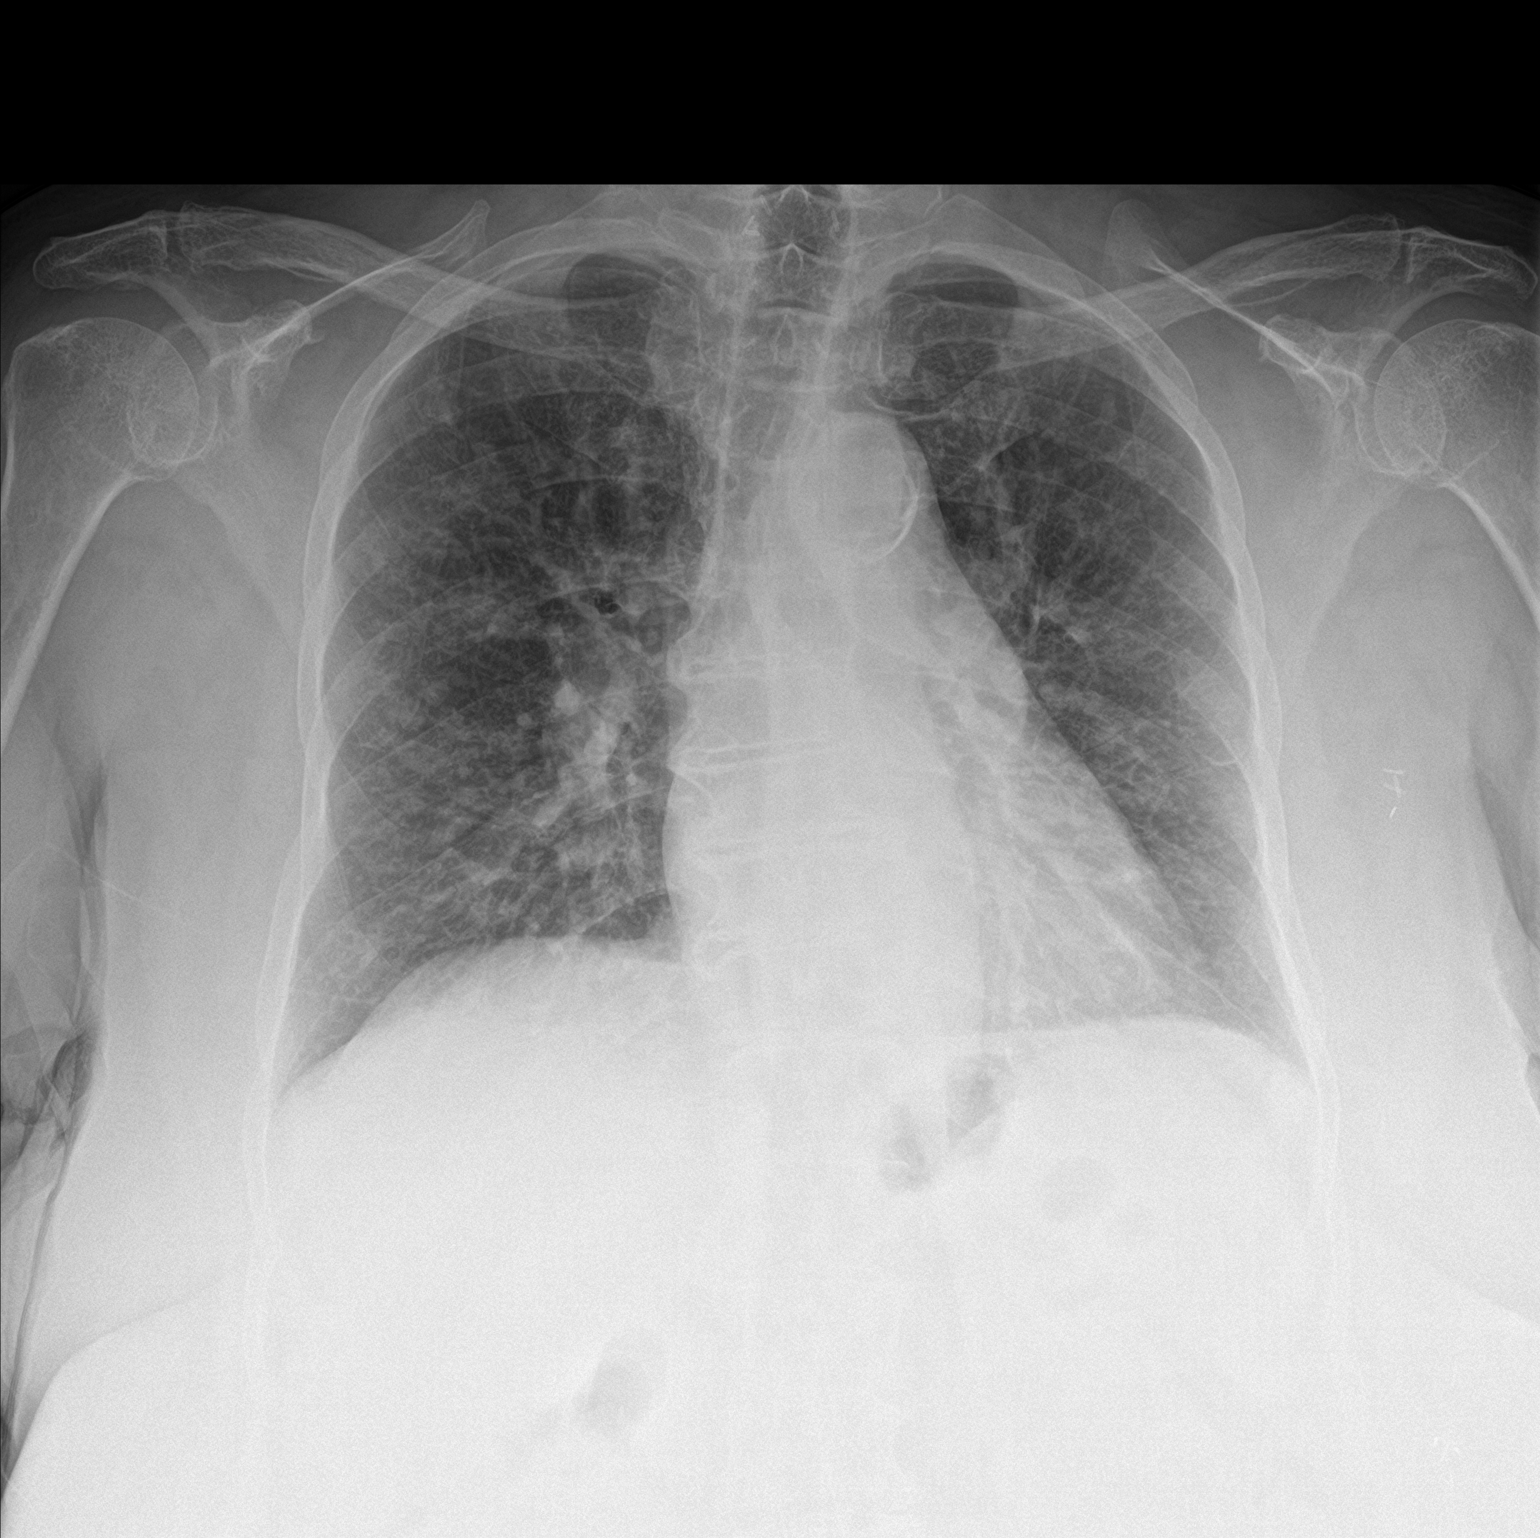

[chest lat]
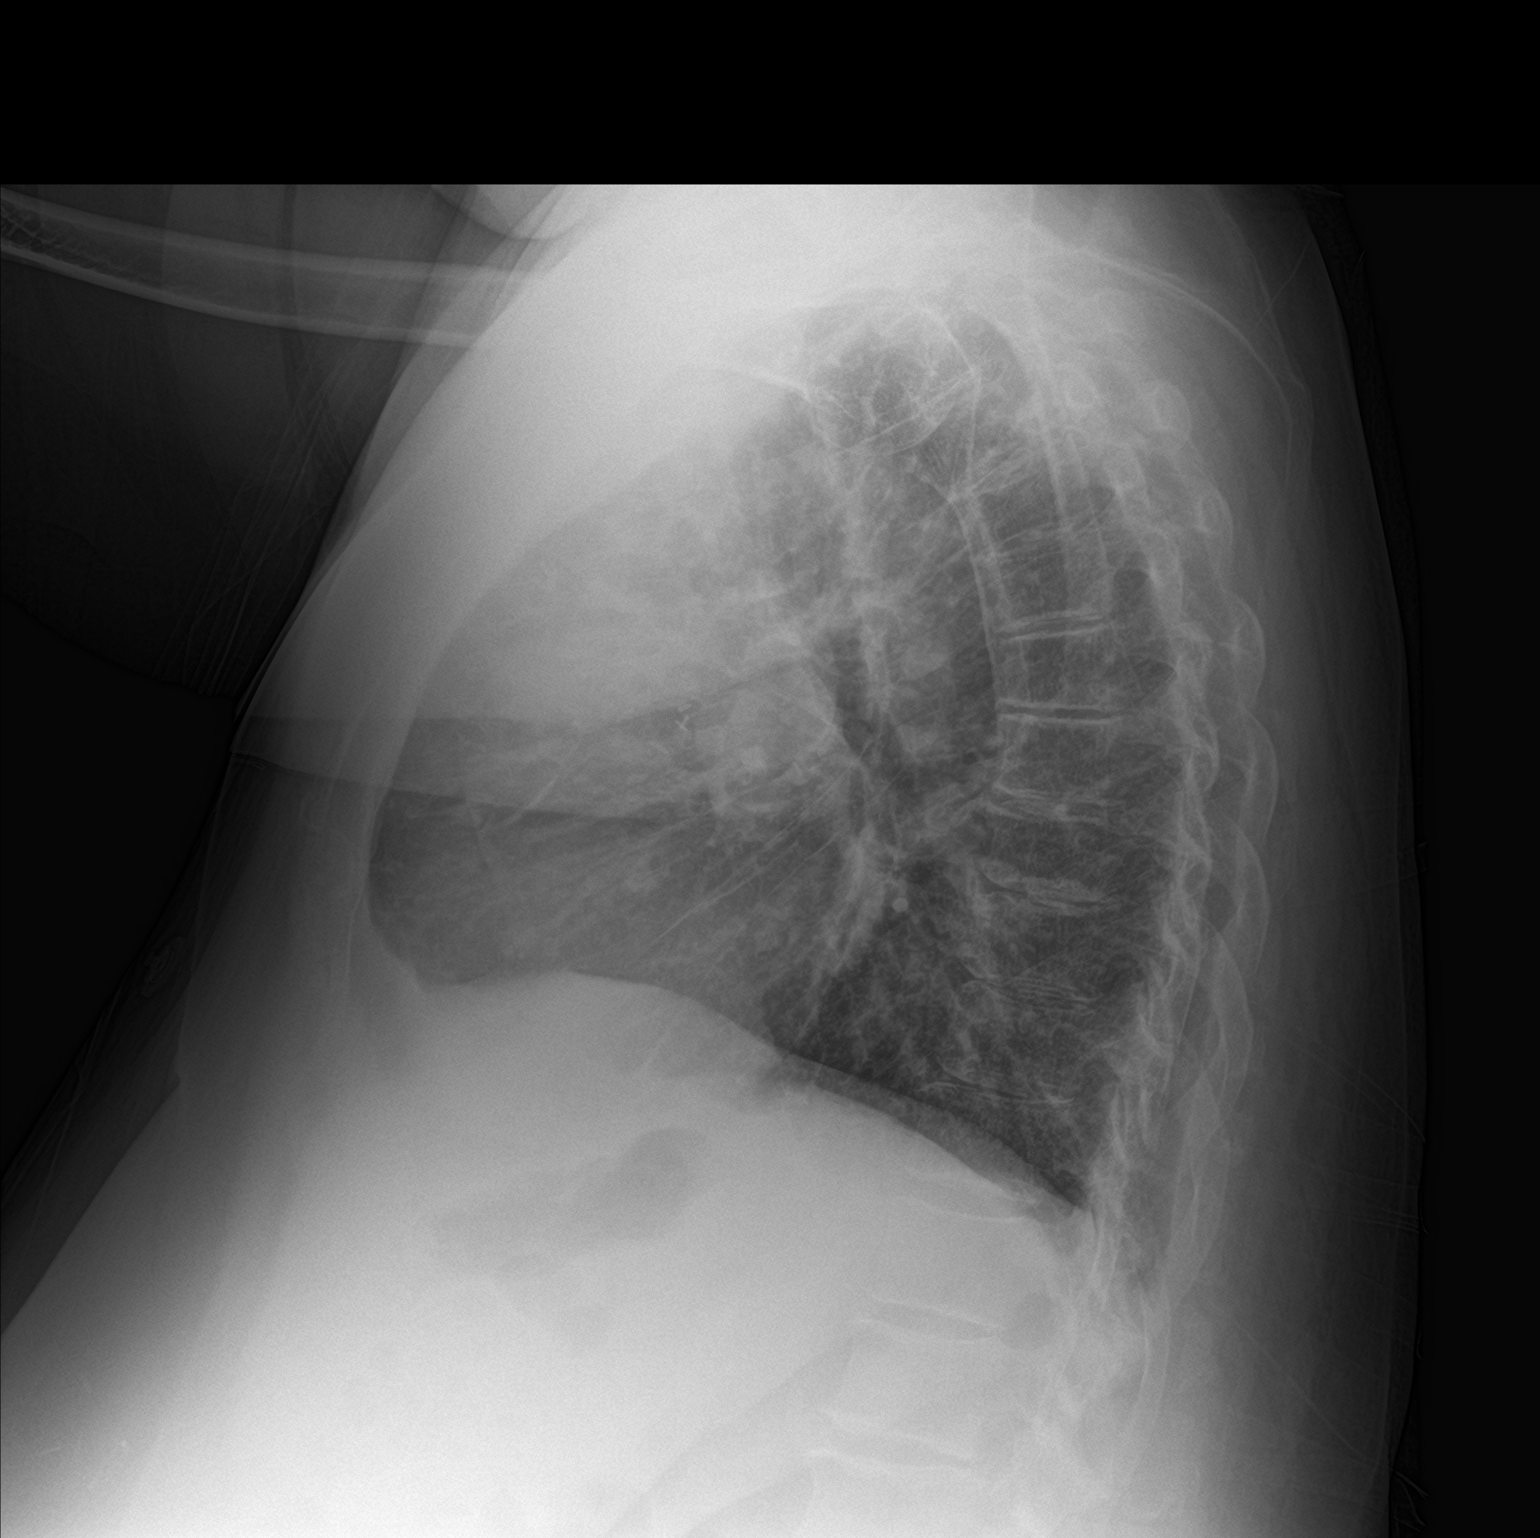

[2 of 2 positions shown; findings below may reference images not displayed]

FINDINGS: The heart size and mediastinal contours are within normal limits.
Chronic appearing interstitial changes. Probable pulmonary arterial
hypertension. No pleural effusion or pneumothorax. The visualized
skeletal structures are unremarkable.
IMPRESSION: No acute process in the chest. Probable pulmonary arterial
hypertension.

## 2021-08-11 NOTE — ED Provider Notes (Signed)
Emergency Medicine Provider Triage Evaluation Note  Sheila Jacobs , a 70 y.o. female  was evaluated in triage.  Pt complains of chest pain.  She states that she has had constant, central chest that radiates to her right arm over the past 2 weeks.  She states that at times she can take a deep breath and that seems to help it.  She denies nausea, vomiting or diaphoresis with the episodes.  She denies shortness of breath with the chest pain.  She does endorse some lower extremity swelling which she says is constant.  Denies fevers or cough.  Review of Systems  Positive:  Negative:   Physical Exam  There were no vitals taken for this visit. Gen:   Awake, no distress   Resp:  Normal effort lungs clear to auscultation MSK:   Moves extremities without difficulty  Other:  S1/S2 without murmur, pulses equal bilaterally.  2+ pitting edema bilateral lower extremities.  Medical Decision Making  Medically screening exam initiated at 4:47 PM.  Appropriate orders placed.  Sheila Jacobs was informed that the remainder of the evaluation will be completed by another provider, this initial triage assessment does not replace that evaluation, and the importance of remaining in the ED until their evaluation is complete.     Mickie Hillier, PA-C 08/11/21 1648    Horton, Alvin Critchley, DO 08/14/21 1046

## 2021-08-11 NOTE — ED Triage Notes (Signed)
Pt reports central CP with deep breath x 2 weeks.  No n/v/d or shob.  Pt wears CPAP at night and states it is worse then.

## 2021-08-12 ENCOUNTER — Emergency Department (HOSPITAL_COMMUNITY): Payer: Medicare Other

## 2021-08-12 DIAGNOSIS — R0789 Other chest pain: Secondary | ICD-10-CM | POA: Diagnosis not present

## 2021-08-12 IMAGING — CT CT ANGIO CHEST
3 of 7 series · 18 of 36 positions shown · IV contrast (omnipaque)
Comparison: Chest radiographs yesterday.  CTA chest [DATE].

CLINICAL DATA: 70-year-old female with chest pain, pleuritic pain.
History of pulmonary embolus in [9Y].

EXAM:
CT ANGIOGRAPHY CHEST WITH CONTRAST
TECHNIQUE: Multidetector CT imaging of the chest was performed using the
standard protocol during bolus administration of intravenous
contrast. Multiplanar CT image reconstructions and MIPs were
obtained to evaluate the vascular anatomy.
CONTRAST:  100mL OMNIPAQUE IOHEXOL 350 MG/ML SOLN

[Series 6: pe lung · axial · 0.67mm/px · z∈[-810,-742]mm · 2 of 136 slices shown]
[im 34/136  mediastinal]
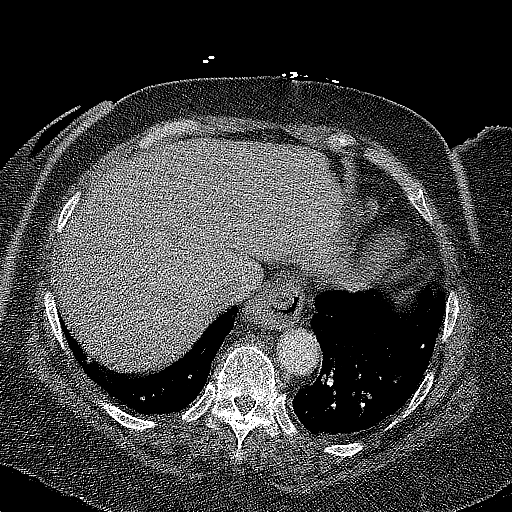
[im 68/136  mediastinal]
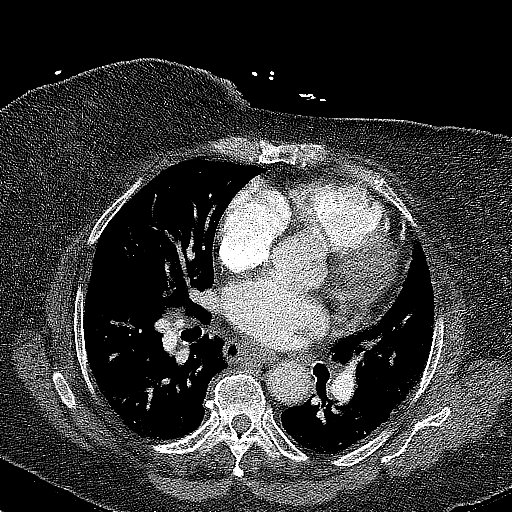

[Series 7: pe thins · axial · 0.67mm/px · z∈[-889,-625]mm · 15 of 431 slices shown]
[im 27/431  lung]
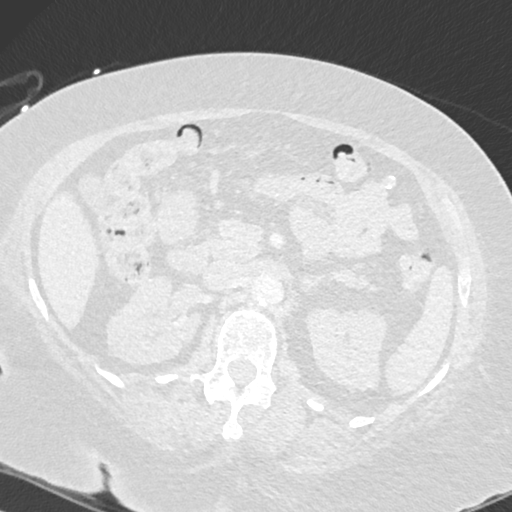
[im 54/431  mediastinal]
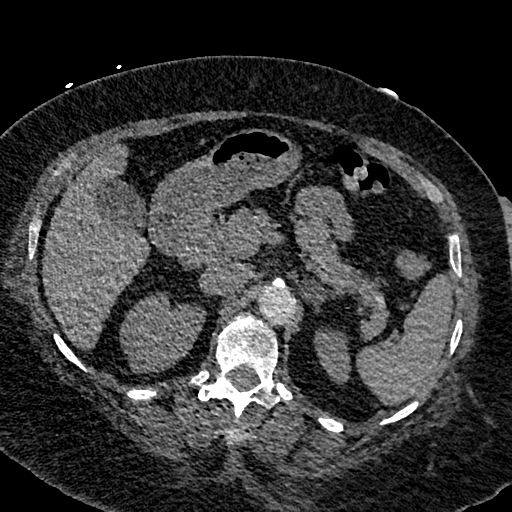
[im 81/431  lung]
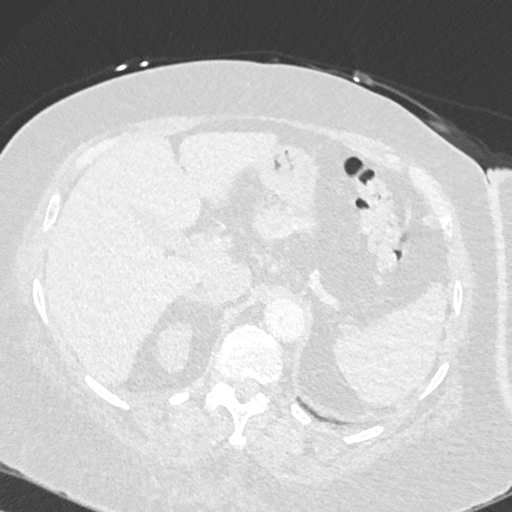
[im 108/431  mediastinal]
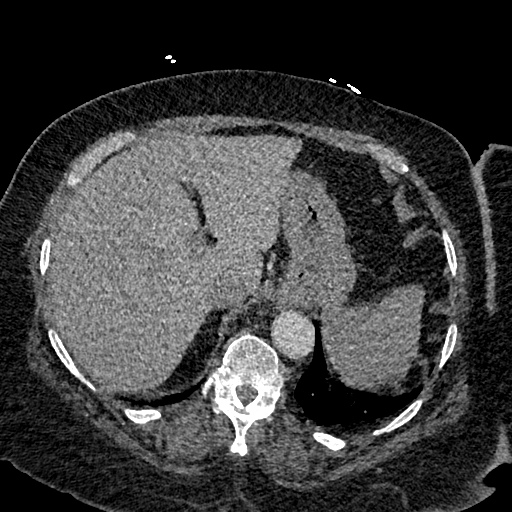
[im 135/431  lung]
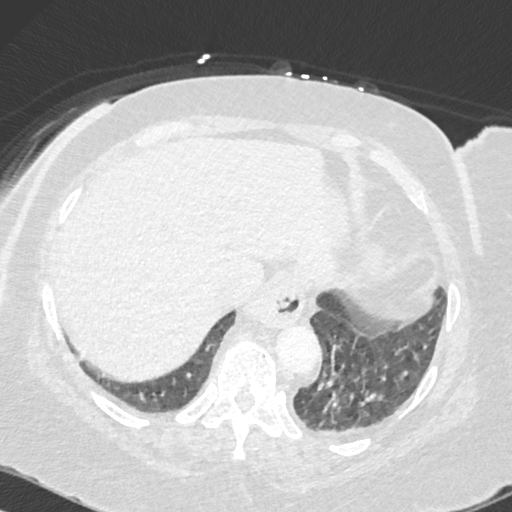
[im 162/431  mediastinal]
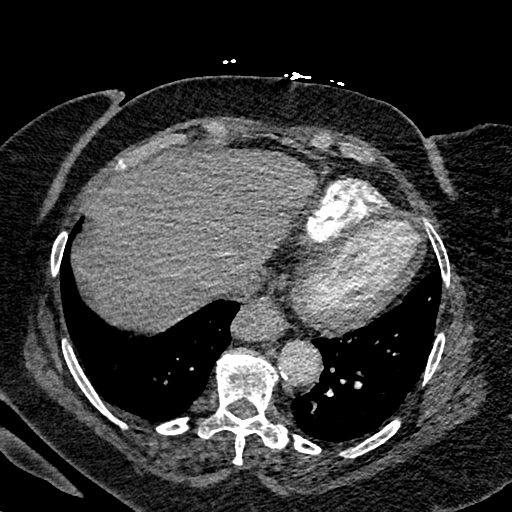
[im 189/431  lung]
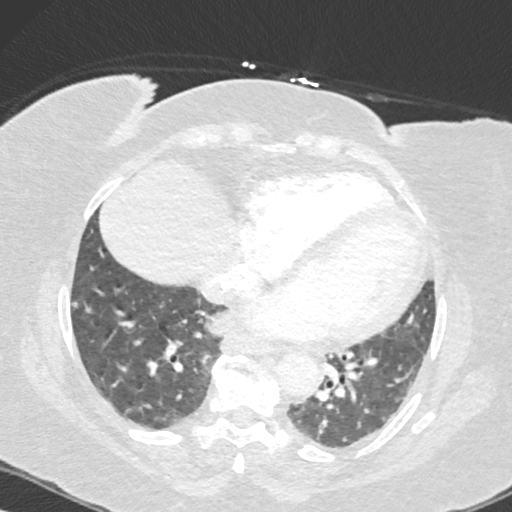
[im 216/431  mediastinal]
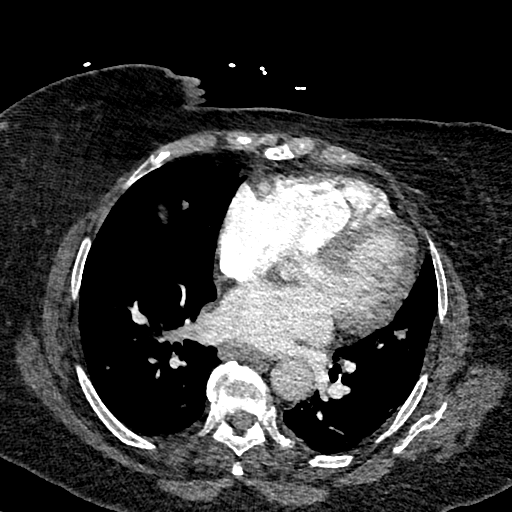
[im 242/431  lung]
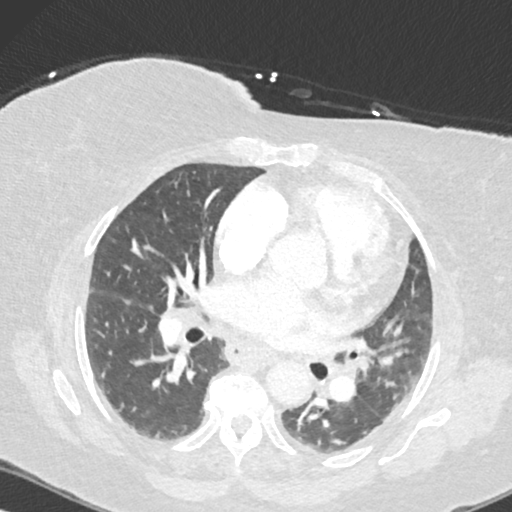
[im 269/431  mediastinal]
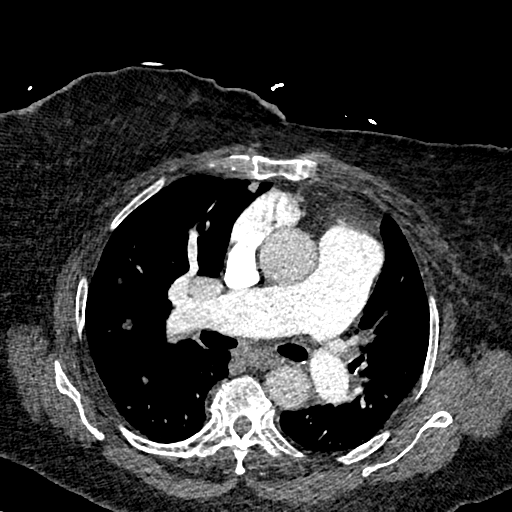
[im 296/431  lung]
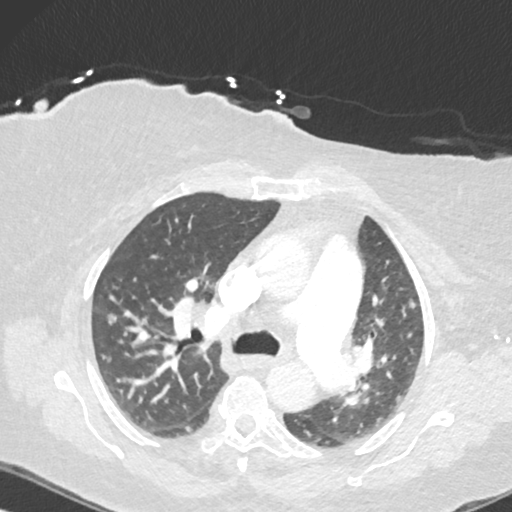
[im 323/431  mediastinal]
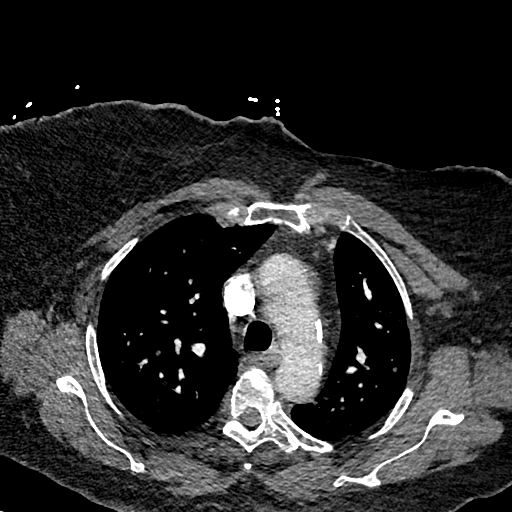
[im 350/431  lung]
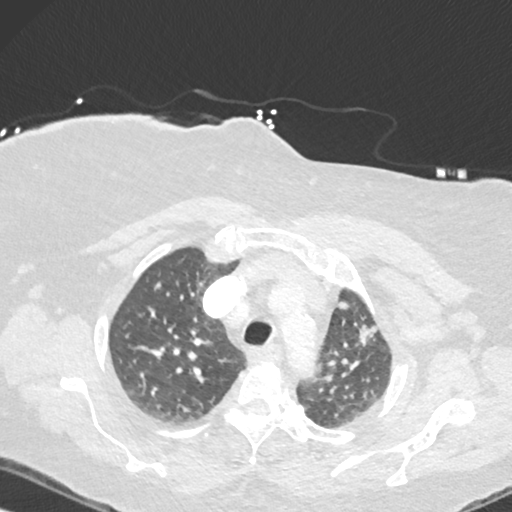
[im 377/431  mediastinal]
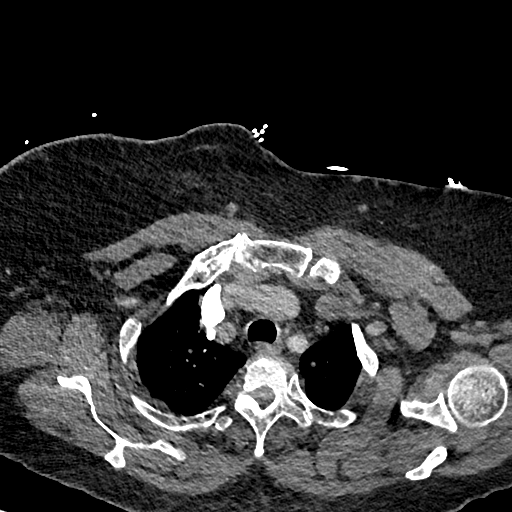
[im 404/431  lung]
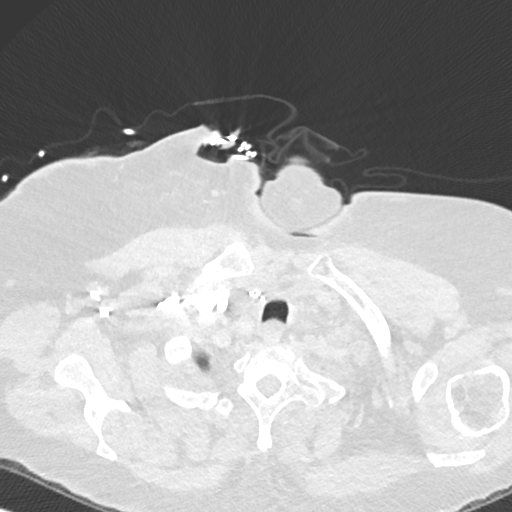

[Series 8: pe 2mm cor · coronal · 0.59mm/px · 1 of 127 slices shown]
[im 64/127  mediastinal]
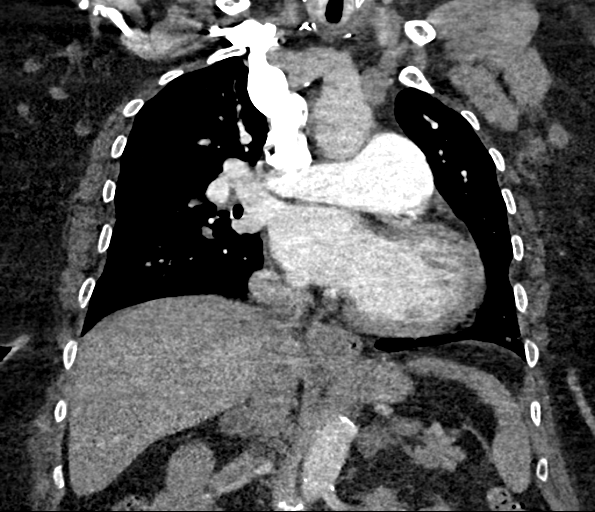

[18 of 36 positions shown; findings below may reference images not displayed]

FINDINGS: Cardiovascular: Adequate contrast bolus timing in the pulmonary
arterial tree.

No focal filling defect identified in the pulmonary arteries to
suggest acute pulmonary embolism.

Central pulmonary artery enlargement appears to be chronic and not
significantly changed from [9Y]. Cardiac size is at the upper limits
of normal. No pericardial effusion. Calcified aortic
atherosclerosis. Calcified coronary artery atherosclerosis is
evident.

Mediastinum/Nodes: No mediastinal or hilar lymphadenopathy.

Right axillary lymph nodes are stable since [9Y] and diminutive.
Left axillary lymph nodes measure up to only 15 mm diameter, but are
increased since [9Y] and have irregular contours. Nearby left
axillary surgical clip.

Lungs/Pleura: Mildly lower lung volumes compared to the [9Y] CTA.
Major airways remain patent.

There are innumerable bilateral pulmonary nodules, new since [9Y].
These are scattered in all lobes ranging from punctate to 12 mm
diameter. Most of the nodules are fairly smooth and circumscribed.
Occasional indistinct and/or spiculated no pleural effusion.
Nodules, such as a lobulated, bilobed 13 mm nodule along the right
minor fissure on series 8, image 31.

Upper Abdomen: Negative visible liver, gallbladder, spleen,
pancreas, adrenal glands, kidneys and intestines. Small gastric
hiatal hernia is larger since [9Y], otherwise negative stomach.

Musculoskeletal: No acute or suspicious osseous lesion is
identified.

Review of the MIP images confirms the above findings.
IMPRESSION: 1. Innumerable bilateral pulmonary nodules are new since [9Y]. Top
differential considerations include pulmonary Metastatic disease
unknown primary (favored) and disseminated Infection. No pleural
effusion.

2. No mediastinal lymphadenopathy, but questionable small malignant
left axillary lymph nodes.

3. No evidence of acute pulmonary evidence of chronic pulmonary
artery hypertension.

4. Aortic Atherosclerosis ([9Y]-[9Y]).

## 2021-08-12 MED ORDER — IOHEXOL 350 MG/ML SOLN
100.0000 mL | Freq: Once | INTRAVENOUS | Status: AC | PRN
  Administered 2021-08-12: 100 mL via INTRAVENOUS

## 2021-08-12 NOTE — ED Provider Notes (Signed)
[facility] EMERGENCY DEPARTMENT Provider Note  CSN: [id number] Arrival date & time: 08/11/21 1522  Chief Complaint(s) Chest Pain  HPI Sheila Jacobs is a 70 y.o. female with a past medical history listed below including saddle pulmonary embolism on anticoagulation who presents to the emergency department with 2 weeks of constant central chest aching radiating to right arm over the past 2 weeks.  At times improves with deep breathing.  Nonexertional, but reports DOE. Additionally patient reports intermittent palpitations.  No recent fevers or infections.  No coughing or congestion.     Chest Pain  Past Medical History Past Medical History:  Diagnosis Date   Acute saddle pulmonary embolism without acute cor pulmonale (HCC) 12/29/2017   Anginal pain (HCC)    Arthritis    Breast cancer of upper-outer quadrant of left female breast ([place]) 01/31/2016   Colon polyps    GERD (gastroesophageal reflux disease)    History of radiation therapy 10/9-11/20/17   Left Breast 45 Gy in 25 fractions, Left Breast Boost 6 Gy in 3 fractions.    Hyperlipidemia    Hypertension    Morbid obesity ([place])    OSA (obstructive sleep apnea) 01/23/2018   Palpitations 10/16/2016   Personal history of noncompliance with medical treatment, presenting hazards to health 08/27/2016   Personal history of radiation therapy    Thyroid goiter    Patient Active Problem List   Diagnosis Date Noted   Heel callus 06/14/2021   Pes planus 06/14/2021   Lymphedema 03/07/2021   Pharyngoesophageal dysphagia 11/26/2019   Bilateral temporomandibular joint pain 03/05/2019   Presbycusis of both ears 03/05/2019   Medication management 10/22/2018   History of pulmonary embolus (PE), 12/2017 10/22/2018   Chronic anticoagulation 10/22/2018   Tinea corporis 08/22/2018   Right leg swelling 08/21/2018   Pain and swelling of ankle, right 04/24/2018   Cough 04/13/2018   Throat pain in adult 03/10/2018   OSA  (obstructive sleep apnea) 01/23/2018   Hypothyroidism    Hypoxia    Acute saddle pulmonary embolism without acute cor pulmonale ([place]) 12/29/2017   History of breast cancer 10/23/2017   Post-surgical hypothyroidism 06/27/2017   Thyroid goiter 02/26/2017   Personal history of noncompliance with medical treatment, presenting hazards to health 08/27/2016   Generalized anxiety disorder 08/09/2016   Abnormal TSH 05/09/2016   Abnormality of gait 01/31/2016   Low back pain 01/31/2016   Breast cancer of upper-outer quadrant of left female breast ([place]) 01/31/2016   Former heavy cigarette smoker (20-39 per day) 11/08/2015   Esophageal reflux    GERD (gastroesophageal reflux disease) 05/03/2015   Dependent edema 05/03/2015   Morbid obesity ([place]) 04/29/2015   Right carotid bruit 10/06/2014   Atypical chest pain 09/18/2014   Hypertension 09/18/2014   Hyperlipidemia 09/18/2014   Enlarged LA (left atrium)    Home Medication(s) Prior to Admission medications   Medication Sig Start Date End Date Taking? Authorizing Provider  acetaminophen (TYLENOL) 500 MG tablet Take 500 mg by mouth every 6 (six) hours as needed.    [provider]  anastrozole (ARIMIDEX) 1 MG tablet TAKE 1 TABLET BY MOUTH EVERY DAY 07/18/21   Nicholas Lose, MD  atorvastatin (LIPITOR) 10 MG tablet Take 10 mg by mouth daily. 08/30/20   [provider]  cholecalciferol (VITAMIN D3) 25 MCG (1000 UNIT) tablet Take 1,000 Units by mouth daily.    [provider]  ELIQUIS 5 MG TABS tablet TAKE 1 TABLET BY MOUTH TWICE A DAY 07/19/21  Minus Breeding, MD  gabapentin (NEURONTIN) 100 MG capsule Take 100 mg by mouth 3 (three) times daily. 08/28/20   [provider]  hydrochlorothiazide (HYDRODIURIL) 25 MG tablet Take 1 tablet (25 mg total) by mouth daily. 07/17/18   Kathrene Alu, MD  ketoconazole (NIZORAL) 2 % cream APPLY TOPICALLY EVERY DAY AS NEEDED FOR IRRITATION 03/02/19   Kathrene Alu, MD   ketoconazole (NIZORAL) 2 % shampoo Apply topically. 08/28/20   [provider]  levothyroxine (SYNTHROID) 137 MCG tablet TAKE 1 TABLET BY MOUTH DAILY BEFORE BREAKFAST. 07/18/21   Elayne Snare, MD  metoprolol succinate (TOPROL-XL) 50 MG 24 hr tablet Take 50 mg by mouth daily. 06/22/20   [provider]  omeprazole (PRILOSEC) 20 MG capsule Take 20 mg by mouth in the morning and at bedtime.    [provider]  potassium chloride (K-DUR) 10 MEQ tablet TAKE 3 TABLETS BY MOUTH THE FIRST 2 DAYS, THEN 1 TABKET ONCE A DAY 02/05/19   Barrett, Evelene Croon, PA-C  vitamin B-12 (CYANOCOBALAMIN) 100 MCG tablet Take 100 mcg by mouth daily.    [provider]                                                                                                                                    Past Surgical History Past Surgical History:  Procedure Laterality Date   BREAST BIOPSY Left 01/2016   BREAST LUMPECTOMY Left 04/2016   BREAST LUMPECTOMY WITH RADIOACTIVE SEED AND SENTINEL LYMPH NODE BIOPSY Left 04/11/2016   Procedure: BREAST LUMPECTOMY WITH RADIOACTIVE SEED AND SENTINEL LYMPH NODE BIOPSY;  Surgeon: Excell Seltzer, MD;  Location: [facility];  Service: General;  Laterality: Left;   COLONOSCOPY WITH PROPOFOL N/A 10/20/2015   Procedure: COLONOSCOPY WITH PROPOFOL;  Surgeon: Mauri Pole, MD;  Location: [facility];  Service: Endoscopy;  Laterality: N/A;   ESOPHAGOGASTRODUODENOSCOPY (EGD) WITH PROPOFOL N/A 10/20/2015   Procedure: ESOPHAGOGASTRODUODENOSCOPY (EGD) WITH PROPOFOL;  Surgeon: Mauri Pole, MD;  Location: [facility];  Service: Endoscopy;  Laterality: N/A;   IR ANGIOGRAM PULMONARY BILATERAL SELECTIVE  12/29/2017   IR ANGIOGRAM SELECTIVE EACH ADDITIONAL VESSEL  12/29/2017   IR ANGIOGRAM SELECTIVE EACH ADDITIONAL VESSEL  12/29/2017   IR INFUSION THROMBOL ARTERIAL INITIAL (MS)  12/29/2017   IR INFUSION THROMBOL ARTERIAL INITIAL (MS)  12/29/2017   IR THROMB F/U EVAL ART/VEN  FINAL DAY (MS)  12/30/2017   IR US GUIDE VASC ACCESS RIGHT  12/29/2017   LEFT HEART CATHETERIZATION WITH CORONARY ANGIOGRAM N/A 10/21/2014   Procedure: LEFT HEART CATHETERIZATION WITH CORONARY ANGIOGRAM;  Surgeon: Burnell Blanks, MD;  Location: [facility] CATH LAB;  Service: Cardiovascular;  Laterality: N/A;   THYROIDECTOMY N/A 02/26/2017   Procedure: TOTAL THYROIDECTOMY;  Surgeon: Excell Seltzer, MD;  Location: [facility];  Service: General;  Laterality: N/A;   TUBAL LIGATION     Family History Family History  Problem Relation Age of Onset  Hypertension Mother    Heart disease Mother        Pacemaker   Kidney disease Mother    Cataracts Mother    Hearing loss Mother    Diabetes Mother    Liver disease Mother    Diabetes Sister    Cataracts Brother    Hearing loss Brother    Colon cancer Neg Hx    Thyroid disease Neg Hx     Social History Social History   Tobacco Use   Smoking status: Former    Packs/day: 1.00    Years: 45.00    Pack years: 45.00    Types: Cigarettes    Quit date: 07/11/2014    Years since quitting: 7.0   Smokeless tobacco: Never  Vaping Use   Vaping Use: Never used  Substance Use Topics   Alcohol use: No    Alcohol/week: 0.0 standard drinks   Drug use: No   Allergies Cortisone, Effexor [venlafaxine], Lexapro [escitalopram], and Xarelto [rivaroxaban]  Review of Systems Review of Systems  Cardiovascular:  Positive for chest pain.  All other systems are reviewed and are negative for acute change except as noted in the HPI  Physical Exam Vital Signs  I have reviewed the triage vital signs BP (!) 144/60   Pulse 67   Temp 98.2 F (36.8 C) (Oral)   Resp 15   SpO2 100%   Physical Exam Vitals reviewed.  Constitutional:      General: Sheila Jacobs is not in acute distress.    Appearance: Sheila Jacobs is well-developed. Sheila Jacobs is obese. Sheila Jacobs is not diaphoretic.  HENT:     Head: Normocephalic and atraumatic.     Nose: Nose normal.  Eyes:     General: No scleral  icterus.       Right eye: No discharge.        Left eye: No discharge.     Conjunctiva/sclera: Conjunctivae normal.     Pupils: Pupils are equal, round, and reactive to light.  Cardiovascular:     Rate and Rhythm: Normal rate and regular rhythm.     Heart sounds: No murmur heard.   No friction rub. No gallop.  Pulmonary:     Effort: Pulmonary effort is normal. No respiratory distress.     Breath sounds: Normal breath sounds. No stridor. No rales.  Abdominal:     General: There is no distension.     Palpations: Abdomen is soft.     Tenderness: There is no abdominal tenderness.  Musculoskeletal:        General: No tenderness.     Cervical back: Normal range of motion and neck supple.     Right lower leg: 1+ Pitting Edema present.     Left lower leg: 1+ Pitting Edema present.  Skin:    General: Skin is warm and dry.     Findings: No erythema or rash.  Neurological:     Mental Status: Sheila Jacobs is alert and oriented to person, place, and time.    ED Results and Treatments Labs (all labs ordered are listed, but only abnormal results are displayed) Labs Reviewed  BASIC METABOLIC PANEL - Abnormal; Notable for the following components:      Result Value   Potassium 3.4 (*)    Glucose, Bld 100 (*)    All other components within normal limits  CBC - Abnormal; Notable for the following components:   WBC 3.5 (*)    RBC 3.76 (*)    All other components within normal  limits  BRAIN NATRIURETIC PEPTIDE  TROPONIN I (HIGH SENSITIVITY)  TROPONIN I (HIGH SENSITIVITY)                                                                                                                         EKG  EKG Interpretation  Date/Time:  Friday August 11 2021 16:23:29 EST Ventricular Rate:  68 PR Interval:  202 QRS Duration: 88 QT Interval:  400 QTC Calculation: 425 R Axis:   -27 Text Interpretation: Normal sinus rhythm Nonspecific T wave abnormality Abnormal ECG improve TWI from prior Confirmed by  Addison Lank [phone]) on 08/12/2021 4:54:33 AM       Radiology DG Chest 2 View  Result Date: 08/11/2021 CLINICAL DATA:  Chest pain EXAM: CHEST - 2 VIEW COMPARISON:  April 2019 FINDINGS: The heart size and mediastinal contours are within normal limits. Chronic appearing interstitial changes. Probable pulmonary arterial hypertension. No pleural effusion or pneumothorax. The visualized skeletal structures are unremarkable. IMPRESSION: No acute process in the chest. Probable pulmonary arterial hypertension. Electronically Signed   By: Macy Mis M.D.   On: 08/11/2021 17:59    Pertinent labs & imaging results that were available during my care of the patient were reviewed by me and considered in my medical decision making (see MDM for details).  Medications Ordered in ED Medications - No data to display                                                                                                                                   Procedures Procedures  (including critical care time)  Medical Decision Making / ED Course I have reviewed the nursing notes for this encounter and the patient's prior records (if available in EHR or on provided paperwork).  ERICKA MARCELLUS was evaluated in Emergency Department on 08/12/2021 for the symptoms described in the history of present illness. Sheila Jacobs was evaluated in the context of the global COVID-19 pandemic, which necessitated consideration that the patient might be at risk for infection with the SARS-CoV-2 virus that causes COVID-19. Institutional protocols and algorithms that pertain to the evaluation of patients at risk for COVID-19 are in a state of rapid change based on information released by regulatory bodies including the CDC and federal and state organizations. These policies and algorithms were followed during the patient's care in the ED.     Atypical chest pain. EKG without acute  ischemic changes or evidence of pericarditis.. Serial  troponins negative -effectively ruling out ACS given her clinical features. Presentation not classic pleuritic dissection or esophageal perforation.  Given the constant central chest discomfort with dyspnea on exertion and a history of PE, will obtain an CTA to rule out recurrence.  Pertinent labs & imaging results that were available during my care of the patient were reviewed by me and considered in my medical decision making:  Patient care turned over to oncoming provider. Patient case and results discussed in detail; please see their note for further ED managment.     Final Clinical Impression(s) / ED Diagnoses Final diagnoses:  None     This chart was dictated using voice recognition software.  Despite best efforts to proofread,  errors can occur which can change the documentation meaning.    Fatima Blank, MD 08/12/21 [phone]

## 2021-08-12 NOTE — ED Notes (Signed)
Pt found  

## 2021-08-12 NOTE — ED Notes (Signed)
The pt had chest pain for several days  none now  she had 2 sl nitro at home that stopped the pain

## 2021-08-12 NOTE — ED Provider Notes (Addendum)
Patient initially seen by Dr. Leonette Monarch.  Please see his note.  Patient's been having some issues with palpitations but also dyspnea on exertion.  CT angiogram performed to evaluate for the possibility of pulmonary embolism.  CT does not show PE but it does show new numerous pulmonary nodules.  Presentation concerning for the possibility of metastatic CVA versus infection.  Currently afebrile.  No looks ptosis.  We will consult with medical service regarding possible admission for further work-up and evaluation   Dorie Rank, MD 08/12/21 [phone]  Patient was seen by internal medicine service in the ED.  Discussed options with the patient and she would actually prefer to go home.  Patient does have scheduled follow-up with Dr. Lindi Adie her oncologist.  I will send him a message to let him know of these CAT scans results    Dorie Rank, MD 08/12/21 1129

## 2021-08-12 NOTE — ED Notes (Signed)
Pt called twice for room assignment with no response

## 2021-08-12 NOTE — ED Notes (Signed)
Pt called x 3 .  °

## 2021-08-12 NOTE — Discharge Instructions (Signed)
The CT scan today showed nodules as we discussed concerning for the possibility of cancer.  Follow-up with Dr. Lindi Adie to discuss further evaluation.  Return as needed for worsening symptoms

## 2021-08-12 NOTE — ED Notes (Signed)
Patient transported to CT 

## 2021-08-12 NOTE — Consult Note (Signed)
Date: 08/12/2021               Patient Name:  Sheila Jacobs MRN: [id number]  DOB: October 08, 1950 Age / Sex: 70 y.o., female   PCP: Arthur Holms, NP         Requesting Physician: Dr. Dorie Rank, MD    Consulting Reason:  Pulmonary metastasis      Chief Complaint: Chest Pain   History of Present Illness:   Sheila Jacobs is a 70 year old female with a past medical history of breast cancer (IDC, Dx 2017), saddle PE, hypothyroidism, and hypertension who presents to the ED with complaints of chest pain.  Sheila Jacobs states that for approximately 2 weeks, she has been experiencing intermittent chest pain that feels like a sharp needle sensation.  The pain does not always occur in the same area each time.  She states the pain "kind of feels like a palpitation".  She notes that her chest pain has resolved since coming to the ED.  She denies any shortness of breath or dyspnea on exertion.  She denies any fever, chills, cough, congestion.  Meds: No current facility-administered medications for this encounter.   Current Outpatient Medications  Medication Sig Dispense Refill   acetaminophen (TYLENOL) 500 MG tablet Take 500 mg by mouth every 6 (six) hours as needed.     anastrozole (ARIMIDEX) 1 MG tablet TAKE 1 TABLET BY MOUTH EVERY DAY 90 tablet 0   atorvastatin (LIPITOR) 10 MG tablet Take 10 mg by mouth daily.     cholecalciferol (VITAMIN D3) 25 MCG (1000 UNIT) tablet Take 1,000 Units by mouth daily.     ELIQUIS 5 MG TABS tablet TAKE 1 TABLET BY MOUTH TWICE A DAY 180 tablet 1   gabapentin (NEURONTIN) 100 MG capsule Take 100 mg by mouth 3 (three) times daily.     hydrochlorothiazide (HYDRODIURIL) 25 MG tablet Take 1 tablet (25 mg total) by mouth daily. 90 tablet 3   ketoconazole (NIZORAL) 2 % cream APPLY TOPICALLY EVERY DAY AS NEEDED FOR IRRITATION 30 g 1   ketoconazole (NIZORAL) 2 % shampoo Apply topically.     levothyroxine (SYNTHROID) 137 MCG tablet TAKE 1 TABLET BY MOUTH DAILY BEFORE  BREAKFAST. 90 tablet 2   metoprolol succinate (TOPROL-XL) 50 MG 24 hr tablet Take 50 mg by mouth daily.     omeprazole (PRILOSEC) 20 MG capsule Take 20 mg by mouth in the morning and at bedtime.     potassium chloride (K-DUR) 10 MEQ tablet TAKE 3 TABLETS BY MOUTH THE FIRST 2 DAYS, THEN 1 TABKET ONCE A DAY 35 tablet 0   vitamin B-12 (CYANOCOBALAMIN) 100 MCG tablet Take 100 mcg by mouth daily.     Allergies: Allergies as of 08/11/2021 - Review Complete 08/11/2021  Allergen Reaction Noted   Cortisone Rash 10/06/2014   Effexor [venlafaxine] Palpitations 10/04/2016   Lexapro [escitalopram] Palpitations 08/27/2016   Xarelto [rivaroxaban] Rash 07/22/2018   Past Medical History:  Diagnosis Date   Acute saddle pulmonary embolism without acute cor pulmonale (HCC) 12/29/2017   Anginal pain (HCC)    Arthritis    Breast cancer of upper-outer quadrant of left female breast ([place]) 01/31/2016   Colon polyps    GERD (gastroesophageal reflux disease)    History of radiation therapy 10/9-11/20/17   Left Breast 45 Gy in 25 fractions, Left Breast Boost 6 Gy in 3 fractions.    Hyperlipidemia    Hypertension    Morbid obesity (HCC)    OSA (obstructive sleep  apnea) 01/23/2018   Palpitations 10/16/2016   Personal history of noncompliance with medical treatment, presenting hazards to health 08/27/2016   Personal history of radiation therapy    Thyroid goiter    Past Surgical History:  Procedure Laterality Date   BREAST BIOPSY Left 01/2016   BREAST LUMPECTOMY Left 04/2016   BREAST LUMPECTOMY WITH RADIOACTIVE SEED AND SENTINEL LYMPH NODE BIOPSY Left 04/11/2016   Procedure: BREAST LUMPECTOMY WITH RADIOACTIVE SEED AND SENTINEL LYMPH NODE BIOPSY;  Surgeon: Excell Seltzer, MD;  Location: [place];  Service: General;  Laterality: Left;   COLONOSCOPY WITH PROPOFOL N/A 10/20/2015   Procedure: COLONOSCOPY WITH PROPOFOL;  Surgeon: Mauri Pole, MD;  Location: [facility];  Service: Endoscopy;  Laterality: N/A;    ESOPHAGOGASTRODUODENOSCOPY (EGD) WITH PROPOFOL N/A 10/20/2015   Procedure: ESOPHAGOGASTRODUODENOSCOPY (EGD) WITH PROPOFOL;  Surgeon: Mauri Pole, MD;  Location: [facility];  Service: Endoscopy;  Laterality: N/A;   IR ANGIOGRAM PULMONARY BILATERAL SELECTIVE  12/29/2017   IR ANGIOGRAM SELECTIVE EACH ADDITIONAL VESSEL  12/29/2017   IR ANGIOGRAM SELECTIVE EACH ADDITIONAL VESSEL  12/29/2017   IR INFUSION THROMBOL ARTERIAL INITIAL (MS)  12/29/2017   IR INFUSION THROMBOL ARTERIAL INITIAL (MS)  12/29/2017   IR THROMB F/U EVAL ART/VEN FINAL DAY (MS)  12/30/2017   IR US GUIDE VASC ACCESS RIGHT  12/29/2017   LEFT HEART CATHETERIZATION WITH CORONARY ANGIOGRAM N/A 10/21/2014   Procedure: LEFT HEART CATHETERIZATION WITH CORONARY ANGIOGRAM;  Surgeon: Burnell Blanks, MD;  Location: [facility];  Service: Cardiovascular;  Laterality: N/A;   THYROIDECTOMY N/A 02/26/2017   Procedure: TOTAL THYROIDECTOMY;  Surgeon: Excell Seltzer, MD;  Location: [facility];  Service: General;  Laterality: N/A;   TUBAL LIGATION     Family History  Problem Relation Age of Onset   Hypertension Mother    Heart disease Mother        Pacemaker   Kidney disease Mother    Cataracts Mother    Hearing loss Mother    Diabetes Mother    Liver disease Mother    Diabetes Sister    Cataracts Brother    Hearing loss Brother    Colon cancer Neg Hx    Thyroid disease Neg Hx    Social History:  Lives in [place], [place].  PCP: Arthur Holms at [facility]   Review of Systems: Pertinent items noted in HPI and remainder of comprehensive ROS otherwise negative.  Physical Exam: Blood pressure (!) 121/55, pulse 73, temperature 98.2 F (36.8 C), temperature source Oral, resp. rate (!) 21, SpO2 98 %. BP (!) 121/55   Pulse 73   Temp 98.2 F (36.8 C) (Oral)   Resp (!) 21   SpO2 98%   Physical Exam Vitals and nursing note reviewed.  Constitutional:      General: She is not in acute distress.    Appearance: She is obese.   HENT:     Head: Normocephalic and atraumatic.  Eyes:     Extraocular Movements: Extraocular movements intact.     Pupils: Pupils are equal, round, and reactive to light.  Pulmonary:     Effort: Pulmonary effort is normal. No respiratory distress.  Skin:    General: Skin is warm and dry.  Neurological:     General: No focal deficit present.     Mental Status: She is alert and oriented to person, place, and time.  Psychiatric:        Mood and Affect: Mood normal. Mood is not anxious.  Behavior: Behavior normal. Behavior is not agitated.   Lab results: CBC    Component Value Date/Time   WBC 3.5 (L) 08/11/2021 1640   RBC 3.76 (L) 08/11/2021 1640   HGB 12.4 08/11/2021 1640   HGB 12.4 10/22/2018 1030   HGB 11.2 (L) 08/29/2016 0856   HCT 36.5 08/11/2021 1640   HCT 35.2 10/22/2018 1030   HCT 33.9 (L) 08/29/2016 0856   PLT 243 08/11/2021 1640   PLT 227 10/22/2018 1030   MCV 97.1 08/11/2021 1640   MCV 93 10/22/2018 1030   MCV 95.0 08/29/2016 0856   MCH 33.0 08/11/2021 1640   MCHC 34.0 08/11/2021 1640   RDW 12.4 08/11/2021 1640   RDW 12.2 10/22/2018 1030   RDW 12.6 08/29/2016 0856   LYMPHSABS 1.1 01/02/2018 2057   LYMPHSABS 1.3 08/29/2016 0856   MONOABS 0.4 01/02/2018 2057   MONOABS 0.3 08/29/2016 0856   EOSABS 0.2 01/02/2018 2057   EOSABS 0.1 08/29/2016 0856   BASOSABS 0.0 01/02/2018 2057   BASOSABS 0.0 08/29/2016 0856   Imaging results:  DG Chest 2 View  Result Date: 08/11/2021 CLINICAL DATA:  Chest pain EXAM: CHEST - 2 VIEW COMPARISON:  April 2019 FINDINGS: The heart size and mediastinal contours are within normal limits. Chronic appearing interstitial changes. Probable pulmonary arterial hypertension. No pleural effusion or pneumothorax. The visualized skeletal structures are unremarkable. IMPRESSION: No acute process in the chest. Probable pulmonary arterial hypertension. Electronically Signed   By: Macy Mis M.D.   On: 08/11/2021 17:59   CT Angio Chest PE W  and/or Wo Contrast  Result Date: 08/12/2021 CLINICAL DATA:  70 year old female with chest pain, pleuritic pain. History of pulmonary embolus in 2019. EXAM: CT ANGIOGRAPHY CHEST WITH CONTRAST TECHNIQUE: Multidetector CT imaging of the chest was performed using the standard protocol during bolus administration of intravenous contrast. Multiplanar CT image reconstructions and MIPs were obtained to evaluate the vascular anatomy. CONTRAST:  151mL OMNIPAQUE IOHEXOL 350 MG/ML SOLN COMPARISON:  Chest radiographs yesterday.  CTA chest 01/28/2018. FINDINGS: Cardiovascular: Adequate contrast bolus timing in the pulmonary arterial tree. No focal filling defect identified in the pulmonary arteries to suggest acute pulmonary embolism. Central pulmonary artery enlargement appears to be chronic and not significantly changed from 2019. Cardiac size is at the upper limits of normal. No pericardial effusion. Calcified aortic atherosclerosis. Calcified coronary artery atherosclerosis is evident. Mediastinum/Nodes: No mediastinal or hilar lymphadenopathy. Right axillary lymph nodes are stable since 2019 and diminutive. Left axillary lymph nodes measure up to only 15 mm diameter, but are increased since 2019 and have irregular contours. Nearby left axillary surgical clip. Lungs/Pleura: Mildly lower lung volumes compared to the 2019 CTA. Major airways remain patent. There are innumerable bilateral pulmonary nodules, new since 2019. These are scattered in all lobes ranging from punctate to 12 mm diameter. Most of the nodules are fairly smooth and circumscribed. Occasional indistinct and/or spiculated no pleural effusion. Nodules, such as a lobulated, bilobed 13 mm nodule along the right minor fissure on series 8, image 31. Upper Abdomen: Negative visible liver, gallbladder, spleen, pancreas, adrenal glands, kidneys and intestines. Small gastric hiatal hernia is larger since 2019, otherwise negative stomach. Musculoskeletal: No acute or  suspicious osseous lesion is identified. Review of the MIP images confirms the above findings. IMPRESSION: 1. Innumerable bilateral pulmonary nodules are new since 2019. Top differential considerations include pulmonary Metastatic disease unknown primary (favored) and disseminated Infection. No pleural effusion. 2. No mediastinal lymphadenopathy, but questionable small malignant left axillary lymph nodes. 3.  No evidence of acute pulmonary evidence of chronic pulmonary artery hypertension. 4. Aortic Atherosclerosis (ICD10-I70.0). Electronically Signed   By: Genevie Ann M.D.   On: 08/12/2021 08:39    Other results: EKG: Sinus rhythm with rate of 68.  Compared to prior EKG, no significant changes.  No ST or T wave changes concerning for acute ischemia.  Assessment, Plan, & Recommendations by Problem:  # Metastatic Disease of Unknown Primary Patient presenting with chest pain.  Initial concern for ACS, however troponins negative x2. Given prior history of saddle PE, CTA was obtained.  No evidence of PE, however innumerable pulmonary nodules were discovered that are new compared to CTA approximately 3 years ago.  Largest nodule up to 12 mm.  Given age and past medical history, suspect this is metastatic disease.  Patient will need biopsy for confirmation of primary, IR versus endobronchial.  Patient already has appointment scheduled with her oncologist Dr. Lindi Adie.  Given she is hemodynamically stable with no hypoxia or active symptoms at this time, she is stable for further work-up in the outpatient setting.  I discussed this with Ms. Ingrum, and she expressed that she would prefer to have further work-up at home rather than in hospital.  Discussed with Dr. Tomi Bamberger.   Signed: Dr. Jose Persia Internal Medicine PGY-3  Pager: [phone] After 5pm on weekdays and 1pm on weekends: On Call pager [phone]  08/12/2021, 11:31 AM

## 2021-08-14 ENCOUNTER — Other Ambulatory Visit: Payer: Self-pay | Admitting: *Deleted

## 2021-08-14 DIAGNOSIS — R911 Solitary pulmonary nodule: Secondary | ICD-10-CM

## 2021-08-14 NOTE — Progress Notes (Signed)
Per MD request RN placed referral to Dr. Valeta Harms pulmonologist for further evaluation and biopsy of new bilateral lung nodules.

## 2021-08-15 ENCOUNTER — Encounter: Payer: Self-pay | Admitting: *Deleted

## 2021-08-15 NOTE — Progress Notes (Signed)
Per MD request, RN successfully faxed referral to Dr. Valeta Harms [phone]).

## 2021-08-22 ENCOUNTER — Other Ambulatory Visit: Payer: Medicare Other

## 2021-08-24 ENCOUNTER — Ambulatory Visit: Payer: Medicare Other | Admitting: Endocrinology

## 2021-08-28 ENCOUNTER — Telehealth: Payer: Self-pay | Admitting: Hematology and Oncology

## 2021-08-28 NOTE — Telephone Encounter (Signed)
Scheduled per sch msg. Called and was not able to leave msg. Mailed printout

## 2021-08-29 ENCOUNTER — Ambulatory Visit: Payer: Medicare Other | Admitting: Hematology and Oncology

## 2021-08-30 ENCOUNTER — Institutional Professional Consult (permissible substitution): Payer: Medicare Other | Admitting: Pulmonary Disease

## 2021-09-06 ENCOUNTER — Telehealth: Payer: Self-pay | Admitting: Pulmonary Disease

## 2021-09-06 NOTE — Telephone Encounter (Signed)
ATC patient to get her scheduled for an OV with Dr. Valeta Harms since she cancelled her appt the other day. Unable to leave VM due to mailbox being full.   Please see if we can get patient rescheduled

## 2021-09-18 ENCOUNTER — Other Ambulatory Visit: Payer: Self-pay | Admitting: Endocrinology

## 2021-09-18 DIAGNOSIS — E890 Postprocedural hypothyroidism: Secondary | ICD-10-CM

## 2021-09-19 ENCOUNTER — Other Ambulatory Visit: Payer: Commercial Managed Care - HMO

## 2021-09-20 ENCOUNTER — Other Ambulatory Visit: Payer: Commercial Managed Care - HMO

## 2021-09-20 ENCOUNTER — Other Ambulatory Visit: Payer: Self-pay | Admitting: Hematology and Oncology

## 2021-09-20 DIAGNOSIS — Z9889 Other specified postprocedural states: Secondary | ICD-10-CM

## 2021-09-21 ENCOUNTER — Ambulatory Visit: Payer: Commercial Managed Care - HMO | Admitting: Endocrinology

## 2021-09-21 ENCOUNTER — Other Ambulatory Visit: Payer: Self-pay

## 2021-09-21 ENCOUNTER — Other Ambulatory Visit (INDEPENDENT_AMBULATORY_CARE_PROVIDER_SITE_OTHER): Payer: Commercial Managed Care - HMO

## 2021-09-21 DIAGNOSIS — E890 Postprocedural hypothyroidism: Secondary | ICD-10-CM | POA: Diagnosis not present

## 2021-09-21 LAB — T4, FREE: Free T4: 1.42 ng/dL (ref 0.60–1.60)

## 2021-09-21 LAB — TSH: TSH: 0.48 u[IU]/mL (ref 0.35–5.50)

## 2021-09-22 ENCOUNTER — Institutional Professional Consult (permissible substitution): Payer: Medicare Other | Admitting: Pulmonary Disease

## 2021-09-22 ENCOUNTER — Telehealth: Payer: Self-pay | Admitting: Pulmonary Disease

## 2021-09-22 NOTE — Progress Notes (Deleted)
Synopsis: Referred in January 2023 for lung nodules by Nicholas Lose, MD  Subjective:   PATIENT ID: Sheila Jacobs GENDER: female DOB: Nov 11, 1950, MRN: [id number]  No chief complaint on file.   This is a 71 year old female, past medical history of pulmonary embolism, breast cancer of the left breast in 2017, history of radiation therapy, not currently undergoing therapy.,  Hypertension, hyperlipidemia.  Patient was recently seen in the emergency department had a CT scan of the chest which revealed innumerable bilateral pulmonary nodules concerning for metastatic disease.  These nodules were all new since 2019.  Largest of which is approximately 13 mm in diameter.  Patient was referred here to consider tissue biopsy of nodules.   Oncology History  Breast cancer of upper-outer quadrant of left female breast ([place])  01/27/2016 Initial Diagnosis   Screening mammogram: Left breast mass 9 mm, grade 1-2 IDC with DCIS, ER 100%, PR 90%, Ki-67 10%, HER-2 negative ratio 1.5, T1 BN 0 stage IA clinical stage   04/11/2016 Surgery   Left lumpectomy ([facility]): IDC grade 3, 1.2 cm, IG DCIS, LVI present, 0/8 lymph nodes negative, ER 100%, PR 90%, HER-2 negative, Ki-67 10%, T1 cN0 stage I a pathologic stage   06/18/2016 - 07/30/2016 Radiation Therapy   Adj XRT Isidore Moos): 1) Left Breast / 45 Gy in 25 fractions.  2) Left Breast Boost /  6 Gy in 3 fractions   08/29/2016 -  Anti-estrogen oral therapy   Anastrozole daily      Past Medical History:  Diagnosis Date   Acute saddle pulmonary embolism without acute cor pulmonale (HCC) 12/29/2017   Anginal pain (HCC)    Arthritis    Breast cancer of upper-outer quadrant of left female breast ([place]) 01/31/2016   Colon polyps    GERD (gastroesophageal reflux disease)    History of radiation therapy 10/9-11/20/17   Left Breast 45 Gy in 25 fractions, Left Breast Boost 6 Gy in 3 fractions.    Hyperlipidemia    Hypertension    Morbid obesity (HCC)    OSA  (obstructive sleep apnea) 01/23/2018   Palpitations 10/16/2016   Personal history of noncompliance with medical treatment, presenting hazards to health 08/27/2016   Personal history of radiation therapy    Thyroid goiter      Family History  Problem Relation Age of Onset   Hypertension Mother    Heart disease Mother        Pacemaker   Kidney disease Mother    Cataracts Mother    Hearing loss Mother    Diabetes Mother    Liver disease Mother    Diabetes Sister    Cataracts Brother    Hearing loss Brother    Colon cancer Neg Hx    Thyroid disease Neg Hx      Past Surgical History:  Procedure Laterality Date   BREAST BIOPSY Left 01/2016   BREAST LUMPECTOMY Left 04/2016   BREAST LUMPECTOMY WITH RADIOACTIVE SEED AND SENTINEL LYMPH NODE BIOPSY Left 04/11/2016   Procedure: BREAST LUMPECTOMY WITH RADIOACTIVE SEED AND SENTINEL LYMPH NODE BIOPSY;  Surgeon: Excell Seltzer, MD;  Location: [place];  Service: General;  Laterality: Left;   COLONOSCOPY WITH PROPOFOL N/A 10/20/2015   Procedure: COLONOSCOPY WITH PROPOFOL;  Surgeon: Mauri Pole, MD;  [facility];  Service: Endoscopy;  Laterality: N/A;   ESOPHAGOGASTRODUODENOSCOPY (EGD) WITH PROPOFOL N/A 10/20/2015   Procedure: ESOPHAGOGASTRODUODENOSCOPY (EGD) WITH PROPOFOL;  Surgeon: Mauri Pole, MD;  [facility]: [facility];  Service: Endoscopy;  Laterality: N/A;   IR ANGIOGRAM PULMONARY BILATERAL SELECTIVE  12/29/2017   IR ANGIOGRAM SELECTIVE EACH ADDITIONAL VESSEL  12/29/2017   IR ANGIOGRAM SELECTIVE EACH ADDITIONAL VESSEL  12/29/2017   IR INFUSION THROMBOL ARTERIAL INITIAL (MS)  12/29/2017   IR INFUSION THROMBOL ARTERIAL INITIAL (MS)  12/29/2017   IR THROMB F/U EVAL ART/VEN FINAL DAY (MS)  12/30/2017   IR US GUIDE VASC ACCESS RIGHT  12/29/2017   LEFT HEART CATHETERIZATION WITH CORONARY ANGIOGRAM N/A 10/21/2014   Procedure: LEFT HEART CATHETERIZATION WITH CORONARY ANGIOGRAM;  Surgeon: Burnell Blanks, MD;  Location[facility] CATH  LAB;  Service: Cardiovascular;  Laterality: N/A;   THYROIDECTOMY N/A 02/26/2017   Procedure: TOTAL THYROIDECTOMY;  Surgeon: Excell Seltzer, MD;  Location: [facility];  Service: General;  Laterality: N/A;   TUBAL LIGATION      Social History   Socioeconomic History   Marital status: Single    Spouse name: Not on file   Number of children: 1   Years of education: 12   Highest education level: Not on file  Occupational History   Occupation: Retired  Tobacco Use   Smoking status: Former    Packs/day: 1.00    Years: 45.00    Pack years: 45.00    Types: Cigarettes    Quit date: 07/11/2014    Years since quitting: 7.2   Smokeless tobacco: Never  Vaping Use   Vaping Use: Never used  Substance and Sexual Activity   Alcohol use: No    Alcohol/week: 0.0 standard drinks   Drug use: No   Sexual activity: Not on file  Other Topics Concern   Not on file  Social History Narrative   Lives with daughter.    Right-hand.   Occasional use of caffeine.    Social Determinants of Health   Financial Resource Strain: Not on file  Food Insecurity: Not on file  Transportation Needs: Not on file  Physical Activity: Not on file  Stress: Not on file  Social Connections: Not on file  Intimate Partner Violence: Not on file     Allergies  Allergen Reactions   Cortisone Rash   Effexor [Venlafaxine] Palpitations   Lexapro [Escitalopram] Palpitations    Heart racing   Xarelto [Rivaroxaban] Rash     Outpatient Medications Prior to Visit  Medication Sig Dispense Refill   acetaminophen (TYLENOL) 500 MG tablet Take 500 mg by mouth every 6 (six) hours as needed.     anastrozole (ARIMIDEX) 1 MG tablet TAKE 1 TABLET BY MOUTH EVERY DAY 90 tablet 0   atorvastatin (LIPITOR) 10 MG tablet Take 10 mg by mouth daily.     cholecalciferol (VITAMIN D3) 25 MCG (1000 UNIT) tablet Take 1,000 Units by mouth daily.     ELIQUIS 5 MG TABS tablet TAKE 1 TABLET BY MOUTH TWICE A DAY 180 tablet 1   gabapentin  (NEURONTIN) 100 MG capsule Take 100 mg by mouth 3 (three) times daily.     hydrochlorothiazide (HYDRODIURIL) 25 MG tablet Take 1 tablet (25 mg total) by mouth daily. 90 tablet 3   ketoconazole (NIZORAL) 2 % cream APPLY TOPICALLY EVERY DAY AS NEEDED FOR IRRITATION 30 g 1   ketoconazole (NIZORAL) 2 % shampoo Apply topically.     levothyroxine (SYNTHROID) 137 MCG tablet TAKE 1 TABLET BY MOUTH DAILY BEFORE BREAKFAST. 90 tablet 2   metoprolol succinate (TOPROL-XL) 50 MG 24 hr tablet Take 50 mg by mouth daily.     omeprazole (PRILOSEC) 20 MG capsule Take 20  mg by mouth in the morning and at bedtime.     potassium chloride (K-DUR) 10 MEQ tablet TAKE 3 TABLETS BY MOUTH THE FIRST 2 DAYS, THEN 1 TABKET ONCE A DAY 35 tablet 0   vitamin B-12 (CYANOCOBALAMIN) 100 MCG tablet Take 100 mcg by mouth daily.     No facility-administered medications prior to visit.    ROS   Objective:  Physical Exam   There were no vitals filed for this visit.   on *** LPM *** RA BMI Readings from Last 3 Encounters:  07/07/21 57.36 kg/m  08/25/20 54.49 kg/m  08/18/20 54.95 kg/m   Wt Readings from Last 3 Encounters:  07/07/21 (!) 313 lb 9.6 oz (142.2 kg)  08/25/20 (!) 307 lb 9.6 oz (139.5 kg)  08/18/20 (!) 310 lb 3.2 oz (140.7 kg)     CBC    Component Value Date/Time   WBC 3.5 (L) 08/11/2021 1640   RBC 3.76 (L) 08/11/2021 1640   HGB 12.4 08/11/2021 1640   HGB 12.4 10/22/2018 1030   HGB 11.2 (L) 08/29/2016 0856   HCT 36.5 08/11/2021 1640   HCT 35.2 10/22/2018 1030   HCT 33.9 (L) 08/29/2016 0856   PLT 243 08/11/2021 1640   PLT 227 10/22/2018 1030   MCV 97.1 08/11/2021 1640   MCV 93 10/22/2018 1030   MCV 95.0 08/29/2016 0856   MCH 33.0 08/11/2021 1640   MCHC 34.0 08/11/2021 1640   RDW 12.4 08/11/2021 1640   RDW 12.2 10/22/2018 1030   RDW 12.6 08/29/2016 0856   LYMPHSABS 1.1 01/02/2018 2057   LYMPHSABS 1.3 08/29/2016 0856   MONOABS 0.4 01/02/2018 2057   MONOABS 0.3 08/29/2016 0856   EOSABS 0.2  01/02/2018 2057   EOSABS 0.1 08/29/2016 0856   BASOSABS 0.0 01/02/2018 2057   BASOSABS 0.0 08/29/2016 0856    ***  Chest Imaging: ***  Pulmonary Functions Testing Results: No flowsheet data found.  FeNO: ***  Pathology: ***  Echocardiogram: ***  Heart Catheterization: ***    Assessment & Plan:     ICD-10-CM   1. Multiple pulmonary nodules  R91.8     2. History of breast cancer  Z85.3     3. Malignant neoplasm of upper-outer quadrant of left breast in female, estrogen receptor positive ([place])  C50.412    Z17.0       Discussion: ***   Current Outpatient Medications:    acetaminophen (TYLENOL) 500 MG tablet, Take 500 mg by mouth every 6 (six) hours as needed., Disp: , Rfl:    anastrozole (ARIMIDEX) 1 MG tablet, TAKE 1 TABLET BY MOUTH EVERY DAY, Disp: 90 tablet, Rfl: 0   atorvastatin (LIPITOR) 10 MG tablet, Take 10 mg by mouth daily., Disp: , Rfl:    cholecalciferol (VITAMIN D3) 25 MCG (1000 UNIT) tablet, Take 1,000 Units by mouth daily., Disp: , Rfl:    ELIQUIS 5 MG TABS tablet, TAKE 1 TABLET BY MOUTH TWICE A DAY, Disp: 180 tablet, Rfl: 1   gabapentin (NEURONTIN) 100 MG capsule, Take 100 mg by mouth 3 (three) times daily., Disp: , Rfl:    hydrochlorothiazide (HYDRODIURIL) 25 MG tablet, Take 1 tablet (25 mg total) by mouth daily., Disp: 90 tablet, Rfl: 3   ketoconazole (NIZORAL) 2 % cream, APPLY TOPICALLY EVERY DAY AS NEEDED FOR IRRITATION, Disp: 30 g, Rfl: 1   ketoconazole (NIZORAL) 2 % shampoo, Apply topically., Disp: , Rfl:    levothyroxine (SYNTHROID) 137 MCG tablet, TAKE 1 TABLET BY MOUTH DAILY BEFORE BREAKFAST., Disp:  90 tablet, Rfl: 2   metoprolol succinate (TOPROL-XL) 50 MG 24 hr tablet, Take 50 mg by mouth daily., Disp: , Rfl:    omeprazole (PRILOSEC) 20 MG capsule, Take 20 mg by mouth in the morning and at bedtime., Disp: , Rfl:    potassium chloride (K-DUR) 10 MEQ tablet, TAKE 3 TABLETS BY MOUTH THE FIRST 2 DAYS, THEN 1 TABKET ONCE A DAY, Disp: 35 tablet, Rfl:  0   vitamin B-12 (CYANOCOBALAMIN) 100 MCG tablet, Take 100 mcg by mouth daily., Disp: , Rfl:   I spent *** minutes dedicated to the care of this patient on the date of this encounter to include pre-visit review of records, face-to-face time with the patient discussing conditions above, post visit ordering of testing, clinical documentation with the electronic health record, making appropriate referrals as documented, and communicating necessary findings to members of the patients care team.   Garner Nash, DO [facility] 09/22/2021 8:39 AM

## 2021-09-22 NOTE — Progress Notes (Signed)
Patient Care Team: Arthur Holms, NP as PCP - General (Nurse Practitioner) Minus Breeding, MD as PCP - Cardiology (Cardiology) Sueanne Margarita, MD as PCP - Sleep Medicine (Cardiology) Nicholas Lose, MD as Consulting Physician (Hematology and Oncology) Excell Seltzer, MD (Inactive) as Consulting Physician (General Surgery) Gardenia Phlegm, NP as Nurse Practitioner (Hematology and Oncology) Eppie Gibson, MD as Attending Physician (Radiation Oncology)  DIAGNOSIS:    ICD-10-CM   1. Malignant neoplasm of upper-outer quadrant of left breast in female, estrogen receptor positive ([place])  C50.412    Z17.0       SUMMARY OF ONCOLOGIC HISTORY: Oncology History  Breast cancer of upper-outer quadrant of left female breast ([place])  01/27/2016 Initial Diagnosis   Screening mammogram: Left breast mass 9 mm, grade 1-2 IDC with DCIS, ER 100%, PR 90%, Ki-67 10%, HER-2 negative ratio 1.5, T1 BN 0 stage IA clinical stage   04/11/2016 Surgery   Left lumpectomy ([facility]): IDC grade 3, 1.2 cm, IG DCIS, LVI present, 0/8 lymph nodes negative, ER 100%, PR 90%, HER-2 negative, Ki-67 10%, T1 cN0 stage I a pathologic stage   06/18/2016 - 07/30/2016 Radiation Therapy   Adj XRT Isidore Moos): 1) Left Breast / 45 Gy in 25 fractions.  2) Left Breast Boost /  6 Gy in 3 fractions   08/29/2016 -  Anti-estrogen oral therapy   Anastrozole daily     CHIEF COMPLIANT: Follow-up of left breast cancer  INTERVAL HISTORY: Sheila Jacobs is a 71 y.o. with above-mentioned history of  left breast cancer treated with lumpectomy, radiation, and is currently on anastrozole therapy. She presents to the clinic today for follow-up.  She underwent a recent CT chest for PE protocol which revealed multiple bilateral lung nodules.  She was referred to [place] for discussion and treatment options. She reports to me that she has been staying active and has been trying to lose weight.  ALLERGIES:  is allergic to cortisone, effexor  [venlafaxine], lexapro [escitalopram], and xarelto [rivaroxaban].  MEDICATIONS:  Current Outpatient Medications  Medication Sig Dispense Refill   acetaminophen (TYLENOL) 500 MG tablet Take 500 mg by mouth every 6 (six) hours as needed.     anastrozole (ARIMIDEX) 1 MG tablet TAKE 1 TABLET BY MOUTH EVERY DAY 90 tablet 0   atorvastatin (LIPITOR) 10 MG tablet Take 10 mg by mouth daily.     cholecalciferol (VITAMIN D3) 25 MCG (1000 UNIT) tablet Take 1,000 Units by mouth daily.     ELIQUIS 5 MG TABS tablet TAKE 1 TABLET BY MOUTH TWICE A DAY 180 tablet 1   gabapentin (NEURONTIN) 100 MG capsule Take 100 mg by mouth 3 (three) times daily.     hydrochlorothiazide (HYDRODIURIL) 25 MG tablet Take 1 tablet (25 mg total) by mouth daily. 90 tablet 3   ketoconazole (NIZORAL) 2 % cream APPLY TOPICALLY EVERY DAY AS NEEDED FOR IRRITATION 30 g 1   ketoconazole (NIZORAL) 2 % shampoo Apply topically.     levothyroxine (SYNTHROID) 137 MCG tablet TAKE 1 TABLET BY MOUTH DAILY BEFORE BREAKFAST. 90 tablet 2   metoprolol succinate (TOPROL-XL) 50 MG 24 hr tablet Take 50 mg by mouth daily.     omeprazole (PRILOSEC) 20 MG capsule Take 20 mg by mouth in the morning and at bedtime.     potassium chloride (K-DUR) 10 MEQ tablet TAKE 3 TABLETS BY MOUTH THE FIRST 2 DAYS, THEN 1 TABKET ONCE A DAY 35 tablet 0   vitamin B-12 (CYANOCOBALAMIN) 100 MCG tablet Take 100 mcg  by mouth daily.     No current facility-administered medications for this visit.    PHYSICAL EXAMINATION: ECOG PERFORMANCE STATUS: 1 - Symptomatic but completely ambulatory  Vitals:   09/25/21 1545  BP: (!) 172/71  Pulse: 73  Resp: 18  Temp: (!) 97.3 F (36.3 C)  SpO2: 99%   Filed Weights   09/25/21 1545  Weight: (!) 302 lb 3.2 oz (137.1 kg)      LABORATORY DATA:  I have reviewed the data as listed CMP Latest Ref Rng & Units 08/11/2021 08/17/2020 02/12/2020  Glucose 70 - 99 mg/dL 100(H) 111(H) 107(H)  BUN 8 - 23 mg/dL 9 - -  Creatinine 0.44 - 1.00  mg/dL 0.80 - -  Sodium 135 - 145 mmol/L 139 - -  Potassium 3.5 - 5.1 mmol/L 3.4(L) - -  Chloride 98 - 111 mmol/L 102 - -  CO2 22 - 32 mmol/L 29 - -  Calcium 8.9 - 10.3 mg/dL 9.6 - -  Total Protein 6.5 - 8.1 g/dL - - -  Total Bilirubin 0.3 - 1.2 mg/dL - - -  Alkaline Phos 38 - 126 U/L - - -  AST 15 - 41 U/L - - -  ALT 14 - 54 U/L - - -    Lab Results  Component Value Date   WBC 3.5 (L) 08/11/2021   HGB 12.4 08/11/2021   HCT 36.5 08/11/2021   MCV 97.1 08/11/2021   PLT 243 08/11/2021   NEUTROABS 1.5 (L) 01/02/2018    ASSESSMENT & PLAN:  Breast cancer of upper-outer quadrant of left female breast ([place]) Left lumpectomy 04/11/2016: IDC grade 3, 1.2 cm, IG DCIS, LVI present, 0/8 lymph nodes negative, ER 100%, PR 90%, HER-2 negative, Ki-67 10%, T1 cN0 stage I a pathologic stage   Adj XRT 06/18/16 to 07/30/16   Treatment Plan: Adj Anti-estrogen therapy with Letrozole 2.5 mg daily started 08/29/2016   Letrozole toxicities: Denies any hot flashes. Occasional lightheadedness    Breast cancer surveillance: 1.  CT chest done 08/12/2021: Multiple bilateral lung nodules new since 2019 differential is metastatic disease versus infection 2. mammogram  scheduled for 10/18/21   Lung nodules: I reviewed the CT scan with the patient showing multiple bilateral lung nodules.  These are concerning for metastatic carcinoma.  She has an appointment to see Dr. Valeta Harms with pulmonary.  We will obtain a PET CT scan prior to that visit to see if these nodules are hypermetabolic.  Patient will need a biopsy of these nodules to figure out the treatment plan. Return to clinic in 1 year for follow-up 1 month for telephone visit to discuss the plan.  No orders of the defined types were placed in this encounter.  The patient has a good understanding of the overall plan. she agrees with it. she will call with any problems that may develop before the next visit here.  Total time spent: 30 mins including face to  face time and time spent for planning, charting and coordination of care  Rulon Eisenmenger, MD, MPH 09/25/2021  I, Thana Ates, am acting as scribe for Dr. Nicholas Lose.  I have reviewed the above documentation for accuracy and completeness, and I agree with the above.

## 2021-09-22 NOTE — Telephone Encounter (Signed)
Patient did call in and make an appointment for 10/06/2021 at 1130am.

## 2021-09-22 NOTE — Telephone Encounter (Signed)
PCCM:  I called and spoke with Sheila Jacobs.  She once again has rescheduled her appointment (X3) the day of her appointment.  I called and spoke to her about her CT imaging results and explained that was very important that she followed up with our office.  She said that she did not have transportation set up to her office appointment today.  Prior to any other appointments that are going to be scheduled for her please ensure that she has transportation set up so we can meet and talk about her CT results.  I explained to her the importance of following up regarding her abnormal CT findings and the fact that she needs to do something about this and take it seriously.  CC: Dr. Hart Robinsons, DO [facility] 09/22/2021 11:29 AM

## 2021-09-24 NOTE — Assessment & Plan Note (Signed)
Left lumpectomy 04/11/2016: IDC grade 3, 1.2 cm, IG DCIS, LVI present, 0/8 lymph nodes negative, ER 100%, PR 90%, HER-2 negative, Ki-67 10%, T1 cN0 stage I a pathologic stage  Adj XRT 06/18/16 to 07/30/16  Treatment Plan: Adj Anti-estrogen therapy with Letrozole 2.5 mg dailystarted 08/29/2016  Letrozole toxicities:Denies any hot flashes. Occasional lightheadedness Difficulty with ambulation and obesity and arthralgias: These are pre-existing before letrozole therapy. Patient assured me that she will lose a lot of weight when she sees me next year because she is walking and staying active.  Breast cancer surveillance: 1.Breast exam: 09/25/20: Benign  2.mammogram scheduled for 10/18/21  Return to clinic in 1 year for follow-up

## 2021-09-25 ENCOUNTER — Inpatient Hospital Stay: Payer: Medicare Other | Attending: Hematology and Oncology | Admitting: Hematology and Oncology

## 2021-09-25 ENCOUNTER — Other Ambulatory Visit: Payer: Self-pay

## 2021-09-25 VITALS — BP 172/71 | HR 73 | Temp 97.3°F | Resp 18 | Ht 62.0 in | Wt 302.2 lb

## 2021-09-25 DIAGNOSIS — Z170 Estrogen receptor positive status [ER+]: Secondary | ICD-10-CM | POA: Diagnosis not present

## 2021-09-25 DIAGNOSIS — Z888 Allergy status to other drugs, medicaments and biological substances status: Secondary | ICD-10-CM | POA: Insufficient documentation

## 2021-09-25 DIAGNOSIS — R911 Solitary pulmonary nodule: Secondary | ICD-10-CM | POA: Diagnosis not present

## 2021-09-25 DIAGNOSIS — C50412 Malignant neoplasm of upper-outer quadrant of left female breast: Secondary | ICD-10-CM

## 2021-09-25 DIAGNOSIS — Z7901 Long term (current) use of anticoagulants: Secondary | ICD-10-CM | POA: Diagnosis not present

## 2021-09-25 DIAGNOSIS — Z79899 Other long term (current) drug therapy: Secondary | ICD-10-CM | POA: Insufficient documentation

## 2021-09-26 ENCOUNTER — Ambulatory Visit (INDEPENDENT_AMBULATORY_CARE_PROVIDER_SITE_OTHER): Payer: Medicare Other | Admitting: Endocrinology

## 2021-09-26 ENCOUNTER — Encounter: Payer: Self-pay | Admitting: Endocrinology

## 2021-09-26 ENCOUNTER — Other Ambulatory Visit: Payer: Self-pay | Admitting: Registered Nurse

## 2021-09-26 ENCOUNTER — Telehealth: Payer: Self-pay | Admitting: Hematology and Oncology

## 2021-09-26 VITALS — BP 130/64 | HR 72 | Ht 62.0 in | Wt 301.0 lb

## 2021-09-26 DIAGNOSIS — E890 Postprocedural hypothyroidism: Secondary | ICD-10-CM | POA: Diagnosis not present

## 2021-09-26 DIAGNOSIS — E2839 Other primary ovarian failure: Secondary | ICD-10-CM

## 2021-09-26 NOTE — Telephone Encounter (Signed)
Scheduled per 1/16 los, message was left with pt

## 2021-09-26 NOTE — Progress Notes (Signed)
Patient ID: Sheila Jacobs, female   DOB: 1951-03-20, 71 y.o.   MRN: [id number]           Reason for Appointment: HYPOTHYROIDISM, follow-up      History of Present Illness:   The patient's thyroid enlargement was first discovered in 2009 probably on her routine physical exam  [facility]   She however does not know when her goiter first started    On her initial evaluation she did have needle biopsy of her large right-sided nodule which was benign  Clinically she was felt to have a colloid goiter, mostly right sided  Because of her local pressure symptoms with choking feeling, coughing she was referred for thyroidectomy which was done in June 2018 This had relieved her local pressure symptoms  RECENT history:  She has been previously on 150 mcg of levothyroxine supplementation for her postsurgical hypothyroidism since about 08/2017 Because of her TSH being 0.22 in 12/2018 her dose was reduced to 137 mcg  Since then she has been on the low 137 mcg levothyroxine dose  She does not complain of feeling unusually tired Her weight has apparently come down  She says after couple of times she felt a thump like sensation in her throat but not recently No difficulty swallowing Only rare discomfort Also no palpitations that are new  She has been regular with taking her generic levothyroxine with water in the morning on empty stomach   TSH is again normal  Wt Readings from Last 3 Encounters:  09/26/21 (!) 301 lb (136.5 kg)  09/25/21 (!) 302 lb 3.2 oz (137.1 kg)  07/07/21 (!) 313 lb 9.6 oz (142.2 kg)    Lab Results  Component Value Date   TSH 0.48 09/21/2021   TSH 1.27 08/17/2020   TSH 1.33 02/12/2020   FREET4 1.42 09/21/2021   FREET4 1.30 08/17/2020   FREET4 1.23 02/12/2020        Allergies as of 09/26/2021       Reactions   Cortisone Rash   Effexor [venlafaxine] Palpitations   Lexapro [escitalopram] Palpitations   Heart  racing   Xarelto [rivaroxaban] Rash        Medication List        Accurate as of September 26, 2021  2:11 PM. If you have any questions, ask your nurse or doctor.          acetaminophen 500 MG tablet Commonly known as: TYLENOL Take 500 mg by mouth every 6 (six) hours as needed.   anastrozole 1 MG tablet Commonly known as: ARIMIDEX TAKE 1 TABLET BY MOUTH EVERY DAY   cholecalciferol 25 MCG (1000 UNIT) tablet Commonly known as: VITAMIN D3 Take 1,000 Units by mouth daily.   Eliquis 5 MG Tabs tablet Generic drug: apixaban TAKE 1 TABLET BY MOUTH TWICE A DAY   gabapentin 100 MG capsule Commonly known as: NEURONTIN Take 100 mg by mouth 3 (three) times daily.   hydrochlorothiazide 25 MG tablet Commonly known as: HYDRODIURIL Take 1 tablet (25 mg total) by mouth daily.   levothyroxine 137 MCG tablet Commonly known as: SYNTHROID TAKE 1 TABLET BY MOUTH DAILY BEFORE BREAKFAST.   metoprolol succinate 50 MG 24 hr tablet Commonly known as: TOPROL-XL Take 50 mg by mouth daily.   omeprazole 20 MG capsule Commonly known as: PRILOSEC Take 20 mg by mouth in the morning and at bedtime.   triamcinolone cream 0.1 % Commonly known as: KENALOG SMARTSIG:Sparingly Topical  Twice Daily        Allergies:  Allergies  Allergen Reactions   Cortisone Rash   Effexor [Venlafaxine] Palpitations   Lexapro [Escitalopram] Palpitations    Heart racing   Xarelto [Rivaroxaban] Rash    Past Medical History:  Diagnosis Date   Acute saddle pulmonary embolism without acute cor pulmonale (HCC) 12/29/2017   Anginal pain (HCC)    Arthritis    Breast cancer of upper-outer quadrant of left female breast ([place]) 01/31/2016   Colon polyps    GERD (gastroesophageal reflux disease)    History of radiation therapy 10/9-11/20/17   Left Breast 45 Gy in 25 fractions, Left Breast Boost 6 Gy in 3 fractions.    Hyperlipidemia    Hypertension    Morbid obesity (HCC)    OSA (obstructive sleep apnea)  01/23/2018   Palpitations 10/16/2016   Personal history of noncompliance with medical treatment, presenting hazards to health 08/27/2016   Personal history of radiation therapy    Thyroid goiter     Past Surgical History:  Procedure Laterality Date   BREAST BIOPSY Left 01/2016   BREAST LUMPECTOMY Left 04/2016   BREAST LUMPECTOMY WITH RADIOACTIVE SEED AND SENTINEL LYMPH NODE BIOPSY Left 04/11/2016   Procedure: BREAST LUMPECTOMY WITH RADIOACTIVE SEED AND SENTINEL LYMPH NODE BIOPSY;  Surgeon: Excell Seltzer, MD;  Location: [facility];  Service: General;  Laterality: Left;   COLONOSCOPY WITH PROPOFOL N/A 10/20/2015   Procedure: COLONOSCOPY WITH PROPOFOL;  Surgeon: Mauri Pole, MD;  Location: [facility];  Service: Endoscopy;  Laterality: N/A;   ESOPHAGOGASTRODUODENOSCOPY (EGD) WITH PROPOFOL N/A 10/20/2015   Procedure: ESOPHAGOGASTRODUODENOSCOPY (EGD) WITH PROPOFOL;  Surgeon: Mauri Pole, MD;  Location: [facility];  Service: Endoscopy;  Laterality: N/A;   IR ANGIOGRAM PULMONARY BILATERAL SELECTIVE  12/29/2017   IR ANGIOGRAM SELECTIVE EACH ADDITIONAL VESSEL  12/29/2017   IR ANGIOGRAM SELECTIVE EACH ADDITIONAL VESSEL  12/29/2017   IR INFUSION THROMBOL ARTERIAL INITIAL (MS)  12/29/2017   IR INFUSION THROMBOL ARTERIAL INITIAL (MS)  12/29/2017   IR THROMB F/U EVAL ART/VEN FINAL DAY (MS)  12/30/2017   IR US GUIDE VASC ACCESS RIGHT  12/29/2017   LEFT HEART CATHETERIZATION WITH CORONARY ANGIOGRAM N/A 10/21/2014   Procedure: LEFT HEART CATHETERIZATION WITH CORONARY ANGIOGRAM;  Surgeon: Burnell Blanks, MD;  Location: [facility];  Service: Cardiovascular;  Laterality: N/A;   THYROIDECTOMY N/A 02/26/2017   Procedure: TOTAL THYROIDECTOMY;  Surgeon: Excell Seltzer, MD;  Location: [facility];  Service: General;  Laterality: N/A;   TUBAL LIGATION      Family History  Problem Relation Age of Onset   Hypertension Mother    Heart disease Mother        Pacemaker   Kidney disease Mother    Cataracts  Mother    Hearing loss Mother    Diabetes Mother    Liver disease Mother    Diabetes Sister    Cataracts Brother    Hearing loss Brother    Colon cancer Neg Hx    Thyroid disease Neg Hx     Social History:  reports that she quit smoking about 7 years ago. Her smoking use included cigarettes. She has a 45.00 pack-year smoking history. She has never used smokeless tobacco. She reports that she does not drink alcohol and does not use drugs.    Review of Systems   Hypertension, treated by PCP with hydrochlorothiazide   BP Readings from Last 3 Encounters:  09/26/21 130/64  09/25/21 (!) 172/71  08/12/21 Marland Kitchen)  121/55   She is being evaluated for pulmonary nodules   Examination:   BP 130/64    Pulse 72    Ht 5\' 2"  (1.575 m)    Wt (!) 301 lb (136.5 kg)    SpO2 98%    BMI 55.05 kg/m   No mass felt in the neck and the thyroid area  Bicep reflexes are normal No tremor   Assessment/Plan:  Postsurgical hypothyroidism  Thyroid levels are consistent with her dose of 137 mcg levothyroxine, last dosage change was in April 2020  Although TSH may be low normal she is not having any symptoms  She will continue the same dose of 137 mcg, although she may benefit from reducing the dose by half tablet weekly it is unlikely she will comply Will check her again in 6 months to make sure her thyroid levels are consistent  Elayne Snare 09/26/2021

## 2021-10-05 ENCOUNTER — Other Ambulatory Visit: Payer: Self-pay

## 2021-10-05 ENCOUNTER — Encounter (HOSPITAL_COMMUNITY)
Admission: RE | Admit: 2021-10-05 | Discharge: 2021-10-05 | Disposition: A | Discharge status: Home / Self Care | Payer: Medicare Other | Source: Ambulatory Visit | Attending: Hematology and Oncology | Admitting: Hematology and Oncology

## 2021-10-05 DIAGNOSIS — R911 Solitary pulmonary nodule: Secondary | ICD-10-CM | POA: Diagnosis present

## 2021-10-05 DIAGNOSIS — Z170 Estrogen receptor positive status [ER+]: Secondary | ICD-10-CM

## 2021-10-05 DIAGNOSIS — C50412 Malignant neoplasm of upper-outer quadrant of left female breast: Secondary | ICD-10-CM | POA: Diagnosis present

## 2021-10-05 LAB — GLUCOSE, CAPILLARY: Glucose-Capillary: 140 mg/dL — ABNORMAL HIGH (ref 70–99)

## 2021-10-05 IMAGING — PT NM PET TUM IMG INITIAL (PI) SKULL BASE T - THIGH
1 of 7 series · 3 of 25 positions shown · non-contrast
Comparison: CTA chest dated [DATE]

CLINICAL DATA: Initial treatment strategy for lung nodules. History
of left breast cancer, status post radiation and chemotherapy.

EXAM:
NUCLEAR MEDICINE PET SKULL BASE TO THIGH
TECHNIQUE: 15.0 mCi F-18 FDG was injected intravenously. Full-ring PET imaging
was performed from the skull base to thigh after the radiotracer. CT
data was obtained and used for attenuation correction and anatomic
localization.
Fasting blood glucose: 140 mg/dl

[Series 4: ct sk_thigh 5.0 bf37 · axial · 5.0mm · 0.98mm/px · z∈[-1230,-810]mm · 3 of 211 slices shown]
[im 53/211  brain]
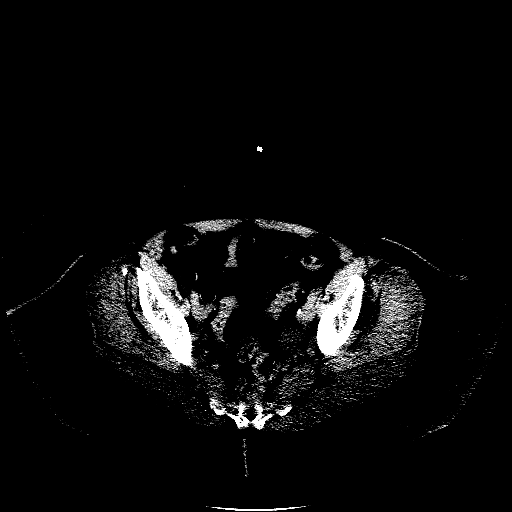
[im 106/211  brain]
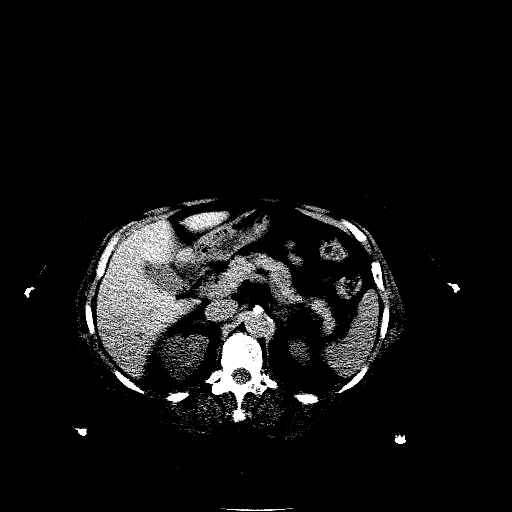
[im 158/211  brain]
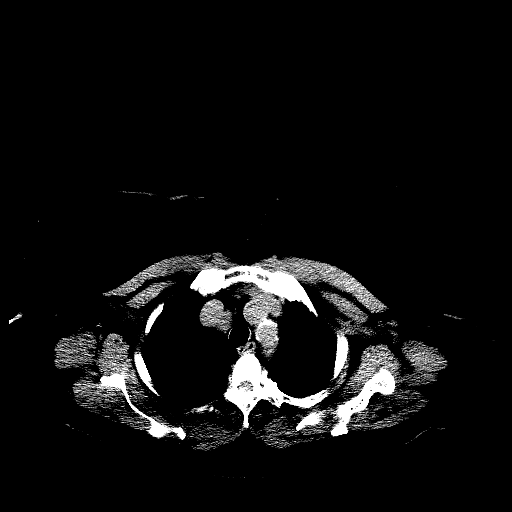

[3 of 25 positions shown; findings below may reference images not displayed]

FINDINGS: Mediastinal blood pool activity: SUV max

Liver activity: SUV max NA

NECK: No hypermetabolic cervical lymphadenopathy.

Incidental CT findings: none

CHEST: Numerous bilateral pulmonary nodules with associated
hypermetabolism, compatible with metastatic disease. Index nodules
include:

--10 mm nodule in the left lung apex (series 8/image 11), max SUV
4.4

--10 mm nodule in the medial right middle lobe (series 8/image 27),
max SUV

--9 mm nodule in the posterior right lower lobe (series 8/image 38),
max SUV

Status post left breast lumpectomy. 10 mm short axis left axillary
node (series 4/image 61), max SUV 10.0, suspicious for nodal
recurrence/metastasis.

Mild mediastinal and right perihilar hypermetabolism, max SUV 6.9 in
the right perihilar region, indeterminate.

Incidental CT findings: none

ABDOMEN/PELVIS: No abnormal hypermetabolism in the liver, spleen,
pancreas, and adrenal glands.

No hypermetabolic abdominopelvic lymphadenopathy.

Incidental CT findings: Small hiatal hernia. Atherosclerotic
calcifications abdominal aorta and branch vessels. Left colonic
diverticulosis, without evidence of diverticulitis. Mildly
thick-walled bladder. Uterine fibroids.

SKELETON: No focal hypermetabolic activity to suggest skeletal
metastasis.

Incidental CT findings: Degenerative changes of the visualized
thoracolumbar spine.
IMPRESSION: Status post left breast lumpectomy. Hypermetabolic left axillary
node, compatible with nodal recurrence/metastasis.

Numerous bilateral pulmonary nodules measuring up to 10 mm,
compatible with metastatic disease.

## 2021-10-05 MED ORDER — FLUDEOXYGLUCOSE F - 18 (FDG) INJECTION
16.0000 | Freq: Once | INTRAVENOUS | Status: AC
  Administered 2021-10-05: 15.02 via INTRAVENOUS

## 2021-10-06 ENCOUNTER — Institutional Professional Consult (permissible substitution): Payer: Medicare Other | Admitting: Pulmonary Disease

## 2021-10-06 NOTE — Progress Notes (Incomplete)
Synopsis: Referred in *** for *** by Nicholas Lose, MD  Subjective:   PATIENT ID: Sheila Jacobs GENDER: female DOB: August 04, 1951, MRN: [id number]  No chief complaint on file.   HPI  ***  Past Medical History:  Diagnosis Date   Acute saddle pulmonary embolism without acute cor pulmonale (HCC) 12/29/2017   Anginal pain (HCC)    Arthritis    Breast cancer of upper-outer quadrant of left female breast ([place]) 01/31/2016   Colon polyps    GERD (gastroesophageal reflux disease)    History of radiation therapy 10/9-11/20/17   Left Breast 45 Gy in 25 fractions, Left Breast Boost 6 Gy in 3 fractions.    Hyperlipidemia    Hypertension    Morbid obesity (HCC)    OSA (obstructive sleep apnea) 01/23/2018   Palpitations 10/16/2016   Personal history of noncompliance with medical treatment, presenting hazards to health 08/27/2016   Personal history of radiation therapy    Thyroid goiter      Family History  Problem Relation Age of Onset   Hypertension Mother    Heart disease Mother        Pacemaker   Kidney disease Mother    Cataracts Mother    Hearing loss Mother    Diabetes Mother    Liver disease Mother    Diabetes Sister    Cataracts Brother    Hearing loss Brother    Colon cancer Neg Hx    Thyroid disease Neg Hx      Past Surgical History:  Procedure Laterality Date   BREAST BIOPSY Left 01/2016   BREAST LUMPECTOMY Left 04/2016   BREAST LUMPECTOMY WITH RADIOACTIVE SEED AND SENTINEL LYMPH NODE BIOPSY Left 04/11/2016   Procedure: BREAST LUMPECTOMY WITH RADIOACTIVE SEED AND SENTINEL LYMPH NODE BIOPSY;  Surgeon: Excell Seltzer, MD;  Location: [facility];  Service: General;  Laterality: Left;   COLONOSCOPY WITH PROPOFOL N/A 10/20/2015   Procedure: COLONOSCOPY WITH PROPOFOL;  Surgeon: Mauri Pole, MD;  Location: [facility];  Service: Endoscopy;  Laterality: N/A;   ESOPHAGOGASTRODUODENOSCOPY (EGD) WITH PROPOFOL N/A 10/20/2015   Procedure:  ESOPHAGOGASTRODUODENOSCOPY (EGD) WITH PROPOFOL;  Surgeon: Mauri Pole, MD;  [facility];  Service: Endoscopy;  Laterality: N/A;   IR ANGIOGRAM PULMONARY BILATERAL SELECTIVE  12/29/2017   IR ANGIOGRAM SELECTIVE EACH ADDITIONAL VESSEL  12/29/2017   IR ANGIOGRAM SELECTIVE EACH ADDITIONAL VESSEL  12/29/2017   IR INFUSION THROMBOL ARTERIAL INITIAL (MS)  12/29/2017   IR INFUSION THROMBOL ARTERIAL INITIAL (MS)  12/29/2017   IR THROMB F/U EVAL ART/VEN FINAL DAY (MS)  12/30/2017   IR US GUIDE VASC ACCESS RIGHT  12/29/2017   LEFT HEART CATHETERIZATION WITH CORONARY ANGIOGRAM N/A 10/21/2014   Procedure: LEFT HEART CATHETERIZATION WITH CORONARY ANGIOGRAM;  Surgeon: Burnell Blanks, MD;  Location: [facility] C[facility];  Service: Cardiovascular;  Laterality: N/A;   THYROIDECTOMY N/A 02/26/2017   Procedure: TOTAL THYROIDECTOMY;  Surgeon: Excell Seltzer, MD;  [facility];  Service: General;  Laterality: N/A;   TUBAL LIGATION      Social History   Socioeconomic History   Marital status: Single    Spouse name: Not on file   Number of children: 1   Years of education: 12   Highest education level: Not on file  Occupational History   Occupation: Retired  Tobacco Use   Smoking status: Former    Packs/day: 1.00    Years: 45.00    Pack years: 45.00    Types: Cigarettes  Quit date: 07/11/2014    Years since quitting: 7.2   Smokeless tobacco: Never  Vaping Use   Vaping Use: Never used  Substance and Sexual Activity   Alcohol use: No    Alcohol/week: 0.0 standard drinks   Drug use: No   Sexual activity: Not on file  Other Topics Concern   Not on file  Social History Narrative   Lives with daughter.    Right-hand.   Occasional use of caffeine.    Social Determinants of Health   Financial Resource Strain: Not on file  Food Insecurity: Not on file  Transportation Needs: Not on file  Physical Activity: Not on file  Stress: Not on file  Social  Connections: Not on file  Intimate Partner Violence: Not on file     Allergies  Allergen Reactions   Cortisone Rash   Effexor [Venlafaxine] Palpitations   Lexapro [Escitalopram] Palpitations    Heart racing   Xarelto [Rivaroxaban] Rash     Outpatient Medications Prior to Visit  Medication Sig Dispense Refill   acetaminophen (TYLENOL) 500 MG tablet Take 500 mg by mouth every 6 (six) hours as needed.     anastrozole (ARIMIDEX) 1 MG tablet TAKE 1 TABLET BY MOUTH EVERY DAY 90 tablet 0   cholecalciferol (VITAMIN D3) 25 MCG (1000 UNIT) tablet Take 1,000 Units by mouth daily.     ELIQUIS 5 MG TABS tablet TAKE 1 TABLET BY MOUTH TWICE A DAY 180 tablet 1   gabapentin (NEURONTIN) 100 MG capsule Take 100 mg by mouth 3 (three) times daily.     hydrochlorothiazide (HYDRODIURIL) 25 MG tablet Take 1 tablet (25 mg total) by mouth daily. 90 tablet 3   levothyroxine (SYNTHROID) 137 MCG tablet TAKE 1 TABLET BY MOUTH DAILY BEFORE BREAKFAST. 90 tablet 2   metoprolol succinate (TOPROL-XL) 50 MG 24 hr tablet Take 50 mg by mouth daily.     omeprazole (PRILOSEC) 20 MG capsule Take 20 mg by mouth in the morning and at bedtime.     triamcinolone cream (KENALOG) 0.1 % SMARTSIG:Sparingly Topical Twice Daily     No facility-administered medications prior to visit.    ROS   Objective:  Physical Exam   There were no vitals filed for this visit.   on *** LPM *** RA BMI Readings from Last 3 Encounters:  09/26/21 55.05 kg/m  09/25/21 55.27 kg/m  07/07/21 57.36 kg/m   Wt Readings from Last 3 Encounters:  09/26/21 (!) 301 lb (136.5 kg)  09/25/21 (!) 302 lb 3.2 oz (137.1 kg)  07/07/21 (!) 313 lb 9.6 oz (142.2 kg)     CBC    Component Value Date/Time   WBC 3.5 (L) 08/11/2021 1640   RBC 3.76 (L) 08/11/2021 1640   HGB 12.4 08/11/2021 1640   HGB 12.4 10/22/2018 1030   HGB 11.2 (L) 08/29/2016 0856   HCT 36.5 08/11/2021 1640   HCT 35.2 10/22/2018 1030   HCT 33.9 (L) 08/29/2016 0856    PLT 243 08/11/2021 1640   PLT 227 10/22/2018 1030   MCV 97.1 08/11/2021 1640   MCV 93 10/22/2018 1030   MCV 95.0 08/29/2016 0856   MCH 33.0 08/11/2021 1640   MCHC 34.0 08/11/2021 1640   RDW 12.4 08/11/2021 1640   RDW 12.2 10/22/2018 1030   RDW 12.6 08/29/2016 0856   LYMPHSABS 1.1 01/02/2018 2057   LYMPHSABS 1.3 08/29/2016 0856   MONOABS 0.4 01/02/2018 2057   MONOABS 0.3 08/29/2016 0856   EOSABS 0.2 01/02/2018 2057  EOSABS 0.1 08/29/2016 0856   BASOSABS 0.0 01/02/2018 2057   BASOSABS 0.0 08/29/2016 0856    ***  Chest Imaging: ***  Pulmonary Functions Testing Results: No flowsheet data found.  FeNO: ***  Pathology: ***  Echocardiogram: ***  Heart Catheterization: ***    Assessment & Plan:   No diagnosis found.  Discussion: ***   Current Outpatient Medications:    acetaminophen (TYLENOL) 500 MG tablet, Take 500 mg by mouth every 6 (six) hours as needed., Disp: , Rfl:    anastrozole (ARIMIDEX) 1 MG tablet, TAKE 1 TABLET BY MOUTH EVERY DAY, Disp: 90 tablet, Rfl: 0   cholecalciferol (VITAMIN D3) 25 MCG (1000 UNIT) tablet, Take 1,000 Units by mouth daily., Disp: , Rfl:    ELIQUIS 5 MG TABS tablet, TAKE 1 TABLET BY MOUTH TWICE A DAY, Disp: 180 tablet, Rfl: 1   gabapentin (NEURONTIN) 100 MG capsule, Take 100 mg by mouth 3 (three) times daily., Disp: , Rfl:    hydrochlorothiazide (HYDRODIURIL) 25 MG tablet, Take 1 tablet (25 mg total) by mouth daily., Disp: 90 tablet, Rfl: 3   levothyroxine (SYNTHROID) 137 MCG tablet, TAKE 1 TABLET BY MOUTH DAILY BEFORE BREAKFAST., Disp: 90 tablet, Rfl: 2   metoprolol succinate (TOPROL-XL) 50 MG 24 hr tablet, Take 50 mg by mouth daily., Disp: , Rfl:    omeprazole (PRILOSEC) 20 MG capsule, Take 20 mg by mouth in the morning and at bedtime., Disp: , Rfl:    triamcinolone cream (KENALOG) 0.1 %, SMARTSIG:Sparingly Topical Twice Daily, Disp: , Rfl:   I spent *** minutes dedicated to the care of this patient on the date of this  encounter to include pre-visit review of records, face-to-face time with the patient discussing conditions above, post visit ordering of testing, clinical documentation with the electronic health record, making appropriate referrals as documented, and communicating necessary findings to members of the patients care team.   Garner Nash, [facility] 10/06/2021 10:20 AM

## 2021-10-18 ENCOUNTER — Ambulatory Visit
Admission: RE | Admit: 2021-10-18 | Discharge: 2021-10-18 | Disposition: A | Discharge status: Home / Self Care | Payer: Commercial Managed Care - HMO | Source: Ambulatory Visit | Attending: Hematology and Oncology | Admitting: Hematology and Oncology

## 2021-10-18 ENCOUNTER — Other Ambulatory Visit: Payer: Self-pay | Admitting: Hematology and Oncology

## 2021-10-18 ENCOUNTER — Other Ambulatory Visit: Payer: Self-pay

## 2021-10-18 DIAGNOSIS — Z9889 Other specified postprocedural states: Secondary | ICD-10-CM

## 2021-10-18 IMAGING — MG MM DIGITAL SCREENING BILAT W/ TOMO AND CAD
8 of 22 series · 8 of 40 positions shown · non-contrast
Comparison: Previous exam(s).

CLINICAL DATA: Screening.

EXAM:
DIGITAL SCREENING BILATERAL MAMMOGRAM WITH TOMOSYNTHESIS AND CAD
TECHNIQUE: Bilateral screening digital craniocaudal and mediolateral oblique
mammograms were obtained. Bilateral screening digital breast
tomosynthesis was performed. The images were evaluated with
computer-aided detection.

[L MLO synth-2D]
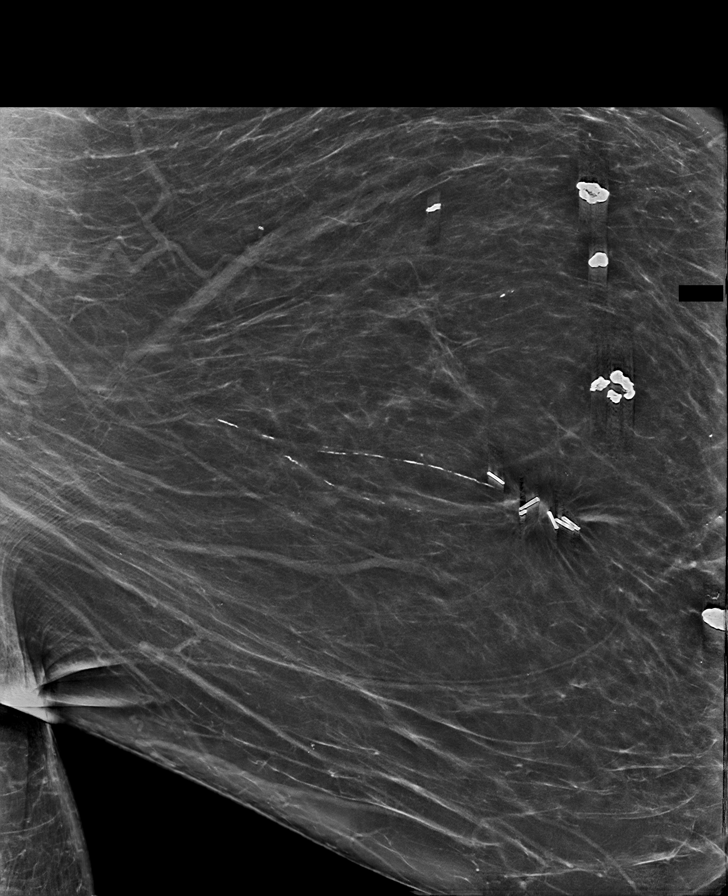

[R CC synth-2D (1 of 2)]
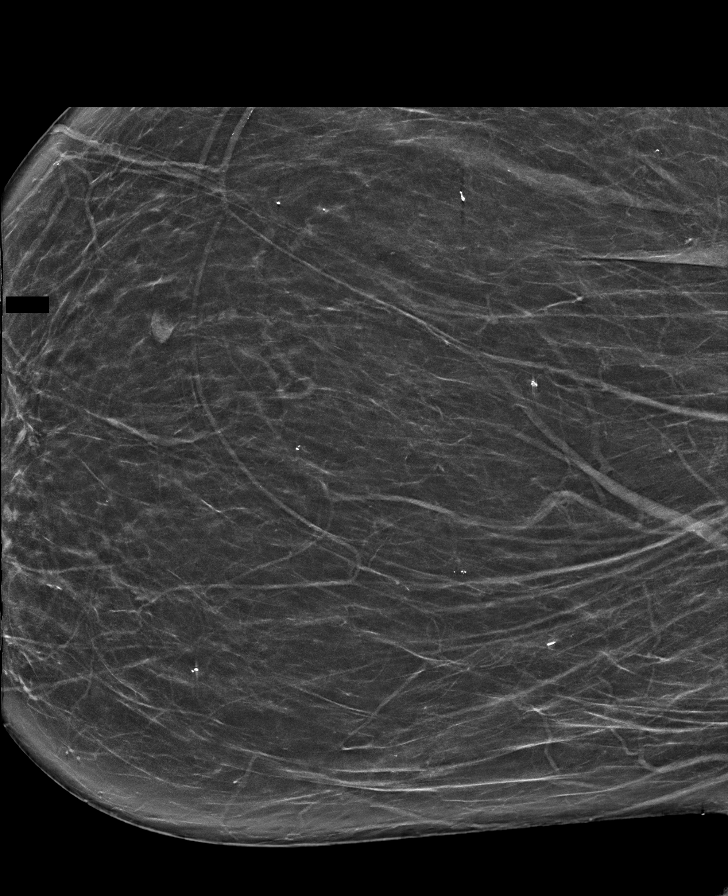

[R CC synth-2D (2 of 2)]
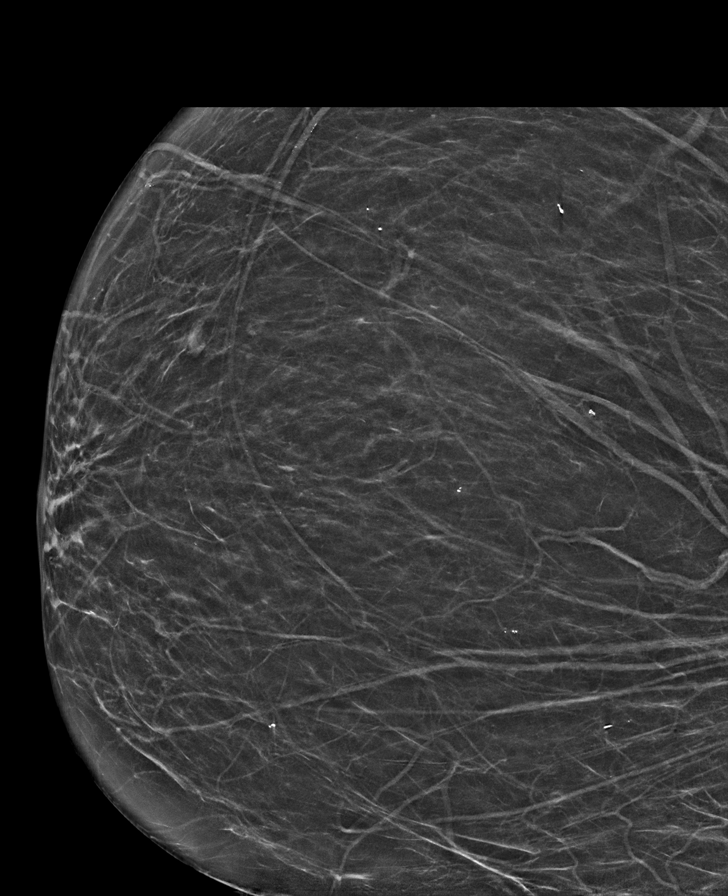

[R MLO synth-2D]
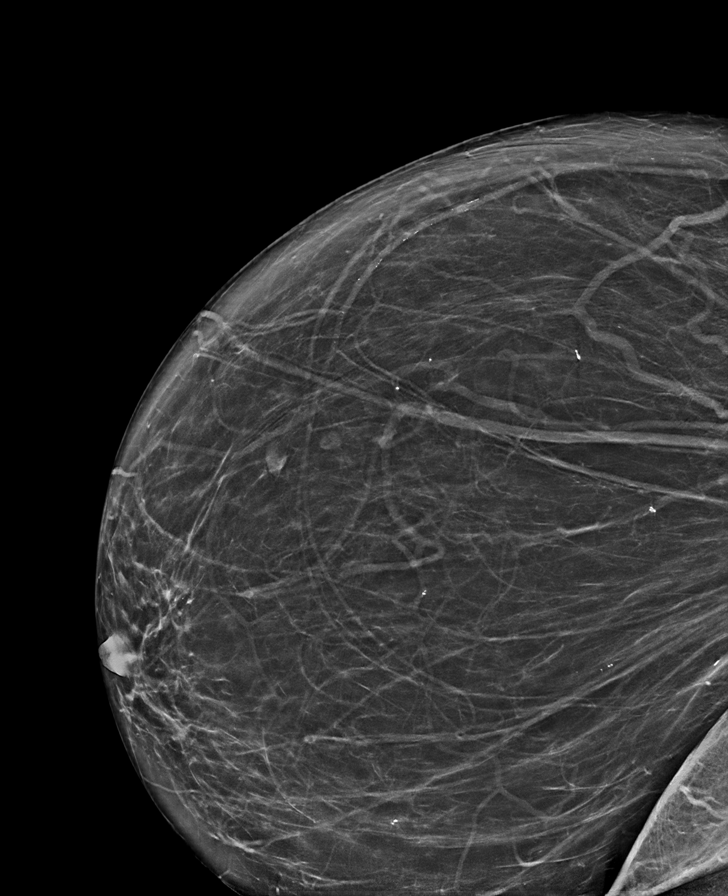

[L CC synth-2D (1 of 3)]
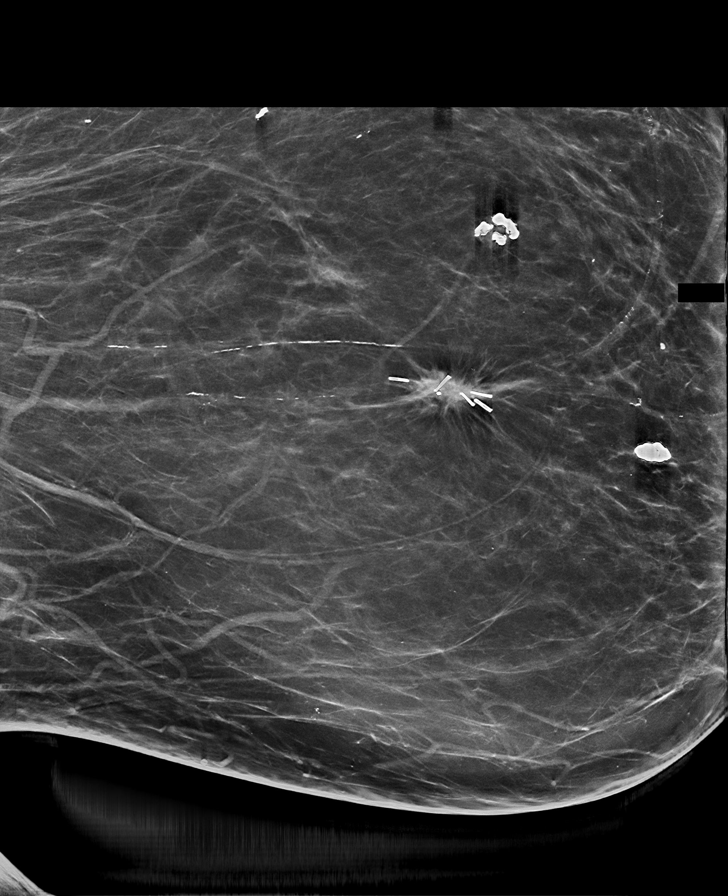

[L CC synth-2D (2 of 3)]
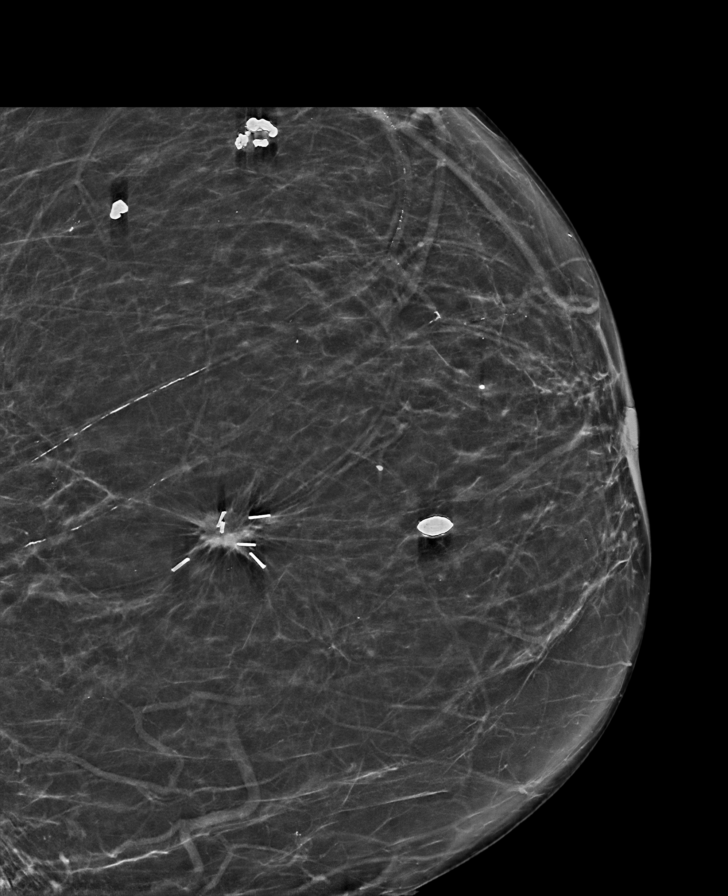

[L CC synth-2D (3 of 3)]
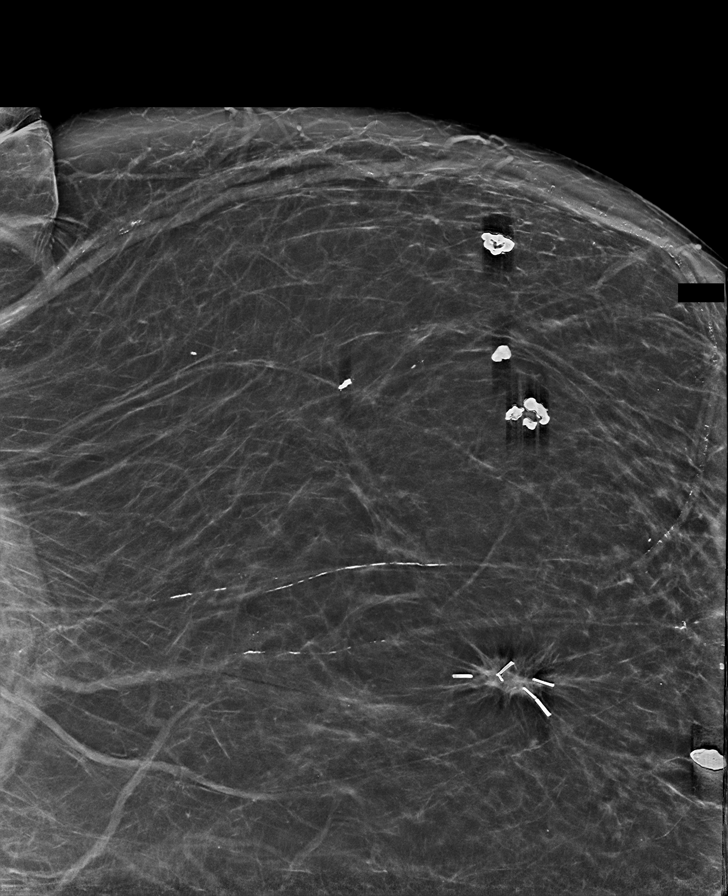

[R MLO tomo · tomo slice 53/78.0]
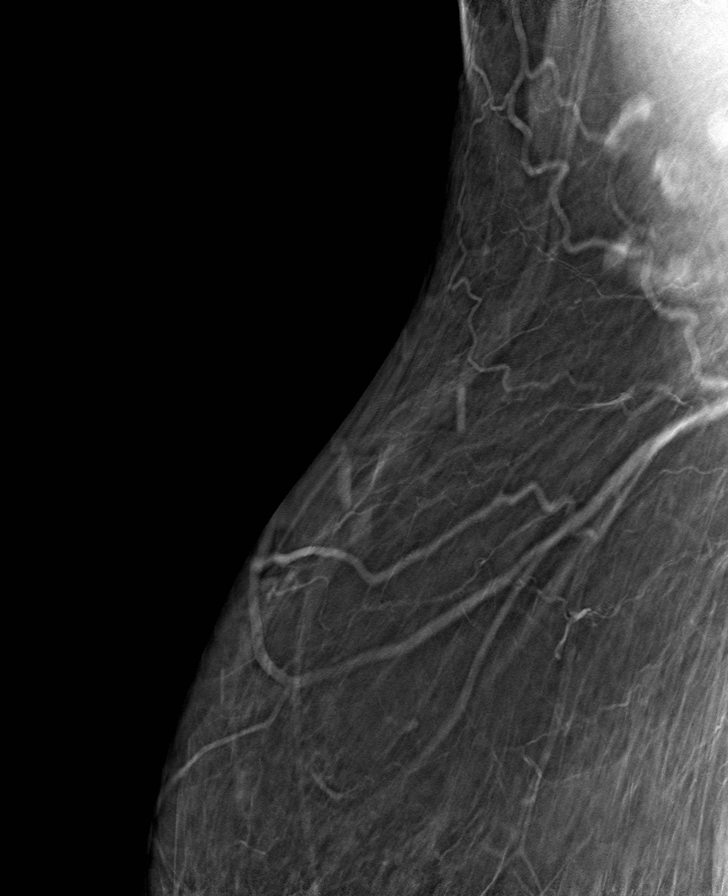

[8 of 40 positions shown; findings below may reference images not displayed]

ACR Breast Density Category b: There are scattered areas of
fibroglandular density.
FINDINGS: There are no findings suspicious for malignancy.
IMPRESSION: No mammographic evidence of malignancy. A result letter of this
screening mammogram will be mailed directly to the patient.

RECOMMENDATION:
Screening mammogram in one year. (Code:[BY])

BI-RADS CATEGORY  1: Negative.

## 2021-10-19 ENCOUNTER — Ambulatory Visit (INDEPENDENT_AMBULATORY_CARE_PROVIDER_SITE_OTHER): Payer: Medicare Other | Admitting: Pulmonary Disease

## 2021-10-19 ENCOUNTER — Encounter: Payer: Self-pay | Admitting: Pulmonary Disease

## 2021-10-19 VITALS — BP 112/60 | HR 78 | Temp 98.1°F | Ht 62.0 in | Wt 300.2 lb

## 2021-10-19 DIAGNOSIS — Z87891 Personal history of nicotine dependence: Secondary | ICD-10-CM | POA: Diagnosis not present

## 2021-10-19 DIAGNOSIS — Z853 Personal history of malignant neoplasm of breast: Secondary | ICD-10-CM | POA: Diagnosis not present

## 2021-10-19 DIAGNOSIS — R918 Other nonspecific abnormal finding of lung field: Secondary | ICD-10-CM | POA: Diagnosis present

## 2021-10-19 NOTE — Patient Instructions (Addendum)
Thank you for visiting Dr. Valeta Harms at [facility]. Today we recommend the following:  Orders Placed This Encounter  Procedures   Procedural/ Surgical Case Request: ROBOTIC ASSISTED NAVIGATIONAL BRONCHOSCOPY   CT Super D Chest Wo Contrast   Ambulatory referral to Pulmonology   Bronchoscopy on 11/07/2021 Last dose ELIQUIS ON 11/04/2021  Return in about 26 days (around 11/14/2021). W/ Eric Form, NP     Please do your part to reduce the spread of COVID-19.

## 2021-10-19 NOTE — Progress Notes (Signed)
Synopsis: Referred in February 2023 for lung nodules by Arthur Holms, NP  Subjective:   PATIENT ID: Sheila Jacobs GENDER: female DOB: 07-07-1951, MRN: [id number]  Chief Complaint  Patient presents with   Consult    Consult.     This is a 71 year old female, past medical history of breast cancer, history of radiation therapy to the left breast followed by the cancer center.  Patient has also history of hypertension and OSA, morbid obesity.  As well as pulmonary embolism.  She is currently on Eliquis.The CT chest that was complete on 08/12/2021 which revealed multiple bilateral pulmonary nodules concerning for metastatic disease.  Patient was referred to see [place] in January however failed to follow-up with her appointments had several really scheduled appointments.  Ultimately saw medical oncology before seeing me and had a nuclear medicine PET scan completed.  The nuclear medicine PET scan was completed that showed multiple pulmonary nodules measuring 10 mm in size concerning for metastatic disease of the chest.  Patient had a office visit with Dr. Lindi Adie from medical oncology on 09/25/2021.  Office note reviewed.  Patient here today to discuss video bronchoscopy with tissue sampling for evaluation of lung nodules.   Past Medical History:  Diagnosis Date   Acute saddle pulmonary embolism without acute cor pulmonale (HCC) 12/29/2017   Anginal pain (HCC)    Arthritis    Breast cancer of upper-outer quadrant of left female breast ([place]) 01/31/2016   Colon polyps    GERD (gastroesophageal reflux disease)    History of radiation therapy 10/9-11/20/17   Left Breast 45 Gy in 25 fractions, Left Breast Boost 6 Gy in 3 fractions.    Hyperlipidemia    Hypertension    Morbid obesity (HCC)    OSA (obstructive sleep apnea) 01/23/2018   Palpitations 10/16/2016   Personal history of noncompliance with medical treatment, presenting hazards to health 08/27/2016   Personal history of radiation therapy     Thyroid goiter      Family History  Problem Relation Age of Onset   Hypertension Mother    Heart disease Mother        Pacemaker   Kidney disease Mother    Cataracts Mother    Hearing loss Mother    Diabetes Mother    Liver disease Mother    Diabetes Sister    Cataracts Brother    Hearing loss Brother    Colon cancer Neg Hx    Thyroid disease Neg Hx      Past Surgical History:  Procedure Laterality Date   BREAST BIOPSY Left 01/2016   BREAST LUMPECTOMY Left 04/2016   BREAST LUMPECTOMY WITH RADIOACTIVE SEED AND SENTINEL LYMPH NODE BIOPSY Left 04/11/2016   Procedure: BREAST LUMPECTOMY WITH RADIOACTIVE SEED AND SENTINEL LYMPH NODE BIOPSY;  Surgeon: Excell Seltzer, MD;  Location: [facility];  Service: General;  Laterality: Left;   COLONOSCOPY WITH PROPOFOL N/A 10/20/2015   Procedure: COLONOSCOPY WITH PROPOFOL;  Surgeon: Mauri Pole, MD;  [facility];  Service: Endoscopy;  Laterality: N/A;   ESOPHAGOGASTRODUODENOSCOPY (EGD) WITH PROPOFOL N/A 10/20/2015   Procedure: ESOPHAGOGASTRODUODENOSCOPY (EGD) WITH PROPOFOL;  Surgeon: Mauri Pole, MD;  [facility];  Service: Endoscopy;  Laterality: N/A;   IR ANGIOGRAM PULMONARY BILATERAL SELECTIVE  12/29/2017   IR ANGIOGRAM SELECTIVE EACH ADDITIONAL VESSEL  12/29/2017   IR ANGIOGRAM SELECTIVE EACH ADDITIONAL VESSEL  12/29/2017   IR INFUSION THROMBOL ARTERIAL INITIAL (MS)  12/29/2017   IR INFUSION THROMBOL ARTERIAL INITIAL (MS)  12/29/2017   IR THROMB F/U EVAL ART/VEN FINAL DAY (MS)  12/30/2017   IR US GUIDE VASC ACCESS RIGHT  12/29/2017   LEFT HEART CATHETERIZATION WITH CORONARY ANGIOGRAM N/A 10/21/2014   Procedure: LEFT HEART CATHETERIZATION WITH CORONARY ANGIOGRAM;  Surgeon: Burnell Blanks, MD;  [facility]ATH [facility];  Service: Cardiovascular;  Laterality: N/A;   THYROIDECTOMY N/A 02/26/2017   Procedure: TOTAL THYROIDECTOMY;  Surgeon: Excell Seltzer, MD;  [facility];  Service: General;  Laterality: N/A;    TUBAL LIGATION      Social History   Socioeconomic History   Marital status: Single    Spouse name: Not on file   Number of children: 1   Years of education: 12   Highest education level: Not on file  Occupational History   Occupation: Retired  Tobacco Use   Smoking status: Former    Packs/day: 1.00    Years: 45.00    Pack years: 45.00    Types: Cigarettes    Quit date: 07/11/2014    Years since quitting: 7.2   Smokeless tobacco: Never  Vaping Use   Vaping Use: Never used  Substance and Sexual Activity   Alcohol use: No    Alcohol/week: 0.0 standard drinks   Drug use: No   Sexual activity: Not on file  Other Topics Concern   Not on file  Social History Narrative   Lives with daughter.    Right-hand.   Occasional use of caffeine.    Social Determinants of Health   Financial Resource Strain: Not on file  Food Insecurity: Not on file  Transportation Needs: Not on file  Physical Activity: Not on file  Stress: Not on file  Social Connections: Not on file  Intimate Partner Violence: Not on file     Allergies  Allergen Reactions   Cortisone Rash   Effexor [Venlafaxine] Palpitations   Lexapro [Escitalopram] Palpitations    Heart racing   Xarelto [Rivaroxaban] Rash     Outpatient Medications Prior to Visit  Medication Sig Dispense Refill   acetaminophen (TYLENOL) 500 MG tablet Take 500 mg by mouth every 6 (six) hours as needed.     anastrozole (ARIMIDEX) 1 MG tablet TAKE 1 TABLET BY MOUTH EVERY DAY 90 tablet 0   cholecalciferol (VITAMIN D3) 25 MCG (1000 UNIT) tablet Take 1,000 Units by mouth daily.     ELIQUIS 5 MG TABS tablet TAKE 1 TABLET BY MOUTH TWICE A DAY 180 tablet 1   gabapentin (NEURONTIN) 100 MG capsule Take 100 mg by mouth 3 (three) times daily.     hydrochlorothiazide (HYDRODIURIL) 25 MG tablet Take 1 tablet (25 mg total) by mouth daily. 90 tablet 3   levothyroxine (SYNTHROID) 137 MCG tablet TAKE 1 TABLET BY MOUTH DAILY BEFORE BREAKFAST. 90 tablet 2    metoprolol succinate (TOPROL-XL) 50 MG 24 hr tablet Take 50 mg by mouth daily.     omeprazole (PRILOSEC) 20 MG capsule Take 20 mg by mouth in the morning and at bedtime.     triamcinolone cream (KENALOG) 0.1 % SMARTSIG:Sparingly Topical Twice Daily     No facility-administered medications prior to visit.    Review of Systems  Constitutional:  Negative for chills, fever, malaise/fatigue and weight loss.  HENT:  Negative for hearing loss, sore throat and tinnitus.   Eyes:  Negative for blurred vision and double vision.  Respiratory:  Positive for shortness of breath. Negative for cough, hemoptysis, sputum production, wheezing and stridor.   Cardiovascular:  Negative for  chest pain, palpitations, orthopnea, leg swelling and PND.  Gastrointestinal:  Negative for abdominal pain, constipation, diarrhea, heartburn, nausea and vomiting.  Genitourinary:  Negative for dysuria, hematuria and urgency.  Musculoskeletal:  Negative for joint pain and myalgias.  Skin:  Negative for itching and rash.  Neurological:  Negative for dizziness, tingling, weakness and headaches.  Endo/Heme/Allergies:  Negative for environmental allergies. Does not bruise/bleed easily.  Psychiatric/Behavioral:  Negative for depression. The patient is not nervous/anxious and does not have insomnia.   All other systems reviewed and are negative.   Objective:  Physical Exam Vitals reviewed.  Constitutional:      General: She is not in acute distress.    Appearance: She is well-developed. She is obese.  HENT:     Head: Normocephalic and atraumatic.  Eyes:     General: No scleral icterus.    Conjunctiva/sclera: Conjunctivae normal.     Pupils: Pupils are equal, round, and reactive to light.  Neck:     Vascular: No JVD.     Trachea: No tracheal deviation.  Cardiovascular:     Rate and Rhythm: Normal rate and regular rhythm.     Heart sounds: Normal heart sounds. No murmur heard. Pulmonary:     Effort: Pulmonary effort  is normal. No tachypnea, accessory muscle usage or respiratory distress.     Breath sounds: No stridor. No wheezing, rhonchi or rales.     Comments: Diminished breath sounds bilaterally Abdominal:     General: There is no distension.     Palpations: Abdomen is soft.     Tenderness: There is no abdominal tenderness.  Musculoskeletal:        General: No tenderness.     Cervical back: Neck supple.  Lymphadenopathy:     Cervical: No cervical adenopathy.  Skin:    General: Skin is warm and dry.     Capillary Refill: Capillary refill takes less than 2 seconds.     Findings: No rash.  Neurological:     Mental Status: She is alert and oriented to person, place, and time.  Psychiatric:        Behavior: Behavior normal.     Vitals:   10/19/21 1111  BP: 112/60  Pulse: 78  Temp: 98.1 F (36.7 C)  TempSrc: Oral  SpO2: 99%  Weight: (!) 300 lb 3.2 oz (136.2 kg)  Height: 5\' 2"  (1.575 m)   99% on RA BMI Readings from Last 3 Encounters:  10/19/21 54.91 kg/m  09/26/21 55.05 kg/m  09/25/21 55.27 kg/m   Wt Readings from Last 3 Encounters:  10/19/21 (!) 300 lb 3.2 oz (136.2 kg)  09/26/21 (!) 301 lb (136.5 kg)  09/25/21 (!) 302 lb 3.2 oz (137.1 kg)     CBC    Component Value Date/Time   WBC 3.5 (L) 08/11/2021 1640   RBC 3.76 (L) 08/11/2021 1640   HGB 12.4 08/11/2021 1640   HGB 12.4 10/22/2018 1030   HGB 11.2 (L) 08/29/2016 0856   HCT 36.5 08/11/2021 1640   HCT 35.2 10/22/2018 1030   HCT 33.9 (L) 08/29/2016 0856   PLT 243 08/11/2021 1640   PLT 227 10/22/2018 1030   MCV 97.1 08/11/2021 1640   MCV 93 10/22/2018 1030   MCV 95.0 08/29/2016 0856   MCH 33.0 08/11/2021 1640   MCHC 34.0 08/11/2021 1640   RDW 12.4 08/11/2021 1640   RDW 12.2 10/22/2018 1030   RDW 12.6 08/29/2016 0856   LYMPHSABS 1.1 01/02/2018 2057   LYMPHSABS 1.3 08/29/2016 0856  MONOABS 0.4 01/02/2018 2057   MONOABS 0.3 08/29/2016 0856   EOSABS 0.2 01/02/2018 2057   EOSABS 0.1 08/29/2016 0856   BASOSABS  0.0 01/02/2018 2057   BASOSABS 0.0 08/29/2016 0856     Chest Imaging: 10/05/2021 nuclear medicine PET imaging: Patient with multiple hypermetabolic pulmonary lesions concerning for metastatic disease to the chest. The patient's images have been independently reviewed by me.    Pulmonary Functions Testing Results: No flowsheet data found.  FeNO:   Pathology:   Echocardiogram:   Heart Catheterization:     Assessment & Plan:     ICD-10-CM   1. Multiple pulmonary nodules  R91.8 Procedural/ Surgical Case Request: ROBOTIC ASSISTED NAVIGATIONAL BRONCHOSCOPY    Ambulatory referral to Pulmonology    CT Super D Chest Wo Contrast    2. History of breast cancer  Z85.3 Procedural/ Surgical Case Request: ROBOTIC ASSISTED NAVIGATIONAL BRONCHOSCOPY    Ambulatory referral to Pulmonology    CT Super D Chest Wo Contrast    3. Former heavy cigarette smoker (20-39 per day)  Z87.891       Discussion:  This is a 71 year old female, history of breast cancer now with abnormal CT imaging of the chest concerning for metastatic disease with multiple pulmonary nodules.  She is on Eliquis with a history of PE several years ago.  Plan: We discussed today the risk benefits and alternatives of proceeding with robotic assisted navigational bronchoscopy for tissue sampling of the pulmonary nodules. Patient is agreeable to proceed. We talk about the risk of bleeding and pneumothorax. She will also have to come off of her Eliquis.  We have set a date for 11/07/2021. She will need to stop her Eliquis last dose on 11/04/2021. I have called and spoke with her granddaughter in the office. We will need to help coordinate her transport to the hospital with her granddaughter. She will need a super D CT completed prior to this  Orders have been placed for case, CT scan.  Return to clinic to see [place] 1 week after.    Current Outpatient Medications:    acetaminophen (TYLENOL) 500 MG tablet, Take 500 mg by  mouth every 6 (six) hours as needed., Disp: , Rfl:    anastrozole (ARIMIDEX) 1 MG tablet, TAKE 1 TABLET BY MOUTH EVERY DAY, Disp: 90 tablet, Rfl: 0   cholecalciferol (VITAMIN D3) 25 MCG (1000 UNIT) tablet, Take 1,000 Units by mouth daily., Disp: , Rfl:    ELIQUIS 5 MG TABS tablet, TAKE 1 TABLET BY MOUTH TWICE A DAY, Disp: 180 tablet, Rfl: 1   gabapentin (NEURONTIN) 100 MG capsule, Take 100 mg by mouth 3 (three) times daily., Disp: , Rfl:    hydrochlorothiazide (HYDRODIURIL) 25 MG tablet, Take 1 tablet (25 mg total) by mouth daily., Disp: 90 tablet, Rfl: 3   levothyroxine (SYNTHROID) 137 MCG tablet, TAKE 1 TABLET BY MOUTH DAILY BEFORE BREAKFAST., Disp: 90 tablet, Rfl: 2   metoprolol succinate (TOPROL-XL) 50 MG 24 hr tablet, Take 50 mg by mouth daily., Disp: , Rfl:    omeprazole (PRILOSEC) 20 MG capsule, Take 20 mg by mouth in the morning and at bedtime., Disp: , Rfl:    triamcinolone cream (KENALOG) 0.1 %, SMARTSIG:Sparingly Topical Twice Daily, Disp: , Rfl:   I spent 63 minutes dedicated to the care of this patient on the date of this encounter to include pre-visit review of records, face-to-face time with the patient discussing conditions above, post visit ordering of testing, clinical documentation with the electronic health  record, making appropriate referrals as documented, and communicating necessary findings to members of the patients care team.    Garner Nash, DO [facility] 10/19/2021 11:12 AM

## 2021-10-19 NOTE — H&P (View-Only) (Signed)
Synopsis: Referred in February 2023 for lung nodules by Arthur Holms, NP  Subjective:   PATIENT ID: Sheila Jacobs GENDER: female DOB: Dec 10, 1950, MRN: [id number]  Chief Complaint  Patient presents with   Consult    Consult.     This is a 71 year old female, past medical history of breast cancer, history of radiation therapy to the left breast followed by the cancer center.  Patient has also history of hypertension and OSA, morbid obesity.  As well as pulmonary embolism.  She is currently on Eliquis.The CT chest that was complete on 08/12/2021 which revealed multiple bilateral pulmonary nodules concerning for metastatic disease.  Patient was referred to see [place] in January however failed to follow-up with her appointments had several really scheduled appointments.  Ultimately saw medical oncology before seeing me and had a nuclear medicine PET scan completed.  The nuclear medicine PET scan was completed that showed multiple pulmonary nodules measuring 10 mm in size concerning for metastatic disease of the chest.  Patient had a office visit with Dr. Lindi Adie from medical oncology on 09/25/2021.  Office note reviewed.  Patient here today to discuss video bronchoscopy with tissue sampling for evaluation of lung nodules.   Past Medical History:  Diagnosis Date   Acute saddle pulmonary embolism without acute cor pulmonale (HCC) 12/29/2017   Anginal pain (HCC)    Arthritis    Breast cancer of upper-outer quadrant of left female breast ([place]) 01/31/2016   Colon polyps    GERD (gastroesophageal reflux disease)    History of radiation therapy 10/9-11/20/17   Left Breast 45 Gy in 25 fractions, Left Breast Boost 6 Gy in 3 fractions.    Hyperlipidemia    Hypertension    Morbid obesity (HCC)    OSA (obstructive sleep apnea) 01/23/2018   Palpitations 10/16/2016   Personal history of noncompliance with medical treatment, presenting hazards to health 08/27/2016   Personal history of radiation therapy     Thyroid goiter      Family History  Problem Relation Age of Onset   Hypertension Mother    Heart disease Mother        Pacemaker   Kidney disease Mother    Cataracts Mother    Hearing loss Mother    Diabetes Mother    Liver disease Mother    Diabetes Sister    Cataracts Brother    Hearing loss Brother    Colon cancer Neg Hx    Thyroid disease Neg Hx      Past Surgical History:  Procedure Laterality Date   BREAST BIOPSY Left 01/2016   BREAST LUMPECTOMY Left 04/2016   BREAST LUMPECTOMY WITH RADIOACTIVE SEED AND SENTINEL LYMPH NODE BIOPSY Left 04/11/2016   Procedure: BREAST LUMPECTOMY WITH RADIOACTIVE SEED AND SENTINEL LYMPH NODE BIOPSY;  Surgeon: Excell Seltzer, MD;  Location: [facility];  Service: General;  Laterality: Left;   COLONOSCOPY WITH PROPOFOL N/A 10/20/2015   Procedure: COLONOSCOPY WITH PROPOFOL;  Surgeon: Mauri Pole, MD;  Location: [facility];  Service: Endoscopy;  Laterality: N/A;   ESOPHAGOGASTRODUODENOSCOPY (EGD) WITH PROPOFOL N/A 10/20/2015   Procedure: ESOPHAGOGASTRODUODENOSCOPY (EGD) WITH PROPOFOL;  Surgeon: Mauri Pole, MD;  Location: [facility];  Service: Endoscopy;  Laterality: N/A;   IR ANGIOGRAM PULMONARY BILATERAL SELECTIVE  12/29/2017   IR ANGIOGRAM SELECTIVE EACH ADDITIONAL VESSEL  12/29/2017   IR ANGIOGRAM SELECTIVE EACH ADDITIONAL VESSEL  12/29/2017   IR INFUSION THROMBOL ARTERIAL INITIAL (MS)  12/29/2017   IR INFUSION THROMBOL ARTERIAL INITIAL (MS)  12/29/2017   IR THROMB F/U EVAL ART/VEN FINAL DAY (MS)  12/30/2017   IR US GUIDE VASC ACCESS RIGHT  12/29/2017   LEFT HEART CATHETERIZATION WITH CORONARY ANGIOGRAM N/A 10/21/2014   Procedure: LEFT HEART CATHETERIZATION WITH CORONARY ANGIOGRAM;  Surgeon: Burnell Blanks, MD;  Location: [facility] C[facility];  Service: Cardiovascular;  Laterality: N/A;   THYROIDECTOMY N/A 02/26/2017   Procedure: TOTAL THYROIDECTOMY;  Surgeon: Excell Seltzer, MD;  Location: [facility];  Service: General;  Laterality: N/A;    TUBAL LIGATION      Social History   Socioeconomic History   Marital status: Single    Spouse name: Not on file   Number of children: 1   Years of education: 12   Highest education level: Not on file  Occupational History   Occupation: Retired  Tobacco Use   Smoking status: Former    Packs/day: 1.00    Years: 45.00    Pack years: 45.00    Types: Cigarettes    Quit date: 07/11/2014    Years since quitting: 7.2   Smokeless tobacco: Never  Vaping Use   Vaping Use: Never used  Substance and Sexual Activity   Alcohol use: No    Alcohol/week: 0.0 standard drinks   Drug use: No   Sexual activity: Not on file  Other Topics Concern   Not on file  Social History Narrative   Lives with daughter.    Right-hand.   Occasional use of caffeine.    Social Determinants of Health   Financial Resource Strain: Not on file  Food Insecurity: Not on file  Transportation Needs: Not on file  Physical Activity: Not on file  Stress: Not on file  Social Connections: Not on file  Intimate Partner Violence: Not on file     Allergies  Allergen Reactions   Cortisone Rash   Effexor [Venlafaxine] Palpitations   Lexapro [Escitalopram] Palpitations    Heart racing   Xarelto [Rivaroxaban] Rash     Outpatient Medications Prior to Visit  Medication Sig Dispense Refill   acetaminophen (TYLENOL) 500 MG tablet Take 500 mg by mouth every 6 (six) hours as needed.     anastrozole (ARIMIDEX) 1 MG tablet TAKE 1 TABLET BY MOUTH EVERY DAY 90 tablet 0   cholecalciferol (VITAMIN D3) 25 MCG (1000 UNIT) tablet Take 1,000 Units by mouth daily.     ELIQUIS 5 MG TABS tablet TAKE 1 TABLET BY MOUTH TWICE A DAY 180 tablet 1   gabapentin (NEURONTIN) 100 MG capsule Take 100 mg by mouth 3 (three) times daily.     hydrochlorothiazide (HYDRODIURIL) 25 MG tablet Take 1 tablet (25 mg total) by mouth daily. 90 tablet 3   levothyroxine (SYNTHROID) 137 MCG tablet TAKE 1 TABLET BY MOUTH DAILY BEFORE BREAKFAST. 90 tablet 2    metoprolol succinate (TOPROL-XL) 50 MG 24 hr tablet Take 50 mg by mouth daily.     omeprazole (PRILOSEC) 20 MG capsule Take 20 mg by mouth in the morning and at bedtime.     triamcinolone cream (KENALOG) 0.1 % SMARTSIG:Sparingly Topical Twice Daily     No facility-administered medications prior to visit.    Review of Systems  Constitutional:  Negative for chills, fever, malaise/fatigue and weight loss.  HENT:  Negative for hearing loss, sore throat and tinnitus.   Eyes:  Negative for blurred vision and double vision.  Respiratory:  Positive for shortness of breath. Negative for cough, hemoptysis, sputum production, wheezing and stridor.   Cardiovascular:  Negative for  chest pain, palpitations, orthopnea, leg swelling and PND.  Gastrointestinal:  Negative for abdominal pain, constipation, diarrhea, heartburn, nausea and vomiting.  Genitourinary:  Negative for dysuria, hematuria and urgency.  Musculoskeletal:  Negative for joint pain and myalgias.  Skin:  Negative for itching and rash.  Neurological:  Negative for dizziness, tingling, weakness and headaches.  Endo/Heme/Allergies:  Negative for environmental allergies. Does not bruise/bleed easily.  Psychiatric/Behavioral:  Negative for depression. The patient is not nervous/anxious and does not have insomnia.   All other systems reviewed and are negative.   Objective:  Physical Exam Vitals reviewed.  Constitutional:      General: She is not in acute distress.    Appearance: She is well-developed. She is obese.  HENT:     Head: Normocephalic and atraumatic.  Eyes:     General: No scleral icterus.    Conjunctiva/sclera: Conjunctivae normal.     Pupils: Pupils are equal, round, and reactive to light.  Neck:     Vascular: No JVD.     Trachea: No tracheal deviation.  Cardiovascular:     Rate and Rhythm: Normal rate and regular rhythm.     Heart sounds: Normal heart sounds. No murmur heard. Pulmonary:     Effort: Pulmonary effort  is normal. No tachypnea, accessory muscle usage or respiratory distress.     Breath sounds: No stridor. No wheezing, rhonchi or rales.     Comments: Diminished breath sounds bilaterally Abdominal:     General: There is no distension.     Palpations: Abdomen is soft.     Tenderness: There is no abdominal tenderness.  Musculoskeletal:        General: No tenderness.     Cervical back: Neck supple.  Lymphadenopathy:     Cervical: No cervical adenopathy.  Skin:    General: Skin is warm and dry.     Capillary Refill: Capillary refill takes less than 2 seconds.     Findings: No rash.  Neurological:     Mental Status: She is alert and oriented to person, place, and time.  Psychiatric:        Behavior: Behavior normal.     Vitals:   10/19/21 1111  BP: 112/60  Pulse: 78  Temp: 98.1 F (36.7 C)  TempSrc: Oral  SpO2: 99%  Weight: (!) 300 lb 3.2 oz (136.2 kg)  Height: 5\' 2"  (1.575 m)   99% on RA BMI Readings from Last 3 Encounters:  10/19/21 54.91 kg/m  09/26/21 55.05 kg/m  09/25/21 55.27 kg/m   Wt Readings from Last 3 Encounters:  10/19/21 (!) 300 lb 3.2 oz (136.2 kg)  09/26/21 (!) 301 lb (136.5 kg)  09/25/21 (!) 302 lb 3.2 oz (137.1 kg)     CBC    Component Value Date/Time   WBC 3.5 (L) 08/11/2021 1640   RBC 3.76 (L) 08/11/2021 1640   HGB 12.4 08/11/2021 1640   HGB 12.4 10/22/2018 1030   HGB 11.2 (L) 08/29/2016 0856   HCT 36.5 08/11/2021 1640   HCT 35.2 10/22/2018 1030   HCT 33.9 (L) 08/29/2016 0856   PLT 243 08/11/2021 1640   PLT 227 10/22/2018 1030   MCV 97.1 08/11/2021 1640   MCV 93 10/22/2018 1030   MCV 95.0 08/29/2016 0856   MCH 33.0 08/11/2021 1640   MCHC 34.0 08/11/2021 1640   RDW 12.4 08/11/2021 1640   RDW 12.2 10/22/2018 1030   RDW 12.6 08/29/2016 0856   LYMPHSABS 1.1 01/02/2018 2057   LYMPHSABS 1.3 08/29/2016 0856  MONOABS 0.4 01/02/2018 2057   MONOABS 0.3 08/29/2016 0856   EOSABS 0.2 01/02/2018 2057   EOSABS 0.1 08/29/2016 0856   BASOSABS  0.0 01/02/2018 2057   BASOSABS 0.0 08/29/2016 0856     Chest Imaging: 10/05/2021 nuclear medicine PET imaging: Patient with multiple hypermetabolic pulmonary lesions concerning for metastatic disease to the chest. The patient's images have been independently reviewed by me.    Pulmonary Functions Testing Results: No flowsheet data found.  FeNO:   Pathology:   Echocardiogram:   Heart Catheterization:     Assessment & Plan:     ICD-10-CM   1. Multiple pulmonary nodules  R91.8 Procedural/ Surgical Case Request: ROBOTIC ASSISTED NAVIGATIONAL BRONCHOSCOPY    Ambulatory referral to Pulmonology    CT Super D Chest Wo Contrast    2. History of breast cancer  Z85.3 Procedural/ Surgical Case Request: ROBOTIC ASSISTED NAVIGATIONAL BRONCHOSCOPY    Ambulatory referral to Pulmonology    CT Super D Chest Wo Contrast    3. Former heavy cigarette smoker (20-39 per day)  Z87.891       Discussion:  This is a 71 year old female, history of breast cancer now with abnormal CT imaging of the chest concerning for metastatic disease with multiple pulmonary nodules.  She is on Eliquis with a history of PE several years ago.  Plan: We discussed today the risk benefits and alternatives of proceeding with robotic assisted navigational bronchoscopy for tissue sampling of the pulmonary nodules. Patient is agreeable to proceed. We talk about the risk of bleeding and pneumothorax. She will also have to come off of her Eliquis.  We have set a date for 11/07/2021. She will need to stop her Eliquis last dose on 11/04/2021. I have called and spoke with her granddaughter in the office. We will need to help coordinate her transport to the hospital with her granddaughter. She will need a super D CT completed prior to this  Orders have been placed for case, CT scan.  Return to clinic to see [place] 1 week after.    Current Outpatient Medications:    acetaminophen (TYLENOL) 500 MG tablet, Take 500 mg by  mouth every 6 (six) hours as needed., Disp: , Rfl:    anastrozole (ARIMIDEX) 1 MG tablet, TAKE 1 TABLET BY MOUTH EVERY DAY, Disp: 90 tablet, Rfl: 0   cholecalciferol (VITAMIN D3) 25 MCG (1000 UNIT) tablet, Take 1,000 Units by mouth daily., Disp: , Rfl:    ELIQUIS 5 MG TABS tablet, TAKE 1 TABLET BY MOUTH TWICE A DAY, Disp: 180 tablet, Rfl: 1   gabapentin (NEURONTIN) 100 MG capsule, Take 100 mg by mouth 3 (three) times daily., Disp: , Rfl:    hydrochlorothiazide (HYDRODIURIL) 25 MG tablet, Take 1 tablet (25 mg total) by mouth daily., Disp: 90 tablet, Rfl: 3   levothyroxine (SYNTHROID) 137 MCG tablet, TAKE 1 TABLET BY MOUTH DAILY BEFORE BREAKFAST., Disp: 90 tablet, Rfl: 2   metoprolol succinate (TOPROL-XL) 50 MG 24 hr tablet, Take 50 mg by mouth daily., Disp: , Rfl:    omeprazole (PRILOSEC) 20 MG capsule, Take 20 mg by mouth in the morning and at bedtime., Disp: , Rfl:    triamcinolone cream (KENALOG) 0.1 %, SMARTSIG:Sparingly Topical Twice Daily, Disp: , Rfl:   I spent 63 minutes dedicated to the care of this patient on the date of this encounter to include pre-visit review of records, face-to-face time with the patient discussing conditions above, post visit ordering of testing, clinical documentation with the electronic health  record, making appropriate referrals as documented, and communicating necessary findings to members of the patients care team.    Garner Nash, DO [facility] 10/19/2021 11:12 AM

## 2021-10-23 NOTE — Progress Notes (Signed)
Called several times and left messages.  She did not respond to my phone calls. This encounter was created in error - please disregard.

## 2021-10-24 ENCOUNTER — Inpatient Hospital Stay: Payer: Medicare Other | Admitting: Hematology and Oncology

## 2021-10-24 NOTE — Assessment & Plan Note (Signed)
Left lumpectomy 04/11/2016: IDC grade 3, 1.2 cm, IG DCIS, LVI present, 0/8 lymph nodes negative, ER 100%, PR 90%, HER-2 negative, Ki-67 10%, T1 cN0 stage I a pathologic stage  Adj XRT 06/18/16 to 07/30/16  Treatment Plan: Adj Anti-estrogen therapy with Letrozole 2.5 mg dailystarted 08/29/2016  Letrozole toxicities:Denies any hot flashes. Occasional lightheadedness   Breast cancer surveillance: 1. CT chest done 08/12/2021: Multiple bilateral lung nodules new since 2019 differential is metastatic disease versus infection 2.mammogramscheduled for 10/18/21  Lung nodules: Numerous bilateral pulmonary nodules with hypermetabolism (10 mm, 10 mm, 9 mm, SUV ranged from 3-8.1,) left axillary lymph node SUV 10 suspicious for recurrence/metastasis.  No hypermetabolism in the liver spleen pancreas or adrenals or bone.  Dr.Icard plans to do bronchoscopy on 11/07/2021. We will also arrange for axillary lymph node biopsy under ultrasound guidance  Return to clinic afterwards to discuss results.

## 2021-10-31 ENCOUNTER — Other Ambulatory Visit: Payer: Self-pay

## 2021-10-31 ENCOUNTER — Ambulatory Visit (HOSPITAL_COMMUNITY)
Admission: RE | Admit: 2021-10-31 | Discharge: 2021-10-31 | Disposition: A | Discharge status: Home / Self Care | Payer: Medicare Other | Source: Ambulatory Visit | Attending: Pulmonary Disease | Admitting: Pulmonary Disease

## 2021-10-31 ENCOUNTER — Encounter (HOSPITAL_COMMUNITY): Payer: Self-pay

## 2021-10-31 DIAGNOSIS — R918 Other nonspecific abnormal finding of lung field: Secondary | ICD-10-CM | POA: Diagnosis not present

## 2021-10-31 DIAGNOSIS — Z853 Personal history of malignant neoplasm of breast: Secondary | ICD-10-CM | POA: Diagnosis present

## 2021-10-31 IMAGING — CT CT CHEST SUPER D W/O CM
3 of 4 series · 15 of 36 positions shown, 17 images · non-contrast
Comparison: [DATE].

CLINICAL DATA: Lung nodules. Mid chest pain, shortness of breath on
exertion. History of left breast cancer treated with radiation
therapy, ongoing oral chemotherapy.

EXAM:
CT CHEST WITHOUT CONTRAST
TECHNIQUE: Multidetector CT imaging of the chest was performed using thin slice
collimation for electromagnetic bronchoscopy planning purposes,
without intravenous contrast.
RADIATION DOSE REDUCTION: This exam was performed according to the
departmental dose-optimization program which includes automated
exposure control, adjustment of the mA and/or kV according to
patient size and/or use of iterative reconstruction technique.

[Series 2: thorax · axial · 0.80mm/px · z∈[-298,-88]mm · 7 of 57 slices shown, 9 images]
[im 8/57  mediastinal]
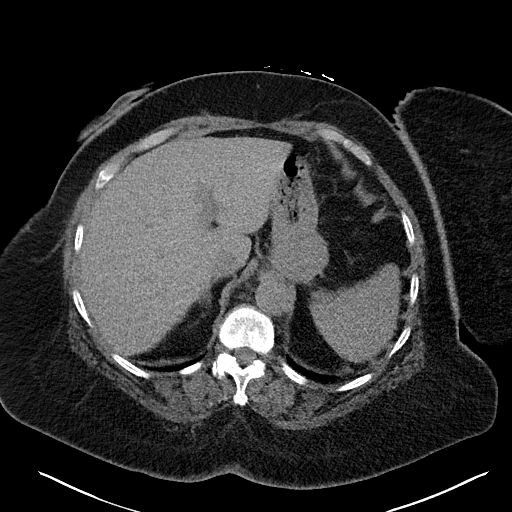
[im 8/57  lung]
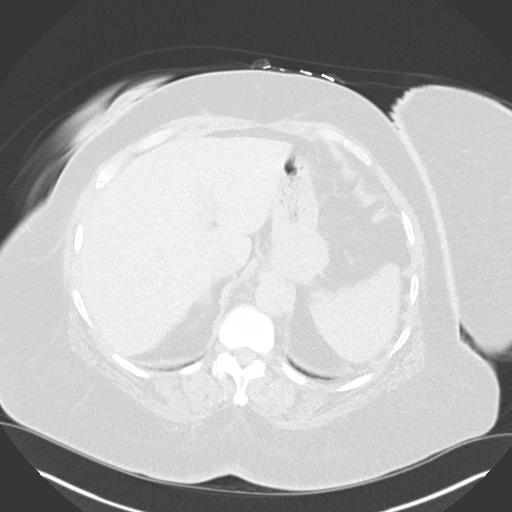
[im 15/57  lung]
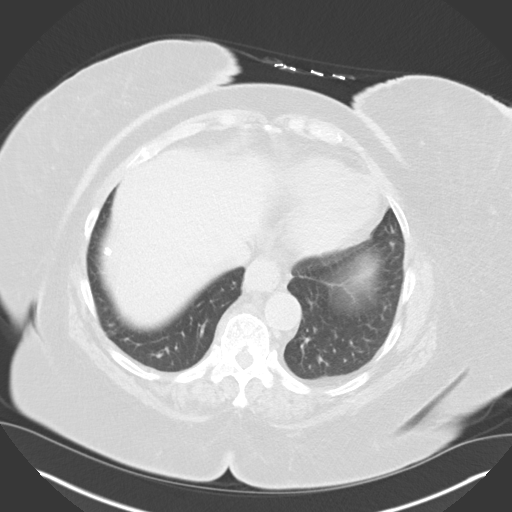
[im 22/57  lung]
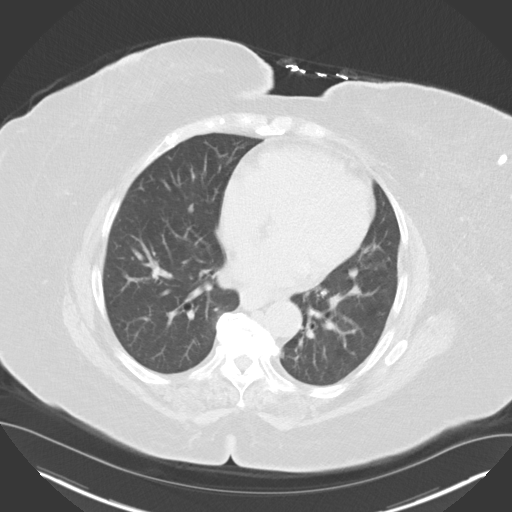
[im 29/57  lung]
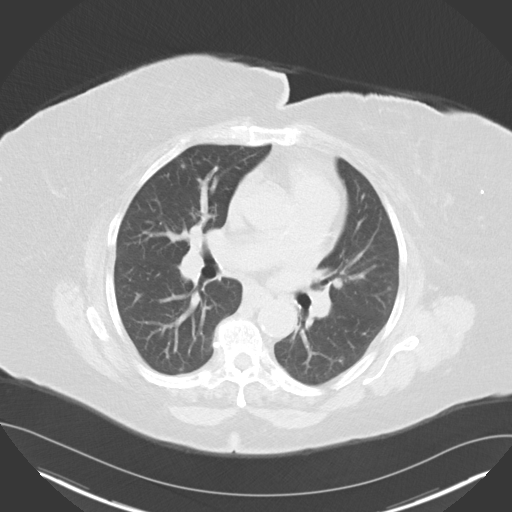
[im 36/57  mediastinal]
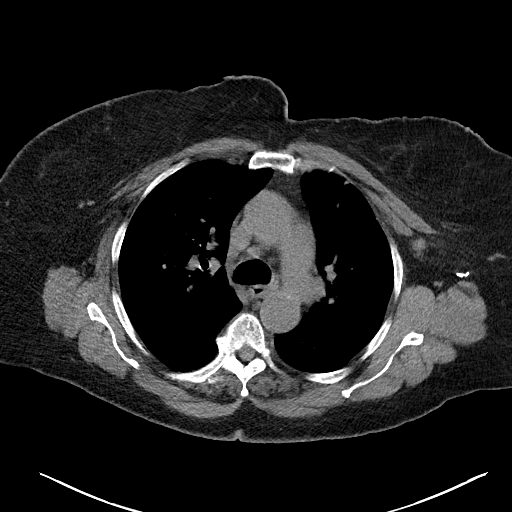
[im 36/57  lung]
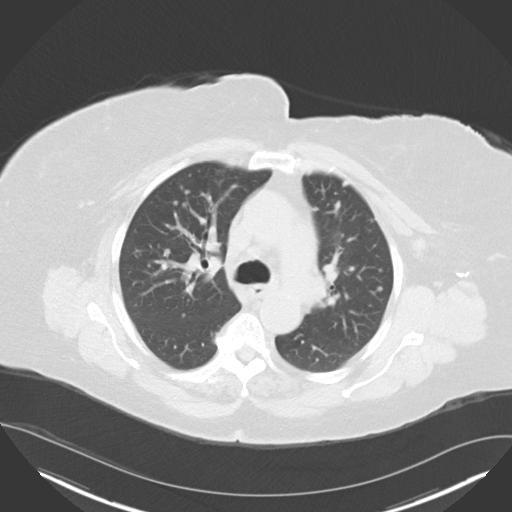
[im 43/57  lung]
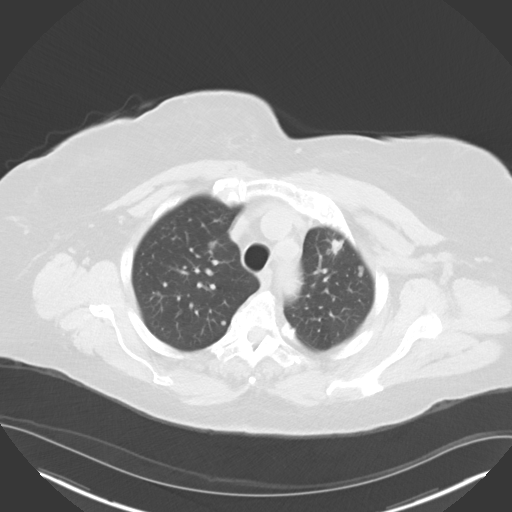
[im 50/57  lung]
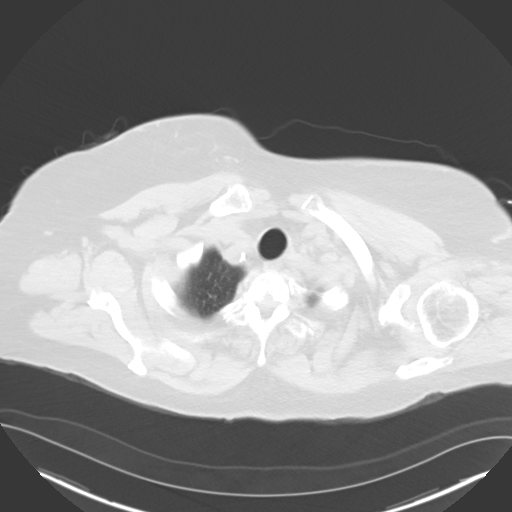

[Series 4: coronal · coronal · 0.59mm/px · 3 of 137 slices shown]
[im 28/137  lung]
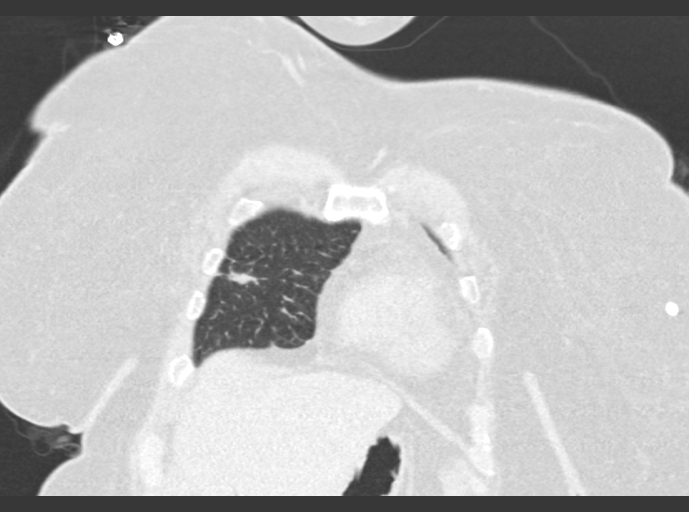
[im 55/137  lung]
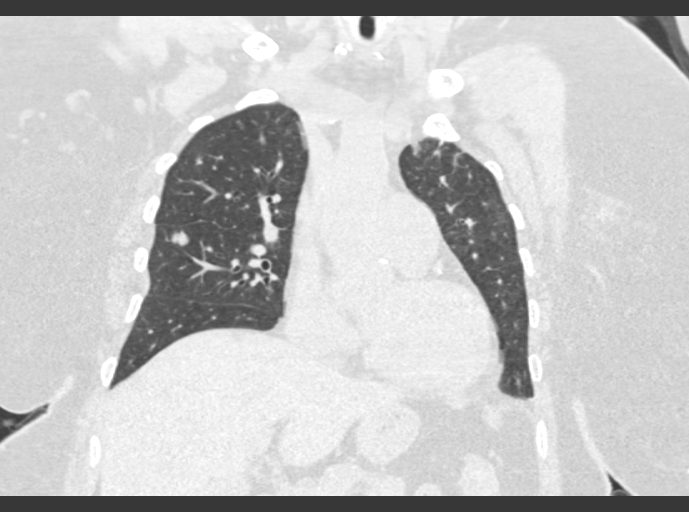
[im 82/137  lung]
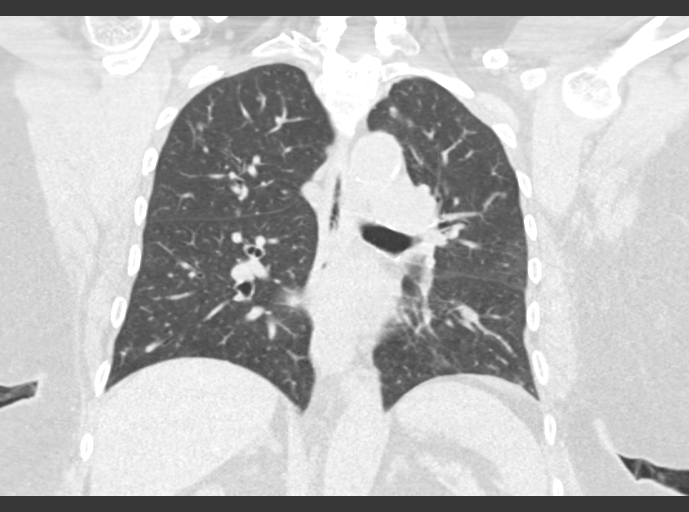

[Series 7: lungs · axial · 0.80mm/px · z∈[-279,-167]mm · 5 of 128 slices shown]
[im 15/128  lung]
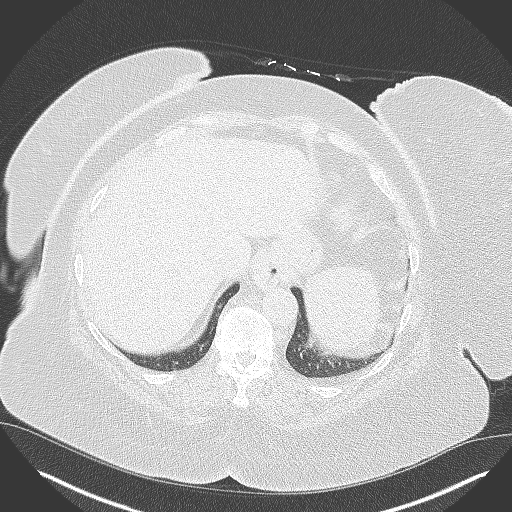
[im 29/128  lung]
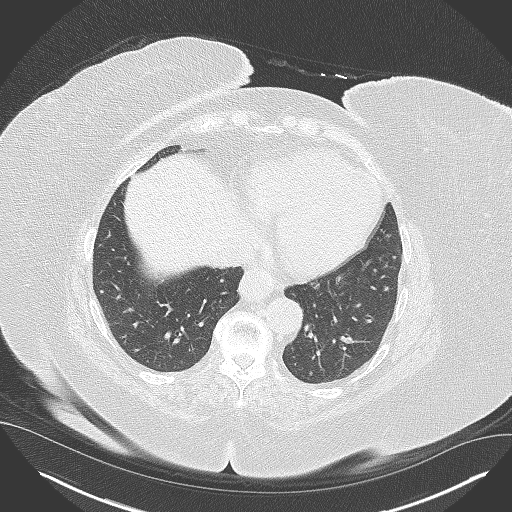
[im 43/128  lung]
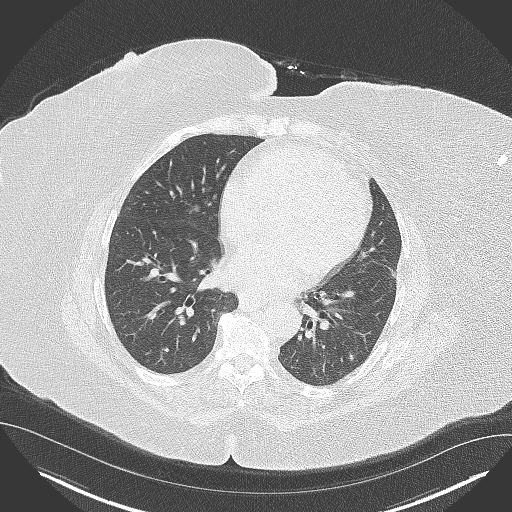
[im 57/128  lung]
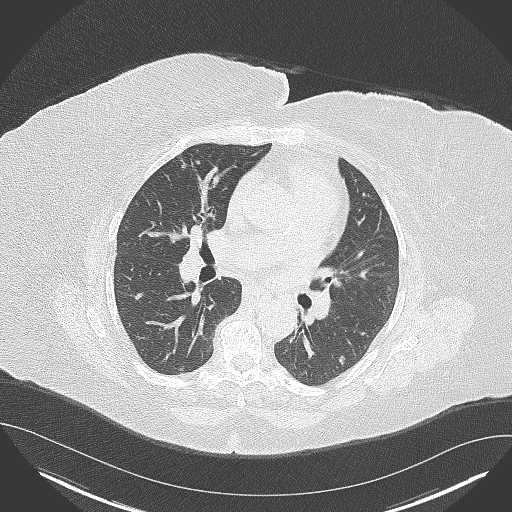
[im 71/128  lung]
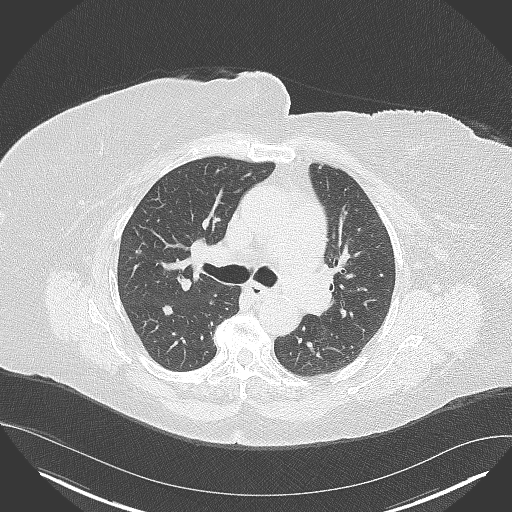

[15 of 36 positions shown; findings below may reference images not displayed]

FINDINGS: Cardiovascular: Atherosclerotic calcification of the aorta and
coronary arteries. Pulmonic trunk and heart are enlarged. No
pericardial effusion.

Mediastinum/Nodes: Thyroidectomy. No pathologically enlarged
mediastinal lymph nodes. Hilar regions are difficult to definitively
evaluate without IV contrast. Left axillary lymph nodes measure up
to 11 mm, previously 15 mm on [DATE]. Distal esophageal wall
thickening can be seen with gastroesophageal reflux.

Lungs/Pleura: Multiple bilateral pulmonary nodules are seen in a
hematogenous distribution, with some being smaller in size. Index 7
mm nodule in the posterior segment right upper lobe (7/56),
previously 12 mm on [DATE]. No pleural fluid. Airway is
unremarkable.

Upper Abdomen: Subcentimeter low-attenuation lesion in the dome of
the liver, too small to characterize. Visualized portions of the
liver and gallbladder are otherwise unremarkable. Low-attenuation
adrenal nodules measure 2.0 cm on the right and 1.7 cm on the left.
Subcentimeter low-attenuation lesion in the upper pole right kidney,
too small to characterize. Visualized portions of the kidneys,
spleen, pancreas, stomach and bowel are unremarkable with exception
of a small hiatal hernia.

Musculoskeletal: Sclerotic lesion in the right lateral aspect of the
T12 vertebral body is new and measures 2.6 x 3.8 cm (2/54). New
sclerosis in the lateral aspect the left fifth rib (2/33).
IMPRESSION: 1. Slight interval improvement in left axillary adenopathy and
pulmonary metastases.
2. New T12 and left fifth rib metastases.
3. Bilateral adrenal adenomas.
4. Aortic atherosclerosis ([1U]-[1U]). Coronary artery
calcification.
5. Enlarged pulmonic trunk, indicative of pulmonary arterial
hypertension.

* onc *

## 2021-11-03 ENCOUNTER — Other Ambulatory Visit: Payer: Self-pay | Admitting: Pulmonary Disease

## 2021-11-03 LAB — SARS CORONAVIRUS 2 (TAT 6-24 HRS): SARS Coronavirus 2: NEGATIVE

## 2021-11-06 ENCOUNTER — Encounter (HOSPITAL_COMMUNITY): Payer: Self-pay | Admitting: Pulmonary Disease

## 2021-11-06 NOTE — Progress Notes (Signed)
Spoke with pt for pre-op call. Pt has hx of PE and takes Eliquis. In Dr. Juline Patch note he told pt to hold Eliquis, last dose to be 11/04/21. Pt states she was told that, but the paper his office gave her said not to hold any medications, so she hasn't stopped it. I told her that I would call her back once I hear from Dr. Valeta Harms if he wants her to hold it tonight and in the AM.   Pt is not diabetic.   Covid test done 11/03/21 and it's negative.

## 2021-11-07 DIAGNOSIS — R918 Other nonspecific abnormal finding of lung field: Secondary | ICD-10-CM | POA: Insufficient documentation

## 2021-11-07 DIAGNOSIS — Z853 Personal history of malignant neoplasm of breast: Secondary | ICD-10-CM

## 2021-11-09 ENCOUNTER — Telehealth: Payer: Self-pay | Admitting: Cardiology

## 2021-11-09 NOTE — Telephone Encounter (Signed)
Patient with diagnosis of PE on Eliquis for anticoagulation.   ? ?Procedure: ROBOTIC ASSISTED NAVIGATIONAL BRONCHOSCOPY ?Date of procedure: 11/14/21 ? ?Hx of Acute saddle PE in 2019. ? ?CrCl 84 ml/min ? ?Per office protocol, patient can hold Eliquis for 2 days prior to procedure.   ? ? ?

## 2021-11-09 NOTE — Telephone Encounter (Signed)
Patient called stating she is having a biopsy done on 3/7 of her lung, she wants to know when she should stop taking her Eliquis.  ?

## 2021-11-09 NOTE — Telephone Encounter (Signed)
Spoke with patient of Dr. [name] who reports she has a lung biopsy on 11/14/21 with Dr. Valeta Harms scheduled. She is unsure if she needs to hold Eliquis, for how long. No clearance request has been received or is in Epic for review. Advised will route to Dr. [name] & Dr. Valeta Harms, along with clinical pharmacy team for review of this request.  ?

## 2021-11-09 NOTE — Telephone Encounter (Signed)
Spoke with pt, aware of the recommendations. Aware to ask the folks doing the procedure when she can restart. ?

## 2021-11-13 ENCOUNTER — Encounter (HOSPITAL_COMMUNITY): Payer: Self-pay | Admitting: Pulmonary Disease

## 2021-11-13 ENCOUNTER — Other Ambulatory Visit: Payer: Self-pay

## 2021-11-13 NOTE — Progress Notes (Signed)
DUE TO COVID-19 ONLY ONE VISITOR IS ALLOWED TO COME WITH YOU AND STAY IN THE WAITING ROOM ONLY DURING PRE OP AND PROCEDURE DAY OF SURGERY.  ? ?PCP - Patricia[facility] Cl[facility], [place]  ?Internal Med - Dr Elayne Snare ?Cardiologist - Dr Minus Breeding ?Oncology - Dr Nicholas Lose ? ?CT Chest x-ray - 10/31/21 ?EKG - 08/11/21 ?Stress Test - 10/13/14 ?ECHO - 05/08/18 ?Cardiac Cath - 10/21/14 ? ?ICD Pacemaker/Loop - n/a ? ?Sleep Study -  Yes ?CPAP - uses CPAP nightly ? ?Blood Thinner Instructions:  Per MD's instructions, patient to hold Eliquis 2 days prior to procedure.  Last dose was on Saturday, 11/11/21. ? ?Anesthesia review: Yes ? ?STOP now taking any Aspirin (unless otherwise instructed by your surgeon), Aleve, Naproxen, Ibuprofen, Motrin, Advil, Goody's, BC's, all herbal medications, fish oil, and all vitamins.  ? ?Coronavirus Screening ?Covid test is scheduled on DOS. ?Do you have any of the following symptoms:  ?Cough yes/no: No ?Fever (>100.7F)  yes/no: No ?Runny nose yes/no: No ?Sore throat yes/no: No ?Difficulty breathing/shortness of breath  yes/no: No ? ?Have you traveled in the last 14 days and where? yes/no: No ? ?Patient verbalized understanding of instructions that were given via phone. ?

## 2021-11-14 ENCOUNTER — Encounter (HOSPITAL_COMMUNITY)
Admission: RE | Disposition: A | Discharge status: Home / Self Care | Payer: Self-pay | Source: Home / Self Care | Attending: Pulmonary Disease

## 2021-11-14 ENCOUNTER — Ambulatory Visit (HOSPITAL_BASED_OUTPATIENT_CLINIC_OR_DEPARTMENT_OTHER): Payer: Medicare Other | Admitting: Physician Assistant

## 2021-11-14 ENCOUNTER — Ambulatory Visit (HOSPITAL_COMMUNITY): Payer: Medicare Other

## 2021-11-14 ENCOUNTER — Encounter (HOSPITAL_COMMUNITY): Payer: Self-pay | Admitting: Pulmonary Disease

## 2021-11-14 ENCOUNTER — Other Ambulatory Visit: Payer: Self-pay

## 2021-11-14 ENCOUNTER — Ambulatory Visit (HOSPITAL_COMMUNITY): Payer: Medicare Other | Admitting: Physician Assistant

## 2021-11-14 ENCOUNTER — Ambulatory Visit (HOSPITAL_COMMUNITY)
Admission: RE | Admit: 2021-11-14 | Discharge: 2021-11-14 | Disposition: A | Discharge status: Home / Self Care | Payer: Medicare Other | Attending: Pulmonary Disease | Admitting: Pulmonary Disease

## 2021-11-14 DIAGNOSIS — Z86718 Personal history of other venous thrombosis and embolism: Secondary | ICD-10-CM | POA: Insufficient documentation

## 2021-11-14 DIAGNOSIS — I10 Essential (primary) hypertension: Secondary | ICD-10-CM

## 2021-11-14 DIAGNOSIS — K219 Gastro-esophageal reflux disease without esophagitis: Secondary | ICD-10-CM | POA: Diagnosis not present

## 2021-11-14 DIAGNOSIS — Z87891 Personal history of nicotine dependence: Secondary | ICD-10-CM | POA: Insufficient documentation

## 2021-11-14 DIAGNOSIS — R918 Other nonspecific abnormal finding of lung field: Secondary | ICD-10-CM | POA: Diagnosis present

## 2021-11-14 DIAGNOSIS — Z419 Encounter for procedure for purposes other than remedying health state, unspecified: Secondary | ICD-10-CM

## 2021-11-14 DIAGNOSIS — G4730 Sleep apnea, unspecified: Secondary | ICD-10-CM | POA: Diagnosis not present

## 2021-11-14 DIAGNOSIS — Z853 Personal history of malignant neoplasm of breast: Secondary | ICD-10-CM

## 2021-11-14 DIAGNOSIS — R846 Abnormal cytological findings in specimens from respiratory organs and thorax: Secondary | ICD-10-CM | POA: Diagnosis not present

## 2021-11-14 DIAGNOSIS — I2699 Other pulmonary embolism without acute cor pulmonale: Secondary | ICD-10-CM | POA: Diagnosis not present

## 2021-11-14 DIAGNOSIS — G4733 Obstructive sleep apnea (adult) (pediatric): Secondary | ICD-10-CM | POA: Diagnosis not present

## 2021-11-14 DIAGNOSIS — E039 Hypothyroidism, unspecified: Secondary | ICD-10-CM | POA: Insufficient documentation

## 2021-11-14 DIAGNOSIS — Z7901 Long term (current) use of anticoagulants: Secondary | ICD-10-CM | POA: Diagnosis not present

## 2021-11-14 DIAGNOSIS — Z20822 Contact with and (suspected) exposure to covid-19: Secondary | ICD-10-CM | POA: Insufficient documentation

## 2021-11-14 DIAGNOSIS — Z6841 Body Mass Index (BMI) 40.0 and over, adult: Secondary | ICD-10-CM | POA: Diagnosis not present

## 2021-11-14 DIAGNOSIS — Z9889 Other specified postprocedural states: Secondary | ICD-10-CM

## 2021-11-14 HISTORY — PX: BRONCHIAL BIOPSY: SHX5109

## 2021-11-14 HISTORY — DX: Unspecified hearing loss, unspecified ear: H91.90

## 2021-11-14 HISTORY — PX: BRONCHIAL BRUSHINGS: SHX5108

## 2021-11-14 HISTORY — PX: VIDEO BRONCHOSCOPY WITH RADIAL ENDOBRONCHIAL ULTRASOUND: SHX6849

## 2021-11-14 HISTORY — PX: BRONCHIAL NEEDLE ASPIRATION BIOPSY: SHX5106

## 2021-11-14 LAB — BASIC METABOLIC PANEL
Anion gap: 8 (ref 5–15)
BUN: 10 mg/dL (ref 8–23)
CO2: 25 mmol/L (ref 22–32)
Calcium: 9.4 mg/dL (ref 8.9–10.3)
Chloride: 103 mmol/L (ref 98–111)
Creatinine, Ser: 0.79 mg/dL (ref 0.44–1.00)
GFR, Estimated: >60 mL/min (ref >60)
Glucose, Bld: 114 mg/dL — ABNORMAL HIGH (ref 70–99)
Potassium: 3.6 mmol/L (ref 3.5–5.1)
Sodium: 136 mmol/L (ref 135–145)

## 2021-11-14 LAB — CBC
HCT: 33.7 % — ABNORMAL LOW (ref 36.0–46.0)
Hemoglobin: 11.6 g/dL — ABNORMAL LOW (ref 12.0–15.0)
MCH: 33.4 pg (ref 26.0–34.0)
MCHC: 34.4 g/dL (ref 30.0–36.0)
MCV: 97.1 fL (ref 80.0–100.0)
Platelets: 228 10*3/uL (ref 150–400)
RBC: 3.47 MIL/uL — ABNORMAL LOW (ref 3.87–5.11)
RDW: 12.6 % (ref 11.5–15.5)
WBC: 3.8 10*3/uL — ABNORMAL LOW (ref 4.0–10.5)
nRBC: 0 % (ref 0.0–0.2)

## 2021-11-14 LAB — SARS CORONAVIRUS 2 BY RT PCR (HOSPITAL ORDER, PERFORMED IN ~~LOC~~ HOSPITAL LAB): SARS Coronavirus 2: NEGATIVE

## 2021-11-14 IMAGING — DX DG CHEST 1V PORT
1 series · 1 of 1 positions shown · non-contrast
Comparison: [DATE], [DATE]

CLINICAL DATA: Status post bronchoscopy

EXAM:
PORTABLE CHEST 1 VIEW

[chest]
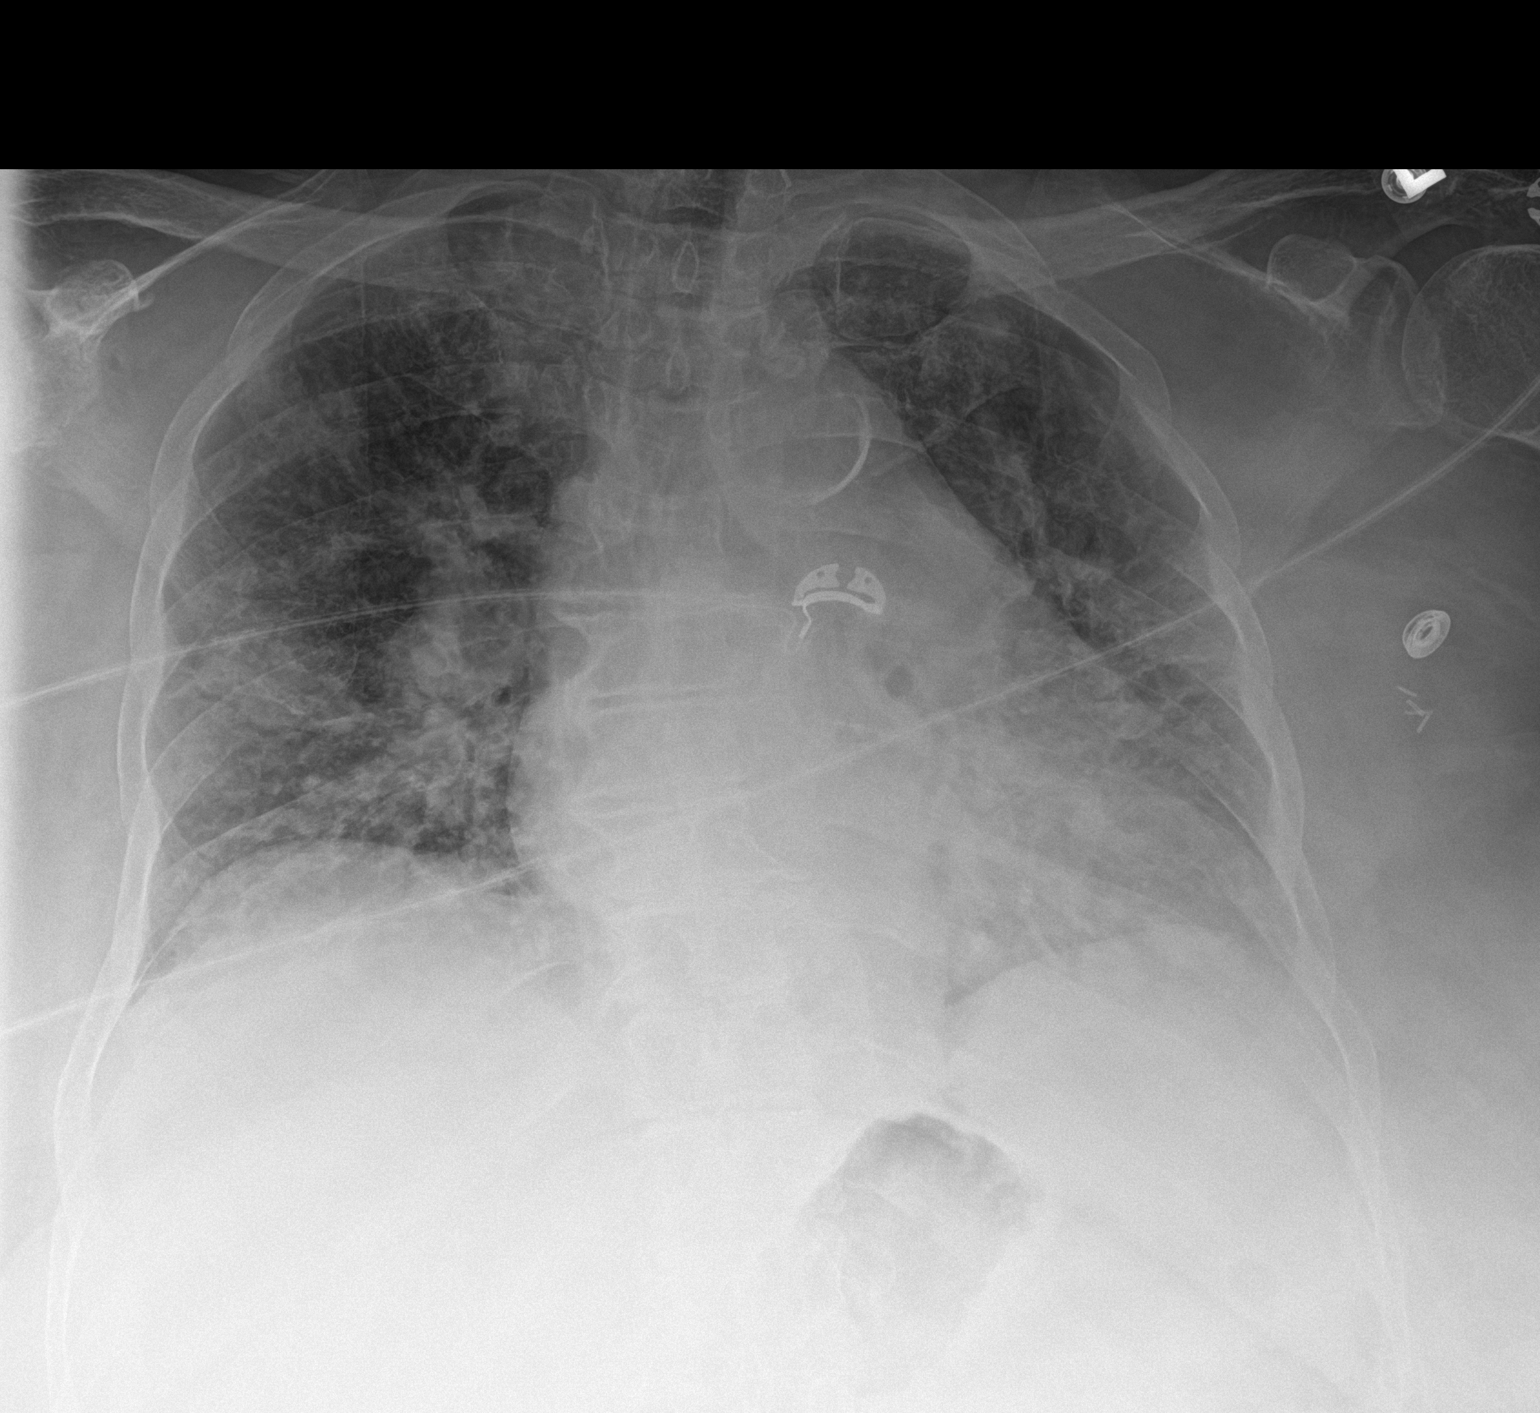

[1 of 1 positions shown; findings below may reference images not displayed]

FINDINGS: Stable heart size. Aortic atherosclerosis. Low lung volumes.
Prominent interstitial markings bilaterally. Numerous nodular
densities compatible with known pulmonary metastatic disease. Is
subtle opacities in the periphery of the right upper lobe may
reflect changes related to bronchoscopy. No pneumothorax.
IMPRESSION: Subtle opacities in the periphery of the right upper lobe may
reflect changes related to bronchoscopy. No pneumothorax.

## 2021-11-14 SURGERY — BRONCHOSCOPY, WITH BIOPSY USING ELECTROMAGNETIC NAVIGATION
Anesthesia: General | Laterality: Bilateral

## 2021-11-14 MED ORDER — CHLORHEXIDINE GLUCONATE 0.12 % MT SOLN
15.0000 mL | Freq: Once | OROMUCOSAL | Status: AC
  Administered 2021-11-14: 15 mL via OROMUCOSAL
  Filled 2021-11-14: qty 15

## 2021-11-14 MED ORDER — PROPOFOL 10 MG/ML IV BOLUS
INTRAVENOUS | Status: DC | PRN
  Administered 2021-11-14: 120 mg via INTRAVENOUS

## 2021-11-14 MED ORDER — ONDANSETRON HCL 4 MG/2ML IJ SOLN
INTRAMUSCULAR | Status: DC | PRN
  Administered 2021-11-14: 4 mg via INTRAVENOUS

## 2021-11-14 MED ORDER — SUGAMMADEX SODIUM 200 MG/2ML IV SOLN
INTRAVENOUS | Status: DC | PRN
  Administered 2021-11-14: 200 mg via INTRAVENOUS

## 2021-11-14 MED ORDER — LIDOCAINE 2% (20 MG/ML) 5 ML SYRINGE
INTRAMUSCULAR | Status: DC | PRN
  Administered 2021-11-14: 60 mg via INTRAVENOUS

## 2021-11-14 MED ORDER — FENTANYL CITRATE (PF) 100 MCG/2ML IJ SOLN: 25.0000 ug | INTRAMUSCULAR | Status: DC | PRN

## 2021-11-14 MED ORDER — ACETAMINOPHEN 10 MG/ML IV SOLN
INTRAVENOUS | Status: AC
  Filled 2021-11-14: qty 100

## 2021-11-14 MED ORDER — ROCURONIUM BROMIDE 10 MG/ML (PF) SYRINGE
PREFILLED_SYRINGE | INTRAVENOUS | Status: DC | PRN
  Administered 2021-11-14: 70 mg via INTRAVENOUS
  Administered 2021-11-14: 10 mg via INTRAVENOUS

## 2021-11-14 MED ORDER — ACETAMINOPHEN 500 MG PO TABS: 1000.0000 mg | ORAL_TABLET | Freq: Once | ORAL | Status: DC

## 2021-11-14 MED ORDER — EPHEDRINE SULFATE-NACL 50-0.9 MG/10ML-% IV SOSY
PREFILLED_SYRINGE | INTRAVENOUS | Status: DC | PRN
Start: 2021-11-14 — End: 2021-11-14
  Administered 2021-11-14 (×3): 5 mg via INTRAVENOUS

## 2021-11-14 MED ORDER — LACTATED RINGERS IV SOLN: INTRAVENOUS | Status: DC

## 2021-11-14 MED ORDER — AMISULPRIDE (ANTIEMETIC) 5 MG/2ML IV SOLN: 10.0000 mg | Freq: Once | INTRAVENOUS | Status: DC | PRN

## 2021-11-14 MED ORDER — FENTANYL CITRATE (PF) 250 MCG/5ML IJ SOLN
INTRAMUSCULAR | Status: DC | PRN
  Administered 2021-11-14 (×2): 50 ug via INTRAVENOUS

## 2021-11-14 MED ORDER — ACETAMINOPHEN 10 MG/ML IV SOLN
INTRAVENOUS | Status: DC | PRN
  Administered 2021-11-14: 1000 mg via INTRAVENOUS

## 2021-11-14 NOTE — Anesthesia Postprocedure Evaluation (Signed)
Anesthesia Post Note ? ?Patient: Sheila Jacobs ? ?Procedure(s) Performed: ROBOTIC ASSISTED NAVIGATIONAL BRONCHOSCOPY (Bilateral) ?BRONCHIAL NEEDLE ASPIRATION BIOPSIES ?RADIAL ENDOBRONCHIAL ULTRASOUND ?BRONCHIAL BIOPSIES ?BRONCHIAL BRUSHINGS ? ?  ? ?Patient location during evaluation: PACU ?Anesthesia Type: General ?Level of consciousness: awake and alert ?Pain management: pain level controlled ?Vital Signs Assessment: post-procedure vital signs reviewed and stable ?Respiratory status: spontaneous breathing, nonlabored ventilation, respiratory function stable and patient connected to nasal cannula oxygen ?Cardiovascular status: blood pressure returned to baseline and stable ?Postop Assessment: no apparent nausea or vomiting ?Anesthetic complications: no ? ? ?No notable events documented. ? ?Last Vitals:  ?Vitals:  ? 11/14/21 1457 11/14/21 1504  ?BP: (!) 143/56 (!) 160/74  ?Pulse: 71 75  ?Resp: 15 (!) 21  ?Temp:    ?SpO2: 95% 95%  ?  ?Last Pain:  ?Vitals:  ? 11/14/21 1436  ?TempSrc: Temporal  ?PainSc: 0-No pain  ? ? ?  ?  ?  ?  ?  ?  ? ?Effie Berkshire ? ? ? ? ?

## 2021-11-14 NOTE — Anesthesia Procedure Notes (Signed)
Procedure Name: Intubation ?Date/Time: 11/14/2021 12:50 PM ?Performed by: Annamary Carolin, CRNA ?Pre-anesthesia Checklist: Patient identified, Emergency Drugs available, Suction available and Patient being monitored ?Patient Re-evaluated:Patient Re-evaluated prior to induction ?Oxygen Delivery Method: Circle System Utilized ?Preoxygenation: Pre-oxygenation with 100% oxygen ?Induction Type: IV induction ?Ventilation: Mask ventilation without difficulty ?Tube type: Oral ?Tube size: 8.5 mm ?Number of attempts: 1 ?Airway Equipment and Method: Stylet ?Placement Confirmation: ETT inserted through vocal cords under direct vision, positive ETCO2 and breath sounds checked- equal and bilateral ?Secured at: 22 cm ?Tube secured with: Tape ?Dental Injury: Teeth and Oropharynx as per pre-operative assessment  ?Comments: With ease. ? ? ? ? ?

## 2021-11-14 NOTE — Discharge Instructions (Signed)
Flexible Bronchoscopy, Care After ?This sheet gives you information about how to care for yourself after your test. Your doctor may also give you more specific instructions. If you have problems or questions, contact your doctor. ?Follow these instructions at home: ?Eating and drinking ?Do not eat or drink anything (not even water) for 2 hours after your test, or until your numbing medicine (local anesthetic) wears off. ?When your numbness is gone and your cough and gag reflexes have come back, you may: ?Eat only soft foods. ?Slowly drink liquids. ?The day after the test, go back to your normal diet. ?Driving ?Do not drive for 24 hours if you were given a medicine to help you relax (sedative). ?Do not drive or use heavy machinery while taking prescription pain medicine. ?General instructions ? ?Take over-the-counter and prescription medicines only as told by your doctor. ?Return to your normal activities as told. Ask what activities are safe for you. ?Do not use any products that have nicotine or tobacco in them. This includes cigarettes and e-cigarettes. If you need help quitting, ask your doctor. ?Keep all follow-up visits as told by your doctor. This is important. It is very important if you had a tissue sample (biopsy) taken. ?Get help right away if: ?You have shortness of breath that gets worse. ?You get light-headed. ?You feel like you are going to pass out (faint). ?You have chest pain. ?You cough up: ?More than a little blood. ?More blood than before. ?Summary ?Do not eat or drink anything (not even water) for 2 hours after your test, or until your numbing medicine wears off. ?Do not use cigarettes. Do not use e-cigarettes. ?Get help right away if you have chest pain. ? ?This information is not intended to replace advice given to you by your health care provider. Make sure you discuss any questions you have with your health care provider. ?Document Released: 06/24/2009 Document Revised: 08/09/2017 Document  Reviewed: 09/14/2016 ?Elsevier Patient Education ? [place]. ? ?

## 2021-11-14 NOTE — Anesthesia Preprocedure Evaluation (Addendum)
Anesthesia Evaluation  ?Patient identified by MRN, date of birth, ID band ?Patient awake ? ? ? ?Reviewed: ?Allergy & Precautions, NPO status , Patient's Chart, lab work & pertinent test results ? ?Airway ?Mallampati: III ? ?TM Distance: >3 FB ?Neck ROM: Full ? ? ? Dental ? ?(+) Dental Advisory Given ?  ?Pulmonary ?sleep apnea , former smoker,  ?  ?breath sounds clear to auscultation ? ? ? ? ? ? Cardiovascular ?hypertension, Pt. on medications ?+ angina + DVT  ? ?Rhythm:Regular Rate:Normal ? ? ?  ?Neuro/Psych ?negative neurological ROS ?   ? GI/Hepatic ?Neg liver ROS, GERD  ,  ?Endo/Other  ?Hypothyroidism Morbid obesity ? Renal/GU ?negative Renal ROS  ? ?  ?Musculoskeletal ? ?(+) Arthritis ,  ? Abdominal ?  ?Peds ? Hematology ? ?(+) Blood dyscrasia, anemia ,   ?Anesthesia Other Findings ? ? Reproductive/Obstetrics ? ?  ? ? ? ? ? ? ? ? ? ? ? ? ? ?  ?  ? ? ? ? ? ? ? ?Anesthesia Physical ?Anesthesia Plan ? ?ASA: 3 ? ?Anesthesia Plan: General  ? ?Post-op Pain Management: Tylenol PO (pre-op)* and Minimal or no pain anticipated  ? ?Induction: Intravenous ? ?PONV Risk Score and Plan: 3 and Dexamethasone, Ondansetron and Treatment may vary due to age or medical condition ? ?Airway Management Planned: Oral ETT ? ?Additional Equipment: None ? ?Intra-op Plan:  ? ?Post-operative Plan: Extubation in OR ? ?Informed Consent: I have reviewed the patients History and Physical, chart, labs and discussed the procedure including the risks, benefits and alternatives for the proposed anesthesia with the patient or authorized representative who has indicated his/her understanding and acceptance.  ? ? ? ?Dental advisory given ? ?Plan Discussed with:  ? ?Anesthesia Plan Comments:   ? ? ? ? ? ? ?Anesthesia Quick Evaluation ? ?

## 2021-11-14 NOTE — Transfer of Care (Signed)
Immediate Anesthesia Transfer of Care Note ? ?Patient: JORDANNA HENDRIE ? ?Procedure(s) Performed: ROBOTIC ASSISTED NAVIGATIONAL BRONCHOSCOPY (Bilateral) ?BRONCHIAL NEEDLE ASPIRATION BIOPSIES ?RADIAL ENDOBRONCHIAL ULTRASOUND ?BRONCHIAL BIOPSIES ?BRONCHIAL BRUSHINGS ? ?Patient Location: PACU ? ?Anesthesia Type:General ? ?Level of Consciousness: awake, alert , oriented and patient cooperative ? ?Airway & Oxygen Therapy: Patient Spontanous Breathing and Patient connected to face mask oxygen ? ?Post-op Assessment: Report given to RN, Post -op Vital signs reviewed and stable and Patient moving all extremities X 4 ? ?Post vital signs: Reviewed and stable ? ?Last Vitals:  ?Vitals Value Taken Time  ?BP 156/60 11/14/21 1434  ?Temp    ?Pulse 73 11/14/21 1435  ?Resp 16 11/14/21 1435  ?SpO2 100 % 11/14/21 1435  ?Vitals shown include unvalidated device data. ? ?Last Pain:  ?Vitals:  ? 11/14/21 1128  ?TempSrc:   ?PainSc: 0-No pain  ?   ? ?  ? ?Complications: No notable events documented. ?

## 2021-11-14 NOTE — Op Note (Signed)
Video Bronchoscopy with Robotic Assisted Bronchoscopic Navigation  ? ?Date of Operation: 11/14/2021  ? ?Pre-op Diagnosis: Multiple pulmonary nodules ? ?Post-op Diagnosis: Multiple pulmonary nodule ? ?Surgeon: Garner Nash, DO ? ?Assistants: none  ? ?Anesthesia: General endotracheal anesthesia ? ?Operation: Flexible video fiberoptic bronchoscopy with robotic assistance and biopsies. ? ?Estimated Blood Loss: Minimal ? ?Complications: None ? ?Indications and History: ?Sheila Jacobs is a 71 y.o. female with history of multiple pulmonary nodules. The risks, benefits, complications, treatment options and expected outcomes were discussed with the patient.  The possibilities of pneumothorax, pneumonia, reaction to medication, pulmonary aspiration, perforation of a viscus, bleeding, failure to diagnose a condition and creating a complication requiring transfusion or operation were discussed with the patient who freely signed the consent.   ? ?Description of Procedure: ?The patient was seen in the Preoperative Area, was examined and was deemed appropriate to proceed.  The patient was taken to [facility] endoscopy room 3, identified as Sheila Jacobs and the procedure verified as Flexible Video Fiberoptic Bronchoscopy.  A Time Out was held and the above information confirmed.  ? ?Prior to the date of the procedure a high-resolution CT scan of the chest was performed. Utilizing ION software program a virtual tracheobronchial tree was generated to allow the creation of distinct navigation pathways to the patient's parenchymal abnormalities. After being taken to the operating room general anesthesia was initiated and the patient  was orally intubated. The video fiberoptic bronchoscope was introduced via the endotracheal tube and a general inspection was performed which showed normal right and left lung anatomy, aspiration of the bilateral mainstems was completed to remove any remaining secretions. Robotic catheter inserted into  patient's endotracheal tube.  ? ?Target #1 right middle lobe: ?The distinct navigation pathways prepared prior to this procedure were then utilized to navigate to patient's lesion identified on CT scan. The robotic catheter was secured into place and the vision probe was withdrawn.  Lesion location was approximated using fluoroscopy, three-dimensional cone beam CT imaging and radial endobronchial ultrasound for peripheral targeting. Under fluoroscopic guidance transbronchial needle brushings, transbronchial needle biopsies, and transbronchial forceps biopsies were performed to be sent for cytology and pathology.  ? ?Target #2 right upper lobe: ?The distinct navigation pathways prepared prior to this procedure were then utilized to navigate to patient's lesion identified on CT scan. The robotic catheter was secured into place and the vision probe was withdrawn.  Lesion location was approximated using fluoroscopy, three-dimensional cone beam CT imaging and radial endobronchial ultrasound for peripheral targeting. Under fluoroscopic guidance transbronchial needle brushings, transbronchial needle biopsies, and transbronchial forceps biopsies were performed to be sent for cytology and pathology. ? ?At the end of the procedure a general airway inspection was performed and there was no evidence of active bleeding. The bronchoscope was removed.  The patient tolerated the procedure well. There was no significant blood loss and there were no obvious complications. A post-procedural chest x-ray is pending. ? ?Samples Target #1: ?1. Transbronchial needle brushings from right middle lobe ?2. Transbronchial Wang needle biopsies from right middle lobe ?3. Transbronchial forceps biopsies from right middle lobe ? ?Samples Target #2: ?1. Transbronchial needle brushings from right upper lobe ?2. Transbronchial Wang needle biopsies from right upper lobe ?3. Transbronchial forceps biopsies from right upper lobe ? ?Plans:  ?The patient  will be discharged from the PACU to home when recovered from anesthesia and after chest x-ray is reviewed. We will review the cytology, pathology results with the patient when they become  available. Outpatient followup will be with Octavio Graves Sherece Gambrill, DO. ? ?Garner Nash, DO ? Pulmonary Critical Care ?11/14/2021 2:40 PM    ? ?

## 2021-11-14 NOTE — Interval H&P Note (Signed)
History and Physical Interval Note: ? ?11/14/2021 ?12:29 PM ? ?Sheila Jacobs  has presented today for surgery, with the diagnosis of Lung nodules.  The various methods of treatment have been discussed with the patient and family. After consideration of risks, benefits and other options for treatment, the patient has consented to  Procedure(s) with comments: ?ROBOTIC ASSISTED NAVIGATIONAL BRONCHOSCOPY (Bilateral) - ION w/ CIOS as a surgical intervention.  The patient's history has been reviewed, patient examined, no change in status, stable for surgery.  I have reviewed the patient's chart and labs.  Questions were answered to the patient's satisfaction.   ? ? ?Octavio Graves Jeramine Delis ? ? ?

## 2021-11-15 NOTE — Assessment & Plan Note (Signed)
Left lumpectomy 04/11/2016: IDC grade 3, 1.2 cm, IG DCIS, LVI present, 0/8 lymph nodes negative, ER 100%, PR 90%, HER-2 negative, Ki-67 10%, T1 cN0 stage I a pathologic stage ?? ?Adj XRT 06/18/16 to 07/30/16 ?? ?Treatment Plan: Adj Anti-estrogen therapy with Letrozole 2.5 mg daily?started 08/29/2016 ?? ?Letrozole toxicities:?Denies any hot flashes. Occasional lightheadedness ? ? ?Breast cancer surveillance: ?1.? CT chest done 08/12/2021: Multiple bilateral lung nodules new since 2019 differential is metastatic disease versus infection ?2.?mammogram??scheduled for 10/18/21 ? ?Bronchoscopy:  ?

## 2021-11-15 NOTE — Progress Notes (Signed)
?  HEMATOLOGY-ONCOLOGY TELEPHONE VISIT PROGRESS NOTE ? ?I connected with WILLIETTE LOEWE on 11/16/2021 at  9:00 AM EST by telephone and verified that I am speaking with the correct person using two identifiers.  ?I discussed the limitations, risks, security and privacy concerns of performing an evaluation and management service by telephone and the availability of in person appointments.  ?I also discussed with the patient that there may be a patient responsible charge related to this service. The patient expressed understanding and agreed to proceed.  ? ?History of Present Illness: Sheila Jacobs is a 71 y.o. female with above-mentioned history of left breast cancer treated with lumpectomy, radiation, and is currently on anastrozole therapy. Mammogram on 10/18/2021 showed no evidence of malignancy. She presents via telephone today for follow-up.  She underwent a bronchoscopy for evaluation of the lung nodules with Dr. Valeta Harms on 11/14/2021.  Pathology is not back yet. ? ?Oncology History  ?Breast cancer of upper-outer quadrant of left female breast ([place])  ?01/27/2016 Initial Diagnosis  ? Screening mammogram: Left breast mass 9 mm, grade 1-2 IDC with DCIS, ER 100%, PR 90%, Ki-67 10%, HER-2 negative ratio 1.5, T1 BN 0 stage IA clinical stage ?  ?04/11/2016 Surgery  ? Left lumpectomy ([facility]): IDC grade 3, 1.2 cm, IG DCIS, LVI present, 0/8 lymph nodes negative, ER 100%, PR 90%, HER-2 negative, Ki-67 10%, T1 cN0 stage I a pathologic stage ?  ?06/18/2016 - 07/30/2016 Radiation Therapy  ? Adj XRT Isidore Moos): 1) Left Breast / 45 Gy in 25 fractions.  2) Left Breast Boost /  6 Gy in 3 fractions ?  ?08/29/2016 -  Anti-estrogen oral therapy  ? Anastrozole daily ?  ? ? ?Observations/Objective:  ? ?  ?Assessment Plan:  ?Breast cancer of upper-outer quadrant of left female breast ([place]) ?Left lumpectomy 04/11/2016: IDC grade 3, 1.2 cm, IG DCIS, LVI present, 0/8 lymph nodes negative, ER 100%, PR 90%, HER-2 negative, Ki-67 10%, T1 cN0  stage I a pathologic stage ?  ?Adj XRT 06/18/16 to 07/30/16 ?  ?Treatment Plan: Adj Anti-estrogen therapy with Letrozole 2.5 mg daily started 08/29/2016 ?  ?Letrozole toxicities: Denies any hot flashes. Occasional lightheadedness ?   ?Breast cancer surveillance: ?1.  CT chest done 08/12/2021: Multiple bilateral lung nodules new since 2019 differential is metastatic disease versus infection ?2. mammogram  scheduled for 10/18/21 ? ?Bronchoscopy 11/14/2021 by Dr. Valeta Harms: Awaiting pathology results.  ?I discussed with the patient that if he did not have definitive answers from the bronchoscopy we may have to consider radiology guided biopsy. ?I informed her that we will discuss the appropriate next steps and inform her of the decision. ? ? ?I discussed the assessment and treatment plan with the patient. The patient was provided an opportunity to ask questions and all were answered. The patient agreed with the plan and demonstrated an understanding of the instructions. The patient was advised to call back or seek an in-person evaluation if the symptoms worsen or if the condition fails to improve as anticipated.  ? ?Total time spent: 12 mins including non-face to face time and time spent for planning, charting and coordination of care ? ?Rulon Eisenmenger, MD ?11/16/2021  ? ? I, Thana Ates, am acting as scribe for Nicholas Lose, MD. ? ?I have reviewed the above documentation for accuracy and completeness, and I agree with the above. ?  ? ?

## 2021-11-16 ENCOUNTER — Other Ambulatory Visit: Payer: Self-pay | Admitting: *Deleted

## 2021-11-16 ENCOUNTER — Inpatient Hospital Stay: Payer: Medicare Other | Attending: Hematology and Oncology | Admitting: Hematology and Oncology

## 2021-11-16 ENCOUNTER — Encounter (HOSPITAL_COMMUNITY): Payer: Self-pay | Admitting: Pulmonary Disease

## 2021-11-16 ENCOUNTER — Other Ambulatory Visit: Payer: Self-pay

## 2021-11-16 DIAGNOSIS — Z170 Estrogen receptor positive status [ER+]: Secondary | ICD-10-CM

## 2021-11-16 DIAGNOSIS — C50412 Malignant neoplasm of upper-outer quadrant of left female breast: Secondary | ICD-10-CM

## 2021-11-16 DIAGNOSIS — Z79899 Other long term (current) drug therapy: Secondary | ICD-10-CM | POA: Insufficient documentation

## 2021-11-16 NOTE — Progress Notes (Signed)
Received call from pt with complaint of intermittent pressure sensations in her brain x several weeks.  Verbal orders received by MD for pt to undergo Brain MRI for further evaluation.  Orders placed, RN will schedule once PA is approved. Pt educated and verbalized understanding.  ?

## 2021-11-17 ENCOUNTER — Ambulatory Visit: Payer: Medicare Other | Admitting: Acute Care

## 2021-11-23 ENCOUNTER — Ambulatory Visit (HOSPITAL_COMMUNITY): Payer: Medicare Other

## 2021-11-27 ENCOUNTER — Telehealth: Payer: Self-pay | Admitting: Pulmonary Disease

## 2021-11-27 LAB — CYTOLOGY - NON PAP

## 2021-11-27 NOTE — Telephone Encounter (Signed)
Called and spoke with patient. I let the patient know that Dr. Valeta Harms will be calling her with the results. ?

## 2021-11-28 ENCOUNTER — Telehealth: Payer: Self-pay | Admitting: Hematology and Oncology

## 2021-11-28 NOTE — Telephone Encounter (Signed)
Spoke to patient. ?She is aware that Dr. Valeta Harms has attempted to contact her. She would like a call back on [phone]. ?She stated that she has MRI scheduled for tomorrow. She seen oncology on 11/16/2021. She is not sure if she has an upcoming appt.  ? ?Dr. Valeta Harms, please advise. Thanks ?

## 2021-11-28 NOTE — Telephone Encounter (Signed)
.  Called patient to schedule appointment per 3/21, patient is aware of date and time.   ?

## 2021-11-28 NOTE — Telephone Encounter (Signed)
Spoke to patient.  ?She stated that she would contact Dr. Geralyn Flash office to schedule OV.  ?Nothing further needed at this time.  ?

## 2021-11-29 ENCOUNTER — Ambulatory Visit (HOSPITAL_COMMUNITY)
Admission: RE | Admit: 2021-11-29 | Discharge: 2021-11-29 | Disposition: A | Discharge status: Home / Self Care | Payer: Medicare Other | Source: Ambulatory Visit | Attending: Hematology and Oncology | Admitting: Hematology and Oncology

## 2021-11-29 ENCOUNTER — Other Ambulatory Visit: Payer: Self-pay

## 2021-11-29 DIAGNOSIS — C50412 Malignant neoplasm of upper-outer quadrant of left female breast: Secondary | ICD-10-CM | POA: Diagnosis present

## 2021-11-29 DIAGNOSIS — Z170 Estrogen receptor positive status [ER+]: Secondary | ICD-10-CM | POA: Diagnosis present

## 2021-11-29 IMAGING — MR MR HEAD WO/W CM
15 series · 48 of 48 positions shown · IV contrast (gadavist)
Comparison: MRI [DATE] without report.

CLINICAL DATA: Metastatic disease evaluation

EXAM:
MRI HEAD WITHOUT AND WITH CONTRAST
TECHNIQUE: Multiplanar, multiecho pulse sequences of the brain and surrounding
structures were obtained without and with intravenous contrast.
CONTRAST:  10mL GADAVIST GADOBUTROL 1 MMOL/ML IV SOLN

[Series 5: DWI · axial · 3.0mm · 1.36mm/px · z∈[-63,+77]mm · 6 of 96 slices shown (1 of 2)]
[im 1/96]
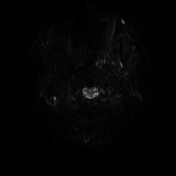
[im 20/96]
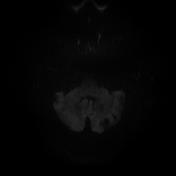
[im 39/96]
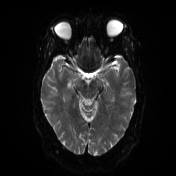
[im 58/96]
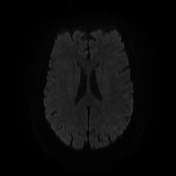
[im 77/96]
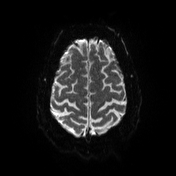
[im 96/96]
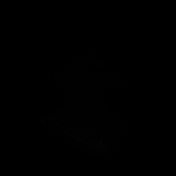

[Series 6: DWI · axial · 3.0mm · 1.36mm/px · z∈[-63,+77]mm · 3 of 48 slices shown (2 of 2)]
[im 1/48]
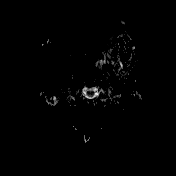
[im 24/48]
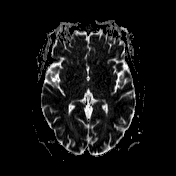
[im 48/48]
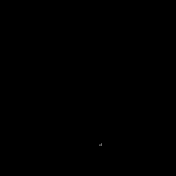

[Series 7: T1 · sagittal · 5.0mm · 0.75mm/px · 1 of 24 slices shown (1 of 4)]
[im 1/24]
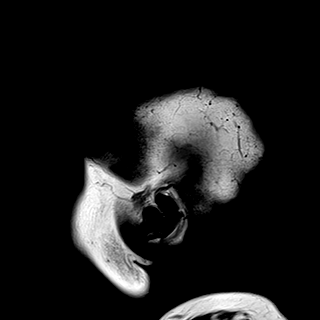

[Series 8: T2 · axial · 5.0mm · 0.62mm/px · 1 of 24 slices shown]
[im 1/24]
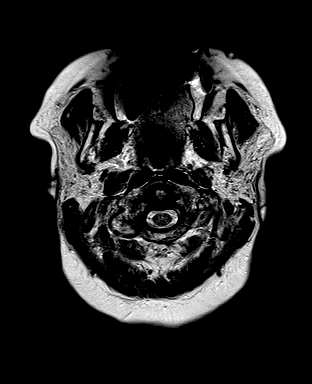

[Series 9: swi_images · axial · 3.0mm · 0.75mm/px · z∈[-65,+76]mm · 3 of 48 slices shown]
[im 1/48]
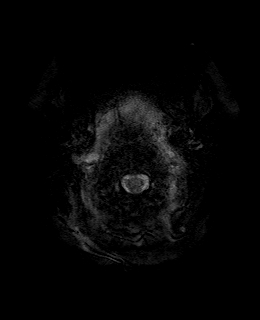
[im 24/48]
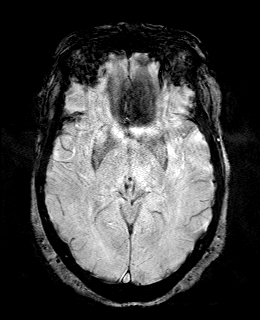
[im 48/48]
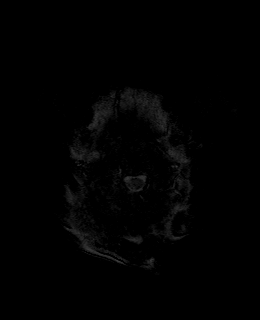

[Series 11: FLAIR · axial · 3.0mm · 0.75mm/px · z∈[-71,+82]mm · 3 of 52 slices shown]
[im 1/52]
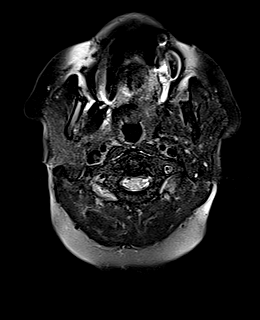
[im 26/52]
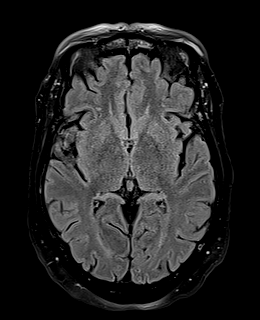
[im 52/52]
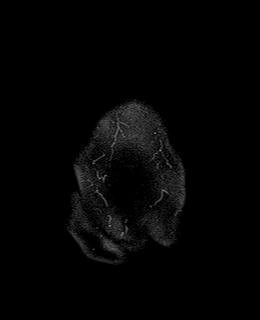

[Series 12: T1 · axial · 1.0mm · 0.94mm/px · z∈[-61,+81]mm · 9 of 144 slices shown (2 of 4)]
[im 1/144]
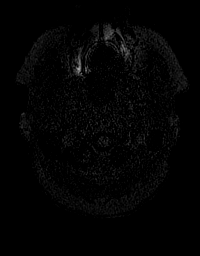
[im 18/144]
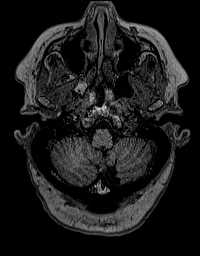
[im 36/144]
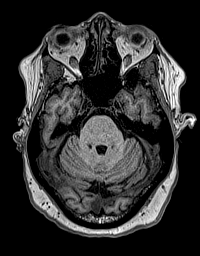
[im 54/144]
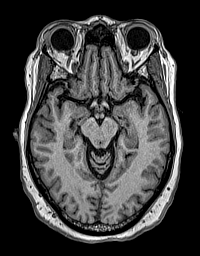
[im 72/144]
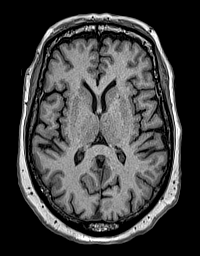
[im 90/144]
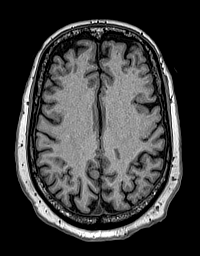
[im 108/144]
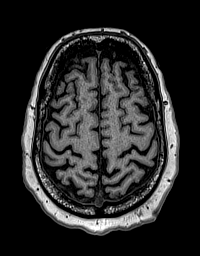
[im 126/144]
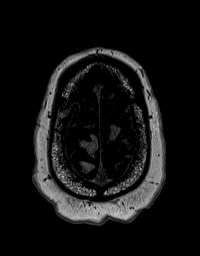
[im 144/144]
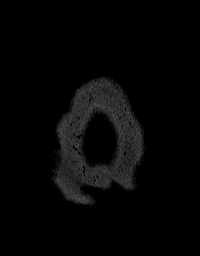

[Series 13: cor dwi_tracew · coronal · 5.0mm · 1.53mm/px · 3 of 48 slices shown]
[im 1/48]
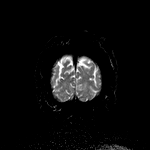
[im 24/48]
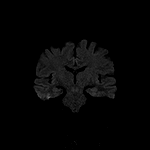
[im 48/48]
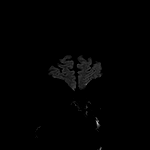

[Series 14: cor dwi_adc · coronal · 5.0mm · 1.53mm/px · 1 of 24 slices shown]
[im 1/24]
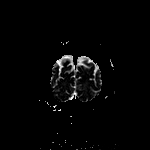

[Series 15: T2 post-contrast · coronal · 5.0mm · 0.57mm/px · 2 of 26 slices shown]
[im 1/26]
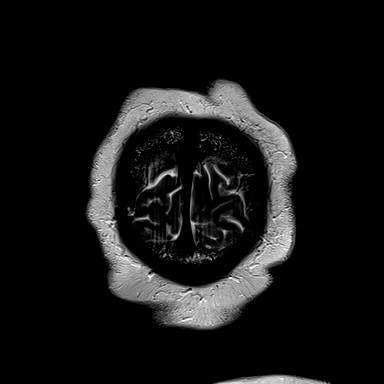
[im 26/26]
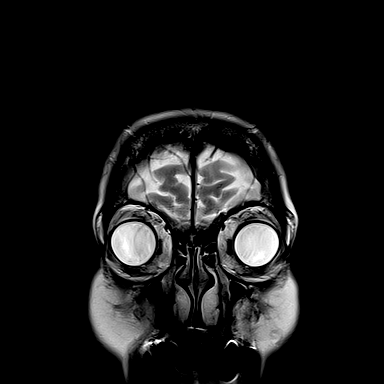

[Series 16: T1 post-contrast · sagittal · 5.0mm · 0.75mm/px · 1 of 24 slices shown (1 of 3)]
[im 1/24]
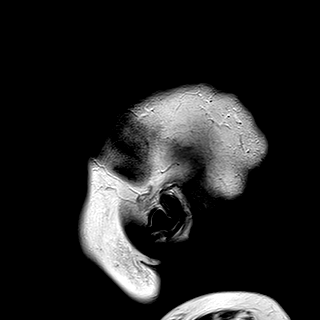

[Series 17: T1 post-contrast · axial · 1.0mm · 0.94mm/px · z∈[-61,+81]mm · 9 of 144 slices shown (2 of 3)]
[im 1/144]
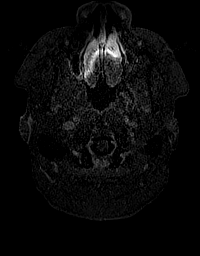
[im 18/144]
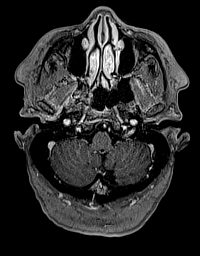
[im 36/144]
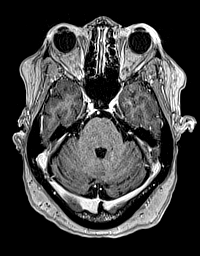
[im 54/144]
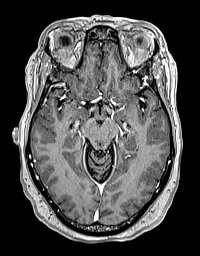
[im 72/144]
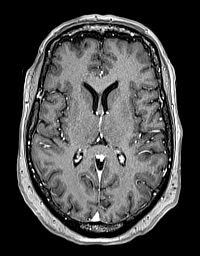
[im 90/144]
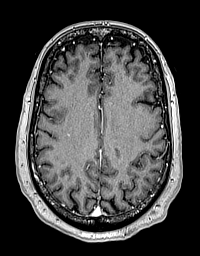
[im 108/144]
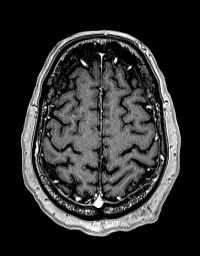
[im 126/144]
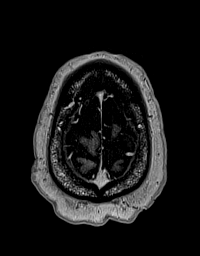
[im 144/144]
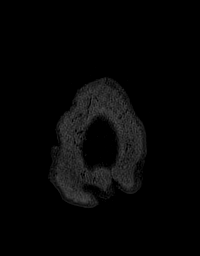

[Series 18: T1 · sagittal · 4.0mm · 0.94mm/px · 2 of 30 slices shown (3 of 4)]
[im 1/30]
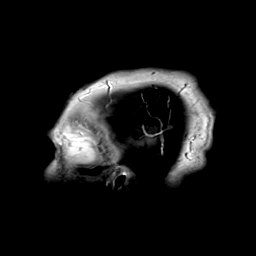
[im 30/30]
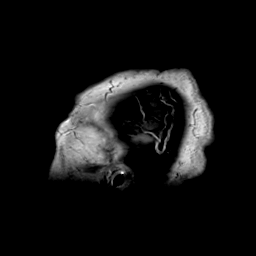

[Series 19: T1 · coronal · 4.0mm · 0.94mm/px · 2 of 36 slices shown (4 of 4)]
[im 1/36]
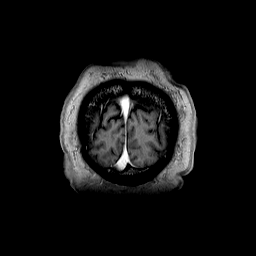
[im 36/36]
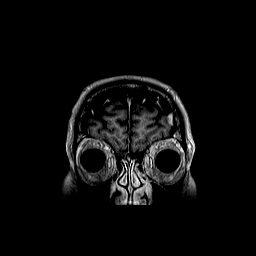

[Series 20: T1 post-contrast · coronal · 5.0mm · 0.43mm/px · 2 of 26 slices shown (3 of 3)]
[im 1/26]
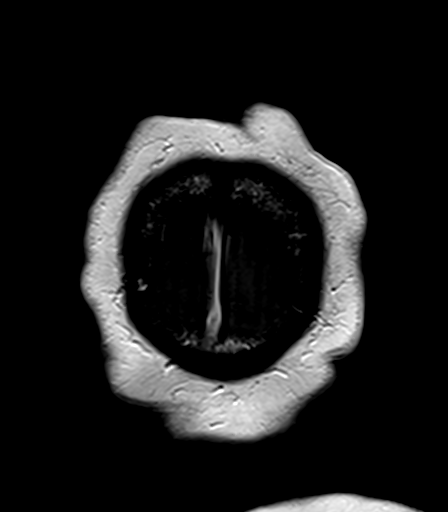
[im 26/26]
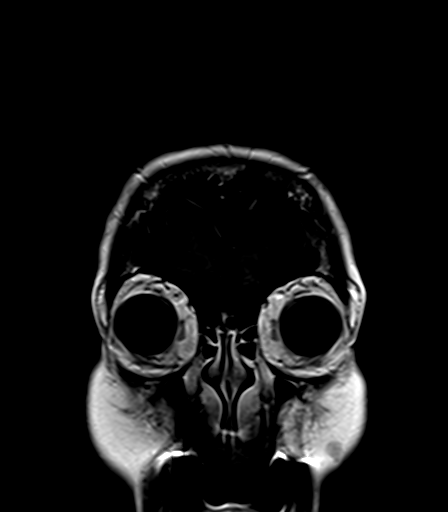

[48 of 48 positions shown; findings below may reference images not displayed]

FINDINGS: Brain: No acute infarction, hemorrhage, hydrocephalus, extra-axial
collection or mass lesion. Tiny focus of possible enhancement in the
right frontal white matter (series 17, image 90), which may have
subtle T1 hyperintense correlate (series 12, image 90). Multiple
remote lacunar infarcts in bilateral cerebellar hemispheres. Mild
scattered T2/FLAIR hyperintensities in the white matter, nonspecific
but compatible with chronic microvascular ischemic disease that is
mild for age.

Vascular: Major arterial flow voids are maintained skull base.

Skull and upper cervical spine: Incompletely imaged focus of T1
hypointensity in the right eccentric C2 vertebral body (series 7,
image 12).

Sinuses/Orbits: Mild paranasal sinus mucosal thickening. No acute
orbital findings.

Other: Small left mastoid effusion.
IMPRESSION: 1. Tiny focus of possible enhancement in the right frontal white
matter. Favor a benign etiology, but recommend 3-4 week follow-up
MRI with contrast to ensure stability and/or resolution and exclude
metastatic disease.
2. Incompletely imaged focus of T1 hypointensity in the right
eccentric C2 vertebral body. This may be degenerative; however,
osseous metastatic disease is not excluded. Recommend attention on
the recommended follow-up.
3. Multiple remote lacunar infarcts in bilateral cerebellar
hemispheres.

## 2021-11-29 MED ORDER — GADOBUTROL 1 MMOL/ML IV SOLN
10.0000 mL | Freq: Once | INTRAVENOUS | Status: AC | PRN
  Administered 2021-11-29: 10 mL via INTRAVENOUS

## 2021-11-29 NOTE — Telephone Encounter (Signed)
Noted  

## 2021-12-03 ENCOUNTER — Other Ambulatory Visit: Payer: Self-pay | Admitting: Hematology and Oncology

## 2021-12-04 NOTE — Progress Notes (Signed)
? ?Patient Care Team: ?Arthur Holms, NP as PCP - General (Nurse Practitioner) ?Minus Breeding, MD as PCP - Cardiology (Cardiology) ?Sueanne Margarita, MD as PCP - Sleep Medicine (Cardiology) ?Nicholas Lose, MD as Consulting Physician (Hematology and Oncology) ?Excell Seltzer, MD (Inactive) as Consulting Physician (General Surgery) ?Gardenia Phlegm, NP as Nurse Practitioner (Hematology and Oncology) ?Eppie Gibson, MD as Attending Physician (Radiation Oncology) ? ?DIAGNOSIS:  ?Encounter Diagnosis  ?Name Primary?  ? Malignant neoplasm of upper-outer quadrant of left breast in female, estrogen receptor positive ([place])   ? ? ?SUMMARY OF ONCOLOGIC HISTORY: ?Oncology History  ?Breast cancer of upper-outer quadrant of left female breast ([place])  ?01/27/2016 Initial Diagnosis  ? Screening mammogram: Left breast mass 9 mm, grade 1-2 IDC with DCIS, ER 100%, PR 90%, Ki-67 10%, HER-2 negative ratio 1.5, T1 BN 0 stage IA clinical stage ?  ?04/11/2016 Surgery  ? Left lumpectomy ([facility]): IDC grade 3, 1.2 cm, IG DCIS, LVI present, 0/8 lymph nodes negative, ER 100%, PR 90%, HER-2 negative, Ki-67 10%, T1 cN0 stage I a pathologic stage ?  ?06/18/2016 - 07/30/2016 Radiation Therapy  ? Adj XRT Isidore Moos): 1) Left Breast / 45 Gy in 25 fractions.  2) Left Breast Boost /  6 Gy in 3 fractions ?  ?08/29/2016 -  Anti-estrogen oral therapy  ? Anastrozole daily ?  ? ? ?CHIEF COMPLIANT: left breast cancer ? ?INTERVAL HISTORY: Sheila Jacobs is a 71 y.o. female with above-mentioned history of left breast cancer treated with lumpectomy, radiation, and is currently on anastrozole therapy. Mammogram on 10/18/2021 showed no evidence of malignancy.  She underwent biopsy of lung nodules which came back as metastatic breast cancer and is here today to discuss the results. ? ?ALLERGIES:  is allergic to cortisone, effexor [venlafaxine], lexapro [escitalopram], and xarelto [rivaroxaban]. ? ?MEDICATIONS:  ?Current Outpatient Medications   ?Medication Sig Dispense Refill  ? abemaciclib (VERZENIO) 100 MG tablet Take 1 tablet (100 mg total) by mouth 2 (two) times daily. Swallow tablets whole. Do not chew, crush, or split tablets before swallowing. 60 tablet 0  ? letrozole (FEMARA) 2.5 MG tablet Take 1 tablet (2.5 mg total) by mouth daily. 90 tablet 3  ? acetaminophen (TYLENOL) 500 MG tablet Take 500 mg by mouth every 6 (six) hours as needed for moderate pain or headache.    ? Cholecalciferol (VITAMIN D) 125 MCG (5000 UT) CAPS Take 5,000 Units by mouth daily.    ? ELIQUIS 5 MG TABS tablet TAKE 1 TABLET BY MOUTH TWICE A DAY 180 tablet 1  ? Ferrous Gluconate (IRON 27 PO) Take 1 tablet by mouth 2 (two) times daily.    ? gabapentin (NEURONTIN) 100 MG capsule Take 100 mg by mouth at bedtime.    ? hydrochlorothiazide (HYDRODIURIL) 25 MG tablet Take 1 tablet (25 mg total) by mouth daily. 90 tablet 3  ? levothyroxine (SYNTHROID) 137 MCG tablet TAKE 1 TABLET BY MOUTH DAILY BEFORE BREAKFAST. 90 tablet 2  ? metoprolol succinate (TOPROL-XL) 50 MG 24 hr tablet Take 50 mg by mouth daily.    ? nitroGLYCERIN (NITROSTAT) 0.4 MG SL tablet 0.4 mg every 5 (five) minutes as needed for chest pain.    ? omeprazole (PRILOSEC) 20 MG capsule Take 20 mg by mouth daily.    ? triamcinolone cream (KENALOG) 0.1 % 1 application daily as needed (Rash).    ? vitamin B-12 (CYANOCOBALAMIN) 1000 MCG tablet Take 1,000 mcg by mouth daily.    ? ?No current facility-administered medications for this  visit.  ? ? ?PHYSICAL EXAMINATION: ?ECOG PERFORMANCE STATUS: 1 - Symptomatic but completely ambulatory ? ?Vitals:  ? 12/05/21 1138  ?BP: (!) 146/64  ?Pulse: 83  ?Resp: 18  ?Temp: 97.9 ?F (36.6 ?C)  ?SpO2: 100%  ? ?Filed Weights  ? 12/05/21 1138  ?Weight: 295 lb 12.8 oz (134.2 kg)  ? ?  ? ?LABORATORY DATA:  ?I have reviewed the data as listed ? ?  Latest Ref Rng & Units 11/14/2021  ? 11:38 AM 08/11/2021  ?  4:40 PM 08/17/2020  ?  9:43 AM  ?CMP  ?Glucose 70 - 99 mg/dL 114   100   111    ?BUN 8 - 23 mg/dL  10   9     ?Creatinine 0.44 - 1.00 mg/dL 0.79   0.80     ?Sodium 135 - 145 mmol/L 136   139     ?Potassium 3.5 - 5.1 mmol/L 3.6   3.4     ?Chloride 98 - 111 mmol/L 103   102     ?CO2 22 - 32 mmol/L 25   29     ?Calcium 8.9 - 10.3 mg/dL 9.4   9.6     ? ? ?Lab Results  ?Component Value Date  ? WBC 3.8 (L) 11/14/2021  ? HGB 11.6 (L) 11/14/2021  ? HCT 33.7 (L) 11/14/2021  ? MCV 97.1 11/14/2021  ? PLT 228 11/14/2021  ? NEUTROABS 1.5 (L) 01/02/2018  ? ? ?ASSESSMENT & PLAN:  ?Breast cancer of upper-outer quadrant of left female breast ([place]) ?Left lumpectomy 04/11/2016: IDC grade 3, 1.2 cm, IG DCIS, LVI present, 0/8 lymph nodes negative, ER 100%, PR 90%, HER-2 negative, Ki-67 10%, T1 cN0 stage I a pathologic stage ?  ?Adj XRT 06/18/16 to 07/30/16 ?  ?Treatment Plan: Adj Anti-estrogen therapy with Letrozole 2.5 mg daily started 08/29/2016 ?  ?Letrozole toxicities: Denies any hot flashes. Occasional lightheadedness ?   ?Breast cancer surveillance: ?1.  CT chest done 08/12/2021: Multiple bilateral lung nodules new since 2019  ?2. mammogram  scheduled for 10/18/21 ?  ?Bronchoscopy 11/14/2021 by Dr. Valeta Harms: Findings consistent with metastatic breast cancer, ER greater than 95%, GA TA 3 and GCDFP are negative ? ?Discussion: I discussed with the patient that the presence of disease outside of the breast indicates metastatic breast cancer. ?I recommended switching treatment to Verzenio with letrozole ? ?Abemaciclib counseling: I discussed at length the risks and benefits of Abemaciclib in combination with letrozole. Adverse effects of Abemaciclib include decreasing neutrophil count, pneumonia, blood clots in lungs as well as nausea and GI symptoms. Side effects of letrozole include hot flashes, muscle aches and pains, uterine bleeding/spotting/cancer, osteoporosis, risk of blood clots. ? ?Return to clinic in 3 weeks for follow-up with Jenny Reichmann ? ? ? ?No orders of the defined types were placed in this encounter. ? ?The patient has a good  understanding of the overall plan. she agrees with it. she will call with any problems that may develop before the next visit here. ?Total time spent: 30 mins including face to face time and time spent for planning, charting and co-ordination of care ? ? Harriette Ohara, MD ?12/05/21 ? ? ? I Gardiner Coins am scribing for Dr. Lindi Adie ? ?I have reviewed the above documentation for accuracy and completeness, and I agree with the above. ?  ?

## 2021-12-05 ENCOUNTER — Other Ambulatory Visit: Payer: Self-pay

## 2021-12-05 ENCOUNTER — Telehealth: Payer: Self-pay

## 2021-12-05 ENCOUNTER — Inpatient Hospital Stay (HOSPITAL_BASED_OUTPATIENT_CLINIC_OR_DEPARTMENT_OTHER): Payer: Medicare Other | Admitting: Hematology and Oncology

## 2021-12-05 ENCOUNTER — Other Ambulatory Visit (HOSPITAL_COMMUNITY): Payer: Self-pay

## 2021-12-05 DIAGNOSIS — C50412 Malignant neoplasm of upper-outer quadrant of left female breast: Secondary | ICD-10-CM

## 2021-12-05 DIAGNOSIS — Z170 Estrogen receptor positive status [ER+]: Secondary | ICD-10-CM | POA: Diagnosis not present

## 2021-12-05 DIAGNOSIS — Z79899 Other long term (current) drug therapy: Secondary | ICD-10-CM | POA: Diagnosis not present

## 2021-12-05 MED ORDER — ABEMACICLIB 100 MG PO TABS
100.0000 mg | ORAL_TABLET | Freq: Two times a day (BID) | ORAL | 0 refills | Status: DC
  Filled 2021-12-05: qty 70, 35d supply, fill #0

## 2021-12-05 MED ORDER — LETROZOLE 2.5 MG PO TABS: 2.5000 mg | ORAL_TABLET | Freq: Every day | ORAL | 3 refills | Status: DC

## 2021-12-05 NOTE — Telephone Encounter (Signed)
Oral Oncology Patient Advocate Encounter ?  ?Received notification from [facility] that prior authorization for Verzenio is required. ?  ?PA submitted on CoverMyMeds ?Key [id number] ?Status is pending ?  ?Oral Oncology Clinic will continue to follow. ? ?Wynn Maudlin CPHT ?Specialty Pharmacy Patient Advocate ?[place] ?Phone [phone] ?Fax [phone] ?12/05/2021 12:45 PM ? ?

## 2021-12-05 NOTE — Assessment & Plan Note (Signed)
Left lumpectomy 04/11/2016: IDC grade 3, 1.2 cm, IG DCIS, LVI present, 0/8 lymph nodes negative, ER 100%, PR 90%, HER-2 negative, Ki-67 10%, T1 cN0 stage I a pathologic stage ?? ?Adj XRT 06/18/16 to 07/30/16 ?? ?Treatment Plan: Adj Anti-estrogen therapy with Letrozole 2.5 mg daily?started 08/29/2016 ?? ?Letrozole toxicities:?Denies any hot flashes. Occasional lightheadedness ??? ?Breast cancer surveillance: ?1.??CT chest done 08/12/2021: Multiple bilateral lung nodules new since 2019  ?2.?mammogram??scheduled for 10/18/21 ?? ?Bronchoscopy 11/14/2021 by Dr. Valeta Harms: Findings consistent with metastatic breast cancer, ER greater than 95%, GA TA 3 and GCDFP are negative ? ?Discussion: I discussed with the patient the presence of disease outside of the breast indicates metastatic breast cancer. ?I recommended addition of Verzenio to the current treatment plan. ? ?Abemaciclib counseling: I discussed at length the risks and benefits of Abemaciclib in combination with letrozole. Adverse effects of Abemaciclib include decreasing neutrophil count, pneumonia, blood clots in lungs as well as nausea and GI symptoms. Side effects of letrozole include hot flashes, muscle aches and pains, uterine bleeding/spotting/cancer, osteoporosis, risk of blood clots. ? ?Return to clinic in 3 weeks for follow-up ?

## 2021-12-05 NOTE — Telephone Encounter (Signed)
Oral Oncology Pharmacist Encounter ? ?Received new prescription for abemaciclib (Verzenio) for the treatment of metastatic breast cancer in conjunction with letrozole, planned duration until disease progression or unacceptable toxicity. Prescription dose and frequency assessed. ? ?Labs from 11/14/21 assessed, no interventions needed. ? ?Current medication list in Epic reviewed, DDIs with verzenio identified: none ? ?Evaluated chart and no patient barriers to medication adherence noted.  ? ?Patient agreement for treatment documented in MD note on 12/05/2021. ? ?Prescription has been e-scribed to the [facility] for benefits analysis and approval. ? ?Oral Oncology Clinic will continue to follow for insurance authorization, copayment issues, initial counseling and start date. ? ?Drema Halon, PharmD ?Hematology/Oncology Clinical Pharmacist ?[facility] ?[phone]  ?12/05/2021 12:46 PM ? ? ?

## 2021-12-06 ENCOUNTER — Other Ambulatory Visit (HOSPITAL_COMMUNITY): Payer: Self-pay

## 2021-12-06 NOTE — Telephone Encounter (Signed)
Oral Oncology Patient Advocate Encounter ? ?Prior Authorization for Sheila Jacobs has been approved.   ? ?PA# [id number] ?Effective dates: 12/06/21 through 09/09/22 ? ?Patients co-pay is $0 ? ?Oral Oncology Clinic will continue to follow.  ? ?Wynn Maudlin CPHT ?Specialty Pharmacy Patient Advocate ?[place] ?Phone [phone] ?Fax [phone] ?12/06/2021 8:55 AM ? ?

## 2021-12-07 NOTE — Telephone Encounter (Signed)
Oral Chemotherapy Pharmacist Encounter ? ?I spoke with patient for overview of: Verzenio for the treatment of metastatic, hormone-receptor positive breast cancer, in combination with letrozole, planned duration until disease progression or unacceptable toxicity.  ? ?Counseled patient on administration, dosing, side effects, monitoring, drug-food interactions, safe handling, storage, and disposal. ? ?Patient will take Verzenio '100mg'$  tablets, 1 tablet by mouth twice daily without regard to food. ? ?Patient knows to avoid grapefruit and grapefruit juice. ? ?Patient is taking letrozole once daily. ? ?Verzenio start date: 12/14/2021 ? ?Adverse effects include but are not limited to: diarrhea, fatigue, nausea, abdominal pain, decreased blood counts, and increased liver function tests, and joint pains. ?Severe, life-threatening, and/or fatal interstitial lung disease (ILD) and/or pneumonitis may occur with CDK 4/6 inhibitors. ? ?Patient has anti-emetic on hand and knows to take it if nausea develops.   ?Patient will obtain anti diarrheal and alert the office of 4 or more loose stools above baseline. ? ?Reviewed with patient importance of keeping a medication schedule and plan for any missed doses. No barriers to medication adherence identified. ? ?Medication reconciliation performed and medication/allergy list updated. ? ?Ship broker for [employer] has been obtained. ?Test claim at the pharmacy revealed copayment $0 for 1st fill of 28 days. ?Patient will pick up medication from [facility] on 12/13/2021 ? ?Patient informed the pharmacy will reach out 5-7 days prior to needing next fill of Verzenio to coordinate continued medication acquisition to prevent break in therapy. ? ?All questions answered. ? ?Ms. Stemm voiced understanding and appreciation.  ? ?Medication education handout placed in mail for patient. Patient knows to call the office with questions or concerns. Oral Chemotherapy Clinic  phone number provided to patient.  ? ?Drema Halon, PharmD ?Hematology/Oncology Clinical Pharmacist ?[facility] ?[phone] ?12/12/2021   10:03 AM ? ? ?

## 2021-12-12 ENCOUNTER — Other Ambulatory Visit (HOSPITAL_COMMUNITY): Payer: Self-pay

## 2021-12-12 ENCOUNTER — Ambulatory Visit: Payer: Medicare Other | Admitting: Acute Care

## 2021-12-12 MED ORDER — ABEMACICLIB 100 MG PO TABS
100.0000 mg | ORAL_TABLET | Freq: Two times a day (BID) | ORAL | 0 refills | Status: DC
Start: 2021-12-12 — End: 2022-11-25
  Filled 2021-12-12 (×2): qty 56, 28d supply, fill #0

## 2021-12-13 ENCOUNTER — Other Ambulatory Visit (HOSPITAL_COMMUNITY): Payer: Self-pay

## 2021-12-19 ENCOUNTER — Ambulatory Visit: Payer: Medicare Other | Admitting: Primary Care

## 2021-12-19 NOTE — Progress Notes (Deleted)
? ?'@Patient'$  ID: Sheila Jacobs, female    DOB: 1951-06-18, 71 y.o.   MRN: [id number] ? ?No chief complaint on file. ? ? ?Referring provider: ?Arthur Holms, NP ? ?HPI: ? ? ? ? ?12/19/2021 ?Pathology consistent with metastatic breast carcinoma ?Ensure follow up with Dr. Lindi Adie has been scheduled, 3/28  ?Seeing oncology 4/9 ?MRI has been scheduled  ? ? ?Allergies  ?Allergen Reactions  ? Cortisone Rash  ? Effexor [Venlafaxine] Palpitations  ? Lexapro [Escitalopram] Palpitations  ?  Heart racing  ? Xarelto [Rivaroxaban] Rash  ? ? ?Immunization History  ?Administered Date(s) Administered  ? Influenza, High Dose Seasonal PF 08/10/2015, 08/09/2016, 06/17/2018  ? Influenza-Unspecified 06/10/2020  ? Pneumococcal Conjugate-13 08/10/2015  ? Pneumococcal Polysaccharide-23 08/21/2018  ? Tdap 11/08/2011  ? ? ?Past Medical History:  ?Diagnosis Date  ? Acute saddle pulmonary embolism without acute cor pulmonale ([place]) 12/29/2017  ? Anginal pain ([place])   ? No angiographic evidence of CAD by cath 10/21/14  ? Arthritis   ? Breast cancer of upper-outer quadrant of left female breast ([place]) 01/31/2016  ? Colon polyps   ? GERD (gastroesophageal reflux disease)   ? Hearing loss   ? bilateral  ? History of radiation therapy 10/9-11/20/17  ? Left Breast 45 Gy in 25 fractions, Left Breast Boost 6 Gy in 3 fractions.   ? Hyperlipidemia   ? Hypertension   ? Morbid obesity ([place])   ? OSA (obstructive sleep apnea) 01/23/2018  ? uses cpap  ? Palpitations 10/16/2016  ? Personal history of noncompliance with medical treatment, presenting hazards to health 08/27/2016  ? Personal history of radiation therapy   ? Thyroid goiter   ? ? ?Tobacco History: ?Social History  ? ?Tobacco Use  ?Smoking Status Former  ? Packs/day: 1.00  ? Years: 45.00  ? Pack years: 45.00  ? Types: Cigarettes  ? Quit date: 07/11/2014  ? Years since quitting: 7.4  ?Smokeless Tobacco Never  ? ?Counseling given: Not Answered ? ? ?Outpatient Medications Prior to Visit  ?Medication Sig  Dispense Refill  ? abemaciclib (VERZENIO) 100 MG tablet Take 1 tablet (100 mg total) by mouth 2 (two) times daily. Swallow tablets whole. Do not chew, crush, or split tablets before swallowing. 56 tablet 0  ? acetaminophen (TYLENOL) 500 MG tablet Take 500 mg by mouth every 6 (six) hours as needed for moderate pain or headache.    ? Cholecalciferol (VITAMIN D) 125 MCG (5000 UT) CAPS Take 5,000 Units by mouth daily.    ? ELIQUIS 5 MG TABS tablet TAKE 1 TABLET BY MOUTH TWICE A DAY 180 tablet 1  ? Ferrous Gluconate (IRON 27 PO) Take 1 tablet by mouth 2 (two) times daily.    ? gabapentin (NEURONTIN) 100 MG capsule Take 100 mg by mouth at bedtime.    ? hydrochlorothiazide (HYDRODIURIL) 25 MG tablet Take 1 tablet (25 mg total) by mouth daily. 90 tablet 3  ? letrozole (FEMARA) 2.5 MG tablet Take 1 tablet (2.5 mg total) by mouth daily. 90 tablet 3  ? levothyroxine (SYNTHROID) 137 MCG tablet TAKE 1 TABLET BY MOUTH DAILY BEFORE BREAKFAST. 90 tablet 2  ? metoprolol succinate (TOPROL-XL) 50 MG 24 hr tablet Take 50 mg by mouth daily.    ? nitroGLYCERIN (NITROSTAT) 0.4 MG SL tablet 0.4 mg every 5 (five) minutes as needed for chest pain.    ? omeprazole (PRILOSEC) 20 MG capsule Take 20 mg by mouth daily.    ? triamcinolone cream (KENALOG) 0.1 % 1 application daily  as needed (Rash).    ? vitamin B-12 (CYANOCOBALAMIN) 1000 MCG tablet Take 1,000 mcg by mouth daily.    ? ?No facility-administered medications prior to visit.  ? ? ? ? ?Review of Systems ? ?Review of Systems ? ? ?Physical Exam ? ?There were no vitals taken for this visit. ?Physical Exam  ? ?Lab Results: ? ?CBC ?   ?Component Value Date/Time  ? WBC 3.8 (L) 11/14/2021 1138  ? RBC 3.47 (L) 11/14/2021 1138  ? HGB 11.6 (L) 11/14/2021 1138  ? HGB 12.4 10/22/2018 1030  ? HGB 11.2 (L) 08/29/2016 0856  ? HCT 33.7 (L) 11/14/2021 1138  ? HCT 35.2 10/22/2018 1030  ? HCT 33.9 (L) 08/29/2016 0856  ? PLT 228 11/14/2021 1138  ? PLT 227 10/22/2018 1030  ? MCV 97.1 11/14/2021 1138  ? MCV  93 10/22/2018 1030  ? MCV 95.0 08/29/2016 0856  ? MCH 33.4 11/14/2021 1138  ? MCHC 34.4 11/14/2021 1138  ? RDW 12.6 11/14/2021 1138  ? RDW 12.2 10/22/2018 1030  ? RDW 12.6 08/29/2016 0856  ? LYMPHSABS 1.1 01/02/2018 2057  ? LYMPHSABS 1.3 08/29/2016 0856  ? MONOABS 0.4 01/02/2018 2057  ? MONOABS 0.3 08/29/2016 0856  ? EOSABS 0.2 01/02/2018 2057  ? EOSABS 0.1 08/29/2016 0856  ? BASOSABS 0.0 01/02/2018 2057  ? BASOSABS 0.0 08/29/2016 0856  ? ? ?BMET ?   ?Component Value Date/Time  ? NA 136 11/14/2021 1138  ? NA 138 09/09/2018 1123  ? NA 139 08/29/2016 0856  ? K 3.6 11/14/2021 1138  ? K 4.0 08/29/2016 0856  ? CL 103 11/14/2021 1138  ? CO2 25 11/14/2021 1138  ? CO2 26 08/29/2016 0856  ? GLUCOSE 114 (H) 11/14/2021 1138  ? GLUCOSE 108 08/29/2016 0856  ? BUN 10 11/14/2021 1138  ? BUN 11 09/09/2018 1123  ? BUN 9.4 08/29/2016 0856  ? CREATININE 0.79 11/14/2021 1138  ? CREATININE 0.96 11/25/2017 1026  ? CREATININE 0.8 08/29/2016 0856  ? CALCIUM 9.4 11/14/2021 1138  ? CALCIUM 9.3 08/29/2016 0856  ? GFRNONAA >60 11/14/2021 1138  ? GFRNONAA 59 (L) 11/25/2017 1026  ? GFRAA 81 09/09/2018 1123  ? GFRAA >60 11/25/2017 1026  ? ? ?BNP ?   ?Component Value Date/Time  ? BNP 91.2 08/11/2021 1649  ? BNP 85.3 07/31/2016 1200  ? ? ?ProBNP ?   ?Component Value Date/Time  ? PROBNP 28.2 08/20/2014 1112  ? ? ?Imaging: ?MR Brain W Wo Contrast ? ?Result Date: 12/01/2021 ?CLINICAL DATA:  Metastatic disease evaluation EXAM: MRI HEAD WITHOUT AND WITH CONTRAST TECHNIQUE: Multiplanar, multiecho pulse sequences of the brain and surrounding structures were obtained without and with intravenous contrast. CONTRAST:  39m GADAVIST GADOBUTROL 1 MMOL/ML IV SOLN COMPARISON:  MRI 03/15/2016 without report. FINDINGS: Brain: No acute infarction, hemorrhage, hydrocephalus, extra-axial collection or mass lesion. Tiny focus of possible enhancement in the right frontal white matter (series 17, image 90), which may have subtle T1 hyperintense correlate (series 12,  image 90). Multiple remote lacunar infarcts in bilateral cerebellar hemispheres. Mild scattered T2/FLAIR hyperintensities in the white matter, nonspecific but compatible with chronic microvascular ischemic disease that is mild for age. Vascular: Major arterial flow voids are maintained skull base. Skull and upper cervical spine: Incompletely imaged focus of T1 hypointensity in the right eccentric C2 vertebral body (series 7, image 12). Sinuses/Orbits: Mild paranasal sinus mucosal thickening. No acute orbital findings. Other: Small left mastoid effusion. IMPRESSION: 1. Tiny focus of possible enhancement in the right frontal white matter. Favor  a benign etiology, but recommend 3-4 week follow-up MRI with contrast to ensure stability and/or resolution and exclude metastatic disease. 2. Incompletely imaged focus of T1 hypointensity in the right eccentric C2 vertebral body. This may be degenerative; however, osseous metastatic disease is not excluded. Recommend attention on the recommended follow-up. 3. Multiple remote lacunar infarcts in bilateral cerebellar hemispheres. Electronically Signed   By: Margaretha Sheffield M.D.   On: 12/01/2021 13:24   ? ? ?Assessment & Plan:  ? ?No problem-specific Assessment & Plan notes found for this encounter. ? ? ? ? ?Martyn Ehrich, NP ?12/19/2021 ? ?

## 2021-12-22 ENCOUNTER — Other Ambulatory Visit: Payer: Self-pay

## 2021-12-22 DIAGNOSIS — Z170 Estrogen receptor positive status [ER+]: Secondary | ICD-10-CM

## 2021-12-26 ENCOUNTER — Inpatient Hospital Stay: Payer: Medicare Other | Admitting: Pharmacist

## 2021-12-26 ENCOUNTER — Inpatient Hospital Stay: Payer: Medicare Other

## 2021-12-26 ENCOUNTER — Telehealth: Payer: Self-pay | Admitting: Pharmacist

## 2021-12-26 NOTE — Telephone Encounter (Signed)
Called pt to schedule per 4/17 inbasket, pt stated she wanted to attend 4/19 appt with Dr.Walsh before r/s with Pharmacy Clinic, Jenny Reichmann and [facility] notified.  ?

## 2021-12-27 ENCOUNTER — Encounter: Payer: Self-pay | Admitting: Primary Care

## 2021-12-27 ENCOUNTER — Ambulatory Visit (INDEPENDENT_AMBULATORY_CARE_PROVIDER_SITE_OTHER): Payer: Medicare Other | Admitting: Primary Care

## 2021-12-27 DIAGNOSIS — R918 Other nonspecific abnormal finding of lung field: Secondary | ICD-10-CM

## 2021-12-27 NOTE — Progress Notes (Signed)
$'@Patient'k$  ID: Sheila [facility], female    DOB: May 04, 1951, 71 y.o.   MRN: [id number]  Chief Complaint  Patient presents with   Follow-up    Biopsy Results-occass. Dry cough    Referring provider: Arthur Holms, NP  HPI: 71 year old female, former smoker quit in 2015 (45 pack year hx). Patient of Dr. Valeta Harms.   Previous [facility] pulmonary encounter: 12/27/2021 She has an occ dry cough. She tells me multiple times that that she does not have breast cancer and has never had it. She thinks spots on her lungs are because of the blood thinner that she is on. Breast screen on 10/18/21 showed no mammographic evidence of malignancy. She had a bronchoscopy on 11/14/21 with Dr. Valeta Harms showed pathology consistent with metastatic breast carcinoma. She saw Dr. Lindi Adie on 12/05/21 and he suggested switching treatment to [place] with letrozole. Verzenio has been approved. Needs apt with pharmacy clinic.   MRI brain showed tiny focus of possible enhancement in the right frontal white matter. Favored a benign etiology but recommend 3-4 week follow-up MRI with contrast Incompletely imaged focus of TA hypodensity in right eccentric C12 vertebral body which may be degenerative or osseous metastatic disease. Needs attention on follow-up.    Breast cancer surveillance: 1. CT chest done 08/12/2021: Multiple bilateral lung nodules new since 2019  2. Mammogram 10/18/21 showed no mammographic evidence of malignancy.  3. Bronchoscopy 11/14/2021 by Dr. Valeta Harms: Findings consistent with metastatic breast cancer, ER greater than 95%, GA TA 3 and GCDFP are negative     Allergies  Allergen Reactions   Cortisone Rash   Effexor [Venlafaxine] Palpitations   Lexapro [Escitalopram] Palpitations    Heart racing   Xarelto [Rivaroxaban] Rash    Immunization History  Administered Date(s) Administered   Influenza, High Dose Seasonal PF 08/10/2015, 08/09/2016, 06/17/2018   Influenza-Unspecified 06/10/2020   Pneumococcal Conjugate-13  08/10/2015   Pneumococcal Polysaccharide-23 08/21/2018   Tdap 11/08/2011    Past Medical History:  Diagnosis Date   Acute saddle pulmonary embolism without acute cor pulmonale (HCC) 12/29/2017   Anginal pain (HCC)    No angiographic evidence of CAD by cath 10/21/14   Arthritis    Breast cancer of upper-outer quadrant of left female breast ([facility]) 01/31/2016   Colon polyps    GERD (gastroesophageal reflux disease)    Hearing loss    bilateral   History of radiation therapy 10/9-11/20/17   Left Breast 45 Gy in 25 fractions, Left Breast Boost 6 Gy in 3 fractions.    Hyperlipidemia    Hypertension    Morbid obesity (HCC)    OSA (obstructive sleep apnea) 01/23/2018   uses cpap   Palpitations 10/16/2016   Personal history of noncompliance with medical treatment, presenting hazards to health 08/27/2016   Personal history of radiation therapy    Thyroid goiter     Tobacco History: Social History   Tobacco Use  Smoking Status Former   Packs/day: 1.00   Years: 45.00   Pack years: 45.00   Types: Cigarettes   Quit date: 07/11/2014   Years since quitting: 7.5  Smokeless Tobacco Never   Counseling given: Not Answered   Outpatient Medications Prior to Visit  Medication Sig Dispense Refill   abemaciclib (VERZENIO) 100 MG tablet Take 1 tablet (100 mg total) by mouth 2 (two) times daily. Swallow tablets whole. Do not chew, crush, or split tablets before swallowing. 56 tablet 0   acetaminophen (TYLENOL) 500 MG tablet Take 500 mg by mouth every  6 (six) hours as needed for moderate pain or headache.     Cholecalciferol (VITAMIN D) 125 MCG (5000 UT) CAPS Take 5,000 Units by mouth daily.     ELIQUIS 5 MG TABS tablet TAKE 1 TABLET BY MOUTH TWICE A DAY 180 tablet 1   Ferrous Gluconate (IRON 27 PO) Take 1 tablet by mouth 2 (two) times daily.     gabapentin (NEURONTIN) 100 MG capsule Take 100 mg by mouth at bedtime.     hydrochlorothiazide (HYDRODIURIL) 25 MG tablet Take 1 tablet (25 mg total)  by mouth daily. 90 tablet 3   letrozole (FEMARA) 2.5 MG tablet Take 1 tablet (2.5 mg total) by mouth daily. 90 tablet 3   levothyroxine (SYNTHROID) 137 MCG tablet TAKE 1 TABLET BY MOUTH DAILY BEFORE BREAKFAST. 90 tablet 2   metoprolol succinate (TOPROL-XL) 50 MG 24 hr tablet Take 50 mg by mouth daily.     nitroGLYCERIN (NITROSTAT) 0.4 MG SL tablet 0.4 mg every 5 (five) minutes as needed for chest pain.     omeprazole (PRILOSEC) 20 MG capsule Take 20 mg by mouth daily.     triamcinolone cream (KENALOG) 0.1 % 1 application daily as needed (Rash).     vitamin B-12 (CYANOCOBALAMIN) 1000 MCG tablet Take 1,000 mcg by mouth daily.     No facility-administered medications prior to visit.   Review of Systems  Review of Systems  Constitutional: Negative.     Physical Exam  BP 130/64 (BP Location: Right Arm, Cuff Size: Large)   Pulse 68   Temp 98.3 F (36.8 C) (Temporal)   Ht '5\' 2"'$  (1.575 m)   Wt 297 lb 9.6 oz (135 kg)   SpO2 98%   BMI 54.43 kg/m  Physical Exam Constitutional:      Appearance: Normal appearance. She is obese.  HENT:     Head: Normocephalic and atraumatic.  Cardiovascular:     Rate and Rhythm: Normal rate and regular rhythm.  Pulmonary:     Effort: Pulmonary effort is normal.     Breath sounds: Normal breath sounds.  Skin:    General: Skin is warm and dry.  Neurological:     General: No focal deficit present.     Mental Status: She is alert and oriented to person, place, and time. Mental status is at baseline.  Psychiatric:        Mood and Affect: Mood normal.        Behavior: Behavior normal.        Thought Content: Thought content normal.        Judgment: Judgment normal.     Lab Results:  CBC    Component Value Date/Time   WBC 3.8 (L) 11/14/2021 1138   RBC 3.47 (L) 11/14/2021 1138   HGB 11.6 (L) 11/14/2021 1138   HGB 12.4 10/22/2018 1030   HGB 11.2 (L) 08/29/2016 0856   HCT 33.7 (L) 11/14/2021 1138   HCT 35.2 10/22/2018 1030   HCT 33.9 (L)  08/29/2016 0856   PLT 228 11/14/2021 1138   PLT 227 10/22/2018 1030   MCV 97.1 11/14/2021 1138   MCV 93 10/22/2018 1030   MCV 95.0 08/29/2016 0856   MCH 33.4 11/14/2021 1138   MCHC 34.4 11/14/2021 1138   RDW 12.6 11/14/2021 1138   RDW 12.2 10/22/2018 1030   RDW 12.6 08/29/2016 0856   LYMPHSABS 1.1 01/02/2018 2057   LYMPHSABS 1.3 08/29/2016 0856   MONOABS 0.4 01/02/2018 2057   MONOABS 0.3 08/29/2016 0856  EOSABS 0.2 01/02/2018 2057   EOSABS 0.1 08/29/2016 0856   BASOSABS 0.0 01/02/2018 2057   BASOSABS 0.0 08/29/2016 0856    BMET    Component Value Date/Time   NA 136 11/14/2021 1138   NA 138 09/09/2018 1123   NA 139 08/29/2016 0856   K 3.6 11/14/2021 1138   K 4.0 08/29/2016 0856   CL 103 11/14/2021 1138   CO2 25 11/14/2021 1138   CO2 26 08/29/2016 0856   GLUCOSE 114 (H) 11/14/2021 1138   GLUCOSE 108 08/29/2016 0856   BUN 10 11/14/2021 1138   BUN 11 09/09/2018 1123   BUN 9.4 08/29/2016 0856   CREATININE 0.79 11/14/2021 1138   CREATININE 0.96 11/25/2017 1026   CREATININE 0.8 08/29/2016 0856   CALCIUM 9.4 11/14/2021 1138   CALCIUM 9.3 08/29/2016 0856   GFRNONAA >60 11/14/2021 1138   GFRNONAA 59 (L) 11/25/2017 1026   GFRAA 81 09/09/2018 1123   GFRAA >60 11/25/2017 1026    BNP    Component Value Date/Time   BNP 91.2 08/11/2021 1649   BNP 85.3 07/31/2016 1200    ProBNP    Component Value Date/Time   PROBNP 28.2 08/20/2014 1112    Imaging: No results found.   Assessment & Plan:   Multiple pulmonary nodules - Former smoker, hx breast cancer s/p lumpectomy in August 2017. CT chest done 08/12/2021: Multiple bilateral lung nodules new since 2019.  PET scan in January 2023 showed hypermetabolic activity left axillary nodule, compatible with nodal recurrence. Bronchoscopy on 11/14/21 with Dr. Valeta Harms showed pathology consistent with metastatic breast carcinoma. She saw Dr. Lindi Adie on 3/28 and he suggested switching treatment to [place] with letrozole. Verzenio has  been approved. Needs apt with pharmacy clinic. These above results were discussed with patient at length, she is somewhat resistant to accept this information. Strongly advised she follow back up with Dr. Lindi Adie with Oncology in 1-2 weeks to discuss treatment plan.   40 mins spent on case; > 50% face to face with patient   Martyn Ehrich, NP 01/29/2022

## 2021-12-27 NOTE — Patient Instructions (Addendum)
S/p left breast lumpectomy. PET scan in January 2023 showed hypermetabolic activity left axillary nodule, compatible with nodal recurrence ? ?Bronchoscopy in March 2023 with Dr. Valeta Jacobs pathology consistent with metastatic breast carcinoma ? ?You need to follow back up with Dr. Lindi Jacobs with Oncology in 1-2 weeks (this is urgent, to discuss medication) ? ?You can take Delsym cough syrup every 12 hours as needed for cough  ? ?Follow-up: ?4 months with Dr. Valeta Jacobs or sooner if needed  ?

## 2021-12-28 ENCOUNTER — Other Ambulatory Visit (HOSPITAL_COMMUNITY): Payer: Self-pay

## 2022-01-01 ENCOUNTER — Other Ambulatory Visit (HOSPITAL_COMMUNITY): Payer: Self-pay

## 2022-01-03 ENCOUNTER — Other Ambulatory Visit (HOSPITAL_COMMUNITY): Payer: Self-pay

## 2022-01-29 ENCOUNTER — Telehealth: Payer: Self-pay | Admitting: Primary Care

## 2022-01-29 NOTE — Telephone Encounter (Signed)
Attempted to call pt but unable to reach. Unable to leave VM as mailbox is full. Will try to call back later. 

## 2022-01-29 NOTE — Telephone Encounter (Signed)
Please call patient and ensure she follows up with Dr. Lindi Adie with oncology/hematology

## 2022-01-29 NOTE — Assessment & Plan Note (Addendum)
-   Former smoker, hx breast cancer s/p lumpectomy in August 2017. CT chest done 08/12/2021: Multiple bilateral lung nodules new since 2019.  PET scan in January 2023 showed hypermetabolic activity left axillary nodule, compatible with nodal recurrence. Bronchoscopy on 11/14/21 with Dr. Valeta Harms showed pathology consistent with metastatic breast carcinoma. She saw Dr. Lindi Adie on 3/28 and he suggested switching treatment to [place] with letrozole. Verzenio has been approved. Needs apt with pharmacy clinic. These above results were discussed with patient at length, she is somewhat resistant to accept this information. Strongly advised she follow back up with Dr. Lindi Adie with Oncology in 1-2 weeks to discuss treatment plan.

## 2022-01-31 NOTE — Telephone Encounter (Signed)
Attempted to call pt but unable to reach and unable to leave VM as mailbox is full. Due to multiple attempts trying to reach pt and unable to do so, per protocol encounter has been closed.

## 2022-01-31 NOTE — Telephone Encounter (Signed)
Patient returned call and I was able to speak with her.  She stated he has had a fu/ with Dr. Lindi Adie with oncology/hematology.  Nothing further needed.

## 2022-02-09 ENCOUNTER — Ambulatory Visit (HOSPITAL_COMMUNITY)
Admission: EM | Admit: 2022-02-09 | Discharge: 2022-02-09 | Disposition: A | Discharge status: Other Acute Inpatient Hospital | Payer: Medicare Other | Attending: Internal Medicine | Admitting: Internal Medicine

## 2022-02-09 ENCOUNTER — Encounter (HOSPITAL_COMMUNITY): Payer: Self-pay | Admitting: *Deleted

## 2022-02-09 ENCOUNTER — Emergency Department (HOSPITAL_COMMUNITY): Payer: Medicare Other

## 2022-02-09 ENCOUNTER — Other Ambulatory Visit: Payer: Self-pay

## 2022-02-09 ENCOUNTER — Emergency Department (HOSPITAL_COMMUNITY)
Admission: EM | Admit: 2022-02-09 | Discharge: 2022-02-10 | Disposition: A | Discharge status: Home / Self Care | Payer: Medicare Other | Attending: Emergency Medicine | Admitting: Emergency Medicine

## 2022-02-09 ENCOUNTER — Encounter (HOSPITAL_COMMUNITY): Payer: Self-pay

## 2022-02-09 DIAGNOSIS — I10 Essential (primary) hypertension: Secondary | ICD-10-CM | POA: Insufficient documentation

## 2022-02-09 DIAGNOSIS — H9190 Unspecified hearing loss, unspecified ear: Secondary | ICD-10-CM

## 2022-02-09 DIAGNOSIS — Z853 Personal history of malignant neoplasm of breast: Secondary | ICD-10-CM | POA: Diagnosis not present

## 2022-02-09 DIAGNOSIS — Z7901 Long term (current) use of anticoagulants: Secondary | ICD-10-CM | POA: Insufficient documentation

## 2022-02-09 DIAGNOSIS — R600 Localized edema: Secondary | ICD-10-CM | POA: Diagnosis not present

## 2022-02-09 DIAGNOSIS — R519 Headache, unspecified: Secondary | ICD-10-CM | POA: Insufficient documentation

## 2022-02-09 DIAGNOSIS — Z79899 Other long term (current) drug therapy: Secondary | ICD-10-CM | POA: Insufficient documentation

## 2022-02-09 DIAGNOSIS — R42 Dizziness and giddiness: Secondary | ICD-10-CM

## 2022-02-09 HISTORY — DX: Unspecified hearing loss, unspecified ear: H91.90

## 2022-02-09 LAB — BASIC METABOLIC PANEL
Anion gap: 7 (ref 5–15)
BUN: 9 mg/dL (ref 8–23)
CO2: 27 mmol/L (ref 22–32)
Calcium: 9.8 mg/dL (ref 8.9–10.3)
Chloride: 105 mmol/L (ref 98–111)
Creatinine, Ser: 0.88 mg/dL (ref 0.44–1.00)
GFR, Estimated: >60 mL/min (ref >60)
Glucose, Bld: 111 mg/dL — ABNORMAL HIGH (ref 70–99)
Potassium: 3.5 mmol/L (ref 3.5–5.1)
Sodium: 139 mmol/L (ref 135–145)

## 2022-02-09 LAB — HEPATIC FUNCTION PANEL
ALT: 13 U/L (ref 0–44)
AST: 19 U/L (ref 15–41)
Albumin: 3.4 g/dL — ABNORMAL LOW (ref 3.5–5.0)
Alkaline Phosphatase: 60 U/L (ref 38–126)
Bilirubin, Direct: 0.2 mg/dL (ref 0.0–0.2)
Indirect Bilirubin: 0.4 mg/dL (ref 0.3–0.9)
Total Bilirubin: 0.6 mg/dL (ref 0.3–1.2)
Total Protein: 7.3 g/dL (ref 6.5–8.1)

## 2022-02-09 LAB — CBC
HCT: 36.6 % (ref 36.0–46.0)
Hemoglobin: 11.9 g/dL — ABNORMAL LOW (ref 12.0–15.0)
MCH: 31.6 pg (ref 26.0–34.0)
MCHC: 32.5 g/dL (ref 30.0–36.0)
MCV: 97.3 fL (ref 80.0–100.0)
Platelets: 243 10*3/uL (ref 150–400)
RBC: 3.76 MIL/uL — ABNORMAL LOW (ref 3.87–5.11)
RDW: 12.4 % (ref 11.5–15.5)
WBC: 3.8 10*3/uL — ABNORMAL LOW (ref 4.0–10.5)
nRBC: 0 % (ref 0.0–0.2)

## 2022-02-09 LAB — CBG MONITORING, ED: Glucose-Capillary: 69 mg/dL — ABNORMAL LOW (ref 70–99)

## 2022-02-09 IMAGING — MR MR HEAD W/O CM
5 of 8 series · 26 of 48 positions shown · non-contrast
Comparison: Prior CT from earlier the same day as well as recent
MRI from [DATE].

CLINICAL DATA: Initial evaluation for acute headache.

EXAM:
MRI HEAD WITHOUT CONTRAST
TECHNIQUE: Multiplanar, multiecho pulse sequences of the brain and surrounding
structures were obtained without intravenous contrast.

[Series 2: DWI · axial · 3.0mm · 0.94mm/px · z∈[-79,+61]mm · 8 of 98 slices shown (1 of 2)]
[im 1/98]
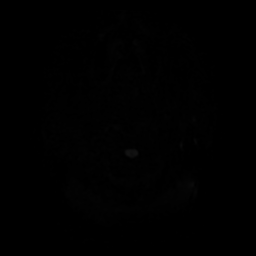
[im 11/98]
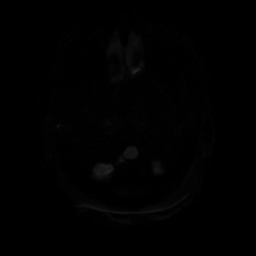
[im 33/98]
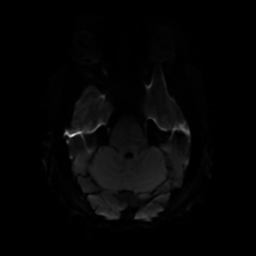
[im 44/98]
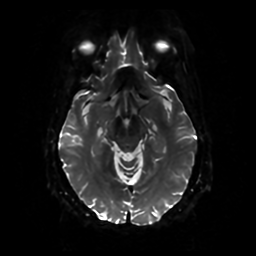
[im 54/98]
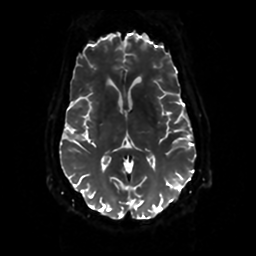
[im 65/98]
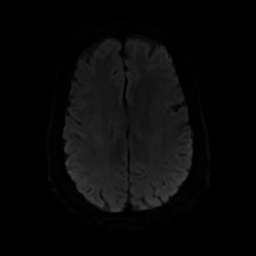
[im 87/98]
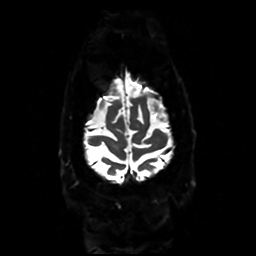
[im 98/98]
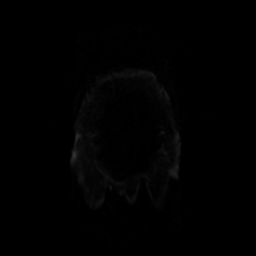

[Series 3: DWI · coronal · 4.0mm · 0.94mm/px · 8 of 76 slices shown (2 of 2)]
[im 1/76]
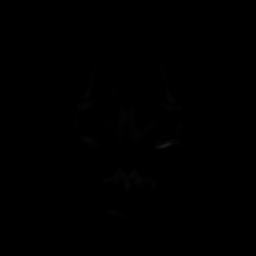
[im 11/76]
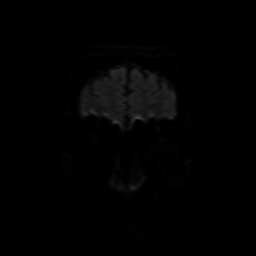
[im 22/76]
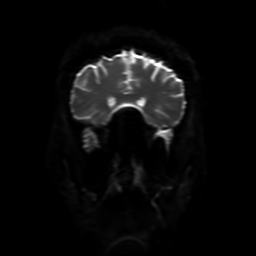
[im 33/76]
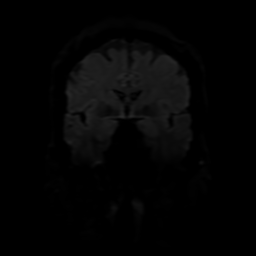
[im 43/76]
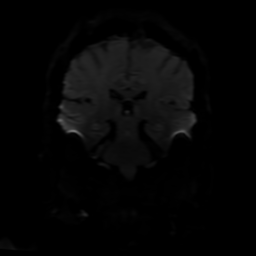
[im 54/76]
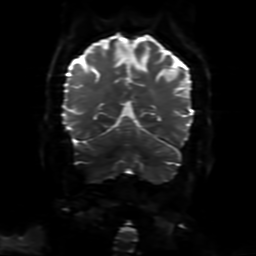
[im 65/76]
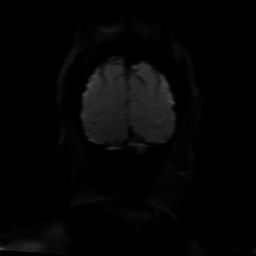
[im 76/76]
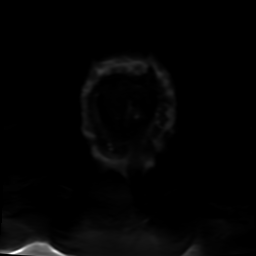

[Series 4: FLAIR · sagittal · 5.0mm · 0.23mm/px · 2 of 24 slices shown (1 of 2)]
[im 1/24]
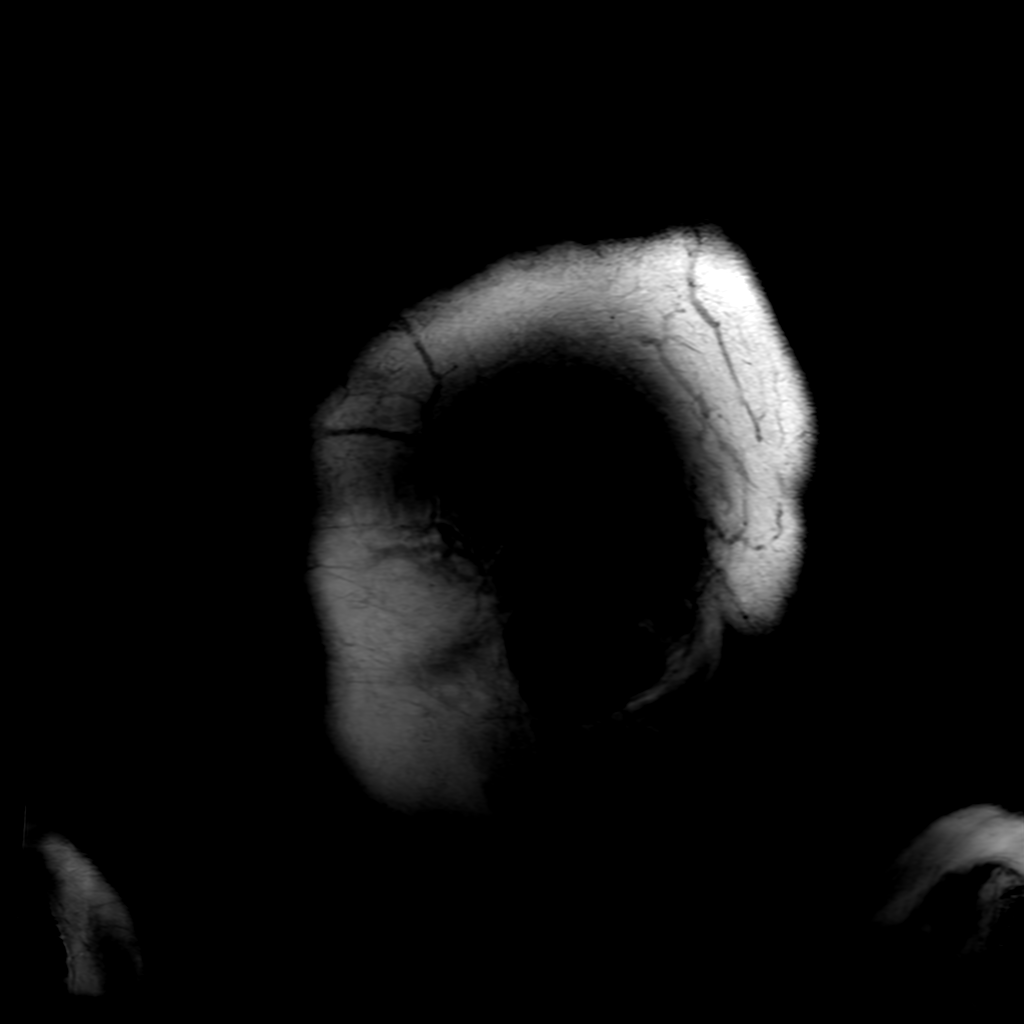
[im 24/24]
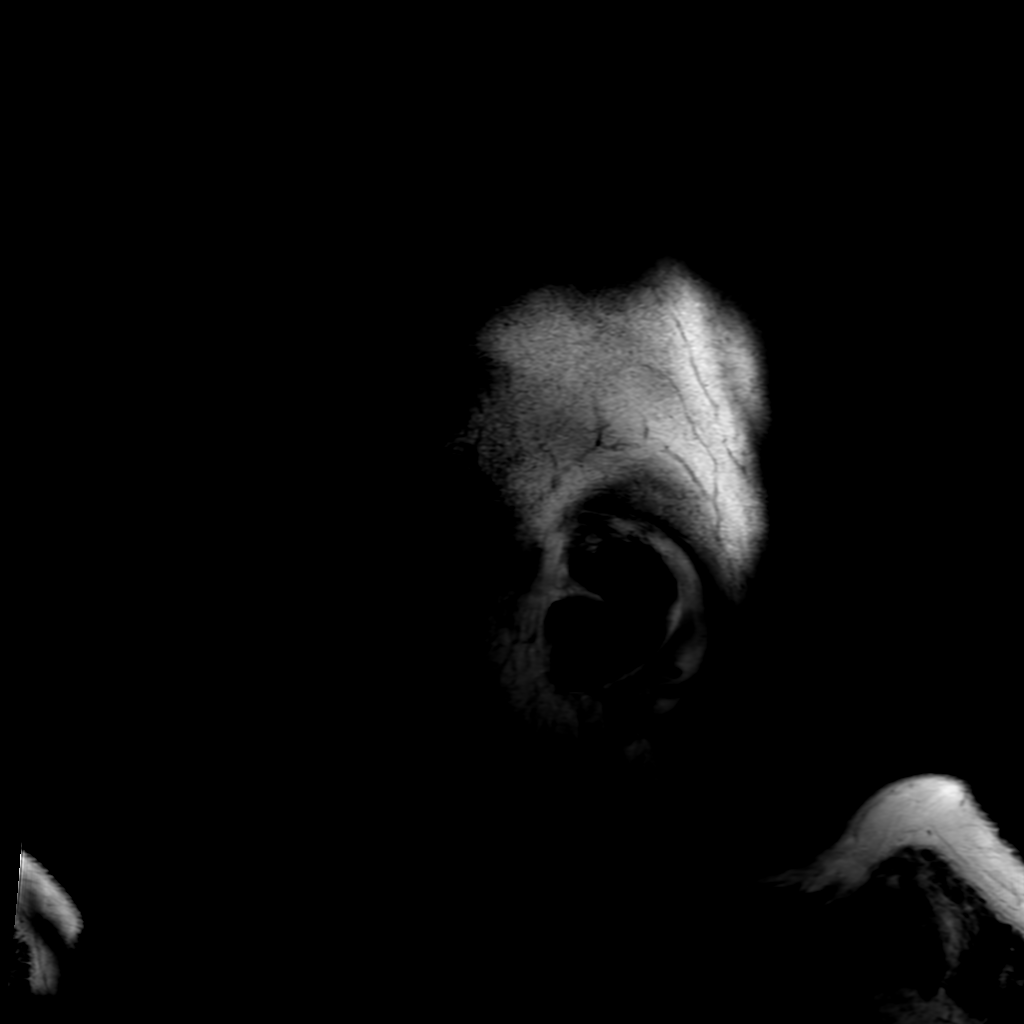

[Series 6: FLAIR · axial · 4.0mm · 0.45mm/px · z∈[-70,+76]mm · 4 of 35 slices shown (2 of 2)]
[im 1/35]
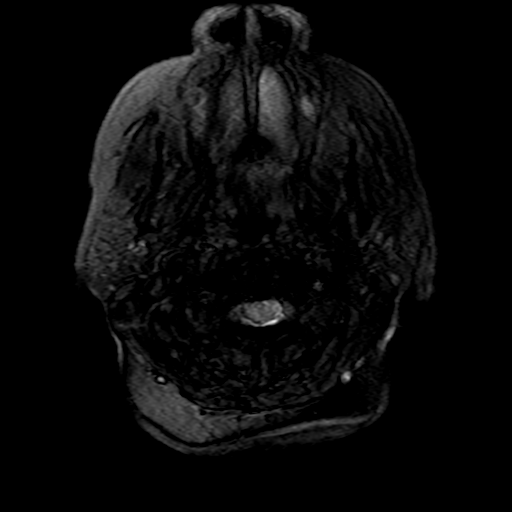
[im 12/35]
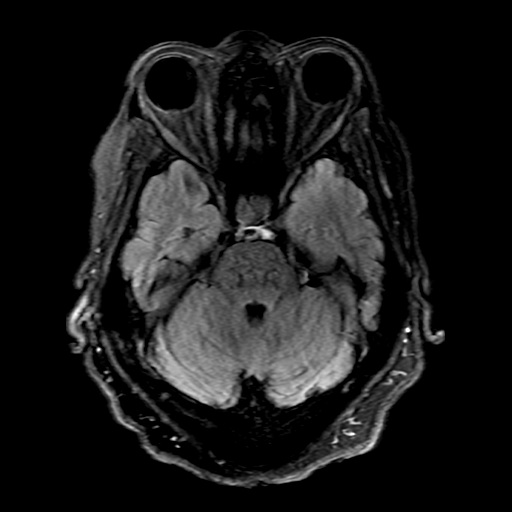
[im 23/35]
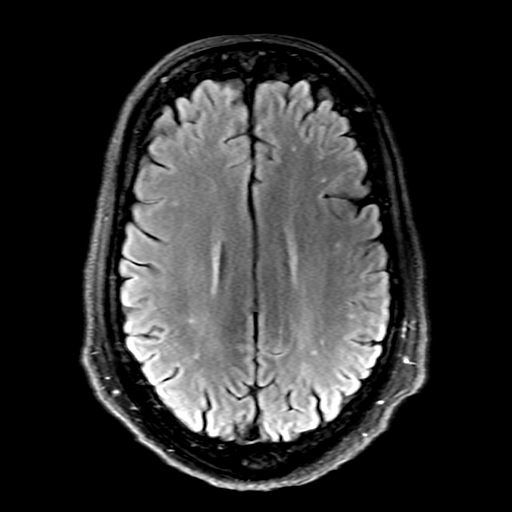
[im 35/35]
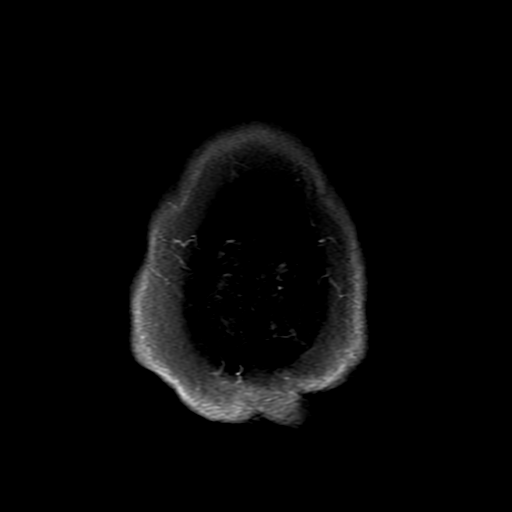

[Series 350: ADC · coronal · 4.0mm · 0.94mm/px · 4 of 37 slices shown]
[im 1/37]
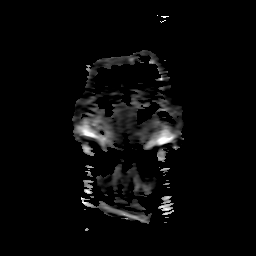
[im 13/37]
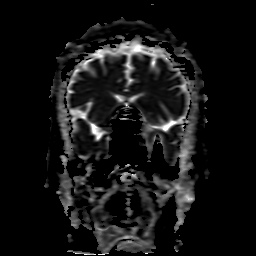
[im 25/37]
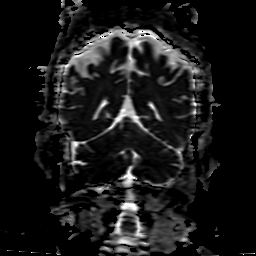
[im 37/37]
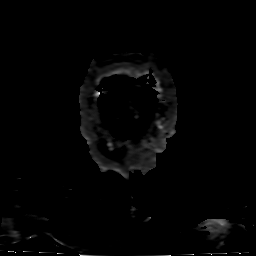

[26 of 48 positions shown; findings below may reference images not displayed]

FINDINGS: Brain: Examination moderately degraded by motion artifact.

Cerebral volume within normal limits. Scattered T2/FLAIR
hyperintensity seen involving the deep and subcortical white matter
both cerebral hemispheres, nonspecific, but overall mild in nature,
and relatively similar to previous. Few scattered remote bilateral
cerebellar infarcts noted, left greater than right.

No acute or subacute ischemic infarct. No other areas of chronic
cortical infarction. No visible acute or chronic intracranial blood
products.

No mass lesion, midline shift or mass effect. No hydrocephalus or
extra-axial fluid collection. Specifically, no discrete lesion seen
at the region of previously question small punctate focus of
enhancement within the right frontal lobe. Pituitary gland
suprasellar region within normal limits.

Vascular: Major intracranial vascular flow voids are maintained.

Skull and upper cervical spine: Craniocervical junction within
normal limits. Bone marrow signal intensity normal. Degenerative
spondylosis noted at the inferior endplate of C2. 6 mm T1
hypointense lesion noted within the C3 vertebral body (series 4,
image 14), indeterminate. This is not clearly seen on prior MRI. No
other focal marrow replacing lesion. No scalp soft tissue
abnormality.

Sinuses/Orbits: Prior bilateral ocular lens replacement. Paranasal
sinuses are largely clear. Trace bilateral mastoid effusions, of
doubtful significance.

Other: None.
IMPRESSION: 1. No acute intracranial abnormality.
2. Few scattered remote bilateral cerebellar infarcts, left greater
than right.
3. 6 mm T1 hypointense lesion within the C3 vertebral body,
indeterminate. Attention at follow-up recommended.

## 2022-02-09 IMAGING — CT CT HEAD W/O CM
4 series · 17 of 47 positions shown, 19 images · non-contrast
Comparison: [DATE].

CLINICAL DATA: Dizziness.  Recently started on a cancer medication.



[Series 3: head without · axial · non-contrast · 0.47mm/px · z∈[-107,+28]mm · 7 of 37 slices shown, 9 images]
[im 5/37  brain]
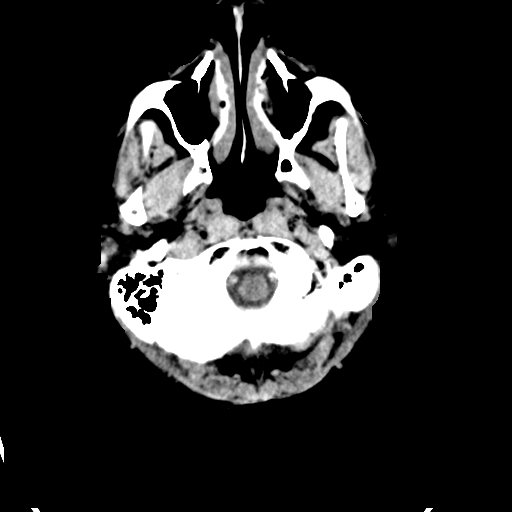
[im 5/37  bone]
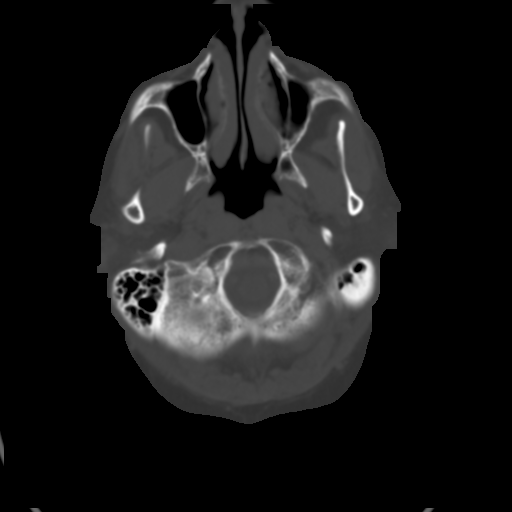
[im 10/37  brain]
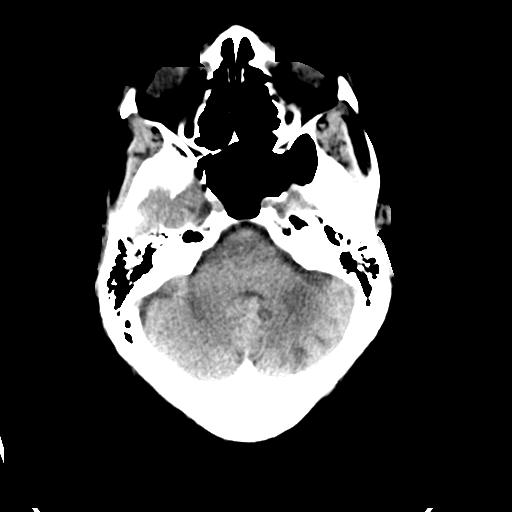
[im 14/37  brain]
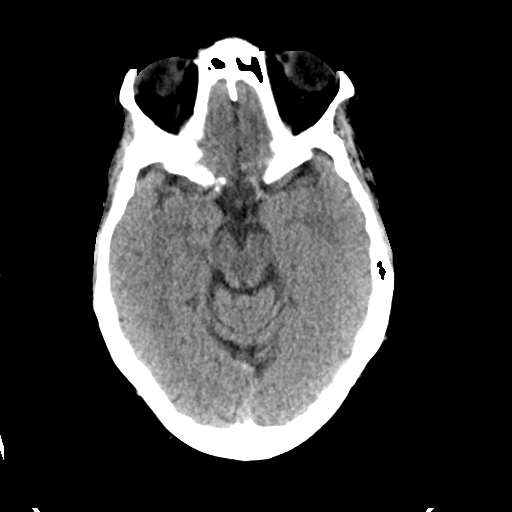
[im 19/37  brain]
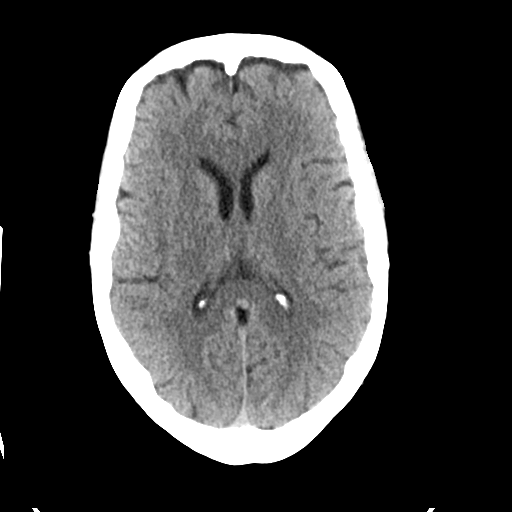
[im 23/37  brain]
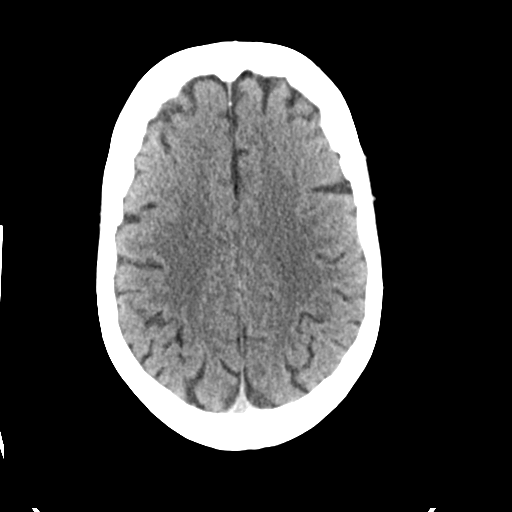
[im 23/37  bone]
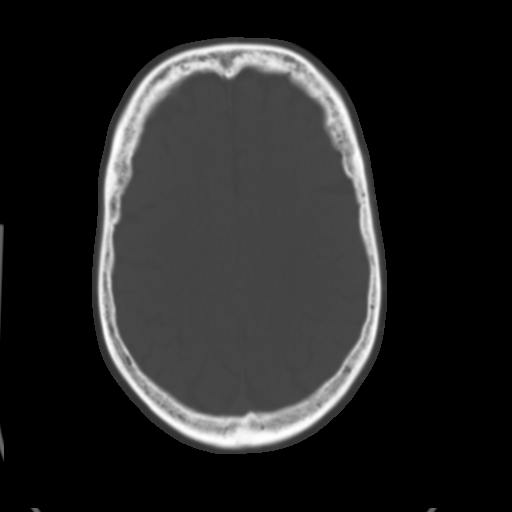
[im 28/37  brain]
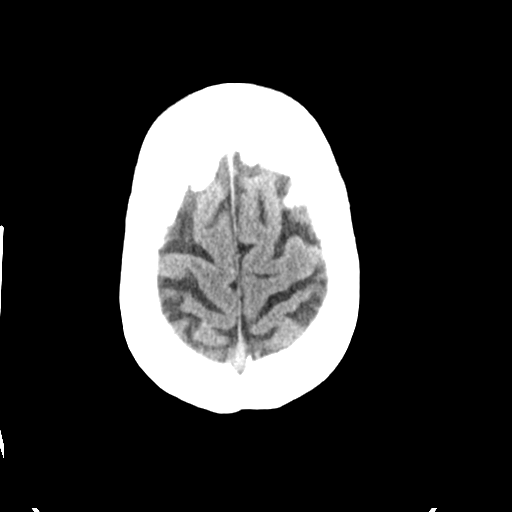
[im 32/37  brain]
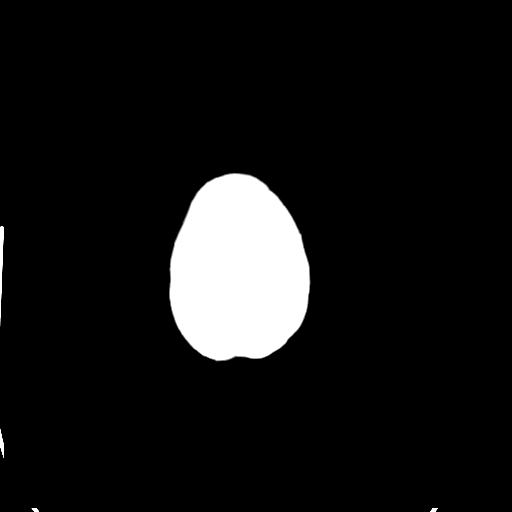

[Series 4: head bone · axial · 0.47mm/px · z∈[-109,-47]mm · 4 of 92 slices shown]
[im 10/92  bone]
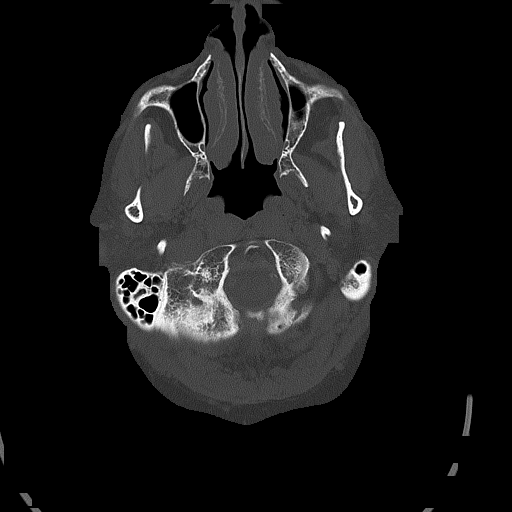
[im 19/92  bone]
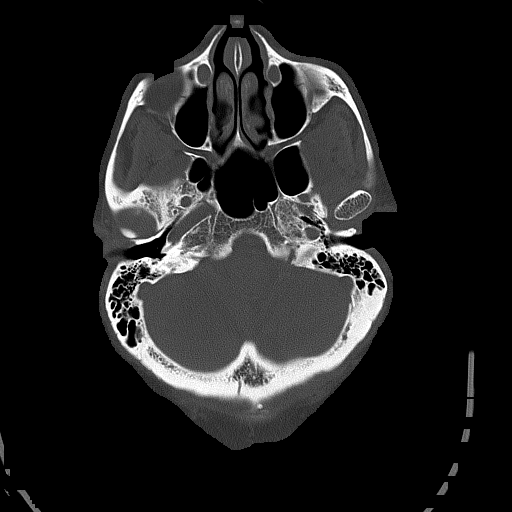
[im 28/92  bone]
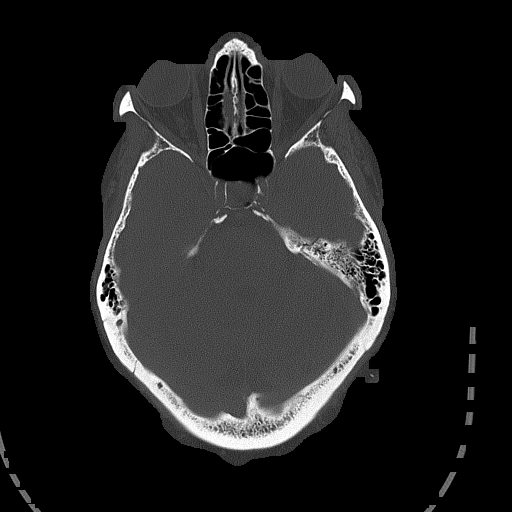
[im 41/92  bone]
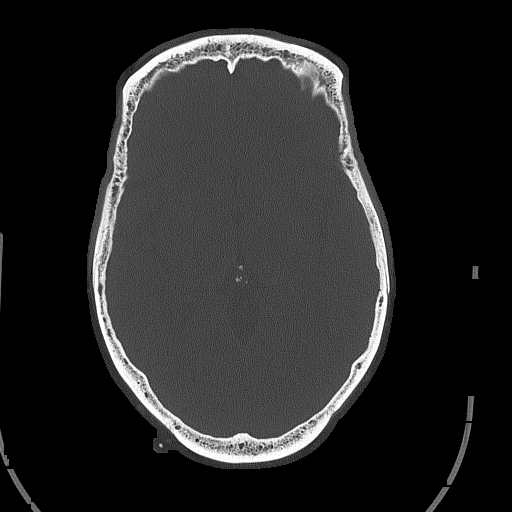

[Series 5: head without cor · coronal · non-contrast · 0.37mm/px · 3 of 75 slices shown]
[im 25/75  brain]
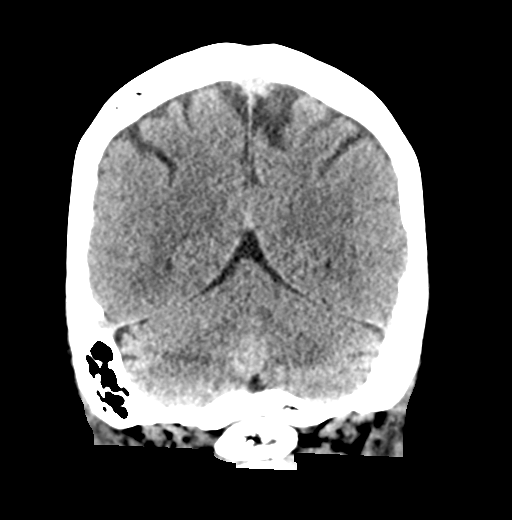
[im 33/75  brain]
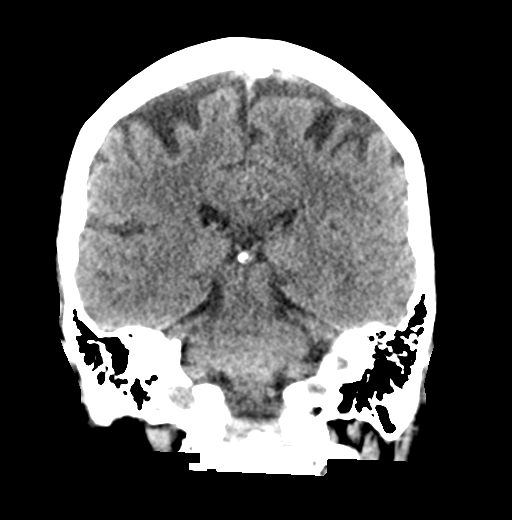
[im 42/75  brain]
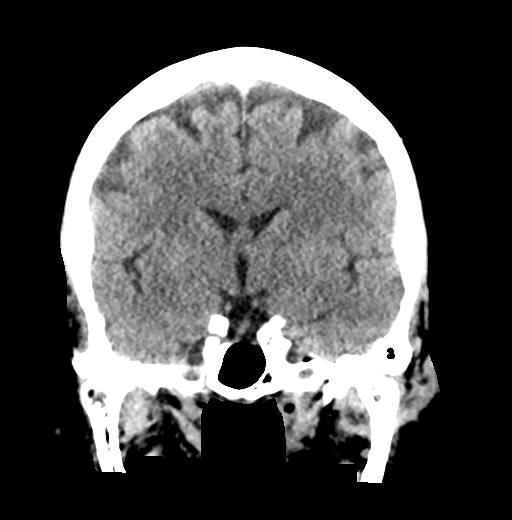

[Series 6: head without sag · sagittal · non-contrast · 0.35mm/px · 3 of 61 slices shown]
[im 21/61  brain]
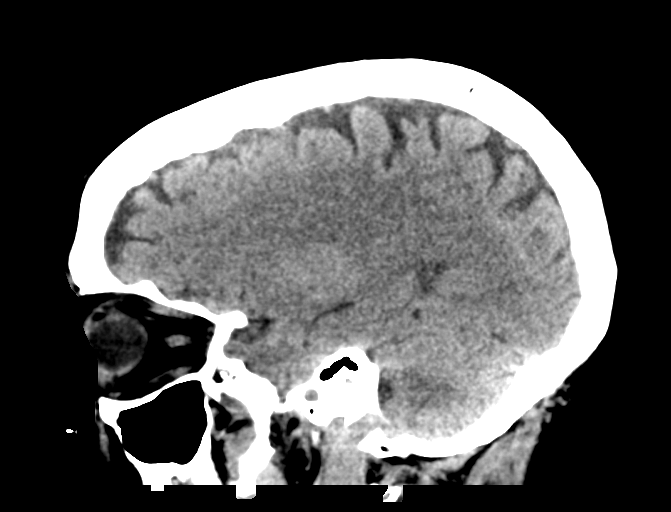
[im 31/61  brain]
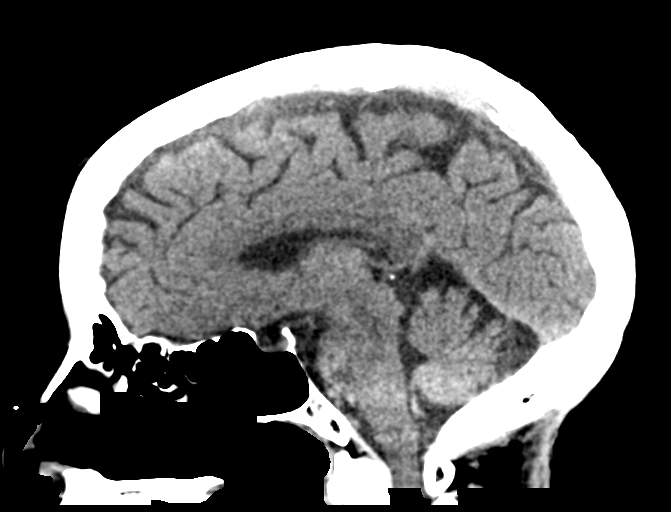
[im 41/61  brain]
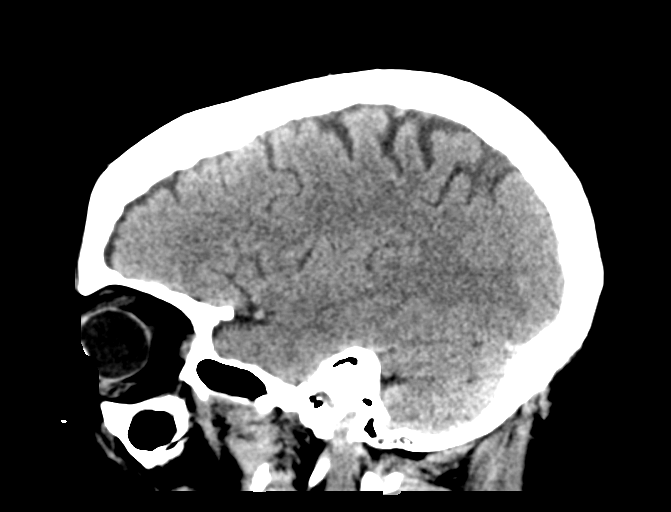

[17 of 47 positions shown; findings below may reference images not displayed]

FINDINGS: Brain: No evidence of acute infarction, hemorrhage, hydrocephalus,
extra-axial collection or mass lesion/mass effect.

Vascular: No hyperdense vessel or unexpected calcification.

Skull: Normal. Negative for fracture or focal lesion.

Sinuses/Orbits: Globes and orbits are unremarkable. Sinuses are
clear.

Other: None.
IMPRESSION: Negative unenhanced CT scan of the brain.

## 2022-02-09 NOTE — ED Provider Notes (Signed)
[facility] Provider Note   CSN: [id number] Arrival date & time: 02/09/22  1256     History {Add pertinent medical, surgical, social history, OB history to HPI:1} Chief Complaint  Patient presents with   Dizziness    Sheila Jacobs is a 71 y.o. female with a history of PE on Eliquis, breast cancer, HLD, HTN, obesity presenting to the ED with intermittent lightheadedness and changes in vision.  Patient states that about 1 month ago she started a new medication for her breast cancer (letrozole).  She states that soon after starting this medication, she started having episodes where she felt lightheaded like she might pass out and has noted intermittent "black spots in her vision" and blurry vision.  She states that the lightheadedness does seem to worsen upon standing, but is unclear whether these episodes also happen at rest.  No sensation of the room spinning.  She has also had intermittent headaches that she describes as a tightness over the forehead. She denies any fevers, chest pain, shortness of breath, abdominal pain, nausea/vomiting/diarrhea.  No weakness or slurred speech.  She states that she thought the medication was causing the symptoms so she stopped taking it after about 4 days.  Denies any recent falls.   Dizziness Associated symptoms: no chest pain, no diarrhea, no nausea, no shortness of breath, no vomiting and no weakness       Home Medications Prior to Admission medications   Medication Sig Start Date End Date Taking? Authorizing Provider  abemaciclib (VERZENIO) 100 MG tablet Take 1 tablet (100 mg total) by mouth 2 (two) times daily. Swallow tablets whole. Do not chew, crush, or split tablets before swallowing. 12/12/21   Nicholas Lose, MD  acetaminophen (TYLENOL) 500 MG tablet Take 500 mg by mouth every 6 (six) hours as needed for moderate pain or headache.    [provider]  Cholecalciferol (VITAMIN D) 125 MCG (5000 UT)  CAPS Take 5,000 Units by mouth daily.    [provider]  ELIQUIS 5 MG TABS tablet TAKE 1 TABLET BY MOUTH TWICE A DAY 07/19/21   Minus Breeding, MD  Ferrous Gluconate (IRON 27 PO) Take 1 tablet by mouth 2 (two) times daily.    [provider]  gabapentin (NEURONTIN) 100 MG capsule Take 100 mg by mouth at bedtime. 08/28/20   [provider]  hydrochlorothiazide (HYDRODIURIL) 25 MG tablet Take 1 tablet (25 mg total) by mouth daily. 07/17/18   Kathrene Alu, MD  letrozole (FEMARA) 2.5 MG tablet Take 1 tablet (2.5 mg total) by mouth daily. 12/05/21   Nicholas Lose, MD  levothyroxine (SYNTHROID) 137 MCG tablet TAKE 1 TABLET BY MOUTH DAILY BEFORE BREAKFAST. 07/18/21   Elayne Snare, MD  metoprolol succinate (TOPROL-XL) 50 MG 24 hr tablet Take 50 mg by mouth daily. 06/22/20   [provider]  nitroGLYCERIN (NITROSTAT) 0.4 MG SL tablet 0.4 mg every 5 (five) minutes as needed for chest pain. 07/17/21   [provider]  omeprazole (PRILOSEC) 20 MG capsule Take 20 mg by mouth daily.    [provider]  triamcinolone cream (KENALOG) 0.1 % 1 application daily as needed (Rash). 07/24/21   [provider]  vitamin B-12 (CYANOCOBALAMIN) 1000 MCG tablet Take 1,000 mcg by mouth daily.    [provider]      Allergies    Cortisone, Effexor [venlafaxine], Lexapro [escitalopram], and Xarelto [rivaroxaban]    Review of Systems   Review of Systems  Constitutional:  Negative for fever.  Respiratory:  Negative for cough and shortness of breath.   Cardiovascular:  Negative for chest pain.  Gastrointestinal:  Negative for abdominal pain, diarrhea, nausea and vomiting.  Neurological:  Positive for light-headedness. Negative for dizziness, seizures, syncope, speech difficulty, weakness and numbness.   Physical Exam Updated Vital Signs BP (!) 143/59   Pulse 60   Temp 97.7 F (36.5 C) (Oral)   Resp 16   Ht '5\' 2"'$  (1.575 m)   Wt 134.7 kg   SpO2  100%   BMI 54.32 kg/m  Physical Exam Vitals and nursing note reviewed.  Constitutional:      General: She is not in acute distress.    Appearance: She is obese. She is not toxic-appearing or diaphoretic.  HENT:     Head: Normocephalic and atraumatic.     Right Ear: External ear normal.     Left Ear: External ear normal.     Nose: Nose normal.     Mouth/Throat:     Mouth: Mucous membranes are moist.     Pharynx: Oropharynx is clear.  Eyes:     General: No scleral icterus.    Extraocular Movements: Extraocular movements intact.     Pupils: Pupils are equal, round, and reactive to light.  Cardiovascular:     Rate and Rhythm: Normal rate and regular rhythm.     Heart sounds: Normal heart sounds. No murmur heard.   No friction rub. No gallop.  Pulmonary:     Effort: Pulmonary effort is normal. No respiratory distress.     Breath sounds: Normal breath sounds. No stridor. No wheezing, rhonchi or rales.  Abdominal:     Palpations: Abdomen is soft.     Tenderness: There is no abdominal tenderness. There is no guarding or rebound.  Musculoskeletal:        General: No deformity.     Cervical back: Neck supple. No rigidity.     Right lower leg: Edema present.     Left lower leg: Edema present.  Skin:    General: Skin is warm and dry.  Neurological:     General: No focal deficit present.     Mental Status: She is alert and oriented to person, place, and time.     Cranial Nerves: No cranial nerve deficit.     Sensory: No sensory deficit.     Motor: No weakness.     Coordination: Coordination normal.    ED Results / Procedures / Treatments   Labs (all labs ordered are listed, but only abnormal results are displayed) Labs Reviewed  BASIC METABOLIC PANEL - Abnormal; Notable for the following components:      Result Value   Glucose, Bld 111 (*)    All other components within normal limits  CBC - Abnormal; Notable for the following components:   WBC 3.8 (*)    RBC 3.76 (*)     Hemoglobin 11.9 (*)    All other components within normal limits  HEPATIC FUNCTION PANEL - Abnormal; Notable for the following components:   Albumin 3.4 (*)    All other components within normal limits  CBG MONITORING, ED - Abnormal; Notable for the following components:   Glucose-Capillary 69 (*)    All other components within normal limits  URINALYSIS, ROUTINE W REFLEX MICROSCOPIC    EKG None  Radiology CT HEAD WO CONTRAST  Result Date: 02/09/2022 CLINICAL DATA:  Dizziness.  Recently started on a cancer medication. EXAM: CT HEAD WITHOUT  CONTRAST TECHNIQUE: Contiguous axial images were obtained from the base of the skull through the vertex without intravenous contrast. RADIATION DOSE REDUCTION: This exam was performed according to the departmental dose-optimization program which includes automated exposure control, adjustment of the mA and/or kV according to patient size and/or use of iterative reconstruction technique. COMPARISON:  02/08/2015. FINDINGS: Brain: No evidence of acute infarction, hemorrhage, hydrocephalus, extra-axial collection or mass lesion/mass effect. Vascular: No hyperdense vessel or unexpected calcification. Skull: Normal. Negative for fracture or focal lesion. Sinuses/Orbits: Globes and orbits are unremarkable. Sinuses are clear. Other: None. IMPRESSION: Negative unenhanced CT scan of the brain. Electronically Signed   By: Lajean Manes M.D.   On: 02/09/2022 14:16    Procedures Procedures  {Document cardiac monitor, telemetry assessment procedure when appropriate:1}  Medications Ordered in ED Medications - No data to display  ED Course/ Medical Decision Making/ A&P                           Medical Decision Making Amount and/or Complexity of Data Reviewed Labs: ordered. Radiology: ordered.   JEANA KERSTING is a 71 y.o. female with a history of PE on Eliquis, breast cancer, HLD, HTN, obesity presenting to the ED with intermittent lightheadedness and changes  in vision.  On exam, patient is afebrile and hemodynamically stable.  She has no focal neurologic deficits on exam.  However, she does continue to endorse intermittent blurry vision and "black spots" across her vision.  On chart review, patient does have a history of breast cancer and had an MRI brain on 3/22 that showed a tiny focus of possible enhancement in the right frontal white matter (favored to be benign but recommended 3-4-week follow-up).  Given that she has these new complaints including changes in vision, headaches, and intermittent lightheadedness, I am concerned for possible metastasis versus TIA/stroke.  CT head did not show any acute intracranial abnormality.  Will obtain MRI of the brain with and without contrast for further evaluation.  Low suspicion for ACS given patient has no chest pain or shortness of breath.  ECG was obtained which showed a sinus bradycardia with first-degree AV block and nonspecific T wave abnormalities.  On prior ECGs, patient had a rate in the 60s with similar nonspecific T wave abnormalities.  LFTs are unremarkable.  BNP within normal limits.  CBC with mild leukopenia with a WBC of 3.8 and hemoglobin 11.9 which appears to be the patient's baseline.  ***  {Document critical care time when appropriate:1} {Document review of labs and clinical decision tools ie heart score, Chads2Vasc2 etc:1}  {Document your independent review of radiology images, and any outside records:1} {Document your discussion with family members, caretakers, and with consultants:1} {Document social determinants of health affecting pt's care:1} {Document your decision making why or why not admission, treatments were needed:1} Final Clinical Impression(s) / ED Diagnoses Final diagnoses:  None    Rx / DC Orders ED Discharge Orders     None

## 2022-02-09 NOTE — ED Triage Notes (Signed)
Reports dizziness and seeing black dots when starting a new  medication called letrazole

## 2022-02-09 NOTE — ED Notes (Signed)
Ambulated to restroom without assistance 

## 2022-02-09 NOTE — ED Notes (Signed)
Pt contacted [facility] tor transport to ED.

## 2022-02-09 NOTE — ED Provider Triage Note (Signed)
Emergency Medicine Provider Triage Evaluation Note  Sheila Jacobs , a 71 y.o. female  was evaluated in triage.  Pt complains of lightheadedness. Pt report she was started on a cancer medication a month ago, to a few doses and started having bouts of lightheadedness or spots in her vision worsening with positional change.  She d/c the medication after taking it for a few days yet her sxs persists  Review of Systems  Positive: As above Negative: As above  Physical Exam  BP (!) 157/60   Pulse 65   Temp 98.1 F (36.7 C) (Oral)   Resp 18   Ht '5\' 2"'$  (1.575 m)   Wt 134.7 kg   SpO2 99%   BMI 54.32 kg/m  Gen:   Awake, no distress   Resp:  Normal effort  MSK:   Moves extremities without difficulty  Other:    Medical Decision Making  Medically screening exam initiated at 1:19 PM.  Appropriate orders placed.  Sheila Jacobs was informed that the remainder of the evaluation will be completed by another provider, this initial triage assessment does not replace that evaluation, and the importance of remaining in the ED until their evaluation is complete.     Domenic Moras, PA-C 02/09/22 1327

## 2022-02-09 NOTE — ED Notes (Signed)
Pt informed of need to go to ED for follow up on Sx's. Pt offered an ambulance but refused. Pt is trying to call Granddaughter for a ride to ED

## 2022-02-09 NOTE — ED Notes (Signed)
Patient transported to MRI 

## 2022-02-09 NOTE — Discharge Instructions (Addendum)
-  Please head straight to the ED for evaluation of your dizziness and visual changes. Please have a family member or friend drive you straight there.

## 2022-02-09 NOTE — ED Triage Notes (Signed)
Pt reports high BP. Pt has Meds for BP from MD but does not think the med works. Pt does not check BP at home.

## 2022-02-09 NOTE — ED Provider Notes (Signed)
[facility]    CSN: [id number] Arrival date & time: 02/09/22  1145      History   Chief Complaint Chief Complaint  Patient presents with   Hypertension    HPI Sheila Jacobs is a 71 y.o. female presenting with new onset of dizziness and black spots in vision over the last day.  She states that she was recently started on a new blood pressure medication, she is unsure what this is, but she states for the last day she has now had dizziness and some vision changes/black spots in vision.  Denies headaches, chest pain, shortness of breath, focal weakness.  She states she is concerned that she could be having a stroke, though she has no history of this and is not having focal weakness. She does take long-term anticoagulation for history PE.  HPI  Past Medical History:  Diagnosis Date   Acute saddle pulmonary embolism without acute cor pulmonale (HCC) 12/29/2017   Anginal pain (HCC)    No angiographic evidence of CAD by cath 10/21/14   Arthritis    Breast cancer of upper-outer quadrant of left female breast ([place]) 01/31/2016   Colon polyps    GERD (gastroesophageal reflux disease)    Hearing deficit    Pt reports wearing hearing aides   Hearing loss    bilateral   History of radiation therapy 10/9-11/20/17   Left Breast 45 Gy in 25 fractions, Left Breast Boost 6 Gy in 3 fractions.    Hyperlipidemia    Hypertension    Morbid obesity ([place])    OSA (obstructive sleep apnea) 01/23/2018   uses cpap   Palpitations 10/16/2016   Personal history of noncompliance with medical treatment, presenting hazards to health 08/27/2016   Personal history of radiation therapy    Thyroid goiter     Patient Active Problem List   Diagnosis Date Noted   Hearing deficit 02/09/2022   Multiple pulmonary nodules 10/19/2021   Heel callus 06/14/2021   Pes planus 06/14/2021   Lymphedema 03/07/2021   Pharyngoesophageal dysphagia 11/26/2019   Bilateral temporomandibular joint pain 03/05/2019    Presbycusis of both ears 03/05/2019   Medication management 10/22/2018   History of pulmonary embolus (PE), 12/2017 10/22/2018   Chronic anticoagulation 10/22/2018   Tinea corporis 08/22/2018   Right leg swelling 08/21/2018   Pain and swelling of ankle, right 04/24/2018   Cough 04/13/2018   Throat pain in adult 03/10/2018   OSA (obstructive sleep apnea) 01/23/2018   Hypothyroidism    Hypoxia    Acute saddle pulmonary embolism without acute cor pulmonale ([place]) 12/29/2017   History of breast cancer 10/23/2017   Post-surgical hypothyroidism 06/27/2017   Thyroid goiter 02/26/2017   Personal history of noncompliance with medical treatment, presenting hazards to health 08/27/2016   Generalized anxiety disorder 08/09/2016   Abnormal TSH 05/09/2016   Abnormality of gait 01/31/2016   Low back pain 01/31/2016   Breast cancer of upper-outer quadrant of left female breast ([place]) 01/31/2016   Former heavy cigarette smoker (20-39 per day) 11/08/2015   Esophageal reflux    GERD (gastroesophageal reflux disease) 05/03/2015   Dependent edema 05/03/2015   Morbid obesity ([place]) 04/29/2015   Right carotid bruit 10/06/2014   Atypical chest pain 09/18/2014   Hypertension 09/18/2014   Hyperlipidemia 09/18/2014   Enlarged LA (left atrium)     Past Surgical History:  Procedure Laterality Date   BREAST BIOPSY Left 01/2016   BREAST LUMPECTOMY Left 04/2016   BREAST LUMPECTOMY WITH RADIOACTIVE  SEED AND SENTINEL LYMPH NODE BIOPSY Left 04/11/2016   Procedure: BREAST LUMPECTOMY WITH RADIOACTIVE SEED AND SENTINEL LYMPH NODE BIOPSY;  Surgeon: Excell Seltzer, MD;  Location: [facility];  Service: General;  Laterality: Left;   BRONCHIAL BIOPSY  11/14/2021   Procedure: BRONCHIAL BIOPSIES;  Surgeon: Garner Nash, DO;  Location: [facility];  Service: Pulmonary;;   BRONCHIAL BRUSHINGS  11/14/2021   Procedure: BRONCHIAL BRUSHINGS;  Surgeon: Garner Nash, DO;  Location: [facility];  Service: Pulmonary;;    BRONCHIAL NEEDLE ASPIRATION BIOPSY  11/14/2021   Procedure: BRONCHIAL NEEDLE ASPIRATION BIOPSIES;  Surgeon: Garner Nash, DO;  Location: [facility];  Service: Pulmonary;;   COLONOSCOPY WITH PROPOFOL N/A 10/20/2015   Procedure: COLONOSCOPY WITH PROPOFOL;  Surgeon: Mauri Pole, MD;  Location: [facility];  Service: Endoscopy;  Laterality: N/A;   ESOPHAGOGASTRODUODENOSCOPY (EGD) WITH PROPOFOL N/A 10/20/2015   Procedure: ESOPHAGOGASTRODUODENOSCOPY (EGD) WITH PROPOFOL;  Surgeon: Mauri Pole, MD;  Location: [facility];  Service: Endoscopy;  Laterality: N/A;   IR ANGIOGRAM PULMONARY BILATERAL SELECTIVE  12/29/2017   IR ANGIOGRAM SELECTIVE EACH ADDITIONAL VESSEL  12/29/2017   IR ANGIOGRAM SELECTIVE EACH ADDITIONAL VESSEL  12/29/2017   IR INFUSION THROMBOL ARTERIAL INITIAL (MS)  12/29/2017   IR INFUSION THROMBOL ARTERIAL INITIAL (MS)  12/29/2017   IR THROMB F/U EVAL ART/VEN FINAL DAY (MS)  12/30/2017   IR US GUIDE VASC ACCESS RIGHT  12/29/2017   LEFT HEART CATHETERIZATION WITH CORONARY ANGIOGRAM N/A 10/21/2014   Procedure: LEFT HEART CATHETERIZATION WITH CORONARY ANGIOGRAM;  Surgeon: Burnell Blanks, MD;  Location: [facility];  Service: Cardiovascular;  Laterality: N/A;   THYROIDECTOMY N/A 02/26/2017   Procedure: TOTAL THYROIDECTOMY;  Surgeon: Excell Seltzer, MD;  Location: [facility];  Service: General;  Laterality: N/A;   TUBAL LIGATION     VIDEO BRONCHOSCOPY WITH RADIAL ENDOBRONCHIAL ULTRASOUND  11/14/2021   Procedure: RADIAL ENDOBRONCHIAL ULTRASOUND;  Surgeon: Garner Nash, DO;  Location: [facility];  Service: Pulmonary;;    OB History   No obstetric history on file.      Home Medications    Prior to Admission medications   Medication Sig Start Date End Date Taking? Authorizing Provider  abemaciclib (VERZENIO) 100 MG tablet Take 1 tablet (100 mg total) by mouth 2 (two) times daily. Swallow tablets whole. Do not chew, crush, or split tablets before swallowing. 12/12/21    Nicholas Lose, MD  acetaminophen (TYLENOL) 500 MG tablet Take 500 mg by mouth every 6 (six) hours as needed for moderate pain or headache.    [provider]  Cholecalciferol (VITAMIN D) 125 MCG (5000 UT) CAPS Take 5,000 Units by mouth daily.    [provider]  ELIQUIS 5 MG TABS tablet TAKE 1 TABLET BY MOUTH TWICE A DAY 07/19/21   Minus Breeding, MD  Ferrous Gluconate (IRON 27 PO) Take 1 tablet by mouth 2 (two) times daily.    [provider]  gabapentin (NEURONTIN) 100 MG capsule Take 100 mg by mouth at bedtime. 08/28/20   [provider]  hydrochlorothiazide (HYDRODIURIL) 25 MG tablet Take 1 tablet (25 mg total) by mouth daily. 07/17/18   Kathrene Alu, MD  letrozole (FEMARA) 2.5 MG tablet Take 1 tablet (2.5 mg total) by mouth daily. 12/05/21   Nicholas Lose, MD  levothyroxine (SYNTHROID) 137 MCG tablet TAKE 1 TABLET BY MOUTH DAILY BEFORE BREAKFAST. 07/18/21   Elayne Snare, MD  metoprolol succinate (TOPROL-XL) 50 MG 24 hr tablet Take 50 mg by mouth  daily. 06/22/20   [provider]  nitroGLYCERIN (NITROSTAT) 0.4 MG SL tablet 0.4 mg every 5 (five) minutes as needed for chest pain. 07/17/21   [provider]  omeprazole (PRILOSEC) 20 MG capsule Take 20 mg by mouth daily.    [provider]  triamcinolone cream (KENALOG) 0.1 % 1 application daily as needed ([place]). 07/24/21   [provider]  vitamin B-12 (CYANOCOBALAMIN) 1000 MCG tablet Take 1,000 mcg by mouth daily.    [provider]    Family History Family History  Problem Relation Age of Onset   Hypertension Mother    Heart disease Mother        Pacemaker   Kidney disease Mother    Cataracts Mother    Hearing loss Mother    Diabetes Mother    Liver disease Mother    Diabetes Sister    Cataracts Brother    Hearing loss Brother    Colon cancer Neg Hx    Thyroid disease Neg Hx     Social History Social History   Tobacco Use   Smoking status: Former     Packs/day: 1.00    Years: 45.00    Pack years: 45.00    Types: Cigarettes    Quit date: 07/11/2014    Years since quitting: 7.5   Smokeless tobacco: Never  Vaping Use   Vaping Use: Never used  Substance Use Topics   Alcohol use: No    Alcohol/week: 0.0 standard drinks   Drug use: No     Allergies   Cortisone, Effexor [venlafaxine], Lexapro [escitalopram], and Xarelto [rivaroxaban]   Review of Systems Review of Systems  Eyes:  Positive for visual disturbance.  Neurological:  Positive for dizziness.  All other systems reviewed and are negative.   Physical Exam Triage Vital Signs ED Triage Vitals  Enc Vitals Group     BP 02/09/22 1204 (!) 155/77     Pulse Rate 02/09/22 1204 65     Resp 02/09/22 1204 20     Temp 02/09/22 1204 97.7 F (36.5 C)     Temp src --      SpO2 02/09/22 1204 99 %     Weight --      Height --      Head Circumference --      Peak Flow --      Pain Score 02/09/22 1203 0     Pain Loc --      Pain Edu? --      Excl. in [place]? --    No data found.  Updated Vital Signs BP (!) 155/77   Pulse 65   Temp 97.7 F (36.5 C)   Resp 20   SpO2 99%   Visual Acuity Right Eye Distance:   Left Eye Distance:   Bilateral Distance:    Right Eye Near:   Left Eye Near:    Bilateral Near:     Physical Exam Vitals reviewed.  Constitutional:      Appearance: Normal appearance. She is obese. She is not diaphoretic.  HENT:     Head: Normocephalic and atraumatic.     Mouth/Throat:     Mouth: Mucous membranes are moist.  Eyes:     Extraocular Movements: Extraocular movements intact.     Pupils: Pupils are equal, round, and reactive to light.  Cardiovascular:     Rate and Rhythm: Normal rate and regular rhythm.     Pulses:  Radial pulses are 2+ on the right side and 2+ on the left side.     Heart sounds: Normal heart sounds.  Pulmonary:     Effort: Pulmonary effort is normal.     Breath sounds: Normal breath sounds.  Chest:     Chest  wall: No tenderness.  Abdominal:     Palpations: Abdomen is soft.     Tenderness: There is no abdominal tenderness. There is no guarding or rebound.  Musculoskeletal:     Right lower leg: No edema.     Left lower leg: No edema.  Skin:    General: Skin is warm.     Capillary Refill: Capillary refill takes less than 2 seconds.  Neurological:     General: No focal deficit present.     Mental Status: She is alert and oriented to person, place, and time.     Comments: PERRLA, EOMI. Strength and sensation grossly intact upper and lower extremities. Grip strength 5/5 bilaterally.   Psychiatric:        Mood and Affect: Mood normal.        Behavior: Behavior normal.        Thought Content: Thought content normal.        Judgment: Judgment normal.     UC Treatments / Results  Labs (all labs ordered are listed, but only abnormal results are displayed) Labs Reviewed - No data to display  EKG   Radiology No results found.  Procedures Procedures (including critical care time)  Medications Ordered in UC Medications - No data to display  Initial Impression / Assessment and Plan / UC Course  I have reviewed the triage vital signs and the nursing notes.  Pertinent labs & imaging results that were available during my care of the patient were reviewed by me and considered in my medical decision making (see chart for details).     This patient is a very pleasant 71 y.o. year old female presenting with new onset of dizziness and visual changes following starting a new blood pressure medication. Her BP is currently 155/77, and neuro exam is reassuring. She takes long-term Eliquis for history PE. Cannot exclude intracranial pathology / CVA or TIA in the urgent care setting. As she took a taxi to the UC, EMS was called for transport. Patient is in agreement.   Final Clinical Impressions(s) / UC Diagnoses   Final diagnoses:  Dizziness     Discharge Instructions      -Please head  straight to the ED for evaluation of your dizziness and visual changes. Please have a family member or friend drive you straight there.    ED Prescriptions   None    PDMP not reviewed this encounter.   Hazel Sams, PA-C 02/09/22 1245

## 2022-02-10 NOTE — Discharge Instructions (Signed)
Your blood work today was reassuring.  The MRI of your brain did not show any signs of stroke. I do recommend that you follow-up with your oncologist as soon as possible to further discuss your current medications.  Please return to the emergency department if you have worsening of your symptoms.

## 2022-02-12 ENCOUNTER — Other Ambulatory Visit: Payer: Self-pay | Admitting: Cardiology

## 2022-02-12 NOTE — Telephone Encounter (Signed)
Prescription refill request for Eliquis received. Indication:PE Last office visit:10/22 Scr:0.8 Age: 71 Weight:134.7 kg  Prescription refilled

## 2022-02-13 ENCOUNTER — Other Ambulatory Visit (HOSPITAL_COMMUNITY): Payer: Self-pay

## 2022-02-15 ENCOUNTER — Other Ambulatory Visit (HOSPITAL_COMMUNITY): Payer: Self-pay

## 2022-02-21 ENCOUNTER — Telehealth: Payer: Self-pay | Admitting: Hematology and Oncology

## 2022-02-21 ENCOUNTER — Telehealth: Payer: Self-pay | Admitting: *Deleted

## 2022-02-21 ENCOUNTER — Other Ambulatory Visit (HOSPITAL_COMMUNITY): Payer: Self-pay

## 2022-02-21 NOTE — Telephone Encounter (Signed)
Received message from [facility] oral pharmacist stating pt has been recently hospitalized and has no MD f/u appts.  RN attempt x1 to contact pt and pt daughter, no answer.  F/U scheduled and RN LVM with appt information for pt.

## 2022-02-21 NOTE — Telephone Encounter (Signed)
.  Called pt per 6/14 inbasket , Patient was unavailable, a message with appt time and date was left with number on file.

## 2022-02-22 ENCOUNTER — Telehealth: Payer: Self-pay | Admitting: *Deleted

## 2022-02-22 NOTE — Telephone Encounter (Signed)
RN second attempt to contact pt regarding upcoming appt.  No answer, LVM for pt to return call to the office.

## 2022-02-23 ENCOUNTER — Telehealth: Payer: Self-pay | Admitting: *Deleted

## 2022-02-23 NOTE — Telephone Encounter (Signed)
RN 5th attempt at trying to contact.  No answer, unable to LVM due to VM being full.  RN contact pt daughter on file who stated she will alert pt to contact our office.

## 2022-02-23 NOTE — Telephone Encounter (Signed)
RN's 3rd attempt at trying to contact pt regarding scheduling upcoming appt.  No answer, LVM for pt to return call to the office.

## 2022-02-26 ENCOUNTER — Inpatient Hospital Stay: Payer: Medicare Other | Admitting: Hematology and Oncology

## 2022-02-26 ENCOUNTER — Inpatient Hospital Stay: Payer: Medicare Other | Attending: Hematology and Oncology

## 2022-02-26 NOTE — Assessment & Plan Note (Deleted)
Left lumpectomy 04/11/2016: IDC grade 3, 1.2 cm, IG DCIS, LVI present, 0/8 lymph nodes negative, ER 100%, PR 90%, HER-2 negative, Ki-67 10%, T1 cN0 stage I a pathologic stage Adj XRT 06/18/16 to 07/30/16 Adj Anti-estrogen therapy with Letrozole 2.5 mg dailystarted 08/29/2016 Metastatic breast cancer detected 08/12/2021: Multiple bilateral lung nodules  Bronchoscopy 11/14/2021 by Dr. Valeta Harms: Lung right middle lobe aspiration: Malignant cells consistent with metastatic carcinoma, RUL aspiration: Metastatic carcinoma brushings: Atypical cells present ER 95% consistent with metastatic breast cancer, HER2 not available  Current treatment: Verzinio with letrozole started 12/05/2021 Verzinio toxicities:  I recommend obtaining a CT scan for further evaluation and to assess response to treatment.

## 2022-03-02 ENCOUNTER — Other Ambulatory Visit: Payer: Self-pay | Admitting: Hematology and Oncology

## 2022-03-06 ENCOUNTER — Telehealth: Payer: Self-pay | Admitting: *Deleted

## 2022-03-07 ENCOUNTER — Telehealth: Payer: Self-pay

## 2022-03-07 ENCOUNTER — Telehealth: Payer: Self-pay | Admitting: *Deleted

## 2022-03-07 NOTE — Telephone Encounter (Signed)
Patient called in states that Dr Arthur Holms said her thyroid level was too high and put her on 125 mcg of levothyroxine and told her to start takinkg midday and stop taking in the morning. Patient wanted you to know because she has always took in the morning and yo

## 2022-03-07 NOTE — Telephone Encounter (Signed)
Received call from pt stating she does not wish to f/u with MD at this time.  Pt states she has stopped Verzenio and Anastrozole and does not wish to continue at this time.  RN offered pt MD f/u to discuss other tx options/ prognosis without treatment and pt politely declined.  Pt states she will contact the office when she is ready to be seen again.  MD notified and verbalized understanding.

## 2022-03-22 ENCOUNTER — Other Ambulatory Visit: Payer: Medicare Other

## 2022-03-22 ENCOUNTER — Inpatient Hospital Stay: Payer: Medicare Other | Admitting: Hematology and Oncology

## 2022-03-26 ENCOUNTER — Other Ambulatory Visit (INDEPENDENT_AMBULATORY_CARE_PROVIDER_SITE_OTHER): Payer: Medicare Other

## 2022-03-26 DIAGNOSIS — E890 Postprocedural hypothyroidism: Secondary | ICD-10-CM | POA: Diagnosis not present

## 2022-03-26 LAB — T4, FREE: Free T4: 1.29 ng/dL (ref 0.60–1.60)

## 2022-03-26 LAB — TSH: TSH: 0.67 u[IU]/mL (ref 0.35–5.50)

## 2022-03-29 ENCOUNTER — Ambulatory Visit: Payer: Medicare Other | Admitting: Endocrinology

## 2022-04-04 ENCOUNTER — Ambulatory Visit (INDEPENDENT_AMBULATORY_CARE_PROVIDER_SITE_OTHER): Payer: Medicare Other | Admitting: Endocrinology

## 2022-04-04 ENCOUNTER — Encounter: Payer: Self-pay | Admitting: Endocrinology

## 2022-04-04 VITALS — BP 126/78 | HR 72 | Ht 62.0 in | Wt 300.2 lb

## 2022-04-04 DIAGNOSIS — E890 Postprocedural hypothyroidism: Secondary | ICD-10-CM | POA: Diagnosis not present

## 2022-04-04 MED ORDER — LEVOTHYROXINE SODIUM 125 MCG PO TABS
125.0000 ug | ORAL_TABLET | Freq: Every day | ORAL | 1 refills | Status: DC
Start: 2022-04-04 — End: 2022-08-01

## 2022-04-04 NOTE — Progress Notes (Signed)
Patient ID: Sheila Jacobs, female   DOB: Jan 01, 1951, 71 y.o.   MRN: [id number]           Reason for Appointment: HYPOTHYROIDISM, follow-up      History of Present Illness:   The patient's thyroid enlargement was first discovered in 2009 probably on her routine physical exam  [facility]   She however does not know when her goiter first started    On her initial evaluation she did have needle biopsy of her large right-sided nodule which was benign  Clinically she was felt to have a colloid goiter, mostly right sided  Because of her local pressure symptoms with choking feeling, coughing she was referred for thyroidectomy which was done in June 2018 This had relieved her local pressure symptoms  RECENT history:  She has been previously on 150 mcg of levothyroxine supplementation for her postsurgical hypothyroidism since about 08/2017 Because of her TSH being 0.22 in 12/2018 her dose was reduced to 137 mcg  In 1/23 she was on the 137 mcg levothyroxine dose However when seen by her PCP in June 2023 her TSH was 0.23 and her dose was reduced to 0.125 mg daily She says the only complaint she had at that time was a feeling of thumping in her right lower neck on the side but cannot describe it further, has had this symptom before also.  Did not have any unusual palpitations which she tends to have fallen off anyway  Did not feel any different with the change in dosage  No unusual fatigue lately and her weight is about the same  She has been regular with taking her generic levothyroxine with water in the morning on empty stomach   TSH is normal  Wt Readings from Last 3 Encounters:  04/04/22 (!) 300 lb 3.2 oz (136.2 kg)  02/09/22 297 lb (134.7 kg)  12/27/21 297 lb 9.6 oz (135 kg)    Lab Results  Component Value Date   TSH 0.67 03/26/2022   TSH 0.48 09/21/2021   TSH 1.27 08/17/2020   FREET4 1.29 03/26/2022   FREET4 1.42 09/21/2021    FREET4 1.30 08/17/2020      Allergies as of 04/04/2022       Reactions   Cortisone Rash   Effexor [venlafaxine] Palpitations   Lexapro [escitalopram] Palpitations   Heart racing   Xarelto [rivaroxaban] Rash        Medication List        Accurate as of April 04, 2022  8:32 AM. If you have any questions, ask your nurse or doctor.          acetaminophen 500 MG tablet Commonly known as: TYLENOL Take 500 mg by mouth every 6 (six) hours as needed for moderate pain or headache.   Eliquis 5 MG Tabs tablet Generic drug: apixaban TAKE 1 TABLET BY MOUTH TWICE A DAY   gabapentin 100 MG capsule Commonly known as: NEURONTIN Take 100 mg by mouth at bedtime.   hydrochlorothiazide 25 MG tablet Commonly known as: HYDRODIURIL Take 1 tablet (25 mg total) by mouth daily.   IRON 27 PO Take 1 tablet by mouth 2 (two) times daily.   letrozole 2.5 MG tablet Commonly known as: FEMARA Take 1 tablet (2.5 mg total) by mouth daily.   levothyroxine 137 MCG tablet Commonly known as: SYNTHROID TAKE 1 TABLET BY MOUTH DAILY BEFORE BREAKFAST.   metoprolol succinate 50 MG 24 hr tablet Commonly known  as: TOPROL-XL Take 50 mg by mouth daily.   nitroGLYCERIN 0.4 MG SL tablet Commonly known as: NITROSTAT 0.4 mg every 5 (five) minutes as needed for chest pain.   omeprazole 20 MG capsule Commonly known as: PRILOSEC Take 20 mg by mouth daily.   triamcinolone cream 0.1 % Commonly known as: KENALOG 1 application daily as needed (Rash).   Verzenio 100 MG tablet Generic drug: abemaciclib Take 1 tablet (100 mg total) by mouth 2 (two) times daily. Swallow tablets whole. Do not chew, crush, or split tablets before swallowing.   vitamin B-12 1000 MCG tablet Commonly known as: CYANOCOBALAMIN Take 1,000 mcg by mouth daily.   Vitamin D 125 MCG (5000 UT) Caps Take 5,000 Units by mouth daily.        Allergies:  Allergies  Allergen Reactions   Cortisone Rash   Effexor [Venlafaxine]  Palpitations   Lexapro [Escitalopram] Palpitations    Heart racing   Xarelto [Rivaroxaban] Rash    Past Medical History:  Diagnosis Date   Acute saddle pulmonary embolism without acute cor pulmonale (HCC) 12/29/2017   Anginal pain (HCC)    No angiographic evidence of CAD by cath 10/21/14   Arthritis    Breast cancer of upper-outer quadrant of left female breast ([place]) 01/31/2016   Colon polyps    GERD (gastroesophageal reflux disease)    Hearing deficit    Pt reports wearing hearing aides   Hearing loss    bilateral   History of radiation therapy 10/9-11/20/17   Left Breast 45 Gy in 25 fractions, Left Breast Boost 6 Gy in 3 fractions.    Hyperlipidemia    Hypertension    Morbid obesity (HCC)    OSA (obstructive sleep apnea) 01/23/2018   uses cpap   Palpitations 10/16/2016   Personal history of noncompliance with medical treatment, presenting hazards to health 08/27/2016   Personal history of radiation therapy    Thyroid goiter     Past Surgical History:  Procedure Laterality Date   BREAST BIOPSY Left 01/2016   BREAST LUMPECTOMY Left 04/2016   BREAST LUMPECTOMY WITH RADIOACTIVE SEED AND SENTINEL LYMPH NODE BIOPSY Left 04/11/2016   Procedure: BREAST LUMPECTOMY WITH RADIOACTIVE SEED AND SENTINEL LYMPH NODE BIOPSY;  Surgeon: Excell Seltzer, MD;  Location: [facility];  Service: General;  Laterality: Left;   BRONCHIAL BIOPSY  11/14/2021   Procedure: BRONCHIAL BIOPSIES;  Surgeon: Garner Nash, DO;  Location: [facility];  Service: Pulmonary;;   BRONCHIAL BRUSHINGS  11/14/2021   Procedure: BRONCHIAL BRUSHINGS;  Surgeon: Garner Nash, DO;  Location: [facility];  Service: Pulmonary;;   BRONCHIAL NEEDLE ASPIRATION BIOPSY  11/14/2021   Procedure: BRONCHIAL NEEDLE ASPIRATION BIOPSIES;  Surgeon: Garner Nash, DO;  Location: [facility];  Service: Pulmonary;;   COLONOSCOPY WITH PROPOFOL N/A 10/20/2015   Procedure: COLONOSCOPY WITH PROPOFOL;  Surgeon: Mauri Pole, MD;  Location:  [facility];  Service: Endoscopy;  Laterality: N/A;   ESOPHAGOGASTRODUODENOSCOPY (EGD) WITH PROPOFOL N/A 10/20/2015   Procedure: ESOPHAGOGASTRODUODENOSCOPY (EGD) WITH PROPOFOL;  Surgeon: Mauri Pole, MD;  Location: [facility];  Service: Endoscopy;  Laterality: N/A;   IR ANGIOGRAM PULMONARY BILATERAL SELECTIVE  12/29/2017   IR ANGIOGRAM SELECTIVE EACH ADDITIONAL VESSEL  12/29/2017   IR ANGIOGRAM SELECTIVE EACH ADDITIONAL VESSEL  12/29/2017   IR INFUSION THROMBOL ARTERIAL INITIAL (MS)  12/29/2017   IR INFUSION THROMBOL ARTERIAL INITIAL (MS)  12/29/2017   IR THROMB F/U EVAL ART/VEN FINAL DAY (MS)  12/30/2017   IR US GUIDE VASC  ACCESS RIGHT  12/29/2017   LEFT HEART CATHETERIZATION WITH CORONARY ANGIOGRAM N/A 10/21/2014   Procedure: LEFT HEART CATHETERIZATION WITH CORONARY ANGIOGRAM;  Surgeon: Burnell Blanks, MD;  Location: [facility];  Service: Cardiovascular;  Laterality: N/A;   THYROIDECTOMY N/A 02/26/2017   Procedure: TOTAL THYROIDECTOMY;  Surgeon: Excell Seltzer, MD;  Location: [facility];  Service: General;  Laterality: N/A;   TUBAL LIGATION     VIDEO BRONCHOSCOPY WITH RADIAL ENDOBRONCHIAL ULTRASOUND  11/14/2021   Procedure: RADIAL ENDOBRONCHIAL ULTRASOUND;  Surgeon: Garner Nash, DO;  Location: [facility];  Service: Pulmonary;;    Family History  Problem Relation Age of Onset   Hypertension Mother    Heart disease Mother        Pacemaker   Kidney disease Mother    Cataracts Mother    Hearing loss Mother    Diabetes Mother    Liver disease Mother    Diabetes Sister    Cataracts Brother    Hearing loss Brother    Colon cancer Neg Hx    Thyroid disease Neg Hx     Social History:  reports that she quit smoking about 7 years ago. Her smoking use included cigarettes. She has a 45.00 pack-year smoking history. She has never used smokeless tobacco. She reports that she does not drink alcohol and does not use drugs.    Review of Systems   Hypertension, treated by PCP  with hydrochlorothiazide   BP Readings from Last 3 Encounters:  04/04/22 126/78  02/10/22 114/64  02/09/22 (!) 155/77      Examination:   BP 126/78   Pulse 72   Ht '5\' 2"'$  (1.575 m)   Wt (!) 300 lb 3.2 oz (136.2 kg)   SpO2 96%   BMI 54.91 kg/m      Assessment/Plan:  Postsurgical hypothyroidism  She appears to be requiring somewhat less levothyroxine with TSH being low on 137 mcg that had been a stable dose Subjectively has no symptoms Recently with reducing her dose her TSH is coming back to normal and now 0.7  She will continue the same dose of 125 mcg Reminded her to take this before breakfast daily She will follow-up in 4 months to make sure her thyroid levels are consistent  Elayne Snare 04/04/2022

## 2022-04-09 ENCOUNTER — Other Ambulatory Visit: Payer: Self-pay | Admitting: Endocrinology

## 2022-04-09 ENCOUNTER — Other Ambulatory Visit: Payer: Self-pay | Admitting: Hematology and Oncology

## 2022-04-09 ENCOUNTER — Other Ambulatory Visit: Payer: Self-pay | Admitting: Cardiology

## 2022-04-10 NOTE — Telephone Encounter (Signed)
Prescription refill request for Eliquis received. Indication:PE Last office visit:10/22 Scr:0.8 Age: 71 Weight:136.2 kg  Prescription refilled

## 2022-05-04 ENCOUNTER — Other Ambulatory Visit: Payer: Self-pay

## 2022-06-22 ENCOUNTER — Telehealth: Payer: Self-pay

## 2022-06-22 NOTE — Telephone Encounter (Signed)
Patient LVM stating that she hasnt been feeling well since she has been on Levothyroxine 125 changed from 137 mcg. Attempted to contact the patient back to receive further details/symptoms and unable to get in contact. Patient's voicemail box is full.

## 2022-06-29 ENCOUNTER — Telehealth: Payer: Self-pay | Admitting: *Deleted

## 2022-06-29 NOTE — Telephone Encounter (Signed)
Minus Breeding, MD  Cristopher Estimable, RN Phone Number: [phone]   She can come back to see an APP.        Previous Messages    ----- Message -----  From: Sheila Jacobs  Sent: 06/25/2022  12:16 PM EDT  To: Minus Breeding, MD  Subject: Palpitations + abnormal Zio                     Pt presents to our clinic with c/o palpitations. Labs and Zio patch obtained. TSH low (unsure if pt has been compliant w/ thyroid med as prescribed). We rechecked labs today, resulting pending. Will fax to your office when available. We have faxed the Zio report for your to review. Please contact us or the patient if you have any new recommendations. Just FYI, our remote Cardiologist recommended for her to stop Metoprolol based on the findings. Since she's your patient. We want  you to make that call.   Preliminary Findings Prepared by Jerl Santos, CCT 06/21/22 Final Interpretation  Patient had a min HR of 34 bpm, max HR of 152 bpm, and avg HR of 68  bpm. Predominant underlying rhythm was Sinus Rhythm. First Degree AV  Block was present. 1 run of Supraventricular Tachycardia occurred lasting  8 beats with a max rate of 152 bpm (avg 138 bpm). 1 Pause occurred  lasting 3.1 secs (20 bpm). Isolated SVEs were rare (<1.0%), and no SVE  Couplets or SVE Triplets were present. Isolated VEs were rare (<1.0%),  and no VE Couplets or VE Triplets were present.

## 2022-06-29 NOTE — Telephone Encounter (Signed)
Unable to reach pt or leave a message mailbox is full 

## 2022-07-09 NOTE — Telephone Encounter (Signed)
Unable to reach pt or leave a message mailbox is full 

## 2022-07-10 ENCOUNTER — Telehealth: Payer: Self-pay

## 2022-07-10 NOTE — Telephone Encounter (Signed)
Patient called left vm for someone to call her back. I called patient back and she stated she needed to call me back because she needs to charge hearing aids to hear me.

## 2022-07-11 ENCOUNTER — Ambulatory Visit: Payer: Medicare Other | Admitting: Endocrinology

## 2022-07-13 ENCOUNTER — Encounter: Payer: Self-pay | Admitting: *Deleted

## 2022-07-13 NOTE — Telephone Encounter (Signed)
This encounter was created in error - please disregard.

## 2022-07-13 NOTE — Telephone Encounter (Signed)
ou4 days ago   Unable to reach pt or leave a message mailbox is full      Note   You2 weeks ago    Unable to reach pt or leave a message mailbox is full      Note   You2 weeks ago   Minus Breeding, MD  Cristopher Estimable, RN Phone Number: [phone]    She can come back to see an APP.         Previous Messages    ----- Message -----  From: Ronal Fear  Sent: 06/25/2022  12:16 PM EDT  To: Minus Breeding, MD  Subject: Palpitations + abnormal Zio                     Pt presents to our clinic with c/o palpitations. Labs and Zio patch obtained. TSH low (unsure if pt has been compliant w/ thyroid med as prescribed). We rechecked labs today, resulting pending. Will fax to your office when available. We have faxed the Zio report for your to review. Please contact us or the patient if you have any new recommendations. Just FYI, our remote Cardiologist recommended for her to stop Metoprolol based on the findings. Since she's your patient. We want  you to make that call.   Preliminary Findings Prepared by Jerl Santos, CCT 06/21/22 Final Interpretation  Patient had a min HR of 34 bpm, max HR of 152 bpm, and avg HR of 68  bpm. Predominant underlying rhythm was Sinus Rhythm. First Degree AV  Block was present. 1 run of Supraventricular Tachycardia occurred lasting  8 beats with a max rate of 152 bpm (avg 138 bpm). 1 Pause occurred  lasting 3.1 secs (20 bpm). Isolated SVEs were rare (<1.0%), and no SVE  Couplets or SVE Triplets were present. Isolated VEs were rare (<1.0%),  and no VE Couplets or VE Triplets were present.

## 2022-07-29 ENCOUNTER — Encounter (HOSPITAL_COMMUNITY): Payer: Self-pay

## 2022-07-29 ENCOUNTER — Ambulatory Visit (HOSPITAL_COMMUNITY)
Admission: EM | Admit: 2022-07-29 | Discharge: 2022-07-29 | Disposition: A | Discharge status: Home / Self Care | Payer: Medicare Other | Attending: Family Medicine | Admitting: Family Medicine

## 2022-07-29 DIAGNOSIS — C50919 Malignant neoplasm of unspecified site of unspecified female breast: Secondary | ICD-10-CM | POA: Diagnosis present

## 2022-07-29 DIAGNOSIS — Z1152 Encounter for screening for COVID-19: Secondary | ICD-10-CM | POA: Diagnosis not present

## 2022-07-29 DIAGNOSIS — R200 Anesthesia of skin: Secondary | ICD-10-CM | POA: Diagnosis present

## 2022-07-29 DIAGNOSIS — E039 Hypothyroidism, unspecified: Secondary | ICD-10-CM | POA: Diagnosis present

## 2022-07-29 LAB — T4, FREE: Free T4: 1.68 ng/dL — ABNORMAL HIGH (ref 0.61–1.12)

## 2022-07-29 LAB — TSH: TSH: 0.114 u[IU]/mL — ABNORMAL LOW (ref 0.350–4.500)

## 2022-07-29 NOTE — Discharge Instructions (Addendum)
We have drawn blood work for your thyroid function numbers.  We can forward that to Dr. Dwyane Dee, as we would not be the ones to adjust your prescription.  Please see your primary care about this facial numbness  Please also see your cancer oncology doctor about this facial numbness   You have been swabbed for COVID, and the test will result in the next 24 hours. Our staff will call you if positive. If the COVID test is positive, you should quarantine for 5 days from the start of your symptoms

## 2022-07-29 NOTE — ED Provider Notes (Addendum)
[place]    CSN: [id number] Arrival date & time: 07/29/22  1155      History   Chief Complaint Chief Complaint  Patient presents with   Otalgia    HPI Sheila Jacobs is a 71 y.o. female.    Otalgia  Here for right sided face numbness has been going on for 2 months.  Initially staff thought she had said that her left ear was hurting.  Patient is hard of hearing and is difficult to get a history.  Finally we did have an understanding that her left ear is not hurting her at all.  She states that her primary care thought that maybe she had an ear infection on exam.  Her right ear and right face feels numb.  This has been going on for at least 2 months.  No syncope and no other muscle weakness noted.   Patient has a history of metastatic breast cancer.  In spring of this year she had a biopsy of an axillary lymph node and was found to have metastatic breast cancer there.  In the spring, her oncologist recommended her starting Verzenio.  On questioning, the patient states she has stopped that medication because it made her feel bad.  She has not followed up with oncology since then.  She did have an abnormal lesion on her MRI of the brain but on repeat it did not light up and appeared to be benign.  This is on her right frontal lobe.   Patient relates that at some point when her thyroid was not at the right level, she had facial numbness like this.  She has a follow-up coming up with Dr. Dwyane Dee but is persistent in questioning whether or not this was her thyroid bothering her.  Past Medical History:  Diagnosis Date   Acute saddle pulmonary embolism without acute cor pulmonale (HCC) 12/29/2017   Anginal pain (HCC)    No angiographic evidence of CAD by cath 10/21/14   Arthritis    Breast cancer of upper-outer quadrant of left female breast ([place]) 01/31/2016   Colon polyps    GERD (gastroesophageal reflux disease)    Hearing deficit    Pt reports wearing hearing  aides   Hearing loss    bilateral   History of radiation therapy 10/9-11/20/17   Left Breast 45 Gy in 25 fractions, Left Breast Boost 6 Gy in 3 fractions.    Hyperlipidemia    Hypertension    Morbid obesity (HCC)    OSA (obstructive sleep apnea) 01/23/2018   uses cpap   Palpitations 10/16/2016   Personal history of noncompliance with medical treatment, presenting hazards to health 08/27/2016   Personal history of radiation therapy    Thyroid goiter     Patient Active Problem List   Diagnosis Date Noted   Hearing deficit 02/09/2022   Multiple pulmonary nodules 10/19/2021   Heel callus 06/14/2021   Pes planus 06/14/2021   Lymphedema 03/07/2021   Pharyngoesophageal dysphagia 11/26/2019   Bilateral temporomandibular joint pain 03/05/2019   Presbycusis of both ears 03/05/2019   Medication management 10/22/2018   History of pulmonary embolus (PE), 12/2017 10/22/2018   Chronic anticoagulation 10/22/2018   Tinea corporis 08/22/2018   Right leg swelling 08/21/2018   Pain and swelling of ankle, right 04/24/2018   Cough 04/13/2018   Throat pain in adult 03/10/2018   OSA (obstructive sleep apnea) 01/23/2018   Hypothyroidism    Hypoxia    Acute saddle pulmonary embolism without acute  cor pulmonale ([place]) 12/29/2017   History of breast cancer 10/23/2017   Post-surgical hypothyroidism 06/27/2017   Thyroid goiter 02/26/2017   Personal history of noncompliance with medical treatment, presenting hazards to health 08/27/2016   Generalized anxiety disorder 08/09/2016   Abnormal TSH 05/09/2016   Abnormality of gait 01/31/2016   Low back pain 01/31/2016   Breast cancer of upper-outer quadrant of left female breast ([place]) 01/31/2016   Former heavy cigarette smoker (20-39 per day) 11/08/2015   Esophageal reflux    GERD (gastroesophageal reflux disease) 05/03/2015   Dependent edema 05/03/2015   Morbid obesity ([place]) 04/29/2015   Right carotid bruit 10/06/2014   Atypical chest pain 09/18/2014    Hypertension 09/18/2014   Hyperlipidemia 09/18/2014   Enlarged LA (left [facility])     Past Surgical History:  Procedure Laterality Date   BREAST BIOPSY Left 01/2016   BREAST LUMPECTOMY Left 04/2016   BREAST LUMPECTOMY WITH RADIOACTIVE SEED AND SENTINEL LYMPH NODE BIOPSY Left 04/11/2016   Procedure: BREAST LUMPECTOMY WITH RADIOACTIVE SEED AND SENTINEL LYMPH NODE BIOPSY;  Surgeon: Excell [facility], MD;  Location: [place];  Service: General;  Laterality: Left;   BRONCHIAL BIOPSY  11/14/2021   Procedure: BRONCHIAL BIOPSIES;  Surgeon: Garner Nash, DO;  Location: [facility];  Service: Pulmonary;;   BRONCHIAL BRUSHINGS  11/14/2021   Procedure: BRONCHIAL BRUSHINGS;  Surgeon: Garner Nash, DO;  Location: [place];  Service: Pulmonary;;   BRONCHIAL NEEDLE ASPIRATION BIOPSY  11/14/2021   Procedure: BRONCHIAL NEEDLE ASPIRATION BIOPSIES;  Surgeon: Garner Nash, DO;  Location: [place];  Service: Pulmonary;;   COLONOSCOPY WITH PROPOFOL N/A 10/20/2015   Procedure: COLONOSCOPY WITH PROPOFOL;  Surgeon: Mauri Pole, MD;  Location: [facility];  Service: Endoscopy;  Laterality: N/A;   ESOPHAGOGASTRODUODENOSCOPY (EGD) WITH PROPOFOL N/A 10/20/2015   Procedure: ESOPHAGOGASTRODUODENOSCOPY (EGD) WITH PROPOFOL;  Surgeon: Mauri Pole, MD;  Location: [facility];  Service: Endoscopy;  Laterality: N/A;   IR ANGIOGRAM PULMONARY BILATERAL SELECTIVE  12/29/2017   IR ANGIOGRAM SELECTIVE EACH ADDITIONAL VESSEL  12/29/2017   IR ANGIOGRAM SELECTIVE EACH ADDITIONAL VESSEL  12/29/2017   IR INFUSION THROMBOL ARTERIAL INITIAL (MS)  12/29/2017   IR INFUSION THROMBOL ARTERIAL INITIAL (MS)  12/29/2017   IR THROMB F/U EVAL ART/VEN FINAL DAY (MS)  12/30/2017   IR US GUIDE VASC ACCESS RIGHT  12/29/2017   LEFT HEART CATHETERIZATION WITH CORONARY ANGIOGRAM N/A 10/21/2014   Procedure: LEFT HEART CATHETERIZATION WITH CORONARY ANGIOGRAM;  Surgeon: Burnell Blanks, MD;  Location: [facility];  Service:  Cardiovascular;  Laterality: N/A;   THYROIDECTOMY N/A 02/26/2017   Procedure: TOTAL THYROIDECTOMY;  Surgeon: Excell Seltzer, MD;  Location: [facility];  Service: General;  Laterality: N/A;   TUBAL LIGATION     VIDEO BRONCHOSCOPY WITH RADIAL ENDOBRONCHIAL ULTRASOUND  11/14/2021   Procedure: RADIAL ENDOBRONCHIAL ULTRASOUND;  Surgeon: Garner Nash, DO;  Location: [facility];  Service: Pulmonary;;    OB History   No obstetric history on file.      Home Medications    Prior to Admission medications   Medication Sig Start Date End Date Taking? Authorizing Provider  abemaciclib (VERZENIO) 100 MG tablet Take 1 tablet (100 mg total) by mouth 2 (two) times daily. Swallow tablets whole. Do not chew, crush, or split tablets before swallowing. 12/12/21   Nicholas Lose, MD  acetaminophen (TYLENOL) 500 MG tablet Take 500 mg by mouth every 6 (six) hours as needed for moderate pain or headache.    [provider]  Cholecalciferol (VITAMIN D) 125 MCG (5000 UT) CAPS Take 5,000 Units by mouth daily.    [provider]  ELIQUIS 5 MG TABS tablet TAKE 1 TABLET BY MOUTH TWICE A DAY 04/10/22   Minus Breeding, MD  Ferrous Gluconate (IRON 27 PO) Take 1 tablet by mouth 2 (two) times daily.    [provider]  gabapentin (NEURONTIN) 100 MG capsule Take 100 mg by mouth at bedtime. 08/28/20   [provider]  hydrochlorothiazide (HYDRODIURIL) 25 MG tablet Take 1 tablet (25 mg total) by mouth daily. 07/17/18   Kathrene Alu, MD  letrozole (FEMARA) 2.5 MG tablet Take 1 tablet (2.5 mg total) by mouth daily. 12/05/21   Nicholas Lose, MD  levothyroxine (SYNTHROID) 125 MCG tablet Take 1 tablet (125 mcg total) by mouth daily before breakfast. 04/04/22   Elayne Snare, MD  metoprolol succinate (TOPROL-XL) 50 MG 24 hr tablet Take 50 mg by mouth daily. 06/22/20   [provider]  nitroGLYCERIN (NITROSTAT) 0.4 MG SL tablet 0.4 mg every 5 (five) minutes as needed for chest pain. 07/17/21    [provider]  omeprazole (PRILOSEC) 20 MG capsule Take 20 mg by mouth daily.    [provider]  triamcinolone cream (KENALOG) 0.1 % 1 application daily as needed (Rash). 07/24/21   [provider]  vitamin B-12 (CYANOCOBALAMIN) 1000 MCG tablet Take 1,000 mcg by mouth daily.    [provider]    Family History Family History  Problem Relation Age of Onset   Hypertension Mother    Heart disease Mother        Pacemaker   Kidney disease Mother    Cataracts Mother    Hearing loss Mother    Diabetes Mother    Liver disease Mother    Diabetes Sister    Cataracts Brother    Hearing loss Brother    Colon cancer Neg Hx    Thyroid disease Neg Hx     Social History Social History   Tobacco Use   Smoking status: Former    Packs/day: 1.00    Years: 45.00    Total pack years: 45.00    Types: Cigarettes    Quit date: 07/11/2014    Years since quitting: 8.0   Smokeless tobacco: Never  Vaping Use   Vaping Use: Never used  Substance Use Topics   Alcohol use: No    Alcohol/week: 0.0 standard drinks of alcohol   Drug use: No     Allergies   Cortisone, Effexor [venlafaxine], Lexapro [escitalopram], and Xarelto [rivaroxaban]   Review of Systems Review of Systems  HENT:  Positive for ear pain.      Physical Exam Triage Vital Signs ED Triage Vitals [07/29/22 1306]  Enc Vitals Group     BP 138/63     Pulse Rate 81     Resp 16     Temp 98.7 F (37.1 C)     Temp Source Oral     SpO2 96 %     Weight      Height      Head Circumference      Peak Flow      Pain Score 0     Pain Loc      Pain Edu?      Excl. in [place]?    No data found.  Updated Vital Signs BP 138/63 (BP Location: Right Arm)   Pulse 81   Temp 98.7 F (37.1 C) (Oral)  Resp 16   SpO2 96%   Visual Acuity Right Eye Distance:   Left Eye Distance:   Bilateral Distance:    Right Eye Near:   Left Eye Near:    Bilateral Near:     Physical Exam Vitals  reviewed.  Constitutional:      General: She is not in acute distress.    Appearance: She is not ill-appearing, toxic-appearing or diaphoretic.  HENT:     Right Ear: Tympanic membrane and ear canal normal.     Left Ear: Tympanic membrane and ear canal normal.     Nose: Nose normal.     Mouth/Throat:     Mouth: Mucous membranes are moist.  Eyes:     Extraocular Movements: Extraocular movements intact.     Conjunctiva/sclera: Conjunctivae normal.     Pupils: Pupils are equal, round, and reactive to light.  Cardiovascular:     Rate and Rhythm: Normal rate and regular rhythm.     Heart sounds: No murmur heard. Pulmonary:     Effort: Pulmonary effort is normal.     Breath sounds: Normal breath sounds.  Musculoskeletal:     Cervical back: Neck supple.  Lymphadenopathy:     Cervical: No cervical adenopathy.  Skin:    Coloration: Skin is not pale.  Neurological:     General: No focal deficit present.     Mental Status: She is alert and oriented to person, place, and time.     Comments: There is no facial weakness, central or peripheral.  There is no facial droop.  Neurologic exam is afocal  Psychiatric:        Behavior: Behavior normal.      UC Treatments / Results  Labs (all labs ordered are listed, but only abnormal results are displayed) Labs Reviewed  TSH  T4, FREE    EKG   Radiology No results found.  Procedures Procedures (including critical care time)  Medications Ordered in UC Medications - No data to display  Initial Impression / Assessment and Plan / UC Course  I have reviewed the triage vital signs and the nursing notes.  Pertinent labs & imaging results that were available during my care of the patient were reviewed by me and considered in my medical decision making (see chart for details).         To satisfy the patient's request, we are going to draw blood work for thyroid function.  If not in range, we will forward the blood work to Dr.  Dwyane Dee.  I have asked the patient to please, please see her oncologist.  Also she could follow-up with her primary care about the facial numbness  Apparently patient is requesting COVID test due to her ear popping.  COVID swab ordered Final Clinical Impressions(s) / UC Diagnoses   Final diagnoses:  Facial numbness  Acquired hypothyroidism  Malignant neoplasm of female breast, unspecified estrogen receptor status, unspecified laterality, unspecified site of breast [facility])     Discharge Instructions      We have drawn blood work for your thyroid function numbers.  We can forward that to Dr. Dwyane Dee, as we would not be the ones to adjust your prescription.  Please see your primary care about this facial numbness  Please also see your cancer oncology doctor about this facial numbness     ED Prescriptions   None    PDMP not reviewed this encounter.   Barrett Henle, MD 07/29/22 1333    Barrett Henle, MD  07/29/22 1346  

## 2022-07-29 NOTE — ED Triage Notes (Signed)
Pt states left ear pain for over a week, also states numbness to the right side of her face for more than two months.

## 2022-08-01 ENCOUNTER — Telehealth (HOSPITAL_COMMUNITY): Payer: Self-pay | Admitting: Emergency Medicine

## 2022-08-01 MED ORDER — LEVOTHYROXINE SODIUM 100 MCG PO TABS: 100.0000 ug | ORAL_TABLET | Freq: Every day | ORAL | 0 refills | Status: DC

## 2022-08-03 LAB — SARS CORONAVIRUS 2 (TAT 6-24 HRS): SARS Coronavirus 2: NEGATIVE

## 2022-08-17 ENCOUNTER — Ambulatory Visit: Payer: Medicare Other | Admitting: Endocrinology

## 2022-08-28 ENCOUNTER — Ambulatory Visit: Payer: Medicare Other | Admitting: Endocrinology

## 2022-09-06 ENCOUNTER — Telehealth: Payer: Self-pay | Admitting: Cardiology

## 2022-09-06 NOTE — Telephone Encounter (Signed)
Returned call to daughter Kera Deacon, she states that pt has told her that the medicine that Dr [name] gave her is causing hearing loss. Upon further questioning [name] told me the patient told her it was her Thyroid medication. I noticed that there is a referral to [facility]  to check her ears and Left otitis media. I informed daughter of this and suggested to wait and she what they say and make sure to discuss this at that appointment, verbalized understanding. She will discuss this with the patient.

## 2022-09-06 NOTE — Telephone Encounter (Signed)
Pt daughter called stating that ever since pt has been taking a medication given by Dr. [name] she cannot hear out of her ears. She was unaware of what medication it is and stated she will c/b once she speaks to pt again.

## 2022-09-19 ENCOUNTER — Other Ambulatory Visit: Payer: Self-pay

## 2022-09-19 ENCOUNTER — Emergency Department (HOSPITAL_COMMUNITY)
Admission: EM | Admit: 2022-09-19 | Discharge: 2022-09-19 | Disposition: A | Discharge status: Home / Self Care | Payer: 59 | Attending: Emergency Medicine | Admitting: Emergency Medicine

## 2022-09-19 DIAGNOSIS — Z79899 Other long term (current) drug therapy: Secondary | ICD-10-CM | POA: Diagnosis not present

## 2022-09-19 DIAGNOSIS — E876 Hypokalemia: Secondary | ICD-10-CM

## 2022-09-19 DIAGNOSIS — I10 Essential (primary) hypertension: Secondary | ICD-10-CM | POA: Insufficient documentation

## 2022-09-19 DIAGNOSIS — R799 Abnormal finding of blood chemistry, unspecified: Secondary | ICD-10-CM | POA: Diagnosis present

## 2022-09-19 LAB — CBC
HCT: 38.2 % (ref 36.0–46.0)
Hemoglobin: 12.7 g/dL (ref 12.0–15.0)
MCH: 32.2 pg (ref 26.0–34.0)
MCHC: 33.2 g/dL (ref 30.0–36.0)
MCV: 97 fL (ref 80.0–100.0)
Platelets: 225 10*3/uL (ref 150–400)
RBC: 3.94 MIL/uL (ref 3.87–5.11)
RDW: 14.1 % (ref 11.5–15.5)
WBC: 4.4 10*3/uL (ref 4.0–10.5)
nRBC: 0.5 % — ABNORMAL HIGH (ref 0.0–0.2)

## 2022-09-19 LAB — BASIC METABOLIC PANEL
Anion gap: 15 (ref 5–15)
BUN: <5 mg/dL — ABNORMAL LOW (ref 8–23)
CO2: 27 mmol/L (ref 22–32)
Calcium: 9.7 mg/dL (ref 8.9–10.3)
Chloride: 96 mmol/L — ABNORMAL LOW (ref 98–111)
Creatinine, Ser: 0.79 mg/dL (ref 0.44–1.00)
GFR, Estimated: >60 mL/min (ref >60)
Glucose, Bld: 104 mg/dL — ABNORMAL HIGH (ref 70–99)
Potassium: 2.4 mmol/L — CL (ref 3.5–5.1)
Sodium: 138 mmol/L (ref 135–145)

## 2022-09-19 LAB — MAGNESIUM: Magnesium: 2 mg/dL (ref 1.7–2.4)

## 2022-09-19 MED ORDER — POTASSIUM CHLORIDE CRYS ER 20 MEQ PO TBCR
20.0000 meq | EXTENDED_RELEASE_TABLET | ORAL | Status: AC
  Administered 2022-09-19: 20 meq via ORAL
  Filled 2022-09-19: qty 1

## 2022-09-19 MED ORDER — POTASSIUM CHLORIDE CRYS ER 20 MEQ PO TBCR: 40.0000 meq | EXTENDED_RELEASE_TABLET | Freq: Once | ORAL | Status: DC

## 2022-09-19 MED ORDER — POTASSIUM CHLORIDE CRYS ER 20 MEQ PO TBCR: 20.0000 meq | EXTENDED_RELEASE_TABLET | Freq: Two times a day (BID) | ORAL | 0 refills | Status: AC

## 2022-09-19 MED ORDER — POTASSIUM CHLORIDE 10 MEQ/100ML IV SOLN
10.0000 meq | INTRAVENOUS | Status: AC
  Administered 2022-09-19 (×2): 10 meq via INTRAVENOUS
  Filled 2022-09-19 (×2): qty 100

## 2022-09-19 MED ORDER — AMLODIPINE BESYLATE 5 MG PO TABS: 5.0000 mg | ORAL_TABLET | Freq: Every day | ORAL | 0 refills | Status: AC

## 2022-09-19 MED ORDER — POTASSIUM CHLORIDE 20 MEQ PO PACK
60.0000 meq | PACK | ORAL | Status: AC
  Administered 2022-09-19: 60 meq via ORAL
  Filled 2022-09-19: qty 3

## 2022-09-19 NOTE — ED Triage Notes (Signed)
Patient referred to ED by PCP for evaluation of hypokalemia, patient unsure of lab value. Patient complains of generalized weakness, states she takes hydrochlorothiazide.

## 2022-09-19 NOTE — ED Provider Notes (Signed)
[facility] EMERGENCY DEPARTMENT Provider Note   CSN: [id number] Arrival date & time: 09/19/22  1213     History  Chief Complaint  Patient presents with   Abnormal Lab    Sheila Jacobs is a 72 y.o. female.  72 year old female with history of hypertension on hydrochlorothiazide who presents to the emergency department with abnormal labs.  Patient reports that on Monday she had labs drawn at her primary doctors that showed low potassium and was referred into the emergency department.  Was previously on potassium supplementation and stopped but is unsure when she stopped this medication.  Has been taking her hydrochlorothiazide 25 mg daily recently.  No nausea or vomiting or diarrhea.  No significant muscle cramps in her arms or legs.  Thinks she may have jaw cramps occasionally.  No syncope or palpitations.  No recent illnesses.       Home Medications Prior to Admission medications   Medication Sig Start Date End Date Taking? Authorizing Provider  amLODipine (NORVASC) 5 MG tablet Take 1 tablet (5 mg total) by mouth daily. 09/19/22 10/19/22 Yes Rondel Baton, MD  potassium chloride SA (KLOR-CON M) 20 MEQ tablet Take 1 tablet (20 mEq total) by mouth 2 (two) times daily for 4 days. 09/19/22 09/23/22 Yes Rondel Baton, MD  abemaciclib (VERZENIO) 100 MG tablet Take 1 tablet (100 mg total) by mouth 2 (two) times daily. Swallow tablets whole. Do not chew, crush, or split tablets before swallowing. 12/12/21   Serena Croissant, MD  acetaminophen (TYLENOL) 500 MG tablet Take 500 mg by mouth every 6 (six) hours as needed for moderate pain or headache.    [provider]  Cholecalciferol (VITAMIN D) 125 MCG (5000 UT) CAPS Take 5,000 Units by mouth daily.    [provider]  ELIQUIS 5 MG TABS tablet TAKE 1 TABLET BY MOUTH TWICE A DAY 04/10/22   Rollene Rotunda, MD  Ferrous Gluconate (IRON 27 PO) Take 1 tablet by mouth 2 (two) times daily.    [provider]  gabapentin (NEURONTIN) 100 MG capsule Take 100 mg by mouth at bedtime. 08/28/20   [provider]  letrozole (FEMARA) 2.5 MG tablet Take 1 tablet (2.5 mg total) by mouth daily. 12/05/21   Serena Croissant, MD  levothyroxine (SYNTHROID) 100 MCG tablet Take 1 tablet (100 mcg total) by mouth daily before breakfast. 08/01/22   Zenia Resides, MD  metoprolol succinate (TOPROL-XL) 50 MG 24 hr tablet Take 50 mg by mouth daily. 06/22/20   [provider]  nitroGLYCERIN (NITROSTAT) 0.4 MG SL tablet 0.4 mg every 5 (five) minutes as needed for chest pain. 07/17/21   [provider]  omeprazole (PRILOSEC) 20 MG capsule Take 20 mg by mouth daily.    [provider]  triamcinolone cream (KENALOG) 0.1 % 1 application daily as needed (Rash). 07/24/21   [provider]  vitamin B-12 (CYANOCOBALAMIN) 1000 MCG tablet Take 1,000 mcg by mouth daily.    [provider]      Allergies    Cortisone, Effexor [venlafaxine], Lexapro [escitalopram], and Xarelto [rivaroxaban]    Review of Systems   Review of Systems  Physical Exam Updated Vital Signs BP 132/76   Pulse 63   Temp 98 F (36.7 C) (Oral)   Resp 16   Ht 5\' 3"  (1.6 m)   Wt 112 kg   SpO2 96%   BMI 43.74 kg/m  Physical Exam Vitals and nursing note reviewed.  Constitutional:  General: She is not in acute distress.    Appearance: She is well-developed.  HENT:     Head: Normocephalic and atraumatic.  Eyes:     Conjunctiva/sclera: Conjunctivae normal.  Cardiovascular:     Rate and Rhythm: Normal rate and regular rhythm.     Heart sounds: No murmur heard. Pulmonary:     Effort: Pulmonary effort is normal. No respiratory distress.     Breath sounds: Normal breath sounds.  Abdominal:     General: There is no distension.     Palpations: Abdomen is soft. There is no mass.     Tenderness: There is no abdominal tenderness. There is no guarding.  Musculoskeletal:        General:  No swelling.     Cervical back: Neck supple.  Skin:    General: Skin is warm and dry.     Capillary Refill: Capillary refill takes less than 2 seconds.  Neurological:     Mental Status: She is alert.  Psychiatric:        Mood and Affect: Mood normal.     ED Results / Procedures / Treatments   Labs (all labs ordered are listed, but only abnormal results are displayed) Labs Reviewed  BASIC METABOLIC PANEL - Abnormal; Notable for the following components:      Result Value   Potassium 2.4 (*)    Chloride 96 (*)    Glucose, Bld 104 (*)    BUN <5 (*)    All other components within normal limits  CBC - Abnormal; Notable for the following components:   nRBC 0.5 (*)    All other components within normal limits  MAGNESIUM    EKG EKG Interpretation  Date/Time:  Wednesday September 19 2022 13:26:58 EST Ventricular Rate:  71 PR Interval:  196 QRS Duration: 104 QT Interval:  412 QTC Calculation: 447 R Axis:   -49 Text Interpretation: Normal sinus rhythm Left anterior fascicular block Cannot rule out Anterior infarct , age undetermined Abnormal ECG When compared with ECG of 09-Feb-2022 13:10, PREVIOUS ECG IS PRESENT Confirmed by Vonita Moss [phone]) on 09/19/2022 4:14:56 PM  Radiology No results found.  Procedures Procedures   Medications Ordered in ED Medications  potassium chloride 10 mEq in 100 mL IVPB (0 mEq Intravenous Stopped 09/19/22 1954)  potassium chloride (KLOR-CON) packet 60 mEq (60 mEq Oral Given 09/19/22 1656)  potassium chloride SA (KLOR-CON M) CR tablet 20 mEq (20 mEq Oral Given 09/19/22 2026)    ED Course/ Medical Decision Making/ A&P                           Medical Decision Making Amount and/or Complexity of Data Reviewed Labs: ordered.  Risk Prescription drug management.   Sheila Jacobs is a 72 y.o. female with comorbidities that complicate the patient evaluation including hypertension on hydrochlorothiazide who presents emergency department  with low potassium on outpatient labs  Initial Ddx:  Hypokalemia, hypomagnesemia, GI losses, hydrochlorothiazide side effect  MDM:  Feel that patient likely has hypokalemia from persistent use of her hydrochlorothiazide without potassium supplementation.  No GI losses that would explain this lab value.  Will assess for hypomagnesemia as well.  Overall not symptomatic and feel that she potentially could be discharged after supplementation here in the emergency department.  Plan:  Labs Magnesium IV potassium and oral potassium  ED Summary/Re-evaluation:  Patient reevaluated and was stable.  Was given 2 IV rounds of potassium as well  as 2 rounds of oral potassium.  EKG without significant QT prolongation or U waves.  Magnesium was WNL.  Patient's renal function is normal so we will send her home on several days of oral potassium and have her change from the hydrochlorothiazide to amlodipine.  Check to the patient to follow-up with her primary doctor for repeat lab work.  This patient presents to the ED for concern of complaints listed in HPI, this involves an extensive number of treatment options, and is a complaint that carries with it a high risk of complications and morbidity. Disposition including potential need for admission considered.   Dispo: DC Home. Return precautions discussed including, but not limited to, those listed in the AVS. Allowed pt time to ask questions which were answered fully prior to dc.  Additional history obtained from family Records reviewed Outpatient Clinic Notes The following labs were independently interpreted: Chemistry and show  hypokalemia I personally reviewed and interpreted cardiac monitoring: normal sinus rhythm  I personally reviewed and interpreted the pt's EKG: see above for interpretation  I have reviewed the patients home medications and made adjustments as needed Social Determinants of health:  Elderly  Final Clinical Impression(s) / ED  Diagnoses Final diagnoses:  Hypokalemia    Rx / DC Orders ED Discharge Orders          Ordered    potassium chloride SA (KLOR-CON M) 20 MEQ tablet  2 times daily        09/19/22 1906    amLODipine (NORVASC) 5 MG tablet  Daily        09/19/22 1906           CRITICAL CARE Performed by: Rondel Baton   Total critical care time: 30 minutes  Critical care time was exclusive of separately billable procedures and treating other patients.  Critical care was necessary to treat or prevent imminent or life-threatening deterioration.  Critical care was time spent personally by me on the following activities: development of treatment plan with patient and/or surrogate as well as nursing, discussions with consultants, evaluation of patient's response to treatment, examination of patient, obtaining history from patient or surrogate, ordering and performing treatments and interventions, ordering and review of laboratory studies, ordering and review of radiographic studies, pulse oximetry and re-evaluation of patient's condition.    Rondel Baton, MD 09/20/22 1024

## 2022-09-19 NOTE — ED Provider Triage Note (Signed)
Emergency Medicine Provider Triage Evaluation Note  Sheila Jacobs , a 72 y.o. female  was evaluated in triage.  Pt complains of hypokalemia.  Patient states has history of this and used to take potassium supplements however, not currently taking.  Admits to generalized weakness.  Hard of hearing and her hearing aids are not currently working..  Review of Systems  Positive: weakness Negative: fever  Physical Exam  BP (!) 147/80 (BP Location: Right Arm)   Pulse 82   Temp 98.4 F (36.9 C) (Oral)   Resp 16   SpO2 95%  Gen:   Awake, no distress   Resp:  Normal effort  MSK:   Moves extremities without difficulty  Other:    Medical Decision Making  Medically screening exam initiated at 1:16 PM.  Appropriate orders placed.  HAJRA PORT was informed that the remainder of the evaluation will be completed by another provider, this initial triage assessment does not replace that evaluation, and the importance of remaining in the ED until their evaluation is complete.  Repeat labs to recheck potassium   Suzy Bouchard, PA-C 09/19/22 1317

## 2022-09-19 NOTE — Discharge Instructions (Addendum)
You were seen for your low potassium in the emergency department.   At home, please take the potassium we have prescribed you and stop taking the hydrochlorothiazide.  Start taking amlodipine for your blood pressure in the meantime.    Follow-up with your primary doctor in 3 days regarding your visit and for repeat blood work.  Return immediately to the emergency department if you experience any of the following: Palpitations, fainting, or any other concerning symptoms.    Thank you for visiting our Emergency Department. It was a pleasure taking care of you today.

## 2022-09-28 ENCOUNTER — Other Ambulatory Visit: Payer: Self-pay | Admitting: Physician Assistant

## 2022-09-28 DIAGNOSIS — G518 Other disorders of facial nerve: Secondary | ICD-10-CM

## 2022-09-28 DIAGNOSIS — H9201 Otalgia, right ear: Secondary | ICD-10-CM

## 2022-10-10 ENCOUNTER — Emergency Department (HOSPITAL_COMMUNITY)
Admission: EM | Admit: 2022-10-10 | Discharge: 2022-10-11 | Disposition: A | Discharge status: Home / Self Care | Payer: 59 | Attending: Emergency Medicine | Admitting: Emergency Medicine

## 2022-10-10 ENCOUNTER — Other Ambulatory Visit: Payer: Self-pay

## 2022-10-10 DIAGNOSIS — Z955 Presence of coronary angioplasty implant and graft: Secondary | ICD-10-CM | POA: Insufficient documentation

## 2022-10-10 DIAGNOSIS — I2510 Atherosclerotic heart disease of native coronary artery without angina pectoris: Secondary | ICD-10-CM | POA: Insufficient documentation

## 2022-10-10 DIAGNOSIS — R109 Unspecified abdominal pain: Secondary | ICD-10-CM | POA: Diagnosis not present

## 2022-10-10 DIAGNOSIS — R519 Headache, unspecified: Secondary | ICD-10-CM | POA: Diagnosis not present

## 2022-10-10 DIAGNOSIS — K219 Gastro-esophageal reflux disease without esophagitis: Secondary | ICD-10-CM | POA: Insufficient documentation

## 2022-10-10 DIAGNOSIS — Z853 Personal history of malignant neoplasm of breast: Secondary | ICD-10-CM | POA: Diagnosis not present

## 2022-10-10 DIAGNOSIS — R112 Nausea with vomiting, unspecified: Secondary | ICD-10-CM | POA: Insufficient documentation

## 2022-10-10 DIAGNOSIS — R634 Abnormal weight loss: Secondary | ICD-10-CM | POA: Insufficient documentation

## 2022-10-10 DIAGNOSIS — I10 Essential (primary) hypertension: Secondary | ICD-10-CM | POA: Diagnosis not present

## 2022-10-10 DIAGNOSIS — Z87891 Personal history of nicotine dependence: Secondary | ICD-10-CM | POA: Diagnosis not present

## 2022-10-10 DIAGNOSIS — N3000 Acute cystitis without hematuria: Secondary | ICD-10-CM

## 2022-10-10 LAB — CBC WITH DIFFERENTIAL/PLATELET
Abs Immature Granulocytes: 0.05 10*3/uL (ref 0.00–0.07)
Basophils Absolute: 0.1 10*3/uL (ref 0.0–0.1)
Basophils Relative: 1 %
Eosinophils Absolute: 0 10*3/uL (ref 0.0–0.5)
Eosinophils Relative: 1 %
HCT: 43.8 % (ref 36.0–46.0)
Hemoglobin: 15.2 g/dL — ABNORMAL HIGH (ref 12.0–15.0)
Immature Granulocytes: 1 %
Lymphocytes Relative: 20 %
Lymphs Abs: 1.1 10*3/uL (ref 0.7–4.0)
MCH: 33.9 pg (ref 26.0–34.0)
MCHC: 34.7 g/dL (ref 30.0–36.0)
MCV: 97.8 fL (ref 80.0–100.0)
Monocytes Absolute: 0.5 10*3/uL (ref 0.1–1.0)
Monocytes Relative: 9 %
Neutro Abs: 3.8 10*3/uL (ref 1.7–7.7)
Neutrophils Relative %: 68 %
Platelets: 211 10*3/uL (ref 150–400)
RBC: 4.48 MIL/uL (ref 3.87–5.11)
RDW: 14.6 % (ref 11.5–15.5)
WBC: 5.5 10*3/uL (ref 4.0–10.5)
nRBC: 0.7 % — ABNORMAL HIGH (ref 0.0–0.2)

## 2022-10-10 LAB — COMPREHENSIVE METABOLIC PANEL
ALT: 38 U/L (ref 0–44)
AST: 74 U/L — ABNORMAL HIGH (ref 15–41)
Albumin: 4 g/dL (ref 3.5–5.0)
Alkaline Phosphatase: 255 U/L — ABNORMAL HIGH (ref 38–126)
Anion gap: 24 — ABNORMAL HIGH (ref 5–15)
BUN: 16 mg/dL (ref 8–23)
CO2: 21 mmol/L — ABNORMAL LOW (ref 22–32)
Calcium: 11.4 mg/dL — ABNORMAL HIGH (ref 8.9–10.3)
Chloride: 100 mmol/L (ref 98–111)
Creatinine, Ser: 1.16 mg/dL — ABNORMAL HIGH (ref 0.44–1.00)
GFR, Estimated: 50 mL/min — ABNORMAL LOW (ref >60)
Glucose, Bld: 128 mg/dL — ABNORMAL HIGH (ref 70–99)
Potassium: 3.5 mmol/L (ref 3.5–5.1)
Sodium: 145 mmol/L (ref 135–145)
Total Bilirubin: 1.4 mg/dL — ABNORMAL HIGH (ref 0.3–1.2)
Total Protein: 8.4 g/dL — ABNORMAL HIGH (ref 6.5–8.1)

## 2022-10-10 LAB — LIPASE, BLOOD: Lipase: 54 U/L — ABNORMAL HIGH (ref 11–51)

## 2022-10-10 NOTE — ED Triage Notes (Signed)
Pt c/o nausea, vomiting, dry nonproductive cough, intermittent shortness of breath and abdominal pain for past week. States here nausea has improved following medication. No vomiting in triage. Denies sick exposure.

## 2022-10-10 NOTE — ED Provider Triage Note (Signed)
Emergency Medicine Provider Triage Evaluation Note  Sheila Jacobs , a 72 y.o. female  was evaluated in triage.  Pt complains of abdominal pain, nausea and vomiting.  Symptoms have been present for 2 weeks.  They are intermittent.  Also complains of dry cough and intermittent shortness of breath.  Was given IM Zofran by EMS and reports that her abdominal pain has improved.  Patient has difficulty hearing and has not brought her hearing aid.  Much of the history is obtained via writing on paper.  Review of Systems  Positive: As above Negative: As above  Physical Exam  BP 118/76   Pulse (!) 104   Temp (!) 97.1 F (36.2 C)   Resp 16   SpO2 98%  Gen:   Awake, no distress   Resp:  Normal effort  MSK:   Moves extremities without difficulty  Other:  No abdominal TTP  Medical Decision Making  Medically screening exam initiated at 8:35 PM.  Appropriate orders placed.  Sheila Jacobs was informed that the remainder of the evaluation will be completed by another provider, this initial triage assessment does not replace that evaluation, and the importance of remaining in the ED until their evaluation is complete.  Abdominal pain labs ordered   Sheila Jacobs 10/10/22 2036

## 2022-10-10 NOTE — ED Triage Notes (Signed)
EMS report. Pt c/o NV x 2 weeks. Evaluated for same in ED for 1 week ago. Symptoms have returned. Pt vomiting in route was given 4 mg Zofran IM.

## 2022-10-10 NOTE — ED Notes (Signed)
Pt is hard of hearing. Did not bring her hearing aids with her. Pt to lobby awaiting room assignment. Sort RN Psychologist, prison and probation services informed.

## 2022-10-11 ENCOUNTER — Encounter (HOSPITAL_COMMUNITY): Payer: Self-pay

## 2022-10-11 ENCOUNTER — Emergency Department (HOSPITAL_COMMUNITY): Payer: 59

## 2022-10-11 DIAGNOSIS — R112 Nausea with vomiting, unspecified: Secondary | ICD-10-CM | POA: Diagnosis not present

## 2022-10-11 LAB — BASIC METABOLIC PANEL
Anion gap: 16 — ABNORMAL HIGH (ref 5–15)
BUN: 16 mg/dL (ref 8–23)
CO2: 23 mmol/L (ref 22–32)
Calcium: 10.1 mg/dL (ref 8.9–10.3)
Chloride: 106 mmol/L (ref 98–111)
Creatinine, Ser: 0.94 mg/dL (ref 0.44–1.00)
GFR, Estimated: >60 mL/min (ref >60)
Glucose, Bld: 109 mg/dL — ABNORMAL HIGH (ref 70–99)
Potassium: 4.9 mmol/L (ref 3.5–5.1)
Sodium: 145 mmol/L (ref 135–145)

## 2022-10-11 LAB — URINALYSIS, ROUTINE W REFLEX MICROSCOPIC
Bilirubin Urine: NEGATIVE
Glucose, UA: NEGATIVE mg/dL
Hgb urine dipstick: NEGATIVE
Ketones, ur: 20 mg/dL — AB
Leukocytes,Ua: NEGATIVE
Nitrite: POSITIVE — AB
Protein, ur: NEGATIVE mg/dL
Specific Gravity, Urine: 1.04 — ABNORMAL HIGH (ref 1.005–1.030)
pH: 7 (ref 5.0–8.0)

## 2022-10-11 LAB — TROPONIN I (HIGH SENSITIVITY)
Troponin I (High Sensitivity): 21 ng/L — ABNORMAL HIGH (ref <18)
Troponin I (High Sensitivity): 20 ng/L — ABNORMAL HIGH (ref <18)

## 2022-10-11 MED ORDER — IOHEXOL 350 MG/ML SOLN
75.0000 mL | Freq: Once | INTRAVENOUS | Status: AC | PRN
  Administered 2022-10-11: 75 mL via INTRAVENOUS

## 2022-10-11 MED ORDER — ONDANSETRON HCL 4 MG/2ML IJ SOLN
4.0000 mg | Freq: Once | INTRAMUSCULAR | Status: AC
  Administered 2022-10-11: 4 mg via INTRAVENOUS
  Filled 2022-10-11: qty 2

## 2022-10-11 MED ORDER — ONDANSETRON 4 MG PO TBDP: 4.0000 mg | ORAL_TABLET | Freq: Three times a day (TID) | ORAL | 0 refills | Status: DC | PRN

## 2022-10-11 MED ORDER — SODIUM CHLORIDE 0.9 % IV BOLUS
500.0000 mL | Freq: Once | INTRAVENOUS | Status: AC
  Administered 2022-10-11: 500 mL via INTRAVENOUS

## 2022-10-11 MED ORDER — ONDANSETRON 4 MG PO TBDP
4.0000 mg | ORAL_TABLET | Freq: Once | ORAL | Status: AC
  Administered 2022-10-11: 4 mg via ORAL
  Filled 2022-10-11: qty 1

## 2022-10-11 MED ORDER — FOSFOMYCIN TROMETHAMINE 3 G PO PACK
3.0000 g | PACK | Freq: Once | ORAL | Status: AC
  Administered 2022-10-11: 3 g via ORAL
  Filled 2022-10-11: qty 3

## 2022-10-11 NOTE — ED Notes (Signed)
Pt denies needing to void at this time. Purewick being placed on the pt.

## 2022-10-11 NOTE — ED Notes (Signed)
Pt brought back to triage for reoccurance of nausea. Zofran given to pt. Pt returns to lobby.

## 2022-10-11 NOTE — ED Notes (Signed)
Pt taken CT

## 2022-10-11 NOTE — ED Provider Notes (Signed)
Patient is 72 years old, signed out to me at 7 AM with nausea and vomiting for the last few weeks.  History of CAD, PE on Eliquis, breast cancer, hypertension, high cholesterol she has had CT imaging of her head and abdomen pelvis that are unremarkable.  Sounds like she has metastatic cancer now but not undergoing any treatment.  EKG reassuring.  Blood work thus far reassuring except for mild AKI.  Given IV fluids, pending troponin, urinalysis and reevaluation.  Lab work and images unremarkable.  Troponin stable at 20.  UTI possibly with positive nitrites but not much leukocytes.  Treated with a dose of fosfomycin.  Urine culture sent.  She is able to eat and drink without any issues here in the ED.  Discharged in good condition.  This chart was dictated using voice recognition software.  Despite best efforts to proofread,  errors can occur which can change the documentation meaning.    Lennice Sites, DO 10/11/22 1135

## 2022-10-11 NOTE — ED Provider Notes (Signed)
[facility] Emergency Department Provider Note MRN:  [id number]  Arrival date & time: 10/11/22     Chief Complaint   Nausea   History of Present Illness   Sheila Jacobs is a 72 y.o. year-old female with a history of CAD presenting to the ED with chief complaint of emesis.  Nausea vomiting for the past 3 weeks, getting worse, seems to be vomiting up feculent material recently.  History obtained from daughter.  Normal bowel movements, not complaining of pain.  Review of Systems  A thorough review of systems was obtained and all systems are negative except as noted in the HPI and PMH.   Patient's Health History    Past Medical History:  Diagnosis Date   Acute saddle pulmonary embolism without acute cor pulmonale (HCC) 12/29/2017   Anginal pain (HCC)    No angiographic evidence of CAD by cath 10/21/14   Arthritis    Breast cancer of upper-outer quadrant of left female breast ([place]) 01/31/2016   Colon polyps    GERD (gastroesophageal reflux disease)    Hearing deficit    Pt reports wearing hearing aides   Hearing loss    bilateral   History of radiation therapy 10/9-11/20/17   Left Breast 45 Gy in 25 fractions, Left Breast Boost 6 Gy in 3 fractions.    Hyperlipidemia    Hypertension    Morbid obesity (HCC)    OSA (obstructive sleep apnea) 01/23/2018   uses cpap   Palpitations 10/16/2016   Personal history of noncompliance with medical treatment, presenting hazards to health 08/27/2016   Personal history of radiation therapy    Thyroid goiter     Past Surgical History:  Procedure Laterality Date   BREAST BIOPSY Left 01/2016   BREAST LUMPECTOMY Left 04/2016   BREAST LUMPECTOMY WITH RADIOACTIVE SEED AND SENTINEL LYMPH NODE BIOPSY Left 04/11/2016   Procedure: BREAST LUMPECTOMY WITH RADIOACTIVE SEED AND SENTINEL LYMPH NODE BIOPSY;  Surgeon: Excell Seltzer, MD;  Location: [place];  Service: General;  Laterality: Left;   BRONCHIAL BIOPSY  11/14/2021    Procedure: BRONCHIAL BIOPSIES;  Surgeon: Garner Nash, DO;  Location: [facility];  Service: Pulmonary;;   BRONCHIAL BRUSHINGS  11/14/2021   Procedure: BRONCHIAL BRUSHINGS;  Surgeon: Garner Nash, DO;  Location: [place];  Service: Pulmonary;;   BRONCHIAL NEEDLE ASPIRATION BIOPSY  11/14/2021   Procedure: BRONCHIAL NEEDLE ASPIRATION BIOPSIES;  Surgeon: Garner Nash, DO;  Location: [place];  Service: Pulmonary;;   COLONOSCOPY WITH PROPOFOL N/A 10/20/2015   Procedure: COLONOSCOPY WITH PROPOFOL;  Surgeon: Mauri Pole, MD;  Location: [facility];  Service: Endoscopy;  Laterality: N/A;   ESOPHAGOGASTRODUODENOSCOPY (EGD) WITH PROPOFOL N/A 10/20/2015   Procedure: ESOPHAGOGASTRODUODENOSCOPY (EGD) WITH PROPOFOL;  Surgeon: Mauri Pole, MD;  Location: [facility];  Service: Endoscopy;  Laterality: N/A;   IR ANGIOGRAM PULMONARY BILATERAL SELECTIVE  12/29/2017   IR ANGIOGRAM SELECTIVE EACH ADDITIONAL VESSEL  12/29/2017   IR ANGIOGRAM SELECTIVE EACH ADDITIONAL VESSEL  12/29/2017   IR INFUSION THROMBOL ARTERIAL INITIAL (MS)  12/29/2017   IR INFUSION THROMBOL ARTERIAL INITIAL (MS)  12/29/2017   IR THROMB F/U EVAL ART/VEN FINAL DAY (MS)  12/30/2017   IR US GUIDE VASC ACCESS RIGHT  12/29/2017   LEFT HEART CATHETERIZATION WITH CORONARY ANGIOGRAM N/A 10/21/2014   Procedure: LEFT HEART CATHETERIZATION WITH CORONARY ANGIOGRAM;  Surgeon: Burnell Blanks, MD;  Location: [facility];  Service: Cardiovascular;  Laterality: N/A;   THYROIDECTOMY N/A 02/26/2017  Procedure: TOTAL THYROIDECTOMY;  Surgeon: Excell Seltzer, MD;  Location: [facility];  Service: General;  Laterality: N/A;   TUBAL LIGATION     VIDEO BRONCHOSCOPY WITH RADIAL ENDOBRONCHIAL ULTRASOUND  11/14/2021   Procedure: RADIAL ENDOBRONCHIAL ULTRASOUND;  Surgeon: Garner Nash, DO;  Location: [facility];  Service: Pulmonary;;    Family History  Problem Relation Age of Onset   Hypertension Mother    Heart disease Mother         Pacemaker   Kidney disease Mother    Cataracts Mother    Hearing loss Mother    Diabetes Mother    Liver disease Mother    Diabetes Sister    Cataracts Brother    Hearing loss Brother    Colon cancer Neg Hx    Thyroid disease Neg Hx     Social History   Socioeconomic History   Marital status: Single    Spouse name: Not on file   Number of children: 1   Years of education: 12   Highest education level: Not on file  Occupational History   Occupation: Retired  Tobacco Use   Smoking status: Former    Packs/day: 1.00    Years: 45.00    Total pack years: 45.00    Types: Cigarettes    Quit date: 07/11/2014    Years since quitting: 8.2   Smokeless tobacco: Never  Vaping Use   Vaping Use: Never used  Substance and Sexual Activity   Alcohol use: No    Alcohol/week: 0.0 standard drinks of alcohol   Drug use: No   Sexual activity: Not on file    Comment: Tubal Ligation  Other Topics Concern   Not on file  Social History Narrative   Lives with daughter.    Right-hand.   Occasional use of caffeine.    Social Determinants of Health   Financial Resource Strain: Not on file  Food Insecurity: Not on file  Transportation Needs: Not on file  Physical Activity: Not on file  Stress: Not on file  Social Connections: Not on file  Intimate Partner Violence: Not on file     Physical Exam   Vitals:   10/11/22 0625 10/11/22 0630  BP:  119/72  Pulse: 70 80  Resp: 19 20  Temp:    SpO2: 99% 97%    CONSTITUTIONAL: Chronically ill-appearing, NAD NEURO/PSYCH:  Alert and oriented x 3, no focal deficits EYES:  eyes equal and reactive ENT/NECK:  no LAD, no JVD CARDIO: Regular rate, well-perfused, normal S1 and S2 PULM:  CTAB no wheezing or rhonchi GI/GU:  non-distended, non-tender MSK/SPINE:  No gross deformities, no edema SKIN:  no rash, atraumatic   *Additional and/or pertinent findings included in MDM below  Diagnostic and Interventional Summary    EKG  Interpretation  Date/Time:  Thursday October 11 2022 03:35:05 EST Ventricular Rate:  87 PR Interval:  182 QRS Duration: 95 QT Interval:  393 QTC Calculation: 473 R Axis:   -79 Text Interpretation: Sinus rhythm Left anterior fascicular block Consider right ventricular hypertrophy Consider anterior infarct ST elevation, consider inferior injury Confirmed by Gerlene Fee [phone]) on 10/11/2022 6:10:48 AM       Labs Reviewed  CBC WITH DIFFERENTIAL/PLATELET - Abnormal; Notable for the following components:      Result Value   Hemoglobin 15.2 (*)    nRBC 0.7 (*)    All other components within normal limits  COMPREHENSIVE METABOLIC PANEL - Abnormal; Notable for the following components:   CO2  21 (*)    Glucose, Bld 128 (*)    Creatinine, Ser 1.16 (*)    Calcium 11.4 (*)    Total Protein 8.4 (*)    AST 74 (*)    Alkaline Phosphatase 255 (*)    Total Bilirubin 1.4 (*)    GFR, Estimated 50 (*)    Anion gap 24 (*)    All other components within normal limits  LIPASE, BLOOD - Abnormal; Notable for the following components:   Lipase 54 (*)    All other components within normal limits  URINALYSIS, ROUTINE W REFLEX MICROSCOPIC  TROPONIN I (HIGH SENSITIVITY)    CT HEAD WO CONTRAST (5MM)  Final Result    CT ABDOMEN PELVIS W CONTRAST  Final Result      Medications  ondansetron (ZOFRAN-ODT) disintegrating tablet 4 mg (4 mg Oral Given 10/11/22 0037)  sodium chloride 0.9 % bolus 500 mL (0 mLs Intravenous Stopped 10/11/22 0550)  ondansetron (ZOFRAN) injection 4 mg (4 mg Intravenous Given 10/11/22 0414)  iohexol (OMNIPAQUE) 350 MG/ML injection 75 mL (75 mLs Intravenous Contrast Given 10/11/22 0507)     Procedures  /  Critical Care Procedures  ED Course and Medical Decision Making  Initial Impression and Ddx Differential diagnosis includes intracranial mass, bowel obstruction, gastroenteritis, electrolyte disturbance, UTI, awaiting labs, CT.  Past medical/surgical history that increases  complexity of ED encounter: Metastatic cancer  Interpretation of Diagnostics I personally reviewed the EKG and my interpretation is as follows: Sinus rhythm, nonspecific changes from prior  Labs overall reassuring with mild gap acidosis but otherwise no significant blood count or electrolyte disturbance.  Patient Reassessment and Ultimate Disposition/Management     Discussed with patient's granddaughter, family and patient are aware of the cancer, sounds like patient is in denial and refusing treatment for it.  Difficult patient encounter because patient does not have her hearing aids with her and cannot hear anything.  She has been sleeping comfortably with no further vomiting for the past few hours.  Given the EKG will obtain troponin to exclude signs of ischemia.  Anticipating discharge, advised close PCP follow-up.  Patient management required discussion with the following services or consulting groups:  None  Complexity of Problems Addressed Acute illness or injury that poses threat of life of bodily function  Additional Data Reviewed and Analyzed Further history obtained from: Further history from spouse/family member  Additional Factors Impacting ED Encounter Risk Prescriptions  Barth Kirks. Sedonia Small, [place] [email or web address]  Final Clinical Impressions(s) / ED Diagnoses     ICD-10-CM   1. Nausea and vomiting, unspecified vomiting type  R11.2       ED Discharge Orders          Ordered    ondansetron (ZOFRAN-ODT) 4 MG disintegrating tablet  Every 8 hours PRN        10/11/22 0711             Discharge Instructions Discussed with and Provided to Patient:     Discharge Instructions      You were evaluated in the Emergency Department and after careful evaluation, we did not find any emergent condition requiring admission or further testing in the hospital.  Your exam/testing today is overall reassuring.  Very  important that you follow-up with your primary care doctor to discuss your latest CT scan results.  Please return to the Emergency Department if you experience any worsening of your condition.   Thank you for allowing [place]  to be a part of your care.       Maudie Flakes, MD 10/11/22 [phone]

## 2022-10-11 NOTE — ED Notes (Signed)
Pt vomited a large amount of brown emesis. Provider made aware.

## 2022-10-11 NOTE — ED Notes (Signed)
Called lab and verified they received req for urine culture.

## 2022-10-11 NOTE — Discharge Instructions (Signed)
You were evaluated in the Emergency Department and after careful evaluation, we did not find any emergent condition requiring admission or further testing in the hospital.  Your exam/testing today is overall reassuring.  Very important that you follow-up with your primary care doctor to discuss your latest CT scan results.  Please return to the Emergency Department if you experience any worsening of your condition.   Thank you for allowing [place] to be a part of your care.

## 2022-10-11 NOTE — ED Notes (Signed)
Attempted IV start x2, unsuccessful. IV team consult placed.

## 2022-10-12 ENCOUNTER — Other Ambulatory Visit: Payer: Self-pay | Admitting: *Deleted

## 2022-10-12 ENCOUNTER — Telehealth: Payer: Self-pay | Admitting: *Deleted

## 2022-10-12 DIAGNOSIS — Z170 Estrogen receptor positive status [ER+]: Secondary | ICD-10-CM

## 2022-10-12 LAB — URINE CULTURE: Culture: NO GROWTH

## 2022-10-12 NOTE — Telephone Encounter (Signed)
Pt daughter [name] presented in the lobby requesting to speak with RN.  [name] sates pt was discharged from the hospital yesterday with UTI.  [name] also states pt had never started [employer] and requesting advice from staff as to when pt should resume.  RN informed [name] that in June 2023 after sever attempts to contact pt, pt returned call and stated she no longer wanted to f/u with our office and will stop all her medications.  RN scheduled appt for pt next week to discuss goals of care with MD.  RN encouraged [name] to bring pt to appt and be present to discuss with MD.  [name] verbalized understanding.

## 2022-10-16 ENCOUNTER — Inpatient Hospital Stay: Payer: 59

## 2022-10-16 ENCOUNTER — Inpatient Hospital Stay: Payer: 59 | Attending: Hematology and Oncology | Admitting: Hematology and Oncology

## 2022-10-16 VITALS — BP 115/74 | HR 99 | Temp 97.5°F | Resp 18

## 2022-10-16 DIAGNOSIS — C7951 Secondary malignant neoplasm of bone: Secondary | ICD-10-CM | POA: Insufficient documentation

## 2022-10-16 DIAGNOSIS — Z79899 Other long term (current) drug therapy: Secondary | ICD-10-CM | POA: Insufficient documentation

## 2022-10-16 DIAGNOSIS — Z79811 Long term (current) use of aromatase inhibitors: Secondary | ICD-10-CM | POA: Insufficient documentation

## 2022-10-16 DIAGNOSIS — C7800 Secondary malignant neoplasm of unspecified lung: Secondary | ICD-10-CM | POA: Insufficient documentation

## 2022-10-16 DIAGNOSIS — C50412 Malignant neoplasm of upper-outer quadrant of left female breast: Secondary | ICD-10-CM | POA: Insufficient documentation

## 2022-10-16 DIAGNOSIS — R110 Nausea: Secondary | ICD-10-CM | POA: Insufficient documentation

## 2022-10-16 DIAGNOSIS — Z7901 Long term (current) use of anticoagulants: Secondary | ICD-10-CM | POA: Insufficient documentation

## 2022-10-16 DIAGNOSIS — C787 Secondary malignant neoplasm of liver and intrahepatic bile duct: Secondary | ICD-10-CM | POA: Diagnosis not present

## 2022-10-16 DIAGNOSIS — Z170 Estrogen receptor positive status [ER+]: Secondary | ICD-10-CM | POA: Diagnosis not present

## 2022-10-16 DIAGNOSIS — Z888 Allergy status to other drugs, medicaments and biological substances status: Secondary | ICD-10-CM | POA: Diagnosis not present

## 2022-10-16 LAB — CBC WITH DIFFERENTIAL (CANCER CENTER ONLY)
Abs Immature Granulocytes: 0.12 10*3/uL — ABNORMAL HIGH (ref 0.00–0.07)
Basophils Absolute: 0 10*3/uL (ref 0.0–0.1)
Basophils Relative: 1 %
Eosinophils Absolute: 0 10*3/uL (ref 0.0–0.5)
Eosinophils Relative: 1 %
HCT: 38.6 % (ref 36.0–46.0)
Hemoglobin: 12.9 g/dL (ref 12.0–15.0)
Immature Granulocytes: 2 %
Lymphocytes Relative: 25 %
Lymphs Abs: 1.5 10*3/uL (ref 0.7–4.0)
MCH: 32.7 pg (ref 26.0–34.0)
MCHC: 33.4 g/dL (ref 30.0–36.0)
MCV: 97.7 fL (ref 80.0–100.0)
Monocytes Absolute: 0.7 10*3/uL (ref 0.1–1.0)
Monocytes Relative: 11 %
Neutro Abs: 3.7 10*3/uL (ref 1.7–7.7)
Neutrophils Relative %: 60 %
Platelet Count: 137 10*3/uL — ABNORMAL LOW (ref 150–400)
RBC: 3.95 MIL/uL (ref 3.87–5.11)
RDW: 14.6 % (ref 11.5–15.5)
WBC Count: 6.1 10*3/uL (ref 4.0–10.5)
nRBC: 2.1 % — ABNORMAL HIGH (ref 0.0–0.2)

## 2022-10-16 LAB — CMP (CANCER CENTER ONLY)
ALT: 38 U/L (ref 0–44)
AST: 63 U/L — ABNORMAL HIGH (ref 15–41)
Albumin: 3.5 g/dL (ref 3.5–5.0)
Alkaline Phosphatase: 333 U/L — ABNORMAL HIGH (ref 38–126)
Anion gap: 12 (ref 5–15)
BUN: 18 mg/dL (ref 8–23)
CO2: 27 mmol/L (ref 22–32)
Calcium: 11.2 mg/dL — ABNORMAL HIGH (ref 8.9–10.3)
Chloride: 109 mmol/L (ref 98–111)
Creatinine: 0.81 mg/dL (ref 0.44–1.00)
GFR, Estimated: >60 mL/min (ref >60)
Glucose, Bld: 100 mg/dL — ABNORMAL HIGH (ref 70–99)
Potassium: 3.7 mmol/L (ref 3.5–5.1)
Sodium: 148 mmol/L — ABNORMAL HIGH (ref 135–145)
Total Bilirubin: 1.1 mg/dL (ref 0.3–1.2)
Total Protein: 7 g/dL (ref 6.5–8.1)

## 2022-10-16 MED ORDER — LETROZOLE 2.5 MG PO TABS: 2.5000 mg | ORAL_TABLET | Freq: Every day | ORAL | 3 refills | Status: DC

## 2022-10-16 NOTE — Assessment & Plan Note (Signed)
Left lumpectomy 04/11/2016: IDC grade 3, 1.2 cm, IG DCIS, LVI present, 0/8 lymph nodes negative, ER 100%, PR 90%, HER-2 negative, Ki-67 10%, T1 cN0 stage I a pathologic stage   Adj XRT 06/18/16 to 07/30/16   Treatment Plan: Adj Anti-estrogen therapy with Letrozole 2.5 mg daily started 08/29/2016   Letrozole toxicities: Denies any hot flashes. Occasional lightheadedness    Breast cancer surveillance: 1.  CT chest done 08/12/2021: Multiple bilateral lung nodules new since 2019  2. mammogram  scheduled for 10/18/21   Bronchoscopy 11/14/2021 by Dr. Valeta Harms: Findings consistent with metastatic breast cancer, ER greater than 95%, GA TA 3 and GCDFP are negative  Current treatment: Verzinio with letrozole started 12/05/2021 Abemaciclib toxicities:  10/11/2022: CT head: Negative 10/11/2022: CT CAP: Widespread pulmonary and osseous metastatic disease.  Mild hepatic metastatic disease.  Left posterior pararenal nodule presumed soft tissue metastasis  Return to clinic in 3 months with labs and follow-up.  Scans will be every 6 months

## 2022-10-16 NOTE — Progress Notes (Signed)
Patient Care Team: Arthur Holms, NP as PCP - General (Nurse Practitioner) Minus Breeding, MD as PCP - Cardiology (Cardiology) Sueanne Margarita, MD as PCP - Sleep Medicine (Cardiology) Nicholas Lose, MD as Medical Oncologist (Hematology and Oncology) Excell Seltzer, MD (Inactive) as Consulting Physician (General Surgery) Gardenia Phlegm, NP as Nurse Practitioner (Hematology and Oncology) Eppie Gibson, MD as Attending Physician (Radiation Oncology) Raina Mina, RPH-CPP as Pharmacist (Hematology and Oncology)  DIAGNOSIS:  Encounter Diagnosis  Name Primary?   Malignant neoplasm of upper-outer quadrant of left breast in female, estrogen receptor positive ([place]) Yes    SUMMARY OF ONCOLOGIC HISTORY: Oncology History  Breast cancer of upper-outer quadrant of left female breast ([place])  01/27/2016 Initial Diagnosis   Screening mammogram: Left breast mass 9 mm, grade 1-2 IDC with DCIS, ER 100%, PR 90%, Ki-67 10%, HER-2 negative ratio 1.5, T1 BN 0 stage IA clinical stage   04/11/2016 Surgery   Left lumpectomy ([place]): IDC grade 3, 1.2 cm, IG DCIS, LVI present, 0/8 lymph nodes negative, ER 100%, PR 90%, HER-2 negative, Ki-67 10%, T1 cN0 stage I a pathologic stage   06/18/2016 - 07/30/2016 Radiation Therapy   Adj XRT Isidore Moos): 1) Left Breast / 45 Gy in 25 fractions.  2) Left Breast Boost /  6 Gy in 3 fractions   08/29/2016 -  Anti-estrogen oral therapy   Anastrozole daily     CHIEF COMPLIANT: Follow-up of left breast cancer/ review scans  INTERVAL HISTORY: Sheila Jacobs is a 72 y.o. with above-mentioned history of  left breast cancer treated with lumpectomy, radiation, and is currently on letrozole therapy. She presents to the clinic today for follow-up. She says when she walks she gets out of breath really fast. She says she is not eating.  She is not talking at all.  She appears to be closing her eyes and not staying awake throughout the whole clinic appointment.  Her  daughter is with her.  She reports that she has been doing the same at home as well.  She was recently admitted to the hospital with nausea and vomiting and had an abdominal CT scan that showed evidence of liver metastases as well as extensive bone metastases in addition to the lung metastases.  Almost a year ago she was diagnosed with lung nodules and we recommended starting her on Verzinio with letrozole but apparently she has not taken any of the medication all along.  It is unclear as to why she did not take the medication and she had not followed up with those either.  Over the past year she has continuously declined gradually.   ALLERGIES:  is allergic to cortisone, effexor [venlafaxine], lexapro [escitalopram], and xarelto [rivaroxaban].  MEDICATIONS:  Current Outpatient Medications  Medication Sig Dispense Refill   abemaciclib (VERZENIO) 100 MG tablet Take 1 tablet (100 mg total) by mouth 2 (two) times daily. Swallow tablets whole. Do not chew, crush, or split tablets before swallowing. 56 tablet 0   acetaminophen (TYLENOL) 500 MG tablet Take 500 mg by mouth every 6 (six) hours as needed for moderate pain or headache.     amLODipine (NORVASC) 5 MG tablet Take 1 tablet (5 mg total) by mouth daily. 30 tablet 0   Cholecalciferol (VITAMIN D) 125 MCG (5000 UT) CAPS Take 5,000 Units by mouth daily.     ELIQUIS 5 MG TABS tablet TAKE 1 TABLET BY MOUTH TWICE A DAY 180 tablet 1   Ferrous Gluconate (IRON 27 PO) Take 1 tablet  by mouth 2 (two) times daily.     gabapentin (NEURONTIN) 100 MG capsule Take 100 mg by mouth at bedtime.     letrozole (FEMARA) 2.5 MG tablet Take 1 tablet (2.5 mg total) by mouth daily. 90 tablet 3   levothyroxine (SYNTHROID) 100 MCG tablet Take 1 tablet (100 mcg total) by mouth daily before breakfast. 90 tablet 0   metoprolol succinate (TOPROL-XL) 50 MG 24 hr tablet Take 50 mg by mouth daily.     nitroGLYCERIN (NITROSTAT) 0.4 MG SL tablet 0.4 mg every 5 (five) minutes as  needed for chest pain.     omeprazole (PRILOSEC) 20 MG capsule Take 20 mg by mouth daily.     ondansetron (ZOFRAN-ODT) 4 MG disintegrating tablet Take 1 tablet (4 mg total) by mouth every 8 (eight) hours as needed for nausea or vomiting. 20 tablet 0   potassium chloride SA (KLOR-CON M) 20 MEQ tablet Take 1 tablet (20 mEq total) by mouth 2 (two) times daily for 4 days. 8 tablet 0   triamcinolone cream (KENALOG) 0.1 % 1 application daily as needed (Rash).     vitamin B-12 (CYANOCOBALAMIN) 1000 MCG tablet Take 1,000 mcg by mouth daily.     No current facility-administered medications for this visit.    PHYSICAL EXAMINATION: ECOG PERFORMANCE STATUS: 4 - Bedbound  Vitals:   10/16/22 1136  BP: 115/74  Pulse: 99  Resp: 18  Temp: (!) 97.5 F (36.4 C)  SpO2: 100%   Filed Weights      LABORATORY DATA:  I have reviewed the data as listed    Latest Ref Rng & Units 10/16/2022   11:05 AM 10/11/2022    7:52 AM 10/10/2022    8:47 PM  CMP  Glucose 70 - 99 mg/dL 100  109  128   BUN 8 - 23 mg/dL '18  16  16   '$ Creatinine 0.44 - 1.00 mg/dL 0.81  0.94  1.16   Sodium 135 - 145 mmol/L 148  145  145   Potassium 3.5 - 5.1 mmol/L 3.7  4.9  3.5   Chloride 98 - 111 mmol/L 109  106  100   CO2 22 - 32 mmol/L '27  23  21   '$ Calcium 8.9 - 10.3 mg/dL 11.2  10.1  11.4   Total Protein 6.5 - 8.1 g/dL 7.0   8.4   Total Bilirubin 0.3 - 1.2 mg/dL 1.1   1.4   Alkaline Phos 38 - 126 U/L 333   255   AST 15 - 41 U/L 63   74   ALT 0 - 44 U/L 38   38     Lab Results  Component Value Date   WBC 6.1 10/16/2022   HGB 12.9 10/16/2022   HCT 38.6 10/16/2022   MCV 97.7 10/16/2022   PLT 137 (L) 10/16/2022   NEUTROABS 3.7 10/16/2022    ASSESSMENT & PLAN:  Breast cancer of upper-outer quadrant of left female breast ([place]) Left lumpectomy 04/11/2016: IDC grade 3, 1.2 cm, IG DCIS, LVI present, 0/8 lymph nodes negative, ER 100%, PR 90%, HER-2 negative, Ki-67 10%, T1 cN0 stage I a pathologic stage   Adj XRT 06/18/16 to  07/30/16   Treatment Plan: Adj Anti-estrogen therapy with Letrozole 2.5 mg daily started 08/29/2016   Letrozole toxicities: Denies any hot flashes. Occasional lightheadedness    Breast cancer surveillance: 1.  CT chest done 08/12/2021: Multiple bilateral lung nodules new since 2019  2. mammogram  scheduled for 10/18/21  Bronchoscopy 11/14/2021 by Dr. Valeta Harms: Findings consistent with metastatic breast cancer, ER greater than 95%, GA TA 3 and GCDFP are negative  Current treatment: Verzinio with letrozole was prescribed 12/05/2021 but she has not taken it.    10/11/2022: CT head: Negative 10/11/2022: CT CAP: Widespread pulmonary and osseous metastatic disease. hepatic metastatic disease.  Left posterior pararenal nodule presumed soft tissue metastasis  Since she had not taken her medication over the past year it is evident that her cancer has progressed significantly.  They are much more larger and extensive lung metastases and now has a liver met along with extensive bone mets.  I discussed with the patient that if she does not take any medications and she is likely to die in the next 6 months. Her daughter is not motivated to convince her to take the medication. We will see her back in 2 months to reassess how she is tolerating it and to determine the future course of action. Will consult home palliative care.  Bone metastases: We will prescribe Xgeva in 2 weeks when she comes back.  No orders of the defined types were placed in this encounter.  The patient has a good understanding of the overall plan. she agrees with it. she will call with any problems that may develop before the next visit here. Total time spent: 30 mins including face to face time and time spent for planning, charting and co-ordination of care   Harriette Ohara, MD 10/16/22    I Gardiner Coins am acting as a Education administrator for [employer]  I have reviewed the above documentation for accuracy and completeness, and I agree  with the above.

## 2022-10-24 ENCOUNTER — Other Ambulatory Visit: Payer: Self-pay | Admitting: Hematology and Oncology

## 2022-10-25 ENCOUNTER — Other Ambulatory Visit: Payer: Self-pay | Admitting: *Deleted

## 2022-10-25 DIAGNOSIS — Z170 Estrogen receptor positive status [ER+]: Secondary | ICD-10-CM

## 2022-10-26 ENCOUNTER — Encounter: Payer: Self-pay | Admitting: *Deleted

## 2022-10-26 ENCOUNTER — Other Ambulatory Visit: Payer: Self-pay | Admitting: Hematology and Oncology

## 2022-10-26 NOTE — Progress Notes (Signed)
Received message from [facility] team stating [name] is not preferred and will need to be changed to Sheila Jacobs.  MD states he will change tx plan.  Message sent to scheduling to change upcoming injection appt to be infusion. MD will discuss Sheila Jacobs with pt prior to infusion.

## 2022-10-29 NOTE — Progress Notes (Incomplete)
Patient Care Team: Arthur Holms, NP as PCP - General (Nurse Practitioner) Minus Breeding, MD as PCP - Cardiology (Cardiology) Sueanne Margarita, MD as PCP - Sleep Medicine (Cardiology) Nicholas Lose, MD as Medical Oncologist (Hematology and Oncology) Excell Seltzer, MD (Inactive) as Consulting Physician (General Surgery) Gardenia Phlegm, NP as Nurse Practitioner (Hematology and Oncology) Eppie Gibson, MD as Attending Physician (Radiation Oncology) Raina Mina, RPH-CPP as Pharmacist (Hematology and Oncology)  DIAGNOSIS: No diagnosis found.  SUMMARY OF ONCOLOGIC HISTORY: Oncology History  Breast cancer of upper-outer quadrant of left female breast ([place])  01/27/2016 Initial Diagnosis   Screening mammogram: Left breast mass 9 mm, grade 1-2 IDC with DCIS, ER 100%, PR 90%, Ki-67 10%, HER-2 negative ratio 1.5, T1 BN 0 stage IA clinical stage   04/11/2016 Surgery   Left lumpectomy ([facility]): IDC grade 3, 1.2 cm, IG DCIS, LVI present, 0/8 lymph nodes negative, ER 100%, PR 90%, HER-2 negative, Ki-67 10%, T1 cN0 stage I a pathologic stage   06/18/2016 - 07/30/2016 Radiation Therapy   Adj XRT Isidore Moos): 1) Left Breast / 45 Gy in 25 fractions.  2) Left Breast Boost /  6 Gy in 3 fractions   08/29/2016 -  Anti-estrogen oral therapy   Anastrozole daily     CHIEF COMPLIANT: Follow-up of left breast cancer/ on anastrozole Discuss zometa since insurance will not cover xgeva  INTERVAL HISTORY: Sheila Jacobs is a  72 y.o. with above-mentioned history of  left breast cancer treated with lumpectomy, radiation, and is currently on letrozole therapy. She presents to the clinic today for follow-up.     ALLERGIES:  is allergic to cortisone, effexor [venlafaxine], lexapro [escitalopram], and xarelto [rivaroxaban].  MEDICATIONS:  Current Outpatient Medications  Medication Sig Dispense Refill   abemaciclib (VERZENIO) 100 MG tablet Take 1 tablet (100 mg total) by mouth 2 (two) times  daily. Swallow tablets whole. Do not chew, crush, or split tablets before swallowing. 56 tablet 0   acetaminophen (TYLENOL) 500 MG tablet Take 500 mg by mouth every 6 (six) hours as needed for moderate pain or headache.     amLODipine (NORVASC) 5 MG tablet Take 1 tablet (5 mg total) by mouth daily. 30 tablet 0   Cholecalciferol (VITAMIN D) 125 MCG (5000 UT) CAPS Take 5,000 Units by mouth daily.     ELIQUIS 5 MG TABS tablet TAKE 1 TABLET BY MOUTH TWICE A DAY 180 tablet 1   Ferrous Gluconate (IRON 27 PO) Take 1 tablet by mouth 2 (two) times daily.     gabapentin (NEURONTIN) 100 MG capsule Take 100 mg by mouth at bedtime.     letrozole (FEMARA) 2.5 MG tablet Take 1 tablet (2.5 mg total) by mouth daily. 90 tablet 3   levothyroxine (SYNTHROID) 100 MCG tablet Take 1 tablet (100 mcg total) by mouth daily before breakfast. 90 tablet 0   metoprolol succinate (TOPROL-XL) 50 MG 24 hr tablet Take 50 mg by mouth daily.     nitroGLYCERIN (NITROSTAT) 0.4 MG SL tablet 0.4 mg every 5 (five) minutes as needed for chest pain.     omeprazole (PRILOSEC) 20 MG capsule Take 20 mg by mouth daily.     ondansetron (ZOFRAN-ODT) 4 MG disintegrating tablet Take 1 tablet (4 mg total) by mouth every 8 (eight) hours as needed for nausea or vomiting. 20 tablet 0   potassium chloride SA (KLOR-CON M) 20 MEQ tablet Take 1 tablet (20 mEq total) by mouth 2 (two) times daily for 4 days. 8  tablet 0   triamcinolone cream (KENALOG) 0.1 % 1 application daily as needed (Rash).     vitamin B-12 (CYANOCOBALAMIN) 1000 MCG tablet Take 1,000 mcg by mouth daily.     No current facility-administered medications for this visit.    PHYSICAL EXAMINATION: ECOG PERFORMANCE STATUS: {CHL ONC ECOG PS:[id number]}  There were no vitals filed for this visit. There were no vitals filed for this visit.  BREAST:*** No palpable masses or nodules in either right or left breasts. No palpable axillary supraclavicular or infraclavicular adenopathy no breast  tenderness or nipple discharge. (exam performed in the presence of a chaperone)  LABORATORY DATA:  I have reviewed the data as listed    Latest Ref Rng & Units 10/16/2022   11:05 AM 10/11/2022    7:52 AM 10/10/2022    8:47 PM  CMP  Glucose 70 - 99 mg/dL 100  109  128   BUN 8 - 23 mg/dL 18  16  16   $ Creatinine 0.44 - 1.00 mg/dL 0.81  0.94  1.16   Sodium 135 - 145 mmol/L 148  145  145   Potassium 3.5 - 5.1 mmol/L 3.7  4.9  3.5   Chloride 98 - 111 mmol/L 109  106  100   CO2 22 - 32 mmol/L 27  23  21   $ Calcium 8.9 - 10.3 mg/dL 11.2  10.1  11.4   Total Protein 6.5 - 8.1 g/dL 7.0   8.4   Total Bilirubin 0.3 - 1.2 mg/dL 1.1   1.4   Alkaline Phos 38 - 126 U/L 333   255   AST 15 - 41 U/L 63   74   ALT 0 - 44 U/L 38   38     Lab Results  Component Value Date   WBC 6.1 10/16/2022   HGB 12.9 10/16/2022   HCT 38.6 10/16/2022   MCV 97.7 10/16/2022   PLT 137 (L) 10/16/2022   NEUTROABS 3.7 10/16/2022    ASSESSMENT & PLAN:  No problem-specific Assessment & Plan notes found for this encounter.    No orders of the defined types were placed in this encounter.  The patient has a good understanding of the overall plan. she agrees with it. she will call with any problems that may develop before the next visit here. Total time spent: 30 mins including face to face time and time spent for planning, charting and co-ordination of care   Suzzette Righter, [place] 10/29/22    I Gardiner Coins am acting as a Education administrator for [employer]  ***

## 2022-10-30 ENCOUNTER — Telehealth: Payer: Self-pay | Admitting: Hematology and Oncology

## 2022-10-30 NOTE — Assessment & Plan Note (Signed)
Left lumpectomy 04/11/2016: IDC grade 3, 1.2 cm, IG DCIS, LVI present, 0/8 lymph nodes negative, ER 100%, PR 90%, HER-2 negative, Ki-67 10%, T1 cN0 stage I a pathologic stage   Adj XRT 06/18/16 to 07/30/16   Treatment Plan: Adj Anti-estrogen therapy with Letrozole 2.5 mg daily started 08/29/2016   Letrozole toxicities: Denies any hot flashes. Occasional lightheadedness    Breast cancer surveillance: 1.  CT chest done 08/12/2021: Multiple bilateral lung nodules new since 2019  2. mammogram  scheduled for 10/18/21   Bronchoscopy 11/14/2021 by Dr. Valeta Harms: Findings consistent with metastatic breast cancer, ER greater than 95%, GA TA 3 and GCDFP are negative   Current treatment: Verzinio with letrozole was prescribed 12/05/2021 but she has not taken it.     10/11/2022: CT head: Negative 10/11/2022: CT CAP: Widespread pulmonary and osseous metastatic disease. hepatic metastatic disease.  Left posterior pararenal nodule presumed soft tissue metastasis  We will see her back in 2 months to reassess how she is tolerating it and to determine the future course of action. Will consult home palliative care.   Bone metastases: We will prescribe Xgeva in 2 weeks when she comes back.

## 2022-10-30 NOTE — Telephone Encounter (Signed)
Scheduled appointment per staff message. Unable to leave a voicemail due to patients mailbox being full.

## 2022-10-31 ENCOUNTER — Inpatient Hospital Stay: Payer: 59

## 2022-10-31 ENCOUNTER — Telehealth: Payer: Self-pay | Admitting: Hematology and Oncology

## 2022-10-31 ENCOUNTER — Other Ambulatory Visit: Payer: Self-pay

## 2022-10-31 ENCOUNTER — Inpatient Hospital Stay (HOSPITAL_BASED_OUTPATIENT_CLINIC_OR_DEPARTMENT_OTHER): Payer: 59 | Admitting: Hematology and Oncology

## 2022-10-31 ENCOUNTER — Encounter: Payer: Self-pay | Admitting: *Deleted

## 2022-10-31 ENCOUNTER — Inpatient Hospital Stay: Payer: 59 | Admitting: Nurse Practitioner

## 2022-10-31 VITALS — BP 110/76 | HR 107 | Temp 97.7°F | Resp 18 | Ht 63.0 in | Wt 222.0 lb

## 2022-10-31 DIAGNOSIS — Z170 Estrogen receptor positive status [ER+]: Secondary | ICD-10-CM

## 2022-10-31 DIAGNOSIS — C50412 Malignant neoplasm of upper-outer quadrant of left female breast: Secondary | ICD-10-CM | POA: Diagnosis not present

## 2022-10-31 DIAGNOSIS — R918 Other nonspecific abnormal finding of lung field: Secondary | ICD-10-CM | POA: Diagnosis not present

## 2022-10-31 LAB — CMP (CANCER CENTER ONLY)
ALT: 19 U/L (ref 0–44)
AST: 33 U/L (ref 15–41)
Albumin: 3.5 g/dL (ref 3.5–5.0)
Alkaline Phosphatase: 428 U/L — ABNORMAL HIGH (ref 38–126)
Anion gap: 13 (ref 5–15)
BUN: 13 mg/dL (ref 8–23)
CO2: 25 mmol/L (ref 22–32)
Calcium: 10.1 mg/dL (ref 8.9–10.3)
Chloride: 100 mmol/L (ref 98–111)
Creatinine: 1.09 mg/dL — ABNORMAL HIGH (ref 0.44–1.00)
GFR, Estimated: 54 mL/min — ABNORMAL LOW (ref >60)
Glucose, Bld: 124 mg/dL — ABNORMAL HIGH (ref 70–99)
Potassium: 3 mmol/L — ABNORMAL LOW (ref 3.5–5.1)
Sodium: 138 mmol/L (ref 135–145)
Total Bilirubin: 1 mg/dL (ref 0.3–1.2)
Total Protein: 7.2 g/dL (ref 6.5–8.1)

## 2022-10-31 LAB — CBC WITH DIFFERENTIAL (CANCER CENTER ONLY)
Abs Immature Granulocytes: 0.01 10*3/uL (ref 0.00–0.07)
Basophils Absolute: 0 10*3/uL (ref 0.0–0.1)
Basophils Relative: 1 %
Eosinophils Absolute: 0 10*3/uL (ref 0.0–0.5)
Eosinophils Relative: 1 %
HCT: 37.1 % (ref 36.0–46.0)
Hemoglobin: 12.6 g/dL (ref 12.0–15.0)
Immature Granulocytes: 0 %
Lymphocytes Relative: 45 %
Lymphs Abs: 1.9 10*3/uL (ref 0.7–4.0)
MCH: 32.2 pg (ref 26.0–34.0)
MCHC: 34 g/dL (ref 30.0–36.0)
MCV: 94.9 fL (ref 80.0–100.0)
Monocytes Absolute: 0.4 10*3/uL (ref 0.1–1.0)
Monocytes Relative: 8 %
Neutro Abs: 1.9 10*3/uL (ref 1.7–7.7)
Neutrophils Relative %: 45 %
Platelet Count: 168 10*3/uL (ref 150–400)
RBC: 3.91 MIL/uL (ref 3.87–5.11)
RDW: 15.1 % (ref 11.5–15.5)
WBC Count: 4.2 10*3/uL (ref 4.0–10.5)
nRBC: 0.7 % — ABNORMAL HIGH (ref 0.0–0.2)

## 2022-10-31 MED ORDER — SODIUM CHLORIDE 0.9 % IV SOLN: INTRAVENOUS | Status: DC

## 2022-10-31 MED ORDER — ZOLEDRONIC ACID 4 MG/100ML IV SOLN
4.0000 mg | Freq: Once | INTRAVENOUS | Status: AC
  Administered 2022-10-31: 4 mg via INTRAVENOUS
  Filled 2022-10-31: qty 100

## 2022-10-31 MED ORDER — SODIUM CHLORIDE 0.9 % IV SOLN: Freq: Once | INTRAVENOUS | Status: DC

## 2022-10-31 NOTE — Patient Instructions (Signed)

## 2022-10-31 NOTE — Progress Notes (Signed)
Pt has poor venous access in addition to restricted extremity. 3 Different RN's from the infusion room attempted IV access without success. IV team consult ordered. IV team was able to get an IV under US guidance.

## 2022-10-31 NOTE — Telephone Encounter (Signed)
Tried calling the patient to update her on the infusion appointment that was added to her appointments. Still unable to get in touch with the patient due to the mailbox being full.

## 2022-10-31 NOTE — Progress Notes (Deleted)
[facility]  [phone] [phone]   Name: Sheila Jacobs Date: 10/31/2022 MRN: [id number]  DOB: October 22, 1950  Patient Care Team: Arthur Holms, NP as PCP - General (Nurse Practitioner) Minus Breeding, MD as PCP - Cardiology (Cardiology) Sueanne Margarita, MD as PCP - Sleep Medicine (Cardiology) Nicholas Lose, MD as Medical Oncologist (Hematology and Oncology) Excell Seltzer, MD (Inactive) as Consulting Physician (General Surgery) Delice Bison, Charlestine Massed, NP as Nurse Practitioner (Hematology and Oncology) Eppie Gibson, MD as Attending Physician (Radiation Oncology) Raina Mina, RPH-CPP as Pharmacist (Hematology and Oncology)    REASON FOR CONSULTATION: Sheila Jacobs is a 72 y.o. female with oncologic medical history including left breast cancer treated with lumpectomy, radiation, and is currently on letrozole therapy (01/2016) with liver, lung, and bone metastasis. Palliative ask to see for symptom management and goals of care.    SOCIAL HISTORY:     reports that she quit smoking about 8 years ago. Her smoking use included cigarettes. She has a 45.00 pack-year smoking history. She has never used smokeless tobacco. She reports that she does not drink alcohol and does not use drugs.  ADVANCE DIRECTIVES:  None on file  CODE STATUS: Full code  PAST MEDICAL HISTORY: Past Medical History:  Diagnosis Date   Acute saddle pulmonary embolism without acute cor pulmonale (HCC) 12/29/2017   Anginal pain (HCC)    No angiographic evidence of CAD by cath 10/21/14   Arthritis    Breast cancer of upper-outer quadrant of left female breast ([place]) 01/31/2016   Colon polyps    GERD (gastroesophageal reflux disease)    Hearing deficit    Pt reports wearing hearing aides   Hearing loss    bilateral   History of radiation therapy 10/9-11/20/17   Left Breast 45 Gy in 25 fractions, Left Breast Boost 6 Gy in 3 fractions.     Hyperlipidemia    Hypertension    Morbid obesity ([place])    OSA (obstructive sleep apnea) 01/23/2018   uses cpap   Palpitations 10/16/2016   Personal history of noncompliance with medical treatment, presenting hazards to health 08/27/2016   Personal history of radiation therapy    Thyroid goiter     PAST SURGICAL HISTORY:  Past Surgical History:  Procedure Laterality Date   BREAST BIOPSY Left 01/2016   BREAST LUMPECTOMY Left 04/2016   BREAST LUMPECTOMY WITH RADIOACTIVE SEED AND SENTINEL LYMPH NODE BIOPSY Left 04/11/2016   Procedure: BREAST LUMPECTOMY WITH RADIOACTIVE SEED AND SENTINEL LYMPH NODE BIOPSY;  Surgeon: Excell Seltzer, MD;  Location: [facility];  Service: General;  Laterality: Left;   BRONCHIAL BIOPSY  11/14/2021   Procedure: BRONCHIAL BIOPSIES;  Surgeon: Garner Nash, DO;  Location: [facility];  Service: Pulmonary;;   BRONCHIAL BRUSHINGS  11/14/2021   Procedure: BRONCHIAL BRUSHINGS;  Surgeon: Garner Nash, DO;  Location: [facility];  Service: Pulmonary;;   BRONCHIAL NEEDLE ASPIRATION BIOPSY  11/14/2021   Procedure: BRONCHIAL NEEDLE ASPIRATION BIOPSIES;  Surgeon: Garner Nash, DO;  Location: [facility];  Service: Pulmonary;;   COLONOSCOPY WITH PROPOFOL N/A 10/20/2015   Procedure: COLONOSCOPY WITH PROPOFOL;  Surgeon: Mauri Pole, MD;  Location: [facility];  Service: Endoscopy;  Laterality: N/A;   ESOPHAGOGASTRODUODENOSCOPY (EGD) WITH PROPOFOL N/A 10/20/2015   Procedure: ESOPHAGOGASTRODUODENOSCOPY (EGD) WITH PROPOFOL;  Surgeon: Mauri Pole, MD;  Location: [facility];  Service: Endoscopy;  Laterality: N/A;   IR ANGIOGRAM PULMONARY BILATERAL SELECTIVE  12/29/2017   IR ANGIOGRAM  SELECTIVE EACH ADDITIONAL VESSEL  12/29/2017   IR ANGIOGRAM SELECTIVE EACH ADDITIONAL VESSEL  12/29/2017   IR INFUSION THROMBOL ARTERIAL INITIAL (MS)  12/29/2017   IR INFUSION THROMBOL ARTERIAL INITIAL (MS)  12/29/2017   IR THROMB F/U EVAL ART/VEN FINAL DAY (MS)  12/30/2017   IR US GUIDE  VASC ACCESS RIGHT  12/29/2017   LEFT HEART CATHETERIZATION WITH CORONARY ANGIOGRAM N/A 10/21/2014   Procedure: LEFT HEART CATHETERIZATION WITH CORONARY ANGIOGRAM;  Surgeon: Burnell Blanks, MD;  Location: [facility];  Service: Cardiovascular;  Laterality: N/A;   THYROIDECTOMY N/A 02/26/2017   Procedure: TOTAL THYROIDECTOMY;  Surgeon: Excell Seltzer, MD;  Location: [facility];  Service: General;  Laterality: N/A;   TUBAL LIGATION     VIDEO BRONCHOSCOPY WITH RADIAL ENDOBRONCHIAL ULTRASOUND  11/14/2021   Procedure: RADIAL ENDOBRONCHIAL ULTRASOUND;  Surgeon: Garner Nash, DO;  Location: [facility];  Service: Pulmonary;;    HEMATOLOGY/ONCOLOGY HISTORY:  Oncology History  Breast cancer of upper-outer quadrant of left female breast ([place])  01/27/2016 Initial Diagnosis   Screening mammogram: Left breast mass 9 mm, grade 1-2 IDC with DCIS, ER 100%, PR 90%, Ki-67 10%, HER-2 negative ratio 1.5, T1 BN 0 stage IA clinical stage   04/11/2016 Surgery   Left lumpectomy ([facility]): IDC grade 3, 1.2 cm, IG DCIS, LVI present, 0/8 lymph nodes negative, ER 100%, PR 90%, HER-2 negative, Ki-67 10%, T1 cN0 stage I a pathologic stage   06/18/2016 - 07/30/2016 Radiation Therapy   Adj XRT Isidore Moos): 1) Left Breast / 45 Gy in 25 fractions.  2) Left Breast Boost /  6 Gy in 3 fractions   08/29/2016 -  Anti-estrogen oral therapy   Anastrozole daily     ALLERGIES:  is allergic to cortisone, effexor [venlafaxine], lexapro [escitalopram], and xarelto [rivaroxaban].  MEDICATIONS:  Current Outpatient Medications  Medication Sig Dispense Refill   abemaciclib (VERZENIO) 100 MG tablet Take 1 tablet (100 mg total) by mouth 2 (two) times daily. Swallow tablets whole. Do not chew, crush, or split tablets before swallowing. 56 tablet 0   acetaminophen (TYLENOL) 500 MG tablet Take 500 mg by mouth every 6 (six) hours as needed for moderate pain or headache.     amLODipine (NORVASC) 5 MG tablet Take 1 tablet (5 mg total) by  mouth daily. 30 tablet 0   Cholecalciferol (VITAMIN D) 125 MCG (5000 UT) CAPS Take 5,000 Units by mouth daily.     ELIQUIS 5 MG TABS tablet TAKE 1 TABLET BY MOUTH TWICE A DAY 180 tablet 1   Ferrous Gluconate (IRON 27 PO) Take 1 tablet by mouth 2 (two) times daily.     gabapentin (NEURONTIN) 100 MG capsule Take 100 mg by mouth at bedtime.     letrozole (FEMARA) 2.5 MG tablet Take 1 tablet (2.5 mg total) by mouth daily. 90 tablet 3   levothyroxine (SYNTHROID) 100 MCG tablet Take 1 tablet (100 mcg total) by mouth daily before breakfast. 90 tablet 0   metoprolol succinate (TOPROL-XL) 50 MG 24 hr tablet Take 50 mg by mouth daily.     nitroGLYCERIN (NITROSTAT) 0.4 MG SL tablet 0.4 mg every 5 (five) minutes as needed for chest pain.     omeprazole (PRILOSEC) 20 MG capsule Take 20 mg by mouth daily.     ondansetron (ZOFRAN-ODT) 4 MG disintegrating tablet Take 1 tablet (4 mg total) by mouth every 8 (eight) hours as needed for nausea or vomiting. 20 tablet 0   potassium chloride SA (KLOR-CON M) 20 MEQ  tablet Take 1 tablet (20 mEq total) by mouth 2 (two) times daily for 4 days. 8 tablet 0   triamcinolone cream (KENALOG) 0.1 % 1 application daily as needed (Rash).     vitamin B-12 (CYANOCOBALAMIN) 1000 MCG tablet Take 1,000 mcg by mouth daily.     No current facility-administered medications for this visit.    VITAL SIGNS: There were no vitals taken for this visit. There were no vitals filed for this visit.  Estimated body mass index is 43.58 kg/m as calculated from the following:   Height as of 10/11/22: 5' 3"$  (1.6 m).   Weight as of 10/11/22: 246 lb (111.6 kg).  LABS: CBC:    Component Value Date/Time   WBC 6.1 10/16/2022 1105   WBC 5.5 10/10/2022 2047   HGB 12.9 10/16/2022 1105   HGB 12.4 10/22/2018 1030   HGB 11.2 (L) 08/29/2016 0856   HCT 38.6 10/16/2022 1105   HCT 35.2 10/22/2018 1030   HCT 33.9 (L) 08/29/2016 0856   PLT 137 (L) 10/16/2022 1105   PLT 227 10/22/2018 1030   MCV 97.7  10/16/2022 1105   MCV 93 10/22/2018 1030   MCV 95.0 08/29/2016 0856   NEUTROABS 3.7 10/16/2022 1105   NEUTROABS 1.2 (L) 08/29/2016 0856   LYMPHSABS 1.5 10/16/2022 1105   LYMPHSABS 1.3 08/29/2016 0856   MONOABS 0.7 10/16/2022 1105   MONOABS 0.3 08/29/2016 0856   EOSABS 0.0 10/16/2022 1105   EOSABS 0.1 08/29/2016 0856   BASOSABS 0.0 10/16/2022 1105   BASOSABS 0.0 08/29/2016 0856   Comprehensive Metabolic Panel:    Component Value Date/Time   NA 148 (H) 10/16/2022 1105   NA 138 09/09/2018 1123   NA 139 08/29/2016 0856   K 3.7 10/16/2022 1105   K 4.0 08/29/2016 0856   CL 109 10/16/2022 1105   CO2 27 10/16/2022 1105   CO2 26 08/29/2016 0856   BUN 18 10/16/2022 1105   BUN 11 09/09/2018 1123   BUN 9.4 08/29/2016 0856   CREATININE 0.81 10/16/2022 1105   CREATININE 0.8 08/29/2016 0856   GLUCOSE 100 (H) 10/16/2022 1105   GLUCOSE 108 08/29/2016 0856   CALCIUM 11.2 (H) 10/16/2022 1105   CALCIUM 9.3 08/29/2016 0856   AST 63 (H) 10/16/2022 1105   AST 16 08/29/2016 0856   ALT 38 10/16/2022 1105   ALT 11 08/29/2016 0856   ALKPHOS 333 (H) 10/16/2022 1105   ALKPHOS 80 08/29/2016 0856   BILITOT 1.1 10/16/2022 1105   BILITOT 0.49 08/29/2016 0856   PROT 7.0 10/16/2022 1105   PROT 7.3 08/29/2016 0856   ALBUMIN 3.5 10/16/2022 1105   ALBUMIN 3.3 (L) 08/29/2016 0856    RADIOGRAPHIC STUDIES: CT ABDOMEN PELVIS W CONTRAST  Result Date: 10/11/2022 CLINICAL DATA:  Two weeks of abdominal pain with nausea and vomiting, bowel obstruction suspected. History of breast cancer EXAM: CT ABDOMEN AND PELVIS WITH CONTRAST TECHNIQUE: Multidetector CT imaging of the abdomen and pelvis was performed using the standard protocol following bolus administration of intravenous contrast. RADIATION DOSE REDUCTION: This exam was performed according to the departmental dose-optimization program which includes automated exposure control, adjustment of the mA and/or kV according to patient size and/or use of iterative  reconstruction technique. CONTRAST:  75m OMNIPAQUE IOHEXOL 350 MG/ML SOLN COMPARISON:  10/05/2021 PET-CT FINDINGS: Lower chest: Innumerable pulmonary nodules consistent with metastatic disease, progressed from comparison. Atherosclerosis of the coronaries. Small sliding hiatal hernia. Hepatobiliary: Hypoenhancing mass in the upper right lobe measuring 2.4 cm, consistent with metastatic disease.Full  gallbladder without calcified stone or acute inflammation. Pancreas: Unremarkable. Spleen: Unremarkable. Adrenals/Urinary Tract: Bilateral adrenal nodules present since at least 2019 CT without hypermetabolism on interval PET, most consistent with adenoma measuring up to 17 mm on the right. No hydronephrosis or stone. Enhancing nodule in the left posterior pararenal space measuring 11 mm, new from prior PET CT and likely a retroperitoneal metastasis. Patchy right renal cortical scarring. Unremarkable bladder. Stomach/Bowel:  No obstruction. No visible inflammation. Vascular/Lymphatic: No acute vascular abnormality. No mass or adenopathy. Reproductive:Fibroids of various density.  No acute finding Other: No ascites or pneumoperitoneum. Musculoskeletal: Extensive sclerotic metastatic disease seen throughout the covered spine and pelvis. Largest deposit in the T12 right eccentric vertebra. No gross compression fracture or extraosseous tumor growth. IMPRESSION: 1. No acute finding.  No bowel obstruction or visible inflammation. 2. Widespread pulmonary and osseous metastatic disease. Milder hepatic metastatic disease. 3. Left posterior pararenal nodule since PET CT 1 year ago, presumed soft tissue metastasis. Electronically Signed   By: Jorje Guild M.D.   On: 10/11/2022 05:28   CT HEAD WO CONTRAST (5MM)  Result Date: 10/11/2022 CLINICAL DATA:  Abdominal pain with nausea and vomiting for 2 weeks. Evaluate for elevated intracranial pressure EXAM: CT HEAD WITHOUT CONTRAST TECHNIQUE: Contiguous axial images were obtained  from the base of the skull through the vertex without intravenous contrast. RADIATION DOSE REDUCTION: This exam was performed according to the departmental dose-optimization program which includes automated exposure control, adjustment of the mA and/or kV according to patient size and/or use of iterative reconstruction technique. COMPARISON:  02/09/2022 head CT and brain MRI FINDINGS: Brain: No evidence of acute infarction, hemorrhage, hydrocephalus, extra-axial collection or mass lesion/mass effect. Small remote left cerebellar infarct. Vascular: No hyperdense vessel or unexpected calcification. Skull: Normal. Negative for fracture or focal lesion. Sinuses/Orbits: Bifrontal sinus opacification since prior. Negative orbits. IMPRESSION: 1. No acute or interval intracranial finding. No hydrocephalus or intracranial mass. 2. Bifrontal sinus opacification since June 2023. Electronically Signed   By: Jorje Guild M.D.   On: 10/11/2022 05:19    PERFORMANCE STATUS (ECOG) : {CHL ONC ECOG [id number]  Review of Systems Unless otherwise noted, a complete review of systems is negative.  Physical Exam General: NAD Cardiovascular: regular rate and rhythm Pulmonary: clear ant fields Abdomen: soft, nontender, + bowel sounds GU: no suprapubic tenderness Extremities: no edema, no joint deformities Skin: no rashes Neurological:  IMPRESSION: *** I introduced myself, Miraya Cudney RN, and Palliative's role in collaboration with the oncology team. Concept of Palliative Care was introduced as specialized medical care for people and their families living with serious illness.  It focuses on providing relief from the symptoms and stress of a serious illness.  The goal is to improve quality of life for both the patient and the family. Values and goals of care important to patient and family were attempted to be elicited.    We discussed *** current illness and what it means in the larger context of Her on-going  co-morbidities. Natural disease trajectory and expectations were discussed.  I discussed the importance of continued conversation with family and their medical providers regarding overall plan of care and treatment options, ensuring decisions are within the context of the patients values and GOCs.  PLAN: Established therapeutic relationship. Education provided on palliative's role in collaboration with their Oncology/Radiation team. I will plan to see patient back in 2-4 weeks in collaboration to other oncology appointments.    Patient expressed understanding and was in agreement with this plan. She  also understands that She can call the clinic at any time with any questions, concerns, or complaints.   Thank you for your referral and allowing Palliative to assist in Mr./Mrs. Doy Mince Touchet's care.   Number and complexity of problems addressed: ***HIGH - 1 or more chronic illnesses with SEVERE exacerbation, progression, or side effects of treatment - advanced cancer, pain. Any controlled substances utilized were prescribed in the context of palliative care.  Time Total: ***  Visit consisted of counseling and education dealing with the complex and emotionally intense issues of symptom management and palliative care in the setting of serious and potentially life-threatening illness.Greater than 50%  of this time was spent counseling and coordinating care related to the above assessment and plan.  Signed by: Alda Lea, AGPCNP-BC Palliative Medicine Team/[place]

## 2022-10-31 NOTE — Progress Notes (Deleted)
Pt wishes to reschedule, direct number provided.

## 2022-10-31 NOTE — Progress Notes (Signed)
Per MD okay to treat with zometa with no dental clearance.

## 2022-10-31 NOTE — Progress Notes (Signed)
Pt presented to infusion today for Zometa infusion.  Pt has very poor venous access and is a hard stick.  IV team was called to obtain access.  MD requesting PA team work on [place] for [employer].  RN sent PA team inbasket with request.

## 2022-11-05 ENCOUNTER — Inpatient Hospital Stay (HOSPITAL_COMMUNITY)
Admission: EM | Admit: 2022-11-05 | Discharge: 2022-12-10 | DRG: 054 | Disposition: E | Payer: 59 | Attending: Internal Medicine | Admitting: Internal Medicine

## 2022-11-05 ENCOUNTER — Emergency Department (HOSPITAL_COMMUNITY): Payer: 59

## 2022-11-05 ENCOUNTER — Other Ambulatory Visit: Payer: Self-pay

## 2022-11-05 ENCOUNTER — Encounter (HOSPITAL_COMMUNITY): Payer: Self-pay | Admitting: Family Medicine

## 2022-11-05 DIAGNOSIS — C7800 Secondary malignant neoplasm of unspecified lung: Secondary | ICD-10-CM | POA: Diagnosis present

## 2022-11-05 DIAGNOSIS — Z87891 Personal history of nicotine dependence: Secondary | ICD-10-CM

## 2022-11-05 DIAGNOSIS — Z888 Allergy status to other drugs, medicaments and biological substances status: Secondary | ICD-10-CM

## 2022-11-05 DIAGNOSIS — K219 Gastro-esophageal reflux disease without esophagitis: Secondary | ICD-10-CM | POA: Diagnosis present

## 2022-11-05 DIAGNOSIS — U071 COVID-19: Secondary | ICD-10-CM | POA: Insufficient documentation

## 2022-11-05 DIAGNOSIS — Z8249 Family history of ischemic heart disease and other diseases of the circulatory system: Secondary | ICD-10-CM

## 2022-11-05 DIAGNOSIS — R4589 Other symptoms and signs involving emotional state: Secondary | ICD-10-CM

## 2022-11-05 DIAGNOSIS — G928 Other toxic encephalopathy: Secondary | ICD-10-CM | POA: Diagnosis present

## 2022-11-05 DIAGNOSIS — T380X5A Adverse effect of glucocorticoids and synthetic analogues, initial encounter: Secondary | ICD-10-CM | POA: Diagnosis present

## 2022-11-05 DIAGNOSIS — E871 Hypo-osmolality and hyponatremia: Secondary | ICD-10-CM | POA: Diagnosis present

## 2022-11-05 DIAGNOSIS — Z79899 Other long term (current) drug therapy: Secondary | ICD-10-CM

## 2022-11-05 DIAGNOSIS — Z6839 Body mass index (BMI) 39.0-39.9, adult: Secondary | ICD-10-CM

## 2022-11-05 DIAGNOSIS — E876 Hypokalemia: Secondary | ICD-10-CM | POA: Diagnosis not present

## 2022-11-05 DIAGNOSIS — Z7189 Other specified counseling: Secondary | ICD-10-CM

## 2022-11-05 DIAGNOSIS — R918 Other nonspecific abnormal finding of lung field: Secondary | ICD-10-CM | POA: Diagnosis present

## 2022-11-05 DIAGNOSIS — R627 Adult failure to thrive: Secondary | ICD-10-CM | POA: Diagnosis present

## 2022-11-05 DIAGNOSIS — F411 Generalized anxiety disorder: Secondary | ICD-10-CM | POA: Diagnosis present

## 2022-11-05 DIAGNOSIS — E875 Hyperkalemia: Secondary | ICD-10-CM | POA: Diagnosis present

## 2022-11-05 DIAGNOSIS — Z79890 Hormone replacement therapy (postmenopausal): Secondary | ICD-10-CM

## 2022-11-05 DIAGNOSIS — C786 Secondary malignant neoplasm of retroperitoneum and peritoneum: Secondary | ICD-10-CM | POA: Diagnosis present

## 2022-11-05 DIAGNOSIS — R079 Chest pain, unspecified: Secondary | ICD-10-CM

## 2022-11-05 DIAGNOSIS — I10 Essential (primary) hypertension: Secondary | ICD-10-CM | POA: Diagnosis present

## 2022-11-05 DIAGNOSIS — J1282 Pneumonia due to coronavirus disease 2019: Secondary | ICD-10-CM | POA: Diagnosis not present

## 2022-11-05 DIAGNOSIS — E039 Hypothyroidism, unspecified: Secondary | ICD-10-CM | POA: Diagnosis present

## 2022-11-05 DIAGNOSIS — E860 Dehydration: Secondary | ICD-10-CM | POA: Diagnosis present

## 2022-11-05 DIAGNOSIS — C787 Secondary malignant neoplasm of liver and intrahepatic bile duct: Secondary | ICD-10-CM | POA: Diagnosis present

## 2022-11-05 DIAGNOSIS — Z515 Encounter for palliative care: Secondary | ICD-10-CM

## 2022-11-05 DIAGNOSIS — Z841 Family history of disorders of kidney and ureter: Secondary | ICD-10-CM

## 2022-11-05 DIAGNOSIS — R531 Weakness: Secondary | ICD-10-CM

## 2022-11-05 DIAGNOSIS — R0602 Shortness of breath: Secondary | ICD-10-CM

## 2022-11-05 DIAGNOSIS — R112 Nausea with vomiting, unspecified: Secondary | ICD-10-CM | POA: Diagnosis not present

## 2022-11-05 DIAGNOSIS — Z86711 Personal history of pulmonary embolism: Secondary | ICD-10-CM | POA: Diagnosis present

## 2022-11-05 DIAGNOSIS — G4733 Obstructive sleep apnea (adult) (pediatric): Secondary | ICD-10-CM | POA: Diagnosis present

## 2022-11-05 DIAGNOSIS — Z781 Physical restraint status: Secondary | ICD-10-CM

## 2022-11-05 DIAGNOSIS — E049 Nontoxic goiter, unspecified: Secondary | ICD-10-CM | POA: Diagnosis present

## 2022-11-05 DIAGNOSIS — C50412 Malignant neoplasm of upper-outer quadrant of left female breast: Secondary | ICD-10-CM | POA: Diagnosis present

## 2022-11-05 DIAGNOSIS — C799 Secondary malignant neoplasm of unspecified site: Secondary | ICD-10-CM

## 2022-11-05 DIAGNOSIS — G9341 Metabolic encephalopathy: Secondary | ICD-10-CM | POA: Diagnosis present

## 2022-11-05 DIAGNOSIS — F05 Delirium due to known physiological condition: Secondary | ICD-10-CM | POA: Diagnosis not present

## 2022-11-05 DIAGNOSIS — Z833 Family history of diabetes mellitus: Secondary | ICD-10-CM

## 2022-11-05 DIAGNOSIS — C7951 Secondary malignant neoplasm of bone: Secondary | ICD-10-CM | POA: Diagnosis present

## 2022-11-05 DIAGNOSIS — R52 Pain, unspecified: Secondary | ICD-10-CM

## 2022-11-05 DIAGNOSIS — C50919 Malignant neoplasm of unspecified site of unspecified female breast: Secondary | ICD-10-CM

## 2022-11-05 DIAGNOSIS — Z822 Family history of deafness and hearing loss: Secondary | ICD-10-CM

## 2022-11-05 DIAGNOSIS — C7931 Secondary malignant neoplasm of brain: Principal | ICD-10-CM | POA: Diagnosis present

## 2022-11-05 DIAGNOSIS — I2782 Chronic pulmonary embolism: Secondary | ICD-10-CM | POA: Diagnosis present

## 2022-11-05 DIAGNOSIS — Z7901 Long term (current) use of anticoagulants: Secondary | ICD-10-CM

## 2022-11-05 DIAGNOSIS — D61818 Other pancytopenia: Secondary | ICD-10-CM | POA: Diagnosis present

## 2022-11-05 DIAGNOSIS — H9190 Unspecified hearing loss, unspecified ear: Secondary | ICD-10-CM | POA: Diagnosis present

## 2022-11-05 DIAGNOSIS — E872 Acidosis, unspecified: Secondary | ICD-10-CM | POA: Diagnosis present

## 2022-11-05 DIAGNOSIS — Z91199 Patient's noncompliance with other medical treatment and regimen due to unspecified reason: Secondary | ICD-10-CM

## 2022-11-05 DIAGNOSIS — Z923 Personal history of irradiation: Secondary | ICD-10-CM

## 2022-11-05 DIAGNOSIS — J189 Pneumonia, unspecified organism: Secondary | ICD-10-CM | POA: Diagnosis not present

## 2022-11-05 DIAGNOSIS — Z66 Do not resuscitate: Secondary | ICD-10-CM

## 2022-11-05 DIAGNOSIS — E785 Hyperlipidemia, unspecified: Secondary | ICD-10-CM | POA: Diagnosis present

## 2022-11-05 HISTORY — DX: Nausea with vomiting, unspecified: R11.2

## 2022-11-05 LAB — RESP PANEL BY RT-PCR (RSV, FLU A&B, COVID)  RVPGX2
Influenza A by PCR: NEGATIVE
Influenza B by PCR: NEGATIVE
Resp Syncytial Virus by PCR: NEGATIVE
SARS Coronavirus 2 by RT PCR: NEGATIVE

## 2022-11-05 LAB — HEPATIC FUNCTION PANEL
ALT: 26 U/L (ref 0–44)
AST: 49 U/L — ABNORMAL HIGH (ref 15–41)
Albumin: 3.3 g/dL — ABNORMAL LOW (ref 3.5–5.0)
Alkaline Phosphatase: 345 U/L — ABNORMAL HIGH (ref 38–126)
Bilirubin, Direct: 0.3 mg/dL — ABNORMAL HIGH (ref 0.0–0.2)
Indirect Bilirubin: 1.2 mg/dL — ABNORMAL HIGH (ref 0.3–0.9)
Total Bilirubin: 1.5 mg/dL — ABNORMAL HIGH (ref 0.3–1.2)
Total Protein: 7.5 g/dL (ref 6.5–8.1)

## 2022-11-05 LAB — CBC
HCT: 40 % (ref 36.0–46.0)
Hemoglobin: 13.8 g/dL (ref 12.0–15.0)
MCH: 33.7 pg (ref 26.0–34.0)
MCHC: 34.5 g/dL (ref 30.0–36.0)
MCV: 97.6 fL (ref 80.0–100.0)
Platelets: 162 10*3/uL (ref 150–400)
RBC: 4.1 MIL/uL (ref 3.87–5.11)
RDW: 16.1 % — ABNORMAL HIGH (ref 11.5–15.5)
WBC: 2.8 10*3/uL — ABNORMAL LOW (ref 4.0–10.5)
nRBC: 1.1 % — ABNORMAL HIGH (ref 0.0–0.2)

## 2022-11-05 LAB — I-STAT VENOUS BLOOD GAS, ED
Acid-Base Excess: 1 mmol/L (ref 0.0–2.0)
Bicarbonate: 23.6 mmol/L (ref 20.0–28.0)
Calcium, Ion: 1.01 mmol/L — ABNORMAL LOW (ref 1.15–1.40)
HCT: 37 % (ref 36.0–46.0)
Hemoglobin: 12.6 g/dL (ref 12.0–15.0)
O2 Saturation: 83 %
Potassium: 2.7 mmol/L — CL (ref 3.5–5.1)
Sodium: 140 mmol/L (ref 135–145)
TCO2: 25 mmol/L (ref 22–32)
pCO2, Ven: 31.5 mmHg — ABNORMAL LOW (ref 44–60)
pH, Ven: 7.482 — ABNORMAL HIGH (ref 7.25–7.43)
pO2, Ven: 44 mmHg (ref 32–45)

## 2022-11-05 LAB — BASIC METABOLIC PANEL
Anion gap: 17 — ABNORMAL HIGH (ref 5–15)
BUN: 6 mg/dL — ABNORMAL LOW (ref 8–23)
CO2: 21 mmol/L — ABNORMAL LOW (ref 22–32)
Calcium: 8.7 mg/dL — ABNORMAL LOW (ref 8.9–10.3)
Chloride: 101 mmol/L (ref 98–111)
Creatinine, Ser: 1.07 mg/dL — ABNORMAL HIGH (ref 0.44–1.00)
GFR, Estimated: 55 mL/min — ABNORMAL LOW (ref >60)
Glucose, Bld: 112 mg/dL — ABNORMAL HIGH (ref 70–99)
Potassium: 2.8 mmol/L — ABNORMAL LOW (ref 3.5–5.1)
Sodium: 139 mmol/L (ref 135–145)

## 2022-11-05 LAB — SALICYLATE LEVEL: Salicylate Lvl: <7 mg/dL — ABNORMAL LOW (ref 7.0–30.0)

## 2022-11-05 LAB — MAGNESIUM: Magnesium: 2.4 mg/dL (ref 1.7–2.4)

## 2022-11-05 LAB — BRAIN NATRIURETIC PEPTIDE: B Natriuretic Peptide: 45.1 pg/mL (ref 0.0–100.0)

## 2022-11-05 LAB — LACTIC ACID, PLASMA: Lactic Acid, Venous: 3.3 mmol/L (ref 0.5–1.9)

## 2022-11-05 LAB — BETA-HYDROXYBUTYRIC ACID: Beta-Hydroxybutyric Acid: 3.06 mmol/L — ABNORMAL HIGH (ref 0.05–0.27)

## 2022-11-05 LAB — CBG MONITORING, ED: Glucose-Capillary: 96 mg/dL (ref 70–99)

## 2022-11-05 LAB — TROPONIN I (HIGH SENSITIVITY)
Troponin I (High Sensitivity): 19 ng/L — ABNORMAL HIGH (ref <18)
Troponin I (High Sensitivity): 20 ng/L — ABNORMAL HIGH (ref <18)

## 2022-11-05 LAB — LIPASE, BLOOD: Lipase: 55 U/L — ABNORMAL HIGH (ref 11–51)

## 2022-11-05 LAB — ACETAMINOPHEN LEVEL: Acetaminophen (Tylenol), Serum: <10 ug/mL — ABNORMAL LOW (ref 10–30)

## 2022-11-05 MED ORDER — ACETAMINOPHEN 325 MG PO TABS
650.0000 mg | ORAL_TABLET | Freq: Four times a day (QID) | ORAL | Status: DC | PRN
  Administered 2022-11-06 – 2022-11-20 (×12): 650 mg via ORAL
  Filled 2022-11-05 (×11): qty 2

## 2022-11-05 MED ORDER — POTASSIUM CHLORIDE CRYS ER 20 MEQ PO TBCR
40.0000 meq | EXTENDED_RELEASE_TABLET | Freq: Once | ORAL | Status: AC
  Administered 2022-11-05: 40 meq via ORAL
  Filled 2022-11-05: qty 2

## 2022-11-05 MED ORDER — MAGNESIUM SULFATE IN D5W 1-5 GM/100ML-% IV SOLN
1.0000 g | Freq: Once | INTRAVENOUS | Status: AC
  Administered 2022-11-05: 1 g via INTRAVENOUS
  Filled 2022-11-05: qty 100

## 2022-11-05 MED ORDER — LACTATED RINGERS IV BOLUS
1000.0000 mL | Freq: Once | INTRAVENOUS | Status: AC
  Administered 2022-11-05: 1000 mL via INTRAVENOUS

## 2022-11-05 MED ORDER — ACETAMINOPHEN 650 MG RE SUPP: 650.0000 mg | Freq: Four times a day (QID) | RECTAL | Status: DC | PRN

## 2022-11-05 MED ORDER — SENNOSIDES-DOCUSATE SODIUM 8.6-50 MG PO TABS: 1.0000 | ORAL_TABLET | Freq: Every evening | ORAL | Status: DC | PRN

## 2022-11-05 MED ORDER — LACTATED RINGERS IV BOLUS
500.0000 mL | Freq: Once | INTRAVENOUS | Status: AC
  Administered 2022-11-05: 500 mL via INTRAVENOUS

## 2022-11-05 MED ORDER — IOHEXOL 350 MG/ML SOLN
75.0000 mL | Freq: Once | INTRAVENOUS | Status: AC | PRN
  Administered 2022-11-05: 75 mL via INTRAVENOUS

## 2022-11-05 MED ORDER — ONDANSETRON HCL 4 MG/2ML IJ SOLN: 4.0000 mg | Freq: Four times a day (QID) | INTRAMUSCULAR | Status: DC | PRN

## 2022-11-05 MED ORDER — METOCLOPRAMIDE HCL 5 MG/ML IJ SOLN
10.0000 mg | Freq: Once | INTRAMUSCULAR | Status: AC
  Administered 2022-11-05: 10 mg via INTRAVENOUS
  Filled 2022-11-05: qty 2

## 2022-11-05 MED ORDER — POTASSIUM CHLORIDE IN NACL 20-0.9 MEQ/L-% IV SOLN
INTRAVENOUS | Status: DC
  Filled 2022-11-05 (×11): qty 1000

## 2022-11-05 MED ORDER — IOHEXOL 350 MG/ML SOLN
80.0000 mL | Freq: Once | INTRAVENOUS | Status: AC | PRN
  Administered 2022-11-05: 80 mL via INTRAVENOUS

## 2022-11-05 MED ORDER — ONDANSETRON HCL 4 MG PO TABS: 4.0000 mg | ORAL_TABLET | Freq: Four times a day (QID) | ORAL | Status: DC | PRN

## 2022-11-05 MED ORDER — POTASSIUM CHLORIDE 10 MEQ/100ML IV SOLN
10.0000 meq | INTRAVENOUS | Status: AC
  Administered 2022-11-05 (×3): 10 meq via INTRAVENOUS
  Filled 2022-11-05 (×4): qty 100

## 2022-11-05 NOTE — Progress Notes (Signed)
  Evaluation after Contrast Extravasation  Patient seen and examined immediately after contrast extravasation while in CT3. IV has been removed from right arm just above AC. New IV not started yet. Per CT tech 75 cc contrast given with portion being seen on imaging so unsure how much is in the SQ.   Exam: There is no swelling at the area just above the right AC.  There is no erythema. There is no discoloration. There are no blisters. There are no signs of decreased perfusion of the skin.  It is warm to touch.  The patient has full ROM in fingers.  Radial pulse  normal.  The arm is soft after massaging and there is no appreciable contrast remaining in the SQ on my exam.  No need for additional intervention at this time. Recommend periodic monitoring of the site PRN.  Please call IR with any questions or concerns.   Joaquim Nam PA-C 11/05/2022 3:22 PM

## 2022-11-05 NOTE — ED Notes (Signed)
MD notified that patients medications and scans were delayed due to patient taken to CT and 20 G IV in right AC infiltrated during contrast administration. Pt had left arm restriction from previous surgery and needed a 20G or more in [facility] or higher so IV team was consulted.

## 2022-11-05 NOTE — ED Provider Notes (Signed)
[facility] Provider Note   CSN: [id number] Arrival date & time: 11/05/22  1049     History  Chief Complaint  Patient presents with   Shortness of Breath    Sheila Jacobs is a 72 y.o. female.  HPI Patient presents for chest pain.  Medical history includes HTN, HLD, GERD, goiter, OSA, prior PE, arthritis.  Prescribed Eliquis.  EMS reports that she has had exertional shortness of breath over the past week.  Onset of chest pain was this morning.  Patient was given ASA and NTG prior to arrival.  History from patient is limited by her severe hearing loss.  She states that she is currently chest pain-free and does confirm that onset of chest pain was this morning.  History per grandson: Patient has had nausea, vomiting, and poor p.o. tolerance over the past 4 days.  She was complaining of a generalized abdominal pain which she attributed to a new medication that she was started on by her cancer doctor.  Per chart review, I do not see this new medication.  Patient's grandson confirmed that she had new onset chest pain today.  Her shortness of breath is exertional and has been ongoing over several weeks or months.    Home Medications Prior to Admission medications   Medication Sig Start Date End Date Taking? Authorizing Provider  abemaciclib (VERZENIO) 100 MG tablet Take 1 tablet (100 mg total) by mouth 2 (two) times daily. Swallow tablets whole. Do not chew, crush, or split tablets before swallowing. 12/12/21   Nicholas Lose, MD  acetaminophen (TYLENOL) 500 MG tablet Take 500 mg by mouth every 6 (six) hours as needed for moderate pain or headache.    [provider]  amLODipine (NORVASC) 5 MG tablet Take 1 tablet (5 mg total) by mouth daily. 09/19/22 10/19/22  Fransico Meadow, MD  Cholecalciferol (VITAMIN D) 125 MCG (5000 UT) CAPS Take 5,000 Units by mouth daily.    [provider]  ELIQUIS 5 MG TABS tablet TAKE 1 TABLET BY MOUTH  TWICE A DAY 04/10/22   Minus Breeding, MD  Ferrous Gluconate (IRON 27 PO) Take 1 tablet by mouth 2 (two) times daily.    [provider]  gabapentin (NEURONTIN) 100 MG capsule Take 100 mg by mouth at bedtime. 08/28/20   [provider]  letrozole (FEMARA) 2.5 MG tablet Take 1 tablet (2.5 mg total) by mouth daily. 10/16/22   Nicholas Lose, MD  levothyroxine (SYNTHROID) 100 MCG tablet Take 1 tablet (100 mcg total) by mouth daily before breakfast. 08/01/22   Barrett Henle, MD  metoprolol succinate (TOPROL-XL) 50 MG 24 hr tablet Take 50 mg by mouth daily. 06/22/20   [provider]  nitroGLYCERIN (NITROSTAT) 0.4 MG SL tablet 0.4 mg every 5 (five) minutes as needed for chest pain. 07/17/21   [provider]  omeprazole (PRILOSEC) 20 MG capsule Take 20 mg by mouth daily.    [provider]  ondansetron (ZOFRAN-ODT) 4 MG disintegrating tablet Take 1 tablet (4 mg total) by mouth every 8 (eight) hours as needed for nausea or vomiting. 10/11/22   Maudie Flakes, MD  potassium chloride SA (KLOR-CON M) 20 MEQ tablet Take 1 tablet (20 mEq total) by mouth 2 (two) times daily for 4 days. 09/19/22 09/23/22  Fransico Meadow, MD  triamcinolone cream (KENALOG) 0.1 % 1 application daily as needed (Rash). 07/24/21   [provider]  vitamin B-12 (CYANOCOBALAMIN) 1000 MCG  tablet Take 1,000 mcg by mouth daily.    [provider]      Allergies    Cortisone, Effexor [venlafaxine], Lexapro [escitalopram], and Xarelto [rivaroxaban]    Review of Systems   Review of Systems  Respiratory:  Positive for shortness of breath.   Cardiovascular:  Positive for chest pain.  All other systems reviewed and are negative.   Physical Exam Updated Vital Signs BP 110/69   Pulse 86   Temp 97.8 F (36.6 C) (Oral)   Resp 14   Ht '5\' 3"'$  (1.6 m)   Wt 100.7 kg   SpO2 98%   BMI 39.33 kg/m  Physical Exam Vitals and nursing note reviewed.  Constitutional:       General: She is not in acute distress.    Appearance: Normal appearance. She is well-developed. She is ill-appearing (Chronically). She is not toxic-appearing or diaphoretic.  HENT:     Head: Normocephalic and atraumatic.     Right Ear: External ear normal.     Left Ear: External ear normal.     Nose: Nose normal.     Mouth/Throat:     Mouth: Mucous membranes are moist.  Eyes:     Extraocular Movements: Extraocular movements intact.     Conjunctiva/sclera: Conjunctivae normal.  Cardiovascular:     Rate and Rhythm: Normal rate and regular rhythm.     Heart sounds: No murmur heard. Pulmonary:     Effort: Pulmonary effort is normal. Tachypnea present. No respiratory distress.     Breath sounds: Normal breath sounds. No decreased breath sounds, wheezing, rhonchi or rales.  Chest:     Chest wall: No tenderness.  Abdominal:     General: There is no distension.     Palpations: Abdomen is soft.     Tenderness: There is no abdominal tenderness.  Musculoskeletal:        General: No swelling. Normal range of motion.     Cervical back: Normal range of motion and neck supple.     Right lower leg: No edema.     Left lower leg: No edema.  Skin:    General: Skin is warm and dry.     Coloration: Skin is not jaundiced or pale.  Neurological:     General: No focal deficit present.     Mental Status: She is alert.  Psychiatric:        Mood and Affect: Mood normal.        Behavior: Behavior normal.     ED Results / Procedures / Treatments   Labs (all labs ordered are listed, but only abnormal results are displayed) Labs Reviewed  BASIC METABOLIC PANEL - Abnormal; Notable for the following components:      Result Value   Potassium 2.8 (*)    CO2 21 (*)    Glucose, Bld 112 (*)    BUN 6 (*)    Creatinine, Ser 1.07 (*)    Calcium 8.7 (*)    GFR, Estimated 55 (*)    Anion gap 17 (*)    All other components within normal limits  CBC - Abnormal; Notable for the following components:    WBC 2.8 (*)    RDW 16.1 (*)    nRBC 1.1 (*)    All other components within normal limits  HEPATIC FUNCTION PANEL - Abnormal; Notable for the following components:   Albumin 3.3 (*)    AST 49 (*)    Alkaline Phosphatase 345 (*)    Total  Bilirubin 1.5 (*)    Bilirubin, Direct 0.3 (*)    Indirect Bilirubin 1.2 (*)    All other components within normal limits  LIPASE, BLOOD - Abnormal; Notable for the following components:   Lipase 55 (*)    All other components within normal limits  LACTIC ACID, PLASMA - Abnormal; Notable for the following components:   Lactic Acid, Venous 3.3 (*)    All other components within normal limits  BETA-HYDROXYBUTYRIC ACID - Abnormal; Notable for the following components:   Beta-Hydroxybutyric Acid 3.06 (*)    All other components within normal limits  SALICYLATE LEVEL - Abnormal; Notable for the following components:   Salicylate Lvl [id number] (*)    All other components within normal limits  ACETAMINOPHEN LEVEL - Abnormal; Notable for the following components:   Acetaminophen (Tylenol), Serum <10 (*)    All other components within normal limits  I-STAT VENOUS BLOOD GAS, ED - Abnormal; Notable for the following components:   pH, Ven 7.482 (*)    pCO2, Ven 31.5 (*)    Potassium 2.7 (*)    Calcium, Ion 1.01 (*)    All other components within normal limits  TROPONIN I (HIGH SENSITIVITY) - Abnormal; Notable for the following components:   Troponin I (High Sensitivity) 19 (*)    All other components within normal limits  TROPONIN I (HIGH SENSITIVITY) - Abnormal; Notable for the following components:   Troponin I (High Sensitivity) 20 (*)    All other components within normal limits  RESP PANEL BY RT-PCR (RSV, FLU A&B, COVID)  RVPGX2  MAGNESIUM  BRAIN NATRIURETIC PEPTIDE  LACTIC ACID, PLASMA  CBG MONITORING, ED    EKG EKG Interpretation  Date/Time:  Monday November 05 2022 11:05:45 EST Ventricular Rate:  93 PR Interval:  174 QRS Duration: 96 QT  Interval:  377 QTC Calculation: 469 R Axis:   -81 Text Interpretation: Sinus rhythm Left anterior fascicular block Right ventricular hypertrophy Confirmed by Godfrey Pick ([id number]) on 11/05/2022 11:12:44 AM  Radiology DG Chest 2 View  Result Date: 11/05/2022 CLINICAL DATA:  Chest pain. Shortness of breath x1 week with chest pain x1 day. History of stage IV breast cancer. EXAM: CHEST - 2 VIEW COMPARISON:  Chest radiograph 11/14/2021.  Chest CT 10/31/2021. FINDINGS: Interval increase in diffuse nodular opacities throughout both lungs, may reflect worsening metastatic disease versus superimposed infection or edema. Stable cardiac and mediastinal contours. Possible small left pleural effusion. No pneumothorax. IMPRESSION: Interval increase in diffuse nodular opacities throughout both lungs, may reflect worsening metastatic disease versus superimposed infection or edema. Electronically Signed   By: Emmit Alexanders M.D.   On: 11/05/2022 11:59    Procedures Procedures    Medications Ordered in ED Medications  potassium chloride 10 mEq in 100 mL IVPB (has no administration in time range)  metoCLOPramide (REGLAN) injection 10 mg (has no administration in time range)  lactated ringers bolus 500 mL (has no administration in time range)  lactated ringers bolus 1,000 mL (has no administration in time range)  potassium chloride SA (KLOR-CON M) CR tablet 40 mEq (40 mEq Oral Given 11/05/22 1330)    ED Course/ Medical Decision Making/ A&P                             Medical Decision Making Amount and/or Complexity of Data Reviewed Labs: ordered. Radiology: ordered.  Risk Prescription drug management.   This patient presents to the ED for concern of  nausea, vomiting, fatigue, chest pain, this involves an extensive number of treatment options, and is a complaint that carries with it a high risk of complications and morbidity.  The differential diagnosis includes medication side effect, worsening of cancer,  ACS, SBO, GERD, gastritis, intra-abdominal infection, metabolic derangements, dehydration, PE   Co morbidities that complicate the patient evaluation  Metastatic breast cancer HTN, HLD, GERD, goiter, OSA, prior PE, arthritis   Additional history obtained:  Additional history obtained from EMS, patient's grandson External records from outside source obtained and reviewed including EMR   Lab Tests:  I Ordered, and personally interpreted labs.  The pertinent results include: Neutropenia, normal hemoglobin, hypokalemia, mild metabolic acidosis, ketonemia, lactic acidosis   Imaging Studies ordered:  I ordered imaging studies including chest x-ray, CTA chest, CT of abdomen and pelvis I independently visualized and interpreted imaging which showed increased diffuse nodular opacities are present on chest x-ray.  CT imaging is pending at time of signout. I agree with the radiologist interpretation   Cardiac Monitoring: / EKG:  The patient was maintained on a cardiac monitor.  I personally viewed and interpreted the cardiac monitored which showed an underlying rhythm of: Sinus rhythm  Problem List / ED Course / Critical interventions / Medication management  Patient presenting for acute chest pain starting this morning.  Additional symptoms include exertional shortness of breath over the past week.  She does arrive from home by EMS.  EMS noted soft blood pressures that softened after 1 SL NTG.  She was given 324 of ASA prior to arrival.  On arrival, patient denies any ongoing chest pain.  Vital signs are reassuring.  Laboratory workup was initiated.  BMP is notable for hypokalemia and a mild anion gap metabolic acidosis.  Lipase is only slightly elevated above normal.  Troponin is high-normal.  Patient has a leukopenia which appears to be new.  COVID and flu testing were negative.  Patient was accompanied in the ED by her grandson, who she lives with.  He was able to provide further history.   It seems that she has had nausea, vomiting, and poor p.o. tolerance over the past 4 days.  While in the ED, patient had an episode of emesis.  Emesis was nonbloody and slightly green in color.  Reglan was ordered for nausea.  CT imaging of abdomen and pelvis was ordered.  Emesis occurred shortly after she got her oral potassium replacement.  IV potassium replacement was ordered.  Patient was found to have ketonemia.  Of note, glucose was normal.  I suspect starvation ketosis given her severely poor p.o. intake over the past 4 days.  Initial lactate was elevated.  Additional IV fluids were ordered.  At time of signout, CT imaging results were pending.  Care of patient was signed out to oncoming ED provider. I ordered medication including IV fluids for hydration; Reglan for nausea; potassium chloride for hypokalemia. Reevaluation of the patient after these medicines showed that the patient improved I have reviewed the patients home medicines and have made adjustments as needed   Social Determinants of Health:  Lives at home with family, has access to outpatient care         Final Clinical Impression(s) / ED Diagnoses Final diagnoses:  Nausea and vomiting, unspecified vomiting type  Hypokalemia  Chest pain, unspecified type    Rx / DC Orders ED Discharge Orders     None         Godfrey Pick, MD 11/05/22 1554

## 2022-11-05 NOTE — ED Notes (Signed)
Pt transported to CT ?

## 2022-11-05 NOTE — ED Provider Notes (Signed)
Care transferred to me.  CT scans are overall unremarkable besides metastatic disease.  Overall she seems to have some starvation ketosis and hypokalemia.  Will give electrolyte replacement and fluids.  Will need admission.  Discussed with Dr. Claria Dice. Updated daughter   Angiocath insertion Performed by: Ephraim Hamburger  Consent: Verbal consent obtained. Risks and benefits: risks, benefits and alternatives were discussed Time out: Immediately prior to procedure a "time out" was called to verify the correct patient, procedure, equipment, support staff and site/side marked as required.  Preparation: Patient was prepped and draped in the usual sterile fashion.  Vein Location: left basilic  Ultrasound Guided  Gauge: 18  Normal blood return and flush without difficulty Patient tolerance: Patient tolerated the procedure well with no immediate complications.     Sherwood Gambler, MD 11/05/22 [phone]

## 2022-11-05 NOTE — H&P (Incomplete)
PCP:   Arthur Holms, NP   Chief Complaint:  Chest pains  HPI: This is a pleasant 72 year old female with past medical history of HTN, HLD, GERD, goiter, OSA, prior PE, arthritis, and metastatic cancer.  Patient unable to provide history.  Daughter [name] contacted, states patient sent in because she complained of chest pain.  She is unable to provide any additional history.  She states she is unaware that the patient had nausea or vomiting.  Review of Systems:  Unable to obtain. Chest pains  Past Medical History: Past Medical History:  Diagnosis Date   Acute saddle pulmonary embolism without acute cor pulmonale (HCC) 12/29/2017   Anginal pain (HCC)    No angiographic evidence of CAD by cath 10/21/14   Arthritis    Breast cancer of upper-outer quadrant of left female breast ([place]) 01/31/2016   Colon polyps    GERD (gastroesophageal reflux disease)    Hearing deficit    Pt reports wearing hearing aides   Hearing loss    bilateral   History of radiation therapy 10/9-11/20/17   Left Breast 45 Gy in 25 fractions, Left Breast Boost 6 Gy in 3 fractions.    Hyperlipidemia    Hypertension    Morbid obesity (HCC)    Nausea and vomiting 11/05/2022   OSA (obstructive sleep apnea) 01/23/2018   uses cpap   Palpitations 10/16/2016   Personal history of noncompliance with medical treatment, presenting hazards to health 08/27/2016   Personal history of radiation therapy    Thyroid goiter    Past Surgical History:  Procedure Laterality Date   BREAST BIOPSY Left 01/2016   BREAST LUMPECTOMY Left 04/2016   BREAST LUMPECTOMY WITH RADIOACTIVE SEED AND SENTINEL LYMPH NODE BIOPSY Left 04/11/2016   Procedure: BREAST LUMPECTOMY WITH RADIOACTIVE SEED AND SENTINEL LYMPH NODE BIOPSY;  Surgeon: Excell Seltzer, MD;  Location: [facility];  Service: General;  Laterality: Left;   BRONCHIAL BIOPSY  11/14/2021   Procedure: BRONCHIAL BIOPSIES;  Surgeon: Garner Nash, DO;  Location: [facility];  Service:  Pulmonary;;   BRONCHIAL BRUSHINGS  11/14/2021   Procedure: BRONCHIAL BRUSHINGS;  Surgeon: Garner Nash, DO;  Location: [facility];  Service: Pulmonary;;   BRONCHIAL NEEDLE ASPIRATION BIOPSY  11/14/2021   Procedure: BRONCHIAL NEEDLE ASPIRATION BIOPSIES;  Surgeon: Garner Nash, DO;  Location: [facility];  Service: Pulmonary;;   COLONOSCOPY WITH PROPOFOL N/A 10/20/2015   Procedure: COLONOSCOPY WITH PROPOFOL;  Surgeon: Mauri Pole, MD;  Location: [facility];  Service: Endoscopy;  Laterality: N/A;   ESOPHAGOGASTRODUODENOSCOPY (EGD) WITH PROPOFOL N/A 10/20/2015   Procedure: ESOPHAGOGASTRODUODENOSCOPY (EGD) WITH PROPOFOL;  Surgeon: Mauri Pole, MD;  Location: [facility];  Service: Endoscopy;  Laterality: N/A;   IR ANGIOGRAM PULMONARY BILATERAL SELECTIVE  12/29/2017   IR ANGIOGRAM SELECTIVE EACH ADDITIONAL VESSEL  12/29/2017   IR ANGIOGRAM SELECTIVE EACH ADDITIONAL VESSEL  12/29/2017   IR INFUSION THROMBOL ARTERIAL INITIAL (MS)  12/29/2017   IR INFUSION THROMBOL ARTERIAL INITIAL (MS)  12/29/2017   IR THROMB F/U EVAL ART/VEN FINAL DAY (MS)  12/30/2017   IR US GUIDE VASC ACCESS RIGHT  12/29/2017   LEFT HEART CATHETERIZATION WITH CORONARY ANGIOGRAM N/A 10/21/2014   Procedure: LEFT HEART CATHETERIZATION WITH CORONARY ANGIOGRAM;  Surgeon: Burnell Blanks, MD;  Location: [facility];  Service: Cardiovascular;  Laterality: N/A;   THYROIDECTOMY N/A 02/26/2017   Procedure: TOTAL THYROIDECTOMY;  Surgeon: Excell Seltzer, MD;  Location: [facility];  Service: General;  Laterality: N/A;   TUBAL LIGATION  VIDEO BRONCHOSCOPY WITH RADIAL ENDOBRONCHIAL ULTRASOUND  11/14/2021   Procedure: RADIAL ENDOBRONCHIAL ULTRASOUND;  Surgeon: Garner Nash, DO;  Location: [facility];  Service: Pulmonary;;    Medications: Prior to Admission medications   Medication Sig Start Date End Date Taking? Authorizing Provider  abemaciclib (VERZENIO) 100 MG tablet Take 1 tablet (100 mg total) by mouth 2 (two)  times daily. Swallow tablets whole. Do not chew, crush, or split tablets before swallowing. 12/12/21   Nicholas Lose, MD  acetaminophen (TYLENOL) 500 MG tablet Take 500 mg by mouth every 6 (six) hours as needed for moderate pain or headache.    [provider]  amLODipine (NORVASC) 5 MG tablet Take 1 tablet (5 mg total) by mouth daily. 09/19/22 10/19/22  Fransico Meadow, MD  Cholecalciferol (VITAMIN D) 125 MCG (5000 UT) CAPS Take 5,000 Units by mouth daily.    [provider]  ELIQUIS 5 MG TABS tablet TAKE 1 TABLET BY MOUTH TWICE A DAY 04/10/22   Minus Breeding, MD  Ferrous Gluconate (IRON 27 PO) Take 1 tablet by mouth 2 (two) times daily.    [provider]  gabapentin (NEURONTIN) 100 MG capsule Take 100 mg by mouth at bedtime. 08/28/20   [provider]  letrozole (FEMARA) 2.5 MG tablet Take 1 tablet (2.5 mg total) by mouth daily. 10/16/22   Nicholas Lose, MD  levothyroxine (SYNTHROID) 100 MCG tablet Take 1 tablet (100 mcg total) by mouth daily before breakfast. 08/01/22   Barrett Henle, MD  metoprolol succinate (TOPROL-XL) 50 MG 24 hr tablet Take 50 mg by mouth daily. 06/22/20   [provider]  nitroGLYCERIN (NITROSTAT) 0.4 MG SL tablet 0.4 mg every 5 (five) minutes as needed for chest pain. 07/17/21   [provider]  omeprazole (PRILOSEC) 20 MG capsule Take 20 mg by mouth daily.    [provider]  ondansetron (ZOFRAN-ODT) 4 MG disintegrating tablet Take 1 tablet (4 mg total) by mouth every 8 (eight) hours as needed for nausea or vomiting. 10/11/22   Maudie Flakes, MD  potassium chloride SA (KLOR-CON M) 20 MEQ tablet Take 1 tablet (20 mEq total) by mouth 2 (two) times daily for 4 days. 09/19/22 09/23/22  Fransico Meadow, MD  triamcinolone cream (KENALOG) 0.1 % 1 application daily as needed (Rash). 07/24/21   [provider]  vitamin B-12 (CYANOCOBALAMIN) 1000 MCG tablet Take 1,000 mcg by mouth daily.    [provider]    Allergies:   Allergies  Allergen Reactions   Cortisone Rash   Effexor [Venlafaxine] Palpitations   Lexapro [Escitalopram] Palpitations    Heart racing   Xarelto [Rivaroxaban] Rash    Social History:  reports that she quit smoking about 8 years ago. Her smoking use included cigarettes. She has a 45.00 pack-year smoking history. She has never used smokeless tobacco. She reports that she does not drink alcohol and does not use drugs.  Family History: Family History  Problem Relation Age of Onset   Hypertension Mother    Heart disease Mother        Pacemaker   Kidney disease Mother    Cataracts Mother    Hearing loss Mother    Diabetes Mother    Liver disease Mother    Diabetes Sister    Cataracts Brother    Hearing loss Brother    Colon cancer Neg Hx    Thyroid disease Neg Hx     Physical Exam: Vitals:   11/05/22 1530  11/05/22 1630 11/05/22 1900 11/05/22 2001  BP: 110/69 111/75  121/68  Pulse: 86 93  80  Resp: '14 17  17  '$ Temp:   97.6 F (36.4 C) 97.7 F (36.5 C)  TempSrc:   Oral Oral  SpO2: 98% 100%  98%  Weight:      Height:        General:  Alert and confused female, MO, no acute distress Eyes: PERRLA, pink conjunctiva, no scleral icterus ENT: Moist oral mucosa, neck supple, swollen bottom lip Lungs: clear to ascultation, no wheeze, no crackles, no use of accessory muscles Cardiovascular: regular rate and rhythm, no regurgitation, no gallops, no murmurs. No carotid bruits, no JVD Abdomen: soft, positive BS, non-tender, non-distended, no organomegaly, not an acute abdomen GU: not examined Neuro: appears CN II - XII grossly intact, Musculoskeletal: strength 5/5 all extremities, no edema, globally weak Skin: no rash, no subcutaneous crepitation, no decubitus Psych: delayed, indirect patient   Labs on Admission:  Recent Labs    11/05/22 1115 11/05/22 1355  NA 139 140  K 2.8* 2.7*  CL 101  --   CO2 21*  --   GLUCOSE 112*  --   BUN 6*  --    CREATININE 1.07*  --   CALCIUM 8.7*  --   MG 2.4  --    Recent Labs    11/05/22 1115  AST 49*  ALT 26  ALKPHOS 345*  BILITOT 1.5*  PROT 7.5  ALBUMIN 3.3*   Recent Labs    11/05/22 1115  LIPASE 55*   Recent Labs    11/05/22 1115 11/05/22 1355  WBC 2.8*  --   HGB 13.8 12.6  HCT 40.0 37.0  MCV 97.6  --   PLT 162  --      Micro Results: Recent Results (from the past 240 hour(s))  Resp panel by RT-PCR (RSV, Flu A&B, Covid) Anterior Nasal Swab     Status: None   Collection Time: 11/05/22 12:08 PM   Specimen: Anterior Nasal Swab  Result Value Ref Range Status   SARS Coronavirus 2 by RT PCR NEGATIVE NEGATIVE Final   Influenza A by PCR NEGATIVE NEGATIVE Final   Influenza B by PCR NEGATIVE NEGATIVE Final    Comment: (NOTE) The Xpert Xpress SARS-CoV-2/FLU/RSV plus assay is intended as an aid in the diagnosis of influenza from Nasopharyngeal swab specimens and should not be used as a sole basis for treatment. Nasal washings and aspirates are unacceptable for Xpert Xpress SARS-CoV-2/FLU/RSV testing.  Fact Sheet for Patients: EntrepreneurPulse.com.au  Fact Sheet for Healthcare Providers: IncredibleEmployment.be  This test is not yet approved or cleared by the [place] FDA and has been authorized for detection and/or diagnosis of SARS-CoV-2 by FDA under an Emergency Use Authorization (EUA). This EUA will remain in effect (meaning this test can be used) for the duration of the COVID-19 declaration under Section 564(b)(1) of the Act, 21 U.S.C. section 360bbb-3(b)(1), unless the authorization is terminated or revoked.     Resp Syncytial Virus by PCR NEGATIVE NEGATIVE Final    Comment: (NOTE) Fact Sheet for Patients: EntrepreneurPulse.com.au  Fact Sheet for Healthcare Providers: IncredibleEmployment.be  This test is not yet approved or cleared by the [place] FDA and has been  authorized for detection and/or diagnosis of SARS-CoV-2 by FDA under an Emergency Use Authorization (EUA). This EUA will remain in effect (meaning this test can be used) for the duration of the COVID-19 declaration under Section 564(b)(1) of the Act, 21 U.S.C.  section 360bbb-3(b)(1), unless the authorization is terminated or revoked.  Performed at [facility], [place] [address] [place], [place] [address]      Radiological Exams on Admission: CT ABDOMEN PELVIS W CONTRAST  Result Date: 11/05/2022 CLINICAL DATA:  Abdominal pain. Acute abdominal pain nonlocalized. Stage IV breast cancer with lung metastasis. * Tracking Code: BO *. EXAM: CT ANGIOGRAPHY CHEST CT ABDOMEN AND PELVIS WITH CONTRAST TECHNIQUE: Multidetector CT imaging of the chest was performed using the standard protocol during bolus administration of intravenous contrast. Multiplanar CT image reconstructions and MIPs were obtained to evaluate the vascular anatomy. Multidetector CT imaging of the abdomen and pelvis was performed using the standard protocol during bolus administration of intravenous contrast. RADIATION DOSE REDUCTION: This exam was performed according to the departmental dose-optimization program which includes automated exposure control, adjustment of the mA and/or kV according to patient size and/or use of iterative reconstruction technique. CONTRAST:  19m OMNIPAQUE IOHEXOL 350 MG/ML SOLN COMPARISON:  CT chest 11/05/2022, CT abdomen pelvis 10/11/2022 FINDINGS: CTA CHEST FINDINGS Cardiovascular: No filling defects within the pulmonary arteries to suggest acute pulmonary embolism. Pulmonary artery is dilated 4.6 cm. Mediastinum/Nodes: Enlarged supraclavicular nodes. RIGHT hilar mild adenopathy. RIGHT hilar node measures 11 mm. Mild subcarinal paratracheal adenopathy. No change from recent exam Lungs/Pleura: Innumerable bilateral pulmonary nodules of varying size scattered throughout the LEFT and RIGHT lung. No change from  recent exam same day. No pulmonary infarction. Musculoskeletal: Multifocal sclerotic skeletal metastasis again noted. Review of the MIP images confirms the above findings. CT ABDOMEN and PELVIS FINDINGS Hepatobiliary: Large hepatic metastasis in the RIGHT hepatic lobe measuring 23 mm (image 9/3). Smaller metastatic lesion along the falciform ligament measuring 8 mm on image 17/3). Gallbladder distended. Pancreas: Pancreas is normal. No ductal dilatation. No pancreatic inflammation. Spleen: Normal spleen Adrenals/urinary tract: Bilateral enlargement of the adrenal glands consistent metastatic disease. Findings similar to comparison CT No ureteral obstruction.  Bladder normal. Stomach/Bowel: Stomach, small bowel, appendix, and cecum are normal. The colon and rectosigmoid colon are normal. Vascular/Lymphatic: Abdominal aorta is normal caliber with atherosclerotic calcification. There is no retroperitoneal or periportal lymphadenopathy. No pelvic lymphadenopathy. Reproductive: Uterus and adnexa unremarkable. Uterus has a lobular contour consistent leiomyoma. Other: Is rounded nodule in the ventral LEFT peritoneal space (image 71/3 no change Musculoskeletal: Multifocal sclerotic skeletal metastasis no change Review of the MIP images confirms the above findings. IMPRESSION: Chest Impression: 1. No evidence acute pulmonary embolism. 2. Extensive bilateral multifocal pulmonary metastasis. 3. Enlargement of the pulmonary suggest pulmonary hypertension. 4. Mild metastatic mediastinal, supraclavicular and RIGHT axillary nodal metastasis Abdomen / Pelvis Impression: 1. hepatic metastasis stable 2. Bilateral adrenal metastasis.  No change 3. Small peritoneal nodule in the LEFT lower quadrant suggests peritoneal metastasis. 4. Widespread sclerotic skeletal metastasis unchanged. 5. No acute findings in the abdomen pelvis. Electronically Signed   By: SSuzy BouchardM.D.   On: 11/05/2022 18:15   CT Angio Chest Pulmonary Embolism  (PE) W or WO Contrast  Result Date: 11/05/2022 CLINICAL DATA:  Abdominal pain. Acute abdominal pain nonlocalized. Stage IV breast cancer with lung metastasis. * Tracking Code: BO *. EXAM: CT ANGIOGRAPHY CHEST CT ABDOMEN AND PELVIS WITH CONTRAST TECHNIQUE: Multidetector CT imaging of the chest was performed using the standard protocol during bolus administration of intravenous contrast. Multiplanar CT image reconstructions and MIPs were obtained to evaluate the vascular anatomy. Multidetector CT imaging of the abdomen and pelvis was performed using the standard protocol during bolus administration of intravenous contrast. RADIATION DOSE REDUCTION:  This exam was performed according to the departmental dose-optimization program which includes automated exposure control, adjustment of the mA and/or kV according to patient size and/or use of iterative reconstruction technique. CONTRAST:  45m OMNIPAQUE IOHEXOL 350 MG/ML SOLN COMPARISON:  CT chest 11/05/2022, CT abdomen pelvis 10/11/2022 FINDINGS: CTA CHEST FINDINGS Cardiovascular: No filling defects within the pulmonary arteries to suggest acute pulmonary embolism. Pulmonary artery is dilated 4.6 cm. Mediastinum/Nodes: Enlarged supraclavicular nodes. RIGHT hilar mild adenopathy. RIGHT hilar node measures 11 mm. Mild subcarinal paratracheal adenopathy. No change from recent exam Lungs/Pleura: Innumerable bilateral pulmonary nodules of varying size scattered throughout the LEFT and RIGHT lung. No change from recent exam same day. No pulmonary infarction. Musculoskeletal: Multifocal sclerotic skeletal metastasis again noted. Review of the MIP images confirms the above findings. CT ABDOMEN and PELVIS FINDINGS Hepatobiliary: Large hepatic metastasis in the RIGHT hepatic lobe measuring 23 mm (image 9/3). Smaller metastatic lesion along the falciform ligament measuring 8 mm on image 17/3). Gallbladder distended. Pancreas: Pancreas is normal. No ductal dilatation. No  pancreatic inflammation. Spleen: Normal spleen Adrenals/urinary tract: Bilateral enlargement of the adrenal glands consistent metastatic disease. Findings similar to comparison CT No ureteral obstruction.  Bladder normal. Stomach/Bowel: Stomach, small bowel, appendix, and cecum are normal. The colon and rectosigmoid colon are normal. Vascular/Lymphatic: Abdominal aorta is normal caliber with atherosclerotic calcification. There is no retroperitoneal or periportal lymphadenopathy. No pelvic lymphadenopathy. Reproductive: Uterus and adnexa unremarkable. Uterus has a lobular contour consistent leiomyoma. Other: Is rounded nodule in the ventral LEFT peritoneal space (image 71/3 no change Musculoskeletal: Multifocal sclerotic skeletal metastasis no change Review of the MIP images confirms the above findings. IMPRESSION: Chest Impression: 1. No evidence acute pulmonary embolism. 2. Extensive bilateral multifocal pulmonary metastasis. 3. Enlargement of the pulmonary suggest pulmonary hypertension. 4. Mild metastatic mediastinal, supraclavicular and RIGHT axillary nodal metastasis Abdomen / Pelvis Impression: 1. hepatic metastasis stable 2. Bilateral adrenal metastasis.  No change 3. Small peritoneal nodule in the LEFT lower quadrant suggests peritoneal metastasis. 4. Widespread sclerotic skeletal metastasis unchanged. 5. No acute findings in the abdomen pelvis. Electronically Signed   By: SSuzy BouchardM.D.   On: 11/05/2022 18:15   CT CHEST W CONTRAST  Result Date: 11/05/2022 CLINICAL DATA:  History of stage IV breast cancer presenting with one-week history of shortness of breath and acute onset chest pain EXAM: CT CHEST WITH CONTRAST TECHNIQUE: Multidetector CT imaging of the chest was performed during intravenous contrast administration. RADIATION DOSE REDUCTION: This exam was performed according to the departmental dose-optimization program which includes automated exposure control, adjustment of the mA and/or kV  according to patient size and/or use of iterative reconstruction technique. CONTRAST:  763mOMNIPAQUE IOHEXOL 350 MG/ML SOLN COMPARISON:  CT chest dated 10/31/2021, CT chest dated 10/31/2021, PET-CT dated 10/05/2018 FINDINGS: Cardiovascular: Normal heart size. No significant pericardial fluid/thickening. Pulmonary artery measures 4.5 cm, previously 4.2 cm (remeasured). No central pulmonary emboli. Coronary artery calcifications and aortic atherosclerosis. Mediastinum/Nodes: Thyroidectomy. Small hiatal hernia. Multi station lymphadenopathy, for example left supraclavicular lymph node measures 14 mm (4:15), previously 6 mm, and right precarinal lymph node measures 12 mm (4:40), previously 8 mm. Left subpectoral node measures 9 mm. Lungs/Pleura: The central airways are patent. Innumerable bilateral pulmonary nodules, new and enlarged compared to 10/31/2021. No pneumothorax. No pleural effusion. Upper abdomen: Increased size of 2.7 cm segment 4 hepatic lesion (4:72), previously 2.4 cm and posterior segment 6 lesion measuring 1.6 cm (4:98), previously 0.9 cm. Bilateral adrenal nodules, unchanged, 1.7 cm on the right  and 1.6 cm on the left, likely adenomas. Musculoskeletal: New and enlarged extensive multifocal osseous metastases, for example 5 mm sclerotic focus in the left humeral head (4:7), new from 10/31/2021, and sclerotic lesion involving the right lateral aspect of T12 measuring 4.9 x 2.8 cm, previously 3.8 x 2.6 cm. Partially imaged breast demonstrates spiculated nodule in the left breast containing multiple metallic clips. Scattered subcutaneous soft tissue nodules along the posterior left thoracic region, for example measuring 7 mm (4:56), new compared to 10/31/2021. IMPRESSION: 1. No central pulmonary emboli. 2. Enlarged main pulmonary artery, can be seen in the setting of pulmonary hypertension. 3. Innumerable bilateral pulmonary nodules, new and enlarged compared to 10/31/2021, consistent with metastatic  disease. 4. New and enlarged extensive multifocal osseous metastases. 5. Increased size of hepatic metastases. 6. Increased multi station mediastinal and left supraclavicular lymphadenopathy. 7. Scattered subcutaneous soft tissue nodules along the posterior left thoracic region, new compared to 10/31/2021, suspicious for metastatic disease. 8.  Aortic Atherosclerosis (ICD10-I70.0). Electronically Signed   By: Darrin Nipper M.D.   On: 11/05/2022 17:07   DG Chest 2 View  Result Date: 11/05/2022 CLINICAL DATA:  Chest pain. Shortness of breath x1 week with chest pain x1 day. History of stage IV breast cancer. EXAM: CHEST - 2 VIEW COMPARISON:  Chest radiograph 11/14/2021.  Chest CT 10/31/2021. FINDINGS: Interval increase in diffuse nodular opacities throughout both lungs, may reflect worsening metastatic disease versus superimposed infection or edema. Stable cardiac and mediastinal contours. Possible small left pleural effusion. No pneumothorax. IMPRESSION: Interval increase in diffuse nodular opacities throughout both lungs, may reflect worsening metastatic disease versus superimposed infection or edema. Electronically Signed   By: Emmit Alexanders M.D.   On: 11/05/2022 11:59    Assessment/Plan Present on Admission:  Nausea and vomiting/hypokalemia/lactic acidosis -May be due to patient's malignancy or the new medication Verzinio .  Antiemetics ordered -Recommend discussing new medication and its side effects with oncology -No DKA, pH 7.4 -Replacing potassium IV.  BMP in a.m. -Follow lactic acid curve until normalized  Chest pain/shortness of breath -Most likely due to patient's worsening malignancy.  Now with new and innumerable pulmonary nodules.   Acute metabolic encephalopathy or slowing -Patient with recent CT head 10/11/2022 which was negative for metastasis.  Monitor for improvement with IV fluid hydration.  If confusion continues, can consider repeat CT head  Swollen bottom lip -Concerning for  angioedema.  Medications include Verzinio religiously since 10/16/2022 per oncology's note.  Daughter states patient bottom not typically swollen. -Will order p.o. benadryl and Pepcid.  No respiratory issues   Breast cancer of upper-outer quadrant of left female breast (HCC)// Multiple pulmonary nodules/liver/lung/bone/peritoneal mets/ FTT (failure to thrive) in adult -Place palliative consult   History of pulmonary embolus (PE), 12/2017 -Remains on Eliquis.  Continued   Hypertension -Resume Norvasc.  Metoprolol held   Hypothyroidism -Synthroid resumed  Tryton Bodi 11/05/2022, 8:18 PM

## 2022-11-05 NOTE — Progress Notes (Signed)
Restricted armband placed on LUE. 18g PIV removed from L AC.

## 2022-11-05 NOTE — ED Notes (Signed)
Pts IV blew in CT scan, pt has left arm restriction and 20G or higher is needed in [facility] or higher for scans. This RN attempted another AC IV but was unsuccessful. Consulting IV team

## 2022-11-05 NOTE — ED Notes (Signed)
Report given to Theresa RN

## 2022-11-05 NOTE — ED Triage Notes (Signed)
Pt BIB [facility] from home due to [facility] for the past week and now reporting chest pain the last hour.  Pt does have stage 4 breast cancer that was diagnosed a year ago; no treatment.  Pt was given '324mg'$  aspirin and 1 sublingual nitro en route.  No change in pain level.

## 2022-11-06 DIAGNOSIS — I2782 Chronic pulmonary embolism: Secondary | ICD-10-CM | POA: Diagnosis present

## 2022-11-06 DIAGNOSIS — E872 Acidosis, unspecified: Secondary | ICD-10-CM | POA: Diagnosis present

## 2022-11-06 DIAGNOSIS — C787 Secondary malignant neoplasm of liver and intrahepatic bile duct: Secondary | ICD-10-CM | POA: Diagnosis present

## 2022-11-06 DIAGNOSIS — R1114 Bilious vomiting: Secondary | ICD-10-CM | POA: Diagnosis not present

## 2022-11-06 DIAGNOSIS — C7800 Secondary malignant neoplasm of unspecified lung: Secondary | ICD-10-CM | POA: Diagnosis present

## 2022-11-06 DIAGNOSIS — Z86711 Personal history of pulmonary embolism: Secondary | ICD-10-CM | POA: Diagnosis not present

## 2022-11-06 DIAGNOSIS — C7951 Secondary malignant neoplasm of bone: Secondary | ICD-10-CM | POA: Diagnosis present

## 2022-11-06 DIAGNOSIS — E871 Hypo-osmolality and hyponatremia: Secondary | ICD-10-CM | POA: Diagnosis not present

## 2022-11-06 DIAGNOSIS — E049 Nontoxic goiter, unspecified: Secondary | ICD-10-CM | POA: Diagnosis present

## 2022-11-06 DIAGNOSIS — F411 Generalized anxiety disorder: Secondary | ICD-10-CM

## 2022-11-06 DIAGNOSIS — Z515 Encounter for palliative care: Secondary | ICD-10-CM | POA: Diagnosis not present

## 2022-11-06 DIAGNOSIS — E860 Dehydration: Secondary | ICD-10-CM | POA: Diagnosis present

## 2022-11-06 DIAGNOSIS — R627 Adult failure to thrive: Secondary | ICD-10-CM

## 2022-11-06 DIAGNOSIS — E785 Hyperlipidemia, unspecified: Secondary | ICD-10-CM | POA: Diagnosis present

## 2022-11-06 DIAGNOSIS — E039 Hypothyroidism, unspecified: Secondary | ICD-10-CM | POA: Diagnosis present

## 2022-11-06 DIAGNOSIS — C7931 Secondary malignant neoplasm of brain: Secondary | ICD-10-CM | POA: Diagnosis present

## 2022-11-06 DIAGNOSIS — R079 Chest pain, unspecified: Secondary | ICD-10-CM | POA: Diagnosis not present

## 2022-11-06 DIAGNOSIS — G9341 Metabolic encephalopathy: Secondary | ICD-10-CM | POA: Diagnosis present

## 2022-11-06 DIAGNOSIS — E876 Hypokalemia: Secondary | ICD-10-CM | POA: Diagnosis present

## 2022-11-06 DIAGNOSIS — C50412 Malignant neoplasm of upper-outer quadrant of left female breast: Secondary | ICD-10-CM | POA: Diagnosis present

## 2022-11-06 DIAGNOSIS — J181 Lobar pneumonia, unspecified organism: Secondary | ICD-10-CM | POA: Diagnosis not present

## 2022-11-06 DIAGNOSIS — Z170 Estrogen receptor positive status [ER+]: Secondary | ICD-10-CM | POA: Diagnosis not present

## 2022-11-06 DIAGNOSIS — Z7189 Other specified counseling: Secondary | ICD-10-CM

## 2022-11-06 DIAGNOSIS — C50911 Malignant neoplasm of unspecified site of right female breast: Secondary | ICD-10-CM | POA: Diagnosis not present

## 2022-11-06 DIAGNOSIS — J1282 Pneumonia due to coronavirus disease 2019: Secondary | ICD-10-CM | POA: Diagnosis not present

## 2022-11-06 DIAGNOSIS — I10 Essential (primary) hypertension: Secondary | ICD-10-CM | POA: Diagnosis present

## 2022-11-06 DIAGNOSIS — F05 Delirium due to known physiological condition: Secondary | ICD-10-CM | POA: Diagnosis not present

## 2022-11-06 DIAGNOSIS — R112 Nausea with vomiting, unspecified: Secondary | ICD-10-CM | POA: Diagnosis not present

## 2022-11-06 DIAGNOSIS — U071 COVID-19: Secondary | ICD-10-CM | POA: Diagnosis not present

## 2022-11-06 DIAGNOSIS — Z66 Do not resuscitate: Secondary | ICD-10-CM | POA: Diagnosis not present

## 2022-11-06 DIAGNOSIS — C786 Secondary malignant neoplasm of retroperitoneum and peritoneum: Secondary | ICD-10-CM | POA: Diagnosis present

## 2022-11-06 DIAGNOSIS — D61818 Other pancytopenia: Secondary | ICD-10-CM | POA: Diagnosis present

## 2022-11-06 DIAGNOSIS — E875 Hyperkalemia: Secondary | ICD-10-CM | POA: Diagnosis not present

## 2022-11-06 DIAGNOSIS — R531 Weakness: Secondary | ICD-10-CM | POA: Diagnosis not present

## 2022-11-06 LAB — CBC WITH DIFFERENTIAL/PLATELET
Abs Immature Granulocytes: 0 10*3/uL (ref 0.00–0.07)
Basophils Absolute: 0.1 10*3/uL (ref 0.0–0.1)
Basophils Relative: 3 %
Eosinophils Absolute: 0 10*3/uL (ref 0.0–0.5)
Eosinophils Relative: 1 %
HCT: 33.3 % — ABNORMAL LOW (ref 36.0–46.0)
Hemoglobin: 11.4 g/dL — ABNORMAL LOW (ref 12.0–15.0)
Lymphocytes Relative: 36 %
Lymphs Abs: 0.8 10*3/uL (ref 0.7–4.0)
MCH: 33.7 pg (ref 26.0–34.0)
MCHC: 34.2 g/dL (ref 30.0–36.0)
MCV: 98.5 fL (ref 80.0–100.0)
Monocytes Absolute: 0.1 10*3/uL (ref 0.1–1.0)
Monocytes Relative: 6 %
Neutro Abs: 1.1 10*3/uL — ABNORMAL LOW (ref 1.7–7.7)
Neutrophils Relative %: 54 %
Platelets: 143 10*3/uL — ABNORMAL LOW (ref 150–400)
RBC: 3.38 MIL/uL — ABNORMAL LOW (ref 3.87–5.11)
RDW: 16.3 % — ABNORMAL HIGH (ref 11.5–15.5)
WBC: 2.1 10*3/uL — ABNORMAL LOW (ref 4.0–10.5)
nRBC: 1 /100 WBC — ABNORMAL HIGH
nRBC: 1.4 % — ABNORMAL HIGH (ref 0.0–0.2)

## 2022-11-06 LAB — BASIC METABOLIC PANEL
Anion gap: 13 (ref 5–15)
BUN: 5 mg/dL — ABNORMAL LOW (ref 8–23)
CO2: 21 mmol/L — ABNORMAL LOW (ref 22–32)
Calcium: 8.1 mg/dL — ABNORMAL LOW (ref 8.9–10.3)
Chloride: 101 mmol/L (ref 98–111)
Creatinine, Ser: 0.87 mg/dL (ref 0.44–1.00)
GFR, Estimated: >60 mL/min (ref >60)
Glucose, Bld: 97 mg/dL (ref 70–99)
Potassium: 3.1 mmol/L — ABNORMAL LOW (ref 3.5–5.1)
Sodium: 135 mmol/L (ref 135–145)

## 2022-11-06 LAB — MAGNESIUM: Magnesium: 2.5 mg/dL — ABNORMAL HIGH (ref 1.7–2.4)

## 2022-11-06 MED ORDER — ONDANSETRON HCL 4 MG/2ML IJ SOLN
4.0000 mg | Freq: Three times a day (TID) | INTRAMUSCULAR | Status: DC
  Administered 2022-11-06 – 2022-11-07 (×3): 4 mg via INTRAVENOUS
  Filled 2022-11-06 (×3): qty 2

## 2022-11-06 MED ORDER — POLYETHYLENE GLYCOL 3350 17 G PO PACK
17.0000 g | PACK | Freq: Two times a day (BID) | ORAL | Status: DC
  Administered 2022-11-06 – 2022-11-23 (×20): 17 g via ORAL
  Filled 2022-11-06 (×28): qty 1

## 2022-11-06 MED ORDER — PANTOPRAZOLE SODIUM 40 MG PO TBEC
40.0000 mg | DELAYED_RELEASE_TABLET | Freq: Every day | ORAL | Status: DC
  Administered 2022-11-06 – 2022-11-23 (×18): 40 mg via ORAL
  Filled 2022-11-06 (×18): qty 1

## 2022-11-06 MED ORDER — ENSURE ENLIVE PO LIQD
237.0000 mL | Freq: Two times a day (BID) | ORAL | Status: DC
  Administered 2022-11-06 – 2022-11-23 (×23): 237 mL via ORAL
  Filled 2022-11-06 (×2): qty 237

## 2022-11-06 MED ORDER — IPRATROPIUM-ALBUTEROL 0.5-2.5 (3) MG/3ML IN SOLN
3.0000 mL | Freq: Four times a day (QID) | RESPIRATORY_TRACT | Status: DC
  Administered 2022-11-06: 3 mL via RESPIRATORY_TRACT
  Filled 2022-11-06: qty 3

## 2022-11-06 MED ORDER — ONDANSETRON HCL 4 MG PO TABS
4.0000 mg | ORAL_TABLET | Freq: Three times a day (TID) | ORAL | Status: DC
  Administered 2022-11-06: 4 mg via ORAL
  Filled 2022-11-06 (×3): qty 1

## 2022-11-06 MED ORDER — IPRATROPIUM-ALBUTEROL 0.5-2.5 (3) MG/3ML IN SOLN
3.0000 mL | Freq: Three times a day (TID) | RESPIRATORY_TRACT | Status: DC
  Administered 2022-11-07 – 2022-11-08 (×3): 3 mL via RESPIRATORY_TRACT
  Filled 2022-11-06 (×5): qty 3

## 2022-11-06 MED ORDER — LEVOTHYROXINE SODIUM 100 MCG PO TABS
100.0000 ug | ORAL_TABLET | Freq: Every day | ORAL | Status: DC
  Administered 2022-11-08 – 2022-11-23 (×16): 100 ug via ORAL
  Filled 2022-11-06 (×18): qty 1

## 2022-11-06 MED ORDER — AMLODIPINE BESYLATE 5 MG PO TABS: 5.0000 mg | ORAL_TABLET | Freq: Every day | ORAL | Status: DC

## 2022-11-06 MED ORDER — POTASSIUM CHLORIDE 10 MEQ/100ML IV SOLN: 10.0000 meq | INTRAVENOUS | Status: AC

## 2022-11-06 MED ORDER — FAMOTIDINE IN NACL 20-0.9 MG/50ML-% IV SOLN
20.0000 mg | Freq: Two times a day (BID) | INTRAVENOUS | Status: DC
  Administered 2022-11-06 – 2022-11-12 (×13): 20 mg via INTRAVENOUS
  Filled 2022-11-06 (×14): qty 50

## 2022-11-06 MED ORDER — POTASSIUM CHLORIDE 10 MEQ/100ML IV SOLN
INTRAVENOUS | Status: AC
  Administered 2022-11-06: 10 meq via INTRAVENOUS
  Filled 2022-11-06: qty 100

## 2022-11-06 MED ORDER — APIXABAN 5 MG PO TABS
5.0000 mg | ORAL_TABLET | Freq: Two times a day (BID) | ORAL | Status: DC
  Administered 2022-11-06 – 2022-11-23 (×34): 5 mg via ORAL
  Filled 2022-11-06 (×35): qty 1

## 2022-11-06 MED ORDER — DIPHENHYDRAMINE HCL 50 MG/ML IJ SOLN: 12.5000 mg | Freq: Once | INTRAMUSCULAR | Status: DC

## 2022-11-06 MED ORDER — METOPROLOL SUCCINATE ER 25 MG PO TB24
50.0000 mg | ORAL_TABLET | Freq: Every day | ORAL | Status: DC
  Administered 2022-11-07 – 2022-11-23 (×17): 50 mg via ORAL
  Filled 2022-11-06 (×17): qty 1

## 2022-11-06 MED ORDER — POTASSIUM CHLORIDE 10 MEQ/100ML IV SOLN
INTRAVENOUS | Status: AC
  Administered 2022-11-06: 10 meq
  Filled 2022-11-06: qty 100

## 2022-11-06 NOTE — ED Notes (Signed)
Pt daughter Sharrie Rothman) would like updates on mother contact number is [phone]

## 2022-11-06 NOTE — ED Notes (Signed)
Family([name], daughter) updated as to patient's status, and room assignment.

## 2022-11-06 NOTE — ED Notes (Signed)
Called lab to draw morning labs

## 2022-11-06 NOTE — ED Notes (Signed)
IV team present to assess iv and replace as needed since pt c/o pain and potassium is being run

## 2022-11-06 NOTE — ED Notes (Signed)
Lab present at bedside drawing morning labs

## 2022-11-06 NOTE — ED Notes (Signed)
Patient provided food and beverage along with warm blankets

## 2022-11-06 NOTE — Progress Notes (Signed)
Pt came to unit at 1845. No report called. Pt seems to be alert and oriented x4. Appers to have oxygen running. Appears to have IVF infusing.

## 2022-11-06 NOTE — ED Notes (Signed)
Per IV team RN IV remains patent and in vein ok to restart fluids

## 2022-11-06 NOTE — ED Notes (Signed)
ED TO INPATIENT HANDOFF REPORT  ED Nurse Name and Phone #: Su Grand [phone]  S Name/Age/Gender Sheila Jacobs 72 y.o. female Room/Bed: 038C/038C  Code Status   Code Status: Full Code  Home/SNF/Other Home Patient oriented to: self Is this baseline? Yes   Triage Complete: Triage complete  Chief Complaint Hyperkalemia [E87.5]  Triage Note Pt BIB [facility] from home due to [facility] for the past week and now reporting chest pain the last hour.  Pt does have stage 4 breast cancer that was diagnosed a year ago; no treatment.  Pt was given '324mg'$  aspirin and 1 sublingual nitro en route.  No change in pain level.     Allergies Allergies  Allergen Reactions   Cortisone Rash   Effexor [Venlafaxine] Palpitations   Lexapro [Escitalopram] Palpitations    Heart racing   Xarelto [Rivaroxaban] Rash    Level of Care/Admitting Diagnosis ED Disposition     ED Disposition  Admit   Condition  --   [place]: [place] [[id number]]  Level of Care: Telemetry Medical [104]  May place patient in observation at [facility] or [place] if equivalent level of care is available:: Yes  Covid Evaluation: Confirmed COVID Negative  Diagnosis: Hyperkalemia [id number]  Admitting Physician: [place], [place]  Attending Physician: Haywood Pao          B Medical/Surgery History Past Medical History:  Diagnosis Date   Acute saddle pulmonary embolism without acute cor pulmonale ([place]) 12/29/2017   Anginal pain ([place])    No angiographic evidence of CAD by cath 10/21/14   Arthritis    Breast cancer of upper-outer quadrant of left female breast ([place]) 01/31/2016   Colon polyps    GERD (gastroesophageal reflux disease)    Hearing deficit    Pt reports wearing hearing aides   Hearing loss    bilateral   History of radiation therapy 10/9-11/20/17   Left Breast 45 Gy in 25 fractions, Left Breast Boost 6 Gy in 3 fractions.    Hyperlipidemia    Hypertension     Morbid obesity (HCC)    Nausea and vomiting 11/05/2022   OSA (obstructive sleep apnea) 01/23/2018   uses cpap   Palpitations 10/16/2016   Personal history of noncompliance with medical treatment, presenting hazards to health 08/27/2016   Personal history of radiation therapy    Thyroid goiter    Past Surgical History:  Procedure Laterality Date   BREAST BIOPSY Left 01/2016   BREAST LUMPECTOMY Left 04/2016   BREAST LUMPECTOMY WITH RADIOACTIVE SEED AND SENTINEL LYMPH NODE BIOPSY Left 04/11/2016   Procedure: BREAST LUMPECTOMY WITH RADIOACTIVE SEED AND SENTINEL LYMPH NODE BIOPSY;  Surgeon: Excell Seltzer, MD;  Location: [facility];  Service: General;  Laterality: Left;   BRONCHIAL BIOPSY  11/14/2021   Procedure: BRONCHIAL BIOPSIES;  Surgeon: Garner Nash, DO;  Location: [facility];  Service: Pulmonary;;   BRONCHIAL BRUSHINGS  11/14/2021   Procedure: BRONCHIAL BRUSHINGS;  Surgeon: Garner Nash, DO;  Location: [facility];  Service: Pulmonary;;   BRONCHIAL NEEDLE ASPIRATION BIOPSY  11/14/2021   Procedure: BRONCHIAL NEEDLE ASPIRATION BIOPSIES;  Surgeon: Garner Nash, DO;  Location: [facility];  Service: Pulmonary;;   COLONOSCOPY WITH PROPOFOL N/A 10/20/2015   Procedure: COLONOSCOPY WITH PROPOFOL;  Surgeon: Mauri Pole, MD;  Location[facility];  Service: Endoscopy;  Laterality: N/A;   ESOPHAGOGASTRODUODENOSCOPY (EGD) WITH PROPOFOL N/A 10/20/2015   Procedure: ESOPHAGOGASTRODUODENOSCOPY (EGD) WITH PROPOFOL;  Surgeon: Mauri Pole, MD;  [facility];  Service: Endoscopy;  Laterality: N/A;   IR ANGIOGRAM PULMONARY BILATERAL SELECTIVE  12/29/2017   IR ANGIOGRAM SELECTIVE EACH ADDITIONAL VESSEL  12/29/2017   IR ANGIOGRAM SELECTIVE EACH ADDITIONAL VESSEL  12/29/2017   IR INFUSION THROMBOL ARTERIAL INITIAL (MS)  12/29/2017   IR INFUSION THROMBOL ARTERIAL INITIAL (MS)  12/29/2017   IR THROMB F/U EVAL ART/VEN FINAL DAY (MS)  12/30/2017   IR US GUIDE VASC ACCESS RIGHT  12/29/2017    LEFT HEART CATHETERIZATION WITH CORONARY ANGIOGRAM N/A 10/21/2014   Procedure: LEFT HEART CATHETERIZATION WITH CORONARY ANGIOGRAM;  Surgeon: Burnell , MD;  Location: [facility];  Service: Cardiovascular;  Laterality: N/A;   THYROIDECTOMY N/A 02/26/2017   Procedure: TOTAL THYROIDECTOMY;  Surgeon: Excell Seltzer, MD;  Location[facility];  Service: General;  Laterality: N/A;   TUBAL LIGATION     VIDEO BRONCHOSCOPY WITH RADIAL ENDOBRONCHIAL ULTRASOUND  11/14/2021   Procedure: RADIAL ENDOBRONCHIAL ULTRASOUND;  Surgeon: Garner Nash, DO;  Location: [facility];  Service: Pulmonary;;     A IV Location/Drains/Wounds Patient Lines/Drains/Airways Status     Active Line/Drains/Airways     Name Placement date Placement time Site Days   Peripheral IV 11/05/22 22 G 2.5" Anterior;Right Forearm 11/05/22  2230  Forearm  1            Intake/Output Last 24 hours  Intake/Output Summary (Last 24 hours) at 11/06/2022 0805 Last data filed at 11/06/2022 0045 Gross per 24 hour  Intake 1924.22 ml  Output --  Net 1924.22 ml    Labs/Imaging Results for orders placed or performed during the hospital encounter of 11/05/22 (from the past 48 hour(s))  Basic metabolic panel     Status: Abnormal   Collection Time: 11/05/22 11:15 AM  Result Value Ref Range   Sodium 139 135 - 145 mmol/L   Potassium 2.8 (L) 3.5 - 5.1 mmol/L   Chloride 101 98 - 111 mmol/L   CO2 21 (L) 22 - 32 mmol/L   Glucose, Bld 112 (H) 70 - 99 mg/dL    Comment: Glucose reference range applies only to samples taken after fasting for at least 8 hours.   BUN 6 (L) 8 - 23 mg/dL   Creatinine, Ser 1.07 (H) 0.44 - 1.00 mg/dL   Calcium 8.7 (L) 8.9 - 10.3 mg/dL   GFR, Estimated 55 (L) >60 mL/min    Comment: (NOTE) Calculated using the CKD-EPI Creatinine Equation (2021)    Anion gap 17 (H) 5 - 15    Comment: Performed at [facility] [address], [place], [place] [address]  CBC     Status: Abnormal   Collection  Time: 11/05/22 11:15 AM  Result Value Ref Range   WBC 2.8 (L) 4.0 - 10.5 K/uL   RBC 4.10 3.87 - 5.11 MIL/uL   Hemoglobin 13.8 12.0 - 15.0 g/dL   HCT 40.0 36.0 - 46.0 %   MCV 97.6 80.0 - 100.0 fL   MCH 33.7 26.0 - 34.0 pg   MCHC 34.5 30.0 - 36.0 g/dL   RDW 16.1 (H) 11.5 - 15.5 %   Platelets 162 150 - 400 K/uL   nRBC 1.1 (H) 0.0 - 0.2 %    Comment: Performed at [facility], [place] [address] [place], [place] [address]  Troponin I (High Sensitivity)     Status: Abnormal   Collection Time: 11/05/22 11:15 AM  Result Value Ref Range   Troponin I (High Sensitivity) 19 (H) <18  ng/L    Comment: (NOTE) Elevated high sensitivity troponin I (hsTnI) values and significant  changes across serial measurements may suggest ACS but many other  chronic and acute conditions are known to elevate hsTnI results.  Refer to the "Links" section for chest pain algorithms and additional  guidance. Performed at [facility], [place] [address] [place], [place] [address]   Magnesium     Status: None   Collection Time: 11/05/22 11:15 AM  Result Value Ref Range   Magnesium 2.4 1.7 - 2.4 mg/dL    Comment: Performed at [facility] [address], [place], [place] [address]  Hepatic function panel     Status: Abnormal   Collection Time: 11/05/22 11:15 AM  Result Value Ref Range   Total Protein 7.5 6.5 - 8.1 g/dL   Albumin 3.3 (L) 3.5 - 5.0 g/dL   AST 49 (H) 15 - 41 U/L   ALT 26 0 - 44 U/L   Alkaline Phosphatase 345 (H) 38 - 126 U/L   Total Bilirubin 1.5 (H) 0.3 - 1.2 mg/dL   Bilirubin, Direct 0.3 (H) 0.0 - 0.2 mg/dL   Indirect Bilirubin 1.2 (H) 0.3 - 0.9 mg/dL    Comment: Performed at [facility] [address] [place] [address], [place], [place] [address]  Lipase, blood     Status: Abnormal   Collection Time: 11/05/22 11:15 AM  Result Value Ref Range   Lipase 55 (H) 11 - 51 U/L    Comment: Performed at [facility], [place] [address], [place], [place] [address]  Brain natriuretic peptide      Status: None   Collection Time: 11/05/22 11:15 AM  Result Value Ref Range   B Natriuretic Peptide 45.1 0.0 - 100.0 pg/mL    Comment: Performed at [facility] [address], [place], [place] [address]  CBG monitoring, ED     Status: None   Collection Time: 11/05/22 11:40 AM  Result Value Ref Range   Glucose-Capillary 96 70 - 99 mg/dL    Comment: Glucose reference range applies only to samples taken after fasting for at least 8 hours.  Resp panel by RT-PCR (RSV, Flu A&B, Covid) Anterior Nasal Swab     Status: None   Collection Time: 11/05/22 12:08 PM   Specimen: Anterior Nasal Swab  Result Value Ref Range   SARS Coronavirus 2 by RT PCR NEGATIVE NEGATIVE   Influenza A by PCR NEGATIVE NEGATIVE   Influenza B by PCR NEGATIVE NEGATIVE    Comment: (NOTE) The Xpert Xpress SARS-CoV-2/FLU/RSV plus assay is intended as an aid in the diagnosis of influenza from Nasopharyngeal swab specimens and should not be used as a sole basis for treatment. Nasal washings and aspirates are unacceptable for Xpert Xpress SARS-CoV-2/FLU/RSV testing.  Fact Sheet for Patients: EntrepreneurPulse.com.au  Fact Sheet for Healthcare Providers: IncredibleEmployment.be  This test is not yet approved or cleared by the [place] FDA and has been authorized for detection and/or diagnosis of SARS-CoV-2 by FDA under an Emergency Use Authorization (EUA). This EUA will remain in effect (meaning this test can be used) for the duration of the COVID-19 declaration under Section 564(b)(1) of the Act, 21 U.S.C. section 360bbb-3(b)(1), unless the authorization is terminated or revoked.     Resp Syncytial Virus by PCR NEGATIVE NEGATIVE    Comment: (NOTE) Fact Sheet for Patients: EntrepreneurPulse.com.au  Fact Sheet for Healthcare Providers: IncredibleEmployment.be  This test is not yet approved or cleared by the [place]  States FDA and has  been authorized for detection and/or diagnosis of SARS-CoV-2 by FDA under an Emergency Use Authorization (EUA). This EUA will remain in effect (meaning this test can be used) for the duration of the COVID-19 declaration under Section 564(b)(1) of the Act, 21 U.S.C. section 360bbb-3(b)(1), unless the authorization is terminated or revoked.  Performed at [facility], [place] [address], [place], [place] [address]   Lactic acid, plasma     Status: Abnormal   Collection Time: 11/05/22  1:43 PM  Result Value Ref Range   Lactic Acid, Venous 3.3 (HH) 0.5 - 1.9 mmol/L    Comment: CRITICAL RESULT CALLED TO, READ BACK BY AND VERIFIED WITH C.SCHILLING RN 1504 11/05/22 MCCORMICK K Performed at [facility] [address] [place], [place] [address]   Troponin I (High Sensitivity)     Status: Abnormal   Collection Time: 11/05/22  1:47 PM  Result Value Ref Range   Troponin I (High Sensitivity) 20 (H) <18 ng/L    Comment: (NOTE) Elevated high sensitivity troponin I (hsTnI) values and significant  changes across serial measurements may suggest ACS but many other  chronic and acute conditions are known to elevate hsTnI results.  Refer to the "Links" section for chest pain algorithms and additional  guidance. Performed at [facility], [place] [address] [place], [place] [address]   Beta-hydroxybutyric acid     Status: Abnormal   Collection Time: 11/05/22  1:47 PM  Result Value Ref Range   Beta-Hydroxybutyric Acid 3.06 (H) 0.05 - 0.27 mmol/L    Comment: Performed at [facility] [address], [place], [place] [id number]  Salicylate level     Status: Abnormal   Collection Time: 11/05/22  1:47 PM  Result Value Ref Range   Salicylate Lvl Q000111Q (L) 7.0 - 30.0 mg/dL    Comment: Performed at [facility] [address] [place], [place] [address]  Acetaminophen level     Status: Abnormal   Collection Time: 11/05/22  1:47 PM  Result Value Ref Range   Acetaminophen  (Tylenol), Serum <10 (L) 10 - 30 ug/mL    Comment: (NOTE) Therapeutic concentrations vary significantly. A range of 10-30 ug/mL  may be an effective concentration for many patients. However, some  are best treated at concentrations outside of this range. Acetaminophen concentrations >150 ug/mL at 4 hours after ingestion  and >50 ug/mL at 12 hours after ingestion are often associated with  toxic reactions.  Performed at [facility], [place] [address], [place], [place] [address]   I-Stat venous blood gas, [facility], [facility])     Status: Abnormal   Collection Time: 11/05/22  1:55 PM  Result Value Ref Range   pH, Ven 7.482 (H) 7.25 - 7.43   pCO2, Ven 31.5 (L) 44 - 60 mmHg   pO2, Ven 44 32 - 45 mmHg   Bicarbonate 23.6 20.0 - 28.0 mmol/L   TCO2 25 22 - 32 mmol/L   O2 Saturation 83 %   Acid-Base Excess 1.0 0.0 - 2.0 mmol/L   Sodium 140 135 - 145 mmol/L   Potassium 2.7 (LL) 3.5 - 5.1 mmol/L   Calcium, Ion 1.01 (L) 1.15 - 1.40 mmol/L   HCT 37.0 36.0 - 46.0 %   Hemoglobin 12.6 12.0 - 15.0 g/dL   Sample type VENOUS    Comment NOTIFIED PHYSICIAN   Basic metabolic panel     Status: Abnormal   Collection  Time: 11/06/22  3:27 AM  Result Value Ref Range   Sodium 135 135 - 145 mmol/L   Potassium 3.1 (L) 3.5 - 5.1 mmol/L   Chloride 101 98 - 111 mmol/L   CO2 21 (L) 22 - 32 mmol/L   Glucose, Bld 97 70 - 99 mg/dL    Comment: Glucose reference range applies only to samples taken after fasting for at least 8 hours.   BUN 5 (L) 8 - 23 mg/dL   Creatinine, Ser 0.87 0.44 - 1.00 mg/dL   Calcium 8.1 (L) 8.9 - 10.3 mg/dL   GFR, Estimated >60 >60 mL/min    Comment: (NOTE) Calculated using the CKD-EPI Creatinine Equation (2021)    Anion gap 13 5 - 15    Comment: Performed at [facility] [address], [place], [place] [address]  Magnesium     Status: Abnormal   Collection Time: 11/06/22  3:27 AM  Result Value Ref Range   Magnesium 2.5 (H) 1.7 - 2.4 mg/dL    Comment: Performed at [facility] [address] [place], [place] [address]  CBC with Differential/Platelet     Status: Abnormal   Collection Time: 11/06/22  3:27 AM  Result Value Ref Range   WBC 2.1 (L) 4.0 - 10.5 K/uL   RBC 3.38 (L) 3.87 - 5.11 MIL/uL   Hemoglobin 11.4 (L) 12.0 - 15.0 g/dL   HCT 33.3 (L) 36.0 - 46.0 %   MCV 98.5 80.0 - 100.0 fL   MCH 33.7 26.0 - 34.0 pg   MCHC 34.2 30.0 - 36.0 g/dL   RDW 16.3 (H) 11.5 - 15.5 %   Platelets 143 (L) 150 - 400 K/uL   nRBC 1.4 (H) 0.0 - 0.2 %   Neutrophils Relative % 54 %   Neutro Abs 1.1 (L) 1.7 - 7.7 K/uL   Lymphocytes Relative 36 %   Lymphs Abs 0.8 0.7 - 4.0 K/uL   Monocytes Relative 6 %   Monocytes Absolute 0.1 0.1 - 1.0 K/uL   Eosinophils Relative 1 %   Eosinophils Absolute 0.0 0.0 - 0.5 K/uL   Basophils Relative 3 %   Basophils Absolute 0.1 0.0 - 0.1 K/uL   nRBC 1 (H) 0 /100 WBC   Abs Immature Granulocytes 0.00 0.00 - 0.07 K/uL    Comment: Performed at [facility], [place] [address], [place], [place] [address]   CT ABDOMEN PELVIS W CONTRAST  Result Date: 11/05/2022 CLINICAL DATA:  Abdominal pain. Acute abdominal pain nonlocalized. Stage IV breast cancer with lung metastasis. * Tracking Code: BO *. EXAM: CT ANGIOGRAPHY CHEST CT ABDOMEN AND PELVIS WITH CONTRAST TECHNIQUE: Multidetector CT imaging of the chest was performed using the standard protocol during bolus administration of intravenous contrast. Multiplanar CT image reconstructions and MIPs were obtained to evaluate the vascular anatomy. Multidetector CT imaging of the abdomen and pelvis was performed using the standard protocol during bolus administration of intravenous contrast. RADIATION DOSE REDUCTION: This exam was performed according to the departmental dose-optimization program which includes automated exposure control, adjustment of the mA and/or kV according to patient size and/or use of iterative reconstruction technique. CONTRAST:  85m OMNIPAQUE IOHEXOL 350 MG/ML SOLN COMPARISON:   CT chest 11/05/2022, CT abdomen pelvis 10/11/2022 FINDINGS: CTA CHEST FINDINGS Cardiovascular: No filling defects within the pulmonary arteries to suggest acute pulmonary embolism. Pulmonary artery is dilated 4.6 cm. Mediastinum/Nodes: Enlarged supraclavicular nodes. RIGHT hilar mild adenopathy. RIGHT hilar node measures 11 mm. Mild subcarinal paratracheal  adenopathy. No change from recent exam Lungs/Pleura: Innumerable bilateral pulmonary nodules of varying size scattered throughout the LEFT and RIGHT lung. No change from recent exam same day. No pulmonary infarction. Musculoskeletal: Multifocal sclerotic skeletal metastasis again noted. Review of the MIP images confirms the above findings. CT ABDOMEN and PELVIS FINDINGS Hepatobiliary: Large hepatic metastasis in the RIGHT hepatic lobe measuring 23 mm (image 9/3). Smaller metastatic lesion along the falciform ligament measuring 8 mm on image 17/3). Gallbladder distended. Pancreas: Pancreas is normal. No ductal dilatation. No pancreatic inflammation. Spleen: Normal spleen Adrenals/urinary tract: Bilateral enlargement of the adrenal glands consistent metastatic disease. Findings similar to comparison CT No ureteral obstruction.  Bladder normal. Stomach/Bowel: Stomach, small bowel, appendix, and cecum are normal. The colon and rectosigmoid colon are normal. Vascular/Lymphatic: Abdominal aorta is normal caliber with atherosclerotic calcification. There is no retroperitoneal or periportal lymphadenopathy. No pelvic lymphadenopathy. Reproductive: Uterus and adnexa unremarkable. Uterus has a lobular contour consistent leiomyoma. Other: Is rounded nodule in the ventral LEFT peritoneal space (image 71/3 no change Musculoskeletal: Multifocal sclerotic skeletal metastasis no change Review of the MIP images confirms the above findings. IMPRESSION: Chest Impression: 1. No evidence acute pulmonary embolism. 2. Extensive bilateral multifocal pulmonary metastasis. 3. Enlargement  of the pulmonary suggest pulmonary hypertension. 4. Mild metastatic mediastinal, supraclavicular and RIGHT axillary nodal metastasis Abdomen / Pelvis Impression: 1. hepatic metastasis stable 2. Bilateral adrenal metastasis.  No change 3. Small peritoneal nodule in the LEFT lower quadrant suggests peritoneal metastasis. 4. Widespread sclerotic skeletal metastasis unchanged. 5. No acute findings in the abdomen pelvis. Electronically Signed   By: Suzy Bouchard M.D.   On: 11/05/2022 18:15   CT Angio Chest Pulmonary Embolism (PE) W or WO Contrast  Result Date: 11/05/2022 CLINICAL DATA:  Abdominal pain. Acute abdominal pain nonlocalized. Stage IV breast cancer with lung metastasis. * Tracking Code: BO *. EXAM: CT ANGIOGRAPHY CHEST CT ABDOMEN AND PELVIS WITH CONTRAST TECHNIQUE: Multidetector CT imaging of the chest was performed using the standard protocol during bolus administration of intravenous contrast. Multiplanar CT image reconstructions and MIPs were obtained to evaluate the vascular anatomy. Multidetector CT imaging of the abdomen and pelvis was performed using the standard protocol during bolus administration of intravenous contrast. RADIATION DOSE REDUCTION: This exam was performed according to the departmental dose-optimization program which includes automated exposure control, adjustment of the mA and/or kV according to patient size and/or use of iterative reconstruction technique. CONTRAST:  88m OMNIPAQUE IOHEXOL 350 MG/ML SOLN COMPARISON:  CT chest 11/05/2022, CT abdomen pelvis 10/11/2022 FINDINGS: CTA CHEST FINDINGS Cardiovascular: No filling defects within the pulmonary arteries to suggest acute pulmonary embolism. Pulmonary artery is dilated 4.6 cm. Mediastinum/Nodes: Enlarged supraclavicular nodes. RIGHT hilar mild adenopathy. RIGHT hilar node measures 11 mm. Mild subcarinal paratracheal adenopathy. No change from recent exam Lungs/Pleura: Innumerable bilateral pulmonary nodules of varying size  scattered throughout the LEFT and RIGHT lung. No change from recent exam same day. No pulmonary infarction. Musculoskeletal: Multifocal sclerotic skeletal metastasis again noted. Review of the MIP images confirms the above findings. CT ABDOMEN and PELVIS FINDINGS Hepatobiliary: Large hepatic metastasis in the RIGHT hepatic lobe measuring 23 mm (image 9/3). Smaller metastatic lesion along the falciform ligament measuring 8 mm on image 17/3). Gallbladder distended. Pancreas: Pancreas is normal. No ductal dilatation. No pancreatic inflammation. Spleen: Normal spleen Adrenals/urinary tract: Bilateral enlargement of the adrenal glands consistent metastatic disease. Findings similar to comparison CT No ureteral obstruction.  Bladder normal. Stomach/Bowel: Stomach, small bowel, appendix, and cecum are normal. The colon  and rectosigmoid colon are normal. Vascular/Lymphatic: Abdominal aorta is normal caliber with atherosclerotic calcification. There is no retroperitoneal or periportal lymphadenopathy. No pelvic lymphadenopathy. Reproductive: Uterus and adnexa unremarkable. Uterus has a lobular contour consistent leiomyoma. Other: Is rounded nodule in the ventral LEFT peritoneal space (image 71/3 no change Musculoskeletal: Multifocal sclerotic skeletal metastasis no change Review of the MIP images confirms the above findings. IMPRESSION: Chest Impression: 1. No evidence acute pulmonary embolism. 2. Extensive bilateral multifocal pulmonary metastasis. 3. Enlargement of the pulmonary suggest pulmonary hypertension. 4. Mild metastatic mediastinal, supraclavicular and RIGHT axillary nodal metastasis Abdomen / Pelvis Impression: 1. hepatic metastasis stable 2. Bilateral adrenal metastasis.  No change 3. Small peritoneal nodule in the LEFT lower quadrant suggests peritoneal metastasis. 4. Widespread sclerotic skeletal metastasis unchanged. 5. No acute findings in the abdomen pelvis. Electronically Signed   By: Suzy Bouchard M.D.    On: 11/05/2022 18:15   CT CHEST W CONTRAST  Result Date: 11/05/2022 CLINICAL DATA:  History of stage IV breast cancer presenting with one-week history of shortness of breath and acute onset chest pain EXAM: CT CHEST WITH CONTRAST TECHNIQUE: Multidetector CT imaging of the chest was performed during intravenous contrast administration. RADIATION DOSE REDUCTION: This exam was performed according to the departmental dose-optimization program which includes automated exposure control, adjustment of the mA and/or kV according to patient size and/or use of iterative reconstruction technique. CONTRAST:  36m OMNIPAQUE IOHEXOL 350 MG/ML SOLN COMPARISON:  CT chest dated 10/31/2021, CT chest dated 10/31/2021, PET-CT dated 10/05/2018 FINDINGS: Cardiovascular: Normal heart size. No significant pericardial fluid/thickening. Pulmonary artery measures 4.5 cm, previously 4.2 cm (remeasured). No central pulmonary emboli. Coronary artery calcifications and aortic atherosclerosis. Mediastinum/Nodes: Thyroidectomy. Small hiatal hernia. Multi station lymphadenopathy, for example left supraclavicular lymph node measures 14 mm (4:15), previously 6 mm, and right precarinal lymph node measures 12 mm (4:40), previously 8 mm. Left subpectoral node measures 9 mm. Lungs/Pleura: The central airways are patent. Innumerable bilateral pulmonary nodules, new and enlarged compared to 10/31/2021. No pneumothorax. No pleural effusion. Upper abdomen: Increased size of 2.7 cm segment 4 hepatic lesion (4:72), previously 2.4 cm and posterior segment 6 lesion measuring 1.6 cm (4:98), previously 0.9 cm. Bilateral adrenal nodules, unchanged, 1.7 cm on the right and 1.6 cm on the left, likely adenomas. Musculoskeletal: New and enlarged extensive multifocal osseous metastases, for example 5 mm sclerotic focus in the left humeral head (4:7), new from 10/31/2021, and sclerotic lesion involving the right lateral aspect of T12 measuring 4.9 x 2.8 cm,  previously 3.8 x 2.6 cm. Partially imaged breast demonstrates spiculated nodule in the left breast containing multiple metallic clips. Scattered subcutaneous soft tissue nodules along the posterior left thoracic region, for example measuring 7 mm (4:56), new compared to 10/31/2021. IMPRESSION: 1. No central pulmonary emboli. 2. Enlarged main pulmonary artery, can be seen in the setting of pulmonary hypertension. 3. Innumerable bilateral pulmonary nodules, new and enlarged compared to 10/31/2021, consistent with metastatic disease. 4. New and enlarged extensive multifocal osseous metastases. 5. Increased size of hepatic metastases. 6. Increased multi station mediastinal and left supraclavicular lymphadenopathy. 7. Scattered subcutaneous soft tissue nodules along the posterior left thoracic region, new compared to 10/31/2021, suspicious for metastatic disease. 8.  Aortic Atherosclerosis (ICD10-I70.0). Electronically Signed   By: LDarrin NipperM.D.   On: 11/05/2022 17:07   DG Chest 2 View  Result Date: 11/05/2022 CLINICAL DATA:  Chest pain. Shortness of breath x1 week with chest pain x1 day. History of stage IV breast cancer. EXAM:  CHEST - 2 VIEW COMPARISON:  Chest radiograph 11/14/2021.  Chest CT 10/31/2021. FINDINGS: Interval increase in diffuse nodular opacities throughout both lungs, may reflect worsening metastatic disease versus superimposed infection or edema. Stable cardiac and mediastinal contours. Possible small left pleural effusion. No pneumothorax. IMPRESSION: Interval increase in diffuse nodular opacities throughout both lungs, may reflect worsening metastatic disease versus superimposed infection or edema. Electronically Signed   By: Emmit Alexanders M.D.   On: 11/05/2022 11:59    Pending Labs Unresulted Labs (From admission, onward)    None       Vitals/Pain Today's Vitals   11/06/22 0200 11/06/22 0351 11/06/22 0400 11/06/22 0600  BP:  106/64 110/60   Pulse: 81 86 84 79  Resp:  '20 14 19   '$ Temp:  (!) 97.4 F (36.3 C)    TempSrc:  Oral    SpO2: 97% 96% 94% 100%  Weight:      Height:      PainSc:        Isolation Precautions No active isolations  Medications Medications  potassium chloride 10 mEq in 100 mL IVPB (10 mEq Intravenous Not Given 11/05/22 2103)  0.9 % NaCl with KCl 20 mEq/ L  infusion ( Intravenous New Bag/Given 11/05/22 2324)  acetaminophen (TYLENOL) tablet 650 mg (has no administration in time range)    Or  acetaminophen (TYLENOL) suppository 650 mg (has no administration in time range)  senna-docusate (Senokot-S) tablet 1 tablet (has no administration in time range)  ondansetron (ZOFRAN) tablet 4 mg (has no administration in time range)    Or  ondansetron (ZOFRAN) injection 4 mg (has no administration in time range)  famotidine (PEPCID) IVPB 20 mg premix (has no administration in time range)  diphenhydrAMINE (BENADRYL) injection 12.5 mg (has no administration in time range)  potassium chloride 10 mEq in 100 mL IVPB (has no administration in time range)  potassium chloride SA (KLOR-CON M) CR tablet 40 mEq (40 mEq Oral Given 11/05/22 1330)  metoCLOPramide (REGLAN) injection 10 mg (10 mg Intravenous Given 11/05/22 1634)  lactated ringers bolus 500 mL (0 mLs Intravenous Stopped 11/05/22 2108)  lactated ringers bolus 1,000 mL (0 mLs Intravenous Stopped 11/05/22 2108)  iohexol (OMNIPAQUE) 350 MG/ML injection 75 mL (75 mLs Intravenous Contrast Given 11/05/22 1644)  iohexol (OMNIPAQUE) 350 MG/ML injection 80 mL (80 mLs Intravenous Contrast Given 11/05/22 1726)  magnesium sulfate IVPB 1 g 100 mL (0 g Intravenous Stopped 11/06/22 0045)    Mobility walks     Focused Assessments Pulmonary Assessment Handoff:  Lung sounds:   O2 Device: Room Air      R Recommendations: See Admitting Provider Note  Report given to:   Additional Notes:

## 2022-11-06 NOTE — Progress Notes (Signed)
I discussed with patient's daughter Sheila Jacobs about the results of the scans that she had in the emergency room. Majority of the changes that were noted, the lung nodules or liver mets or the bone mets were noted previously and the family was aware of them.  It appears that apart from a slight increase in the bone mets, the rest of the metastatic disease is stable.  The peritoneal nodule is however a new finding   Plan: Once she gets discharged in the hospital she will continue the treatment and perform additional scans on March 20th to have a better assessment of her disease. She would benefit from palliative care consultation while she is in the hospital.

## 2022-11-06 NOTE — Consult Note (Signed)
Consultation Note Date: 11/06/2022   Patient Name: Sheila Jacobs  DOB: Jan 04, 1951  MRN: [id number]  Age / Sex: 72 y.o., female  PCP: Arthur Holms, NP Referring Physician: Elmarie Shiley, MD  Reason for Consultation: Establishing goals of care  HPI/Patient Profile: 72 y.o. female  with past medical history of HTN, HLD, GERD, goiter, OSA, prior PE, arthritis, and metastatic cancer admitted on 11/05/2022 with chest pain, nausea and vomiting.   Patient is admitted for lactic acidosis, hypokalemia, acute metabolic encephalopathy, possible angioedema of bottom life, failure to thrive. PMT has been consulted to assist with goals of care conversation.  Clinical Assessment and Goals of Care:  I have reviewed medical records including EPIC notes, labs and imaging, assessed the patient and then had a phone conversation with patient's daughter Sharrie Rothman to discuss diagnosis prognosis, [place], EOL wishes, disposition and options.  I introduced Palliative Medicine as specialized medical care for people living with serious illness. It focuses on providing relief from the symptoms and stress of a serious illness. The goal is to improve quality of life for both the patient and the family.  We discussed a brief life review of the patient and then focused on their current illness.   I attempted to elicit values and goals of care important to the patient.    Medical History Review and Understanding:  Patient tells me that at the time of her diagnosis with breast cancer (last year) she was told her prognosis was about two years. Patient's daughter shares that oncology has broached the topics of palliative care and hospice, as there are no other treatment options if she cannot tolerate the [place].  Social History: Patient lives with her daughter and granddaughter.   Functional and Nutritional State: Patient ambulates with  assistance, although she becomes dyspneic after 1-2 steps. She has only eaten applesauce and pudding or drank sweet tea or water over the past month or two.  Palliative Symptoms: Patient denies current symptoms  Discussion: I met with the patient at the bedside and communicated with her through writing, as her hearing aids were not working well. She is initially hesitant about PMT involvement, asking "I'm not sick enough for that, am I?" I provided reassurance that patients with serious illness often benefit from an extra layer of support, from day one of their diagnosis. I provided her with an update on imaging that showed a new peritoneal lymph node concerning for a new metastasis. She shook her head in disbelief and did not have much to say in response, though she did ask if physical therapy could be beneficial. I shared that unfortunately, physical therapy would not affect disease progression. I offered to arrange a family meeting to discuss how this could impact her life moving forward and other goals or anticipatory care needs. She shrugs ambivalently. She is agreeable to this PA calling her daughter as well. I spoke to her daughter Sharrie Rothman by phone and provider her with an update on the above conversation. We discussed patient's current care plan as well, reviewing the connection between patient's electrolyte imbalance and her poor oral intake. I shared my concern that patient seems very weak and provided education on failure to thrive syndrome. Sharrie Rothman has had more difficulty with helping her mother at home lately as her weakness has progressed. She is surprised that patient was open to conversation with PMT, as she has been very opposed to hospice. Sharrie Rothman is open to discussing a family meeting with her mother when she visits and  will call PMT with a preferred date/time if she feels this would be beneficial.    Discussed the importance of continued conversation with family and the medical  providers regarding overall plan of care and treatment options, ensuring decisions are within the context of the patient's values and GOCs.   Questions and concerns were addressed.  The family was encouraged to call with questions or concerns.  PMT will continue to support holistically.   SUMMARY OF RECOMMENDATIONS   -Continue full code/full scope treatment -Patient and her daughter will discuss whether they are interested in a family meeting to discuss goals of care -Patient is not ready for hospice and open to palliative care support -PMT will continue to follow and support  Prognosis:  Unable to determine  Discharge Planning: To Be Determined      Primary Diagnoses: Present on Admission:  Nausea and vomiting  Hypokalemia  FTT (failure to thrive) in adult  Breast cancer of upper-outer quadrant of left female breast ([place])  Generalized anxiety disorder  History of pulmonary embolus (PE), 12/2017  Hypertension  Hypothyroidism  Multiple pulmonary nodules  Hyperkalemia   Physical Exam Vitals reviewed.  Constitutional:      Appearance: She is ill-appearing.     Comments: HOH, hearing aids not working  Cardiovascular:     Rate and Rhythm: Normal rate.  Pulmonary:     Effort: Pulmonary effort is normal.  Skin:    General: Skin is warm and dry.  Neurological:     Mental Status: Mental status is at baseline.  Psychiatric:        Mood and Affect: Mood normal.        Behavior: Behavior normal.    Vital Signs: BP 112/63   Pulse 93   Temp 97.7 F (36.5 C) (Oral)   Resp (!) 22   Ht '5\' 3"'$  (1.6 m)   Wt 100.7 kg   SpO2 96%   BMI 39.33 kg/m  Pain Scale: 0-10   Pain Score: 0-No pain   SpO2: SpO2: 96 % O2 Device:SpO2: 96 % O2 Flow Rate: .    Palliative Assessment/Data: 50%     MDM: high   Murle Otting Johnnette Litter, PA-C  Palliative Medicine Team Team phone # [phone]  Thank you for allowing the Palliative Medicine Team to assist in the care of this  patient. Please utilize secure chat with additional questions, if there is no response within 30 minutes please call the above phone number.  Palliative Medicine Team providers are available by phone from 7am to 7pm daily and can be reached through the team cell phone.  Should this patient require assistance outside of these hours, please call the patient's attending physician.

## 2022-11-06 NOTE — Progress Notes (Addendum)
PROGRESS NOTE    Sheila Jacobs  [id number] DOB: [birth year] DOA: 11/05/2022 PCP: Arthur Holms, NP   Brief Narrative: 72 year old with past medical history significant for hypertension, hyperlipidemia, GERD, goiter, OSA, prior PE, arthritis, metastatic breast cancer.  Sent to the ED for evaluation of chest pain, nausea and vomiting. Patient is not a good historian.   Assessment & Plan:   Principal Problem:   Nausea and vomiting Active Problems:   Hypertension   Breast cancer of upper-outer quadrant of left female breast (HCC)   Generalized anxiety disorder   Hypothyroidism   History of pulmonary embolus (PE), 12/2017   Chronic anticoagulation   Multiple pulmonary nodules   Hypokalemia   FTT (failure to thrive) in adult   Hyperkalemia   1-Nausea vomiting: Metabolic acidosis, hypokalemia: Continue with IV fluids.  Will schedule Zofran TID Could be related to Verzenio, ok to hold per Dr Lindi Adie   Chest pain: Suspect related to metastasis diseases.  CTA negative for PE>   Acute Metabolic encephalopathy: Could be related to dehydration. Will get MRI brain with contrast rule out brain mets.   Swelling bottom Lip;  Per Daughter patient lower lip has been always big.  Monitor.   Breast cancer upper outer quadrant left, multiple pulmonary nodules, liver lung bone peritoneal mets CT chest multiple lung mets. Liver mets, bone mets.  Palliative care consulted.  Dr Lindi Adie informed of patient admission.   History of PE: Resume Eliquis.   HTN;  On metoprolol.  Hold Norvasc, BP soft.   Hypothyroidism: Continue with Synthroid.   Leukopenia/Pancytopenia  In setting malignancy. Monitor.   Hypokalemia; replete IV and with IV fluids.     Estimated body mass index is 39.33 kg/m as calculated from the following:   Height as of this encounter: '5\' 3"'$  (1.6 m).   Weight as of this encounter: 100.7 kg.   DVT prophylaxis: Eliquis Code Status: Full code Family  Communication: Daughter at bedside.  Disposition Plan:  Status is: Observation The patient remains OBS appropriate and will d/c before 2 midnights.    Consultants:  Dr Lindi Adie  Procedures:    Antimicrobials:    Subjective: She denies chest pain today.  She has  been vomiting on and off per daughter.  Patient denies abdominal pain.  She is hard of hearing.   Objective: Vitals:   11/06/22 0200 11/06/22 0351 11/06/22 0400 11/06/22 0600  BP:  106/64 110/60   Pulse: 81 86 84 79  Resp:  '20 14 19  '$ Temp:  (!) 97.4 F (36.3 C)    TempSrc:  Oral    SpO2: 97% 96% 94% 100%  Weight:      Height:        Intake/Output Summary (Last 24 hours) at 11/06/2022 0747 Last data filed at 11/06/2022 0045 Gross per 24 hour  Intake 1924.22 ml  Output --  Net 1924.22 ml   Filed Weights   11/05/22 1100  Weight: 100.7 kg    Examination:  General exam: Appears calm and comfortable  Respiratory system: Clear to auscultation. Respiratory effort normal. Cardiovascular system: S1 & S2 heard, RRR. Gastrointestinal system: Abdomen is nondistended, soft and nontender. No organomegaly or masses felt. Normal bowel sounds heard. Central nervous system: Alert and oriented.  Extremities: Symmetric 5 x 5 power.   Data Reviewed: I have personally reviewed following labs and imaging studies  CBC: Recent Labs  Lab 10/31/22 1122 11/05/22 1115 11/05/22 1355 11/06/22 0327  WBC 4.2 2.8*  --  2.1*  NEUTROABS 1.9  --   --  1.1*  HGB 12.6 13.8 12.6 11.4*  HCT 37.1 40.0 37.0 33.3*  MCV 94.9 97.6  --  98.5  PLT 168 162  --  [id number]*   Basic Metabolic Panel: Recent Labs  Lab 10/31/22 1122 11/05/22 1115 11/05/22 1355 11/06/22 0327  NA 138 139 140 135  K 3.0* 2.8* 2.7* 3.1*  CL 100 101  --  101  CO2 25 21*  --  21*  GLUCOSE 124* 112*  --  97  BUN 13 6*  --  5*  CREATININE 1.09* 1.07*  --  0.87  CALCIUM 10.1 8.7*  --  8.1*  MG  --  2.4  --  2.5*   GFR: Estimated Creatinine Clearance: 65.2  mL/min (by C-G formula based on SCr of 0.87 mg/dL). Liver Function Tests: Recent Labs  Lab 10/31/22 1122 11/05/22 1115  AST 33 49*  ALT 19 26  ALKPHOS 428* 345*  BILITOT 1.0 1.5*  PROT 7.2 7.5  ALBUMIN 3.5 3.3*   Recent Labs  Lab 11/05/22 1115  LIPASE 55*   No results for input(s): "AMMONIA" in the last 168 hours. Coagulation Profile: No results for input(s): "INR", "PROTIME" in the last 168 hours. Cardiac Enzymes: No results for input(s): "CKTOTAL", "CKMB", "CKMBINDEX", "TROPONINI" in the last 168 hours. BNP (last 3 results) No results for input(s): "PROBNP" in the last 8760 hours. HbA1C: No results for input(s): "HGBA1C" in the last 72 hours. CBG: Recent Labs  Lab 11/05/22 1140  GLUCAP 96   Lipid Profile: No results for input(s): "CHOL", "HDL", "LDLCALC", "TRIG", "CHOLHDL", "LDLDIRECT" in the last 72 hours. Thyroid Function Tests: No results for input(s): "TSH", "T4TOTAL", "FREET4", "T3FREE", "THYROIDAB" in the last 72 hours. Anemia Panel: No results for input(s): "VITAMINB12", "FOLATE", "FERRITIN", "TIBC", "IRON", "RETICCTPCT" in the last 72 hours. Sepsis Labs: Recent Labs  Lab 11/05/22 1343  LATICACIDVEN 3.3*    Recent Results (from the past 240 hour(s))  Resp panel by RT-PCR (RSV, Flu A&B, Covid) Anterior Nasal Swab     Status: None   Collection Time: 11/05/22 12:08 PM   Specimen: Anterior Nasal Swab  Result Value Ref Range Status   SARS Coronavirus 2 by RT PCR NEGATIVE NEGATIVE Final   Influenza A by PCR NEGATIVE NEGATIVE Final   Influenza B by PCR NEGATIVE NEGATIVE Final    Comment: (NOTE) The Xpert Xpress SARS-CoV-2/FLU/RSV plus assay is intended as an aid in the diagnosis of influenza from Nasopharyngeal swab specimens and should not be used as a sole basis for treatment. Nasal washings and aspirates are unacceptable for Xpert Xpress SARS-CoV-2/FLU/RSV testing.  Fact Sheet for Patients: EntrepreneurPulse.com.au  Fact Sheet for  Healthcare Providers: IncredibleEmployment.be  This test is not yet approved or cleared by the [place] FDA and has been authorized for detection and/or diagnosis of SARS-CoV-2 by FDA under an Emergency Use Authorization (EUA). This EUA will remain in effect (meaning this test can be used) for the duration of the COVID-19 declaration under Section 564(b)(1) of the Act, 21 U.S.C. section 360bbb-3(b)(1), unless the authorization is terminated or revoked.     Resp Syncytial Virus by PCR NEGATIVE NEGATIVE Final    Comment: (NOTE) Fact Sheet for Patients: EntrepreneurPulse.com.au  Fact Sheet for Healthcare Providers: IncredibleEmployment.be  This test is not yet approved or cleared by the [place] FDA and has been authorized for detection and/or diagnosis of SARS-CoV-2 by FDA under an Emergency Use Authorization (EUA). This EUA will remain in effect (meaning  this test can be used) for the duration of the COVID-19 declaration under Section 564(b)(1) of the Act, 21 U.S.C. section 360bbb-3(b)(1), unless the authorization is terminated or revoked.  Performed at [facility], [place] [address], [place], [place] [address]          Radiology Studies: CT ABDOMEN PELVIS W CONTRAST  Result Date: 11/05/2022 CLINICAL DATA:  Abdominal pain. Acute abdominal pain nonlocalized. Stage IV breast cancer with lung metastasis. * Tracking Code: BO *. EXAM: CT ANGIOGRAPHY CHEST CT ABDOMEN AND PELVIS WITH CONTRAST TECHNIQUE: Multidetector CT imaging of the chest was performed using the standard protocol during bolus administration of intravenous contrast. Multiplanar CT image reconstructions and MIPs were obtained to evaluate the vascular anatomy. Multidetector CT imaging of the abdomen and pelvis was performed using the standard protocol during bolus administration of intravenous contrast. RADIATION DOSE REDUCTION: This exam was performed  according to the departmental dose-optimization program which includes automated exposure control, adjustment of the mA and/or kV according to patient size and/or use of iterative reconstruction technique. CONTRAST:  76m OMNIPAQUE IOHEXOL 350 MG/ML SOLN COMPARISON:  CT chest 11/05/2022, CT abdomen pelvis 10/11/2022 FINDINGS: CTA CHEST FINDINGS Cardiovascular: No filling defects within the pulmonary arteries to suggest acute pulmonary embolism. Pulmonary artery is dilated 4.6 cm. Mediastinum/Nodes: Enlarged supraclavicular nodes. RIGHT hilar mild adenopathy. RIGHT hilar node measures 11 mm. Mild subcarinal paratracheal adenopathy. No change from recent exam Lungs/Pleura: Innumerable bilateral pulmonary nodules of varying size scattered throughout the LEFT and RIGHT lung. No change from recent exam same day. No pulmonary infarction. Musculoskeletal: Multifocal sclerotic skeletal metastasis again noted. Review of the MIP images confirms the above findings. CT ABDOMEN and PELVIS FINDINGS Hepatobiliary: Large hepatic metastasis in the RIGHT hepatic lobe measuring 23 mm (image 9/3). Smaller metastatic lesion along the falciform ligament measuring 8 mm on image 17/3). Gallbladder distended. Pancreas: Pancreas is normal. No ductal dilatation. No pancreatic inflammation. Spleen: Normal spleen Adrenals/urinary tract: Bilateral enlargement of the adrenal glands consistent metastatic disease. Findings similar to comparison CT No ureteral obstruction.  Bladder normal. Stomach/Bowel: Stomach, small bowel, appendix, and cecum are normal. The colon and rectosigmoid colon are normal. Vascular/Lymphatic: Abdominal aorta is normal caliber with atherosclerotic calcification. There is no retroperitoneal or periportal lymphadenopathy. No pelvic lymphadenopathy. Reproductive: Uterus and adnexa unremarkable. Uterus has a lobular contour consistent leiomyoma. Other: Is rounded nodule in the ventral LEFT peritoneal space (image 71/3 no  change Musculoskeletal: Multifocal sclerotic skeletal metastasis no change Review of the MIP images confirms the above findings. IMPRESSION: Chest Impression: 1. No evidence acute pulmonary embolism. 2. Extensive bilateral multifocal pulmonary metastasis. 3. Enlargement of the pulmonary suggest pulmonary hypertension. 4. Mild metastatic mediastinal, supraclavicular and RIGHT axillary nodal metastasis Abdomen / Pelvis Impression: 1. hepatic metastasis stable 2. Bilateral adrenal metastasis.  No change 3. Small peritoneal nodule in the LEFT lower quadrant suggests peritoneal metastasis. 4. Widespread sclerotic skeletal metastasis unchanged. 5. No acute findings in the abdomen pelvis. Electronically Signed   By: SSuzy BouchardM.D.   On: 11/05/2022 18:15   CT Angio Chest Pulmonary Embolism (PE) W or WO Contrast  Result Date: 11/05/2022 CLINICAL DATA:  Abdominal pain. Acute abdominal pain nonlocalized. Stage IV breast cancer with lung metastasis. * Tracking Code: BO *. EXAM: CT ANGIOGRAPHY CHEST CT ABDOMEN AND PELVIS WITH CONTRAST TECHNIQUE: Multidetector CT imaging of the chest was performed using the standard protocol during bolus administration of intravenous contrast. Multiplanar CT image reconstructions and MIPs were obtained to evaluate the vascular anatomy. Multidetector  CT imaging of the abdomen and pelvis was performed using the standard protocol during bolus administration of intravenous contrast. RADIATION DOSE REDUCTION: This exam was performed according to the departmental dose-optimization program which includes automated exposure control, adjustment of the mA and/or kV according to patient size and/or use of iterative reconstruction technique. CONTRAST:  60m OMNIPAQUE IOHEXOL 350 MG/ML SOLN COMPARISON:  CT chest 11/05/2022, CT abdomen pelvis 10/11/2022 FINDINGS: CTA CHEST FINDINGS Cardiovascular: No filling defects within the pulmonary arteries to suggest acute pulmonary embolism. Pulmonary artery  is dilated 4.6 cm. Mediastinum/Nodes: Enlarged supraclavicular nodes. RIGHT hilar mild adenopathy. RIGHT hilar node measures 11 mm. Mild subcarinal paratracheal adenopathy. No change from recent exam Lungs/Pleura: Innumerable bilateral pulmonary nodules of varying size scattered throughout the LEFT and RIGHT lung. No change from recent exam same day. No pulmonary infarction. Musculoskeletal: Multifocal sclerotic skeletal metastasis again noted. Review of the MIP images confirms the above findings. CT ABDOMEN and PELVIS FINDINGS Hepatobiliary: Large hepatic metastasis in the RIGHT hepatic lobe measuring 23 mm (image 9/3). Smaller metastatic lesion along the falciform ligament measuring 8 mm on image 17/3). Gallbladder distended. Pancreas: Pancreas is normal. No ductal dilatation. No pancreatic inflammation. Spleen: Normal spleen Adrenals/urinary tract: Bilateral enlargement of the adrenal glands consistent metastatic disease. Findings similar to comparison CT No ureteral obstruction.  Bladder normal. Stomach/Bowel: Stomach, small bowel, appendix, and cecum are normal. The colon and rectosigmoid colon are normal. Vascular/Lymphatic: Abdominal aorta is normal caliber with atherosclerotic calcification. There is no retroperitoneal or periportal lymphadenopathy. No pelvic lymphadenopathy. Reproductive: Uterus and adnexa unremarkable. Uterus has a lobular contour consistent leiomyoma. Other: Is rounded nodule in the ventral LEFT peritoneal space (image 71/3 no change Musculoskeletal: Multifocal sclerotic skeletal metastasis no change Review of the MIP images confirms the above findings. IMPRESSION: Chest Impression: 1. No evidence acute pulmonary embolism. 2. Extensive bilateral multifocal pulmonary metastasis. 3. Enlargement of the pulmonary suggest pulmonary hypertension. 4. Mild metastatic mediastinal, supraclavicular and RIGHT axillary nodal metastasis Abdomen / Pelvis Impression: 1. hepatic metastasis stable 2.  Bilateral adrenal metastasis.  No change 3. Small peritoneal nodule in the LEFT lower quadrant suggests peritoneal metastasis. 4. Widespread sclerotic skeletal metastasis unchanged. 5. No acute findings in the abdomen pelvis. Electronically Signed   By: SSuzy BouchardM.D.   On: 11/05/2022 18:15   CT CHEST W CONTRAST  Result Date: 11/05/2022 CLINICAL DATA:  History of stage IV breast cancer presenting with one-week history of shortness of breath and acute onset chest pain EXAM: CT CHEST WITH CONTRAST TECHNIQUE: Multidetector CT imaging of the chest was performed during intravenous contrast administration. RADIATION DOSE REDUCTION: This exam was performed according to the departmental dose-optimization program which includes automated exposure control, adjustment of the mA and/or kV according to patient size and/or use of iterative reconstruction technique. CONTRAST:  768mOMNIPAQUE IOHEXOL 350 MG/ML SOLN COMPARISON:  CT chest dated 10/31/2021, CT chest dated 10/31/2021, PET-CT dated 10/05/2018 FINDINGS: Cardiovascular: Normal heart size. No significant pericardial fluid/thickening. Pulmonary artery measures 4.5 cm, previously 4.2 cm (remeasured). No central pulmonary emboli. Coronary artery calcifications and aortic atherosclerosis. Mediastinum/Nodes: Thyroidectomy. Small hiatal hernia. Multi station lymphadenopathy, for example left supraclavicular lymph node measures 14 mm (4:15), previously 6 mm, and right precarinal lymph node measures 12 mm (4:40), previously 8 mm. Left subpectoral node measures 9 mm. Lungs/Pleura: The central airways are patent. Innumerable bilateral pulmonary nodules, new and enlarged compared to 10/31/2021. No pneumothorax. No pleural effusion. Upper abdomen: Increased size of 2.7 cm segment 4 hepatic lesion (4:72), previously 2.4  cm and posterior segment 6 lesion measuring 1.6 cm (4:98), previously 0.9 cm. Bilateral adrenal nodules, unchanged, 1.7 cm on the right and 1.6 cm on the  left, likely adenomas. Musculoskeletal: New and enlarged extensive multifocal osseous metastases, for example 5 mm sclerotic focus in the left humeral head (4:7), new from 10/31/2021, and sclerotic lesion involving the right lateral aspect of T12 measuring 4.9 x 2.8 cm, previously 3.8 x 2.6 cm. Partially imaged breast demonstrates spiculated nodule in the left breast containing multiple metallic clips. Scattered subcutaneous soft tissue nodules along the posterior left thoracic region, for example measuring 7 mm (4:56), new compared to 10/31/2021. IMPRESSION: 1. No central pulmonary emboli. 2. Enlarged main pulmonary artery, can be seen in the setting of pulmonary hypertension. 3. Innumerable bilateral pulmonary nodules, new and enlarged compared to 10/31/2021, consistent with metastatic disease. 4. New and enlarged extensive multifocal osseous metastases. 5. Increased size of hepatic metastases. 6. Increased multi station mediastinal and left supraclavicular lymphadenopathy. 7. Scattered subcutaneous soft tissue nodules along the posterior left thoracic region, new compared to 10/31/2021, suspicious for metastatic disease. 8.  Aortic Atherosclerosis (ICD10-I70.0). Electronically Signed   By: Darrin Nipper M.D.   On: 11/05/2022 17:07   DG Chest 2 View  Result Date: 11/05/2022 CLINICAL DATA:  Chest pain. Shortness of breath x1 week with chest pain x1 day. History of stage IV breast cancer. EXAM: CHEST - 2 VIEW COMPARISON:  Chest radiograph 11/14/2021.  Chest CT 10/31/2021. FINDINGS: Interval increase in diffuse nodular opacities throughout both lungs, may reflect worsening metastatic disease versus superimposed infection or edema. Stable cardiac and mediastinal contours. Possible small left pleural effusion. No pneumothorax. IMPRESSION: Interval increase in diffuse nodular opacities throughout both lungs, may reflect worsening metastatic disease versus superimposed infection or edema. Electronically Signed   By:  Emmit Alexanders M.D.   On: 11/05/2022 11:59        Scheduled Meds:  diphenhydrAMINE  12.5 mg Intravenous Once   Continuous Infusions:  0.9 % NaCl with KCl 20 mEq / L 100 mL/hr at 11/05/22 2324   famotidine (PEPCID) IV     potassium chloride       LOS: 0 days    Time spent: 35 minutes    Crystin Lechtenberg A Marques Ericson, MD [facility]   If 7PM-7AM, please contact night-coverage [email or web address]  11/06/2022, 7:47 AM

## 2022-11-06 NOTE — ED Notes (Signed)
Assumed care of patient who arrived from home via ems for sob. Patient with hx of breast cancer that has mets to lungs and organs. Patient a/o x 3 has left arm restriction needs iv access for orders to be completed. IV team notified and arrived to place 22g iv in right lower forearm. Patient pending admission. Patient has no current needs. Patient sating 99% on room air respirations are even and non labored

## 2022-11-06 NOTE — ED Notes (Signed)
Family(stephanie, daughter) updated as to patient's status. Advised daughter that information/updates will be voiced to her only. Daughter voiced understanding. Daughter also states pt is HOH.

## 2022-11-06 NOTE — ED Notes (Signed)
Pt c/o shortness of breath, pt noted oxygen saturation 97% room air, oxygen 1L applied via nasal cannula. Dr Tyrell Antonio aware,  new order received for breathing treatment.

## 2022-11-07 ENCOUNTER — Ambulatory Visit
Admit: 2022-11-07 | Discharge: 2022-11-07 | Disposition: A | Discharge status: Home / Self Care | Payer: 59 | Attending: Radiation Oncology | Admitting: Radiation Oncology

## 2022-11-07 ENCOUNTER — Encounter: Payer: Self-pay | Admitting: *Deleted

## 2022-11-07 ENCOUNTER — Inpatient Hospital Stay (HOSPITAL_COMMUNITY): Payer: 59

## 2022-11-07 DIAGNOSIS — R1114 Bilious vomiting: Secondary | ICD-10-CM

## 2022-11-07 DIAGNOSIS — Z515 Encounter for palliative care: Secondary | ICD-10-CM

## 2022-11-07 DIAGNOSIS — Z711 Person with feared health complaint in whom no diagnosis is made: Secondary | ICD-10-CM

## 2022-11-07 LAB — BASIC METABOLIC PANEL
Anion gap: 9 (ref 5–15)
BUN: <5 mg/dL — ABNORMAL LOW (ref 8–23)
CO2: 20 mmol/L — ABNORMAL LOW (ref 22–32)
Calcium: 8.4 mg/dL — ABNORMAL LOW (ref 8.9–10.3)
Chloride: 110 mmol/L (ref 98–111)
Creatinine, Ser: 0.81 mg/dL (ref 0.44–1.00)
GFR, Estimated: >60 mL/min (ref >60)
Glucose, Bld: 98 mg/dL (ref 70–99)
Potassium: 3.7 mmol/L (ref 3.5–5.1)
Sodium: 139 mmol/L (ref 135–145)

## 2022-11-07 LAB — CBC
HCT: 30.9 % — ABNORMAL LOW (ref 36.0–46.0)
Hemoglobin: 10.9 g/dL — ABNORMAL LOW (ref 12.0–15.0)
MCH: 34.2 pg — ABNORMAL HIGH (ref 26.0–34.0)
MCHC: 35.3 g/dL (ref 30.0–36.0)
MCV: 96.9 fL (ref 80.0–100.0)
Platelets: 139 10*3/uL — ABNORMAL LOW (ref 150–400)
RBC: 3.19 MIL/uL — ABNORMAL LOW (ref 3.87–5.11)
RDW: 16.6 % — ABNORMAL HIGH (ref 11.5–15.5)
WBC: 2 10*3/uL — ABNORMAL LOW (ref 4.0–10.5)
nRBC: 0 % (ref 0.0–0.2)

## 2022-11-07 MED ORDER — GADOBUTROL 1 MMOL/ML IV SOLN
10.0000 mL | Freq: Once | INTRAVENOUS | Status: AC | PRN
  Administered 2022-11-07: 10 mL via INTRAVENOUS

## 2022-11-07 MED ORDER — SODIUM CHLORIDE 0.9 % IV SOLN
4.0000 mg | Freq: Three times a day (TID) | INTRAVENOUS | Status: DC
  Administered 2022-11-07 – 2022-11-09 (×5): 4 mg via INTRAVENOUS
  Filled 2022-11-07 (×8): qty 0.4

## 2022-11-07 MED ORDER — GADOBUTROL 1 MMOL/ML IV SOLN: 10.0000 mL | Freq: Once | INTRAVENOUS | Status: DC | PRN

## 2022-11-07 MED ORDER — SODIUM CHLORIDE 0.9 % IV SOLN
10.0000 mg | Freq: Once | INTRAVENOUS | Status: AC
  Administered 2022-11-07: 10 mg via INTRAVENOUS
  Filled 2022-11-07 (×2): qty 1

## 2022-11-07 MED ORDER — ONDANSETRON HCL 4 MG PO TABS: 4.0000 mg | ORAL_TABLET | Freq: Three times a day (TID) | ORAL | Status: DC | PRN

## 2022-11-07 MED ORDER — ONDANSETRON HCL 4 MG/2ML IJ SOLN
4.0000 mg | Freq: Three times a day (TID) | INTRAMUSCULAR | Status: DC | PRN
  Administered 2022-11-08 – 2022-11-17 (×2): 4 mg via INTRAVENOUS
  Filled 2022-11-07 (×2): qty 2

## 2022-11-07 NOTE — Evaluation (Signed)
Physical Therapy Evaluation Patient Details Name: Sheila Jacobs MRN: [id number] DOB: Jun 11, 1951 Today's Date: 11/07/2022  History of Present Illness  72 year old female sent to the ED for evaluation of chest pain, nausea and vomiting; with past medical history significant for hypertension, hyperlipidemia, GERD, goiter, OSA, prior PE, arthritis, metastatic breast cancer. .  Clinical Impression   Pt admitted with above diagnosis. Lives at home with daughter and granddaughter, in a home; unsure if steps to enter or level entry or ramped; My understanding from chart review is that prior to admission, pt was able to walk short distances with RW; Presents to PT with generalized weakness, decr activity tolerance;  Able to get up to EOB with HOB elevated and with min/mod assist; 2 person min assist to stand to rW and take a few pivot steps to recliner; Pt currently with functional limitations due to the deficits listed below (see PT Problem List). Pt will benefit from skilled PT to increase their independence and safety with mobility to allow discharge to the venue listed below.          Recommendations for follow up therapy are one component of a multi-disciplinary discharge planning process, led by the attending physician.  Recommendations may be updated based on patient status, additional functional criteria and insurance authorization.  Follow Up Recommendations Home health PT      Assistance Recommended at Discharge Intermittent Supervision/Assistance  Patient can return home with the following  A little help with walking and/or transfers;Help with stairs or ramp for entrance;Assistance with cooking/housework    Equipment Recommendations Hospital bed; wheelchair with cushion, BSC (Will need more reliable info re: home setup and available assist to make any equipment recs)   However, if a hospital bed and wheelchair would make day to day better easier, and safer for pt, daughter, and  granddaughter, happy to recommend them   Recommendations for Other Services    Noted Palliative Care Team already involved   Functional Status Assessment Patient has had a recent decline in their functional status and demonstrates the ability to make significant improvements in function in a reasonable and predictable amount of time.     Precautions / Restrictions Precautions Precautions: Fall Precaution Comments: Very HOH; writing notes to communicate clearly Restrictions Weight Bearing Restrictions: No      Mobility  Bed Mobility Overal bed mobility: Needs Assistance Bed Mobility: Supine to Sit     Supine to sit: Min assist, Mod assist     General bed mobility comments: Used bed rails and min handheld assist to pull to sit; light mod assist to square off hips at EOB; no nausea with getting up    Transfers Overall transfer level: Needs assistance Equipment used: Rolling walker (2 wheels) Transfers: Sit to/from Stand, Bed to chair/wheelchair/BSC Sit to Stand: Min assist, +2 safety/equipment   Step pivot transfers: Min assist, +2 safety/equipment       General transfer comment: Slow moving, but steadies self well with RW    Ambulation/Gait               General Gait Details: Would like to kow how much household walking pt typically does; Pt's answer seemed to indicate not much  Stairs            Wheelchair Mobility    Modified Rankin (Stroke Patients Only)       Balance  Pertinent Vitals/Pain Pain Assessment Pain Assessment: Faces Faces Pain Scale: Hurts a little bit Pain Location: L anterior shoulder Pain Descriptors / Indicators: Sore Pain Intervention(s): Monitored during session    Home Living Family/patient expects to be discharged to:: Private residence Living Arrangements: Children Available Help at Discharge: Family Type of Home: House Home Access: Level entry        Home Layout: One level Home Equipment:  (unknown at time of eval) Additional Comments: Home situation not entirely clear; portion of above info gleaned from chart review and will need to be verified    Prior Function Prior Level of Function : Needs assist (Not entirely clear)             Mobility Comments: When asked about how far she typically walks at home, pt waved her hand and gave a facial expression that seemed the answer is "not very much"; will need more reliable information ADLs Comments: When asked if pt needs help to bathe, pt said no; when asked if pt  sponge bathes or bathes in a tub shower, answered tub/shower; when asked if pt has a tub/shower seat, pt answered no     Hand Dominance        Extremity/Trunk Assessment   Upper Extremity Assessment Upper Extremity Assessment: Defer to OT evaluation    Lower Extremity Assessment Lower Extremity Assessment: Generalized weakness       Communication   Communication: HOH (Dependent on writing messages to communicate; Pt reads messages and can answer by speaking)  Cognition Arousal/Alertness: Awake/alert Behavior During Therapy: WFL for tasks assessed/performed Overall Cognitive Status: Difficult to assess                                          General Comments General comments (skin integrity, edema, etc.): NAD with basic mobility    Exercises     Assessment/Plan    PT Assessment Patient needs continued PT services  PT Problem List Decreased strength;Decreased range of motion;Decreased activity tolerance;Decreased balance;Decreased mobility;Decreased knowledge of use of DME;Pain;Decreased safety awareness;Decreased knowledge of precautions       PT Treatment Interventions DME instruction;Gait training;Stair training;Functional mobility training;Therapeutic activities;Therapeutic exercise;Balance training;Neuromuscular re-education;Patient/family education    PT Goals (Current goals can  be found in the Care Plan section)  Acute Rehab PT Goals Patient Stated Goal: Did not state, but agreeable to getting up PT Goal Formulation: Patient unable to participate in goal setting Time For Goal Achievement: 11/21/22 Potential to Achieve Goals: Good    Frequency Min 3X/week     Co-evaluation               AM-PAC PT "6 Clicks" Mobility  Outcome Measure Help needed turning from your back to your side while in a flat bed without using bedrails?: A Lot Help needed moving from lying on your back to sitting on the side of a flat bed without using bedrails?: A Lot Help needed moving to and from a bed to a chair (including a wheelchair)?: A Lot Help needed standing up from a chair using your arms (e.g., wheelchair or bedside chair)?: A Little Help needed to walk in hospital room?: A Lot Help needed climbing 3-5 steps with a railing? : A Lot 6 Click Score: 13    End of Session Equipment Utilized During Treatment: Gait belt Activity Tolerance: Patient tolerated treatment well Patient left: in chair;with call bell/phone  within reach Nurse Communication: Mobility status PT Visit Diagnosis: Other abnormalities of gait and mobility (R26.89);Muscle weakness (generalized) (M62.81)    Time: [id number] PT Time Calculation (min) (ACUTE ONLY): 34 min   Charges:   PT Evaluation $PT Eval Moderate Complexity: [place], PT  Acute Rehabilitation Services Office [phone]   [name] 11/07/2022, 3:36 PM

## 2022-11-07 NOTE — Progress Notes (Signed)
PT Cancellation Note  Patient Details Name: CHERYAL GOLDFINE MRN: [id number] DOB: 03-Jun-1951   Cancelled Treatment:    Reason Eval/Treat Not Completed: Patient at procedure or test/unavailable  Off the floor at MRI;  Will follow up later today as time allows;  Otherwise, will follow up for PT tomorrow;   Thank you,  Roney Marion, [place] [facility] [place] 11/07/2022, 10:23 AM

## 2022-11-07 NOTE — Progress Notes (Signed)
Brief Palliative Medicine Progress Note:  Did not receive phone call from daughter to schedule family meeting today.  Medical records reviewed including progress notes, labs, imaging. Noted results of MRI to include extensive brain metastasis. Oncology has discussed findings with patient's daughter/HCPOA/[name] and hospice has been recommended as this is an extremely poor prognostic indicator. Per documentation, she will be ready for final decisions Friday 3/1. PMT will follow up with [facility] tomorrow 2/29 to continue to offer support as well as discuss diagnosis, prognosis, GOC, EOL wishes, disposition, and options.  Thank you for allowing PMT to assist in the care of this patient.  Jadakiss Barish M. Tamala Julian [facility] Palliative Medicine Team Team Phone: [phone] NO CHARGE

## 2022-11-07 NOTE — Evaluation (Signed)
Occupational Therapy Evaluation Patient Details Name: Sheila Jacobs MRN: [id number] DOB: Oct 15, 1950 Today's Date: 11/07/2022   History of Present Illness 72 year old female sent to the ED for evaluation of chest pain, nausea and vomiting; with past medical history significant for hypertension, hyperlipidemia, GERD, goiter, OSA, prior PE, arthritis, metastatic breast cancer. .   Clinical Impression   PTA, pt lives at home with daughter and grand daughter and per pt report, bathes independently; unsure of accuracy of report. Upon eval, pt performing UB ADL with set-up and LB ADL with up to max A. Pt performing SPT transfers to chair with min A and use of RW. Per pt report, seems like she transferred to a wheelchair at baseline. Will plan to follow up with family. Recommending discharge home with HHOT to optimize safety and independence in ADL.      Recommendations for follow up therapy are one component of a multi-disciplinary discharge planning process, led by the attending physician.  Recommendations may be updated based on patient status, additional functional criteria and insurance authorization.   Follow Up Recommendations  Home health OT     Assistance Recommended at Discharge Intermittent Supervision/Assistance  Patient can return home with the following A little help with walking and/or transfers;A lot of help with bathing/dressing/bathroom;Assistance with cooking/housework;Assist for transportation;Help with stairs or ramp for entrance    Functional Status Assessment  Patient has had a recent decline in their functional status and demonstrates the ability to make significant improvements in function in a reasonable and predictable amount of time.  Equipment Recommendations  Hospital bed;Wheelchair (measurements OT);Wheelchair cushion (measurements OT);BSC/3in1 (Need to confirm home equipment)    Recommendations for Other Services       Precautions / Restrictions  Precautions Precautions: Fall Precaution Comments: Very HOH; writing notes to communicate clearly Restrictions Weight Bearing Restrictions: No      Mobility Bed Mobility Overal bed mobility: Needs Assistance Bed Mobility: Supine to Sit     Supine to sit: Min assist, Mod assist     General bed mobility comments: Used bed rails and min handheld assist to pull to sit; light mod assist to square off hips at EOB; no nausea with getting up    Transfers Overall transfer level: Needs assistance Equipment used: Rolling walker (2 wheels) Transfers: Sit to/from Stand, Bed to chair/wheelchair/BSC Sit to Stand: Min assist, +2 safety/equipment     Step pivot transfers: Min assist, +2 safety/equipment     General transfer comment: Slow moving, but steadies self well with RW      Balance                                           ADL either performed or assessed with clinical judgement   ADL Overall ADL's : Needs assistance/impaired Eating/Feeding: Set up;Bed level   Grooming: Set up;Sitting   Upper Body Bathing: Set up;Sitting   Lower Body Bathing: Maximal assistance;Sit to/from stand   Upper Body Dressing : Set up;Sitting   Lower Body Dressing: Maximal assistance;Sit to/from stand   Toilet Transfer: Minimal assistance;Stand-pivot;Rolling walker (2 wheels);BSC/3in1 Toilet Transfer Details (indicate cue type and reason): bed to chair Toileting- Clothing Manipulation and Hygiene: Minimal assistance;Sit to/from stand       Functional mobility during ADLs: Minimal assistance;Rolling walker (2 wheels) (SPT)       Vision Baseline Vision/History: 0 No visual deficits Ability to See in Adequate  Light: 0 Adequate Patient Visual Report: No change from baseline Vision Assessment?: No apparent visual deficits Additional Comments: reading hand-written communication throughout session     Perception     Praxis      Pertinent Vitals/Pain Pain  Assessment Pain Assessment: Faces Faces Pain Scale: Hurts a little bit Pain Location: L anterior shoulder Pain Descriptors / Indicators: Sore Pain Intervention(s): Limited activity within patient's tolerance, Monitored during session     Hand Dominance     Extremity/Trunk Assessment Upper Extremity Assessment Upper Extremity Assessment: Generalized weakness   Lower Extremity Assessment Lower Extremity Assessment: Defer to PT evaluation       Communication Communication Communication: HOH (Dependent on writing messages to communicate; Pt reads messages and can answer by speaking)   Cognition Arousal/Alertness: Awake/alert Behavior During Therapy: WFL for tasks assessed/performed Overall Cognitive Status: Difficult to assess                                 General Comments: Following written commands with increased time     General Comments  NAD with basic mobility    Exercises     Shoulder Instructions      Home Living Family/patient expects to be discharged to:: Private residence Living Arrangements: Children Available Help at Discharge: Family Type of Home: House Home Access: Level entry     Home Layout: One level     Bathroom Shower/Tub: Teacher, early years/pre:  (unknown at time of eval)     Home Equipment:  (unknown at time of eval)   Additional Comments: Home situation not entirely clear; portion of above info gleaned from chart review and will need to be verified      Prior Functioning/Environment Prior Level of Function : Needs assist (not entirely clear)             Mobility Comments: When asked about how far she typically walks at home, pt waved her hand and gave a facial expression that seemed the answer is "not very much"; will need more reliable information ADLs Comments: When asked if pt needs help to bathe, pt said no; when asked if pt  sponge bathes or bathes in a tub shower, answered tub/shower; when asked if  pt has a tub/shower seat, pt answered no        OT Problem List: Decreased strength;Decreased activity tolerance;Impaired balance (sitting and/or standing);Decreased safety awareness;Decreased knowledge of use of DME or AE;Cardiopulmonary status limiting activity;Pain      OT Treatment/Interventions: Self-care/ADL training;Therapeutic exercise;DME and/or AE instruction;Balance training;Patient/family education;Therapeutic activities    OT Goals(Current goals can be found in the care plan section) Acute Rehab OT Goals Patient Stated Goal: go home OT Goal Formulation: With patient Time For Goal Achievement: 11/21/22 Potential to Achieve Goals: Good  OT Frequency: Min 2X/week    Co-evaluation              AM-PAC OT "6 Clicks" Daily Activity     Outcome Measure Help from another person eating meals?: None Help from another person taking care of personal grooming?: A Little Help from another person toileting, which includes using toliet, bedpan, or urinal?: A Little Help from another person bathing (including washing, rinsing, drying)?: A Lot Help from another person to put on and taking off regular upper body clothing?: A Little Help from another person to put on and taking off regular lower body clothing?: Total 6 Click Score: 16  End of Session Equipment Utilized During Treatment: Gait belt;Rolling walker (2 wheels) Nurse Communication: Mobility status  Activity Tolerance: Patient tolerated treatment well Patient left: in chair;with call bell/phone within reach  OT Visit Diagnosis: Unsteadiness on feet (R26.81);Muscle weakness (generalized) (M62.81);Other abnormalities of gait and mobility (R26.89);Pain Pain - Right/Left: Left Pain - part of body: Shoulder                Time: 1340-1409 OT Time Calculation (min): 29 min Charges:  OT General Charges $OT Visit: 1 Visit OT Evaluation $OT Eval Low Complexity: 1 Low  Elder Cyphers, OTR/L [facility] Acute  Rehabilitation Office: [phone]   Magnus Ivan 11/07/2022, 5:17 PM

## 2022-11-07 NOTE — Progress Notes (Signed)
Received message from [facility] team stating pt has been approved for Xgeva due to poor venous access.  Per MD dental clearance is not needed.  RN sent message to scheduling team to add lab and injection appt to 01/30/23.

## 2022-11-07 NOTE — Progress Notes (Signed)
This patient has had 5 IVs in 2 days. Very poor vasculature. Deep or small veins. L arm restricted. Fran Lowes, RN VAST

## 2022-11-07 NOTE — Progress Notes (Signed)
PROGRESS NOTE  Sheila Jacobs [id number] DOB: 1951/07/22 DOA: 11/05/2022 PCP: Arthur Holms, NP   LOS: 1 day   Brief Narrative / Interim history: 72 year old with past medical history significant for hypertension, hyperlipidemia, GERD, goiter, OSA, prior PE, arthritis, metastatic breast cancer. Sent to the ED for evaluation of chest pain, nausea and vomiting. Patient is not a good historian.   Subjective / 24h Interval events: Poor historian, very hard of hearing but no significant complaints this morning, she tells me that she is doing "fine"  Assesement and Plan: Principal Problem:   Nausea and vomiting Active Problems:   Hypertension   Breast cancer of upper-outer quadrant of left female breast (HCC)   Generalized anxiety disorder   Hypothyroidism   History of pulmonary embolus (PE), 12/2017   Chronic anticoagulation   Multiple pulmonary nodules   Hypokalemia   FTT (failure to thrive) in adult   Hyperkalemia   Hyponatremia   Principal problem Nausea, vomiting - with metabolic acidosis, hypokalemia. Continue supportive care with scheduled Zofran, and IV fluids. Could be related to [facility], ok to hold per Dr Lindi Adie    Active problems Chest pain - Suspect related to metastasis diseases.  CTA negative for PE   Acute Metabolic encephalopathy - Could be related to dehydration.  An MRI of the brain is pending   Swelling bottom Lip -Per Daughter patient lower lip has been always larger.  Monitor  Breast cancer upper outer quadrant left, multiple pulmonary nodules, liver lung bone peritoneal mets, CT chest multiple lung mets. Liver mets, bone mets - Palliative care consulted. Dr Lindi Adie informed of patient admission.    History of PE - Resume Eliquis.    HTN - On metoprolol. Hold Norvasc, BP soft.    Hypothyroidism - Continue with Synthroid.    Leukopenia/Pancytopenia  - In setting malignancy. Monitor.    Hypokalemia - replete IV and with IV fluids.   Scheduled  Meds:  apixaban  5 mg Oral BID   feeding supplement  237 mL Oral BID BM   ipratropium-albuterol  3 mL Nebulization TID   levothyroxine  100 mcg Oral QAC breakfast   metoprolol succinate  50 mg Oral Daily   ondansetron  4 mg Oral TID   Or   ondansetron (ZOFRAN) IV  4 mg Intravenous TID   pantoprazole  40 mg Oral Daily   polyethylene glycol  17 g Oral BID   Continuous Infusions:  0.9 % NaCl with KCl 20 mEq / L 75 mL/hr at 11/07/22 0343   famotidine (PEPCID) IV Stopped (11/06/22 2243)   PRN Meds:.acetaminophen **OR** acetaminophen, senna-docusate  Current Outpatient Medications  Medication Instructions   acetaminophen (TYLENOL) 500 mg, Oral, Every 6 hours PRN   amLODipine (NORVASC) 5 mg, Oral, Daily   cyanocobalamin (VITAMIN B12) 1,000 mcg, Oral, Daily   ELIQUIS 5 MG TABS tablet TAKE 1 TABLET BY MOUTH TWICE A DAY   Ferrous Gluconate (IRON 27 PO) 1 tablet, Oral, 2 times daily   gabapentin (NEURONTIN) 100 mg, Oral, Daily at bedtime   letrozole (FEMARA) 2.5 mg, Oral, Daily   levothyroxine (SYNTHROID) 100 mcg, Oral, Daily before breakfast   metoprolol succinate (TOPROL-XL) 50 mg, Oral, Daily   nitroGLYCERIN (NITROSTAT) 0.4 mg, Every 5 min PRN   omeprazole (PRILOSEC) 20 mg, Oral, Daily   ondansetron (ZOFRAN-ODT) 4 mg, Oral, Every 8 hours PRN   potassium chloride SA (KLOR-CON M) 20 MEQ tablet 20 mEq, Oral, 2 times daily   triamcinolone cream (KENALOG) 0.1 % 1  application , Daily PRN   Verzenio 100 mg, Oral, 2 times daily, Swallow tablets whole. Do not chew, crush, or split tablets before swallowing.   Vitamin D 5,000 Units, Oral, Daily    Diet Orders (From admission, onward)     Start     Ordered   11/05/22 2140  Diet regular Room service appropriate? Yes; Fluid consistency: Thin  Diet effective now       Question Answer Comment  Room service appropriate? Yes   Fluid consistency: Thin      11/05/22 2139            DVT prophylaxis:  apixaban (ELIQUIS) tablet 5 mg   Lab  Results  Component Value Date   PLT 139 (L) 11/07/2022      Code Status: Full Code  Family Communication: no family at bedside   Status is: Inpatient  Remains inpatient appropriate because: severity of illness  Level of care: Telemetry Medical  Consultants:  Palliative Oncology  Objective: Vitals:   11/06/22 1829 11/06/22 1922 11/06/22 2027 11/07/22 0332  BP: 106/63 (!) 104/55  110/61  Pulse: 86 89 81 88  Resp: 18 20 18 18  $ Temp: 98.1 F (36.7 C) 98 F (36.7 C)  98 F (36.7 C)  TempSrc: Oral Oral    SpO2: 100% 100% 100% 100%  Weight:      Height:        Intake/Output Summary (Last 24 hours) at 11/07/2022 [id number] Last data filed at 11/07/2022 0537 Gross per 24 hour  Intake 2677.73 ml  Output 150 ml  Net 2527.73 ml   Wt Readings from Last 3 Encounters:  11/05/22 100.7 kg  10/31/22 100.7 kg  10/11/22 111.6 kg    Examination:  Constitutional: NAD Eyes: no scleral icterus ENMT: Mucous membranes are moist.  Neck: normal, supple Respiratory: clear to auscultation bilaterally, no wheezing, no crackles. Normal respiratory effort. No accessory muscle use.  Cardiovascular: Regular rate and rhythm, no murmurs / rubs / gallops. No LE edema.  Abdomen: non distended, no tenderness. Bowel sounds positive.  Musculoskeletal: no clubbing / cyanosis.    Data Reviewed: I have independently reviewed following labs and imaging studies   CBC Recent Labs  Lab 10/31/22 1122 11/05/22 1115 11/05/22 1355 11/06/22 0327 11/07/22 0140  WBC 4.2 2.8*  --  2.1* 2.0*  HGB 12.6 13.8 12.6 11.4* 10.9*  HCT 37.1 40.0 37.0 33.3* 30.9*  PLT 168 162  --  143* 139*  MCV 94.9 97.6  --  98.5 96.9  MCH 32.2 33.7  --  33.7 34.2*  MCHC 34.0 34.5  --  34.2 35.3  RDW 15.1 16.1*  --  16.3* 16.6*  LYMPHSABS 1.9  --   --  0.8  --   MONOABS 0.4  --   --  0.1  --   EOSABS 0.0  --   --  0.0  --   BASOSABS 0.0  --   --  0.1  --     Recent Labs  Lab 10/31/22 1122 11/05/22 1115 11/05/22 1343  11/05/22 1355 11/06/22 0327 11/07/22 0140  NA 138 139  --  140 135 139  K 3.0* 2.8*  --  2.7* 3.1* 3.7  CL 100 101  --   --  101 110  CO2 25 21*  --   --  21* 20*  GLUCOSE 124* 112*  --   --  97 98  BUN 13 6*  --   --  5* <5*  CREATININE  1.09* 1.07*  --   --  0.87 0.81  CALCIUM 10.1 8.7*  --   --  8.1* 8.4*  AST 33 49*  --   --   --   --   ALT 19 26  --   --   --   --   ALKPHOS 428* 345*  --   --   --   --   BILITOT 1.0 1.5*  --   --   --   --   ALBUMIN 3.5 3.3*  --   --   --   --   MG  --  2.4  --   --  2.5*  --   LATICACIDVEN  --   --  3.3*  --   --   --   BNP  --  45.1  --   --   --   --     ------------------------------------------------------------------------------------------------------------------ No results for input(s): "CHOL", "HDL", "LDLCALC", "TRIG", "CHOLHDL", "LDLDIRECT" in the last 72 hours.  Lab Results  Component Value Date   HGBA1C 5.4 08/17/2020   ------------------------------------------------------------------------------------------------------------------ No results for input(s): "TSH", "T4TOTAL", "T3FREE", "THYROIDAB" in the last 72 hours.  Invalid input(s): "FREET3"  Cardiac Enzymes No results for input(s): "CKMB", "TROPONINI", "MYOGLOBIN" in the last 168 hours.  Invalid input(s): "CK" ------------------------------------------------------------------------------------------------------------------    Component Value Date/Time   BNP 45.1 11/05/2022 1115   BNP 85.3 07/31/2016 1200    CBG: Recent Labs  Lab 11/05/22 1140  GLUCAP 96    Recent Results (from the past 240 hour(s))  Resp panel by RT-PCR (RSV, Flu A&B, Covid) Anterior Nasal Swab     Status: None   Collection Time: 11/05/22 12:08 PM   Specimen: Anterior Nasal Swab  Result Value Ref Range Status   SARS Coronavirus 2 by RT PCR NEGATIVE NEGATIVE Final   Influenza A by PCR NEGATIVE NEGATIVE Final   Influenza B by PCR NEGATIVE NEGATIVE Final    Comment: (NOTE) The Xpert  Xpress SARS-CoV-2/FLU/RSV plus assay is intended as an aid in the diagnosis of influenza from Nasopharyngeal swab specimens and should not be used as a sole basis for treatment. Nasal washings and aspirates are unacceptable for Xpert Xpress SARS-CoV-2/FLU/RSV testing.  Fact Sheet for Patients: EntrepreneurPulse.com.au  Fact Sheet for Healthcare Providers: IncredibleEmployment.be  This test is not yet approved or cleared by the [place] FDA and has been authorized for detection and/or diagnosis of SARS-CoV-2 by FDA under an Emergency Use Authorization (EUA). This EUA will remain in effect (meaning this test can be used) for the duration of the COVID-19 declaration under Section 564(b)(1) of the Act, 21 U.S.C. section 360bbb-3(b)(1), unless the authorization is terminated or revoked.     Resp Syncytial Virus by PCR NEGATIVE NEGATIVE Final    Comment: (NOTE) Fact Sheet for Patients: EntrepreneurPulse.com.au  Fact Sheet for Healthcare Providers: IncredibleEmployment.be  This test is not yet approved or cleared by the [place] FDA and has been authorized for detection and/or diagnosis of SARS-CoV-2 by FDA under an Emergency Use Authorization (EUA). This EUA will remain in effect (meaning this test can be used) for the duration of the COVID-19 declaration under Section 564(b)(1) of the Act, 21 U.S.C. section 360bbb-3(b)(1), unless the authorization is terminated or revoked.  Performed at [facility], [place] [address] [place], [place] [address]      Radiology Studies: No results found.   Marzetta Board, MD, PhD [facility]  Between 7 am - 7 pm I am available,  please contact me via Amion (for emergencies) or Securechat (non urgent messages)  Between 7 pm - 7 am I am not available, please contact night coverage MD/APP via Amion

## 2022-11-07 NOTE — Progress Notes (Signed)
Dear Doctor:  This patient has been identified as a candidate for PICC / CVCfor the following reason (s): IV therapy over 48 hours, drug pH or osmolality (causing phlebitis, infiltration in 24 hours), poor veins/poor circulatory system (CHF, COPD, emphysema, diabetes, steroid use, IV drug abuse, etc.), and restarts due to phlebitis and infiltration in 24 hours If you agree, please write an order for the indicated device.  Thank you for supporting the early vascular access assessment program.

## 2022-11-07 NOTE — Progress Notes (Signed)
Oncology  I reviewed the results of the brain MRI and discussed the case in detail with patient's daughter and power of attorney [name]. Extensive parenchymal brain metastases and leptomeningeal involvement are considered to be extremely poor prognostic factors. I also discussed the case with Dr. Sondra Come.  Dr.Kinard plans to discuss with [name] about pros and cons of radiation.  We feel that her prognosis would be relatively the same even with or without radiation.  My recommendation is to consult with hospice.  [name] will come on Friday and make a decision.  I discussed with her to talk to her family about her end-of-life status and make sure that they can visit her sooner than later.

## 2022-11-08 ENCOUNTER — Ambulatory Visit
Admit: 2022-11-08 | Discharge: 2022-11-08 | Disposition: A | Discharge status: Home / Self Care | Payer: 59 | Attending: Radiation Oncology | Admitting: Radiation Oncology

## 2022-11-08 DIAGNOSIS — Z789 Other specified health status: Secondary | ICD-10-CM

## 2022-11-08 DIAGNOSIS — R112 Nausea with vomiting, unspecified: Secondary | ICD-10-CM | POA: Diagnosis not present

## 2022-11-08 DIAGNOSIS — C50912 Malignant neoplasm of unspecified site of left female breast: Secondary | ICD-10-CM

## 2022-11-08 DIAGNOSIS — E876 Hypokalemia: Secondary | ICD-10-CM | POA: Diagnosis not present

## 2022-11-08 DIAGNOSIS — R079 Chest pain, unspecified: Secondary | ICD-10-CM | POA: Diagnosis not present

## 2022-11-08 DIAGNOSIS — R1114 Bilious vomiting: Secondary | ICD-10-CM | POA: Diagnosis not present

## 2022-11-08 LAB — COMPREHENSIVE METABOLIC PANEL
ALT: 17 U/L (ref 0–44)
AST: 34 U/L (ref 15–41)
Albumin: 2.5 g/dL — ABNORMAL LOW (ref 3.5–5.0)
Alkaline Phosphatase: 266 U/L — ABNORMAL HIGH (ref 38–126)
Anion gap: 8 (ref 5–15)
BUN: 5 mg/dL — ABNORMAL LOW (ref 8–23)
CO2: 19 mmol/L — ABNORMAL LOW (ref 22–32)
Calcium: 8.1 mg/dL — ABNORMAL LOW (ref 8.9–10.3)
Chloride: 111 mmol/L (ref 98–111)
Creatinine, Ser: 0.8 mg/dL (ref 0.44–1.00)
GFR, Estimated: >60 mL/min (ref >60)
Glucose, Bld: 124 mg/dL — ABNORMAL HIGH (ref 70–99)
Potassium: 4.1 mmol/L (ref 3.5–5.1)
Sodium: 138 mmol/L (ref 135–145)
Total Bilirubin: 1.3 mg/dL — ABNORMAL HIGH (ref 0.3–1.2)
Total Protein: 5.6 g/dL — ABNORMAL LOW (ref 6.5–8.1)

## 2022-11-08 LAB — CBC
HCT: 30.9 % — ABNORMAL LOW (ref 36.0–46.0)
Hemoglobin: 10.8 g/dL — ABNORMAL LOW (ref 12.0–15.0)
MCH: 34.1 pg — ABNORMAL HIGH (ref 26.0–34.0)
MCHC: 35 g/dL (ref 30.0–36.0)
MCV: 97.5 fL (ref 80.0–100.0)
Platelets: 148 10*3/uL — ABNORMAL LOW (ref 150–400)
RBC: 3.17 MIL/uL — ABNORMAL LOW (ref 3.87–5.11)
RDW: 16.7 % — ABNORMAL HIGH (ref 11.5–15.5)
WBC: 2.1 10*3/uL — ABNORMAL LOW (ref 4.0–10.5)
nRBC: 0.9 % — ABNORMAL HIGH (ref 0.0–0.2)

## 2022-11-08 LAB — TSH: TSH: 1.538 u[IU]/mL (ref 0.350–4.500)

## 2022-11-08 LAB — MAGNESIUM: Magnesium: 2.2 mg/dL (ref 1.7–2.4)

## 2022-11-08 MED ORDER — HALOPERIDOL LACTATE 5 MG/ML IJ SOLN
INTRAMUSCULAR | Status: AC
  Filled 2022-11-08: qty 1

## 2022-11-08 MED ORDER — IPRATROPIUM-ALBUTEROL 0.5-2.5 (3) MG/3ML IN SOLN
3.0000 mL | Freq: Four times a day (QID) | RESPIRATORY_TRACT | Status: DC | PRN
  Administered 2022-11-14 – 2022-11-21 (×9): 3 mL via RESPIRATORY_TRACT
  Filled 2022-11-08 (×9): qty 3

## 2022-11-08 MED ORDER — HALOPERIDOL LACTATE 5 MG/ML IJ SOLN
2.0000 mg | Freq: Once | INTRAMUSCULAR | Status: AC
  Administered 2022-11-08: 2 mg via INTRAVENOUS

## 2022-11-08 NOTE — Progress Notes (Signed)
PROGRESS NOTE  Sheila Jacobs [id number] DOB: 24-Sep-1950 DOA: 11/05/2022 PCP: Arthur Holms, NP   LOS: 2 days   Brief Narrative / Interim history: 72 year old with past medical history significant for hypertension, hyperlipidemia, GERD, goiter, OSA, prior PE, arthritis, metastatic breast cancer. Sent to the ED for evaluation of chest pain, nausea and vomiting. Patient is not a good historian.   Subjective / 24h Interval events: Feels well, asking to go home  Assesement and Plan: Principal Problem:   Nausea and vomiting Active Problems:   Hypertension   Breast cancer of upper-outer quadrant of left female breast (HCC)   Generalized anxiety disorder   Hypothyroidism   History of pulmonary embolus (PE), 12/2017   Chronic anticoagulation   Multiple pulmonary nodules   Hypokalemia   FTT (failure to thrive) in adult   Hyperkalemia   Hyponatremia   Principal problem Nausea, vomiting - with metabolic acidosis, hypokalemia. Continue supportive care with scheduled Zofran, and IV fluids. Could be related to [facility], ok to hold per Dr Lindi Adie  -better today as she was started on steroids   Active problems Stage IV Breast cancer upper outer quadrant left, multiple pulmonary nodules, liver lung bone peritoneal mets, CT chest multiple lung mets. Liver mets, bone mets - MRI of the brain unfortunately shows new onset significant metastatic disease. Radiation oncology consult pending, however she has an extremely poor prognosis. Continue decadron. Palliative care consulted as well. [place] discussions underway   Chest pain - Suspect related to metastasis diseases.  CTA negative for PE   Acute Metabolic encephalopathy - improving some, possibly related to her brain mets / dehydration   Swelling bottom Lip -Per Daughter patient lower lip has been always larger.  Monitor  History of PE - Resume Eliquis.    HTN - On metoprolol. Hold Norvasc, BP soft.    Hypothyroidism - Continue with  Synthroid.    Leukopenia/Pancytopenia  - In setting malignancy. Monitor.    Hypokalemia - replete IV and with IV fluids.   Scheduled Meds:  apixaban  5 mg Oral BID   feeding supplement  237 mL Oral BID BM   ipratropium-albuterol  3 mL Nebulization TID   levothyroxine  100 mcg Oral QAC breakfast   metoprolol succinate  50 mg Oral Daily   pantoprazole  40 mg Oral Daily   polyethylene glycol  17 g Oral BID   Continuous Infusions:  0.9 % NaCl with KCl 20 mEq / L 75 mL/hr at 11/07/22 2341   dexamethasone (DECADRON) IVPB ([facility]) 4 mg (11/08/22 0724)   famotidine (PEPCID) IV 20 mg (11/08/22 1101)   PRN Meds:.acetaminophen **OR** acetaminophen, gadobutrol, ondansetron **OR** ondansetron (ZOFRAN) IV, senna-docusate  Current Outpatient Medications  Medication Instructions   acetaminophen (TYLENOL) 500 mg, Oral, Every 6 hours PRN   amLODipine (NORVASC) 5 mg, Oral, Daily   cyanocobalamin (VITAMIN B12) 1,000 mcg, Oral, Daily   ELIQUIS 5 MG TABS tablet TAKE 1 TABLET BY MOUTH TWICE A DAY   Ferrous Gluconate (IRON 27 PO) 1 tablet, Oral, 2 times daily   gabapentin (NEURONTIN) 100 mg, Oral, Daily at bedtime   letrozole (FEMARA) 2.5 mg, Oral, Daily   levothyroxine (SYNTHROID) 100 mcg, Oral, Daily before breakfast   metoprolol succinate (TOPROL-XL) 50 mg, Oral, Daily   nitroGLYCERIN (NITROSTAT) 0.4 mg, Sublingual, Every 5 min PRN   omeprazole (PRILOSEC) 20 mg, Oral, Daily   ondansetron (ZOFRAN-ODT) 4 mg, Oral, Every 8 hours PRN   potassium chloride SA (KLOR-CON M) 20 MEQ tablet 20 mEq,  Oral, 2 times daily   triamcinolone cream (KENALOG) 0.1 % 1 application , Topical, Daily PRN   Verzenio 100 mg, Oral, 2 times daily, Swallow tablets whole. Do not chew, crush, or split tablets before swallowing.   Vitamin D 5,000 Units, Oral, Daily    Diet Orders (From admission, onward)     Start     Ordered   11/05/22 2140  Diet regular Room service appropriate? Yes; Fluid consistency: Thin  Diet effective  now       Question Answer Comment  Room service appropriate? Yes   Fluid consistency: Thin      11/05/22 2139            DVT prophylaxis:  apixaban (ELIQUIS) tablet 5 mg   Lab Results  Component Value Date   PLT 148 (L) 11/08/2022      Code Status: Full Code  Family Communication: no family at bedside   Status is: Inpatient  Remains inpatient appropriate because: severity of illness  Level of care: Telemetry Medical  Consultants:  Palliative Oncology  Objective: Vitals:   11/07/22 2110 11/08/22 0500 11/08/22 0835 11/08/22 0900  BP:  117/61  117/63  Pulse: 78 77  81  Resp: 20 (!) 21  18  Temp:  (!) 97.5 F (36.4 C)  98 F (36.7 C)  TempSrc:  Axillary    SpO2: 93% 97% 100%   Weight:      Height:        Intake/Output Summary (Last 24 hours) at 11/08/2022 1341 Last data filed at 11/08/2022 0500 Gross per 24 hour  Intake 240 ml  Output 950 ml  Net -710 ml    Wt Readings from Last 3 Encounters:  11/05/22 100.7 kg  10/31/22 100.7 kg  10/11/22 111.6 kg    Examination: Constitutional: NAD Eyes: lids and conjunctivae normal, no scleral icterus ENMT: mmm Neck: normal, supple Respiratory: clear to auscultation bilaterally, no wheezing, no crackles.  Cardiovascular: Regular rate and rhythm, no murmurs / rubs / gallops. No LE edema. Abdomen: soft, no distention, no tenderness. Bowel sounds positive.  Skin: no rashes   Data Reviewed: I have independently reviewed following labs and imaging studies   CBC Recent Labs  Lab 11/05/22 1115 11/05/22 1355 11/06/22 0327 11/07/22 0140 11/08/22 0304  WBC 2.8*  --  2.1* 2.0* 2.1*  HGB 13.8 12.6 11.4* 10.9* 10.8*  HCT 40.0 37.0 33.3* 30.9* 30.9*  PLT 162  --  143* 139* 148*  MCV 97.6  --  98.5 96.9 97.5  MCH 33.7  --  33.7 34.2* 34.1*  MCHC 34.5  --  34.2 35.3 35.0  RDW 16.1*  --  16.3* 16.6* 16.7*  LYMPHSABS  --   --  0.8  --   --   MONOABS  --   --  0.1  --   --   EOSABS  --   --  0.0  --   --    BASOSABS  --   --  0.1  --   --      Recent Labs  Lab 11/05/22 1115 11/05/22 1343 11/05/22 1355 11/06/22 0327 11/07/22 0140 11/08/22 0304  NA 139  --  140 135 139 138  K 2.8*  --  2.7* 3.1* 3.7 4.1  CL 101  --   --  101 110 111  CO2 21*  --   --  21* 20* 19*  GLUCOSE 112*  --   --  97 98 124*  BUN 6*  --   --  5* <5* 5*  CREATININE 1.07*  --   --  0.87 0.81 0.80  CALCIUM 8.7*  --   --  8.1* 8.4* 8.1*  AST 49*  --   --   --   --  34  ALT 26  --   --   --   --  17  ALKPHOS 345*  --   --   --   --  266*  BILITOT 1.5*  --   --   --   --  1.3*  ALBUMIN 3.3*  --   --   --   --  2.5*  MG 2.4  --   --  2.5*  --  2.2  LATICACIDVEN  --  3.3*  --   --   --   --   TSH  --   --   --   --   --  1.538  BNP 45.1  --   --   --   --   --      ------------------------------------------------------------------------------------------------------------------ No results for input(s): "CHOL", "HDL", "LDLCALC", "TRIG", "CHOLHDL", "LDLDIRECT" in the last 72 hours.  Lab Results  Component Value Date   HGBA1C 5.4 08/17/2020   ------------------------------------------------------------------------------------------------------------------ Recent Labs    11/08/22 0304  TSH 1.538    Cardiac Enzymes No results for input(s): "CKMB", "TROPONINI", "MYOGLOBIN" in the last 168 hours.  Invalid input(s): "CK" ------------------------------------------------------------------------------------------------------------------    Component Value Date/Time   BNP 45.1 11/05/2022 1115   BNP 85.3 07/31/2016 1200    CBG: Recent Labs  Lab 11/05/22 1140  GLUCAP 96     Recent Results (from the past 240 hour(s))  Resp panel by RT-PCR (RSV, Flu A&B, Covid) Anterior Nasal Swab     Status: None   Collection Time: 11/05/22 12:08 PM   Specimen: Anterior Nasal Swab  Result Value Ref Range Status   SARS Coronavirus 2 by RT PCR NEGATIVE NEGATIVE Final   Influenza A by PCR NEGATIVE NEGATIVE Final    Influenza B by PCR NEGATIVE NEGATIVE Final    Comment: (NOTE) The Xpert Xpress SARS-CoV-2/FLU/RSV plus assay is intended as an aid in the diagnosis of influenza from Nasopharyngeal swab specimens and should not be used as a sole basis for treatment. Nasal washings and aspirates are unacceptable for Xpert Xpress SARS-CoV-2/FLU/RSV testing.  Fact Sheet for Patients: EntrepreneurPulse.com.au  Fact Sheet for Healthcare Providers: IncredibleEmployment.be  This test is not yet approved or cleared by the [place] FDA and has been authorized for detection and/or diagnosis of SARS-CoV-2 by FDA under an Emergency Use Authorization (EUA). This EUA will remain in effect (meaning this test can be used) for the duration of the COVID-19 declaration under Section 564(b)(1) of the Act, 21 U.S.C. section 360bbb-3(b)(1), unless the authorization is terminated or revoked.     Resp Syncytial Virus by PCR NEGATIVE NEGATIVE Final    Comment: (NOTE) Fact Sheet for Patients: EntrepreneurPulse.com.au  Fact Sheet for Healthcare Providers: IncredibleEmployment.be  This test is not yet approved or cleared by the [place] FDA and has been authorized for detection and/or diagnosis of SARS-CoV-2 by FDA under an Emergency Use Authorization (EUA). This EUA will remain in effect (meaning this test can be used) for the duration of the COVID-19 declaration under Section 564(b)(1) of the Act, 21 U.S.C. section 360bbb-3(b)(1), unless the authorization is terminated or revoked.  Performed at [facility], [place] [address], [place], [place] [address]      Radiology Studies: No results found.  Marzetta Board, MD, PhD [facility]  Between 7 am - 7 pm I am available, please contact me via Amion (for emergencies) or Securechat (non urgent messages)  Between 7 pm - 7 am I am not available, please contact night coverage  MD/APP via Amion

## 2022-11-08 NOTE — Progress Notes (Signed)
       CROSS COVER NOTE  NAME: BAKER YAUCH MRN: [id number] DOB : [birth year]    Date of Service   11/08/2022     HPI/Events of Note   Notified by nursing that patient is agitated and restless.  No neuro deficits reported.  However, patient is not following commands despite nursing staff's multiple attempts at redirecting the patient.   Bedside RN has concerns for level of agitation and safety concern as patient is also attempting to get out of bed multiple times.  High fall risk.  Per RN, family is on the way to assist with behavior management.  Will trial low-dose IV Haldol for agitation   Interventions/ Plan   IV Haldol-  Delirium precautions       Raenette Rover, DNP, [place]

## 2022-11-08 NOTE — Progress Notes (Signed)
Radiation Oncology         [phone]) [phone] ________________________________  Name: Sheila Jacobs        MRN: [id number]  Date of Service: 11/08/2022 DOB: 1951-09-05  [id number], [name], NP  Caren Griffins, MD     Initial Inpatient Consultation   REFERRING PHYSICIAN: Caren Griffins, MD  DIAGNOSIS: The encounter diagnosis was Cancer of left breast metastatic to brain [facility]).  Metastatic left breast cancer with extensive widespread metastatic disease (osseous, nodal, hepatic, pulmonary, intracranial, and peritoneal metastases)   Stage IA T1cN0M0 left breast invasive ductal carcinoma ER / PR + HER2 neg, Grade 3 - Diagnosed in 2017: s/p: breast conserving surgery, radiation therapy, and anti-estrogen oral therapy   HISTORY OF PRESENT ILLNESS: Sheila Jacobs is a 72 y.o. female seen at the request of Dr. Cruzita Lederer. The patient was originally diagnosed with stage 1A left breast cancer in May of 2017, s/p: breast conserving surgery, radiation therapy, and anti-estrogen oral therapy consisting of letrozole.   In December 2022, the patient presented to the ED with chest pain. CTA of the chest performed revealed innumerable bilateral pulmonary nodules. Dr. Lindi Adie arranged for a PET scan on 10/05/21 which redemonstrated numerous bilateral pulmonary nodules with features concerning for metastatic disease, and a hypermetabolic left axillary node, compatible with nodal recurrence/metastasis.   The patient was accordingly referred to Dr. Valeta Harms who recommended a super D chest CT prior to tissue sampling. Subsequent chest CT on 10/31/21 demonstrated a slight interval improvement in the left axillary adenopathy and pulmonary metastases. However, CT showed new T12 and left fifth rib metastases.   FNA of the RML and RUL collected on 11/14/21 showed malignant cells present consistent with metastatic breast carcinoma; Her2 negative.   MRI of the brain on 11/29/21 also showed no evidence definitive evidence of  intracranial metastatic disease. However, a tiny focus of possible enhancement in the right frontal white matter was appreciated, favoring a benign etiology. MRI also showed an incompletely imaged focus of T1 hypointensity in the right eccentric C2 vertebral body. Although this was likely  degenerative in etiology, osseous metastatic disease could not be excluded  Follow-up MRI of the brain and CT of the head on 02/09/22 showed no evidence of intracranial metastatic disease. However, MRI showed an indeterminate 6 mm T1 hypointense lesion within the C3 vertebral body.   Dr. Lindi Adie accordingly recommended switching from letrozole to [facility]. Per multiple provider notes, the patient was noted to be in denial about her diagnosis. That being said, she did not initiate treatment and she was lost to follow-up with Dr. Lindi Adie until recently.    In recent history, the patient presented to the ED on 10/10/22 with c/o worsening nausea with emesis x 3 weeks. Initial work-up including labs were remarkable and the patient was given Zofran and instructed to follow-up with her PCP. CT AP performed on 10/11/22 showed evidence of untreated widespread pulmonary and osseous metastatic disease, and well as new mild hepatic metastatic disease. CT also revealed a new left posterior pararenal nodule measuring 11 mm, presumed to reflect soft tissue metastasis. CT of the head also performed showed no intracranial abnormalities.   The patient and her daughter returned to Dr. Lindi Adie for follow-up on 10/16/22. During this visit, the patient was noted to be extremely weak. She did not really talk at all during this visit and appeared to be falling asleep thorough out the whole appointment. Dr. Lindi Adie informed the patient of the severity of her condition and urged  her to consider treatment. Per encounter notes. The patient finally began Verzenio on the date of this visit.   Current Admission:   The patient presented to the ED and was  admitted on 11/05/22 with chest pain/failure to thrive. Per encounter notes, the patient was unable to provide any additional history on arrival.   Pertinent imaging performed while inpatient includes the following:  -- CT of the chest with contrast performed on 02/26 shows an interval increase in size of various bilateral pulmonary nodules along with new pulmonary nodules compared to imaging performed on 10/31/22. CT also shows: new and enlarged extensive multifocal osseous metastases including a 5 mm sclerotic focus in the left humeral head (new from 10/31/2021), and a sclerotic lesion involving the right lateral aspect of T12 measuring 4.9 x 2.8 cm, previously 3.8 x 2.6 cm; progression of hepatic metastatic disease; an increase in multi station mediastinal and left supraclavicular lymphadenopathy; and new scattered subcutaneous soft tissue nodules along the posterior left thoracic region concerning for metastatic disease.  -- CT AP / CTA of the chest on 02/26 shows evidence pulmonary hypertension, mild metastatic mediastinal, supraclavicular and right axillary nodal metastasis, extensive bilateral multifocal pulmonary metastasis, stable bilateral adrenal metastasis, a small peritoneal nodule in the left lower quadrant suggestive of peritoneal metastasis, and interval stability of hepatic and osseous metastatic disease. CTA otherwise showed no evidence of PE.   -- Most notably, the patient has an MRI of the brain performed yesterday which shows: multifocal dural thickening and enhancement within the supratentorial compartment compatible with dural-based metastatic disease; foci of parenchymal edema within the left frontal lobe and anterior right temporal lobe (underlying sites of dural-based tumor); contiguous tumor extension from the right temporal region into the right foramen ovale and right masticator space; curvilinear enhancement along portions of the anterior left frontal lobe suspicious for  leptomeningeal metastatic disease; at least 30 parenchymal metastases within the supratentorial brain and cerebellum; several subcentimeter foci of diffusion-weighted signal abnormalities within the posterior left frontal lobe, right parietal lobe and right occipital lobe which may reflect poorly enhancing metastases vs small acute/early subacute embolic infarcts; and multifocal osseous metastatic disease within the calvarium, skull base and visualized upper cervical spine.   The patient was seen by Dr. Lindi Adie at bedside yesterday and reviewed MRI results with her. In light of of her widespread disease progression and overall poor prognosis, Dr. Lindi Adie has recommended a referral to hospice (in addition to palliative radiation therapy). She also began xgeva yesterday.   The patient was seen at bedside today. Her grandson was present in the room.  He states she was doing fine when she saw Dr. Lindi Adie last on 10/31/22. Since then she had stopped eating, had increasing fatigue and shortness of breath, and became less alert. He states that she is doing a lot better since admission and is acting like herself again. Patient is very hard of hearing. We had our discussion via paper, so she could understand.    PREVIOUS RADIATION THERAPY: Yes - Dr. Isidore Moos  Diagnosis: Stage IA T1cN0M0 left breast invasive ductal carcinoma ER / PR + HER2 neg, Grade 3 Indication for treatment: curative      Radiation treatment dates:   06-18-16 to 07-30-16 Site/dose:    1) Left Breast / 45 Gy in 25 fractions 2) Left Breast Boost /  6 Gy in 3 fractions Beams/energy:  1) prone tangents, 3D conformal  / 10 MV 2) 3 field photons / 10  and 6MV   PAST  MEDICAL HISTORY:  Past Medical History:  Diagnosis Date   Acute saddle pulmonary embolism without acute cor pulmonale (HCC) 12/29/2017   Anginal pain (HCC)    No angiographic evidence of CAD by cath 10/21/14   Arthritis    Breast cancer of upper-outer quadrant of left female breast  ([place]) 01/31/2016   Colon polyps    GERD (gastroesophageal reflux disease)    Hearing deficit    Pt reports wearing hearing aides   Hearing loss    bilateral   History of radiation therapy 10/9-11/20/17   Left Breast 45 Gy in 25 fractions, Left Breast Boost 6 Gy in 3 fractions.    Hyperlipidemia    Hypertension    Morbid obesity (HCC)    Nausea and vomiting 11/05/2022   OSA (obstructive sleep apnea) 01/23/2018   uses cpap   Palpitations 10/16/2016   Personal history of noncompliance with medical treatment, presenting hazards to health 08/27/2016   Personal history of radiation therapy    Thyroid goiter        PAST SURGICAL HISTORY: Past Surgical History:  Procedure Laterality Date   BREAST BIOPSY Left 01/2016   BREAST LUMPECTOMY Left 04/2016   BREAST LUMPECTOMY WITH RADIOACTIVE SEED AND SENTINEL LYMPH NODE BIOPSY Left 04/11/2016   Procedure: BREAST LUMPECTOMY WITH RADIOACTIVE SEED AND SENTINEL LYMPH NODE BIOPSY;  Surgeon: Excell Seltzer, MD;  Location: [place];  Service: General;  Laterality: Left;   BRONCHIAL BIOPSY  11/14/2021   Procedure: BRONCHIAL BIOPSIES;  Surgeon: Garner Nash, DO;  Location: [place] [facility];  Service: Pulmonary;;   BRONCHIAL BRUSHINGS  11/14/2021   Procedure: BRONCHIAL BRUSHINGS;  Surgeon: Garner Nash, DO;  Location: [place];  Service: Pulmonary;;   BRONCHIAL NEEDLE ASPIRATION BIOPSY  11/14/2021   Procedure: BRONCHIAL NEEDLE ASPIRATION BIOPSIES;  Surgeon: Garner Nash, DO;  Location: [place];  Service: Pulmonary;;   COLONOSCOPY WITH PROPOFOL N/A 10/20/2015   Procedure: COLONOSCOPY WITH PROPOFOL;  Surgeon: Mauri Pole, MD;  Location: [facility];  Service: Endoscopy;  Laterality: N/A;   ESOPHAGOGASTRODUODENOSCOPY (EGD) WITH PROPOFOL N/A 10/20/2015   Procedure: ESOPHAGOGASTRODUODENOSCOPY (EGD) WITH PROPOFOL;  Surgeon: Mauri Pole, MD;  Location: [facility];  Service: Endoscopy;  Laterality: N/A;   IR ANGIOGRAM PULMONARY BILATERAL  SELECTIVE  12/29/2017   IR ANGIOGRAM SELECTIVE EACH ADDITIONAL VESSEL  12/29/2017   IR ANGIOGRAM SELECTIVE EACH ADDITIONAL VESSEL  12/29/2017   IR INFUSION THROMBOL ARTERIAL INITIAL (MS)  12/29/2017   IR INFUSION THROMBOL ARTERIAL INITIAL (MS)  12/29/2017   IR THROMB F/U EVAL ART/VEN FINAL DAY (MS)  12/30/2017   IR US GUIDE VASC ACCESS RIGHT  12/29/2017   LEFT HEART CATHETERIZATION WITH CORONARY ANGIOGRAM N/A 10/21/2014   Procedure: LEFT HEART CATHETERIZATION WITH CORONARY ANGIOGRAM;  Surgeon: Burnell Blanks, MD;  Location: [facility];  Service: Cardiovascular;  Laterality: N/A;   THYROIDECTOMY N/A 02/26/2017   Procedure: TOTAL THYROIDECTOMY;  Surgeon: Excell Seltzer, MD;  Location: [facility];  Service: General;  Laterality: N/A;   TUBAL LIGATION     VIDEO BRONCHOSCOPY WITH RADIAL ENDOBRONCHIAL ULTRASOUND  11/14/2021   Procedure: RADIAL ENDOBRONCHIAL ULTRASOUND;  Surgeon: Garner Nash, DO;  Location: [facility];  Service: Pulmonary;;     FAMILY HISTORY:  Family History  Problem Relation Age of Onset   Hypertension Mother    Heart disease Mother        Pacemaker   Kidney disease Mother    Cataracts Mother    Hearing loss Mother  Diabetes Mother    Liver disease Mother    Diabetes Sister    Cataracts Brother    Hearing loss Brother    Colon cancer Neg Hx    Thyroid disease Neg Hx      SOCIAL HISTORY:  reports that she quit smoking about 8 years ago. Her smoking use included cigarettes. She has a 45.00 pack-year smoking history. She has never used smokeless tobacco. She reports that she does not drink alcohol and does not use drugs.   ALLERGIES: Cortisone, Effexor [venlafaxine], Lexapro [escitalopram], and Xarelto [rivaroxaban]   MEDICATIONS:  No current facility-administered medications for this encounter.   No current outpatient medications on file.   Facility-Administered Medications Ordered in Other Encounters  Medication Dose Route Frequency Provider Last Rate  Last Admin   0.9 % NaCl with KCl 20 mEq/ L  infusion   Intravenous Continuous Regalado, Belkys A, MD 75 mL/hr at 11/08/22 1524 New Bag at 11/08/22 1524   acetaminophen (TYLENOL) tablet 650 mg  650 mg Oral Q6H PRN Quintella Baton, MD   650 mg at 11/06/22 1712   Or   acetaminophen (TYLENOL) suppository 650 mg  650 mg Rectal Q6H PRN Quintella Baton, MD       apixaban (ELIQUIS) tablet 5 mg  5 mg Oral BID Regalado, Belkys A, MD   5 mg at 11/08/22 1056   dexamethasone (DECADRON) 4 mg in sodium chloride 0.9 % 50 mL IVPB  4 mg Intravenous Q8H Caren Griffins, MD 202 mL/hr at 11/08/22 1529 4 mg at 11/08/22 1529   famotidine (PEPCID) IVPB 20 mg premix  20 mg Intravenous Q12H Crosley, Debby, MD 100 mL/hr at 11/08/22 1101 20 mg at 11/08/22 1101   feeding supplement (ENSURE ENLIVE / ENSURE PLUS) liquid 237 mL  237 mL Oral BID BM Regalado, Belkys A, MD   237 mL at 11/08/22 1530   gadobutrol (GADAVIST) 1 MMOL/ML injection 10 mL  10 mL Intravenous Once PRN Regalado, Belkys A, MD       ipratropium-albuterol (DUONEB) 0.5-2.5 (3) MG/3ML nebulizer solution 3 mL  3 mL Nebulization Q6H PRN Caren Griffins, MD       levothyroxine (SYNTHROID) tablet 100 mcg  100 mcg Oral QAC breakfast Regalado, Belkys A, MD   100 mcg at 11/08/22 0508   metoprolol succinate (TOPROL-XL) 24 hr tablet 50 mg  50 mg Oral Daily Regalado, Belkys A, MD   50 mg at 11/08/22 1056   ondansetron (ZOFRAN) tablet 4 mg  4 mg Oral Q8H PRN Caren Griffins, MD       Or   ondansetron (ZOFRAN) injection 4 mg  4 mg Intravenous Q8H PRN Caren Griffins, MD   4 mg at 11/08/22 1728   pantoprazole (PROTONIX) EC tablet 40 mg  40 mg Oral Daily Regalado, Belkys A, MD   40 mg at 11/08/22 1056   polyethylene glycol (MIRALAX / GLYCOLAX) packet 17 g  17 g Oral BID Regalado, Belkys A, MD   17 g at 11/08/22 1056   senna-docusate (Senokot-S) tablet 1 tablet  1 tablet Oral QHS PRN Crosley, Debby, MD         REVIEW OF SYSTEMS: As per HPI     PHYSICAL EXAM:  Wt  Readings from Last 3 Encounters:  11/05/22 222 lb 0.1 oz (100.7 kg)  10/31/22 222 lb (100.7 kg)  10/11/22 246 lb (111.6 kg)   Temp Readings from Last 3 Encounters:  11/08/22 98 F (36.7 C)  10/31/22 97.7 F (  36.5 C) (Tympanic)  10/16/22 (!) 97.5 F (36.4 C) (Temporal)   BP Readings from Last 3 Encounters:  11/08/22 117/63  10/31/22 110/76  10/16/22 115/74   Pulse Readings from Last 3 Encounters:  11/08/22 81  10/31/22 (!) 107  10/16/22 99    /10  In general this is a well appearing female in no acute distress. She's alert and oriented x4 and appropriate throughout the examination. Cardiopulmonary assessment is negative for acute distress and she exhibits normal effort.     ECOG = 3  0 - Asymptomatic (Fully active, able to carry on all predisease activities without restriction)  1 - Symptomatic but completely ambulatory (Restricted in physically strenuous activity but ambulatory and able to carry out work of a light or sedentary nature. For example, light housework, office work)  2 - Symptomatic, <50% in bed during the day (Ambulatory and capable of all self care but unable to carry out any work activities. Up and about more than 50% of waking hours)  3 - Symptomatic, >50% in bed, but not bedbound (Capable of only limited self-care, confined to bed or chair 50% or more of waking hours)  4 - Bedbound (Completely disabled. Cannot carry on any self-care. Totally confined to bed or chair)  5 - Death   Eustace Pen MM, Creech RH, Tormey DC, et al. [phone]). "Toxicity and response criteria of the [facility]". Sentinel Oncol. 5 (6): 649-55    LABORATORY DATA:  Lab Results  Component Value Date   WBC 2.1 (L) 11/08/2022   HGB 10.8 (L) 11/08/2022   HCT 30.9 (L) 11/08/2022   MCV 97.5 11/08/2022   PLT 148 (L) 11/08/2022   Lab Results  Component Value Date   NA 138 11/08/2022   K 4.1 11/08/2022   CL 111 11/08/2022   CO2 19 (L) 11/08/2022   Lab  Results  Component Value Date   ALT 17 11/08/2022   AST 34 11/08/2022   ALKPHOS 266 (H) 11/08/2022   BILITOT 1.3 (H) 11/08/2022      RADIOGRAPHY: MR BRAIN W WO CONTRAST  Result Date: 11/07/2022 CLINICAL DATA:  Provided history: Altered mental status, nontraumatic. Acute metabolic encephalopathy. Rule out metastases. Additional history obtained from the [place] breast cancer. EXAM: MRI HEAD WITHOUT AND WITH CONTRAST TECHNIQUE: Multiplanar, multiecho pulse sequences of the brain and surrounding structures were obtained without and with intravenous contrast. CONTRAST:  61m GADAVIST GADOBUTROL 1 MMOL/ML IV SOLN COMPARISON:  Head CT 10/11/2022. Brain MRI 02/09/2022. FINDINGS: Mild intermittent motion degradation. Brain: No age advanced or lobar predominant parenchymal atrophy. There is multifocal abnormal dural thickening and enhancement compatible with dural metastatic disease, most notably as follows. Dural thickening and enhancement overlying the left frontal lobe, measuring up to 6 mm in thickness (series 11, image 27). Foci of underlying mild parenchymal edema within the left frontal lobe (for instance as seen on series 6, images 24 and 18). Additionally, there is curvilinear abnormal enhancement overlying portions of the anterior left frontal lobe suspicious for leptomeningeal metastatic disease (for instance as seen on series 10, image 26 (series 10, image 34). Dural thickening and enhancement overlying the anteroinferior right frontal lobe, measuring up to 3 mm in thickness (for instance as seen on series 10, image 20). Subtle dural thickening and enhancement along the right aspect of the mid-to-posterior falx (for instance on the right on series 10, image 45) (for instance on the left on series 10, image 37). Dural thickening and enhancement  along the anterior aspect of the right temporal lobe, measuring up to 3 mm in thickness (for instance as seen on series 10, image  13). Contiguous abnormal enhancement compatible with metastatic tumor extends through right foramen ovale and into the right masticator space (series 351, image 36). Underlying parenchymal edema within the anterior right temporal lobe. 4 mm focus of dural thickening and enhancement along the mid-to-anterior right frontal lobe (series 10, image 37). There are numerous small enhancing parenchymal metastases within the supratentorial brain and cerebellum (at least 30 in number) (see annotations on series 10, image 26). The largest parenchymal lesion is located within the anterior left frontal lobe, measuring 5 mm (series 10, image 23). No more than mild edema associated with these lesions. Background mild multifocal T2 FLAIR hyperintense signal abnormality within the cerebral white matter, nonspecific but compatible with chronic small vessel ischemic disease. Small chronic cortical infarcts within the bilateral parietal lobes. Chronic microhemorrhage within the anterior left frontal lobe. Multiple small chronic infarcts in the bilateral cerebellar hemispheres. The are several subcentimeter foci of diffusion-weighted signal abnormality within the posterior left frontal lobe, right parietal lobe and right occipital lobe. These may reflect poorly enhancing metastases or small acute/early subacute embolic infarcts. No extra-axial fluid collection. No midline shift. Vascular: Maintained flow voids within the proximal large arterial vessels. Skull and upper cervical spine: Multifocal signal abnormality and abnormal enhancement within the calvarium, skull base and visualized upper cervical spine compatible with osseous metastatic disease. Sinuses/Orbits: No mass or acute finding within the imaged orbits. Prior bilateral ocular lens replacement. T2 hyperintense opacification of the frontal sinuses, and of multiple anterior ethmoid air cells, bilaterally. Other: Bilateral mastoid effusions. These results will be called to the  ordering clinician or representative by the Radiologist Assistant, and communication documented in the PACS or [employer]. IMPRESSION: 1. Multifocal dural thickening and enhancement within the supratentorial compartment compatible with dural-based metastatic disease. There are foci of parenchymal edema within the left frontal lobe and anterior right temporal lobe (underlying sites of dural-based tumor). Additionally, there is contiguous tumor extension from the right temporal region into the right foramen ovale and right masticator space. 2. Curvilinear enhancement along portions of the anterior left frontal lobe suspicious for leptomeningeal metastatic disease. 3. Numerous (at least 30) parenchymal metastases within the supratentorial brain and cerebellum. 4. The are several subcentimeter foci of diffusion-weighted signal abnormality within the posterior left frontal lobe, right parietal lobe and right occipital lobe. These may reflect poorly enhancing metastases or small acute/early subacute embolic infarcts. 5. Multifocal osseous metastatic disease within the calvarium, skull base and visualized upper cervical spine. 6. Background parenchymal atrophy, chronic small-vessel ischemic disease and chronic infarcts. 7. Paranasal sinus disease, as described. 8. Bilateral mastoid effusions. Electronically Signed   By: Kellie Simmering D.O.   On: 11/07/2022 11:39   CT ABDOMEN PELVIS W CONTRAST  Result Date: 11/05/2022 CLINICAL DATA:  Abdominal pain. Acute abdominal pain nonlocalized. Stage IV breast cancer with lung metastasis. * Tracking Code: BO *. EXAM: CT ANGIOGRAPHY CHEST CT ABDOMEN AND PELVIS WITH CONTRAST TECHNIQUE: Multidetector CT imaging of the chest was performed using the standard protocol during bolus administration of intravenous contrast. Multiplanar CT image reconstructions and MIPs were obtained to evaluate the vascular anatomy. Multidetector CT imaging of the abdomen and pelvis was performed using  the standard protocol during bolus administration of intravenous contrast. RADIATION DOSE REDUCTION: This exam was performed according to the departmental dose-optimization program which includes automated exposure control, adjustment of the mA  and/or kV according to patient size and/or use of iterative reconstruction technique. CONTRAST:  12m OMNIPAQUE IOHEXOL 350 MG/ML SOLN COMPARISON:  CT chest 11/05/2022, CT abdomen pelvis 10/11/2022 FINDINGS: CTA CHEST FINDINGS Cardiovascular: No filling defects within the pulmonary arteries to suggest acute pulmonary embolism. Pulmonary artery is dilated 4.6 cm. Mediastinum/Nodes: Enlarged supraclavicular nodes. RIGHT hilar mild adenopathy. RIGHT hilar node measures 11 mm. Mild subcarinal paratracheal adenopathy. No change from recent exam Lungs/Pleura: Innumerable bilateral pulmonary nodules of varying size scattered throughout the LEFT and RIGHT lung. No change from recent exam same day. No pulmonary infarction. Musculoskeletal: Multifocal sclerotic skeletal metastasis again noted. Review of the MIP images confirms the above findings. CT ABDOMEN and PELVIS FINDINGS Hepatobiliary: Large hepatic metastasis in the RIGHT hepatic lobe measuring 23 mm (image 9/3). Smaller metastatic lesion along the falciform ligament measuring 8 mm on image 17/3). Gallbladder distended. Pancreas: Pancreas is normal. No ductal dilatation. No pancreatic inflammation. Spleen: Normal spleen Adrenals/urinary tract: Bilateral enlargement of the adrenal glands consistent metastatic disease. Findings similar to comparison CT No ureteral obstruction.  Bladder normal. Stomach/Bowel: Stomach, small bowel, appendix, and cecum are normal. The colon and rectosigmoid colon are normal. Vascular/Lymphatic: Abdominal aorta is normal caliber with atherosclerotic calcification. There is no retroperitoneal or periportal lymphadenopathy. No pelvic lymphadenopathy. Reproductive: Uterus and adnexa unremarkable. Uterus  has a lobular contour consistent leiomyoma. Other: Is rounded nodule in the ventral LEFT peritoneal space (image 71/3 no change Musculoskeletal: Multifocal sclerotic skeletal metastasis no change Review of the MIP images confirms the above findings. IMPRESSION: Chest Impression: 1. No evidence acute pulmonary embolism. 2. Extensive bilateral multifocal pulmonary metastasis. 3. Enlargement of the pulmonary suggest pulmonary hypertension. 4. Mild metastatic mediastinal, supraclavicular and RIGHT axillary nodal metastasis Abdomen / Pelvis Impression: 1. hepatic metastasis stable 2. Bilateral adrenal metastasis.  No change 3. Small peritoneal nodule in the LEFT lower quadrant suggests peritoneal metastasis. 4. Widespread sclerotic skeletal metastasis unchanged. 5. No acute findings in the abdomen pelvis. Electronically Signed   By: SSuzy BouchardM.D.   On: 11/05/2022 18:15   CT Angio Chest Pulmonary Embolism (PE) W or WO Contrast  Result Date: 11/05/2022 CLINICAL DATA:  Abdominal pain. Acute abdominal pain nonlocalized. Stage IV breast cancer with lung metastasis. * Tracking Code: BO *. EXAM: CT ANGIOGRAPHY CHEST CT ABDOMEN AND PELVIS WITH CONTRAST TECHNIQUE: Multidetector CT imaging of the chest was performed using the standard protocol during bolus administration of intravenous contrast. Multiplanar CT image reconstructions and MIPs were obtained to evaluate the vascular anatomy. Multidetector CT imaging of the abdomen and pelvis was performed using the standard protocol during bolus administration of intravenous contrast. RADIATION DOSE REDUCTION: This exam was performed according to the departmental dose-optimization program which includes automated exposure control, adjustment of the mA and/or kV according to patient size and/or use of iterative reconstruction technique. CONTRAST:  827mOMNIPAQUE IOHEXOL 350 MG/ML SOLN COMPARISON:  CT chest 11/05/2022, CT abdomen pelvis 10/11/2022 FINDINGS: CTA CHEST  FINDINGS Cardiovascular: No filling defects within the pulmonary arteries to suggest acute pulmonary embolism. Pulmonary artery is dilated 4.6 cm. Mediastinum/Nodes: Enlarged supraclavicular nodes. RIGHT hilar mild adenopathy. RIGHT hilar node measures 11 mm. Mild subcarinal paratracheal adenopathy. No change from recent exam Lungs/Pleura: Innumerable bilateral pulmonary nodules of varying size scattered throughout the LEFT and RIGHT lung. No change from recent exam same day. No pulmonary infarction. Musculoskeletal: Multifocal sclerotic skeletal metastasis again noted. Review of the MIP images confirms the above findings. CT ABDOMEN and PELVIS FINDINGS Hepatobiliary: Large hepatic metastasis in the  RIGHT hepatic lobe measuring 23 mm (image 9/3). Smaller metastatic lesion along the falciform ligament measuring 8 mm on image 17/3). Gallbladder distended. Pancreas: Pancreas is normal. No ductal dilatation. No pancreatic inflammation. Spleen: Normal spleen Adrenals/urinary tract: Bilateral enlargement of the adrenal glands consistent metastatic disease. Findings similar to comparison CT No ureteral obstruction.  Bladder normal. Stomach/Bowel: Stomach, small bowel, appendix, and cecum are normal. The colon and rectosigmoid colon are normal. Vascular/Lymphatic: Abdominal aorta is normal caliber with atherosclerotic calcification. There is no retroperitoneal or periportal lymphadenopathy. No pelvic lymphadenopathy. Reproductive: Uterus and adnexa unremarkable. Uterus has a lobular contour consistent leiomyoma. Other: Is rounded nodule in the ventral LEFT peritoneal space (image 71/3 no change Musculoskeletal: Multifocal sclerotic skeletal metastasis no change Review of the MIP images confirms the above findings. IMPRESSION: Chest Impression: 1. No evidence acute pulmonary embolism. 2. Extensive bilateral multifocal pulmonary metastasis. 3. Enlargement of the pulmonary suggest pulmonary hypertension. 4. Mild metastatic  mediastinal, supraclavicular and RIGHT axillary nodal metastasis Abdomen / Pelvis Impression: 1. hepatic metastasis stable 2. Bilateral adrenal metastasis.  No change 3. Small peritoneal nodule in the LEFT lower quadrant suggests peritoneal metastasis. 4. Widespread sclerotic skeletal metastasis unchanged. 5. No acute findings in the abdomen pelvis. Electronically Signed   By: Suzy Bouchard M.D.   On: 11/05/2022 18:15   CT CHEST W CONTRAST  Result Date: 11/05/2022 CLINICAL DATA:  History of stage IV breast cancer presenting with one-week history of shortness of breath and acute onset chest pain EXAM: CT CHEST WITH CONTRAST TECHNIQUE: Multidetector CT imaging of the chest was performed during intravenous contrast administration. RADIATION DOSE REDUCTION: This exam was performed according to the departmental dose-optimization program which includes automated exposure control, adjustment of the mA and/or kV according to patient size and/or use of iterative reconstruction technique. CONTRAST:  32m OMNIPAQUE IOHEXOL 350 MG/ML SOLN COMPARISON:  CT chest dated 10/31/2021, CT chest dated 10/31/2021, PET-CT dated 10/05/2018 FINDINGS: Cardiovascular: Normal heart size. No significant pericardial fluid/thickening. Pulmonary artery measures 4.5 cm, previously 4.2 cm (remeasured). No central pulmonary emboli. Coronary artery calcifications and aortic atherosclerosis. Mediastinum/Nodes: Thyroidectomy. Small hiatal hernia. Multi station lymphadenopathy, for example left supraclavicular lymph node measures 14 mm (4:15), previously 6 mm, and right precarinal lymph node measures 12 mm (4:40), previously 8 mm. Left subpectoral node measures 9 mm. Lungs/Pleura: The central airways are patent. Innumerable bilateral pulmonary nodules, new and enlarged compared to 10/31/2021. No pneumothorax. No pleural effusion. Upper abdomen: Increased size of 2.7 cm segment 4 hepatic lesion (4:72), previously 2.4 cm and posterior segment 6  lesion measuring 1.6 cm (4:98), previously 0.9 cm. Bilateral adrenal nodules, unchanged, 1.7 cm on the right and 1.6 cm on the left, likely adenomas. Musculoskeletal: New and enlarged extensive multifocal osseous metastases, for example 5 mm sclerotic focus in the left humeral head (4:7), new from 10/31/2021, and sclerotic lesion involving the right lateral aspect of T12 measuring 4.9 x 2.8 cm, previously 3.8 x 2.6 cm. Partially imaged breast demonstrates spiculated nodule in the left breast containing multiple metallic clips. Scattered subcutaneous soft tissue nodules along the posterior left thoracic region, for example measuring 7 mm (4:56), new compared to 10/31/2021. IMPRESSION: 1. No central pulmonary emboli. 2. Enlarged main pulmonary artery, can be seen in the setting of pulmonary hypertension. 3. Innumerable bilateral pulmonary nodules, new and enlarged compared to 10/31/2021, consistent with metastatic disease. 4. New and enlarged extensive multifocal osseous metastases. 5. Increased size of hepatic metastases. 6. Increased multi station mediastinal and left supraclavicular lymphadenopathy. 7. Scattered  subcutaneous soft tissue nodules along the posterior left thoracic region, new compared to 10/31/2021, suspicious for metastatic disease. 8.  Aortic Atherosclerosis (ICD10-I70.0). Electronically Signed   By: Darrin Nipper M.D.   On: 11/05/2022 17:07   DG Chest 2 View  Result Date: 11/05/2022 CLINICAL DATA:  Chest pain. Shortness of breath x1 week with chest pain x1 day. History of stage IV breast cancer. EXAM: CHEST - 2 VIEW COMPARISON:  Chest radiograph 11/14/2021.  Chest CT 10/31/2021. FINDINGS: Interval increase in diffuse nodular opacities throughout both lungs, may reflect worsening metastatic disease versus superimposed infection or edema. Stable cardiac and mediastinal contours. Possible small left pleural effusion. No pneumothorax. IMPRESSION: Interval increase in diffuse nodular opacities  throughout both lungs, may reflect worsening metastatic disease versus superimposed infection or edema. Electronically Signed   By: Emmit Alexanders M.D.   On: 11/05/2022 11:59   CT ABDOMEN PELVIS W CONTRAST  Result Date: 10/11/2022 CLINICAL DATA:  Two weeks of abdominal pain with nausea and vomiting, bowel obstruction suspected. History of breast cancer EXAM: CT ABDOMEN AND PELVIS WITH CONTRAST TECHNIQUE: Multidetector CT imaging of the abdomen and pelvis was performed using the standard protocol following bolus administration of intravenous contrast. RADIATION DOSE REDUCTION: This exam was performed according to the departmental dose-optimization program which includes automated exposure control, adjustment of the mA and/or kV according to patient size and/or use of iterative reconstruction technique. CONTRAST:  60m OMNIPAQUE IOHEXOL 350 MG/ML SOLN COMPARISON:  10/05/2021 PET-CT FINDINGS: Lower chest: Innumerable pulmonary nodules consistent with metastatic disease, progressed from comparison. Atherosclerosis of the coronaries. Small sliding hiatal hernia. Hepatobiliary: Hypoenhancing mass in the upper right lobe measuring 2.4 cm, consistent with metastatic disease.Full gallbladder without calcified stone or acute inflammation. Pancreas: Unremarkable. Spleen: Unremarkable. Adrenals/Urinary Tract: Bilateral adrenal nodules present since at least 2019 CT without hypermetabolism on interval PET, most consistent with adenoma measuring up to 17 mm on the right. No hydronephrosis or stone. Enhancing nodule in the left posterior pararenal space measuring 11 mm, new from prior PET CT and likely a retroperitoneal metastasis. Patchy right renal cortical scarring. Unremarkable bladder. Stomach/Bowel:  No obstruction. No visible inflammation. Vascular/Lymphatic: No acute vascular abnormality. No mass or adenopathy. Reproductive:Fibroids of various density.  No acute finding Other: No ascites or pneumoperitoneum.  Musculoskeletal: Extensive sclerotic metastatic disease seen throughout the covered spine and pelvis. Largest deposit in the T12 right eccentric vertebra. No gross compression fracture or extraosseous tumor growth. IMPRESSION: 1. No acute finding.  No bowel obstruction or visible inflammation. 2. Widespread pulmonary and osseous metastatic disease. Milder hepatic metastatic disease. 3. Left posterior pararenal nodule since PET CT 1 year ago, presumed soft tissue metastasis. Electronically Signed   By: JJorje GuildM.D.   On: 10/11/2022 05:28   CT HEAD WO CONTRAST (5MM)  Result Date: 10/11/2022 CLINICAL DATA:  Abdominal pain with nausea and vomiting for 2 weeks. Evaluate for elevated intracranial pressure EXAM: CT HEAD WITHOUT CONTRAST TECHNIQUE: Contiguous axial images were obtained from the base of the skull through the vertex without intravenous contrast. RADIATION DOSE REDUCTION: This exam was performed according to the departmental dose-optimization program which includes automated exposure control, adjustment of the mA and/or kV according to patient size and/or use of iterative reconstruction technique. COMPARISON:  02/09/2022 head CT and brain MRI FINDINGS: Brain: No evidence of acute infarction, hemorrhage, hydrocephalus, extra-axial collection or mass lesion/mass effect. Small remote left cerebellar infarct. Vascular: No hyperdense vessel or unexpected calcification. Skull: Normal. Negative for fracture or focal lesion. Sinuses/Orbits: Bifrontal  sinus opacification since prior. Negative orbits. IMPRESSION: 1. No acute or interval intracranial finding. No hydrocephalus or intracranial mass. 2. Bifrontal sinus opacification since June 2023. Electronically Signed   By: Jorje Guild M.D.   On: 10/11/2022 05:19       IMPRESSION/PLAN: Metastatic left breast cancer with extensive widespread metastatic disease (osseous, nodal, hepatic, pulmonary, intracranial, and peritoneal metastases)   Stage IA  T1cN0M0 left breast invasive ductal carcinoma ER / PR + HER2 neg, Grade 3 - Diagnosed in 2017: s/p: breast conserving surgery, radiation therapy, and anti-estrogen oral therapy  Today we discussed the workup and natural course of metastatic breast cancer with widespread disease to the brain, highlighting the role of palliative radiotherapy in the management. Most recent imaging indicates numerous (at least 30) metastases within the supratentorial brain and cerebellum. She is a candidate for palliative radiotherapy to decrease her risk of worsening symptoms and further disease spread. Accordingly, I recommend 30 Gy in 10 fractions to her whole brain. We discussed the available radiation techniques, and focused on the details and logistics of delivery. We discussed and outlined the risks, benefits, short and long-term effects associated with radiotherapy. I also talked to his daughter via phone today to share the discussion I had with her mother and grandson. They were encouraged to ask questions that were answered to their stated satisfaction. Patient and her daughter are both willing to proceed with palliative radiation therapy to her whole brain at this time.  Yesterday evening I spoke with the daughter who has healthcare power of attorney.  We discussed the seriousness of her disease especially the bulk of her systemic disease.  I discussed that she would be a candidate for whole brain radiation therapy but doubted this would provide significant survival benefit given her extensive systemic disease.  The daughter wish to discuss with family members further before making a decision concerning treatment.  On evaluation today does wish that her mother proceed with radiation treatments to the brain.   We will get patient scheduled for CT simulation early next week and plan to begin treatment at that time.  In a visit lasting 75 minutes, greater than 50% of the time was spent face to face discussing the  patient's condition, in preparation for the discussion, and coordinating the patient's care.    -----------------------------------   Leona Singleton, PA   Blair Promise, PhD, MD   **Disclaimer: This note was dictated with voice recognition software. Similar sounding words can inadvertently be transcribed and this note may contain transcription errors which may not have been corrected upon publication of note.**  This document serves as a record of services personally performed by Dene Gentry PA-C. It was created on her behalf by Roney Mans, a trained medical scribe. The creation of this record is based on the scribe's personal observations and the provider's statements to them. This document has been checked and approved by the attending provider.

## 2022-11-08 NOTE — Progress Notes (Signed)
Daily Progress Note   Patient Name: Sheila Jacobs       Date: 11/08/2022 DOB: March 24, 1951  Age: 72 y.o. MRN#: [id number] Attending Physician: Caren Griffins, MD Primary Care Physician: Arthur Holms, NP Admit Date: 11/05/2022  Reason for Consultation/Follow-up: Establishing goals of care  Subjective: I have reviewed medical records including EPIC notes and labs. Received report from primary RN - acute concern that patient wishes to leave AMA.  Went to visit patient at bedside - no family/visitors present. Patient was lying in bed awake, alert, oriented, and able to participate in conversation via pen/paper as she is very hard of hearing. No signs or non-verbal gestures of pain or discomfort noted. No respiratory distress, increased work of breathing, or secretions noted.   Entire communication between myself and patient occurred via pen/paper during visit today.  Emotional support provided. Allowed space and time for patient to reflect on difficult news of receiving MRI results today. Discussed that Oncology has now recommended hospice care and are exploring option of palliative radiation. She understands that the disease progression is a poor prognostic indicator. Detailed education provided on the goals of palliative radiation - she understands it is not meant for cure, but to manage symptoms. She tells me "radiation is a waste of time." She states "I haven't felt bad in a long time." Reviewed that this decisions would also include weighing her thoughts on possible side effects.   Provided education and counseling at length on the philosophy and benefits of hospice care. Discussed that it offers a holistic approach to care in the setting of end-stage illness, and is about supporting the patient  where they are allowing nature to take it's course.   We discussed her options going forward in detail: only palliative radiation, only hospice, both, or neither. Reviewed that she is at high risk for recurrent hospitalizations without hospice support and that hospice is recommended.  We discussed she may not qualify for hospice services while undergoing radiation; however, this is evaluated by hospice on a case by case basis - if she would want both, we would need to have hospice evaluate for eligibility.   Patient tells me her her daughter lives in [place] - she wonders if it is an option to go be with her there. Reviewed that hospice organization could be set up  in [facility]; however, if she decides to undergo radiation, it would likely be recommended radiation be completed at [facility] to avoid gaps in care as transfer to [facility] could take unknown amount of time. After discussion, patient tells me she would prefer to stay in [place]. She enjoys being around her grandkids.   Patient would like to continue discussions with her family prior to making final decisions. Informed her that Rad. Onc.is scheduled to call her daughter at 3p and that her daughter plans to arrive around 6p to review information with her - per Dr. Cruzita Lederer. She is open to receiving additional information from Rad. Onc. And making step-wise decisions pending information.   Patient is agreeable to stay and not leave AMA.  Her brother and his wife arrived to visit with patient as our meeting ended.  All questions and concerns addressed. Encouraged to call with questions and/or concerns. PMT card provided.  2:45 PM Called patient's daughter/[name] - emotional support provided. She does not have any questions or PMT needs at this time. She is prepared to speak with Rad. Onc. At 3pm and will discuss information with patient this evening.  Length of Stay: 2  Current Medications: Scheduled Meds:   apixaban  5 mg Oral BID   feeding supplement  237 mL  Oral BID BM   ipratropium-albuterol  3 mL Nebulization TID   levothyroxine  100 mcg Oral QAC breakfast   metoprolol succinate  50 mg Oral Daily   pantoprazole  40 mg Oral Daily   polyethylene glycol  17 g Oral BID    Continuous Infusions:  0.9 % NaCl with KCl 20 mEq / L 75 mL/hr at 11/07/22 2341   dexamethasone (DECADRON) IVPB ([facility]) 4 mg (11/08/22 0724)   famotidine (PEPCID) IV 20 mg (11/08/22 1101)    PRN Meds: acetaminophen **OR** acetaminophen, gadobutrol, ondansetron **OR** ondansetron (ZOFRAN) IV, senna-docusate  Physical Exam Vitals and nursing note reviewed.  Constitutional:      General: She is not in acute distress. HENT:     Right Ear: Decreased hearing noted.     Left Ear: Decreased hearing noted.  Pulmonary:     Effort: No respiratory distress.  Skin:    General: Skin is warm and dry.  Neurological:     Mental Status: She is alert and oriented to person, place, and time.  Psychiatric:        Attention and Perception: Attention normal.        Behavior: Behavior is cooperative.        Cognition and Memory: Cognition and memory normal.             Vital Signs: BP 117/63 (BP Location: Left Wrist)   Pulse 81   Temp 98 F (36.7 C)   Resp 18   Ht '5\' 3"'$  (1.6 m)   Wt 100.7 kg   SpO2 100%   BMI 39.33 kg/m  SpO2: SpO2: 100 % O2 Device: O2 Device: Room Air O2 Flow Rate: O2 Flow Rate (L/min): 2 L/min  Intake/output summary:  Intake/Output Summary (Last 24 hours) at 11/08/2022 1306 Last data filed at 11/08/2022 0500 Gross per 24 hour  Intake 240 ml  Output 950 ml  Net -710 ml   LBM: Last BM Date :  (PTA) Baseline Weight: Weight: 100.7 kg Most recent weight: Weight: 100.7 kg       Palliative Assessment/Data: PPS 50%      Patient Active Problem List   Diagnosis Date Noted   Hyponatremia 11/06/2022  Nausea and vomiting 11/05/2022   Hypokalemia 11/05/2022   FTT (failure to thrive) in adult 11/05/2022   Hyperkalemia 11/05/2022   Hearing deficit  02/09/2022   Multiple pulmonary nodules 10/19/2021   Heel callus 06/14/2021   Pes planus 06/14/2021   Lymphedema 03/07/2021   Pharyngoesophageal dysphagia 11/26/2019   Bilateral temporomandibular joint pain 03/05/2019   Presbycusis of both ears 03/05/2019   Medication management 10/22/2018   History of pulmonary embolus (PE), 12/2017 10/22/2018   Chronic anticoagulation 10/22/2018   Tinea corporis 08/22/2018   Right leg swelling 08/21/2018   Pain and swelling of ankle, right 04/24/2018   Cough 04/13/2018   Throat pain in adult 03/10/2018   OSA (obstructive sleep apnea) 01/23/2018   Hypothyroidism    Hypoxia    Acute saddle pulmonary embolism without acute cor pulmonale ([place]) 12/29/2017   History of breast cancer 10/23/2017   Post-surgical hypothyroidism 06/27/2017   Thyroid goiter 02/26/2017   Personal history of noncompliance with medical treatment, presenting hazards to health 08/27/2016   Generalized anxiety disorder 08/09/2016   Abnormal TSH 05/09/2016   Abnormality of gait 01/31/2016   Low back pain 01/31/2016   Breast cancer of upper-outer quadrant of left female breast ([place]) 01/31/2016   Former heavy cigarette smoker (20-39 per day) 11/08/2015   Esophageal reflux    GERD (gastroesophageal reflux disease) 05/03/2015   Dependent edema 05/03/2015   Morbid obesity ([place]) 04/29/2015   Right carotid bruit 10/06/2014   Atypical chest pain 09/18/2014   Hypertension 09/18/2014   Hyperlipidemia 09/18/2014   Enlarged LA (left atrium)     Palliative Care Assessment & Plan   Patient Profile: 72 y.o. female  with past medical history of HTN, HLD, GERD, goiter, OSA, prior PE, arthritis, and metastatic cancer admitted on 11/05/2022 with chest pain, nausea and vomiting.    Patient is admitted for lactic acidosis, hypokalemia, acute metabolic encephalopathy, possible angioedema of bottom life, failure to thrive. PMT has been consulted to assist with goals of care  conversation.  Assessment: Principal Problem:   Nausea and vomiting Active Problems:   Hypertension   Breast cancer of upper-outer quadrant of left female breast ([facility])   Generalized anxiety disorder   Hypothyroidism   History of pulmonary embolus (PE), 12/2017   Chronic anticoagulation   Multiple pulmonary nodules   Hypokalemia   FTT (failure to thrive) in adult   Hyperkalemia   Hyponatremia   Concern about end of life  Recommendations/Plan: Continue full code/full scope Patient would like to gain additional insight from Rad. Onc prior to making final decisions on palliative radiation and/or hospice care. She will discuss with her family  PMT will continue to follow and support holistically   Goals of Care and Additional Recommendations: Limitations on Scope of Treatment: Full Scope Treatment  Code Status:    Code Status Orders  (From admission, onward)           Start     Ordered   11/05/22 2140  Full code  Continuous       Question:  By:  Answer:  Consent: discussion documented in EHR   11/05/22 2139           Code Status History     Date Active Date Inactive Code Status Order ID Comments User Context   02/26/2017 1137 02/27/2017 1727 Full Code [id number]  Excell Seltzer, MD Inpatient   09/18/2014 0427 09/20/2014 1644 Full Code [id number]  Theressa Millard, MD Inpatient       Prognosis:  Poor  Discharge Planning: To Be Determined  Care plan was discussed with primary RN, patient, patient's daughter  Thank you for allowing the Palliative Medicine Team to assist in the care of this patient.   Total Time 90 minutes Prolonged Time Billed  yes       Greater than 50%  of this time was spent counseling and coordinating care related to the above assessment and plan.  Lin Landsman, NP  Please contact Palliative Medicine Team phone at [phone] for questions and concerns.   *Portions of this note are a verbal dictation therefore any spelling  and/or grammatical errors are due to the "[place] [employer]" system interpretation.

## 2022-11-09 DIAGNOSIS — R112 Nausea with vomiting, unspecified: Secondary | ICD-10-CM | POA: Diagnosis not present

## 2022-11-09 DIAGNOSIS — R1114 Bilious vomiting: Secondary | ICD-10-CM | POA: Diagnosis not present

## 2022-11-09 DIAGNOSIS — Z66 Do not resuscitate: Secondary | ICD-10-CM

## 2022-11-09 DIAGNOSIS — R638 Other symptoms and signs concerning food and fluid intake: Secondary | ICD-10-CM

## 2022-11-09 DIAGNOSIS — E876 Hypokalemia: Secondary | ICD-10-CM | POA: Diagnosis not present

## 2022-11-09 DIAGNOSIS — R079 Chest pain, unspecified: Secondary | ICD-10-CM | POA: Diagnosis not present

## 2022-11-09 MED ORDER — SODIUM CHLORIDE 0.9 % IV SOLN
4.0000 mg | Freq: Every day | INTRAVENOUS | Status: DC
  Administered 2022-11-10 – 2022-11-12 (×3): 4 mg via INTRAVENOUS
  Filled 2022-11-09 (×4): qty 0.4

## 2022-11-09 MED ORDER — SODIUM CHLORIDE 0.9 % IV SOLN
4.0000 mg | Freq: Two times a day (BID) | INTRAVENOUS | Status: DC
  Filled 2022-11-09: qty 0.4

## 2022-11-09 NOTE — Progress Notes (Addendum)
Daily Progress Note   Patient Name: Sheila Jacobs       Date: 11/09/2022 DOB: May 25, 1951  Age: 72 y.o. MRN#: [id number] Attending Physician: Caren Griffins, MD Primary Care Physician: Arthur Holms, NP Admit Date: 11/05/2022  Reason for Consultation/Follow-up: Establishing goals of care  Subjective: I have reviewed medical records including EPIC notes and labs. Received report from primary RN - no acute concerns. RN reports patient's mental status wax/wanes - patient was very disoriented last night. Per RN, patient also has decreased oral intake.  Went to visit patient at bedside - daughter/[name] and 3 other family members present. Patient was sitting up in chair asleep - I did not attempt to wake her to preserve comfort. No signs or non-verbal gestures of pain or discomfort noted. No respiratory distress, increased work of breathing, or secretions noted.   Emotional support provided to family. [name] confirms goal is to pursue palliative radiation - first treatment is scheduled for Monday at 3p. Discussed with [name] that due to patient's increased confusion last night, medications were adjusted today and medical team will continue to monitor. We reviewed that it is uncertain if patient will discharge prior to Monday - if she does not, informed her that patient would be transferred to [facility] from [facility]. If patient is discharged, discussed recommendation for hospice services. Reviewed that it is uncertain if patient will be accepted to hospice while receiving palliative radiation; however, could send a referral to allow them to do an evaluation as it is usually a case-by-case basis. [name] is agreeable to hospice + palliative radiation at discharge, hoping for [facility]. [name]  would also like to discuss options of residential vs home hospice - hospice liaison to discuss as I am not sure if residential hospice + palliative radiation is an option.   Encouraged family to consider DNR/DNI status understanding evidenced based poor outcomes in similar hospitalized patient, as the cause of arrest is likely associated with advanced chronic/terminal illness rather than an easily reversible acute cardio-pulmonary event.  I shared that even if we pursued resuscitation we would not able to resolve the underlying factors. I explained that DNR/DNI does not change the medical plan and it only comes into effect after a person has arrested (died).  It is a protective measure to keep [place] from harming the  patient in their last moments of life. [name] was agreeable to DNR/DNI with understanding that patient would not receive CPR, defibrillation, ACLS medications, or intubation. [name] tells me, "when it's her time, it's here time. I don't want to see her suffer more."  [name] also is concerned about patient's decreased oral intake. Reviewed this as part of the natural disease trajectory. Education provided on the philosophy of comfort feeds - offering pleasure foods/drinks.  All questions and concerns addressed. Encouraged to call with questions and/or concerns. PMT card provided.  Length of Stay: 3  Current Medications: Scheduled Meds:   apixaban  5 mg Oral BID   feeding supplement  237 mL Oral BID BM   levothyroxine  100 mcg Oral QAC breakfast   metoprolol succinate  50 mg Oral Daily   pantoprazole  40 mg Oral Daily   polyethylene glycol  17 g Oral BID    Continuous Infusions:  0.9 % NaCl with KCl 20 mEq / L Stopped (11/09/22 0956)   dexamethasone (DECADRON) IVPB ([facility])     famotidine (PEPCID) IV Stopped (11/09/22 1150)    PRN Meds: acetaminophen **OR** acetaminophen, gadobutrol, ipratropium-albuterol, ondansetron **OR** ondansetron (ZOFRAN) IV, senna-docusate  Physical  Exam Vitals and nursing note reviewed.  Constitutional:      General: She is not in acute distress.    Appearance: She is ill-appearing.  HENT:     Right Ear: Decreased hearing noted.     Left Ear: Decreased hearing noted.  Pulmonary:     Effort: No respiratory distress.  Skin:    General: Skin is warm and dry.  Neurological:     Comments: asleep             Vital Signs: BP 122/71 (BP Location: Right Arm)   Pulse 72   Temp 98 F (36.7 C) (Oral)   Resp 18   Ht '5\' 3"'$  (1.6 m)   Wt 100.7 kg   SpO2 96%   BMI 39.33 kg/m  SpO2: SpO2: 96 % O2 Device: O2 Device: Room Air O2 Flow Rate: O2 Flow Rate (L/min): 2 L/min  Intake/output summary:  Intake/Output Summary (Last 24 hours) at 11/09/2022 1526 Last data filed at 11/09/2022 1153 Gross per 24 hour  Intake 2891.54 ml  Output 800 ml  Net 2091.54 ml   LBM: Last BM Date : 11/06/22 Baseline Weight: Weight: 100.7 kg Most recent weight: Weight: 100.7 kg       Palliative Assessment/Data: PPS 20%      Patient Active Problem List   Diagnosis Date Noted   Cancer of left breast metastatic to brain ([place]) 11/08/2022   Hyponatremia 11/06/2022   Nausea and vomiting 11/05/2022   Hypokalemia 11/05/2022   FTT (failure to thrive) in adult 11/05/2022   Hyperkalemia 11/05/2022   Hearing deficit 02/09/2022   Multiple pulmonary nodules 10/19/2021   Heel callus 06/14/2021   Pes planus 06/14/2021   Lymphedema 03/07/2021   Pharyngoesophageal dysphagia 11/26/2019   Bilateral temporomandibular joint pain 03/05/2019   Presbycusis of both ears 03/05/2019   Medication management 10/22/2018   History of pulmonary embolus (PE), 12/2017 10/22/2018   Chronic anticoagulation 10/22/2018   Tinea corporis 08/22/2018   Right leg swelling 08/21/2018   Pain and swelling of ankle, right 04/24/2018   Cough 04/13/2018   Throat pain in adult 03/10/2018   OSA (obstructive sleep apnea) 01/23/2018   Hypothyroidism    Hypoxia    Acute saddle pulmonary  embolism without acute cor pulmonale ([place]) 12/29/2017   History of  breast cancer 10/23/2017   Post-surgical hypothyroidism 06/27/2017   Thyroid goiter 02/26/2017   Personal history of noncompliance with medical treatment, presenting hazards to health 08/27/2016   Generalized anxiety disorder 08/09/2016   Abnormal TSH 05/09/2016   Abnormality of gait 01/31/2016   Low back pain 01/31/2016   Breast cancer of upper-outer quadrant of left female breast ([place]) 01/31/2016   Former heavy cigarette smoker (20-39 per day) 11/08/2015   Esophageal reflux    GERD (gastroesophageal reflux disease) 05/03/2015   Dependent edema 05/03/2015   Morbid obesity ([place]) 04/29/2015   Right carotid bruit 10/06/2014   Atypical chest pain 09/18/2014   Hypertension 09/18/2014   Hyperlipidemia 09/18/2014   Enlarged LA (left atrium)     Palliative Care Assessment & Plan   Patient Profile: 72 y.o. female  with past medical history of HTN, HLD, GERD, goiter, OSA, prior PE, arthritis, and metastatic cancer admitted on 11/05/2022 with chest pain, nausea and vomiting.    Patient is admitted for lactic acidosis, hypokalemia, acute metabolic encephalopathy, possible angioedema of bottom life, failure to thrive. PMT has been consulted to assist with goals of care conversation.  Assessment: Principal Problem:   Nausea and vomiting Active Problems:   Hypertension   Breast cancer of upper-outer quadrant of left female breast ([facility])   Generalized anxiety disorder   Hypothyroidism   History of pulmonary embolus (PE), 12/2017   Chronic anticoagulation   Multiple pulmonary nodules   Hypokalemia   FTT (failure to thrive) in adult   Hyperkalemia   Hyponatremia   Concern about end of life  Recommendations/Plan: Continue to treat the treatable Now DNR/DNI - durable DNR form completed and placed in shadow chart. Copy was made and will be scanned into Vynca/ACP tab Goal is for patient to start palliative radiation Monday  3/4 Family are interested in hospice services at discharge if they accept patient receiving palliative radiation TOC notified and consulted for: referral to [facility] PMT will continue to follow and support holistically  Goals of Care and Additional Recommendations: Limitations on Scope of Treatment: Full Scope Treatment and No Tracheostomy  Code Status:    Code Status Orders  (From admission, onward)           Start     Ordered   11/05/22 2140  Full code  Continuous       Question:  By:  Answer:  Consent: discussion documented in EHR   11/05/22 2139           Code Status History     Date Active Date Inactive Code Status Order ID Comments User Context   02/26/2017 1137 02/27/2017 1727 Full Code [id number]  Excell Seltzer, MD Inpatient   09/18/2014 0427 09/20/2014 1644 Full Code [id number]  Theressa Millard, MD Inpatient       Prognosis:  Poor  Discharge Planning: Residential vs home hospice? Unsure if hospice organization will accept patient while receiving palliative radiation  Care plan was discussed with primary RN, patient's daughter  Thank you for allowing the Palliative Medicine Team to assist in the care of this patient.  Lin Landsman, NP  Please contact Palliative Medicine Team phone at [phone] for questions and concerns.   *Portions of this note are a verbal dictation therefore any spelling and/or grammatical errors are due to the "[employer]" system interpretation.

## 2022-11-09 NOTE — Progress Notes (Signed)
PT Cancellation Note  Patient Details Name: Sheila Jacobs MRN: [id number] DOB: 1951-08-23   Cancelled Treatment:    Reason Eval/Treat Not Completed: Other (comment) Family in room, reports difficult night of rest. Pt currently sleeping, appreciate if PT follow up later.  Will attempt to see pt this afternoon as schedule permits.  Candie Mile, PT, DPT Physical Therapist Acute Rehabilitation Services [facility]  11/09/2022, 11:31 AM

## 2022-11-09 NOTE — Plan of Care (Signed)
  Problem: Pain Managment: Goal: General experience of comfort will improve Outcome: Progressing   Problem: Safety: Goal: Ability to remain free from injury will improve Outcome: Progressing   Problem: Skin Integrity: Goal: Risk for impaired skin integrity will decrease Outcome: Progressing   

## 2022-11-09 NOTE — Progress Notes (Signed)
PROGRESS NOTE  Sheila Jacobs [id number] DOB: Feb 02, 1951 DOA: 11/05/2022 PCP: Arthur Holms, NP   LOS: 3 days   Brief Narrative / Interim history: 72 year old with past medical history significant for hypertension, hyperlipidemia, GERD, goiter, OSA, prior PE, arthritis, metastatic breast cancer. Sent to the ED for evaluation of chest pain, nausea and vomiting. Patient is not a good historian.   Subjective / 24h Interval events: Very confused overnight, trying to call police on her daughter, thinking that we are going to cut her head open to see what is going on inside  Assesement and Plan: Principal Problem:   Nausea and vomiting Active Problems:   Hypertension   Breast cancer of upper-outer quadrant of left female breast ([place])   Generalized anxiety disorder   Hypothyroidism   History of pulmonary embolus (PE), 12/2017   Chronic anticoagulation   Multiple pulmonary nodules   Hypokalemia   FTT (failure to thrive) in adult   Hyperkalemia   Hyponatremia   Principal problem Stage IV Breast cancer upper outer quadrant left, with multiple metastasis in the lungs, liver, bone, peritoneum, and newly discovered extensive brain mets - MRI of the brain unfortunately shows new onset significant metastatic disease.  -Oncology and radiation oncology consulted, after discussing with the family decision has been made to start radiation therapy next week. -Palliative care consulted as well   Active problems Acute metabolic encephalopathy -likely worsened in the setting of use of steroids, also a multifactorial component due to hospitalization, brain metastatic disease.  Wean off steroids, decrease to Q 12  Nausea, vomiting -likely due to brain mets, advanced cancer.  Chest pain - Suspect related to metastasis diseases.  CTA negative for PE   Acute Metabolic encephalopathy - improving some, possibly related to her brain mets / dehydration   Swelling bottom Lip -Per Daughter patient lower  lip has been always larger.  Monitor  History of PE -continue to increase   HTN - On metoprolol. Hold Norvasc, BP soft.    Hypothyroidism - Continue with Synthroid.    Leukopenia/Pancytopenia  - In setting malignancy. Monitor.    Hypokalemia -potassium improved.  Magnesium normal  Scheduled Meds:  apixaban  5 mg Oral BID   feeding supplement  237 mL Oral BID BM   haloperidol lactate       levothyroxine  100 mcg Oral QAC breakfast   metoprolol succinate  50 mg Oral Daily   pantoprazole  40 mg Oral Daily   polyethylene glycol  17 g Oral BID   Continuous Infusions:  0.9 % NaCl with KCl 20 mEq / L 75 mL/hr at 11/09/22 0106   dexamethasone (DECADRON) IVPB ([facility])     famotidine (PEPCID) IV 20 mg (11/09/22 0130)   PRN Meds:.acetaminophen **OR** acetaminophen, gadobutrol, haloperidol lactate, ipratropium-albuterol, ondansetron **OR** ondansetron (ZOFRAN) IV, senna-docusate  Current Outpatient Medications  Medication Instructions   acetaminophen (TYLENOL) 500 mg, Oral, Every 6 hours PRN   amLODipine (NORVASC) 5 mg, Oral, Daily   cyanocobalamin (VITAMIN B12) 1,000 mcg, Oral, Daily   ELIQUIS 5 MG TABS tablet TAKE 1 TABLET BY MOUTH TWICE A DAY   Ferrous Gluconate (IRON 27 PO) 1 tablet, Oral, 2 times daily   gabapentin (NEURONTIN) 100 mg, Oral, Daily at bedtime   letrozole (FEMARA) 2.5 mg, Oral, Daily   levothyroxine (SYNTHROID) 100 mcg, Oral, Daily before breakfast   metoprolol succinate (TOPROL-XL) 50 mg, Oral, Daily   nitroGLYCERIN (NITROSTAT) 0.4 mg, Sublingual, Every 5 min PRN   omeprazole (PRILOSEC) 20 mg, Oral,  Daily   ondansetron (ZOFRAN-ODT) 4 mg, Oral, Every 8 hours PRN   potassium chloride SA (KLOR-CON M) 20 MEQ tablet 20 mEq, Oral, 2 times daily   triamcinolone cream (KENALOG) 0.1 % 1 application , Topical, Daily PRN   Verzenio 100 mg, Oral, 2 times daily, Swallow tablets whole. Do not chew, crush, or split tablets before swallowing.   Vitamin D 5,000 Units, Oral, Daily     Diet Orders (From admission, onward)     Start     Ordered   11/05/22 2140  Diet regular Room service appropriate? Yes; Fluid consistency: Thin  Diet effective now       Question Answer Comment  Room service appropriate? Yes   Fluid consistency: Thin      11/05/22 2139            DVT prophylaxis:  apixaban (ELIQUIS) tablet 5 mg   Lab Results  Component Value Date   PLT 148 (L) 11/08/2022      Code Status: Full Code  Family Communication: Daughter at bedside  Status is: Inpatient  Remains inpatient appropriate because: severity of illness  Level of care: Telemetry Medical  Consultants:  Palliative Oncology  Objective: Vitals:   11/08/22 0835 11/08/22 0900 11/08/22 2031 11/09/22 0814  BP:  117/63 112/62 122/71  Pulse:  81 73 72  Resp:  18  18  Temp:  98 F (36.7 C) 98.2 F (36.8 C) 98 F (36.7 C)  TempSrc:   Oral Oral  SpO2: 100%  95% 96%  Weight:      Height:        Intake/Output Summary (Last 24 hours) at 11/09/2022 0940 Last data filed at 11/08/2022 2330 Gross per 24 hour  Intake --  Output 800 ml  Net -800 ml    Wt Readings from Last 3 Encounters:  11/05/22 100.7 kg  10/31/22 100.7 kg  10/11/22 111.6 kg    Examination: Constitutional: sleeping ENMT: mmm Neck: normal, supple Respiratory: clear to auscultation bilaterally, no wheezing, no crackles.  Cardiovascular: Regular rate and rhythm, no murmurs / rubs / gallops.  Abdomen: soft, no distention, no tenderness. Bowel sounds positive.  Skin: no rashes  Data Reviewed: I have independently reviewed following labs and imaging studies   CBC Recent Labs  Lab 11/05/22 1115 11/05/22 1355 11/06/22 0327 11/07/22 0140 11/08/22 0304  WBC 2.8*  --  2.1* 2.0* 2.1*  HGB 13.8 12.6 11.4* 10.9* 10.8*  HCT 40.0 37.0 33.3* 30.9* 30.9*  PLT 162  --  143* 139* 148*  MCV 97.6  --  98.5 96.9 97.5  MCH 33.7  --  33.7 34.2* 34.1*  MCHC 34.5  --  34.2 35.3 35.0  RDW 16.1*  --  16.3* 16.6* 16.7*   LYMPHSABS  --   --  0.8  --   --   MONOABS  --   --  0.1  --   --   EOSABS  --   --  0.0  --   --   BASOSABS  --   --  0.1  --   --      Recent Labs  Lab 11/05/22 1115 11/05/22 1343 11/05/22 1355 11/06/22 0327 11/07/22 0140 11/08/22 0304  NA 139  --  140 135 139 138  K 2.8*  --  2.7* 3.1* 3.7 4.1  CL 101  --   --  101 110 111  CO2 21*  --   --  21* 20* 19*  GLUCOSE 112*  --   --  97 98 124*  BUN 6*  --   --  5* <5* 5*  CREATININE 1.07*  --   --  0.87 0.81 0.80  CALCIUM 8.7*  --   --  8.1* 8.4* 8.1*  AST 49*  --   --   --   --  34  ALT 26  --   --   --   --  17  ALKPHOS 345*  --   --   --   --  266*  BILITOT 1.5*  --   --   --   --  1.3*  ALBUMIN 3.3*  --   --   --   --  2.5*  MG 2.4  --   --  2.5*  --  2.2  LATICACIDVEN  --  3.3*  --   --   --   --   TSH  --   --   --   --   --  1.538  BNP 45.1  --   --   --   --   --      ------------------------------------------------------------------------------------------------------------------ No results for input(s): "CHOL", "HDL", "LDLCALC", "TRIG", "CHOLHDL", "LDLDIRECT" in the last 72 hours.  Lab Results  Component Value Date   HGBA1C 5.4 08/17/2020   ------------------------------------------------------------------------------------------------------------------ Recent Labs    11/08/22 0304  TSH 1.538     Cardiac Enzymes No results for input(s): "CKMB", "TROPONINI", "MYOGLOBIN" in the last 168 hours.  Invalid input(s): "CK" ------------------------------------------------------------------------------------------------------------------    Component Value Date/Time   BNP 45.1 11/05/2022 1115   BNP 85.3 07/31/2016 1200    CBG: Recent Labs  Lab 11/05/22 1140  GLUCAP 96     Recent Results (from the past 240 hour(s))  Resp panel by RT-PCR (RSV, Flu A&B, Covid) Anterior Nasal Swab     Status: None   Collection Time: 11/05/22 12:08 PM   Specimen: Anterior Nasal Swab  Result Value Ref Range Status    SARS Coronavirus 2 by RT PCR NEGATIVE NEGATIVE Final   Influenza A by PCR NEGATIVE NEGATIVE Final   Influenza B by PCR NEGATIVE NEGATIVE Final    Comment: (NOTE) The Xpert Xpress SARS-CoV-2/FLU/RSV plus assay is intended as an aid in the diagnosis of influenza from Nasopharyngeal swab specimens and should not be used as a sole basis for treatment. Nasal washings and aspirates are unacceptable for Xpert Xpress SARS-CoV-2/FLU/RSV testing.  Fact Sheet for Patients: EntrepreneurPulse.com.au  Fact Sheet for Healthcare Providers: IncredibleEmployment.be  This test is not yet approved or cleared by the [place] FDA and has been authorized for detection and/or diagnosis of SARS-CoV-2 by FDA under an Emergency Use Authorization (EUA). This EUA will remain in effect (meaning this test can be used) for the duration of the COVID-19 declaration under Section 564(b)(1) of the Act, 21 U.S.C. section 360bbb-3(b)(1), unless the authorization is terminated or revoked.     Resp Syncytial Virus by PCR NEGATIVE NEGATIVE Final    Comment: (NOTE) Fact Sheet for Patients: EntrepreneurPulse.com.au  Fact Sheet for Healthcare Providers: IncredibleEmployment.be  This test is not yet approved or cleared by the [place] FDA and has been authorized for detection and/or diagnosis of SARS-CoV-2 by FDA under an Emergency Use Authorization (EUA). This EUA will remain in effect (meaning this test can be used) for the duration of the COVID-19 declaration under Section 564(b)(1) of the Act, 21 U.S.C. section 360bbb-3(b)(1), unless the authorization is terminated or revoked.  Performed at [facility], [place] [address],  [place], [place] [address]      Radiology Studies: No results found.   Marzetta Board, MD, PhD [facility]  Between 7 am - 7 pm I am available, please contact me via Amion (for emergencies) or  Securechat (non urgent messages)  Between 7 pm - 7 am I am not available, please contact night coverage MD/APP via Amion

## 2022-11-09 NOTE — Progress Notes (Signed)
OT Cancellation Note  Patient Details Name: WANETTA KIRKEY MRN: [id number] DOB: Dec 24, 1950   Cancelled Treatment:    Reason Eval/Treat Not Completed: Fatigue/lethargy limiting ability to participate;Other (comment) (Pt sleeping and family present reporting they would like her to rest.)  Elder Cyphers, OTR/L [facility] Acute Rehabilitation Office: [phone]  Magnus Ivan 11/09/2022, 5:36 PM

## 2022-11-09 NOTE — Progress Notes (Signed)
Physical Therapy Treatment Patient Details Name: Sheila Jacobs MRN: [id number] DOB: April 02, 1951 Today's Date: 11/09/2022   History of Present Illness 72 year old female sent to the ED for evaluation of chest pain, nausea and vomiting; admitted 2/24. PMHX: past medical history significant for hypertension, hyperlipidemia, GERD, goiter, OSA, prior PE, arthritis, metastatic breast cancer. .    PT Comments    Tolerated treatment well today. Mobilizing at a min assist level for bed mobility, transfers, and short distance gait in room. Incontinent of stool due to urgency with transfer to [facility]. Assisted with peri-care/hygiene. Able to stand approx 5 min with light UE support on RW. Family in room and very supportive. Delayed processing and need for multimodal cues for directions with mobility. Patient will continue to benefit from skilled physical therapy services to further improve independence with functional mobility.    Recommendations for follow up therapy are one component of a multi-disciplinary discharge planning process, led by the attending physician.  Recommendations may be updated based on patient status, additional functional criteria and insurance authorization.  Follow Up Recommendations  Home health PT     Assistance Recommended at Discharge Intermittent Supervision/Assistance  Patient can return home with the following A little help with walking and/or transfers;Help with stairs or ramp for entrance;Assistance with cooking/housework   Equipment Recommendations  Other (comment) (Pending palliative consult, if hospice gets on board)    Recommendations for Other Services       Precautions / Restrictions Precautions Precautions: Fall Precaution Comments: Very HOH; writing notes to communicate clearly Restrictions Weight Bearing Restrictions: No     Mobility  Bed Mobility Overal bed mobility: Needs Assistance Bed Mobility: Supine to Sit     Supine to sit: Min assist      General bed mobility comments: Cues for use of bed rails as needed, min assist for pt to pull herself up through hand.    Transfers Overall transfer level: Needs assistance Equipment used: Rolling walker (2 wheels) Transfers: Sit to/from Stand, Bed to chair/wheelchair/BSC Sit to Stand: Min assist   Step pivot transfers: Min assist       General transfer comment: Min assist for boost to stand from bed and BSC. Slow to rise, assist for hand placement. Good stability once upright with hands on RW for support. Min assist for RW control to facilitate direction of turn with step pivot towards BSC. Incontinent of stool before reaching BSC.    Ambulation/Gait Ambulation/Gait assistance: Min assist Gait Distance (Feet): 6 Feet Assistive device: Rolling walker (2 wheels) Gait Pattern/deviations: Step-through pattern, Decreased stride length Gait velocity: decr Gait velocity interpretation: <1.31 ft/sec, indicative of household ambulator   General Gait Details: Min assist for RW control to walk forward in room to recliner. Needs hand over hand assist to hold RW as she pushes it away initially. Reduced awareness. No buckling noted. HR to 120s.   Stairs             Wheelchair Mobility    Modified Rankin (Stroke Patients Only)       Balance                                            Cognition Arousal/Alertness: Awake/alert Behavior During Therapy: WFL for tasks assessed/performed Overall Cognitive Status: Difficult to assess  General Comments: Following written commands with increased time        Exercises      General Comments General comments (skin integrity, edema, etc.): Assisted with peri-care, pt tolerated standing approx 5 min with light UE support on RW.      Pertinent Vitals/Pain Pain Assessment Pain Assessment: Faces (Used written paper for assessment; able to respond no to pain.) Faces Pain  Scale: No hurt Pain Intervention(s): Monitored during session    Home Living                          Prior Function            PT Goals (current goals can now be found in the care plan section) Acute Rehab PT Goals Patient Stated Goal: Did not state, but agreeable to getting up PT Goal Formulation: Patient unable to participate in goal setting Time For Goal Achievement: 11/21/22 Potential to Achieve Goals: Good Progress towards PT goals: Progressing toward goals    Frequency    Min 3X/week      PT Plan Current plan remains appropriate    Co-evaluation              AM-PAC PT "6 Clicks" Mobility   Outcome Measure  Help needed turning from your back to your side while in a flat bed without using bedrails?: A Little Help needed moving from lying on your back to sitting on the side of a flat bed without using bedrails?: A Little Help needed moving to and from a bed to a chair (including a wheelchair)?: A Little Help needed standing up from a chair using your arms (e.g., wheelchair or bedside chair)?: A Little Help needed to walk in hospital room?: A Lot Help needed climbing 3-5 steps with a railing? : A Lot 6 Click Score: 16    End of Session Equipment Utilized During Treatment: Gait belt Activity Tolerance: Patient tolerated treatment well Patient left: in chair;with call bell/phone within reach;with chair alarm set;with family/visitor present Nurse Communication: Mobility status PT Visit Diagnosis: Other abnormalities of gait and mobility (R26.89);Muscle weakness (generalized) (M62.81)     Time: [id number] PT Time Calculation (min) (ACUTE ONLY): 32 min  Charges:  $Therapeutic Activity: 23-37 mins                     Candie Mile, PT, DPT Physical Therapist Acute Rehabilitation Services [place]    Ellouise Newer 11/09/2022, 2:52  PM

## 2022-11-09 NOTE — Care Management Important Message (Signed)
Important Message  Patient Details  Name: Sheila Jacobs MRN: [id number] Date of Birth: February 18, 1951   Medicare Important Message Given:  Yes     Hannah Beat 11/09/2022, 1:52 PM

## 2022-11-09 DEATH — deceased

## 2022-11-10 DIAGNOSIS — E876 Hypokalemia: Secondary | ICD-10-CM | POA: Diagnosis not present

## 2022-11-10 DIAGNOSIS — Z515 Encounter for palliative care: Secondary | ICD-10-CM | POA: Diagnosis not present

## 2022-11-10 DIAGNOSIS — R1114 Bilious vomiting: Secondary | ICD-10-CM | POA: Diagnosis not present

## 2022-11-10 DIAGNOSIS — Z7189 Other specified counseling: Secondary | ICD-10-CM | POA: Diagnosis not present

## 2022-11-10 DIAGNOSIS — Z170 Estrogen receptor positive status [ER+]: Secondary | ICD-10-CM

## 2022-11-10 DIAGNOSIS — C50412 Malignant neoplasm of upper-outer quadrant of left female breast: Secondary | ICD-10-CM

## 2022-11-10 LAB — CBC
HCT: 30.9 % — ABNORMAL LOW (ref 36.0–46.0)
Hemoglobin: 11.4 g/dL — ABNORMAL LOW (ref 12.0–15.0)
MCH: 36.2 pg — ABNORMAL HIGH (ref 26.0–34.0)
MCHC: 36.9 g/dL — ABNORMAL HIGH (ref 30.0–36.0)
MCV: 98.1 fL (ref 80.0–100.0)
Platelets: 165 10*3/uL (ref 150–400)
RBC: 3.15 MIL/uL — ABNORMAL LOW (ref 3.87–5.11)
RDW: 17.2 % — ABNORMAL HIGH (ref 11.5–15.5)
WBC: 4 10*3/uL (ref 4.0–10.5)
nRBC: 1.7 % — ABNORMAL HIGH (ref 0.0–0.2)

## 2022-11-10 LAB — BASIC METABOLIC PANEL
Anion gap: 7 (ref 5–15)
BUN: 10 mg/dL (ref 8–23)
CO2: 19 mmol/L — ABNORMAL LOW (ref 22–32)
Calcium: 8.5 mg/dL — ABNORMAL LOW (ref 8.9–10.3)
Chloride: 112 mmol/L — ABNORMAL HIGH (ref 98–111)
Creatinine, Ser: 0.8 mg/dL (ref 0.44–1.00)
GFR, Estimated: >60 mL/min (ref >60)
Glucose, Bld: 115 mg/dL — ABNORMAL HIGH (ref 70–99)
Potassium: 4.5 mmol/L (ref 3.5–5.1)
Sodium: 138 mmol/L (ref 135–145)

## 2022-11-10 LAB — MAGNESIUM: Magnesium: 2.2 mg/dL (ref 1.7–2.4)

## 2022-11-10 MED ORDER — MORPHINE SULFATE (PF) 2 MG/ML IV SOLN
0.5000 mg | Freq: Once | INTRAVENOUS | Status: AC
  Administered 2022-11-10: 0.5 mg via INTRAVENOUS
  Filled 2022-11-10: qty 1

## 2022-11-10 MED ORDER — MORPHINE SULFATE (PF) 2 MG/ML IV SOLN: 1.0000 mg | Freq: Once | INTRAVENOUS | Status: DC

## 2022-11-10 NOTE — Progress Notes (Signed)
   11/10/22 1000  Mobility  Activity Refused mobility   Mobility Specialist Progress Note  Pt refused mobility d/t wanting to watch TV . Will f/u as able.    Lucious Groves Mobility Specialist  Please contact via SecureChat or Rehab office at [phone]

## 2022-11-10 NOTE — Progress Notes (Signed)
Daily Progress Note   Patient Name: Sheila Jacobs       Date: 11/10/2022 DOB: Mar 27, 1951  Age: 72 y.o. MRN#: [id number] Attending Physician: Caren Griffins, MD Primary Care Physician: Arthur Holms, NP Admit Date: 11/05/2022  Reason for Consultation/Follow-up: Establishing goals of care  Subjective: I have reviewed medical records including EPIC notes and labs.  Patient assessed at the bedside.  She is sleeping comfortably.  Her niece is present visiting.  Did not attempt to arouse patient in order to preserve comfort.  Discussed plans for transfer to [facility] with patient's niece.  She tells me patient's daughter has just stepped out and she has no other needs or concerns at this time.  I then called patient's daughter Sheila Jacobs.  We reviewed her previous conversations with my colleague and I shared the update that hospice of the [place] will not enroll patient in hospice while she undergoes palliative radiation.  Sheila Jacobs confirms with me that if this is the case she would prefer patient to begin with outpatient palliative care, then transition to hospice care if she continues to worsen.  Her priority is patient's comfort, as well as having as much time as possible with the patient.    Explored her thoughts on disposition, as residential hospice is not an option with the goal of pursuing palliative radiation.  She will continue to think about this.  Sheila Jacobs would be able to provide 24/7 care if patient were to move to [place], but she does not have options for 24/7 care locally.  All questions and concerns addressed. Encouraged family to call with questions and/or concerns. PMT card previously provided.  Length of Stay: 4  Physical Exam Vitals and nursing note reviewed.   Constitutional:      General: She is not in acute distress.    Appearance: She is ill-appearing.  HENT:     Right Ear: Decreased hearing noted.     Left Ear: Decreased hearing noted.  Cardiovascular:     Rate and Rhythm: Normal rate.  Pulmonary:     Effort: Pulmonary effort is normal. No respiratory distress.  Skin:    General: Skin is warm and dry.  Neurological:     Comments: asleep            Vital Signs: BP 119/70 (BP Location: Right  Arm)   Pulse 69   Temp 97.8 F (36.6 C) (Oral)   Resp 16   Ht '5\' 3"'$  (1.6 m)   Wt 108.7 kg   SpO2 96%   BMI 42.45 kg/m  SpO2: SpO2: 96 % O2 Device: O2 Device: Room Air O2 Flow Rate: O2 Flow Rate (L/min): 2 L/min       Palliative Assessment/Data: PPS 20%   Palliative Care Assessment & Plan   Patient Profile: 72 y.o. female  with past medical history of HTN, HLD, GERD, goiter, OSA, prior PE, arthritis, and metastatic cancer admitted on 11/05/2022 with chest pain, nausea and vomiting.    Patient is admitted for lactic acidosis, hypokalemia, acute metabolic encephalopathy, possible angioedema of bottom life, failure to thrive. PMT has been consulted to assist with goals of care conversation.  Assessment: Principal Problem:   Nausea and vomiting Active Problems:   Hypertension   Breast cancer of upper-outer quadrant of left female breast (HCC)   Generalized anxiety disorder   Hypothyroidism   History of pulmonary embolus (PE), 12/2017   Chronic anticoagulation   Multiple pulmonary nodules   Hypokalemia   FTT (failure to thrive) in adult   Hyperkalemia   Hyponatremia   Concern about end of life  Recommendations/Plan: Continue DNR/DNI Continue to treat the treatable Goal is for patient to start palliative radiation Monday 3/4 Family are interested in outpatient palliative care follow-up while patient receives palliative radiation.  Appreciate TOC assistance Psychosocial and emotional support provided PMT will continue to  follow peripherally.  Please secure chat or call team line with urgent needs   Prognosis: Poor, hospice appropriate when aligned with patient's/family's goals of care  Discharge Planning: To be determined   Care plan was discussed with patient's niece, patient's daughter    MDM: [place]   Kerrilynn Derenzo Gregary Signs Palliative Medicine Team Team phone # [phone]  Thank you for allowing the Palliative Medicine Team to assist in the care of this patient. Please utilize secure chat with additional questions, if there is no response within 30 minutes please call the above phone number.  Palliative Medicine Team providers are available by phone from 7am to 7pm daily and can be reached through the team cell phone.  Should this patient require assistance outside of these hours, please call the patient's attending physician.  Portions of this note are a verbal dictation therefore any spelling and/or grammatical errors are due to the "[employer] One" system interpretation.

## 2022-11-10 NOTE — Plan of Care (Signed)
  Problem: Education: Goal: Knowledge of General Education information will improve Description: Including pain rating scale, medication(s)/side effects and non-pharmacologic comfort measures Outcome: Progressing   Problem: Pain Managment: Goal: General experience of comfort will improve Outcome: Progressing   

## 2022-11-10 NOTE — Progress Notes (Signed)
Patient's family member reported patient was complaining of  SOB. O2 at 99, RR 19. Contacted on call hospitalist. Per order, 2L of O2 given, Morphine 0.5 mg given and EKG completed.

## 2022-11-10 NOTE — Progress Notes (Signed)
       CROSS COVER NOTE  NAME: Sheila Jacobs MRN: [id number] DOB : July 15, 1951    Date of Service   11/10/2022     HPI/Events of Note   Patient experiencing mild pain at mid chest with shortness of breath.  Respiratory rate and oxygen saturation within normal limits.  Bilateral breath sounds. No other acute changes reported by RN.  Previous chest pain was thought to be related to metastatic disease.  EKG interpreted as NSR with no ST segment changes.  Previous CTA negative for PE.  Order placed for low-dose morphine to assist with pain and SOB.   Interventions/ Plan   EKG Morphine-effective in symptom improvement       Raenette Rover, DNP, [place]

## 2022-11-10 NOTE — TOC Progression Note (Signed)
Transition of Care [facility]) - Progression Note    Patient Details  Name: TIJERA ALLAMAN MRN: [id number] Date of Birth: 04/06/1951  Transition of Care [facility]) CM/SW Contact  Ina Homes, [place] Phone Number: 11/10/2022, 9:37 AM  Clinical Narrative:      SW spoke with [facility] of the [place] [phone]) reports pt cannot undergo radiation under Hospice services but can under their palliative program.   SW spoke with pts daughter [name] ([phone]) reports she would like to pursue palliative care.    SW updated [facility] of the [place])    Expected Discharge Plan and Services                                               Social Determinants of Health (SDOH) Interventions SDOH Screenings   Food Insecurity: No Food Insecurity (11/07/2022)  Housing: Low Risk  (11/07/2022)  Transportation Needs: No Transportation Needs (11/07/2022)  Utilities: Not At Risk (11/07/2022)  Depression (PHQ2-9): Low Risk  (04/06/2019)  Tobacco Use: Medium Risk (11/05/2022)    Readmission Risk Interventions     No data to display

## 2022-11-10 NOTE — Progress Notes (Signed)
Report called to Ecologist at [facility].. patient to transfer to [facility] room 1620.

## 2022-11-10 NOTE — Progress Notes (Signed)
PROGRESS NOTE  Sheila Jacobs [id number] DOB: 1951/07/09 DOA: 11/05/2022 PCP: Arthur Holms, NP   LOS: 4 days   Brief Narrative / Interim history: 72 year old with past medical history significant for hypertension, hyperlipidemia, GERD, goiter, OSA, prior PE, arthritis, metastatic breast cancer. Sent to the ED for evaluation of chest pain, nausea and vomiting. Patient is not a good historian.  Workup in the hospital included an MRI of the brain which showed new onset multiple widespread metastatic disease.  Oncology, radiation oncology as well as palliative care were consulted.  After discussions, decision was made for patient to start radiation therapy on 3/4.  Hospital course complicated by in-hospital delirium, likely steroid-induced, and progressive weakness.  After discussing with the family, radiation oncology, patient will be transferred to [facility] to start radiation therapy on Monday  Subjective / 24h Interval events: Less agitation last night, mild confusion but better than the night before.  Patient herself has no complaints this morning.  She did have an episode of chest pain similar to the one on admission  Assesement and Plan: Principal Problem:   Nausea and vomiting Active Problems:   Hypertension   Breast cancer of upper-outer quadrant of left female breast (HCC)   Generalized anxiety disorder   Hypothyroidism   History of pulmonary embolus (PE), 12/2017   Chronic anticoagulation   Multiple pulmonary nodules   Hypokalemia   FTT (failure to thrive) in adult   Hyperkalemia   Hyponatremia   Principal problem Stage IV Breast cancer upper outer quadrant left, with multiple metastasis in the lungs, liver, bone, peritoneum, and newly discovered extensive brain mets - MRI of the brain unfortunately shows new onset significant metastatic disease.  -Oncology and radiation oncology consulted, after discussing with the family decision has been made to start radiation therapy  next week. -Palliative care consulted as well, patient now DNR   Active problems Acute metabolic encephalopathy, in-hospital delirium-likely worsened in the setting of use of steroids, also a multifactorial component due to hospitalization, brain metastatic disease.  Steroid dose has been decreased, she is better with less delirium but still mildly confused.  Her being hard of hearing makes this evaluation quite difficult  Nausea, vomiting -likely due to brain mets, advanced cancer.  Resolved, no further nausea, vomiting, but persists with poor p.o. intake  Chest pain - Suspect related to metastasis diseases.  CTA negative for PE.  Recurrent chest pain episode last night, resolved   History of PE -no PE at this time around   HTN - On metoprolol. Hold Norvasc, BP normal.    Hypothyroidism - Continue with Synthroid.    Leukopenia/Pancytopenia  - In setting malignancy. Monitor.    Hypokalemia -continue to monitor, blood work this morning pending  Scheduled Meds:  apixaban  5 mg Oral BID   feeding supplement  237 mL Oral BID BM   levothyroxine  100 mcg Oral QAC breakfast   metoprolol succinate  50 mg Oral Daily   pantoprazole  40 mg Oral Daily   polyethylene glycol  17 g Oral BID   Continuous Infusions:  0.9 % NaCl with KCl 20 mEq / L 75 mL/hr at 11/09/22 1500   dexamethasone (DECADRON) IVPB ([facility]) 4 mg (11/10/22 0602)   famotidine (PEPCID) IV 20 mg (11/09/22 2150)   PRN Meds:.acetaminophen **OR** acetaminophen, gadobutrol, ipratropium-albuterol, ondansetron **OR** ondansetron (ZOFRAN) IV, senna-docusate  Current Outpatient Medications  Medication Instructions   acetaminophen (TYLENOL) 500 mg, Oral, Every 6 hours PRN   amLODipine (NORVASC) 5 mg,  Oral, Daily   cyanocobalamin (VITAMIN B12) 1,000 mcg, Oral, Daily   ELIQUIS 5 MG TABS tablet TAKE 1 TABLET BY MOUTH TWICE A DAY   Ferrous Gluconate (IRON 27 PO) 1 tablet, Oral, 2 times daily   gabapentin (NEURONTIN) 100 mg, Oral, Daily  at bedtime   letrozole (FEMARA) 2.5 mg, Oral, Daily   levothyroxine (SYNTHROID) 100 mcg, Oral, Daily before breakfast   metoprolol succinate (TOPROL-XL) 50 mg, Oral, Daily   nitroGLYCERIN (NITROSTAT) 0.4 mg, Sublingual, Every 5 min PRN   omeprazole (PRILOSEC) 20 mg, Oral, Daily   ondansetron (ZOFRAN-ODT) 4 mg, Oral, Every 8 hours PRN   potassium chloride SA (KLOR-CON M) 20 MEQ tablet 20 mEq, Oral, 2 times daily   triamcinolone cream (KENALOG) 0.1 % 1 application , Topical, Daily PRN   Verzenio 100 mg, Oral, 2 times daily, Swallow tablets whole. Do not chew, crush, or split tablets before swallowing.   Vitamin D 5,000 Units, Oral, Daily    Diet Orders (From admission, onward)     Start     Ordered   11/05/22 2140  Diet regular Room service appropriate? Yes; Fluid consistency: Thin  Diet effective now       Question Answer Comment  Room service appropriate? Yes   Fluid consistency: Thin      11/05/22 2139            DVT prophylaxis:  apixaban (ELIQUIS) tablet 5 mg   Lab Results  Component Value Date   PLT 148 (L) 11/08/2022      Code Status: DNR  Family Communication: Daughter at bedside  Status is: Inpatient  Remains inpatient appropriate because: severity of illness, transferred to [facility], signout given to Dr Bonner Puna  Level of care: Med-Surg  Consultants:  Palliative Oncology  Objective: Vitals:   11/08/22 2031 11/09/22 0814 11/09/22 2137 11/10/22 0413  BP: 112/62 122/71 127/85 104/62  Pulse: 73 72 77 71  Resp:  18  18  Temp: 98.2 F (36.8 C) 98 F (36.7 C) 97.6 F (36.4 C) 97.6 F (36.4 C)  TempSrc: Oral Oral Oral   SpO2: 95% 96% 100% 96%  Weight:    108.7 kg  Height:        Intake/Output Summary (Last 24 hours) at 11/10/2022 0750 Last data filed at 11/10/2022 0419 Gross per 24 hour  Intake 4060.29 ml  Output 550 ml  Net 3510.29 ml    Wt Readings from Last 3 Encounters:  11/10/22 108.7 kg  10/31/22 100.7 kg  10/11/22 111.6 kg     Examination: Constitutional: NAD Eyes: lids and conjunctivae normal, no scleral icterus ENMT: mmm Neck: normal, supple Respiratory: clear to auscultation bilaterally, no wheezing, no crackles. Normal respiratory effort.  Cardiovascular: Regular rate and rhythm, no murmurs / rubs / gallops. No LE edema. Abdomen: soft, no distention, no tenderness. Bowel sounds positive.  Skin: no rashes  Data Reviewed: I have independently reviewed following labs and imaging studies   CBC Recent Labs  Lab 11/05/22 1115 11/05/22 1355 11/06/22 0327 11/07/22 0140 11/08/22 0304  WBC 2.8*  --  2.1* 2.0* 2.1*  HGB 13.8 12.6 11.4* 10.9* 10.8*  HCT 40.0 37.0 33.3* 30.9* 30.9*  PLT 162  --  143* 139* 148*  MCV 97.6  --  98.5 96.9 97.5  MCH 33.7  --  33.7 34.2* 34.1*  MCHC 34.5  --  34.2 35.3 35.0  RDW 16.1*  --  16.3* 16.6* 16.7*  LYMPHSABS  --   --  0.8  --   --  MONOABS  --   --  0.1  --   --   EOSABS  --   --  0.0  --   --   BASOSABS  --   --  0.1  --   --      Recent Labs  Lab 11/05/22 1115 11/05/22 1343 11/05/22 1355 11/06/22 0327 11/07/22 0140 11/08/22 0304  NA 139  --  140 135 139 138  K 2.8*  --  2.7* 3.1* 3.7 4.1  CL 101  --   --  101 110 111  CO2 21*  --   --  21* 20* 19*  GLUCOSE 112*  --   --  97 98 124*  BUN 6*  --   --  5* <5* 5*  CREATININE 1.07*  --   --  0.87 0.81 0.80  CALCIUM 8.7*  --   --  8.1* 8.4* 8.1*  AST 49*  --   --   --   --  34  ALT 26  --   --   --   --  17  ALKPHOS 345*  --   --   --   --  266*  BILITOT 1.5*  --   --   --   --  1.3*  ALBUMIN 3.3*  --   --   --   --  2.5*  MG 2.4  --   --  2.5*  --  2.2  LATICACIDVEN  --  3.3*  --   --   --   --   TSH  --   --   --   --   --  1.538  BNP 45.1  --   --   --   --   --      ------------------------------------------------------------------------------------------------------------------ No results for input(s): "CHOL", "HDL", "LDLCALC", "TRIG", "CHOLHDL", "LDLDIRECT" in the last 72 hours.  Lab  Results  Component Value Date   HGBA1C 5.4 08/17/2020   ------------------------------------------------------------------------------------------------------------------ Recent Labs    11/08/22 0304  TSH 1.538     Cardiac Enzymes No results for input(s): "CKMB", "TROPONINI", "MYOGLOBIN" in the last 168 hours.  Invalid input(s): "CK" ------------------------------------------------------------------------------------------------------------------    Component Value Date/Time   BNP 45.1 11/05/2022 1115   BNP 85.3 07/31/2016 1200    CBG: Recent Labs  Lab 11/05/22 1140  GLUCAP 96     Recent Results (from the past 240 hour(s))  Resp panel by RT-PCR (RSV, Flu A&B, Covid) Anterior Nasal Swab     Status: None   Collection Time: 11/05/22 12:08 PM   Specimen: Anterior Nasal Swab  Result Value Ref Range Status   SARS Coronavirus 2 by RT PCR NEGATIVE NEGATIVE Final   Influenza A by PCR NEGATIVE NEGATIVE Final   Influenza B by PCR NEGATIVE NEGATIVE Final    Comment: (NOTE) The Xpert Xpress SARS-CoV-2/FLU/RSV plus assay is intended as an aid in the diagnosis of influenza from Nasopharyngeal swab specimens and should not be used as a sole basis for treatment. Nasal washings and aspirates are unacceptable for Xpert Xpress SARS-CoV-2/FLU/RSV testing.  Fact Sheet for Patients: EntrepreneurPulse.com.au  Fact Sheet for Healthcare Providers: IncredibleEmployment.be  This test is not yet approved or cleared by the [place] FDA and has been authorized for detection and/or diagnosis of SARS-CoV-2 by FDA under an Emergency Use Authorization (EUA). This EUA will remain in effect (meaning this test can be used) for the duration of the COVID-19 declaration under Section 564(b)(1) of the Act, 21 U.S.C.  section 360bbb-3(b)(1), unless the authorization is terminated or revoked.     Resp Syncytial Virus by PCR NEGATIVE NEGATIVE Final    Comment:  (NOTE) Fact Sheet for Patients: EntrepreneurPulse.com.au  Fact Sheet for Healthcare Providers: IncredibleEmployment.be  This test is not yet approved or cleared by the [place] FDA and has been authorized for detection and/or diagnosis of SARS-CoV-2 by FDA under an Emergency Use Authorization (EUA). This EUA will remain in effect (meaning this test can be used) for the duration of the COVID-19 declaration under Section 564(b)(1) of the Act, 21 U.S.C. section 360bbb-3(b)(1), unless the authorization is terminated or revoked.  Performed at [facility], [place] [address], [place], [place] [address]      Radiology Studies: No results found.   Marzetta Board, MD, PhD [facility]  Between 7 am - 7 pm I am available, please contact me via Amion (for emergencies) or Securechat (non urgent messages)  Between 7 pm - 7 am I am not available, please contact night coverage MD/APP via Amion

## 2022-11-11 DIAGNOSIS — R4589 Other symptoms and signs involving emotional state: Secondary | ICD-10-CM

## 2022-11-11 DIAGNOSIS — Z7189 Other specified counseling: Secondary | ICD-10-CM

## 2022-11-11 DIAGNOSIS — Z86711 Personal history of pulmonary embolism: Secondary | ICD-10-CM | POA: Diagnosis not present

## 2022-11-11 DIAGNOSIS — Z515 Encounter for palliative care: Secondary | ICD-10-CM

## 2022-11-11 DIAGNOSIS — C50911 Malignant neoplasm of unspecified site of right female breast: Secondary | ICD-10-CM | POA: Diagnosis not present

## 2022-11-11 DIAGNOSIS — F411 Generalized anxiety disorder: Secondary | ICD-10-CM | POA: Diagnosis not present

## 2022-11-11 DIAGNOSIS — C799 Secondary malignant neoplasm of unspecified site: Secondary | ICD-10-CM

## 2022-11-11 DIAGNOSIS — R627 Adult failure to thrive: Secondary | ICD-10-CM | POA: Diagnosis not present

## 2022-11-11 DIAGNOSIS — R112 Nausea with vomiting, unspecified: Secondary | ICD-10-CM | POA: Diagnosis not present

## 2022-11-11 DIAGNOSIS — C7931 Secondary malignant neoplasm of brain: Principal | ICD-10-CM

## 2022-11-11 NOTE — Progress Notes (Signed)
Daily Progress Note   Patient Name: Sheila Jacobs       Date: 11/11/2022 DOB: 10-Jul-1951  Age: 72 y.o. MRN#: [id number] Attending Physician: Barb Merino, MD Primary Care Physician: Arthur Holms, NP Admit Date: 11/05/2022 Length of Stay: 5 days  Reason for Consultation/Follow-up: Establishing goals of care  Subjective:   CC: Following up regarding goals for medical care.   Subjective:  Patient was transferred from [facility] for palliative brain radiation.  Patient has metastatic cancer.  Patient was being seen by palliative provider over at [facility].  Hospitalist engaged this palliative provider to follow-up with patient today.  Reviewed EMR prior to seeing patient.  Also spoke with palliative team member at [facility].  Presented to bedside and introduced myself as a member of the palliative medicine team.  Patient denies any concerns at this time and notes that she is incredibly hard of hearing so she defers all conversations to her daughter.  Her daughter Sheila Jacobs is at bedside along with a niece.  Spent time reviewing care plan.  Daughter seeking whole brain radiation with hope that it will allow patient to have more time.  We discussed risk and benefits of whole brain radiation.  Emphasized the fact that radiation oncology even noted in their note on 2/29 noted that in light of patient's extensive systemic disease, doubted this would provide significant survival benefit.  Daughter willing to trial whole brain radiation at this time and will discontinue if patient has adverse effects.  Noted palliative medicine team will continue to follow along and should patient decide no longer to proceed with whole brain radiation, that home hospice support could be pursued.  Questions answered at that time.  Provided emotional support via active listening.  Updated hospitalist regarding conversation.  Objective:   Vital Signs:  BP (!) 153/70 (BP Location: Right Leg)   Pulse 73    Temp 98 F (36.7 C) (Oral)   Resp 18   Ht '5\' 3"'$  (1.6 m)   Wt 108.7 kg   SpO2 95%   BMI 42.45 kg/m   Physical Exam: General: NAD, awake, HOH (defers conversation to daughter) Eyes: no drainage noted HENT: moist mucous membranes Cardiovascular: RRR Respiratory: no increased work of breathing noted, not in respiratory distress Abdomen: not distended Skin: no rashes or lesions on visible skin Neuro: awake, HOH  Imaging:  I personally reviewed recent imaging.   Assessment & Plan:   Assessment: 72 y.o. female  with past medical history of HTN, HLD, GERD, goiter, OSA, prior PE, arthritis, and metastatic breast cancer admitted on 11/05/2022 with chest pain, nausea and vomiting.    Recommendations/Plan: # Complex medical decision making/goals of care:  - Patient and daughter to proceed with whole brain radiation.  Should patient have adverse effects from radiation, daughter noted we will discontinue at that time.  Patient plan for 10 rounds of whole brain radiation.  Will continue with DNR/DNI status.  Patient will continue to receive palliative support while receiving radiation.  Can transition to hospice care once radiation completed if daughter determines this is best support for them as a family.  -  Code Status: DNR  # Symptom management:  -As per primary team  # Discharge Planning: Completing WBRT. Palliative supporting. Potential hospice once WBRT completed.   Discussed with: patient, daughter, hospitalist, palliative team  Thank you for allowing the palliative care team to participate in the care [name].  Chelsea Aus, DO Palliative Care Provider PMT #  [phone]  This provider spent a total of 35 minutes providing patient's care.  Includes review of EMR, discussing care with other staff members involved in patient's medical care, obtaining relevant history and information from patient and/or patient's family, and personal review of imaging and lab work.  Greater than 50% of the time was spent counseling and coordinating care related to the above assessment and plan.    *Please note that this is a verbal dictation therefore any spelling or grammatical errors are due to the "[employer]" system interpretation.

## 2022-11-11 NOTE — Progress Notes (Signed)
PROGRESS NOTE    Sheila Jacobs  [id number] DOB: Dec 29, 1950 DOA: 11/05/2022 PCP: Arthur Holms, NP    Brief Narrative:  72 year old with history of hypertension, hyperlipidemia, GERD, obstructive sleep apnea, prior PE, metastatic breast cancer who presented to the hospital with chest pain, nausea and vomiting.  Lives at home with granddaughter.  Workup in the hospital included MRI of the brain that showed new onset multiple widespread metastatic disease.  Oncology, radiation oncology as well as palliative care were involved.  Patient with widely metastatic cancer, with multiple brain mets transferred to [facility] hospital to start inpatient palliative radiation to the brain. Apparently patient also developed delirium, she is hard of hearing.  Still remains intermittently confused.   Assessment & Plan:   Stage IV breast cancer, multiple metastasis to lungs, liver, bone, peritoneum and extensive brain mets: Seen by oncology and radiation oncology. Apparently she is transferred to [facility] hospital for brain radiation. Palliative care following.  See goal of care discussion below.  Acute metabolic encephalopathy, in-hospital delirium: Multifactorial.  Steroids, brain mets, hospitalizations.  Nonfocal.  Clinically stabilizing.  All-time fall precautions.  Delirium precautions.  Chronic pulmonary embolism: On Eliquis.  Essential hypertension blood pressure stable on metoprolol.  Hypothyroidism: On Synthroid.  Interdisciplinary Goals of Care Family Meeting  Date carried out: 11/11/2022  Location of the meeting: Bedside  Member's involved: Physician and Family Member or next of kin  Durable Power of Attorney or acting medical decision maker: daughter [name]     Code status:   Code Status: DNR    Patient arrived to [facility] today to start radiation of the brain tomorrow 3/4. Met with the patient's daughter Ms. [name] and her niece at the bedside.  [name] is  Education administrator.  Patient does live with her granddaughter in [place].  Is stable and lives in [place]. After examining patient, I discussed with patient's daughter who was at the bedside about family's understanding about radiation and expectations of treatment.  Apparently they expressed that they were in hope that radiation to her brain will improve her survival. We discussed in detail.  I showed them about how she has multiple metastatic lesions, she is in poor clinical status, she is confused and further treatment may make her more confused.  Radiation to her brain is not going to change the course of her metastatic disease.  Family understands. Given her clinical status, I recommended patient to go home with home hospice program and not to pursue further treatment including radiation.  Family to think about it.  They are agreeable to discuss about home hospice program. I called and discussed case with palliative care physician who will meet with them and facilitate discussion, educate and possibly family will agree to go home with home hospice.    DVT prophylaxis:  apixaban (ELIQUIS) tablet 5 mg   Code Status: DNR Family Communication: Daughter at the bedside Disposition Plan: Status is: Inpatient Remains inpatient appropriate because: Unsafe disposition plan     Consultants:  Oncology Radiation oncology Palliative care  Procedures:  None  Antimicrobials:  None   Subjective: Patient seen and examined in the morning rounds.  She looks very comfortable.  She is hard of hearing however she was able to understand with lipreading.  She gave me a thumbs up.  She says "I am not hurting, I feel fine".  According to the daughter at the bedside she had some confusion last night and intermittently talks confused otherwise  and has been more quiet and composed these days. Detailed discussion about goal of care, see above.  Objective: Vitals:   11/10/22 1506  11/10/22 1839 11/10/22 2207 11/11/22 0323  BP: (!) 143/68 130/84 (!) 128/101 (!) 153/70  Pulse: 73 72 77 73  Resp: '18 18  18  '$ Temp: 98 F (36.7 C) 97.8 F (36.6 C) 98.1 F (36.7 C) 98 F (36.7 C)  TempSrc: Oral Oral Oral Oral  SpO2: 100% 97% 97% 95%  Weight:      Height:        Intake/Output Summary (Last 24 hours) at 11/11/2022 1207 Last data filed at 11/11/2022 0300 Gross per 24 hour  Intake 1222.27 ml  Output 900 ml  Net 322.27 ml   Filed Weights   11/05/22 1100 11/10/22 0413  Weight: 100.7 kg 108.7 kg    Examination:  General exam: Appears calm and comfortable .  Not in distress.  Difficult to keep up conversation, however she tries to engage. Respiratory system: No added sounds. Cardiovascular system: S1 & S2 heard, RRR. No pedal edema. Gastrointestinal system: Soft.  Nontender.  Bowel sound present. Central nervous system: Alert and awake.  Very difficult to ask orientation questions as she should find to everything.  Follows simple commands.  Moves all extremities equally but profoundly debilitated.    Data Reviewed: I have personally reviewed following labs and imaging studies  CBC: Recent Labs  Lab 11/05/22 1115 11/05/22 1355 11/06/22 0327 11/07/22 0140 11/08/22 0304 11/10/22 0911  WBC 2.8*  --  2.1* 2.0* 2.1* 4.0  NEUTROABS  --   --  1.1*  --   --   --   HGB 13.8 12.6 11.4* 10.9* 10.8* 11.4*  HCT 40.0 37.0 33.3* 30.9* 30.9* 30.9*  MCV 97.6  --  98.5 96.9 97.5 98.1  PLT 162  --  143* 139* 148* [id number]   Basic Metabolic Panel: Recent Labs  Lab 11/05/22 1115 11/05/22 1355 11/06/22 0327 11/07/22 0140 11/08/22 0304 11/10/22 0911  NA 139 140 135 139 138 138  K 2.8* 2.7* 3.1* 3.7 4.1 4.5  CL 101  --  101 110 111 112*  CO2 21*  --  21* 20* 19* 19*  GLUCOSE 112*  --  97 98 124* 115*  BUN 6*  --  5* <5* 5* 10  CREATININE 1.07*  --  0.87 0.81 0.80 0.80  CALCIUM 8.7*  --  8.1* 8.4* 8.1* 8.5*  MG 2.4  --  2.5*  --  2.2 2.2   GFR: Estimated Creatinine  Clearance: 74.1 mL/min (by C-G formula based on SCr of 0.8 mg/dL). Liver Function Tests: Recent Labs  Lab 11/05/22 1115 11/08/22 0304  AST 49* 34  ALT 26 17  ALKPHOS 345* 266*  BILITOT 1.5* 1.3*  PROT 7.5 5.6*  ALBUMIN 3.3* 2.5*   Recent Labs  Lab 11/05/22 1115  LIPASE 55*   No results for input(s): "AMMONIA" in the last 168 hours. Coagulation Profile: No results for input(s): "INR", "PROTIME" in the last 168 hours. Cardiac Enzymes: No results for input(s): "CKTOTAL", "CKMB", "CKMBINDEX", "TROPONINI" in the last 168 hours. BNP (last 3 results) No results for input(s): "PROBNP" in the last 8760 hours. HbA1C: No results for input(s): "HGBA1C" in the last 72 hours. CBG: Recent Labs  Lab 11/05/22 1140  GLUCAP 96   Lipid Profile: No results for input(s): "CHOL", "HDL", "LDLCALC", "TRIG", "CHOLHDL", "LDLDIRECT" in the last 72 hours. Thyroid Function Tests: No results for input(s): "TSH", "T4TOTAL", "FREET4", "T3FREE", "THYROIDAB"  in the last 72 hours. Anemia Panel: No results for input(s): "VITAMINB12", "FOLATE", "FERRITIN", "TIBC", "IRON", "RETICCTPCT" in the last 72 hours. Sepsis Labs: Recent Labs  Lab 11/05/22 1343  LATICACIDVEN 3.3*    Recent Results (from the past 240 hour(s))  Resp panel by RT-PCR (RSV, Flu A&B, Covid) Anterior Nasal Swab     Status: None   Collection Time: 11/05/22 12:08 PM   Specimen: Anterior Nasal Swab  Result Value Ref Range Status   SARS Coronavirus 2 by RT PCR NEGATIVE NEGATIVE Final   Influenza A by PCR NEGATIVE NEGATIVE Final   Influenza B by PCR NEGATIVE NEGATIVE Final    Comment: (NOTE) The Xpert Xpress SARS-CoV-2/FLU/RSV plus assay is intended as an aid in the diagnosis of influenza from Nasopharyngeal swab specimens and should not be used as a sole basis for treatment. Nasal washings and aspirates are unacceptable for Xpert Xpress SARS-CoV-2/FLU/RSV testing.  Fact Sheet for  Patients: EntrepreneurPulse.com.au  Fact Sheet for Healthcare Providers: IncredibleEmployment.be  This test is not yet approved or cleared by the [place] FDA and has been authorized for detection and/or diagnosis of SARS-CoV-2 by FDA under an Emergency Use Authorization (EUA). This EUA will remain in effect (meaning this test can be used) for the duration of the COVID-19 declaration under Section 564(b)(1) of the Act, 21 U.S.C. section 360bbb-3(b)(1), unless the authorization is terminated or revoked.     Resp Syncytial Virus by PCR NEGATIVE NEGATIVE Final    Comment: (NOTE) Fact Sheet for Patients: EntrepreneurPulse.com.au  Fact Sheet for Healthcare Providers: IncredibleEmployment.be  This test is not yet approved or cleared by the [place] FDA and has been authorized for detection and/or diagnosis of SARS-CoV-2 by FDA under an Emergency Use Authorization (EUA). This EUA will remain in effect (meaning this test can be used) for the duration of the COVID-19 declaration under Section 564(b)(1) of the Act, 21 U.S.C. section 360bbb-3(b)(1), unless the authorization is terminated or revoked.  Performed at [facility], [place] [address] [place], [place] [address]          Radiology Studies: No results found.      Scheduled Meds:  apixaban  5 mg Oral BID   feeding supplement  237 mL Oral BID BM   levothyroxine  100 mcg Oral QAC breakfast   metoprolol succinate  50 mg Oral Daily   pantoprazole  40 mg Oral Daily   polyethylene glycol  17 g Oral BID   Continuous Infusions:  0.9 % NaCl with KCl 20 mEq / L 75 mL/hr at 11/11/22 0000   dexamethasone (DECADRON) IVPB ([facility]) 4 mg (11/11/22 0619)   famotidine (PEPCID) IV 20 mg (11/11/22 1005)     LOS: 5 days    Time spent: 50 minutes including goal of care discussion.    Barb Merino, MD [facility] Pager [phone]

## 2022-11-11 NOTE — Plan of Care (Signed)
Patient AOX4, forgetful and hearing impaired.  VSS throughout shift.  All meds given on time as ordered.  Denied pain.  Purewick in place and pt had one BM this shift.  POC maintained, will continue to monitor.  Problem: Education: Goal: Knowledge of General Education information will improve Description: Including pain rating scale, medication(s)/side effects and non-pharmacologic comfort measures Outcome: Progressing   Problem: Health Behavior/Discharge Planning: Goal: Ability to manage health-related needs will improve Outcome: Progressing   Problem: Clinical Measurements: Goal: Ability to maintain clinical measurements within normal limits will improve Outcome: Progressing Goal: Will remain free from infection Outcome: Progressing Goal: Diagnostic test results will improve Outcome: Progressing Goal: Respiratory complications will improve Outcome: Progressing Goal: Cardiovascular complication will be avoided Outcome: Progressing   Problem: Activity: Goal: Risk for activity intolerance will decrease Outcome: Progressing   Problem: Nutrition: Goal: Adequate nutrition will be maintained Outcome: Progressing   Problem: Coping: Goal: Level of anxiety will decrease Outcome: Progressing   Problem: Elimination: Goal: Will not experience complications related to bowel motility Outcome: Progressing Goal: Will not experience complications related to urinary retention Outcome: Progressing   Problem: Pain Managment: Goal: General experience of comfort will improve Outcome: Progressing   Problem: Safety: Goal: Ability to remain free from injury will improve Outcome: Progressing   Problem: Skin Integrity: Goal: Risk for impaired skin integrity will decrease Outcome: Progressing

## 2022-11-11 NOTE — TOC Progression Note (Signed)
Transition of Care [facility]) - Progression Note    Patient Details  Name: Sheila Jacobs MRN: [id number] Date of Birth: September 01, 1951  [facility] of [facility]) CM/SW Contact  Rodney Booze, [place] Phone Number: 11/11/2022, 10:24 AM  Clinical Narrative:    CSW spoke with the patients daughter, the daughter was inquiring about  hospice however decided to keep the patient local. TOC will continue to follow DC.        Expected Discharge Plan and Services                                               Social Determinants of Health (SDOH) Interventions SDOH Screenings   Food Insecurity: No Food Insecurity (11/07/2022)  Housing: Low Risk  (11/07/2022)  Transportation Needs: No Transportation Needs (11/07/2022)  Utilities: Not At Risk (11/07/2022)  Depression (PHQ2-9): Low Risk  (04/06/2019)  Tobacco Use: Medium Risk (11/05/2022)    Readmission Risk Interventions     No data to display

## 2022-11-12 ENCOUNTER — Ambulatory Visit
Admit: 2022-11-12 | Discharge: 2022-11-12 | Disposition: A | Discharge status: Home / Self Care | Payer: 59 | Attending: Radiation Oncology | Admitting: Radiation Oncology

## 2022-11-12 ENCOUNTER — Other Ambulatory Visit: Payer: Self-pay

## 2022-11-12 DIAGNOSIS — R112 Nausea with vomiting, unspecified: Secondary | ICD-10-CM | POA: Diagnosis not present

## 2022-11-12 DIAGNOSIS — C50912 Malignant neoplasm of unspecified site of left female breast: Secondary | ICD-10-CM

## 2022-11-12 DIAGNOSIS — C799 Secondary malignant neoplasm of unspecified site: Secondary | ICD-10-CM

## 2022-11-12 DIAGNOSIS — R4589 Other symptoms and signs involving emotional state: Secondary | ICD-10-CM

## 2022-11-12 DIAGNOSIS — R627 Adult failure to thrive: Secondary | ICD-10-CM | POA: Diagnosis not present

## 2022-11-12 DIAGNOSIS — Z515 Encounter for palliative care: Secondary | ICD-10-CM | POA: Diagnosis not present

## 2022-11-12 DIAGNOSIS — F411 Generalized anxiety disorder: Secondary | ICD-10-CM | POA: Diagnosis not present

## 2022-11-12 MED ORDER — FAMOTIDINE 20 MG PO TABS
20.0000 mg | ORAL_TABLET | Freq: Two times a day (BID) | ORAL | Status: DC
  Administered 2022-11-12 – 2022-11-14 (×4): 20 mg via ORAL
  Filled 2022-11-12 (×4): qty 1

## 2022-11-12 MED ORDER — DEXAMETHASONE 4 MG PO TABS
4.0000 mg | ORAL_TABLET | Freq: Every day | ORAL | Status: DC
  Administered 2022-11-13 – 2022-11-23 (×11): 4 mg via ORAL
  Filled 2022-11-12 (×11): qty 1

## 2022-11-12 NOTE — Care Management Important Message (Signed)
Important Message  Patient Details IM Letter given. Name: Sheila Jacobs MRN: [id number] Date of Birth: Jul 28, 1951   Medicare Important Message Given:  Yes     Kerin Salen 11/12/2022, 9:11 AM

## 2022-11-12 NOTE — TOC Progression Note (Signed)
Transition of Care [facility]) - Progression Note    Patient Details  Name: Sheila Jacobs MRN: [id number] Date of Birth: 1951-06-03  Transition of Care [facility]) CM/SW [place], RN Phone Number:[phone]  11/12/2022, 12:18 PM  Clinical Narrative:    CM following up to identify plan. CM called [place] to determine status of referral. Per [place] notes referral was received but patient was placed on hold due to patient undergoing whole brain radiation. Follow up to determine services after radiation completed.         Expected Discharge Plan and Services                                               Social Determinants of Health (SDOH) Interventions SDOH Screenings   Food Insecurity: No Food Insecurity (11/07/2022)  Housing: Low Risk  (11/07/2022)  Transportation Needs: No Transportation Needs (11/07/2022)  Utilities: Not At Risk (11/07/2022)  Depression (PHQ2-9): Low Risk  (04/06/2019)  Tobacco Use: Medium Risk (11/05/2022)    Readmission Risk Interventions     No data to display

## 2022-11-12 NOTE — Progress Notes (Signed)
Physical Therapy Treatment Patient Details Name: Sheila Jacobs MRN: [id number] DOB: [birth year] Today's Date: 11/12/2022   History of Present Illness 72 year old female sent to the ED for evaluation of chest pain, nausea and vomiting; admitted 2/24. PMHX: past medical history significant for hypertension, hyperlipidemia, GERD, goiter, OSA, prior PE, arthritis, metastatic breast cancer.    PT Comments    Pt soiled in stool upon arrival to room, session began with PT assisting with pericare. Pt overall requiring mod +2 assist for bed mobility, pt with some difficulty with trunk and LE translation during rolling today. PT encouraged EOB and OOB post-clean up, pt declines repeatedly and is agreeable to only limited LE exercises. Pt's daughter present in room.    Recommendations for follow up therapy are one component of a multi-disciplinary discharge planning process, led by the attending physician.  Recommendations may be updated based on patient status, additional functional criteria and insurance authorization.  Follow Up Recommendations  Home health PT     Assistance Recommended at Discharge Frequent or constant Supervision/Assistance  Patient can return home with the following A lot of help with walking and/or transfers;A lot of help with bathing/dressing/bathroom   Equipment Recommendations  None recommended by PT    Recommendations for Other Services       Precautions / Restrictions Precautions Precautions: Fall Precaution Comments: Very HOH; writing notes to communicate clearly Restrictions Weight Bearing Restrictions: No     Mobility  Bed Mobility Overal bed mobility: Needs Assistance Bed Mobility: Rolling Rolling: Mod assist, +2 for safety/equipment         General bed mobility comments: rolling bilat for pericare, assist for trunk and LE translation. max asssist for boost up in bed using trendelenburg function and bed pads    Transfers                    General transfer comment: pt declines attempt    Ambulation/Gait                   Stairs             Wheelchair Mobility    Modified Rankin (Stroke Patients Only)       Balance Overall balance assessment: Needs assistance                                          Cognition Arousal/Alertness: Awake/alert Behavior During Therapy: WFL for tasks assessed/performed Overall Cognitive Status: Difficult to assess                                 General Comments: Following written commands with increased time, self-limiting        Exercises General Exercises - Lower Extremity Heel Slides: AAROM, Both, 5 reps, Supine    General Comments        Pertinent Vitals/Pain Pain Assessment Pain Assessment: Faces Faces Pain Scale: Hurts a little bit Pain Location: generalized during rolling Pain Descriptors / Indicators: Grimacing, Guarding Pain Intervention(s): Limited activity within patient's tolerance, Monitored during session    Home Living                          Prior Function            PT Goals (current goals can now be  found in the care plan section) Acute Rehab PT Goals Patient Stated Goal: Did not state, but agreeable to getting up PT Goal Formulation: Patient unable to participate in goal setting Time For Goal Achievement: 11/21/22 Potential to Achieve Goals: Good Progress towards PT goals: Not progressing toward goals - comment (pt declines EOB or OOB)    Frequency    Min 3X/week      PT Plan Current plan remains appropriate    Co-evaluation              AM-PAC PT "6 Clicks" Mobility   Outcome Measure  Help needed turning from your back to your side while in a flat bed without using bedrails?: A Lot Help needed moving from lying on your back to sitting on the side of a flat bed without using bedrails?: A Lot Help needed moving to and from a bed to a chair (including a wheelchair)?:  A Lot Help needed standing up from a chair using your arms (e.g., wheelchair or bedside chair)?: Total Help needed to walk in hospital room?: Total Help needed climbing 3-5 steps with a railing? : Total 6 Click Score: 9    End of Session   Activity Tolerance: Patient limited by fatigue Patient left: in bed;with call bell/phone within reach;with bed alarm set;with family/visitor present Nurse Communication: Mobility status PT Visit Diagnosis: Other abnormalities of gait and mobility (R26.89);Muscle weakness (generalized) (M62.81)     Time: 1020-1040 PT Time Calculation (min) (ACUTE ONLY): 20 min  Charges:  $Therapeutic Activity: 8-22 mins                     Stacie Glaze, PT DPT Acute Rehabilitation Services Pager [phone]  Office [phone]    Hewitt E Ruffin Pyo 11/12/2022, 11:52 AM

## 2022-11-12 NOTE — Progress Notes (Signed)
OT Cancellation Note  Patient Details Name: Sheila Jacobs MRN: [id number] DOB: 14-Feb-1951   Cancelled Treatment:    Reason Eval/Treat Not Completed: Patient at procedure or test/ unavailable Patient is off the hall at this time. OT to continue to follow and check back as schedule will allow.  Rennie Plowman, MS Acute Rehabilitation Department Office# [phone]  11/12/2022, 3:17 PM

## 2022-11-12 NOTE — Progress Notes (Signed)
PROGRESS NOTE    Sheila Jacobs  [id number] DOB: 06/10/1951 DOA: 11/05/2022 PCP: Arthur Holms, NP    Brief Narrative:  72 year old with history of hypertension, hyperlipidemia, GERD, obstructive sleep apnea, prior PE, metastatic breast cancer who presented to the hospital with chest pain, nausea and vomiting.  Lives at home with granddaughter.  Workup in the hospital included MRI of the brain that showed new onset multiple widespread metastatic disease.  Oncology, radiation oncology as well as palliative care were involved.  Patient with widely metastatic cancer, with multiple brain mets transferred to [facility] long hospital to start inpatient palliative radiation to the brain. Apparently patient also developed delirium, she is hard of hearing.  Still remains intermittently confused but overall stable.   Assessment & Plan:   Stage IV breast cancer, multiple metastasis to lungs, liver, bone, peritoneum and extensive brain mets: Seen by oncology and radiation oncology. Apparently she is transferred to [facility] long hospital for brain radiation. Palliative care following.  See goal of care discussion below. Patient currently on IV dexamethasone and Pepcid, will change to oral. Discontinue IV fluids.  She is eating adequate.  Acute metabolic encephalopathy, in-hospital delirium: Multifactorial.  Steroids, brain mets, hospitalizations.  Nonfocal.  Clinically stabilizing.  All-time fall precautions.  Delirium precautions.  Chronic pulmonary embolism: On Eliquis.  Tolerating.  Essential hypertension blood pressure stable on metoprolol.  Hypothyroidism: On Synthroid.  Interdisciplinary Goals of Care Family Meeting  Date carried out: 11/12/2022  Location of the meeting: Bedside  Member's involved: Physician and Family Member or next of kin  Durable Power of Attorney or acting medical decision maker: daughter [name]     Code status:   Code Status: DNR    Patient arrived to [place] hospital today to start radiation of the brain to start  3/4. Patient with extensive multiple sites metastatic lesions and poor clinical status.   Whole brain radiation likely has no prognostic, survival or symptom control benefit in this patient.  She may further develop uncomfortable side effects.  This was communicated with the family. As per family discussion with other healthcare providers, they want to continue brain radiation and ultimately go home with palliative care or hospice depends upon her situation.     DVT prophylaxis:  apixaban (ELIQUIS) tablet 5 mg   Code Status: DNR Family Communication: Daughter at the bedside Disposition Plan: Status is: Inpatient Remains inpatient appropriate because: Unsafe disposition plan, inpatient procedures planned.     Consultants:  Oncology Radiation oncology Palliative care  Procedures:  None  Antimicrobials:  None   Subjective:  Patient seen and examined.  Daughter at the bedside.  Patient herself is pleasant and tells me she has no problem.  Denies any pain.  Daughter reported some confusion last night but this has been ongoing.  Objective: Vitals:   11/11/22 2108 11/12/22 0605 11/12/22 0920 11/12/22 0927  BP: 120/72 109/81 123/70 123/70  Pulse: 84 78 83 89  Resp: '16  18 18  '$ Temp: 98.3 F (36.8 C) 98.1 F (36.7 C) 98 F (36.7 C) 98 F (36.7 C)  TempSrc: Oral Oral Oral Oral  SpO2: 98% 96%  97%  Weight:      Height:        Intake/Output Summary (Last 24 hours) at 11/12/2022 1130 Last data filed at 11/12/2022 1000 Gross per 24 hour  Intake 670.8 ml  Output 1600 ml  Net -929.2 ml   Filed Weights   11/05/22 1100 11/10/22 0413  Weight: 100.7 kg 108.7  kg    Examination:  General exam: Appears calm and comfortable .  Pleasant and confused. Respiratory system: No added sounds. Cardiovascular system: S1 & S2 heard, RRR.  Bilateral edematous legs. Gastrointestinal system: Soft.  Nontender.  Bowel sounds  present. Central nervous system: Alert and awake.  Very difficult to ask orientation questions as she should find to everything.  Follows simple commands.  Moves all extremities equally but profoundly debilitated.    Data Reviewed: I have personally reviewed following labs and imaging studies  CBC: Recent Labs  Lab 11/05/22 1355 11/06/22 0327 11/07/22 0140 11/08/22 0304 11/10/22 0911  WBC  --  2.1* 2.0* 2.1* 4.0  NEUTROABS  --  1.1*  --   --   --   HGB 12.6 11.4* 10.9* 10.8* 11.4*  HCT 37.0 33.3* 30.9* 30.9* 30.9*  MCV  --  98.5 96.9 97.5 98.1  PLT  --  143* 139* 148* [id number]   Basic Metabolic Panel: Recent Labs  Lab 11/05/22 1355 11/06/22 0327 11/07/22 0140 11/08/22 0304 11/10/22 0911  NA 140 135 139 138 138  K 2.7* 3.1* 3.7 4.1 4.5  CL  --  101 110 111 112*  CO2  --  21* 20* 19* 19*  GLUCOSE  --  97 98 124* 115*  BUN  --  5* <5* 5* 10  CREATININE  --  0.87 0.81 0.80 0.80  CALCIUM  --  8.1* 8.4* 8.1* 8.5*  MG  --  2.5*  --  2.2 2.2   GFR: Estimated Creatinine Clearance: 74.1 mL/min (by C-G formula based on SCr of 0.8 mg/dL). Liver Function Tests: Recent Labs  Lab 11/08/22 0304  AST 34  ALT 17  ALKPHOS 266*  BILITOT 1.3*  PROT 5.6*  ALBUMIN 2.5*   No results for input(s): "LIPASE", "AMYLASE" in the last 168 hours.  No results for input(s): "AMMONIA" in the last 168 hours. Coagulation Profile: No results for input(s): "INR", "PROTIME" in the last 168 hours. Cardiac Enzymes: No results for input(s): "CKTOTAL", "CKMB", "CKMBINDEX", "TROPONINI" in the last 168 hours. BNP (last 3 results) No results for input(s): "PROBNP" in the last 8760 hours. HbA1C: No results for input(s): "HGBA1C" in the last 72 hours. CBG: Recent Labs  Lab 11/05/22 1140  GLUCAP 96   Lipid Profile: No results for input(s): "CHOL", "HDL", "LDLCALC", "TRIG", "CHOLHDL", "LDLDIRECT" in the last 72 hours. Thyroid Function Tests: No results for input(s): "TSH", "T4TOTAL", "FREET4",  "T3FREE", "THYROIDAB" in the last 72 hours. Anemia Panel: No results for input(s): "VITAMINB12", "FOLATE", "FERRITIN", "TIBC", "IRON", "RETICCTPCT" in the last 72 hours. Sepsis Labs: Recent Labs  Lab 11/05/22 1343  LATICACIDVEN 3.3*    Recent Results (from the past 240 hour(s))  Resp panel by RT-PCR (RSV, Flu A&B, Covid) Anterior Nasal Swab     Status: None   Collection Time: 11/05/22 12:08 PM   Specimen: Anterior Nasal Swab  Result Value Ref Range Status   SARS Coronavirus 2 by RT PCR NEGATIVE NEGATIVE Final   Influenza A by PCR NEGATIVE NEGATIVE Final   Influenza B by PCR NEGATIVE NEGATIVE Final    Comment: (NOTE) The Xpert Xpress SARS-CoV-2/FLU/RSV plus assay is intended as an aid in the diagnosis of influenza from Nasopharyngeal swab specimens and should not be used as a sole basis for treatment. Nasal washings and aspirates are unacceptable for Xpert Xpress SARS-CoV-2/FLU/RSV testing.  Fact Sheet for Patients: EntrepreneurPulse.com.au  Fact Sheet for Healthcare Providers: IncredibleEmployment.be  This test is not yet approved or cleared by  the [employer] and has been authorized for detection and/or diagnosis of SARS-CoV-2 by FDA under an Emergency Use Authorization (EUA). This EUA will remain in effect (meaning this test can be used) for the duration of the COVID-19 declaration under Section 564(b)(1) of the Act, 21 U.S.C. section 360bbb-3(b)(1), unless the authorization is terminated or revoked.     Resp Syncytial Virus by PCR NEGATIVE NEGATIVE Final    Comment: (NOTE) Fact Sheet for Patients: EntrepreneurPulse.com.au  Fact Sheet for Healthcare Providers: IncredibleEmployment.be  This test is not yet approved or cleared by the [place] FDA and has been authorized for detection and/or diagnosis of SARS-CoV-2 by FDA under an Emergency Use Authorization (EUA). This EUA will  remain in effect (meaning this test can be used) for the duration of the COVID-19 declaration under Section 564(b)(1) of the Act, 21 U.S.C. section 360bbb-3(b)(1), unless the authorization is terminated or revoked.  Performed at [facility], [place] [address], [place],  [address]          Radiology Studies: No results found.      Scheduled Meds:  apixaban  5 mg Oral BID   [START ON 11/13/2022] dexamethasone  4 mg Oral Daily   famotidine  20 mg Oral BID   feeding supplement  237 mL Oral BID BM   levothyroxine  100 mcg Oral QAC breakfast   metoprolol succinate  50 mg Oral Daily   pantoprazole  40 mg Oral Daily   polyethylene glycol  17 g Oral BID   Continuous Infusions:     LOS: 6 days    Time spent: 40 minutes    Barb Merino, MD [facility] Pager [phone]

## 2022-11-12 NOTE — Plan of Care (Signed)
Patient AOX3-4, forgetful and hearing impaired. VSS throughout shift. All meds given on time as ordered. Denied pain. Purewick in place and pt had one BM this shift. POC maintained, will continue to monitor.   Problem: Education: Goal: Knowledge of General Education information will improve Description: Including pain rating scale, medication(s)/side effects and non-pharmacologic comfort measures Outcome: Progressing   Problem: Health Behavior/Discharge Planning: Goal: Ability to manage health-related needs will improve Outcome: Progressing   Problem: Clinical Measurements: Goal: Ability to maintain clinical measurements within normal limits will improve Outcome: Progressing Goal: Will remain free from infection Outcome: Progressing Goal: Diagnostic test results will improve Outcome: Progressing Goal: Respiratory complications will improve Outcome: Progressing Goal: Cardiovascular complication will be avoided Outcome: Progressing   Problem: Activity: Goal: Risk for activity intolerance will decrease Outcome: Progressing   Problem: Nutrition: Goal: Adequate nutrition will be maintained Outcome: Progressing   Problem: Coping: Goal: Level of anxiety will decrease Outcome: Progressing   Problem: Elimination: Goal: Will not experience complications related to bowel motility Outcome: Progressing Goal: Will not experience complications related to urinary retention Outcome: Progressing   Problem: Pain Managment: Goal: General experience of comfort will improve Outcome: Progressing   Problem: Safety: Goal: Ability to remain free from injury will improve Outcome: Progressing   Problem: Skin Integrity: Goal: Risk for impaired skin integrity will decrease Outcome: Progressing

## 2022-11-12 NOTE — Progress Notes (Signed)
  Daily Progress Note   Patient Name: Sheila Jacobs       Date: 11/12/2022 DOB: 1951/08/10  Age: 72 y.o. MRN#: [id number] Attending Physician: Barb Merino, MD Primary Care Physician: Arthur Holms, NP Admit Date: 11/05/2022 Length of Stay: 6 days  Met with patient and family on 3/3. They opted to proceed with WBRT. Palliative medicine will continue to chart check at this time to determine if needing interventions for symptom management or further discussions regarding goals for medical care. Please reach out if urgent needs arise. Thank you.    Chelsea Aus, DO Palliative Care Provider PMT # [phone]

## 2022-11-13 ENCOUNTER — Ambulatory Visit
Admit: 2022-11-13 | Discharge: 2022-11-13 | Disposition: A | Discharge status: Home / Self Care | Payer: 59 | Attending: Radiation Oncology | Admitting: Radiation Oncology

## 2022-11-13 ENCOUNTER — Other Ambulatory Visit: Payer: Self-pay

## 2022-11-13 DIAGNOSIS — C50912 Malignant neoplasm of unspecified site of left female breast: Secondary | ICD-10-CM

## 2022-11-13 DIAGNOSIS — Z515 Encounter for palliative care: Secondary | ICD-10-CM | POA: Diagnosis not present

## 2022-11-13 DIAGNOSIS — Z7189 Other specified counseling: Secondary | ICD-10-CM | POA: Diagnosis not present

## 2022-11-13 DIAGNOSIS — R112 Nausea with vomiting, unspecified: Secondary | ICD-10-CM | POA: Diagnosis not present

## 2022-11-13 DIAGNOSIS — F411 Generalized anxiety disorder: Secondary | ICD-10-CM | POA: Diagnosis not present

## 2022-11-13 DIAGNOSIS — R627 Adult failure to thrive: Secondary | ICD-10-CM | POA: Diagnosis not present

## 2022-11-13 LAB — RAD ONC ARIA SESSION SUMMARY
Course Elapsed Days: 0
Plan Fractions Treated to Date: 1
Plan Prescribed Dose Per Fraction: 3 Gy
Plan Total Fractions Prescribed: 10
Plan Total Prescribed Dose: 30 Gy
Reference Point Dosage Given to Date: 3 Gy
Reference Point Session Dosage Given: 3 Gy
Session Number: 1

## 2022-11-13 NOTE — Progress Notes (Signed)
PROGRESS NOTE    Sheila Jacobs  [id number] DOB: [birth year] DOA: 11/05/2022 PCP: Arthur Holms, NP    Brief Narrative:  72 year old with history of hypertension, hyperlipidemia, GERD, obstructive sleep apnea, prior PE, metastatic breast cancer who presented to the hospital with chest pain, nausea and vomiting.  Lives at home with granddaughter.  Workup in the hospital included MRI of the brain that showed new onset multiple widespread metastatic disease.  Oncology, radiation oncology as well as palliative care were involved.  Patient with widely metastatic cancer, with multiple brain mets transferred to [facility] long hospital to start inpatient palliative radiation to the brain. Apparently patient also developed delirium, she is hard of hearing.  Still remains intermittently confused but overall stable.   Assessment & Plan:   Stage IV breast cancer, multiple metastasis to lungs, liver, bone, peritoneum and extensive brain mets: Seen by oncology and radiation oncology. Apparently she was transferred to [facility] for whole brain radiation. Palliative care following.  See goal of care discussion below. Patient currently on oral steroids. Discontinue IV fluids.  She is eating adequate.  Acute metabolic encephalopathy, in-hospital delirium: Multifactorial.  Steroids, brain mets, hospitalizations.  Nonfocal.  Clinically stabilizing.  All-time fall precautions.  Delirium precautions.  Chronic pulmonary embolism: On Eliquis.  Tolerating.  Essential hypertension blood pressure stable on metoprolol.  Hypothyroidism: On Synthroid.   Patient arrived to [facility] to start radiation of the brain to start  3/4. Patient with extensive multiple sites metastatic lesions and poor clinical status.   Whole brain radiation likely has no prognostic, survival or symptom control benefit in this patient.  She may further develop uncomfortable side effects.  This was communicated with  the family. As per family discussion with other healthcare providers, they want to continue brain radiation and ultimately go home with palliative care or hospice depends upon her situation.     DVT prophylaxis:  apixaban (ELIQUIS) tablet 5 mg   Code Status: DNR Family Communication: Daughter at the bedside Disposition Plan: Status is: Inpatient Remains inpatient appropriate because: Unsafe disposition plan, inpatient procedures planned.     Consultants:  Oncology Radiation oncology Palliative care  Procedures:  None  Antimicrobials:  None   Subjective:  Patient seen and examined.  She is pleasant.  She denies any complaints to me.  Daughter at the bedside has no questions.  She is still debating whether to take her after the radiation.  She is working out with her son and daughter.  Objective: Vitals:   11/12/22 0927 11/12/22 2000 11/13/22 0503 11/13/22 1344  BP: 123/70 132/70 138/81 (!) 150/80  Pulse: 89 76 72 78  Resp: '18 16 18 18  '$ Temp: 98 F (36.7 C) 98.2 F (36.8 C) 98 F (36.7 C) 98.3 F (36.8 C)  TempSrc: Oral Oral Oral   SpO2: 97% 98% 95% 97%  Weight:      Height:        Intake/Output Summary (Last 24 hours) at 11/13/2022 1415 Last data filed at 11/13/2022 1221 Gross per 24 hour  Intake 360 ml  Output 1950 ml  Net -1590 ml    Filed Weights   11/05/22 1100 11/10/22 0413  Weight: 100.7 kg 108.7 kg    Examination:  General exam: Appears calm and comfortable .  Hard of hearing.  Pleasant to interaction.  Slightly impulsive otherwise calm and composed. Respiratory system: No added sounds. Cardiovascular system: S1 & S2 heard, RRR.  Bilateral edematous legs. Gastrointestinal system: Soft.  Nontender.  Bowel sounds present. Central nervous system: Alert and awake.  Very difficult to ask orientation questions as she should find to everything.  Follows simple commands.  Moves all extremities equally but profoundly debilitated.    Data Reviewed: I  have personally reviewed following labs and imaging studies  CBC: Recent Labs  Lab 11/07/22 0140 11/08/22 0304 11/10/22 0911  WBC 2.0* 2.1* 4.0  HGB 10.9* 10.8* 11.4*  HCT 30.9* 30.9* 30.9*  MCV 96.9 97.5 98.1  PLT 139* 148* [id number]    Basic Metabolic Panel: Recent Labs  Lab 11/07/22 0140 11/08/22 0304 11/10/22 0911  NA 139 138 138  K 3.7 4.1 4.5  CL 110 111 112*  CO2 20* 19* 19*  GLUCOSE 98 124* 115*  BUN <5* 5* 10  CREATININE 0.81 0.80 0.80  CALCIUM 8.4* 8.1* 8.5*  MG  --  2.2 2.2    GFR: Estimated Creatinine Clearance: 74.1 mL/min (by C-G formula based on SCr of 0.8 mg/dL). Liver Function Tests: Recent Labs  Lab 11/08/22 0304  AST 34  ALT 17  ALKPHOS 266*  BILITOT 1.3*  PROT 5.6*  ALBUMIN 2.5*    No results for input(s): "LIPASE", "AMYLASE" in the last 168 hours.  No results for input(s): "AMMONIA" in the last 168 hours. Coagulation Profile: No results for input(s): "INR", "PROTIME" in the last 168 hours. Cardiac Enzymes: No results for input(s): "CKTOTAL", "CKMB", "CKMBINDEX", "TROPONINI" in the last 168 hours. BNP (last 3 results) No results for input(s): "PROBNP" in the last 8760 hours. HbA1C: No results for input(s): "HGBA1C" in the last 72 hours. CBG: No results for input(s): "GLUCAP" in the last 168 hours.  Lipid Profile: No results for input(s): "CHOL", "HDL", "LDLCALC", "TRIG", "CHOLHDL", "LDLDIRECT" in the last 72 hours. Thyroid Function Tests: No results for input(s): "TSH", "T4TOTAL", "FREET4", "T3FREE", "THYROIDAB" in the last 72 hours. Anemia Panel: No results for input(s): "VITAMINB12", "FOLATE", "FERRITIN", "TIBC", "IRON", "RETICCTPCT" in the last 72 hours. Sepsis Labs: No results for input(s): "PROCALCITON", "LATICACIDVEN" in the last 168 hours.   Recent Results (from the past 240 hour(s))  Resp panel by RT-PCR (RSV, Flu A&B, Covid) Anterior Nasal Swab     Status: None   Collection Time: 11/05/22 12:08 PM   Specimen: Anterior  Nasal Swab  Result Value Ref Range Status   SARS Coronavirus 2 by RT PCR NEGATIVE NEGATIVE Final   Influenza A by PCR NEGATIVE NEGATIVE Final   Influenza B by PCR NEGATIVE NEGATIVE Final    Comment: (NOTE) The Xpert Xpress SARS-CoV-2/FLU/RSV plus assay is intended as an aid in the diagnosis of influenza from Nasopharyngeal swab specimens and should not be used as a sole basis for treatment. Nasal washings and aspirates are unacceptable for Xpert Xpress SARS-CoV-2/FLU/RSV testing.  Fact Sheet for Patients: EntrepreneurPulse.com.au  Fact Sheet for Healthcare Providers: IncredibleEmployment.be  This test is not yet approved or cleared by the [place] FDA and has been authorized for detection and/or diagnosis of SARS-CoV-2 by FDA under an Emergency Use Authorization (EUA). This EUA will remain in effect (meaning this test can be used) for the duration of the COVID-19 declaration under Section 564(b)(1) of the Act, 21 U.S.C. section 360bbb-3(b)(1), unless the authorization is terminated or revoked.     Resp Syncytial Virus by PCR NEGATIVE NEGATIVE Final    Comment: (NOTE) Fact Sheet for Patients: EntrepreneurPulse.com.au  Fact Sheet for Healthcare Providers: IncredibleEmployment.be  This test is not yet approved or cleared by the [place] and has been authorized for  detection and/or diagnosis of SARS-CoV-2 by FDA under an Emergency Use Authorization (EUA). This EUA will remain in effect (meaning this test can be used) for the duration of the COVID-19 declaration under Section 564(b)(1) of the Act, 21 U.S.C. section 360bbb-3(b)(1), unless the authorization is terminated or revoked.  Performed at [facility], [place] [address], [place], [place] [address]          Radiology Studies: No results found.      Scheduled Meds:  apixaban  5 mg Oral BID   dexamethasone  4 mg Oral Daily    famotidine  20 mg Oral BID   feeding supplement  237 mL Oral BID BM   levothyroxine  100 mcg Oral QAC breakfast   metoprolol succinate  50 mg Oral Daily   pantoprazole  40 mg Oral Daily   polyethylene glycol  17 g Oral BID   Continuous Infusions:     LOS: 7 days    Time spent: 40 minutes    Barb Merino, MD [facility] Pager [phone]

## 2022-11-13 NOTE — Plan of Care (Signed)
Patient AOX3-4, forgetful and hearing impaired. VSS throughout shift. All meds given on time as ordered. Denied pain. Purewick in place. POC maintained, will continue to monitor.  Problem: Education: Goal: Knowledge of General Education information will improve Description: Including pain rating scale, medication(s)/side effects and non-pharmacologic comfort measures Outcome: Progressing   Problem: Health Behavior/Discharge Planning: Goal: Ability to manage health-related needs will improve Outcome: Progressing   Problem: Clinical Measurements: Goal: Ability to maintain clinical measurements within normal limits will improve Outcome: Progressing Goal: Will remain free from infection Outcome: Progressing Goal: Diagnostic test results will improve Outcome: Progressing Goal: Respiratory complications will improve Outcome: Progressing Goal: Cardiovascular complication will be avoided Outcome: Progressing   Problem: Activity: Goal: Risk for activity intolerance will decrease Outcome: Progressing   Problem: Nutrition: Goal: Adequate nutrition will be maintained Outcome: Progressing   Problem: Coping: Goal: Level of anxiety will decrease Outcome: Progressing   Problem: Elimination: Goal: Will not experience complications related to bowel motility Outcome: Progressing Goal: Will not experience complications related to urinary retention Outcome: Progressing   Problem: Pain Managment: Goal: General experience of comfort will improve Outcome: Progressing   Problem: Safety: Goal: Ability to remain free from injury will improve Outcome: Progressing   Problem: Skin Integrity: Goal: Risk for impaired skin integrity will decrease Outcome: Progressing

## 2022-11-13 NOTE — Progress Notes (Signed)
  Daily Progress Note   Patient Name: Sheila Jacobs       Date: 11/13/2022 DOB: [birth year]  Age: 72 y.o. MRN#: [id number] Attending Physician: Barb Merino, MD Primary Care Physician: Arthur Holms, NP Admit Date: 11/05/2022 Length of Stay: 7 days  Reason for Consultation/Follow-up: Establishing goals of care  Subjective:   CC: Following up regarding goals for medical care.   Subjective:  Patient tolerated radiation well, back from her radiation, appears weak, resting in bed, family at bedside.   Denies pain.  Patient is to continue whole brain radiation with hope that it will allow patient to have more time.    Palliative medicine team will continue to follow along and should patient decide no longer to proceed with whole brain radiation, that home hospice support could be pursued.  Questions answered at that time.  Provided emotional support via active listening.    Objective:   Vital Signs:  BP (!) 150/80 (BP Location: Left Leg)   Pulse 78   Temp 98.3 F (36.8 C)   Resp 18   Ht '5\' 3"'$  (1.6 m)   Wt 108.7 kg   SpO2 97%   BMI 42.45 kg/m   Physical Exam: General: NAD, awake, no distress Eyes: no drainage noted HENT: moist mucous membranes Cardiovascular: RRR Respiratory: no increased work of breathing noted, not in respiratory distress Abdomen: not distended Skin: no rashes or lesions on visible skin Neuro: awake, HOH  Imaging:  I personally reviewed recent imaging.   Assessment & Plan:   Assessment: 72 y.o. female  with past medical history of HTN, HLD, GERD, goiter, OSA, prior PE, arthritis, and metastatic breast cancer admitted on 11/05/2022 with chest pain, nausea and vomiting.    Recommendations/Plan: # Complex medical decision making/goals of care:  - Patient and daughter to proceed with whole brain radiation.  Should patient have adverse effects from radiation, daughter noted we will discontinue at that time.  Patient plan for 10 rounds of whole brain  radiation.  Will continue with DNR/DNI status.  Patient will continue to receive palliative support while receiving radiation.  Can transition to hospice care once radiation completed if daughter determines this is best support for them as a family.  -  Code Status: DNR  # Symptom management:  -As per primary team  # Discharge Planning: Completing WBRT. Palliative supporting. Potential hospice once WBRT completed.   Discussed with: patient, family  Thank you for allowing the palliative care team to participate in the care Makiyla F Pryor.  [place] MD Palliative Care Provider PMT # [phone]  This provider did a review of EMR, discussing care with other staff members involved in patient's medical care, obtaining relevant history and information from patient and/or patient's family, and personal review of imaging and lab work. Greater than 50% of the time was spent counseling and coordinating care related to the above assessment and plan.

## 2022-11-13 NOTE — Progress Notes (Signed)
RN Anderson Malta called pt floor nurse to verify that pt was stable for treatment this am. Rn stated that pt was doing well and should be able to get treatment today. L4 also called to let them know.

## 2022-11-14 ENCOUNTER — Other Ambulatory Visit: Payer: Self-pay

## 2022-11-14 ENCOUNTER — Ambulatory Visit
Admit: 2022-11-14 | Discharge: 2022-11-14 | Disposition: A | Discharge status: Home / Self Care | Payer: 59 | Attending: Radiation Oncology | Admitting: Radiation Oncology

## 2022-11-14 DIAGNOSIS — C50412 Malignant neoplasm of upper-outer quadrant of left female breast: Secondary | ICD-10-CM | POA: Diagnosis not present

## 2022-11-14 DIAGNOSIS — R531 Weakness: Secondary | ICD-10-CM | POA: Diagnosis not present

## 2022-11-14 DIAGNOSIS — Z7189 Other specified counseling: Secondary | ICD-10-CM | POA: Diagnosis not present

## 2022-11-14 DIAGNOSIS — Z170 Estrogen receptor positive status [ER+]: Secondary | ICD-10-CM | POA: Diagnosis not present

## 2022-11-14 DIAGNOSIS — R112 Nausea with vomiting, unspecified: Secondary | ICD-10-CM | POA: Diagnosis not present

## 2022-11-14 DIAGNOSIS — Z86711 Personal history of pulmonary embolism: Secondary | ICD-10-CM | POA: Diagnosis not present

## 2022-11-14 DIAGNOSIS — Z515 Encounter for palliative care: Secondary | ICD-10-CM | POA: Diagnosis not present

## 2022-11-14 LAB — RAD ONC ARIA SESSION SUMMARY
Course Elapsed Days: 1
Plan Fractions Treated to Date: 2
Plan Prescribed Dose Per Fraction: 3 Gy
Plan Total Fractions Prescribed: 10
Plan Total Prescribed Dose: 30 Gy
Reference Point Dosage Given to Date: 6 Gy
Reference Point Session Dosage Given: 3 Gy
Session Number: 2

## 2022-11-14 NOTE — Progress Notes (Signed)
  Daily Progress Note   Patient Name: Sheila Jacobs       Date: 11/14/2022 DOB: 1951-01-05  Age: 72 y.o. MRN#: [id number] Attending Physician: Aline August, MD Primary Care Physician: Arthur Holms, NP Admit Date: 11/05/2022 Length of Stay: 8 days  Reason for Consultation/Follow-up: Establishing goals of care  Subjective:   CC: Following up regarding goals for medical care.   Subjective:  Patient is undergoing radiation daily, in no distress, decent PO intake as per family at bedside.    Patient is to continue whole brain radiation with hope that it will allow patient to have more time.    Palliative medicine team will continue to follow along and should patient decide no longer to proceed with whole brain radiation, that home hospice support could be pursued.  Questions answered at this time.  Provided emotional support via active listening.    Objective:   Vital Signs:  BP 123/72 (BP Location: Right Arm)   Pulse 73   Temp 98.2 F (36.8 C) (Oral)   Resp 16   Ht '5\' 3"'$  (1.6 m)   Wt 108.7 kg   SpO2 94%   BMI 42.45 kg/m   Physical Exam: General: NAD, awakens easily, appears with generalized weakness.  Eyes: no drainage noted HENT: moist mucous membranes Cardiovascular: RRR Respiratory: no increased work of breathing noted, not in respiratory distress Abdomen: not distended Skin: no rashes or lesions on visible skin Neuro: awake, HOH  Imaging:  I personally reviewed recent imaging.   Assessment & Plan:   Assessment: 72 y.o. female  with past medical history of HTN, HLD, GERD, goiter, OSA, prior PE, arthritis, and metastatic breast cancer admitted on 11/05/2022 with chest pain, nausea and vomiting.    Recommendations/Plan: # Complex medical decision making/goals of care:  - Patient and daughter elected to proceed with whole brain radiation.  Should patient have adverse effects from radiation, daughter noted we will discontinue at that time.  Patient plan for 10  rounds of whole brain radiation.  Will continue with DNR/DNI status.  Patient will continue to receive palliative support while receiving radiation.  Can transition to hospice care once radiation completed if daughter determines this is best support for them as a family.  -  Code Status: DNR  # Symptom management:  -As per primary team  # Discharge Planning: Completing WBRT. Palliative supporting. Potential hospice once WBRT completed.   Discussed with: patient, family  Thank you for allowing the palliative care team to participate in the care Eudelia F Loomer.  [place] MD Palliative Care Provider PMT # [phone]  This provider did a review of EMR, discussing care with other staff members involved in patient's medical care, obtaining relevant history and information from patient and/or patient's family, and personal review of imaging and lab work. Greater than 50% of the time was spent counseling and coordinating care related to the above assessment and plan.

## 2022-11-14 NOTE — Progress Notes (Signed)
PROGRESS NOTE    Sheila Jacobs  [id number] DOB: [birth year] DOA: 11/05/2022 PCP: Arthur Holms, NP   Brief Narrative:  72 year old with history of hypertension, hyperlipidemia, GERD, obstructive sleep apnea, prior PE, metastatic breast cancer presented to the hospital with chest pain, nausea and vomiting. Workup in the hospital included MRI of the brain that showed new onset multiple widespread metastatic disease. Oncology, radiation oncology as well as palliative care were involved. Patient with widely metastatic cancer, with multiple brain mets transferred to [facility] long hospital to start inpatient palliative radiation to the brain.  She subsequently also developed delirium with intermittent confusion.  Assessment & Plan: Stage IV breast cancer, multiple metastasis to lungs, liver, bone, peritoneum and extensive brain mets  Goals of care -Seen by oncology and radiation oncology. -she was transferred to [facility] long hospital for whole brain radiation.  Currently undergoing whole brain radiation (total of 10 treatments) -Prognosis is very poor.  Palliative care following: Patient/family want to continue radiation treatment for now and if no improvement, they are contemplating home hospice.  Patient currently on oral steroids.  Acute metabolic encephalopathy/delirium -Multifactorial secondary to brain mets/steroids/poor clinical status -Improving.  Still intermittently confused but more awake and responsive. -Monitor mental status.  Fall precautions.  Delirium precautions.  Chronic pulmonary embolism -Continue Eliquis.  Essential hypertension -Blood pressure stable on metoprolol succinate  Hypothyroidism -Continue Synthroid  Morbid obesity -Outpatient follow-up  DVT prophylaxis: Eliquis Code Status: DNR Family Communication: Daughter at bedside Disposition Plan: Status is: Inpatient Remains inpatient appropriate because: Of severity of illness.  Need for radiation  treatment  Consultants: Oncology/radiation oncology/palliative care  Procedures: None  Antimicrobials: None   Subjective: Patient seen and examined at bedside.  Complains of shortness of breath and cough.  No fever, vomiting, seizures reported.  Daughter present at bedside states that patient is intermittently confused.  Objective: Vitals:   11/13/22 1344 11/13/22 2032 11/14/22 0513 11/14/22 0904  BP: (!) 150/80 121/70 123/72   Pulse: 78 80 73   Resp: '18 16 16   '$ Temp: 98.3 F (36.8 C) 98 F (36.7 C) 98.2 F (36.8 C)   TempSrc:  Oral Oral   SpO2: 97% 95% 95% 94%  Weight:      Height:        Intake/Output Summary (Last 24 hours) at 11/14/2022 1029 Last data filed at 11/14/2022 1000 Gross per 24 hour  Intake 480 ml  Output 2700 ml  Net -2220 ml   Filed Weights   11/05/22 1100 11/10/22 0413  Weight: 100.7 kg 108.7 kg    Examination:  General exam: Appears calm and comfortable.  Looks chronically ill and deconditioned.  On room air.  Slow to respond.  Poor historian. Respiratory system: Bilateral decreased breath sounds at bases with some scattered crackles Cardiovascular system: S1 & S2 heard, Rate controlled Gastrointestinal system: Abdomen is morbidly obese, nondistended, soft and nontender. Normal bowel sounds heard. Extremities: No cyanosis, clubbing; trace lower extremity edema   Data Reviewed: I have personally reviewed following labs and imaging studies  CBC: Recent Labs  Lab 11/08/22 0304 11/10/22 0911  WBC 2.1* 4.0  HGB 10.8* 11.4*  HCT 30.9* 30.9*  MCV 97.5 98.1  PLT 148* [id number]   Basic Metabolic Panel: Recent Labs  Lab 11/08/22 0304 11/10/22 0911  NA 138 138  K 4.1 4.5  CL 111 112*  CO2 19* 19*  GLUCOSE 124* 115*  BUN 5* 10  CREATININE 0.80 0.80  CALCIUM 8.1* 8.5*  MG 2.2 2.2  GFR: Estimated Creatinine Clearance: 74.1 mL/min (by C-G formula based on SCr of 0.8 mg/dL). Liver Function Tests: Recent Labs  Lab 11/08/22 0304  AST 34  ALT  17  ALKPHOS 266*  BILITOT 1.3*  PROT 5.6*  ALBUMIN 2.5*   No results for input(s): "LIPASE", "AMYLASE" in the last 168 hours. No results for input(s): "AMMONIA" in the last 168 hours. Coagulation Profile: No results for input(s): "INR", "PROTIME" in the last 168 hours. Cardiac Enzymes: No results for input(s): "CKTOTAL", "CKMB", "CKMBINDEX", "TROPONINI" in the last 168 hours. BNP (last 3 results) No results for input(s): "PROBNP" in the last 8760 hours. HbA1C: No results for input(s): "HGBA1C" in the last 72 hours. CBG: No results for input(s): "GLUCAP" in the last 168 hours. Lipid Profile: No results for input(s): "CHOL", "HDL", "LDLCALC", "TRIG", "CHOLHDL", "LDLDIRECT" in the last 72 hours. Thyroid Function Tests: No results for input(s): "TSH", "T4TOTAL", "FREET4", "T3FREE", "THYROIDAB" in the last 72 hours. Anemia Panel: No results for input(s): "VITAMINB12", "FOLATE", "FERRITIN", "TIBC", "IRON", "RETICCTPCT" in the last 72 hours. Sepsis Labs: No results for input(s): "PROCALCITON", "LATICACIDVEN" in the last 168 hours.  Recent Results (from the past 240 hour(s))  Resp panel by RT-PCR (RSV, Flu A&B, Covid) Anterior Nasal Swab     Status: None   Collection Time: 11/05/22 12:08 PM   Specimen: Anterior Nasal Swab  Result Value Ref Range Status   SARS Coronavirus 2 by RT PCR NEGATIVE NEGATIVE Final   Influenza A by PCR NEGATIVE NEGATIVE Final   Influenza B by PCR NEGATIVE NEGATIVE Final    Comment: (NOTE) The Xpert Xpress SARS-CoV-2/FLU/RSV plus assay is intended as an aid in the diagnosis of influenza from Nasopharyngeal swab specimens and should not be used as a sole basis for treatment. Nasal washings and aspirates are unacceptable for Xpert Xpress SARS-CoV-2/FLU/RSV testing.  Fact Sheet for Patients: EntrepreneurPulse.com.au  Fact Sheet for Healthcare Providers: IncredibleEmployment.be  This test is not yet approved or cleared by  the [place] FDA and has been authorized for detection and/or diagnosis of SARS-CoV-2 by FDA under an Emergency Use Authorization (EUA). This EUA will remain in effect (meaning this test can be used) for the duration of the COVID-19 declaration under Section 564(b)(1) of the Act, 21 U.S.C. section 360bbb-3(b)(1), unless the authorization is terminated or revoked.     Resp Syncytial Virus by PCR NEGATIVE NEGATIVE Final    Comment: (NOTE) Fact Sheet for Patients: EntrepreneurPulse.com.au  Fact Sheet for Healthcare Providers: IncredibleEmployment.be  This test is not yet approved or cleared by the [place] FDA and has been authorized for detection and/or diagnosis of SARS-CoV-2 by FDA under an Emergency Use Authorization (EUA). This EUA will remain in effect (meaning this test can be used) for the duration of the COVID-19 declaration under Section 564(b)(1) of the Act, 21 U.S.C. section 360bbb-3(b)(1), unless the authorization is terminated or revoked.  Performed at [facility], [place] [address] [place], [place] [address]          Radiology Studies: No results found.      Scheduled Meds:  apixaban  5 mg Oral BID   dexamethasone  4 mg Oral Daily   famotidine  20 mg Oral BID   feeding supplement  237 mL Oral BID BM   levothyroxine  100 mcg Oral QAC breakfast   metoprolol succinate  50 mg Oral Daily   pantoprazole  40 mg Oral Daily   polyethylene glycol  17 g Oral BID   Continuous  Infusions:        Aline August, MD [facility] 11/14/2022, 10:29 AM

## 2022-11-14 NOTE — Progress Notes (Signed)
RN Anderson Malta called pt's nurse [name] to ensure pt is good to for treatment today. She said the pt was good to go for treatment today. Rn will also let L2 know as well.

## 2022-11-14 NOTE — Progress Notes (Signed)
[name] arrived to the desk to ask about the HIPAA form to be signed by her grandmother.  [name], RN explained to the granddaughter that she went to speak with the patient and the patient verbalized that she only wanted [name] knowing her medical information.  [name] and I went together to the patients room after chart review and noted the patient to be intermittently confused.  Per the documentation within the demographics section of the chart, the DPR (Designated Patient Representative) was [name].  I asked the granddaughter to speak with [name], her mother to get any updates on her grandmother.  Sheila Jacobs explained that she was trying to get FMLA for her job.  I shared that ideally she would not send her grandmother's medical information to her job, but request the FMLA forms from them and then the physician could complete them on her behalf if they found that fitting for this situation. She was given the fax number to the department as a resource.

## 2022-11-14 NOTE — Progress Notes (Signed)
Physical Therapy Treatment Patient Details Name: Sheila Jacobs MRN: [id number] DOB: 03/21/1951 Today's Date: 11/14/2022   History of Present Illness 72 year old female sent to the ED for evaluation of chest pain, nausea and vomiting; admitted 2/24. PMHX: past medical history significant for hypertension, hyperlipidemia, GERD, goiter, OSA, prior PE, arthritis, metastatic breast cancer.    PT Comments    Pt agreeable to therapy, able to follow written commands. Attempted to cue pt on pushing from seated surface to power up, but unable to cue. Pt prefers to pull on therapist's hands to pull self up into standing, able to pivot to recliner with min guard. Pt amb 10 ft with RW, slow cadence, recliner follow for safety, no knee buckling or near falls, +2 for safety. Pt with noted dyspnea, HR in 80s and SpO2 92%, notified RN. Pt tolerates remaining up in recliner at EOS with grandson preparing lunch tray for pt.    Recommendations for follow up therapy are one component of a multi-disciplinary discharge planning process, led by the attending physician.  Recommendations may be updated based on patient status, additional functional criteria and insurance authorization.  Follow Up Recommendations  Home health PT     Assistance Recommended at Discharge Frequent or constant Supervision/Assistance  Patient can return home with the following A lot of help with walking and/or transfers;A lot of help with bathing/dressing/bathroom   Equipment Recommendations  None recommended by PT    Recommendations for Other Services       Precautions / Restrictions Precautions Precautions: Fall Precaution Comments: Very HOH; writing notes to communicate clearly Restrictions Weight Bearing Restrictions: No     Mobility  Bed Mobility Overal bed mobility: Needs Assistance Bed Mobility: Supine to Sit  Supine to sit: Mod assist, HOB elevated  General bed mobility comments: Mod assist with tactile cues to  transfer to edge of bed. Patient using bed rails to assist.    Transfers Overall transfer level: Needs assistance Equipment used: Rolling walker (2 wheels) Transfers: Sit to/from Stand, Bed to chair/wheelchair/BSC Sit to Stand: Min assist, From elevated surface, +2 safety/equipment  Step pivot transfers: Min guard  General transfer comment: minA +2 for safety to power up to stand, pt prefers to pull on therapist's hands to upright self into standing, min guard to step pivot to recliner at bedside    Ambulation/Gait Ambulation/Gait assistance: Min guard, +2 safety/equipment Gait Distance (Feet): 10 Feet Assistive device: Rolling walker (2 wheels) Gait Pattern/deviations: Step-through pattern, Decreased stride length Gait velocity: decreased  General Gait Details: slow, step through gait pattern, increased lateral weight shifting, min guard+2 with recliner follow, no BLE buckling noted, dyspnea noted with HR in the 80s and SpO2 92% on RA   Stairs             Wheelchair Mobility    Modified Rankin (Stroke Patients Only)       Balance Overall balance assessment: Needs assistance Sitting-balance support: No upper extremity supported, Feet supported Sitting balance-Leahy Scale: Fair  Standing balance support: Reliant on assistive device for balance, During functional activity, Bilateral upper extremity supported Standing balance-Leahy Scale: Poor     Cognition Arousal/Alertness: Awake/alert Behavior During Therapy: WFL for tasks assessed/performed Overall Cognitive Status: Difficult to assess  General Comments: follow written commands        Exercises      General Comments        Pertinent Vitals/Pain Pain Assessment Pain Assessment: No/denies pain    Home Living  Prior Function            PT Goals (current goals can now be found in the care plan section) Acute Rehab PT Goals Patient Stated Goal: Did not state, but  agreeable to getting up PT Goal Formulation: Patient unable to participate in goal setting Time For Goal Achievement: 11/21/22 Potential to Achieve Goals: Good Progress towards PT goals: Progressing toward goals    Frequency    Min 3X/week      PT Plan Current plan remains appropriate    Co-evaluation              AM-PAC PT "6 Clicks" Mobility   Outcome Measure  Help needed turning from your back to your side while in a flat bed without using bedrails?: A Lot Help needed moving from lying on your back to sitting on the side of a flat bed without using bedrails?: A Lot Help needed moving to and from a bed to a chair (including a wheelchair)?: A Little Help needed standing up from a chair using your arms (e.g., wheelchair or bedside chair)?: A Little Help needed to walk in hospital room?: A Lot Help needed climbing 3-5 steps with a railing? : Total 6 Click Score: 13    End of Session   Activity Tolerance: Patient limited by fatigue Patient left: in chair;with call bell/phone within reach;with family/visitor present Nurse Communication: Mobility status PT Visit Diagnosis: Other abnormalities of gait and mobility (R26.89);Muscle weakness (generalized) (M62.81)     Time: 1441-1500 PT Time Calculation (min) (ACUTE ONLY): 19 min  Charges:  $Therapeutic Activity: 8-22 mins                      Tori Malky Rudzinski PT, DPT 11/14/22, 3:29 PM

## 2022-11-14 NOTE — Plan of Care (Signed)
Patients grandaughter Sheila Jacobs called to have patient fill out HIPPA form. When I went to verify if patient would like to, she stated only her daughter Sheila Jacobs to have access to medical records and information.   Problem: Education: Goal: Knowledge of General Education information will improve Description: Including pain rating scale, medication(s)/side effects and non-pharmacologic comfort measures Outcome: Progressing   Problem: Health Behavior/Discharge Planning: Goal: Ability to manage health-related needs will improve Outcome: Progressing   Problem: Clinical Measurements: Goal: Ability to maintain clinical measurements within normal limits will improve Outcome: Progressing Goal: Will remain free from infection Outcome: Progressing Goal: Diagnostic test results will improve Outcome: Progressing Goal: Respiratory complications will improve Outcome: Progressing Goal: Cardiovascular complication will be avoided Outcome: Progressing   Problem: Activity: Goal: Risk for activity intolerance will decrease Outcome: Progressing   Problem: Nutrition: Goal: Adequate nutrition will be maintained Outcome: Progressing   Problem: Coping: Goal: Level of anxiety will decrease Outcome: Progressing   Problem: Elimination: Goal: Will not experience complications related to bowel motility Outcome: Progressing Goal: Will not experience complications related to urinary retention Outcome: Progressing   Problem: Pain Managment: Goal: General experience of comfort will improve Outcome: Progressing   Problem: Safety: Goal: Ability to remain free from injury will improve Outcome: Progressing   Problem: Skin Integrity: Goal: Risk for impaired skin integrity will decrease Outcome: Progressing

## 2022-11-14 NOTE — Progress Notes (Signed)
Occupational Therapy Treatment Patient Details Name: Sheila Jacobs MRN: [id number] DOB: 06-22-1951 Today's Date: 11/14/2022   History of present illness 72 year old female sent to the ED for evaluation of chest pain, nausea and vomiting; admitted 2/24. PMHX: past medical history significant for hypertension, hyperlipidemia, GERD, goiter, OSA, prior PE, arthritis, metastatic breast cancer.   OT comments  Co-treat with PT to work with getting patient OOB due to patient's poor performance last treatment. Today patient mod assist to transfer to edge of bed and min assist to stand. Patient able to take steps to recliner and then ambulate forward from recliner. +2 for safety. All instructions had to be written or visual cues. She did demonstrate improved ability today. Tentative plan is still for discharge home with family.    Recommendations for follow up therapy are one component of a multi-disciplinary discharge planning process, led by the attending physician.  Recommendations may be updated based on patient status, additional functional criteria and insurance authorization.    Follow Up Recommendations  Home health OT     Assistance Recommended at Discharge Intermittent Supervision/Assistance  Patient can return home with the following  A little help with walking and/or transfers;A lot of help with bathing/dressing/bathroom;Assistance with cooking/housework;Assist for transportation;Help with stairs or ramp for entrance   Equipment Recommendations  Hospital bed;Wheelchair (measurements OT);Wheelchair cushion (measurements OT);BSC/3in1    Recommendations for Other Services      Precautions / Restrictions Precautions Precautions: Fall Precaution Comments: Very HOH; writing notes to communicate clearly Restrictions Weight Bearing Restrictions: No       Mobility Bed Mobility Overal bed mobility: Needs Assistance Bed Mobility: Supine to Sit     Supine to sit: Mod assist, HOB  elevated     General bed mobility comments: Mod assist with tactile cues to transfer to edge of bed. Patient using bed rails to assist.    Transfers Overall transfer level: Needs assistance Equipment used: Rolling walker (2 wheels) Transfers: Sit to/from Stand, Bed to chair/wheelchair/BSC Sit to Stand: Min assist, From elevated surface     Step pivot transfers: Min guard           Balance Overall balance assessment: Needs assistance Sitting-balance support: No upper extremity supported, Feet supported Sitting balance-Leahy Scale: Fair     Standing balance support: Reliant on assistive device for balance Standing balance-Leahy Scale: Poor                             ADL either performed or assessed with clinical judgement   ADL   Eating/Feeding: Set up;Sitting                                     General ADL Comments: Treatment focused on getting patient out of the bed and moving to progress some ADLs out of bed.    Extremity/Trunk Assessment              Vision   Vision Assessment?: No apparent visual deficits   Perception     Praxis      Cognition Arousal/Alertness: Awake/alert Behavior During Therapy: WFL for tasks assessed/performed Overall Cognitive Status: Difficult to assess                                 General Comments: follow written commands  Exercises      Shoulder Instructions       General Comments      Pertinent Vitals/ Pain       Pain Assessment Pain Assessment: No/denies pain  Home Living                                          Prior Functioning/Environment              Frequency  Min 2X/week        Progress Toward Goals  OT Goals(current goals can now be found in the care plan section)  Progress towards OT goals: OT to reassess next treatment  Acute Rehab OT Goals Patient Stated Goal: get stronger OT Goal Formulation: With family Time  For Goal Achievement: 11/21/22 Potential to Achieve Goals: [place] Discharge plan remains appropriate    Co-evaluation                 AM-PAC OT "6 Clicks" Daily Activity     Outcome Measure   Help from another person eating meals?: None Help from another person taking care of personal grooming?: A Little Help from another person toileting, which includes using toliet, bedpan, or urinal?: A Little Help from another person bathing (including washing, rinsing, drying)?: A Lot Help from another person to put on and taking off regular upper body clothing?: A Little Help from another person to put on and taking off regular lower body clothing?: Total 6 Click Score: 16    End of Session Equipment Utilized During Treatment: Rolling walker (2 wheels)  OT Visit Diagnosis: Unsteadiness on feet (R26.81);Muscle weakness (generalized) (M62.81);Other abnormalities of gait and mobility (R26.89);Pain Pain - Right/Left: Left Pain - part of body: Shoulder   Activity Tolerance Patient tolerated treatment well   Patient Left in chair;with call bell/phone within reach;with family/visitor present   Nurse Communication Mobility status        Time: 1442-1500 OT Time Calculation (min): 18 min  Charges: OT General Charges $OT Visit: 1 Visit (no charge)  Gustavo Lah, OTR/L [place]  Office [phone]   Lenward Chancellor 11/14/2022, 3:10 PM

## 2022-11-15 ENCOUNTER — Other Ambulatory Visit: Payer: Self-pay

## 2022-11-15 ENCOUNTER — Ambulatory Visit
Admit: 2022-11-15 | Discharge: 2022-11-15 | Disposition: A | Discharge status: Home / Self Care | Payer: 59 | Attending: Radiation Oncology | Admitting: Radiation Oncology

## 2022-11-15 DIAGNOSIS — Z86711 Personal history of pulmonary embolism: Secondary | ICD-10-CM | POA: Diagnosis not present

## 2022-11-15 DIAGNOSIS — Z515 Encounter for palliative care: Secondary | ICD-10-CM | POA: Diagnosis not present

## 2022-11-15 DIAGNOSIS — R112 Nausea with vomiting, unspecified: Secondary | ICD-10-CM | POA: Diagnosis not present

## 2022-11-15 LAB — RAD ONC ARIA SESSION SUMMARY
Course Elapsed Days: 2
Plan Fractions Treated to Date: 3
Plan Prescribed Dose Per Fraction: 3 Gy
Plan Total Fractions Prescribed: 10
Plan Total Prescribed Dose: 30 Gy
Reference Point Dosage Given to Date: 9 Gy
Reference Point Session Dosage Given: 3 Gy
Session Number: 3

## 2022-11-15 NOTE — Progress Notes (Signed)
  Daily Progress Note   Patient Name: Sheila Jacobs       Date: 11/15/2022 DOB: 1950/10/26  Age: 72 y.o. MRN#: [id number] Attending Physician: Aline August, MD Primary Care Physician: Arthur Holms, NP Admit Date: 11/05/2022 Length of Stay: 9 days  Reason for Consultation/Follow-up: Establishing goals of care  Subjective:   CC: Following up regarding goals for medical care.   Subjective:  Patient is undergoing radiation daily, in no distress, appears with ongoing fatigue and weakness.    Patient is to continue whole brain radiation with hope that it will allow patient to have more time.    Palliative medicine team will continue to follow along and should patient decide no longer to proceed with whole brain radiation, that home hospice support could be pursued.  Questions answered at this time.  Provided emotional support via active listening.    Objective:   Vital Signs:  BP 105/82 (BP Location: Right Arm)   Pulse 84   Temp 98.2 F (36.8 C) (Oral)   Resp 16   Ht '5\' 3"'$  (1.6 m)   Wt 108.7 kg   SpO2 96%   BMI 42.45 kg/m   Physical Exam: General: NAD, awakens easily, appears with generalized weakness.  Eyes: no drainage noted HENT: moist mucous membranes Cardiovascular: RRR Respiratory: no increased work of breathing noted, not in respiratory distress Abdomen: not distended Skin: no rashes or lesions on visible skin Neuro: awake, HOH  Imaging:  I personally reviewed recent imaging.   Assessment & Plan:   Assessment: 72 y.o. female  with past medical history of HTN, HLD, GERD, goiter, OSA, prior PE, arthritis, and metastatic breast cancer admitted on 11/05/2022 with chest pain, nausea and vomiting.    Recommendations/Plan: # Complex medical decision making/goals of care:  - Patient and daughter elected to proceed with whole brain radiation.  Should patient have adverse effects from radiation, daughter noted we will discontinue at that time.  Patient plan for 10  rounds of whole brain radiation.  Will continue with DNR/DNI status.  Patient will continue to receive palliative support while receiving radiation.  Can transition to hospice care once radiation completed if daughter determines this is best support for them as a family.  -  Code Status: DNR  # Symptom management:  -As per primary team  # Discharge Planning: Completing WBRT. Palliative supporting. Potential hospice once WBRT completed.   Discussed with: patient, family  Thank you for allowing the palliative care team to participate in the care Sheila Jacobs.  Augusta MD Palliative Care Provider PMT # [phone]  This provider did a review of EMR, discussing care with other staff members involved in patient's medical care, obtaining relevant history and information from patient and/or patient's family, and personal review of imaging and lab work. Greater than 50% of the time was spent counseling and coordinating care related to the above assessment and plan.

## 2022-11-15 NOTE — Progress Notes (Signed)
Rn Anderson Malta called Rn [name] on [address] to verify that pt is stable for radiation treatment today. Rn stated that pt was stable and good to go for treatment. L2 notified by Juliette Mangle.

## 2022-11-15 NOTE — Progress Notes (Signed)
PROGRESS NOTE    Sheila Jacobs  [id number] DOB: 18-Feb-1951 DOA: 11/05/2022 PCP: Arthur Holms, NP   Brief Narrative:  72 year old with history of hypertension, hyperlipidemia, GERD, obstructive sleep apnea, prior PE, metastatic breast cancer presented to the hospital with chest pain, nausea and vomiting. Workup in the hospital included MRI of the brain that showed new onset multiple widespread metastatic disease. Oncology, radiation oncology as well as palliative care were involved. Patient with widely metastatic cancer, with multiple brain mets transferred to [facility] long hospital to start inpatient palliative radiation to the brain.  She subsequently also developed delirium with intermittent confusion.  Assessment & Plan: Stage IV breast cancer, multiple metastasis to lungs, liver, bone, peritoneum and extensive brain mets  Goals of care -Seen by oncology and radiation oncology. -she was transferred to [facility] long hospital for whole brain radiation.  Currently undergoing whole brain radiation (total of 10 treatments) -Prognosis is very poor.  Palliative care following: Patient/family want to continue radiation treatment for now and if no improvement, they are contemplating home hospice.  Patient currently on oral steroids.  Acute metabolic encephalopathy/delirium -Multifactorial secondary to brain mets/steroids/poor clinical status -Improving.  Still intermittently confused but more awake and responsive. -Monitor mental status.  Fall precautions.  Delirium precautions.  Chronic pulmonary embolism -Continue Eliquis.  Essential hypertension -Blood pressure stable on metoprolol succinate  Hypothyroidism -Continue Synthroid  Morbid obesity -Outpatient follow-up  DVT prophylaxis: Eliquis Code Status: DNR Family Communication: Grandson at bedside/daughter on video chat on phone Disposition Plan: Status is: Inpatient Remains inpatient appropriate because: Of severity of illness.   Need for radiation treatment  Consultants: Oncology/radiation oncology/palliative care  Procedures: None  Antimicrobials: None   Subjective: Patient seen and examined at bedside.  No seizures, vomiting or fever reported.  Objective: Vitals:   11/14/22 1413 11/14/22 1527 11/14/22 2014 11/15/22 0446  BP: (!) 142/68  (!) 146/71 137/72  Pulse: 84 80 71 75  Resp: '18  14 14  '$ Temp: 98.2 F (36.8 C)  98.1 F (36.7 C) 97.9 F (36.6 C)  TempSrc: Oral  Oral Oral  SpO2: 95% 92% 95% 95%  Weight:      Height:        Intake/Output Summary (Last 24 hours) at 11/15/2022 0821 Last data filed at 11/15/2022 0504 Gross per 24 hour  Intake 720 ml  Output 2250 ml  Net -1530 ml    Filed Weights   11/05/22 1100 11/10/22 0413  Weight: 100.7 kg 108.7 kg    Examination:  General exam: No distress.  Still on room air.  Still extremely slow to respond.  Looks chronically ill and deconditioned.  Poor historian.  Extremely hard of hearing. Respiratory system: Decreased breath sounds at bases bilaterally with some crackles  cardiovascular system: Rate controlled; S1 and S2 heard  gastrointestinal system: Abdomen is morbidly obese, distended slightly; soft and nontender.  Bowel sounds normally heard  extremities: Mild lower extremity edema present; no clubbing  Data Reviewed: I have personally reviewed following labs and imaging studies  CBC: Recent Labs  Lab 11/10/22 0911  WBC 4.0  HGB 11.4*  HCT 30.9*  MCV 98.1  PLT 123XX123    Basic Metabolic Panel: Recent Labs  Lab 11/10/22 0911  NA 138  K 4.5  CL 112*  CO2 19*  GLUCOSE 115*  BUN 10  CREATININE 0.80  CALCIUM 8.5*  MG 2.2    GFR: Estimated Creatinine Clearance: 74.1 mL/min (by C-G formula based on SCr of 0.8 mg/dL). Liver Function  Tests: No results for input(s): "AST", "ALT", "ALKPHOS", "BILITOT", "PROT", "ALBUMIN" in the last 168 hours.  No results for input(s): "LIPASE", "AMYLASE" in the last 168 hours. No results for  input(s): "AMMONIA" in the last 168 hours. Coagulation Profile: No results for input(s): "INR", "PROTIME" in the last 168 hours. Cardiac Enzymes: No results for input(s): "CKTOTAL", "CKMB", "CKMBINDEX", "TROPONINI" in the last 168 hours. BNP (last 3 results) No results for input(s): "PROBNP" in the last 8760 hours. HbA1C: No results for input(s): "HGBA1C" in the last 72 hours. CBG: No results for input(s): "GLUCAP" in the last 168 hours. Lipid Profile: No results for input(s): "CHOL", "HDL", "LDLCALC", "TRIG", "CHOLHDL", "LDLDIRECT" in the last 72 hours. Thyroid Function Tests: No results for input(s): "TSH", "T4TOTAL", "FREET4", "T3FREE", "THYROIDAB" in the last 72 hours. Anemia Panel: No results for input(s): "VITAMINB12", "FOLATE", "FERRITIN", "TIBC", "IRON", "RETICCTPCT" in the last 72 hours. Sepsis Labs: No results for input(s): "PROCALCITON", "LATICACIDVEN" in the last 168 hours.  Recent Results (from the past 240 hour(s))  Resp panel by RT-PCR (RSV, Flu A&B, Covid) Anterior Nasal Swab     Status: None   Collection Time: 11/05/22 12:08 PM   Specimen: Anterior Nasal Swab  Result Value Ref Range Status   SARS Coronavirus 2 by RT PCR NEGATIVE NEGATIVE Final   Influenza A by PCR NEGATIVE NEGATIVE Final   Influenza B by PCR NEGATIVE NEGATIVE Final    Comment: (NOTE) The Xpert Xpress SARS-CoV-2/FLU/RSV plus assay is intended as an aid in the diagnosis of influenza from Nasopharyngeal swab specimens and should not be used as a sole basis for treatment. Nasal washings and aspirates are unacceptable for Xpert Xpress SARS-CoV-2/FLU/RSV testing.  Fact Sheet for Patients: EntrepreneurPulse.com.au  Fact Sheet for Healthcare Providers: IncredibleEmployment.be  This test is not yet approved or cleared by the [place] FDA and has been authorized for detection and/or diagnosis of SARS-CoV-2 by FDA under an Emergency Use Authorization (EUA). This  EUA will remain in effect (meaning this test can be used) for the duration of the COVID-19 declaration under Section 564(b)(1) of the Act, 21 U.S.C. section 360bbb-3(b)(1), unless the authorization is terminated or revoked.     Resp Syncytial Virus by PCR NEGATIVE NEGATIVE Final    Comment: (NOTE) Fact Sheet for Patients: EntrepreneurPulse.com.au  Fact Sheet for Healthcare Providers: IncredibleEmployment.be  This test is not yet approved or cleared by the [place] FDA and has been authorized for detection and/or diagnosis of SARS-CoV-2 by FDA under an Emergency Use Authorization (EUA). This EUA will remain in effect (meaning this test can be used) for the duration of the COVID-19 declaration under Section 564(b)(1) of the Act, 21 U.S.C. section 360bbb-3(b)(1), unless the authorization is terminated or revoked.  Performed at [facility], [place] [address] [place], [place] [address]          Radiology Studies: No results found.      Scheduled Meds:  apixaban  5 mg Oral BID   dexamethasone  4 mg Oral Daily   feeding supplement  237 mL Oral BID BM   levothyroxine  100 mcg Oral QAC breakfast   metoprolol succinate  50 mg Oral Daily   pantoprazole  40 mg Oral Daily   polyethylene glycol  17 g Oral BID   Continuous Infusions:        Aline August, MD [facility] 11/15/2022, 8:21 AM

## 2022-11-15 NOTE — Care Management Important Message (Signed)
Important Message  Patient Details IM Letter given. Name: Sheila Jacobs MRN: [id number] Date of Birth: 1951/08/10   Medicare Important Message Given:  Yes     Kerin Salen 11/15/2022, 9:52 AM

## 2022-11-16 ENCOUNTER — Ambulatory Visit
Admit: 2022-11-16 | Discharge: 2022-11-16 | Disposition: A | Discharge status: Home / Self Care | Payer: 59 | Attending: Radiation Oncology | Admitting: Radiation Oncology

## 2022-11-16 ENCOUNTER — Other Ambulatory Visit: Payer: Self-pay

## 2022-11-16 DIAGNOSIS — R112 Nausea with vomiting, unspecified: Secondary | ICD-10-CM | POA: Diagnosis not present

## 2022-11-16 DIAGNOSIS — Z515 Encounter for palliative care: Secondary | ICD-10-CM | POA: Diagnosis not present

## 2022-11-16 LAB — RAD ONC ARIA SESSION SUMMARY
Course Elapsed Days: 3
Plan Fractions Treated to Date: 4
Plan Prescribed Dose Per Fraction: 3 Gy
Plan Total Fractions Prescribed: 10
Plan Total Prescribed Dose: 30 Gy
Reference Point Dosage Given to Date: 12 Gy
Reference Point Session Dosage Given: 3 Gy
Session Number: 4

## 2022-11-16 NOTE — Progress Notes (Signed)
PROGRESS NOTE    Sheila Jacobs  [id number] DOB: Oct 25, 1950 DOA: 11/05/2022 PCP: Arthur Holms, NP   Brief Narrative:  72 year old with history of hypertension, hyperlipidemia, GERD, obstructive sleep apnea, prior PE, metastatic breast cancer presented to the hospital with chest pain, nausea and vomiting. Workup in the hospital included MRI of the brain that showed new onset multiple widespread metastatic disease. Oncology, radiation oncology as well as palliative care were involved. Patient with widely metastatic cancer, with multiple brain mets transferred to [facility] long hospital to start inpatient palliative radiation to the brain.  She subsequently also developed delirium with intermittent confusion.  Assessment & Plan: Stage IV breast cancer, multiple metastasis to lungs, liver, bone, peritoneum and extensive brain mets  Goals of care -Seen by oncology and radiation oncology. -she was transferred to [facility] long hospital for whole brain radiation.  Currently undergoing whole brain radiation (total of 10 treatments) -Prognosis is very poor.  Palliative care following: Patient/family want to continue radiation treatment for now and if no improvement, they are contemplating home hospice.  Patient currently on oral steroids.  Acute metabolic encephalopathy/delirium -Multifactorial secondary to brain mets/steroids/poor clinical status -Improving.  Still intermittently confused but more awake and responsive. -Monitor mental status.  Fall precautions.  Delirium precautions.  Chronic pulmonary embolism -Continue Eliquis.  Essential hypertension -Blood pressure stable on metoprolol succinate  Hypothyroidism -Continue Synthroid  Morbid obesity -Outpatient follow-up  DVT prophylaxis: Eliquis Code Status: DNR Family Communication: Grandson at bedside/daughter on video chat on phone Disposition Plan: Status is: Inpatient Remains inpatient appropriate because: Of severity of illness.   Need for radiation treatment  Consultants: Oncology/radiation oncology/palliative care  Procedures: None  Antimicrobials: None   Subjective: Patient seen and examined at bedside.  No agitation, fever, vomiting reported.  Very hard of hearing.   Objective: Vitals:   11/14/22 2014 11/15/22 0446 11/15/22 1334 11/16/22 0553  BP: (!) 146/71 137/72 105/82 124/67  Pulse: 71 75 84 71  Resp: '14 14 16 18  '$ Temp: 98.1 F (36.7 C) 97.9 F (36.6 C) 98.2 F (36.8 C) 98 F (36.7 C)  TempSrc: Oral Oral Oral Oral  SpO2: 95% 95% 96% 93%  Weight:      Height:        Intake/Output Summary (Last 24 hours) at 11/16/2022 0821 Last data filed at 11/16/2022 0328 Gross per 24 hour  Intake 100 ml  Output 3300 ml  Net -3200 ml    Filed Weights   11/05/22 1100 11/10/22 0413  Weight: 100.7 kg 108.7 kg    Examination:  General exam: On room air.  No acute distress currently.  Still extremely slow to respond.  Looks chronically ill and deconditioned.  Poor historian.  Extremely hard of hearing. Respiratory system: Bilateral decreased breath sounds at bases with scattered crackles  cardiovascular system: S1-S2 heard; rate mostly controlled gastrointestinal system: Abdomen is morbidly obese, mildly distended; soft and nontender.  Normal bowel sounds heard extremities: No cyanosis; trace lower extremity edema present  Data Reviewed: I have personally reviewed following labs and imaging studies  CBC: Recent Labs  Lab 11/10/22 0911  WBC 4.0  HGB 11.4*  HCT 30.9*  MCV 98.1  PLT 123XX123    Basic Metabolic Panel: Recent Labs  Lab 11/10/22 0911  NA 138  K 4.5  CL 112*  CO2 19*  GLUCOSE 115*  BUN 10  CREATININE 0.80  CALCIUM 8.5*  MG 2.2    GFR: Estimated Creatinine Clearance: 74.1 mL/min (by C-G formula based on SCr  of 0.8 mg/dL). Liver Function Tests: No results for input(s): "AST", "ALT", "ALKPHOS", "BILITOT", "PROT", "ALBUMIN" in the last 168 hours.  No results for input(s):  "LIPASE", "AMYLASE" in the last 168 hours. No results for input(s): "AMMONIA" in the last 168 hours. Coagulation Profile: No results for input(s): "INR", "PROTIME" in the last 168 hours. Cardiac Enzymes: No results for input(s): "CKTOTAL", "CKMB", "CKMBINDEX", "TROPONINI" in the last 168 hours. BNP (last 3 results) No results for input(s): "PROBNP" in the last 8760 hours. HbA1C: No results for input(s): "HGBA1C" in the last 72 hours. CBG: No results for input(s): "GLUCAP" in the last 168 hours. Lipid Profile: No results for input(s): "CHOL", "HDL", "LDLCALC", "TRIG", "CHOLHDL", "LDLDIRECT" in the last 72 hours. Thyroid Function Tests: No results for input(s): "TSH", "T4TOTAL", "FREET4", "T3FREE", "THYROIDAB" in the last 72 hours. Anemia Panel: No results for input(s): "VITAMINB12", "FOLATE", "FERRITIN", "TIBC", "IRON", "RETICCTPCT" in the last 72 hours. Sepsis Labs: No results for input(s): "PROCALCITON", "LATICACIDVEN" in the last 168 hours.  No results found for this or any previous visit (from the past 240 hour(s)).        Radiology Studies: No results found.      Scheduled Meds:  apixaban  5 mg Oral BID   dexamethasone  4 mg Oral Daily   feeding supplement  237 mL Oral BID BM   levothyroxine  100 mcg Oral QAC breakfast   metoprolol succinate  50 mg Oral Daily   pantoprazole  40 mg Oral Daily   polyethylene glycol  17 g Oral BID   Continuous Infusions:        Aline August, MD [facility] 11/16/2022, 8:21 AM

## 2022-11-16 NOTE — Progress Notes (Signed)
Physical Therapy Treatment Patient Details Name: Sheila Jacobs MRN: [id number] DOB: July 01, 1951 Today's Date: 11/16/2022   History of Present Illness 72 year old female sent to the ED for evaluation of chest pain, nausea and vomiting; admitted 2/24. PMHX: past medical history significant for hypertension, hyperlipidemia, GERD, goiter, OSA, prior PE, arthritis, metastatic breast cancer.    PT Comments    The patient  demonstrates improved  mobility, no assistance for sitting on bed edge. Patient ambulated x 50' with RW ,  min guard<followed recliner behind for safety. Continue PT for increased mobility.  Recommendations for follow up therapy are one component of a multi-disciplinary discharge planning process, led by the attending physician.  Recommendations may be updated based on patient status, additional functional criteria and insurance authorization.  Follow Up Recommendations  Home health PT     Assistance Recommended at Discharge Intermittent Supervision/Assistance  Patient can return home with the following A little help with walking and/or transfers;A little help with bathing/dressing/bathroom;Assistance with cooking/housework;Assist for transportation;Help with stairs or ramp for entrance   Equipment Recommendations  None recommended by PT    Recommendations for Other Services       Precautions / Restrictions Precautions Precautions: Fall Precaution Comments: Very HOH; writing notes to communicate clearly Restrictions Weight Bearing Restrictions: No     Mobility  Bed Mobility Overal bed mobility: Needs Assistance Bed Mobility: Supine to Sit     Supine to sit: Supervision, HOB elevated     General bed mobility comments: patient able to move to sitting without any assistance    Transfers Overall transfer level: Needs assistance Equipment used: Rolling walker (2 wheels) Transfers: Sit to/from Stand Sit to Stand: Min guard           General transfer  comment: patient stands with  supervision from bed.    Ambulation/Gait Ambulation/Gait assistance: Min guard, +2 safety/equipment Gait Distance (Feet): 50 Feet Assistive device: Rolling walker (2 wheels) Gait Pattern/deviations: Step-through pattern, Decreased stride length Gait velocity: decreased     General Gait Details: patient tolerated  distance very well.   Stairs             Wheelchair Mobility    Modified Rankin (Stroke Patients Only)       Balance Overall balance assessment: Needs assistance   Sitting balance-Leahy Scale: Good     Standing balance support: Bilateral upper extremity supported, Reliant on assistive device for balance Standing balance-Leahy Scale: Fair                              Cognition Arousal/Alertness: Awake/alert Behavior During Therapy: WFL for tasks assessed/performed                                   General Comments: follows written commands        Exercises      General Comments        Pertinent Vitals/Pain Pain Assessment Pain Assessment: No/denies pain    Home Living                          Prior Function            PT Goals (current goals can now be found in the care plan section) Progress towards PT goals: Progressing toward goals    Frequency    Min 3X/week  PT Plan Current plan remains appropriate    Co-evaluation              AM-PAC PT "6 Clicks" Mobility   Outcome Measure  Help needed turning from your back to your side while in a flat bed without using bedrails?: None Help needed moving from lying on your back to sitting on the side of a flat bed without using bedrails?: A Little Help needed moving to and from a bed to a chair (including a wheelchair)?: A Little Help needed standing up from a chair using your arms (e.g., wheelchair or bedside chair)?: A Little Help needed to walk in hospital room?: A Little Help needed climbing 3-5  steps with a railing? : A Lot 6 Click Score: 18    End of Session Equipment Utilized During Treatment: Gait belt Activity Tolerance: Patient tolerated treatment well Patient left: in chair;with call bell/phone within reach;with chair alarm set Nurse Communication: Mobility status PT Visit Diagnosis: Other abnormalities of gait and mobility (R26.89);Muscle weakness (generalized) (M62.81)     Time: [id number] PT Time Calculation (min) (ACUTE ONLY): 27 min  Charges:  $Gait Training: 23-37 mins                     [place] Office [phone] Weekend [id number]    Claretha Cooper 11/16/2022, 4:27 PM

## 2022-11-16 NOTE — Progress Notes (Signed)
  Daily Progress Note   Patient Name: Sheila Jacobs       Date: 11/16/2022 DOB: 1951/09/02  Age: 72 y.o. MRN#: [id number] Attending Physician: Aline August, MD Primary Care Physician: Arthur Holms, NP Admit Date: 11-18-2022 Length of Stay: 10 days  Reason for Consultation/Follow-up: Establishing goals of care  Subjective:   CC: Following up regarding goals for medical care.   Subjective:  Patient is undergoing radiation daily, in no distress, awake alert today.    Patient is to continue whole brain radiation with hope that it will allow patient to have more time.    Palliative medicine team will continue to follow along and should patient decide no longer to proceed with whole brain radiation, that home hospice support could be pursued.  Questions answered at this time.  Provided emotional support via active listening.    Objective:   Vital Signs:  BP 115/65 (BP Location: Right Arm)   Pulse 76   Temp 97.9 F (36.6 C) (Oral)   Resp 20   Ht 5\' 3"  (1.6 m)   Wt 108.7 kg   SpO2 93%   BMI 42.45 kg/m   Physical Exam: General: NAD, awake today Eyes: no drainage noted HENT: moist mucous membranes Cardiovascular: RRR Respiratory: no increased work of breathing noted, not in respiratory distress Abdomen: not distended Skin: no rashes or lesions on visible skin Neuro: awake, HOH  Imaging:  I personally reviewed recent imaging.   Assessment & Plan:   Assessment: 72 y.o. female  with past medical history of HTN, HLD, GERD, goiter, OSA, prior PE, arthritis, and metastatic breast cancer admitted on 11/18/22 with chest pain, nausea and vomiting.    Recommendations/Plan: # Complex medical decision making/goals of care:  - Patient and daughter elected to proceed with whole brain radiation.  Should patient have adverse effects from radiation, daughter noted we will discontinue at that time.  Patient plan for 10 rounds of whole brain radiation.  Will continue with DNR/DNI status.   Patient will continue to receive palliative support while receiving radiation.  Can transition to hospice care once radiation completed if daughter determines this is best support for them as a family.  -  Code Status: DNR  # Symptom management:  -As per primary team  # Discharge Planning: Completing WBRT. Palliative supporting. Potential hospice once WBRT completed.   Discussed with: patient, family  Thank you for allowing the palliative care team to participate in the care Aeryn F Hornsby.  Dibble MD Palliative Care Provider PMT # [phone]  This provider did a review of EMR, discussing care with other staff members involved in patient's medical care, obtaining relevant history and information from patient and/or patient's family, and personal review of imaging and lab work. Greater than 50% of the time was spent counseling and coordinating care related to the above assessment and plan.

## 2022-11-17 DIAGNOSIS — R112 Nausea with vomiting, unspecified: Secondary | ICD-10-CM | POA: Diagnosis not present

## 2022-11-17 MED ORDER — MORPHINE SULFATE (PF) 2 MG/ML IV SOLN
0.5000 mg | Freq: Once | INTRAVENOUS | Status: AC
  Administered 2022-11-17: 0.5 mg via INTRAVENOUS
  Filled 2022-11-17: qty 1

## 2022-11-17 MED ORDER — GUAIFENESIN-DM 100-10 MG/5ML PO SYRP
5.0000 mL | ORAL_SOLUTION | ORAL | Status: DC | PRN
  Administered 2022-11-17 – 2022-11-21 (×10): 5 mL via ORAL
  Filled 2022-11-17 (×12): qty 5

## 2022-11-17 MED ORDER — ALUM & MAG HYDROXIDE-SIMETH 200-200-20 MG/5ML PO SUSP
30.0000 mL | Freq: Four times a day (QID) | ORAL | Status: DC | PRN
  Administered 2022-11-17: 30 mL via ORAL
  Filled 2022-11-17: qty 30

## 2022-11-17 MED ORDER — PROCHLORPERAZINE EDISYLATE 10 MG/2ML IJ SOLN
10.0000 mg | Freq: Once | INTRAMUSCULAR | Status: AC
  Administered 2022-11-17: 10 mg via INTRAVENOUS
  Filled 2022-11-17: qty 2

## 2022-11-17 NOTE — Progress Notes (Signed)
PROGRESS NOTE    Sheila Jacobs  [id number] DOB: 1950/10/15 DOA: 11/05/2022 PCP: Arthur Holms, NP   Brief Narrative:  72 year old with history of hypertension, hyperlipidemia, GERD, obstructive sleep apnea, prior PE, metastatic breast cancer presented to the hospital with chest pain, nausea and vomiting. Workup in the hospital included MRI of the brain that showed new onset multiple widespread metastatic disease. Oncology, radiation oncology as well as palliative care were involved. Patient with widely metastatic cancer, with multiple brain mets transferred to [facility] long hospital to start inpatient palliative radiation to the brain.  She subsequently also developed delirium with intermittent confusion.  Assessment & Plan: Stage IV breast cancer, multiple metastasis to lungs, liver, bone, peritoneum and extensive brain mets  Goals of care -Seen by oncology and radiation oncology. -she was transferred to [facility] long hospital for whole brain radiation.  Currently undergoing whole brain radiation (total of 10 treatments) -Prognosis is very poor.  Palliative care following: Patient/family want to continue radiation treatment for now and if no improvement, they are contemplating home hospice.  Patient currently on oral steroids.  Acute metabolic encephalopathy/delirium -Multifactorial secondary to brain mets/steroids/poor clinical status -Improving.  Still intermittently confused but more awake and responsive. -Monitor mental status.  Fall precautions.  Delirium precautions.  Chronic pulmonary embolism -Continue Eliquis.  Essential hypertension -Blood pressure stable on metoprolol succinate  Hypothyroidism -Continue Synthroid  Morbid obesity -Outpatient follow-up  DVT prophylaxis: Eliquis Code Status: DNR Family Communication: daughter at bedside Disposition Plan: Status is: Inpatient Remains inpatient appropriate because: Of severity of illness.  Need for radiation  treatment  Consultants: Oncology/radiation oncology/palliative care  Procedures: None  Antimicrobials: None   Subjective: Patient seen and examined at bedside.  No seizures  or vomiting reported. Daughter at bedside; apparently patient had chest pain earlier. Objective: Vitals:   11/16/22 0553 11/16/22 1343 11/16/22 2134 11/17/22 0552  BP: 124/67 115/65 (!) 147/73 139/71  Pulse: 71 76 75 85  Resp: '18 20 18 18  '$ Temp: 98 F (36.7 C) 97.9 F (36.6 C) 97.8 F (36.6 C) 97.9 F (36.6 C)  TempSrc: Oral Oral Oral Oral  SpO2: 93% 93% 94% 94%  Weight:      Height:        Intake/Output Summary (Last 24 hours) at 11/17/2022 1001 Last data filed at 11/17/2022 0919 Gross per 24 hour  Intake 240 ml  Output 900 ml  Net -660 ml    Filed Weights   11/05/22 1100 11/10/22 0413  Weight: 100.7 kg 108.7 kg    Examination:  General exam: No distress. Still on room air. Sleepy, wakes up slightly; still extremely slow to respond.  Looks chronically ill and deconditioned.  Poor historian.  Extremely hard of hearing. Respiratory system: Decreased breath sounds at bases bilaterally; some crackles cardiovascular system: Rate controlled; S1-S2 heard gastrointestinal system: Abdomen is morbidly obese, distended slightly; soft and nontender.  Bowel sounds heard extremities: Mild lower extremity edema present; no clubbing  Data Reviewed: I have personally reviewed following labs and imaging studies  CBC: No results for input(s): "WBC", "NEUTROABS", "HGB", "HCT", "MCV", "PLT" in the last 168 hours.  Basic Metabolic Panel: No results for input(s): "NA", "K", "CL", "CO2", "GLUCOSE", "BUN", "CREATININE", "CALCIUM", "MG", "PHOS" in the last 168 hours.  GFR: Estimated Creatinine Clearance: 72.9 mL/min (by C-G formula based on SCr of 0.8 mg/dL). Liver Function Tests: No results for input(s): "AST", "ALT", "ALKPHOS", "BILITOT", "PROT", "ALBUMIN" in the last 168 hours.  No results for input(s):  "LIPASE", "AMYLASE"  in the last 168 hours. No results for input(s): "AMMONIA" in the last 168 hours. Coagulation Profile: No results for input(s): "INR", "PROTIME" in the last 168 hours. Cardiac Enzymes: No results for input(s): "CKTOTAL", "CKMB", "CKMBINDEX", "TROPONINI" in the last 168 hours. BNP (last 3 results) No results for input(s): "PROBNP" in the last 8760 hours. HbA1C: No results for input(s): "HGBA1C" in the last 72 hours. CBG: No results for input(s): "GLUCAP" in the last 168 hours. Lipid Profile: No results for input(s): "CHOL", "HDL", "LDLCALC", "TRIG", "CHOLHDL", "LDLDIRECT" in the last 72 hours. Thyroid Function Tests: No results for input(s): "TSH", "T4TOTAL", "FREET4", "T3FREE", "THYROIDAB" in the last 72 hours. Anemia Panel: No results for input(s): "VITAMINB12", "FOLATE", "FERRITIN", "TIBC", "IRON", "RETICCTPCT" in the last 72 hours. Sepsis Labs: No results for input(s): "PROCALCITON", "LATICACIDVEN" in the last 168 hours.  No results found for this or any previous visit (from the past 240 hour(s)).        Radiology Studies: No results found.      Scheduled Meds:  apixaban  5 mg Oral BID   dexamethasone  4 mg Oral Daily   feeding supplement  237 mL Oral BID BM   levothyroxine  100 mcg Oral QAC breakfast   metoprolol succinate  50 mg Oral Daily   pantoprazole  40 mg Oral Daily   polyethylene glycol  17 g Oral BID   Continuous Infusions:        Aline August, MD [facility] 11/17/2022, 10:01 AM

## 2022-11-18 DIAGNOSIS — R112 Nausea with vomiting, unspecified: Secondary | ICD-10-CM | POA: Diagnosis not present

## 2022-11-18 NOTE — Progress Notes (Signed)
PROGRESS NOTE    Sheila Jacobs  [id number] DOB: Dec 14, 1950 DOA: 11/05/2022 PCP: Arthur Holms, NP   Brief Narrative:  72 year old with history of hypertension, hyperlipidemia, GERD, obstructive sleep apnea, prior PE, metastatic breast cancer presented to the hospital with chest pain, nausea and vomiting. Workup in the hospital included MRI of the brain that showed new onset multiple widespread metastatic disease. Oncology, radiation oncology as well as palliative care were involved. Patient with widely metastatic cancer, with multiple brain mets transferred to [facility] long hospital to start inpatient palliative radiation to the brain.  She subsequently also developed delirium with intermittent confusion.  Assessment & Plan: Stage IV breast cancer, multiple metastasis to lungs, liver, bone, peritoneum and extensive brain mets  Goals of care -Seen by oncology and radiation oncology. -she was transferred to [facility] long hospital for whole brain radiation.  Currently undergoing whole brain radiation (total of 10 treatments) -Prognosis is very poor.  Palliative care following: Patient/family want to continue radiation treatment for now and if no improvement, they are contemplating home hospice.  Patient currently on oral steroids.  Acute metabolic encephalopathy/delirium -Multifactorial secondary to brain mets/steroids/poor clinical status -Improving.  Still intermittently confused  -Monitor mental status.  Fall precautions.  Delirium precautions.  Chronic pulmonary embolism -Continue Eliquis.  Essential hypertension -Blood pressure stable on metoprolol succinate  Hypothyroidism -Continue Synthroid  Morbid obesity -Outpatient follow-up  DVT prophylaxis: Eliquis Code Status: DNR Family Communication: daughter at bedside Disposition Plan: Status is: Inpatient Remains inpatient appropriate because: Of severity of illness.  Need for radiation treatment  Consultants:  Oncology/radiation oncology/palliative care  Procedures: None  Antimicrobials: None   Subjective: Patient seen and examined at bedside.  No agitation, seizures, vomiting or fever reported.   Objective: Vitals:   11/17/22 1314 11/17/22 1352 11/17/22 2204 11/18/22 0526  BP: 127/73  117/63 135/68  Pulse: (!) 101  78 77  Resp: '18  16 19  '$ Temp: 98.3 F (36.8 C)  98.5 F (36.9 C) 98.3 F (36.8 C)  TempSrc: Oral  Oral Oral  SpO2: 93% 90% 92% 91%  Weight:      Height:        Intake/Output Summary (Last 24 hours) at 11/18/2022 0823 Last data filed at 11/18/2022 0641 Gross per 24 hour  Intake 360 ml  Output 1450 ml  Net -1090 ml    Filed Weights   11/05/22 1100 11/10/22 0413  Weight: 100.7 kg 108.7 kg    Examination:  General exam: On room air.  No acute distress.  Still slow to respond.  Looks chronically ill and deconditioned.  Poor historian.  Extremely hard of hearing. Respiratory system: Bilateral decreased breath sounds at bases with some crackles cardiovascular system: S1 and S2 are heard; rate controlled currently gastrointestinal system: Abdomen is morbidly obese, still distended; soft and nontender.  Normal bowel sounds heard  extremities: No cyanosis; mild lower extremity edema present  Data Reviewed: I have personally reviewed following labs and imaging studies  CBC: No results for input(s): "WBC", "NEUTROABS", "HGB", "HCT", "MCV", "PLT" in the last 168 hours.  Basic Metabolic Panel: No results for input(s): "NA", "K", "CL", "CO2", "GLUCOSE", "BUN", "CREATININE", "CALCIUM", "MG", "PHOS" in the last 168 hours.  GFR: Estimated Creatinine Clearance: 72.9 mL/min (by C-G formula based on SCr of 0.8 mg/dL). Liver Function Tests: No results for input(s): "AST", "ALT", "ALKPHOS", "BILITOT", "PROT", "ALBUMIN" in the last 168 hours.  No results for input(s): "LIPASE", "AMYLASE" in the last 168 hours. No results for input(s): "AMMONIA"  in the last 168  hours. Coagulation Profile: No results for input(s): "INR", "PROTIME" in the last 168 hours. Cardiac Enzymes: No results for input(s): "CKTOTAL", "CKMB", "CKMBINDEX", "TROPONINI" in the last 168 hours. BNP (last 3 results) No results for input(s): "PROBNP" in the last 8760 hours. HbA1C: No results for input(s): "HGBA1C" in the last 72 hours. CBG: No results for input(s): "GLUCAP" in the last 168 hours. Lipid Profile: No results for input(s): "CHOL", "HDL", "LDLCALC", "TRIG", "CHOLHDL", "LDLDIRECT" in the last 72 hours. Thyroid Function Tests: No results for input(s): "TSH", "T4TOTAL", "FREET4", "T3FREE", "THYROIDAB" in the last 72 hours. Anemia Panel: No results for input(s): "VITAMINB12", "FOLATE", "FERRITIN", "TIBC", "IRON", "RETICCTPCT" in the last 72 hours. Sepsis Labs: No results for input(s): "PROCALCITON", "LATICACIDVEN" in the last 168 hours.  No results found for this or any previous visit (from the past 240 hour(s)).        Radiology Studies: No results found.      Scheduled Meds:  apixaban  5 mg Oral BID   dexamethasone  4 mg Oral Daily   feeding supplement  237 mL Oral BID BM   levothyroxine  100 mcg Oral QAC breakfast   metoprolol succinate  50 mg Oral Daily   pantoprazole  40 mg Oral Daily   polyethylene glycol  17 g Oral BID   Continuous Infusions:        Aline August, MD [facility] 11/18/2022, 8:23 AM

## 2022-11-18 NOTE — Progress Notes (Signed)
OT Cancellation Note  Patient Details Name: Sheila Jacobs MRN: [id number] DOB: [birth year]   Cancelled Treatment:    Reason Eval/Treat Not Completed: Other (comment). Attempted twice to see patient today - both times she was sleeping and family declined on her behalf. Agreeable to attempt tomorrow.  Lenward Chancellor 11/18/2022, 2:38 PM

## 2022-11-19 ENCOUNTER — Other Ambulatory Visit: Payer: Self-pay

## 2022-11-19 ENCOUNTER — Ambulatory Visit
Admit: 2022-11-19 | Discharge: 2022-11-19 | Disposition: A | Discharge status: Home / Self Care | Payer: 59 | Attending: Radiation Oncology | Admitting: Radiation Oncology

## 2022-11-19 DIAGNOSIS — Z7189 Other specified counseling: Secondary | ICD-10-CM | POA: Diagnosis not present

## 2022-11-19 DIAGNOSIS — Z515 Encounter for palliative care: Secondary | ICD-10-CM | POA: Diagnosis not present

## 2022-11-19 DIAGNOSIS — R531 Weakness: Secondary | ICD-10-CM | POA: Diagnosis not present

## 2022-11-19 DIAGNOSIS — R112 Nausea with vomiting, unspecified: Secondary | ICD-10-CM | POA: Diagnosis not present

## 2022-11-19 LAB — RAD ONC ARIA SESSION SUMMARY
Course Elapsed Days: 6
Plan Fractions Treated to Date: 5
Plan Prescribed Dose Per Fraction: 3 Gy
Plan Total Fractions Prescribed: 10
Plan Total Prescribed Dose: 30 Gy
Reference Point Dosage Given to Date: 15 Gy
Reference Point Session Dosage Given: 3 Gy
Session Number: 5

## 2022-11-19 NOTE — Progress Notes (Signed)
PT Cancellation Note  Patient Details Name: Sheila Jacobs MRN: [id number] DOB: [birth year]   Cancelled Treatment:    Reason Eval/Treat Not Completed: Patient declined, no reason specified. Pt declines, reports she wants to walk tomorrow morning. Will follow.    Talbot Grumbling PT, DPT 11/19/22, 2:33 PM

## 2022-11-19 NOTE — Progress Notes (Signed)
PROGRESS NOTE    Sheila Jacobs  [id number] DOB: [birth year] DOA: 11/05/2022 PCP: Arthur Holms, NP   Brief Narrative:  72 year old with history of hypertension, hyperlipidemia, GERD, obstructive sleep apnea, prior PE, metastatic breast cancer presented to the hospital with chest pain, nausea and vomiting. Workup in the hospital included MRI of the brain that showed new onset multiple widespread metastatic disease. Oncology, radiation oncology as well as palliative care were involved. Patient with widely metastatic cancer, with multiple brain mets transferred to [facility] long hospital to start inpatient palliative radiation to the brain.  She subsequently also developed delirium with intermittent confusion.  Assessment & Plan: Stage IV breast cancer, multiple metastasis to lungs, liver, bone, peritoneum and extensive brain mets  Goals of care -Seen by oncology and radiation oncology. -she was transferred to [facility] long hospital for whole brain radiation.  Currently undergoing whole brain radiation (total of 10 treatments) -Prognosis is very poor.  Palliative care following: Patient/family want to continue radiation treatment for now and if no improvement, they are contemplating home hospice.  Patient currently on oral steroids.  Acute metabolic encephalopathy/delirium -Multifactorial secondary to brain mets/steroids/poor clinical status -Improving.  Still intermittently confused  -Monitor mental status.  Fall precautions.  Delirium precautions.  Chronic pulmonary embolism -Continue Eliquis.  Essential hypertension -Blood pressure stable on metoprolol succinate  Hypothyroidism -Continue Synthroid  Morbid obesity -Outpatient follow-up  DVT prophylaxis: Eliquis Code Status: DNR Family Communication: Family member at bedside Disposition Plan: Status is: Inpatient Remains inpatient appropriate because: Of severity of illness.  Need for completion of radiation  treatment  Consultants: Oncology/radiation oncology/palliative care  Procedures: None  Antimicrobials: None   Subjective: Patient seen and examined at bedside.  No fever, seizures, agitation reported.  Objective: Vitals:   11/18/22 1341 11/18/22 2016 11/18/22 2125 11/19/22 0632  BP:  113/71  116/64  Pulse:  80  76  Resp:  20  16  Temp:  98.5 F (36.9 C)  98 F (36.7 C)  TempSrc:  Oral  Oral  SpO2: 95% 98% 98% 97%  Weight:      Height:        Intake/Output Summary (Last 24 hours) at 11/19/2022 0825 Last data filed at 11/19/2022 0644 Gross per 24 hour  Intake 120 ml  Output 2650 ml  Net -2530 ml    Filed Weights   11/05/22 1100 11/10/22 0413  Weight: 100.7 kg 108.7 kg    Examination:  General exam: Currently in no distress.  Still on room air.  Still slow to respond.  Looks chronically ill and deconditioned.  Poor historian.  Extremely hard of hearing. Respiratory system: Decreased breath sounds at bases bilaterally with scattered crackles  cardiovascular system: Currently rate controlled; S1 and S2 heard  gastrointestinal system: Abdomen is morbidly obese, distended slightly; soft and nontender.  Bowel sounds normally heard extremities: Trace lower extremity edema present; no clubbing  Data Reviewed: I have personally reviewed following labs and imaging studies  CBC: No results for input(s): "WBC", "NEUTROABS", "HGB", "HCT", "MCV", "PLT" in the last 168 hours.  Basic Metabolic Panel: No results for input(s): "NA", "K", "CL", "CO2", "GLUCOSE", "BUN", "CREATININE", "CALCIUM", "MG", "PHOS" in the last 168 hours.  GFR: Estimated Creatinine Clearance: 72.9 mL/min (by C-G formula based on SCr of 0.8 mg/dL). Liver Function Tests: No results for input(s): "AST", "ALT", "ALKPHOS", "BILITOT", "PROT", "ALBUMIN" in the last 168 hours.  No results for input(s): "LIPASE", "AMYLASE" in the last 168 hours. No results for input(s): "AMMONIA" in the  last 168  hours. Coagulation Profile: No results for input(s): "INR", "PROTIME" in the last 168 hours. Cardiac Enzymes: No results for input(s): "CKTOTAL", "CKMB", "CKMBINDEX", "TROPONINI" in the last 168 hours. BNP (last 3 results) No results for input(s): "PROBNP" in the last 8760 hours. HbA1C: No results for input(s): "HGBA1C" in the last 72 hours. CBG: No results for input(s): "GLUCAP" in the last 168 hours. Lipid Profile: No results for input(s): "CHOL", "HDL", "LDLCALC", "TRIG", "CHOLHDL", "LDLDIRECT" in the last 72 hours. Thyroid Function Tests: No results for input(s): "TSH", "T4TOTAL", "FREET4", "T3FREE", "THYROIDAB" in the last 72 hours. Anemia Panel: No results for input(s): "VITAMINB12", "FOLATE", "FERRITIN", "TIBC", "IRON", "RETICCTPCT" in the last 72 hours. Sepsis Labs: No results for input(s): "PROCALCITON", "LATICACIDVEN" in the last 168 hours.  No results found for this or any previous visit (from the past 240 hour(s)).        Radiology Studies: No results found.      Scheduled Meds:  apixaban  5 mg Oral BID   dexamethasone  4 mg Oral Daily   feeding supplement  237 mL Oral BID BM   levothyroxine  100 mcg Oral QAC breakfast   metoprolol succinate  50 mg Oral Daily   pantoprazole  40 mg Oral Daily   polyethylene glycol  17 g Oral BID   Continuous Infusions:        Aline August, MD [facility] 11/19/2022, 8:25 AM

## 2022-11-19 NOTE — Progress Notes (Signed)
  Daily Progress Note   Patient Name: Sheila Jacobs       Date: 11/19/2022 DOB: [birth year]  Age: 72 y.o. MRN#: [id number] Attending Physician: Aline August, MD Primary Care Physician: Sheila Holms, NP Admit Date: 11/27/22 Length of Stay: 13 days  Reason for Consultation/Follow-up: Establishing goals of care  Subjective:   CC: Following up regarding goals for medical care.   Subjective:  Patient is undergoing radiation daily, in no distress, awake alert today.    Patient is to continue whole brain radiation with hope that it will allow patient to have more time.    Palliative medicine team will continue to follow along and should patient decide no longer to proceed with whole brain radiation, that home hospice support could be pursued.  Questions answered at this time.  Provided emotional support via active listening. Patient wishes to continue with her WBRT, her last treatment is on 11-26-22.     Objective:   Vital Signs:  BP 116/64   Pulse 76   Temp 98 F (36.7 C) (Oral)   Resp 16   Ht 5\' 3"  (1.6 m)   Wt 108.7 kg   SpO2 95%   BMI 42.45 kg/m   Physical Exam: General: NAD, awake alert and able to verbalize today Eyes: no drainage noted HENT: moist mucous membranes Cardiovascular: RRR Respiratory: no increased work of breathing noted, not in respiratory distress Abdomen: not distended Skin: no rashes or lesions on visible skin Neuro: awake, HOH  Imaging:  I personally reviewed recent imaging.   Assessment & Plan:   Assessment: 72 y.o. female  with past medical history of HTN, HLD, GERD, goiter, OSA, prior PE, arthritis, and metastatic breast cancer admitted on 11/27/22 with chest pain, nausea and vomiting.    Recommendations/Plan: # Complex medical decision making/goals of care:  - Patient and daughter elected to proceed with whole brain radiation.  Should patient have adverse effects from radiation, daughter noted we will discontinue at that time.  Patient  plan for 10 rounds of whole brain radiation.  Will continue with DNR/DNI status.  Patient will continue to receive palliative support while receiving radiation.  Can transition to hospice care once radiation completed if daughter determines this is best support for them as a family. Checked in with rad-onc, last treatment date is 11-26-22.    -  Code Status: DNR  # Symptom management:  -As per primary team  # Discharge Planning: Completing WBRT. Palliative supporting. Potential hospice once WBRT completed. Last treatment date is 11-26-22.   Discussed with: patient, family  Thank you for allowing the palliative care team to participate in the care Sheila Jacobs.  [place] MD Palliative Care Provider PMT # [phone]  This provider did a review of EMR, discussing care with other staff members involved in patient's medical care, obtaining relevant history and information from patient and/or patient's family, and personal review of imaging and lab work. Greater than 50% of the time was spent counseling and coordinating care related to the above assessment and plan.

## 2022-11-19 NOTE — Progress Notes (Signed)
Occupational Therapy Treatment Patient Details Name: Sheila Jacobs MRN: [id number] DOB: 05-15-1951 Today's Date: 11/19/2022   History of present illness 72 year old female sent to the ED for evaluation of chest pain, nausea and vomiting; admitted 2/24. PMHX: past medical history significant for hypertension, hyperlipidemia, GERD, goiter, OSA, prior PE, arthritis, metastatic breast cancer.   OT comments  This 72 yo female seen today with plan to help her get up to recliner and work on some ADLs. Pt stated she did not want to get up until 6:00 pm, I made her aware I would not be here but could see her in an hour since she was currently eating and said per notebook paper she would wait until tomorrow. Did have a discussion with daughter about equipment that would be needed at home--tub bench added; 3n1 and RW had already been recommended as well as a hospital bed and wheelchair. Will continue to follow acutely.   Recommendations for follow up therapy are one component of a multi-disciplinary discharge planning process, led by the attending physician.  Recommendations may be updated based on patient status, additional functional criteria and insurance authorization.    Follow Up Recommendations  Home health OT     Assistance Recommended at Discharge Frequent or constant Supervision/Assistance  Patient can return home with the following  A little help with walking and/or transfers;A lot of help with bathing/dressing/bathroom;Assistance with cooking/housework;Assistance with feeding;Help with stairs or ramp for entrance;Assist for transportation;Direct supervision/assist for financial management;Direct supervision/assist for medications management   Equipment Recommendations  Hospital bed;Wheelchair (measurements OT);Wheelchair cushion (measurements OT);BSC/3in1;Tub/shower bench (RW)       Precautions / Restrictions Precautions Precautions: Fall Precaution Comments: Very HOH; writing notes to  communicate clearly Restrictions Weight Bearing Restrictions: No              ADL either performed or assessed with clinical judgement   ADL Overall ADL's : Needs assistance/impaired Eating/Feeding: Set up;Bed level                                     General ADL Comments: Spoke to patient's daughter in room about DME at home. They do not have anything per her report but feel pt and family who are caring for patient could benefit from tub bench, 3n1, and RW.    Extremity/Trunk Assessment Upper Extremity Assessment Upper Extremity Assessment: Generalized weakness            Vision Baseline Vision/History: 0 No visual deficits Ability to See in Adequate Light: 0 Adequate Patient Visual Report: No change from baseline            Cognition Arousal/Alertness: Awake/alert Behavior During Therapy: WFL for tasks assessed/performed Overall Cognitive Status: Difficult to assess                                                     Pertinent Vitals/ Pain       Pain Assessment Pain Assessment: No/denies pain         Frequency  Min 2X/week        Progress Toward Goals  OT Goals(current goals can now be found in the care plan section)  Progress towards OT goals: Not progressing toward goals - comment  Acute Rehab OT Goals Patient Stated  Goal: to get up around 6:00PM today OT Goal Formulation: With family Time For Goal Achievement: 11/21/22 Potential to Achieve Goals: [place] Discharge plan remains appropriate       AM-PAC OT "6 Clicks" Daily Activity     Outcome Measure   Help from another person eating meals?: None Help from another person taking care of personal grooming?: A Little Help from another person toileting, which includes using toliet, bedpan, or urinal?: A Lot Help from another person bathing (including washing, rinsing, drying)?: A Lot Help from another person to put on and taking off regular upper body  clothing?: A Lot Help from another person to put on and taking off regular lower body clothing?: A Lot 6 Click Score: 15    End of Session    OT Visit Diagnosis: Muscle weakness (generalized) (M62.81);Other abnormalities of gait and mobility (R26.89)      Patient Left in bed;with call bell/phone within reach;with bed alarm set;with family/visitor present (dtr and brother)           Time: [id number] OT Time Calculation (min): 12 min  Charges: OT General Charges $OT Visit: 1 Visit OT Treatments $Self Care/Home Management : 8-22 mins  [place] Office [phone]    Almon Register 11/19/2022, 1:24 PM

## 2022-11-20 ENCOUNTER — Ambulatory Visit
Admit: 2022-11-20 | Discharge: 2022-11-20 | Disposition: A | Discharge status: Home / Self Care | Payer: 59 | Attending: Radiation Oncology | Admitting: Radiation Oncology

## 2022-11-20 ENCOUNTER — Other Ambulatory Visit: Payer: Self-pay

## 2022-11-20 DIAGNOSIS — R627 Adult failure to thrive: Secondary | ICD-10-CM | POA: Diagnosis not present

## 2022-11-20 DIAGNOSIS — R112 Nausea with vomiting, unspecified: Secondary | ICD-10-CM | POA: Diagnosis not present

## 2022-11-20 LAB — RAD ONC ARIA SESSION SUMMARY
Course Elapsed Days: 7
Plan Fractions Treated to Date: 6
Plan Prescribed Dose Per Fraction: 3 Gy
Plan Total Fractions Prescribed: 10
Plan Total Prescribed Dose: 30 Gy
Reference Point Dosage Given to Date: 18 Gy
Reference Point Session Dosage Given: 3 Gy
Session Number: 6

## 2022-11-20 LAB — CBC
HCT: 30.4 % — ABNORMAL LOW (ref 36.0–46.0)
Hemoglobin: 10 g/dL — ABNORMAL LOW (ref 12.0–15.0)
MCH: 34.1 pg — ABNORMAL HIGH (ref 26.0–34.0)
MCHC: 32.9 g/dL (ref 30.0–36.0)
MCV: 103.8 fL — ABNORMAL HIGH (ref 80.0–100.0)
Platelets: 178 10*3/uL (ref 150–400)
RBC: 2.93 MIL/uL — ABNORMAL LOW (ref 3.87–5.11)
RDW: 18.7 % — ABNORMAL HIGH (ref 11.5–15.5)
WBC: 2.8 10*3/uL — ABNORMAL LOW (ref 4.0–10.5)
nRBC: 0.7 % — ABNORMAL HIGH (ref 0.0–0.2)

## 2022-11-20 MED ORDER — MORPHINE SULFATE (PF) 2 MG/ML IV SOLN
2.0000 mg | INTRAVENOUS | Status: DC | PRN
  Administered 2022-11-20 – 2022-11-24 (×8): 2 mg via INTRAVENOUS
  Filled 2022-11-20 (×8): qty 1

## 2022-11-20 MED ORDER — NITROGLYCERIN 0.4 MG SL SUBL: 0.4000 mg | SUBLINGUAL_TABLET | SUBLINGUAL | Status: DC | PRN

## 2022-11-20 NOTE — Progress Notes (Signed)
PROGRESS NOTE    Sheila Jacobs  [id number] DOB: [birth year] DOA: 11/05/2022 PCP: Arthur Holms, NP   Brief Narrative:  72 year old with history of hypertension, hyperlipidemia, GERD, obstructive sleep apnea, prior PE, metastatic breast cancer presented to the hospital with chest pain, nausea and vomiting. Workup in the hospital included MRI of the brain that showed new onset multiple widespread metastatic disease. Oncology, radiation oncology as well as palliative care were involved. Patient with widely metastatic cancer, with multiple brain mets transferred to [facility] long hospital to start inpatient palliative radiation to the brain.  She subsequently also developed delirium with intermittent confusion.  Assessment & Plan: Stage IV breast cancer, multiple metastasis to lungs, liver, bone, peritoneum and extensive brain mets  Goals of care -Seen by oncology and radiation oncology. -she was transferred to [facility] long hospital for whole brain radiation.  Currently undergoing whole brain radiation (total of 10 treatments, last day will be 11/26/2022) -Prognosis is very poor.  Palliative care following: Patient/family want to continue radiation treatment for now and if no improvement, they are contemplating home hospice.  Patient currently on oral steroids.  Acute metabolic encephalopathy/delirium -Multifactorial secondary to brain mets/steroids/poor clinical status -Improving.  Still intermittently confused  -Monitor mental status.  Fall precautions.  Delirium precautions.  Chronic pulmonary embolism -Continue Eliquis.  Essential hypertension -Blood pressure stable on metoprolol succinate  Hypothyroidism -Continue Synthroid  Morbid obesity -Outpatient follow-up  DVT prophylaxis: Eliquis Code Status: DNR Family Communication: Daughter at bedside Disposition Plan: Status is: Inpatient Remains inpatient appropriate because: Of severity of illness.  Need for completion of radiation  treatment  Consultants: Oncology/radiation oncology/palliative care  Procedures: None  Antimicrobials: None   Subjective: Patient seen and examined at bedside.  No seizures, agitation, fever or vomiting reported.  Objective: Vitals:   11/19/22 0840 11/19/22 1255 11/19/22 2211 11/20/22 0640  BP:  121/72 121/75 110/78  Pulse:  86 72 70  Resp:  '18 18 18  '$ Temp:  97.7 F (36.5 C) 98.1 F (36.7 C) 97.8 F (36.6 C)  TempSrc:  Axillary Oral Oral  SpO2: 95% 99% 100% 100%  Weight:      Height:        Intake/Output Summary (Last 24 hours) at 11/20/2022 0841 Last data filed at 11/20/2022 0700 Gross per 24 hour  Intake 240 ml  Output 3400 ml  Net -3160 ml    Filed Weights   11/05/22 1100 11/10/22 0413  Weight: 100.7 kg 108.7 kg    Examination:  General exam: On room air.  In no distress.  Still slow to respond.  Looks chronically ill and deconditioned.  Poor historian.  Extremely hard of hearing. Respiratory system: Bilateral decreased breath sounds at bases with some crackles cardiovascular system: S1 and S2 are heard; rate controlled mostly  gastrointestinal system: Abdomen is morbidly obese, still has some distention; soft and nontender.  Normal bowel sounds heard  extremities: No cyanosis; mild lower extremity edema present  Data Reviewed: I have personally reviewed following labs and imaging studies  CBC: Recent Labs  Lab 11/20/22 0547  WBC 2.8*  HGB 10.0*  HCT 30.4*  MCV 103.8*  PLT 0000000    Basic Metabolic Panel: No results for input(s): "NA", "K", "CL", "CO2", "GLUCOSE", "BUN", "CREATININE", "CALCIUM", "MG", "PHOS" in the last 168 hours.  GFR: Estimated Creatinine Clearance: 72.9 mL/min (by C-G formula based on SCr of 0.8 mg/dL). Liver Function Tests: No results for input(s): "AST", "ALT", "ALKPHOS", "BILITOT", "PROT", "ALBUMIN" in the last 168 hours.  No results for input(s): "LIPASE", "AMYLASE" in the last 168 hours. No results for input(s): "AMMONIA" in  the last 168 hours. Coagulation Profile: No results for input(s): "INR", "PROTIME" in the last 168 hours. Cardiac Enzymes: No results for input(s): "CKTOTAL", "CKMB", "CKMBINDEX", "TROPONINI" in the last 168 hours. BNP (last 3 results) No results for input(s): "PROBNP" in the last 8760 hours. HbA1C: No results for input(s): "HGBA1C" in the last 72 hours. CBG: No results for input(s): "GLUCAP" in the last 168 hours. Lipid Profile: No results for input(s): "CHOL", "HDL", "LDLCALC", "TRIG", "CHOLHDL", "LDLDIRECT" in the last 72 hours. Thyroid Function Tests: No results for input(s): "TSH", "T4TOTAL", "FREET4", "T3FREE", "THYROIDAB" in the last 72 hours. Anemia Panel: No results for input(s): "VITAMINB12", "FOLATE", "FERRITIN", "TIBC", "IRON", "RETICCTPCT" in the last 72 hours. Sepsis Labs: No results for input(s): "PROCALCITON", "LATICACIDVEN" in the last 168 hours.  No results found for this or any previous visit (from the past 240 hour(s)).        Radiology Studies: No results found.      Scheduled Meds:  apixaban  5 mg Oral BID   dexamethasone  4 mg Oral Daily   feeding supplement  237 mL Oral BID BM   levothyroxine  100 mcg Oral QAC breakfast   metoprolol succinate  50 mg Oral Daily   pantoprazole  40 mg Oral Daily   polyethylene glycol  17 g Oral BID   Continuous Infusions:        Aline August, MD [facility] 11/20/2022, 8:41 AM

## 2022-11-20 NOTE — Progress Notes (Signed)
Physical Therapy Treatment Patient Details Name: Sheila Jacobs MRN: [id number] DOB: 1950-10-07 Today's Date: 11/20/2022   History of Present Illness 72 year old female sent to the ED for evaluation of chest pain, nausea and vomiting; admitted 2/24. PMHX: past medical history significant for hypertension, hyperlipidemia, GERD, goiter, OSA, prior PE, arthritis, metastatic breast cancer.    PT Comments    Pt in recliner upon arrival, agreeable to therapy. Upon rising, pt noted to be soiled in the chair. Pt able to maintain static standing for up to 5 minutes with UE support on RW while therapist assists with pericare. Pt able to complete 3 STS reps from recliner, pulls therapist's hand to stand with each rep. Pt then takes steps forwards and backwards with RW, able to complete sidesteps up to [facility] before sitting EOB. Pt's daughter lifts pt's legs back into bed.   Recommendations for follow up therapy are one component of a multi-disciplinary discharge planning process, led by the attending physician.  Recommendations may be updated based on patient status, additional functional criteria and insurance authorization.  Follow Up Recommendations  Home health PT     Assistance Recommended at Discharge Intermittent Supervision/Assistance  Patient can return home with the following A little help with walking and/or transfers;A little help with bathing/dressing/bathroom;Assistance with cooking/housework;Assist for transportation;Help with stairs or ramp for entrance   Equipment Recommendations  None recommended by PT    Recommendations for Other Services       Precautions / Restrictions Precautions Precautions: Fall Precaution Comments: Very HOH; writing notes to communicate clearly Restrictions Weight Bearing Restrictions: No     Mobility  Bed Mobility   General bed mobility comments: daughter assists pt's BLE back into bed, reports she is a Chartered certified accountant in Memory care unit in [place] Overall transfer level: Needs assistance Equipment used: Rolling walker (2 wheels) Transfers: Sit to/from Stand Sit to Stand: Min assist  General transfer comment: pt pulls on therapist's hand to upright self into standing, noted to be soiled upon rising, able to maintain static standing with RW as therapist assists with pericare    Ambulation/Gait Ambulation/Gait assistance: Min guard Gait Distance (Feet): 6 Feet Assistive device: Rolling walker (2 wheels) Gait Pattern/deviations: Step-through pattern, Decreased stride length Gait velocity: decreased  General Gait Details: pt takes steps forward then backwards and able to take sidesteps up to [facility], fatigued from STS reps and cleaning   Stairs             Wheelchair Mobility    Modified Rankin (Stroke Patients Only)       Balance Overall balance assessment: Needs assistance  Sitting balance-Leahy Scale: Good  Standing balance support: Bilateral upper extremity supported, Reliant on assistive device for balance, During functional activity Standing balance-Leahy Scale: Poor     Cognition Arousal/Alertness: Awake/alert Behavior During Therapy: WFL for tasks assessed/performed Overall Cognitive Status: Difficult to assess  General Comments: follows written commands, able to voice needs; daughter at bedside reports pt lost hearing aids during last hospitalization so writing communication is new-ish        Exercises      General Comments        Pertinent Vitals/Pain Pain Assessment Pain Assessment: No/denies pain    Home Living                          Prior Function            PT Goals (current  goals can now be found in the care plan section) Acute Rehab PT Goals Patient Stated Goal: Did not state, but agreeable to getting up PT Goal Formulation: Patient unable to participate in goal setting Time For Goal Achievement: 11/21/22 Potential to Achieve Goals: Good Progress  towards PT goals: Progressing toward goals    Frequency    Min 3X/week      PT Plan Current plan remains appropriate    Co-evaluation              AM-PAC PT "6 Clicks" Mobility   Outcome Measure  Help needed turning from your back to your side while in a flat bed without using bedrails?: None Help needed moving from lying on your back to sitting on the side of a flat bed without using bedrails?: A Little Help needed moving to and from a bed to a chair (including a wheelchair)?: A Little Help needed standing up from a chair using your arms (e.g., wheelchair or bedside chair)?: A Little Help needed to walk in hospital room?: A Little Help needed climbing 3-5 steps with a railing? : A Lot 6 Click Score: 18    End of Session Equipment Utilized During Treatment: Gait belt Activity Tolerance: Patient tolerated treatment well Patient left: in bed;with call bell/phone within reach;with family/visitor present Nurse Communication: Mobility status;Other (comment) (soiled in recliner, purewick soiled and tossed) PT Visit Diagnosis: Other abnormalities of gait and mobility (R26.89);Muscle weakness (generalized) (M62.81)     Time: [id number] PT Time Calculation (min) (ACUTE ONLY): 24 min  Charges:  $Therapeutic Activity: 23-37 mins                      Tori Haskel Dewalt PT, DPT 11/20/22, 11:50 AM

## 2022-11-21 ENCOUNTER — Ambulatory Visit
Admit: 2022-11-21 | Discharge: 2022-11-21 | Disposition: A | Discharge status: Home / Self Care | Payer: 59 | Attending: Radiation Oncology | Admitting: Radiation Oncology

## 2022-11-21 ENCOUNTER — Other Ambulatory Visit: Payer: Self-pay

## 2022-11-21 ENCOUNTER — Inpatient Hospital Stay (HOSPITAL_COMMUNITY): Payer: 59

## 2022-11-21 ENCOUNTER — Other Ambulatory Visit: Payer: Self-pay | Admitting: Cardiology

## 2022-11-21 ENCOUNTER — Other Ambulatory Visit: Payer: Self-pay | Admitting: Hematology and Oncology

## 2022-11-21 DIAGNOSIS — J181 Lobar pneumonia, unspecified organism: Secondary | ICD-10-CM

## 2022-11-21 DIAGNOSIS — R112 Nausea with vomiting, unspecified: Secondary | ICD-10-CM | POA: Diagnosis not present

## 2022-11-21 LAB — RAD ONC ARIA SESSION SUMMARY
Course Elapsed Days: 8
Plan Fractions Treated to Date: 7
Plan Prescribed Dose Per Fraction: 3 Gy
Plan Total Fractions Prescribed: 10
Plan Total Prescribed Dose: 30 Gy
Reference Point Dosage Given to Date: 21 Gy
Reference Point Session Dosage Given: 3 Gy
Session Number: 7

## 2022-11-21 MED ORDER — IPRATROPIUM-ALBUTEROL 0.5-2.5 (3) MG/3ML IN SOLN
3.0000 mL | Freq: Four times a day (QID) | RESPIRATORY_TRACT | Status: DC
  Administered 2022-11-21 – 2022-11-24 (×11): 3 mL via RESPIRATORY_TRACT
  Filled 2022-11-21 (×12): qty 3

## 2022-11-21 MED ORDER — SODIUM CHLORIDE 0.9 % IV SOLN
2.0000 g | Freq: Three times a day (TID) | INTRAVENOUS | Status: DC
  Administered 2022-11-21 – 2022-11-24 (×8): 2 g via INTRAVENOUS
  Filled 2022-11-21 (×9): qty 12.5

## 2022-11-21 MED ORDER — HYDROCOD POLI-CHLORPHE POLI ER 10-8 MG/5ML PO SUER
5.0000 mL | Freq: Two times a day (BID) | ORAL | Status: DC
  Administered 2022-11-21 – 2022-11-23 (×5): 5 mL via ORAL
  Filled 2022-11-21 (×5): qty 5

## 2022-11-21 MED ORDER — GUAIFENESIN-DM 100-10 MG/5ML PO SYRP
5.0000 mL | ORAL_SOLUTION | Freq: Four times a day (QID) | ORAL | Status: DC
  Administered 2022-11-21 (×3): 5 mL via ORAL
  Filled 2022-11-21 (×4): qty 5

## 2022-11-21 NOTE — Progress Notes (Signed)
[facility] Progress Note  Patient: Sheila Jacobs     [id number]  DOA: 11/05/2022   PCP: Arthur Holms, NP       Brief hospital course: This is a 72 year old female with hypertension, OSA, metastatic breast cancer to the brain.  She presented for shortness of breath and chest pain along with nausea and vomiting. Nausea and vomiting resolved in the hospital.  She was noted to be confused and an MRI of the brain was performed in the hospital and this revealed extensive parenchymal brain metastasis and leptomeningeal involvement.  Radiation was started for total of 10 days. Hospital course was complicated by agitation.  She communicate by reading our text and then speaking in response.  Subjective:  She has pain across her upper abdomen and dyspnea despite just receiving a Neb treatment. She is coughing and states she has had a cough for 3 days. Her daughter [name] helps with the history.  Assessment and Plan: Principal Problem:   Nausea and vomiting -Possibly secondary to brain mets-has resolved Active Problems: Severe congested cough - Chest x-ray today shows right lower lobe infiltrate and extensive metastatic disease - Will start cefepime, Tussionex and Nebs QID    Breast cancer of upper-outer quadrant of left female breast stage IV - Has extensive metastatic disease in the lung liver and bone -Is receiving palliative radiation and last radiation will be on 11/25/2021 after which family is interested in taking her home with hospice  H/o PE - on eliqius     Code Status: DNR Consultants: oncology, palliative care Level of Care: Level of care: Med-Surg Total time on patient care: 35 min DVT prophylaxis:  Eliquis   Objective:   Vitals:   11/21/22 1032 11/21/22 1121 11/21/22 1224 11/21/22 1610  BP: 133/81  138/70   Pulse: 96  92   Resp:   20   Temp:   98.6 F (37 C)   TempSrc:   Oral   SpO2:  96% 96% 98%  Weight:      Height:       Filed Weights    11/05/22 1100 11/10/22 0413  Weight: 100.7 kg 108.7 kg   Exam: General exam: Appears comfortable  HEENT: oral mucosa moist Respiratory system: severe cough and rhonchi Cardiovascular system: S1 & S2 heard  Gastrointestinal system: Abdomen soft, non-tender, nondistended. Normal bowel sounds   Extremities: No cyanosis, clubbing or edema Psychiatry:  Mood & affect appropriate.      CBC: Recent Labs  Lab 11/20/22 0547  WBC 2.8*  HGB 10.0*  HCT 30.4*  MCV 103.8*  PLT 0000000   Basic Metabolic Panel: No results for input(s): "NA", "K", "CL", "CO2", "GLUCOSE", "BUN", "CREATININE", "CALCIUM", "MG", "PHOS" in the last 168 hours. GFR: Estimated Creatinine Clearance: 72.9 mL/min (by C-G formula based on SCr of 0.8 mg/dL).  Scheduled Meds:  apixaban  5 mg Oral BID   chlorpheniramine-HYDROcodone  5 mL Oral Q12H   dexamethasone  4 mg Oral Daily   feeding supplement  237 mL Oral BID BM   guaiFENesin-dextromethorphan  5 mL Oral QID   ipratropium-albuterol  3 mL Nebulization QID   levothyroxine  100 mcg Oral QAC breakfast   metoprolol succinate  50 mg Oral Daily   pantoprazole  40 mg Oral Daily   polyethylene glycol  17 g Oral BID   Continuous Infusions: Imaging and lab data was personally reviewed DG Chest Port 1 View  Result Date: 11/21/2022 CLINICAL DATA:  Cough EXAM: PORTABLE CHEST 1 VIEW  COMPARISON:  CT 11/05/2022, radiograph 11/05/2022, radiograph 11/14/2021 FINDINGS: Unchanged cardiomediastinal silhouette. Diffuse nodular opacities compatible with known metastatic disease. There is increased airspace disease in the right upper lung. Bones are unchanged with known sclerotic osseous metastases. IMPRESSION: Diffuse pulmonary metastatic disease, with increased airspace disease in the right lung suspicious for superimposed pneumonia. Electronically Signed   By: Maurine Simmering M.D.   On: 11/21/2022 12:43    LOS: 15 days   Author: Debbe Odea  11/21/2022 5:45 PM  To contact [facility]>   Check the care team in [facility] and look for the attending/consulting [place] provider listed  Log into [email or web address] and use [place]'s universal password   Go to> "[facility]"  and find provider  If you still have difficulty reaching the provider, please page the [facility] (Director on Call) for the Hospitalists listed on amion

## 2022-11-21 NOTE — Telephone Encounter (Signed)
Pt on letrozole

## 2022-11-21 NOTE — Progress Notes (Signed)
Pharmacy Antibiotic Note  ONNALEE BERRO is a 72 y.o. female with metastatic breast cancer  and hx PE on Eliquis PTA who presented to the ED on 11/05/2022 with c/o SOB and n/v. CXR on 11/21/22 showed findings with concern for PNA. Pharmacy has been consulted to dose cefepime for infection.   Plan: - cefepime 2gm IV q8h  _____________________________________  Height: '5\' 3"'$  (160 cm) Weight: 108.7 kg (239 lb 10.2 oz) IBW/kg (Calculated) : 52.4  Temp (24hrs), Avg:98.4 F (36.9 C), Min:98.2 F (36.8 C), Max:98.6 F (37 C)  Recent Labs  Lab 11/20/22 0547  WBC 2.8*    Estimated Creatinine Clearance: 72.9 mL/min (by C-G formula based on SCr of 0.8 mg/dL).    Allergies  Allergen Reactions   Cortisone Rash   Effexor [Venlafaxine] Palpitations   Lexapro [Escitalopram] Palpitations    Heart racing   Xarelto [Rivaroxaban] Rash     Thank you for allowing pharmacy to be a part of this patient's care.  Lynelle Doctor 11/21/2022 6:12 PM

## 2022-11-21 NOTE — Progress Notes (Signed)
  Daily Progress Note   Patient Name: Sheila Jacobs       Date: 11/21/2022 DOB: May 31, 1951  Age: 72 y.o. MRN#: [id number] Attending Physician: Debbe Odea, MD Primary Care Physician: Arthur Holms, NP Admit Date: Nov 12, 2022 Length of Stay: 15 days  Patient is scheduled to continue with WBRT daily, with last therapy on 11/25/21. Family had expressed possibly considering home with hospice after completion based on patient's outcomes. PMT will continue to follow along with patient's medical care. If acute needs arise, please reach out. Thank you.   Chelsea Aus, DO Palliative Care Provider PMT # [phone]

## 2022-11-21 NOTE — Plan of Care (Signed)

## 2022-11-21 NOTE — Telephone Encounter (Signed)
Prescription refill request for Eliquis received. Indication: PE Last office visit: 06/17/21  Sheila Long NP Scr: 0.80 on 11/10/22 Age: 72 Weight: 142.2kg  Based on above findings Eliquis '5mg'$  twice daily is the appropriate dose.  Refill approved x 1.  Past due for appt.  Message sent to schedulers to make appt.

## 2022-11-21 NOTE — Plan of Care (Signed)
  Problem: Education: Goal: Knowledge of General Education information will improve Description: Including pain rating scale, medication(s)/side effects and non-pharmacologic comfort measures Outcome: Progressing   Problem: Clinical Measurements: Goal: Respiratory complications will improve Outcome: Progressing   Problem: Activity: Goal: Risk for activity intolerance will decrease Outcome: Progressing   Problem: Nutrition: Goal: Adequate nutrition will be maintained Outcome: Progressing   Problem: Coping: Goal: Level of anxiety will decrease Outcome: Progressing   Problem: Pain Managment: Goal: General experience of comfort will improve Outcome: Progressing   Problem: Safety: Goal: Ability to remain free from injury will improve Outcome: Progressing   Problem: Skin Integrity: Goal: Risk for impaired skin integrity will decrease Outcome: Progressing   

## 2022-11-22 ENCOUNTER — Ambulatory Visit
Admit: 2022-11-22 | Discharge: 2022-11-22 | Disposition: A | Discharge status: Home / Self Care | Payer: 59 | Attending: Radiation Oncology | Admitting: Radiation Oncology

## 2022-11-22 ENCOUNTER — Other Ambulatory Visit: Payer: Self-pay

## 2022-11-22 DIAGNOSIS — U071 COVID-19: Secondary | ICD-10-CM

## 2022-11-22 DIAGNOSIS — R112 Nausea with vomiting, unspecified: Secondary | ICD-10-CM | POA: Diagnosis not present

## 2022-11-22 LAB — RAD ONC ARIA SESSION SUMMARY
Course Elapsed Days: 9
Plan Fractions Treated to Date: 8
Plan Prescribed Dose Per Fraction: 3 Gy
Plan Total Fractions Prescribed: 10
Plan Total Prescribed Dose: 30 Gy
Reference Point Dosage Given to Date: 24 Gy
Reference Point Session Dosage Given: 3 Gy
Session Number: 8

## 2022-11-22 LAB — RESP PANEL BY RT-PCR (RSV, FLU A&B, COVID)  RVPGX2
Influenza A by PCR: NEGATIVE
Influenza B by PCR: NEGATIVE
Resp Syncytial Virus by PCR: NEGATIVE
SARS Coronavirus 2 by RT PCR: POSITIVE — AB

## 2022-11-22 LAB — BASIC METABOLIC PANEL
Anion gap: 10 (ref 5–15)
BUN: 7 mg/dL — ABNORMAL LOW (ref 8–23)
CO2: 24 mmol/L (ref 22–32)
Calcium: 8 mg/dL — ABNORMAL LOW (ref 8.9–10.3)
Chloride: 108 mmol/L (ref 98–111)
Creatinine, Ser: 0.48 mg/dL (ref 0.44–1.00)
GFR, Estimated: >60 mL/min (ref >60)
Glucose, Bld: 94 mg/dL (ref 70–99)
Potassium: 3.7 mmol/L (ref 3.5–5.1)
Sodium: 142 mmol/L (ref 135–145)

## 2022-11-22 MED ORDER — GUAIFENESIN-DM 100-10 MG/5ML PO SYRP
5.0000 mL | ORAL_SOLUTION | Freq: Four times a day (QID) | ORAL | Status: DC
  Administered 2022-11-22 – 2022-11-23 (×6): 5 mL via ORAL
  Filled 2022-11-22: qty 5
  Filled 2022-11-22 (×4): qty 10
  Filled 2022-11-22: qty 5
  Filled 2022-11-22: qty 10
  Filled 2022-11-22: qty 5

## 2022-11-22 NOTE — Progress Notes (Signed)
[facility] Progress Note  Patient: Sheila Jacobs     [id number]  DOA: 11/05/2022   PCP: Arthur Holms, NP       Brief hospital course: This is a 72 year old female with hypertension, OSA, metastatic breast cancer to the brain.  She presented for shortness of breath and chest pain along with nausea and vomiting. Nausea and vomiting resolved in the hospital.  She was noted to be confused and an MRI of the brain was performed in the hospital and this revealed extensive parenchymal brain metastasis and leptomeningeal involvement.  Radiation was started for total of 10 days. Hospital course was complicated by agitation.  She communicate by reading our text and then speaking in response.   Subjective:  Cough has not improved. She is not complaining of upper abdominal pain or dyspnea today as she was yesterday.   Assessment and Plan: Principal Problem:   Nausea and vomiting -Possibly secondary to brain mets-has resolved  Active Problems: Severe congested cough. RLL pneumonia, COVID + - Chest x-ray today shows right lower lobe infiltrate and extensive metastatic disease - 3/13> started cefepime, Tussionex and Nebs QID - not a candidate for Paxlovid due to being on Eliquis    Breast cancer of upper-outer quadrant of left female breast stage IV - Has extensive metastatic disease in the lung liver and bone -Is receiving palliative radiation and last radiation will be on 11/25/2021 after which family is interested in taking her home with hospice  H/o PE - on eliqius     Code Status: DNR Consultants: oncology, palliative care Level of Care: Level of care: Med-Surg Total time on patient care: 35 min DVT prophylaxis:  Eliquis   Objective:   Vitals:   11/22/22 1011 11/22/22 1133 11/22/22 1400 11/22/22 1510  BP: (!) 166/77  (!) 146/78   Pulse: 83  92 84  Resp:   (!) 24 20  Temp: 98.4 F (36.9 C)  98.6 F (37 C)   TempSrc: Oral     SpO2: 90% 100% 98% 93%  Weight:       Height:       Filed Weights   11/05/22 1100 11/10/22 0413  Weight: 100.7 kg 108.7 kg   Exam: General exam: Appears comfortable  HEENT: oral mucosa moist Respiratory system: rhonchi and cough Cardiovascular system: S1 & S2 heard  Gastrointestinal system: Abdomen soft, non-tender, nondistended. Normal bowel sounds   Extremities: No cyanosis, clubbing or edema Psychiatry:  Mood & affect appropriate.     CBC: Recent Labs  Lab 11/20/22 0547  WBC 2.8*  HGB 10.0*  HCT 30.4*  MCV 103.8*  PLT 0000000    Basic Metabolic Panel: Recent Labs  Lab 11/22/22 0558  NA 142  K 3.7  CL 108  CO2 24  GLUCOSE 94  BUN 7*  CREATININE 0.48  CALCIUM 8.0*   GFR: Estimated Creatinine Clearance: 72.9 mL/min (by C-G formula based on SCr of 0.48 mg/dL).  Scheduled Meds:  apixaban  5 mg Oral BID   chlorpheniramine-HYDROcodone  5 mL Oral Q12H   dexamethasone  4 mg Oral Daily   feeding supplement  237 mL Oral BID BM   guaiFENesin-dextromethorphan  5 mL Oral QID   ipratropium-albuterol  3 mL Nebulization QID   levothyroxine  100 mcg Oral QAC breakfast   metoprolol succinate  50 mg Oral Daily   pantoprazole  40 mg Oral Daily   polyethylene glycol  17 g Oral BID   Continuous Infusions:  ceFEPime (MAXIPIME) IV 2  g (11/22/22 1021)   Imaging and lab data was personally reviewed DG Chest Port 1 View  Result Date: 11/21/2022 CLINICAL DATA:  Cough EXAM: PORTABLE CHEST 1 VIEW COMPARISON:  CT 11/05/2022, radiograph 11/05/2022, radiograph 11/14/2021 FINDINGS: Unchanged cardiomediastinal silhouette. Diffuse nodular opacities compatible with known metastatic disease. There is increased airspace disease in the right upper lung. Bones are unchanged with known sclerotic osseous metastases. IMPRESSION: Diffuse pulmonary metastatic disease, with increased airspace disease in the right lung suspicious for superimposed pneumonia. Electronically Signed   By: Maurine Simmering M.D.   On: 11/21/2022 12:43    LOS: 16  days   Author: Eunice Blase Elyanna Wallick  11/22/2022 3:30 PM  To contact [facility]>   Check the care team in [facility] and look for the attending/consulting [place] provider listed  Log into [email or web address] and use [place]'s universal password   Go to> "[facility]"  and find provider  If you still have difficulty reaching the provider, please page the [facility] (Director on Call) for the Hospitalists listed on amion

## 2022-11-22 NOTE — Progress Notes (Signed)
PHARMACY NOTE -  Cefepime  Pharmacy has been assisting with dosing of cefepime for pneumonia. Dosage remains stable at 2g IV q8 hr and further renal adjustments per institutional Pharmacy antibiotic protocol  Pharmacy will sign off, following peripherally for culture results, dose adjustments, and length of therapy. Please reconsult if a change in clinical status warrants re-evaluation of dosage.  Reuel Boom, PharmD, BCPS [phone] 11/22/2022, 8:07 AM

## 2022-11-22 NOTE — Progress Notes (Signed)
Rn called pt floor nurse Manuela Schwartz to ensure pt is still stable for treatment today based on the new finding of being Covid positive. Manuela Schwartz stated that the pt was tested out of concern for her cough and that the pt remained stable.  Manuela Schwartz stated the pt was still good for treatment this afternoon.

## 2022-11-23 ENCOUNTER — Inpatient Hospital Stay (HOSPITAL_COMMUNITY): Payer: 59

## 2022-11-23 ENCOUNTER — Ambulatory Visit: Admit: 2022-11-23 | Payer: 59

## 2022-11-23 DIAGNOSIS — U071 COVID-19: Secondary | ICD-10-CM | POA: Diagnosis not present

## 2022-11-23 DIAGNOSIS — R112 Nausea with vomiting, unspecified: Secondary | ICD-10-CM | POA: Diagnosis not present

## 2022-11-23 LAB — BLOOD GAS, ARTERIAL
Acid-Base Excess: 1 mmol/L (ref 0.0–2.0)
Bicarbonate: 25.2 mmol/L (ref 20.0–28.0)
Drawn by: 66453
FIO2: 32 %
O2 Content: 3 L/min
O2 Saturation: 89.7 %
Patient temperature: 36.9
pCO2 arterial: 38 mmHg (ref 32–48)
pH, Arterial: 7.43 (ref 7.35–7.45)
pO2, Arterial: 59 mmHg — ABNORMAL LOW (ref 83–108)

## 2022-11-23 LAB — MRSA NEXT GEN BY PCR, NASAL: MRSA by PCR Next Gen: NOT DETECTED

## 2022-11-23 MED ORDER — SODIUM CHLORIDE (PF) 0.9 % IJ SOLN
INTRAMUSCULAR | Status: AC
  Filled 2022-11-23: qty 50

## 2022-11-23 MED ORDER — LORAZEPAM 1 MG PO TABS
1.0000 mg | ORAL_TABLET | ORAL | Status: DC | PRN
  Administered 2022-11-23: 1 mg via ORAL
  Filled 2022-11-23: qty 1

## 2022-11-23 MED ORDER — CHLORHEXIDINE GLUCONATE CLOTH 2 % EX PADS
6.0000 | MEDICATED_PAD | Freq: Every day | CUTANEOUS | Status: DC
  Administered 2022-11-23: 6 via TOPICAL

## 2022-11-23 MED ORDER — LORAZEPAM 2 MG/ML IJ SOLN
0.5000 mg | Freq: Once | INTRAMUSCULAR | Status: AC
  Administered 2022-11-23: 0.5 mg via INTRAVENOUS
  Filled 2022-11-23: qty 1

## 2022-11-23 MED ORDER — SODIUM CHLORIDE 0.9 % IV SOLN
6.0000 mg | Freq: Once | INTRAVENOUS | Status: AC
  Administered 2022-11-24: 6 mg via INTRAVENOUS
  Filled 2022-11-23: qty 0.6

## 2022-11-23 MED ORDER — IOHEXOL 350 MG/ML SOLN
75.0000 mL | Freq: Once | INTRAVENOUS | Status: AC | PRN
  Administered 2022-11-23: 75 mL via INTRAVENOUS

## 2022-11-23 MED ORDER — DEXAMETHASONE 6 MG PO TABS: 6.0000 mg | ORAL_TABLET | Freq: Two times a day (BID) | ORAL | Status: DC

## 2022-11-23 NOTE — Progress Notes (Signed)
RT attempted to place pt on bipap, pt insisted RT take it off, RT will notify RN

## 2022-11-23 NOTE — Progress Notes (Signed)
PT Cancellation Note  Patient Details Name: Sheila Jacobs MRN: [id number] DOB: 04-15-1951   Cancelled Treatment:     Pt transferred to step down.  Pt has been evaluated.  Plans to return home with Hospice.  Will attempt to see another day as schedule permits.   Rica Koyanagi  PTA Acute  Rehabilitation Services Office M-F          [phone] Weekend pager [phone]

## 2022-11-23 NOTE — TOC Progression Note (Signed)
Transition of Care [facility]) - Progression Note    Patient Details  Name: Sheila Jacobs MRN: [id number] Date of Birth: 1951-06-17  Transition of Care [facility]) CM/SW Contact  Servando Snare, [place] Phone Number: 11/23/2022, 2:09 PM  Clinical Narrative:    TOC following for disposition.         Expected Discharge Plan and Services                                               Social Determinants of Health (SDOH) Interventions SDOH Screenings   Food Insecurity: No Food Insecurity (11/07/2022)  Housing: Low Risk  (11/07/2022)  Transportation Needs: No Transportation Needs (11/07/2022)  Utilities: Not At Risk (11/07/2022)  Depression (PHQ2-9): Low Risk  (04/06/2019)  Tobacco Use: Medium Risk (10/27/2022)    Readmission Risk Interventions     No data to display

## 2022-11-23 NOTE — Progress Notes (Signed)
Pt had some oxygenation issues while down in CT. Once pt returned to the unit RT placed pt on 10L [place]. RN update NP on pt.

## 2022-11-23 NOTE — Progress Notes (Signed)
Pt not currently on bipap. Pt on 5L [place] at this time, seems to be tolerating well. RN aware.

## 2022-11-23 NOTE — Progress Notes (Incomplete)
   CROSS COVER NOTE    Patient Name: Sheila Jacobs           DOB: March 20, 1951  MRN: [id number]      Admission Date: 10/12/2022  Attending Provider: Debbe Odea, MD  Primary Diagnosis: Nausea and vomiting   Level of care: Stepdown    Date of Service   11/23/2022   [name], 72 y.o. female, was admitted on 10/14/2022 for Nausea and vomiting.    HPI/Events of Note   2100- notified by RN of patient with increased agitation and restlessness.  Patient has removed BiPAP and O2 sat dropped to 70-80%. Patient was placed on nonrebreather and now has optimal oxygen saturation.  Despite change of oxygen equipment, patient is continuously attempting to remove medical devices.  Earlier today, she received oral Ativan dose which was effective.  Patient at this time is noncompliant, hard to redirect, and is refusing oral medicine.  Will order IV Ativan. For the safety of the patient, wrist restraints have been placed. Restraint to be discontinued as soon as patient is able to safely have them removed.   If patient is able to calm down, RN may reattempt BiPAP therapy without restraint use. If unable to use BiPAP, consider high flow o2 device and titrate.    Interventions/ Plan   IV ativan Temporary restraint use, family at bedside        Raenette Rover, [place], [place]

## 2022-11-23 NOTE — Progress Notes (Signed)
[facility] Progress Note  Patient: Sheila Jacobs     [id number]  DOA: 11/07/2022   PCP: Arthur Holms, NP       Brief hospital course: This is a 72 year old female with hypertension, OSA, metastatic breast cancer to the brain.  She presented for shortness of breath and chest pain along with nausea and vomiting. Nausea and vomiting resolved in the hospital.  She was noted to be confused and an MRI of the brain was performed in the hospital and this revealed extensive parenchymal brain metastasis and leptomeningeal involvement.  Radiation was started for total of 10 days. Hospital course was complicated by agitation.  She communicate by reading our text and then speaking in response.   Subjective:  Short of breath.  Assessment and Plan: Principal Problem:   Nausea and vomiting -Possibly secondary to brain mets-has resolved  Active Problems: Severe congested cough. RLL pneumonia, COVID + - Chest x-ray today shows right lower lobe infiltrate and extensive metastatic disease - 3/13> started cefepime, Tussionex and Nebs QID - not a candidate for Paxlovid due to being on Eliquis 3/15> more visibly short of breath and has tachypnea- obtain CT of the chest, move to SDU and start BiPAP - increase Decadron from 4 mg daily to 6 mig BID  Agitated last night per RN - required Morphine - add Ativan PRN    Breast cancer of upper-outer quadrant of left female breast stage IV - Has extensive metastatic disease in the lung liver and bone -Is receiving palliative radiation and last radiation will be on 11/25/2021 after which family is interested in taking her home with hospice  H/o PE - on eliqius     Code Status: DNR Consultants: oncology, palliative care Level of Care: Level of care: Stepdown Total time on patient care: 35 min DVT prophylaxis:  Eliquis   Objective:   Vitals:   11/22/22 2050 11/22/22 2206 11/23/22 0558 11/23/22 0925  BP:  (!) 156/86 (!) 146/71   Pulse:   86 85   Resp:  18 18   Temp:   98.5 F (36.9 C)   TempSrc:   Oral   SpO2: 98% 96% 93% 94%  Weight:      Height:       Filed Weights   10/19/2022 1100 11/10/22 0413  Weight: 100.7 kg 108.7 kg   Exam: General exam: Appears comfortable  HEENT: oral mucosa moist Respiratory system:  tachypnea, using abdomen to breath Cardiovascular system: S1 & S2 heard  Gastrointestinal system: Abdomen soft, non-tender, nondistended. Normal bowel sounds   Extremities: No cyanosis, clubbing or edema Psychiatry:  Mood & affect appropriate.      CBC: Recent Labs  Lab 11/20/22 0547  WBC 2.8*  HGB 10.0*  HCT 30.4*  MCV 103.8*  PLT 0000000    Basic Metabolic Panel: Recent Labs  Lab 11/22/22 0558  NA 142  K 3.7  CL 108  CO2 24  GLUCOSE 94  BUN 7*  CREATININE 0.48  CALCIUM 8.0*    GFR: Estimated Creatinine Clearance: 72.9 mL/min (by C-G formula based on SCr of 0.48 mg/dL).  Scheduled Meds:  apixaban  5 mg Oral BID   chlorpheniramine-HYDROcodone  5 mL Oral Q12H   dexamethasone  4 mg Oral Daily   feeding supplement  237 mL Oral BID BM   guaiFENesin-dextromethorphan  5 mL Oral QID   ipratropium-albuterol  3 mL Nebulization QID   levothyroxine  100 mcg Oral QAC breakfast   metoprolol succinate  50 mg  Oral Daily   pantoprazole  40 mg Oral Daily   polyethylene glycol  17 g Oral BID   Continuous Infusions:  ceFEPime (MAXIPIME) IV 2 g (11/23/22 [id number])   Imaging and lab data was personally reviewed DG Chest Port 1 View  Result Date: 11/21/2022 CLINICAL DATA:  Cough EXAM: PORTABLE CHEST 1 VIEW COMPARISON:  CT 11/04/2022, radiograph 10/26/2022, radiograph 11/14/2021 FINDINGS: Unchanged cardiomediastinal silhouette. Diffuse nodular opacities compatible with known metastatic disease. There is increased airspace disease in the right upper lung. Bones are unchanged with known sclerotic osseous metastases. IMPRESSION: Diffuse pulmonary metastatic disease, with increased airspace disease in the  right lung suspicious for superimposed pneumonia. Electronically Signed   By: Maurine Simmering M.D.   On: 11/21/2022 12:43    LOS: 17 days   Author: Debbe Odea  11/23/2022 11:55 AM  To contact [facility]>   Check the care team in [facility] and look for the attending/consulting [place] provider listed  Log into [email or web address] and use [place]'s universal password   Go to> "[facility]"  and find provider  If you still have difficulty reaching the provider, please page the [facility] (Director on Call) for the Hospitalists listed on amion

## 2022-11-23 NOTE — Progress Notes (Signed)
Pt placed on 7lpm [place] hfnc due to sats 86% on 3lpm, pt tolerating well at this time sats 95%

## 2022-11-24 DIAGNOSIS — Z515 Encounter for palliative care: Secondary | ICD-10-CM

## 2022-11-24 DIAGNOSIS — C50412 Malignant neoplasm of upper-outer quadrant of left female breast: Secondary | ICD-10-CM | POA: Diagnosis not present

## 2022-11-24 DIAGNOSIS — R52 Pain, unspecified: Secondary | ICD-10-CM

## 2022-11-24 DIAGNOSIS — R0602 Shortness of breath: Secondary | ICD-10-CM

## 2022-11-24 DIAGNOSIS — Z7189 Other specified counseling: Secondary | ICD-10-CM

## 2022-11-24 DIAGNOSIS — U071 COVID-19: Secondary | ICD-10-CM | POA: Diagnosis not present

## 2022-11-24 DIAGNOSIS — Z79899 Other long term (current) drug therapy: Secondary | ICD-10-CM

## 2022-11-24 DIAGNOSIS — Z66 Do not resuscitate: Secondary | ICD-10-CM

## 2022-11-24 MED ORDER — SODIUM CHLORIDE 0.9 % IV SOLN: INTRAVENOUS | Status: DC | PRN

## 2022-11-24 MED ORDER — HALOPERIDOL LACTATE 5 MG/ML IJ SOLN: 0.5000 mg | INTRAMUSCULAR | Status: DC | PRN

## 2022-11-24 MED ORDER — BIOTENE DRY MOUTH MT LIQD: 15.0000 mL | OROMUCOSAL | Status: DC | PRN

## 2022-11-24 MED ORDER — HALOPERIDOL LACTATE 2 MG/ML PO CONC: 0.5000 mg | ORAL | Status: DC | PRN

## 2022-11-24 MED ORDER — ONDANSETRON HCL 4 MG/2ML IJ SOLN: 4.0000 mg | Freq: Four times a day (QID) | INTRAMUSCULAR | Status: DC | PRN

## 2022-11-24 MED ORDER — SODIUM CHLORIDE 0.9% FLUSH: 3.0000 mL | Freq: Two times a day (BID) | INTRAVENOUS | Status: DC

## 2022-11-24 MED ORDER — SODIUM CHLORIDE 0.9% FLUSH: 3.0000 mL | INTRAVENOUS | Status: DC | PRN

## 2022-11-24 MED ORDER — GLYCOPYRROLATE 0.2 MG/ML IJ SOLN: 0.2000 mg | INTRAMUSCULAR | Status: DC | PRN

## 2022-11-24 MED ORDER — ACETAMINOPHEN 325 MG PO TABS: 650.0000 mg | ORAL_TABLET | Freq: Four times a day (QID) | ORAL | Status: DC | PRN

## 2022-11-24 MED ORDER — GLYCOPYRROLATE 0.2 MG/ML IJ SOLN
0.2000 mg | INTRAMUSCULAR | Status: DC | PRN
  Administered 2022-11-24: 0.2 mg via INTRAVENOUS

## 2022-11-24 MED ORDER — POLYVINYL ALCOHOL 1.4 % OP SOLN: 1.0000 [drp] | Freq: Four times a day (QID) | OPHTHALMIC | Status: DC | PRN

## 2022-11-24 MED ORDER — ONDANSETRON 4 MG PO TBDP: 4.0000 mg | ORAL_TABLET | Freq: Four times a day (QID) | ORAL | Status: DC | PRN

## 2022-11-24 MED ORDER — HALOPERIDOL 1 MG PO TABS: 0.5000 mg | ORAL_TABLET | ORAL | Status: DC | PRN

## 2022-11-24 MED ORDER — MORPHINE BOLUS VIA INFUSION
3.0000 mg | INTRAVENOUS | Status: DC | PRN
  Administered 2022-11-24 (×4): 3 mg via INTRAVENOUS

## 2022-11-24 MED ORDER — DEXAMETHASONE SODIUM PHOSPHATE 10 MG/ML IJ SOLN: 6.0000 mg | Freq: Two times a day (BID) | INTRAMUSCULAR | Status: DC

## 2022-11-24 MED ORDER — MAGIC MOUTHWASH: 15.0000 mL | Freq: Four times a day (QID) | ORAL | Status: DC | PRN

## 2022-11-24 MED ORDER — GLYCOPYRROLATE 0.2 MG/ML IJ SOLN
0.2000 mg | INTRAMUSCULAR | Status: DC | PRN
  Filled 2022-11-24: qty 1

## 2022-11-24 MED ORDER — LORAZEPAM 2 MG/ML IJ SOLN
0.5000 mg | Freq: Once | INTRAMUSCULAR | Status: AC
  Administered 2022-11-24: 0.5 mg via INTRAVENOUS
  Filled 2022-11-24: qty 1

## 2022-11-24 MED ORDER — LORAZEPAM 2 MG/ML IJ SOLN
1.0000 mg | INTRAMUSCULAR | Status: DC | PRN
  Administered 2022-11-24: 1 mg via INTRAVENOUS
  Filled 2022-11-24: qty 1

## 2022-11-24 MED ORDER — HALOPERIDOL LACTATE 5 MG/ML IJ SOLN: 1.0000 mg | INTRAMUSCULAR | Status: DC | PRN

## 2022-11-24 MED ORDER — SODIUM CHLORIDE 0.9 % IV SOLN: 250.0000 mL | INTRAVENOUS | Status: DC | PRN

## 2022-11-24 MED ORDER — MORPHINE 100MG IN NS 100ML (1MG/ML) PREMIX INFUSION
2.0000 mg/h | INTRAVENOUS | Status: DC
  Administered 2022-11-24: 2 mg/h via INTRAVENOUS
  Filled 2022-11-24: qty 100

## 2022-11-24 MED ORDER — ACETAMINOPHEN 650 MG RE SUPP: 650.0000 mg | Freq: Four times a day (QID) | RECTAL | Status: DC | PRN

## 2022-11-24 MED ORDER — FUROSEMIDE 10 MG/ML IJ SOLN: 40.0000 mg | Freq: Once | INTRAMUSCULAR | Status: DC

## 2022-11-24 MED ORDER — GLYCOPYRROLATE 1 MG PO TABS: 1.0000 mg | ORAL_TABLET | ORAL | Status: DC | PRN

## 2022-11-24 NOTE — TOC Progression Note (Signed)
Transition of Care [facility]) - Progression Note    Patient Details  Name: Sheila Jacobs MRN: [id number] Date of Birth: 1951-02-09  Transition of Care [facility]) CM/SW Contact  Henrietta Dine, RN Phone Number: , 4:32 PM  Clinical Narrative:    Pt transitioned to comfort care; anticipate hospital death; TOC will sign off.        Expected Discharge Plan and Services                                               Social Determinants of Health (SDOH) Interventions SDOH Screenings   Food Insecurity: No Food Insecurity (11/07/2022)  Housing: Low Risk  (11/07/2022)  Transportation Needs: No Transportation Needs (11/07/2022)  Utilities: Not At Risk (11/07/2022)  Depression (PHQ2-9): Low Risk  (04/06/2019)  Tobacco Use: Medium Risk (10/17/2022)    Readmission Risk Interventions     No data to display

## 2022-11-24 NOTE — Progress Notes (Signed)
Patient's family called out notifying staff that patient is very agitated even after PRN morphine given. Patient trying to get out of restraints/out of bed. Moaning/crying out and not redirectable. PRN ativan given and palliative team notified about changes.

## 2022-11-24 NOTE — Progress Notes (Signed)
[facility] Progress Note  Patient: Sheila Jacobs     [id number]  DOA: 10/27/2022   PCP: Arthur Holms, NP       Brief hospital course: This is a 72 year old female with hypertension, OSA, metastatic breast cancer to the brain.  She presented for shortness of breath and chest pain along with nausea and vomiting. Nausea and vomiting resolved in the hospital.  She was noted to be confused and an MRI of the brain was performed in the hospital and this revealed extensive parenchymal brain metastasis and leptomeningeal involvement.  Radiation was started for total of 10 days. Hospital course was complicated by agitation.  She communicates by reading our text and then speaking in response.   Subjective:  Unresponsive   Assessment and Plan: Principal Problem:   Nausea and vomiting -Possibly secondary to brain mets-has resolved  Active Problems: Severe congested cough. RLL pneumonia, COVID + - Chest x-ray today shows right lower lobe infiltrate and extensive metastatic disease - 3/13> started cefepime, Tussionex and Nebs QID - not a candidate for Paxlovid due to being on Eliquis 3/15> more visibly short of breath and has tachypnea- moved to SDU and started on BiPAP - increased Decadron from 4 mg daily to 6 mig BID - unable to tolerate BiPAP- now transitioned to comfort care- appreciate assistance from palliative care team  Intermittent agitation - PRN Ativan and Haldol    Breast cancer of upper-outer quadrant of left female breast stage IV - Has extensive metastatic disease in the lung liver and bone -Is receiving palliative radiation and last radiation will be on 11/25/2021 after which family is interested in taking her home with hospice  H/o PE - on eliqius     Code Status: DNR Consultants: oncology, palliative care Level of Care: Level of care: Stepdown Total time on patient care: 35 min DVT prophylaxis:  Eliquis   Objective:   Vitals:    0900   1000  1100  1146  BP: 116/86 138/85    Pulse: 91 91 85   Resp: 11 10 (!) 9   Temp:      TempSrc:      SpO2: 91% 93% 91% 91%  Weight:      Height:       Filed Weights   11/01/2022 1100 11/10/22 0413  Weight: 100.7 kg 108.7 kg   Exam: General exam: unresponsive Respiratory system: rapid breathing  Cardiovascular system: S1 & S2 heard  Gastrointestinal system: Abdomen soft, non-tender, nondistended. Normal bowel sounds   Extremities: No cyanosis, clubbing or edema  CBC: Recent Labs  Lab 11/20/22 0547  WBC 2.8*  HGB 10.0*  HCT 30.4*  MCV 103.8*  PLT 0000000    Basic Metabolic Panel: Recent Labs  Lab 11/22/22 0558  NA 142  K 3.7  CL 108  CO2 24  GLUCOSE 94  BUN 7*  CREATININE 0.48  CALCIUM 8.0*    GFR: Estimated Creatinine Clearance: 72.9 mL/min (by C-G formula based on SCr of 0.48 mg/dL).  Scheduled Meds:   Continuous Infusions:  sodium chloride 10 mL/hr at  0434   morphine 2 mg/hr ( 1039)   Imaging and lab data was personally reviewed CT Angio Chest Pulmonary Embolism (PE) W or WO Contrast  Result Date: 11/23/2022 CLINICAL DATA:  Shortness of breath EXAM: CT ANGIOGRAPHY CHEST WITH CONTRAST TECHNIQUE: Multidetector CT imaging of the chest was performed using the standard protocol during bolus administration of intravenous contrast. Multiplanar CT image reconstructions and MIPs were obtained to  evaluate the vascular anatomy. RADIATION DOSE REDUCTION: This exam was performed according to the departmental dose-optimization program which includes automated exposure control, adjustment of the mA and/or kV according to patient size and/or use of iterative reconstruction technique. CONTRAST:  <See Chart> OMNIPAQUE IOHEXOL 350 MG/ML SOLN COMPARISON:  Chest x-ray 11/21/2022, chest CT 11/07/2022, chest CT 08/12/2021, 10/31/2021 FINDINGS: Cardiovascular: Satisfactory opacification of the pulmonary arteries to the proximal segmental level.  No evidence of pulmonary embolism. Nondiagnostic for subsegmental emboli due to limited contrast opacification and motion artifact. Dilated pulmonary trunk and main pulmonary arteries, pulmonary trunk measures 4.4 cm. Moderate aortic atherosclerosis. No aneurysm. Small pericardial effusion. Mild coronary vascular calcification Mediastinum/Nodes: Midline trachea. Multiple enlarged supraclavicular nodes. Similar mildly enlarged paratracheal and subcarinal nodes. Esophagus within normal limits. Slightly enlarged low right axillary lymph nodes measuring up to 12 mm, previously 10 mm. Lungs/Pleura: New moderate bilateral pleural effusions. Extensive pulmonary metastatic disease with interval development of widespread bilateral consolidations and heterogeneous ground-glass densities. Upper Abdomen: Probable slight increase in size of dominant hypodense metastatic liver lesion measuring 2.8 cm, previously 2.3 cm. Incompletely visualized bilateral adrenal masses/enlargement. Musculoskeletal: Diffuse skeletal metastatic disease as before. Similar scattered subcutaneous nodules within the back soft tissues. Review of the MIP images confirms the above findings. IMPRESSION: 1. Negative for acute pulmonary embolus to the proximal segmental level. Nondiagnostic for subsegmental emboli due to limited contrast opacification and motion artifact. 2. New moderate bilateral pleural effusions. Extensive pulmonary metastatic disease with interval development of widespread bilateral consolidations and heterogeneous ground-glass densities, findings could be secondary to multifocal pneumonia or pulmonary edema. 3. Dilated pulmonary trunk and main pulmonary arteries consistent with pulmonary hypertension. 4. Probable slight increase in size of dominant metastatic liver lesion. Incompletely visualized bilateral adrenal masses/enlargement. Widespread skeletal metastatic disease. Similar supraclavicular and mediastinal metastatic disease. 5.  Aortic atherosclerosis. Aortic Atherosclerosis (ICD10-I70.0). Electronically Signed   By: Donavan Foil M.D.   On: 11/23/2022 23:51    LOS: 18 days   Author: Debbe Odea   4:05 PM  To contact [facility]>   Check the care team in [facility] and look for the attending/consulting [place] provider listed  Log into [email or web address] and use [place]'s universal password   Go to> "[facility]"  and find provider  If you still have difficulty reaching the provider, please page the [facility] (Director on Call) for the Hospitalists listed on amion

## 2022-11-24 NOTE — Progress Notes (Signed)
1615: Entered patients room to communicate with granddaughter at bedside changes noted in patients vital signs. Increased respirations, increased HR, (110's-120's), and decreased SpO2 (40's). Voiced to granddaughter if any family members would like to talk to the patient or see her the should be notified now. Notified palliative care team of change in condition.

## 2022-11-24 NOTE — Progress Notes (Signed)
Pt comfort care with impending death. Pt time of cardiac death 12-15-2200. 2 RN pronounced, this RN and Chestine Spore. Family notified (patient placement number given since family unsure of plans at this time) [facility] [place] notified, [place] notified. Grandson came to visiting at this time.

## 2022-11-24 NOTE — Progress Notes (Signed)
    OVERNIGHT PROGRESS REPORT  Notified by RN that patient is deceased as of 2200-12-22 Hrs.  Patient was DNR/CMO (comfort measures only)  2 RN verified.  Family was notified by RNs.     Gershon Cull MSNA ACNPC-AG Acute Care Nurse Practitioner [place]

## 2022-11-24 NOTE — Progress Notes (Signed)
  AM lab collection attempted by two phlebotomists at two different times and were unsuccessful at both attempts. On call provider Raenette Rover NP notified of above.   Patient unable to take PO meds (Eliquis, cough medicine (2) and Miralax at 2200 and Synthroid at 0600), as patient is either agitated and combative, or somnolent after pain medication and/or anti-anxiety medications given. On call provider A. Zebedee Iba NP notified of above.  Cindy S. Brigitte Pulse BSN, RN, [facility]  6:44 AM

## 2022-11-24 NOTE — Progress Notes (Signed)
Daily Progress Note   Patient Name: Sheila Jacobs       Date:  DOB: [birth year]  Age: 72 y.o. MRN#: [id number] Attending Physician: Debbe Odea, MD Primary Care Physician: Arthur Holms, NP Admit Date: 10/30/2022  Reason for Consultation/Follow-up: Goals of care and symptom management in the setting of metastatic cancer  Patient Profile/HPI: 72 y.o. female  with past medical history of HTN, HLD, GERD, goiter, OSA, prior PE, arthritis, and metastatic breast cancer admitted on 11/04/2022 with chest pain, nausea and vomiting.     Patient had made decision to continue whole brain radiation with hope that it would allow her to have more time.     On 11/23/2022, patient's condition began to deteriorate rapidly. She tested COVID-19 positive. She was transferred to stepdown unit and placed on BiPAP. Her condition remains tenuous. She has been unable to tolerate BiPAP and lab draws. She is requiring intravenous medications for the management of pain, anxiety, agitation, and dyspnea.   Subjective: Extensive medical record review and discussion with attending, bedside nursing staff, and palliative team prior to seeing patient.  Presented to bedside for visit. There is a grandson asleep at bedside. Patient observed with eyes closed. She does not speak or follow commands but does seem aware of this student's presence through small facial muscle movements.   Patient is unable to participate in ROS. Breathing is open-mouth and labored. Accessory muscle use noted. Oral mucous membrane and lips are dry and peeling. Oral care is performed for patient's comfort.  Call placed to daughter Kimera Papageorgiou. Approached with empathetic discussion regarding the deterioration of patient's status over the past 24  hours, including her inability to tolerate BiPAP. [name] verbalizes understanding that hospital death is anticipated and requests that her mother be kept comfortable.   Physical Exam Constitutional:      General: She is in acute distress.  Cardiovascular:     Comments: Generalized edema. Pulmonary:     Effort: Tachypnea and accessory muscle usage present.  Skin:    Coloration: Skin is pale.     Comments: Mucous membranes pale, dry, and peeling.  Neurological:     Mental Status: She is lethargic.     Comments: Unable to follow commands.            Vital Signs: BP 116/86   Pulse 91   Temp 98.2 F (36.8 C) (Oral)   Resp 11   Ht 5\' 3"  (1.6 m)   Wt 108.7 kg   SpO2 91%   BMI 42.45 kg/m  SpO2: SpO2: 91 % O2 Device: O2 Device: High Flow Nasal Cannula O2 Flow Rate: O2 Flow Rate (L/min): 10 L/min  Intake/output summary:  Intake/Output Summary (Last 24 hours) at  0921 Last data filed at  [id number] Gross per 24 hour  Intake 287.58 ml  Output 1275 ml  Net -987.42 ml   LBM: Last BM Date : 11/22/22 Baseline Weight: Weight: 100.7 kg Most recent weight: Weight: 108.7 kg       Palliative Assessment/Data: 10%      Patient Active Problem List   Diagnosis Date Noted   COVID-19 virus infection 11/22/2022   General weakness 11/14/2022   Metastatic malignant neoplasm ([place])  11/11/2022   Need for emotional support 11/11/2022   Goals of care, counseling/discussion 11/11/2022   Palliative care by specialist 11/11/2022   Cancer of left breast metastatic to brain [facility]) 11/08/2022   Hyponatremia 11/06/2022   Nausea and vomiting 10/19/2022   Hypokalemia 11/01/2022   FTT (failure to thrive) in adult 10/28/2022   Hyperkalemia 10/22/2022   Hearing deficit 02/09/2022   Multiple pulmonary nodules 10/19/2021   Heel callus 06/14/2021   Pes planus 06/14/2021   Lymphedema 03/07/2021   Pharyngoesophageal dysphagia 11/26/2019   Bilateral temporomandibular joint pain  03/05/2019   Presbycusis of both ears 03/05/2019   Medication management 10/22/2018   History of pulmonary embolus (PE), 12/2017 10/22/2018   Chronic anticoagulation 10/22/2018   Tinea corporis 08/22/2018   Right leg swelling 08/21/2018   Pain and swelling of ankle, right 04/24/2018   Cough 04/13/2018   Throat pain in adult 03/10/2018   OSA (obstructive sleep apnea) 01/23/2018   Hypothyroidism    Hypoxia    Acute saddle pulmonary embolism without acute cor pulmonale ([place]) 12/29/2017   History of breast cancer 10/23/2017   Post-surgical hypothyroidism 06/27/2017   Thyroid goiter 02/26/2017   Personal history of noncompliance with medical treatment, presenting hazards to health 08/27/2016   Generalized anxiety disorder 08/09/2016   Abnormal TSH 05/09/2016   Abnormality of gait 01/31/2016   Low back pain 01/31/2016   Breast cancer of upper-outer quadrant of left female breast ([place]) 01/31/2016   Former heavy cigarette smoker (20-39 per day) 11/08/2015   Esophageal reflux    GERD (gastroesophageal reflux disease) 05/03/2015   Dependent edema 05/03/2015   Morbid obesity ([place]) 04/29/2015   Right carotid bruit 10/06/2014   Atypical chest pain 09/18/2014   Hypertension 09/18/2014   Hyperlipidemia 09/18/2014   Enlarged LA (left atrium)     Palliative Care Assessment & Plan    Assessment/Recommendations/Plan  Transition to total comfort care. Discontinue BiPAP, restraints, and medications not related to comfort. Start IV morphine infusion with continuous basal dosing of 2 mg/h and bolus dosing of 3 mg every 15 minute as needed.  Continue adjust based on patient's symptom burden.  Also starting IV Haldol and IV Ativan for regimen of agitation at the end of life.  Anticipate hospital death. Palliative to follow for family support and continued symptom management.  Code Status: DNR  Prognosis:  Hours - Days  Discharge Planning: Anticipated Hospital Death  Care plan was  discussed with attending, bedside nursing staff, family, and palliative care team.  Thank you for allowing the Palliative Medicine Team to assist in the care of this patient.  Signed by: Moss Mc, RN MSN [facility] / NP Student Palliative Medicine  If patient remains symptomatic despite maximum doses, please call PMT at [phone] between 0700 and 1900. Outside of these hours, please call attending, as PMT does not have night coverage.   I have reviewed, seen, examined, and discussed the care of this patient in detail with the Moss Mc, RN MSN [facility] / NP Student including pertinent patient records, physical exam findings, and data. I have updated the note accordingly and agree with details of the encounter stated in above note(GC).   Starting morphine basal and bolus infusion in the setting of comfort focused care. Family has agreed with this plan. In hospital death anticipated.  Chelsea Aus, DO Palliative Care Provider [phone]

## 2022-11-24 NOTE — Progress Notes (Signed)
  Daily Progress Note   Patient Name: Sheila Jacobs       Date:  DOB: [birth year]  Age: 72 y.o. MRN#: [id number] Attending Physician: Debbe Odea, MD Primary Care Physician: Arthur Holms, NP Admit Date: 11/04/2022 Length of Stay: 18 days  Will place full note when able. PMT providers discussed care with patient's daughter/NOK, Jeb Levering. Patient's medical has greatly deteriorated and in hospital death is now anticipated. Daughter agrees to transition to full comfort care at this time. Orders will be changed accordingly.   Chelsea Aus, DO Palliative Care Provider PMT # [phone]

## 2022-11-26 ENCOUNTER — Ambulatory Visit: Payer: 59

## 2022-11-27 ENCOUNTER — Ambulatory Visit: Payer: 59

## 2022-11-28 ENCOUNTER — Inpatient Hospital Stay: Payer: 59

## 2022-11-28 ENCOUNTER — Ambulatory Visit (HOSPITAL_COMMUNITY): Payer: Medicaid Other

## 2022-11-30 ENCOUNTER — Inpatient Hospital Stay: Payer: 59 | Admitting: Hematology and Oncology

## 2022-12-03 NOTE — Radiation Completion Notes (Signed)
Patient Name: Sheila Jacobs, Sheila Jacobs MRN: [id number] Date of Birth: 03-Aug-1951 Referring Physician: Marzetta Board, M.D. Date of Service: 2022-12-03 Radiation Oncologist: Teryl Lucy, M.D. [place] END OF TREATMENT NOTE     Diagnosis: C50.412 Malignant neoplasm of upper-outer quadrant of left female breast Staging on 2017-03-08: Breast cancer of upper-outer quadrant of left female breast ([place]) T=T1c, N=N0, M=cM0 Staging on 2017-03-08: Breast cancer of upper-outer quadrant of left female breast ([place]) T=T1b, N=N0, M=M0 Intent: Palliative     ==========DELIVERED PLANS==========  First Treatment Date: 2022-11-13 - Last Treatment Date: 2022-11-22   Plan Name: Brain Site: Brain Technique: Isodose Plan Mode: Photon Dose Per Fraction: 3 Gy Prescribed Dose (Delivered / Prescribed): 24 Gy / 30 Gy Prescribed Fxs (Delivered / Prescribed): 8 / 10     ==========ON TREATMENT VISIT DATES========== 2022-11-13, 2022-11-20     ==========UPCOMING VISITS==========       ==========APPENDIX - ON TREATMENT VISIT NOTES==========   See weekly On Treatment Notes is Epic for details.

## 2022-12-10 NOTE — Death Summary Note (Signed)
DEATH SUMMARY   Patient Details  Name: Sheila Jacobs MRN: [id number] DOB: 1951-08-25 [id number], Sheila Mercury, NP Admission/Discharge Information   Admit Date:  2022/11/13  Date of Death: Date of Death: Dec 02, 2022  Time of Death: Time of Death: 2200-12-16  Length of Stay: 12-05-22   Principle Cause of death: COVID infection  Hospital Diagnoses: Principal Problem:   Nausea and vomiting Active Problems: COVID infection with respiratory distress RLL pneumonia    Breast cancer of upper-outer quadrant of left female breast - Metastatic   Generalized anxiety disorder   Hypothyroidism   History of pulmonary embolus (PE), 12/2017   Chronic anticoagulation   Multiple pulmonary nodules   Hypokalemia   FTT (failure to thrive) in adult   Hyperkalemia   Hyponatremia     Need for emotional support   Goals of care, counseling/discussion   Palliative care by specialist   General weakness   DNR (do not resuscitate)   Counseling and coordination of care   End of life care   High risk medication use  Hypertension  Hospital Course:     Brief hospital course: This is a 72 year old female with hypertension, OSA, metastatic breast cancer to the brain.  She presented for shortness of breath and chest pain along with nausea and vomiting. Nausea and vomiting resolved in the hospital.  She was noted to be confused and an MRI of the brain was performed in the hospital and this revealed extensive parenchymal brain metastasis and leptomeningeal involvement.  Radiation was started for total of 10 days. Hospital course was complicated by agitation.   She was deaf and was communicating by reading our text and then speaking in response.    Assessment and Plan: Principal Problem:   Nausea and vomiting -Possibly secondary to brain mets-resolved   Active Problems: Severe congested cough. RLL pneumonia, COVID + - Chest x-ray today shows right lower lobe infiltrate and extensive metastatic disease - 3/13> started  cefepime, Tussionex and Nebs QID - not a candidate for Paxlovid due to being on Eliquis 3/15> more visibly short of breath and has tachypnea- moved to SDU and started on BiPAP - increased Decadron from 4 mg daily to 6 mig BID - unable to tolerate BiPAP- transitioned to comfort care- appreciate assistance from palliative care team   Intermittent agitation - PRN Ativan and Haldol     Breast cancer of upper-outer quadrant of left female breast stage IV - Has extensive metastatic disease in the lung liver and bone -was receiving palliative radiation and last radiation will be on 11/25/2021 after which family was interested in taking her home with hospice   H/o PE - on eliqius   The results of significant diagnostics from this hospitalization (including imaging, microbiology, ancillary and laboratory) are listed below for reference.   Significant Diagnostic Studies: CT Angio Chest Pulmonary Embolism (PE) W or WO Contrast  Result Date: 11/23/2022 CLINICAL DATA:  Shortness of breath EXAM: CT ANGIOGRAPHY CHEST WITH CONTRAST TECHNIQUE: Multidetector CT imaging of the chest was performed using the standard protocol during bolus administration of intravenous contrast. Multiplanar CT image reconstructions and MIPs were obtained to evaluate the vascular anatomy. RADIATION DOSE REDUCTION: This exam was performed according to the departmental dose-optimization program which includes automated exposure control, adjustment of the mA and/or kV according to patient size and/or use of iterative reconstruction technique. CONTRAST:  <See Chart> OMNIPAQUE IOHEXOL 350 MG/ML SOLN COMPARISON:  Chest x-ray 11/21/2022, chest CT 11/13/22, chest CT 08/12/2021, 10/31/2021 FINDINGS: Cardiovascular: Satisfactory opacification of  the pulmonary arteries to the proximal segmental level. No evidence of pulmonary embolism. Nondiagnostic for subsegmental emboli due to limited contrast opacification and motion artifact. Dilated  pulmonary trunk and main pulmonary arteries, pulmonary trunk measures 4.4 cm. Moderate aortic atherosclerosis. No aneurysm. Small pericardial effusion. Mild coronary vascular calcification Mediastinum/Nodes: Midline trachea. Multiple enlarged supraclavicular nodes. Similar mildly enlarged paratracheal and subcarinal nodes. Esophagus within normal limits. Slightly enlarged low right axillary lymph nodes measuring up to 12 mm, previously 10 mm. Lungs/Pleura: New moderate bilateral pleural effusions. Extensive pulmonary metastatic disease with interval development of widespread bilateral consolidations and heterogeneous ground-glass densities. Upper Abdomen: Probable slight increase in size of dominant hypodense metastatic liver lesion measuring 2.8 cm, previously 2.3 cm. Incompletely visualized bilateral adrenal masses/enlargement. Musculoskeletal: Diffuse skeletal metastatic disease as before. Similar scattered subcutaneous nodules within the back soft tissues. Review of the MIP images confirms the above findings. IMPRESSION: 1. Negative for acute pulmonary embolus to the proximal segmental level. Nondiagnostic for subsegmental emboli due to limited contrast opacification and motion artifact. 2. New moderate bilateral pleural effusions. Extensive pulmonary metastatic disease with interval development of widespread bilateral consolidations and heterogeneous ground-glass densities, findings could be secondary to multifocal pneumonia or pulmonary edema. 3. Dilated pulmonary trunk and main pulmonary arteries consistent with pulmonary hypertension. 4. Probable slight increase in size of dominant metastatic liver lesion. Incompletely visualized bilateral adrenal masses/enlargement. Widespread skeletal metastatic disease. Similar supraclavicular and mediastinal metastatic disease. 5. Aortic atherosclerosis. Aortic Atherosclerosis (ICD10-I70.0). Electronically Signed   By: Donavan Foil M.D.   On: 11/23/2022 23:51   DG  Chest Port 1 View  Result Date: 11/21/2022 CLINICAL DATA:  Cough EXAM: PORTABLE CHEST 1 VIEW COMPARISON:  CT 10/23/2022, radiograph 11/02/2022, radiograph 11/14/2021 FINDINGS: Unchanged cardiomediastinal silhouette. Diffuse nodular opacities compatible with known metastatic disease. There is increased airspace disease in the right upper lung. Bones are unchanged with known sclerotic osseous metastases. IMPRESSION: Diffuse pulmonary metastatic disease, with increased airspace disease in the right lung suspicious for superimposed pneumonia. Electronically Signed   By: Maurine Simmering M.D.   On: 11/21/2022 12:43   MR BRAIN W WO CONTRAST  Result Date: 11/07/2022 CLINICAL DATA:  Provided history: Altered mental status, nontraumatic. Acute metabolic encephalopathy. Rule out metastases. Additional history obtained from the [facility] breast cancer. EXAM: MRI HEAD WITHOUT AND WITH CONTRAST TECHNIQUE: Multiplanar, multiecho pulse sequences of the brain and surrounding structures were obtained without and with intravenous contrast. CONTRAST:  82mL GADAVIST GADOBUTROL 1 MMOL/ML IV SOLN COMPARISON:  Head CT 10/11/2022. Brain MRI 02/09/2022. FINDINGS: Mild intermittent motion degradation. Brain: No age advanced or lobar predominant parenchymal atrophy. There is multifocal abnormal dural thickening and enhancement compatible with dural metastatic disease, most notably as follows. Dural thickening and enhancement overlying the left frontal lobe, measuring up to 6 mm in thickness (series 11, image 27). Foci of underlying mild parenchymal edema within the left frontal lobe (for instance as seen on series 6, images 24 and 18). Additionally, there is curvilinear abnormal enhancement overlying portions of the anterior left frontal lobe suspicious for leptomeningeal metastatic disease (for instance as seen on series 10, image 26 (series 10, image 34). Dural thickening and enhancement overlying the  anteroinferior right frontal lobe, measuring up to 3 mm in thickness (for instance as seen on series 10, image 20). Subtle dural thickening and enhancement along the right aspect of the mid-to-posterior falx (for instance on the right on series 10, image 45) (for instance on the left on series 10, image 37).  Dural thickening and enhancement along the anterior aspect of the right temporal lobe, measuring up to 3 mm in thickness (for instance as seen on series 10, image 13). Contiguous abnormal enhancement compatible with metastatic tumor extends through right foramen ovale and into the right masticator space (series 351, image 36). Underlying parenchymal edema within the anterior right temporal lobe. 4 mm focus of dural thickening and enhancement along the mid-to-anterior right frontal lobe (series 10, image 37). There are numerous small enhancing parenchymal metastases within the supratentorial brain and cerebellum (at least 30 in number) (see annotations on series 10, image 26). The largest parenchymal lesion is located within the anterior left frontal lobe, measuring 5 mm (series 10, image 23). No more than mild edema associated with these lesions. Background mild multifocal T2 FLAIR hyperintense signal abnormality within the cerebral white matter, nonspecific but compatible with chronic small vessel ischemic disease. Small chronic cortical infarcts within the bilateral parietal lobes. Chronic microhemorrhage within the anterior left frontal lobe. Multiple small chronic infarcts in the bilateral cerebellar hemispheres. The are several subcentimeter foci of diffusion-weighted signal abnormality within the posterior left frontal lobe, right parietal lobe and right occipital lobe. These may reflect poorly enhancing metastases or small acute/early subacute embolic infarcts. No extra-axial fluid collection. No midline shift. Vascular: Maintained flow voids within the proximal large arterial vessels. Skull and upper  cervical spine: Multifocal signal abnormality and abnormal enhancement within the calvarium, skull base and visualized upper cervical spine compatible with osseous metastatic disease. Sinuses/Orbits: No mass or acute finding within the imaged orbits. Prior bilateral ocular lens replacement. T2 hyperintense opacification of the frontal sinuses, and of multiple anterior ethmoid air cells, bilaterally. Other: Bilateral mastoid effusions. These results will be called to the ordering clinician or representative by the Radiologist Assistant, and communication documented in the PACS or [employer]. IMPRESSION: 1. Multifocal dural thickening and enhancement within the supratentorial compartment compatible with dural-based metastatic disease. There are foci of parenchymal edema within the left frontal lobe and anterior right temporal lobe (underlying sites of dural-based tumor). Additionally, there is contiguous tumor extension from the right temporal region into the right foramen ovale and right masticator space. 2. Curvilinear enhancement along portions of the anterior left frontal lobe suspicious for leptomeningeal metastatic disease. 3. Numerous (at least 30) parenchymal metastases within the supratentorial brain and cerebellum. 4. The are several subcentimeter foci of diffusion-weighted signal abnormality within the posterior left frontal lobe, right parietal lobe and right occipital lobe. These may reflect poorly enhancing metastases or small acute/early subacute embolic infarcts. 5. Multifocal osseous metastatic disease within the calvarium, skull base and visualized upper cervical spine. 6. Background parenchymal atrophy, chronic small-vessel ischemic disease and chronic infarcts. 7. Paranasal sinus disease, as described. 8. Bilateral mastoid effusions. Electronically Signed   By: Kellie Simmering D.O.   On: 11/07/2022 11:39    Microbiology: No results found for this or any previous visit (from the past 240  hour(s)).  Time spent: 25 minutes  Signed: Debbe Odea, MD 12-06-2022

## 2022-12-10 NOTE — Plan of Care (Signed)
Pt comfort care, time of death 12-23-2200.

## 2022-12-10 DEATH — deceased

## 2022-12-27 ENCOUNTER — Ambulatory Visit: Payer: Self-pay | Admitting: Radiation Oncology

## 2023-02-01 ENCOUNTER — Ambulatory Visit: Payer: Medicaid Other

## 2023-02-01 ENCOUNTER — Other Ambulatory Visit: Payer: Medicaid Other

## 2023-02-01 ENCOUNTER — Ambulatory Visit: Payer: Medicaid Other | Admitting: Hematology and Oncology
